# Patient Record
Sex: Female | Born: 1945 | Race: Black or African American | Hispanic: No | Smoking: Former smoker
Health system: Southern US, Community
[De-identification: ages and names within clinical notes are randomized; demographics above are authoritative.]

## PROBLEM LIST (undated history)

## (undated) DIAGNOSIS — H409 Unspecified glaucoma: Secondary | ICD-10-CM

## (undated) DIAGNOSIS — F419 Anxiety disorder, unspecified: Secondary | ICD-10-CM

## (undated) DIAGNOSIS — E785 Hyperlipidemia, unspecified: Secondary | ICD-10-CM

## (undated) DIAGNOSIS — G4730 Sleep apnea, unspecified: Secondary | ICD-10-CM

## (undated) DIAGNOSIS — K219 Gastro-esophageal reflux disease without esophagitis: Secondary | ICD-10-CM

## (undated) DIAGNOSIS — E079 Disorder of thyroid, unspecified: Secondary | ICD-10-CM

## (undated) DIAGNOSIS — H269 Unspecified cataract: Secondary | ICD-10-CM

## (undated) DIAGNOSIS — Z803 Family history of malignant neoplasm of breast: Secondary | ICD-10-CM

## (undated) DIAGNOSIS — M1990 Unspecified osteoarthritis, unspecified site: Secondary | ICD-10-CM

## (undated) DIAGNOSIS — Z8042 Family history of malignant neoplasm of prostate: Secondary | ICD-10-CM

## (undated) DIAGNOSIS — D649 Anemia, unspecified: Secondary | ICD-10-CM

## (undated) DIAGNOSIS — E119 Type 2 diabetes mellitus without complications: Secondary | ICD-10-CM

## (undated) DIAGNOSIS — T7840XA Allergy, unspecified, initial encounter: Secondary | ICD-10-CM

## (undated) DIAGNOSIS — R7303 Prediabetes: Secondary | ICD-10-CM

## (undated) HISTORY — DX: Allergy, unspecified, initial encounter: T78.40XA

## (undated) HISTORY — DX: Family history of malignant neoplasm of prostate: Z80.42

## (undated) HISTORY — DX: Disorder of thyroid, unspecified: E07.9

## (undated) HISTORY — PX: EYE SURGERY: SHX253

## (undated) HISTORY — DX: Sleep apnea, unspecified: G47.30

## (undated) HISTORY — PX: ABDOMINAL HYSTERECTOMY: SHX81

## (undated) HISTORY — DX: Unspecified cataract: H26.9

## (undated) HISTORY — DX: Anxiety disorder, unspecified: F41.9

## (undated) HISTORY — PX: BREAST SURGERY: SHX581

## (undated) HISTORY — DX: Anemia, unspecified: D64.9

## (undated) HISTORY — PX: CHOLECYSTECTOMY: SHX55

## (undated) HISTORY — DX: Family history of malignant neoplasm of breast: Z80.3

## (undated) HISTORY — DX: Type 2 diabetes mellitus without complications: E11.9

## (undated) HISTORY — DX: Unspecified glaucoma: H40.9

## (undated) HISTORY — DX: Gastro-esophageal reflux disease without esophagitis: K21.9

## (undated) HISTORY — DX: Hyperlipidemia, unspecified: E78.5

---

## 1973-05-15 HISTORY — PX: TONSILLECTOMY: SUR1361

## 2009-05-15 HISTORY — PX: ESOPHAGOGASTRODUODENOSCOPY: SHX1529

## 2009-05-15 HISTORY — PX: COLONOSCOPY: SHX174

## 2011-01-20 ENCOUNTER — Encounter: Payer: Self-pay | Admitting: *Deleted

## 2011-01-20 ENCOUNTER — Other Ambulatory Visit: Payer: Self-pay

## 2011-01-20 ENCOUNTER — Emergency Department (HOSPITAL_COMMUNITY)
Admission: EM | Admit: 2011-01-20 | Discharge: 2011-01-20 | Disposition: A | Discharge status: Home / Self Care | Payer: Federal, State, Local not specified - PPO | Attending: Emergency Medicine | Admitting: Emergency Medicine

## 2011-01-20 ENCOUNTER — Emergency Department (HOSPITAL_COMMUNITY): Payer: Federal, State, Local not specified - PPO

## 2011-01-20 DIAGNOSIS — IMO0002 Reserved for concepts with insufficient information to code with codable children: Secondary | ICD-10-CM | POA: Insufficient documentation

## 2011-01-20 DIAGNOSIS — Y92009 Unspecified place in unspecified non-institutional (private) residence as the place of occurrence of the external cause: Secondary | ICD-10-CM | POA: Insufficient documentation

## 2011-01-20 DIAGNOSIS — I498 Other specified cardiac arrhythmias: Secondary | ICD-10-CM | POA: Insufficient documentation

## 2011-01-20 DIAGNOSIS — S20219A Contusion of unspecified front wall of thorax, initial encounter: Secondary | ICD-10-CM | POA: Insufficient documentation

## 2011-01-20 MED ORDER — NAPROXEN 500 MG PO TABS: 500.0000 mg | ORAL_TABLET | Freq: Two times a day (BID) | ORAL | Status: AC

## 2011-01-20 MED ORDER — NAPROXEN 250 MG PO TABS
500.0000 mg | ORAL_TABLET | Freq: Once | ORAL | Status: AC
  Administered 2011-01-20: 500 mg via ORAL
  Filled 2011-01-20: qty 2

## 2011-01-20 NOTE — ED Notes (Signed)
States she accidentally ran into the deck on her riding lawnmower today, c/o pain in chest area

## 2011-01-20 NOTE — ED Provider Notes (Signed)
History   Scribed for Kara Bailiff, MD, the patient was seen in room APA19/APA19. This chart was scribed by Clarita Crane. This patient's care was started at 8:21PM.   CSN: [id number] Arrival date & time: 01/20/2011  7:45 PM  Chief Complaint  Patient presents with  . Chest Injury   HPI Kara [name] is a 65 y.o. female who presents to the Emergency Department complaining of constant chest pain described as soreness following an incident in which patient was mowing the lawn and accidentally ran into her deck causing her chest to strike an overhanging beam approximately 1-2 hours ago. Patient also notes swelling and pain to anterior neck although she denies a direct injury/blow to neck. Notes chest pain is aggravated with palpation and relieved by nothing. Denies SOB, head injury, posterior neck pain, LOC, HA, blurred vision.   HPI ELEMENTS: Location: parasternal region  Onset: 1-2 hours ago Duration: persistent since onset  Timing: constant   Quality:soreness  Modifying factors: aggravated by palpation. Relieved by nothing  Context:  as above  Associated symptoms:  +Throat swelling/pain Denies SOB, head injury, LOC, HA, blurred vision.   PAST MEDICAL HISTORY:  History reviewed. No pertinent past medical history.  PAST SURGICAL HISTORY:  Past Surgical History  Procedure Date  . Cesarean section   . Abdominal hysterectomy   . Cholecystectomy     MEDICATIONS:  Previous Medications   ESTRADIOL (VIVELLE-DOT) 0.0375 MG/24HR    Place 1 patch onto the skin 2 (two) times a week.     LANSOPRAZOLE (PREVACID) 15 MG CAPSULE    Take 15 mg by mouth daily as needed. For relief    LEVOTHYROXINE (SYNTHROID, LEVOTHROID) 112 MCG TABLET    Take 112 mcg by mouth daily.       ALLERGIES:  Allergies as of 01/20/2011 - Review Complete 01/20/2011  Allergen Reaction Noted  . Other Anaphylaxis 01/20/2011     FAMILY HISTORY:  No family history on file.   SOCIAL HISTORY: History   Social History    . Marital Status: Single    Spouse Name: N/A    Number of Children: N/A  . Years of Education: N/A   Social History Main Topics  . Smoking status: None  . Smokeless tobacco: None  . Alcohol Use: No  . Drug Use:   . Sexually Active:    Other Topics Concern  . None   Social History Narrative  . None     Review of Systems 10 Systems reviewed and are negative for acute change except as noted in the HPI.  Physical Exam  BP 145/50  Pulse 120  Temp(Src) 97.8 F (36.6 C) (Oral)  Resp 20  Ht 5\' 6"  (1.676 m)  Wt 227 lb (102.967 kg)  BMI 36.64 kg/m2  SpO2 100%  Physical Exam  Nursing note and vitals reviewed. Constitutional: She is oriented to person, place, and time. She appears well-developed and well-nourished. No distress.  HENT:  Head: Normocephalic and atraumatic.  Eyes: EOM are normal.  Neck: Normal range of motion. Neck supple.       Anterior soft tissue tenderness.   Cardiovascular: Normal rate and regular rhythm.  Exam reveals no gallop and no friction rub.   No murmur heard. Pulmonary/Chest: Effort normal and breath sounds normal. She has no wheezes. She exhibits tenderness (upper chest wall anteriorly and parasternal).  Abdominal: Soft. Bowel sounds are normal. She exhibits no distension. There is no tenderness.  Musculoskeletal: Normal range of motion. She exhibits no edema.  No C-spine tenderness.   Neurological: She is alert and oriented to person, place, and time. No sensory deficit.  Skin: Skin is warm and dry.  Psychiatric: She has a normal mood and affect. Her behavior is normal.    ED Course  Procedures  OTHER DATA REVIEWED: Nursing notes, vital signs, and past medical records reviewed. Lab results reviewed and considered Imaging results reviewed and considered  DIAGNOSTIC STUDIES: Oxygen Saturation is 100% on room air, normal by my interpretation.    LABS / RADIOLOGY: No results found for this or any previous visit. Dg Neck Soft  Tissue  01/20/2011  *RADIOLOGY REPORT*  Clinical Data: Blunt injury, difficulty swallowing  NECK SOFT TISSUES - 1+ VIEW  Comparison: None.  Findings: The cervical vertebrae are straightened in alignment. There is anterior osteophyte formation at C4-5 and C5-6 levels.  No prevertebral soft tissue swelling is seen.  The epiglottis appears normal.  No air is noted within the prevertebral soft tissues.  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  No air in the soft tissues.  Original Report Authenticated By: Juline Patch, M.D.   Dg Chest 2 View  01/20/2011  *RADIOLOGY REPORT*  Clinical Data: Chest pain, shortness of breath  CHEST - 2 VIEW  Comparison: None.  Findings: The lungs are clear.  Mediastinal contours appear normal. The heart is within upper limits of normal.  No acute bony abnormality is seen.  Surgical clips are present in the right upper quadrant from prior cholecystectomy.  IMPRESSION: Borderline cardiomegaly.  No active lung disease.  Original Report Authenticated By: Juline Patch, M.D.    PROCEDURES:   Date: 01/20/2011  Rate: 114  Rhythm: sinus tachycardia  QRS Axis: normal  Intervals: normal  ST/T Wave abnormalities: normal  Conduction Disutrbances:none  Narrative Interpretation: tachy likely from pain  Old EKG Reviewed: none available  ED COURSE / COORDINATION OF CARE: Orders Placed This Encounter  Procedures  . DG Chest 2 View  . DG Neck Soft Tissue  9:24PM- Patient informed of imaging results and intent to d/c home. Patient agrees with plan set forth at this time.   MDM: Differential Diagnosis: chest wall contusion. Imaging was performed of the chest as well as the soft tissues of the lateral neck which were negative. She received a dose of anti-inflammatory medication and she drove here. She wishes to be discharged only on anti-inflammatory medications and she'll be driving back to her home town in [place]. Currently the  patient's initial tachycardia was secondary to pain and on reassessment normalized. She provided instructions for return to the emergency department. She will take Tylenol as well as Naprosyn for pain control. She is instructed to follow up with her primary care physician in one week and to apply ice 3 times daily for 20 minutes at a time.   PLAN: Discharge The patient is to return the emergency department if there is any worsening of symptoms. I have reviewed the discharge instructions with the patient/family  CONDITION ON DISCHARGE: Stable   MEDICATIONS GIVEN IN THE E.D.  Medications  levothyroxine (SYNTHROID, LEVOTHROID) 112 MCG tablet (not administered)  estradiol (VIVELLE-DOT) 0.0375 MG/24HR (not administered)  lansoprazole (PREVACID) 15 MG capsule (not administered)  naproxen (NAPROSYN) 500 MG tablet (not administered)  naproxen (NAPROSYN) tablet 500 mg (500 mg Oral Given 01/20/11 2053)      I personally performed the services described in this documentation, which was scribed in my presence. The recorded information has been reviewed and considered.  Kara Bailiff, MD 01/20/11 2159

## 2014-06-01 DIAGNOSIS — E119 Type 2 diabetes mellitus without complications: Secondary | ICD-10-CM | POA: Diagnosis not present

## 2014-06-01 DIAGNOSIS — E780 Pure hypercholesterolemia: Secondary | ICD-10-CM | POA: Diagnosis not present

## 2014-06-01 DIAGNOSIS — E559 Vitamin D deficiency, unspecified: Secondary | ICD-10-CM | POA: Diagnosis not present

## 2014-06-01 DIAGNOSIS — E890 Postprocedural hypothyroidism: Secondary | ICD-10-CM | POA: Diagnosis not present

## 2014-06-04 DIAGNOSIS — N3090 Cystitis, unspecified without hematuria: Secondary | ICD-10-CM | POA: Diagnosis not present

## 2014-06-04 DIAGNOSIS — R300 Dysuria: Secondary | ICD-10-CM | POA: Diagnosis not present

## 2014-06-04 DIAGNOSIS — N3941 Urge incontinence: Secondary | ICD-10-CM | POA: Diagnosis not present

## 2014-06-19 DIAGNOSIS — E1165 Type 2 diabetes mellitus with hyperglycemia: Secondary | ICD-10-CM | POA: Diagnosis not present

## 2014-06-19 DIAGNOSIS — E669 Obesity, unspecified: Secondary | ICD-10-CM | POA: Diagnosis not present

## 2014-06-19 DIAGNOSIS — E039 Hypothyroidism, unspecified: Secondary | ICD-10-CM | POA: Diagnosis not present

## 2014-06-19 DIAGNOSIS — K219 Gastro-esophageal reflux disease without esophagitis: Secondary | ICD-10-CM | POA: Diagnosis not present

## 2014-07-01 DIAGNOSIS — E559 Vitamin D deficiency, unspecified: Secondary | ICD-10-CM | POA: Diagnosis not present

## 2014-07-01 DIAGNOSIS — E780 Pure hypercholesterolemia: Secondary | ICD-10-CM | POA: Diagnosis not present

## 2014-07-01 DIAGNOSIS — E119 Type 2 diabetes mellitus without complications: Secondary | ICD-10-CM | POA: Diagnosis not present

## 2014-07-01 DIAGNOSIS — Z79899 Other long term (current) drug therapy: Secondary | ICD-10-CM | POA: Diagnosis not present

## 2014-07-01 DIAGNOSIS — E890 Postprocedural hypothyroidism: Secondary | ICD-10-CM | POA: Diagnosis not present

## 2014-07-03 DIAGNOSIS — E780 Pure hypercholesterolemia: Secondary | ICD-10-CM | POA: Diagnosis not present

## 2014-07-03 DIAGNOSIS — E559 Vitamin D deficiency, unspecified: Secondary | ICD-10-CM | POA: Diagnosis not present

## 2014-07-03 DIAGNOSIS — Z79899 Other long term (current) drug therapy: Secondary | ICD-10-CM | POA: Diagnosis not present

## 2014-07-03 DIAGNOSIS — E890 Postprocedural hypothyroidism: Secondary | ICD-10-CM | POA: Diagnosis not present

## 2014-07-03 DIAGNOSIS — E119 Type 2 diabetes mellitus without complications: Secondary | ICD-10-CM | POA: Diagnosis not present

## 2014-09-04 DIAGNOSIS — L821 Other seborrheic keratosis: Secondary | ICD-10-CM | POA: Diagnosis not present

## 2014-09-04 DIAGNOSIS — L820 Inflamed seborrheic keratosis: Secondary | ICD-10-CM | POA: Diagnosis not present

## 2014-09-30 DIAGNOSIS — E890 Postprocedural hypothyroidism: Secondary | ICD-10-CM | POA: Diagnosis not present

## 2014-09-30 DIAGNOSIS — E780 Pure hypercholesterolemia: Secondary | ICD-10-CM | POA: Diagnosis not present

## 2014-09-30 DIAGNOSIS — E119 Type 2 diabetes mellitus without complications: Secondary | ICD-10-CM | POA: Diagnosis not present

## 2014-10-02 DIAGNOSIS — Z79899 Other long term (current) drug therapy: Secondary | ICD-10-CM | POA: Diagnosis not present

## 2014-10-02 DIAGNOSIS — E559 Vitamin D deficiency, unspecified: Secondary | ICD-10-CM | POA: Diagnosis not present

## 2014-10-02 DIAGNOSIS — E780 Pure hypercholesterolemia: Secondary | ICD-10-CM | POA: Diagnosis not present

## 2014-10-02 DIAGNOSIS — E890 Postprocedural hypothyroidism: Secondary | ICD-10-CM | POA: Diagnosis not present

## 2014-10-02 DIAGNOSIS — E119 Type 2 diabetes mellitus without complications: Secondary | ICD-10-CM | POA: Diagnosis not present

## 2014-10-20 DIAGNOSIS — Z79899 Other long term (current) drug therapy: Secondary | ICD-10-CM | POA: Diagnosis not present

## 2014-10-20 DIAGNOSIS — E559 Vitamin D deficiency, unspecified: Secondary | ICD-10-CM | POA: Diagnosis not present

## 2014-10-20 DIAGNOSIS — E890 Postprocedural hypothyroidism: Secondary | ICD-10-CM | POA: Diagnosis not present

## 2014-10-20 DIAGNOSIS — E780 Pure hypercholesterolemia: Secondary | ICD-10-CM | POA: Diagnosis not present

## 2014-10-20 DIAGNOSIS — E119 Type 2 diabetes mellitus without complications: Secondary | ICD-10-CM | POA: Diagnosis not present

## 2014-10-27 DIAGNOSIS — E119 Type 2 diabetes mellitus without complications: Secondary | ICD-10-CM | POA: Diagnosis not present

## 2014-12-28 DIAGNOSIS — E119 Type 2 diabetes mellitus without complications: Secondary | ICD-10-CM | POA: Diagnosis not present

## 2014-12-30 DIAGNOSIS — E890 Postprocedural hypothyroidism: Secondary | ICD-10-CM | POA: Diagnosis not present

## 2014-12-30 DIAGNOSIS — E119 Type 2 diabetes mellitus without complications: Secondary | ICD-10-CM | POA: Diagnosis not present

## 2014-12-30 DIAGNOSIS — E780 Pure hypercholesterolemia: Secondary | ICD-10-CM | POA: Diagnosis not present

## 2015-01-01 DIAGNOSIS — E559 Vitamin D deficiency, unspecified: Secondary | ICD-10-CM | POA: Diagnosis not present

## 2015-01-01 DIAGNOSIS — E119 Type 2 diabetes mellitus without complications: Secondary | ICD-10-CM | POA: Diagnosis not present

## 2015-01-01 DIAGNOSIS — Z79899 Other long term (current) drug therapy: Secondary | ICD-10-CM | POA: Diagnosis not present

## 2015-01-01 DIAGNOSIS — E890 Postprocedural hypothyroidism: Secondary | ICD-10-CM | POA: Diagnosis not present

## 2015-01-01 DIAGNOSIS — E780 Pure hypercholesterolemia: Secondary | ICD-10-CM | POA: Diagnosis not present

## 2015-01-05 DIAGNOSIS — Z1231 Encounter for screening mammogram for malignant neoplasm of breast: Secondary | ICD-10-CM | POA: Diagnosis not present

## 2015-01-05 DIAGNOSIS — Z803 Family history of malignant neoplasm of breast: Secondary | ICD-10-CM | POA: Diagnosis not present

## 2015-01-06 DIAGNOSIS — M7730 Calcaneal spur, unspecified foot: Secondary | ICD-10-CM | POA: Diagnosis not present

## 2015-01-06 DIAGNOSIS — M722 Plantar fascial fibromatosis: Secondary | ICD-10-CM | POA: Diagnosis not present

## 2015-01-06 DIAGNOSIS — I890 Lymphedema, not elsewhere classified: Secondary | ICD-10-CM | POA: Diagnosis not present

## 2015-01-07 DIAGNOSIS — N952 Postmenopausal atrophic vaginitis: Secondary | ICD-10-CM | POA: Diagnosis not present

## 2015-01-08 DIAGNOSIS — Z780 Asymptomatic menopausal state: Secondary | ICD-10-CM | POA: Diagnosis not present

## 2015-02-15 ENCOUNTER — Emergency Department (HOSPITAL_COMMUNITY)
Admission: EM | Admit: 2015-02-15 | Discharge: 2015-02-15 | Disposition: A | Discharge status: Home / Self Care | Payer: Medicare Other | Attending: Emergency Medicine | Admitting: Emergency Medicine

## 2015-02-15 ENCOUNTER — Encounter (HOSPITAL_COMMUNITY): Payer: Self-pay | Admitting: Emergency Medicine

## 2015-02-15 DIAGNOSIS — Z79899 Other long term (current) drug therapy: Secondary | ICD-10-CM | POA: Insufficient documentation

## 2015-02-15 DIAGNOSIS — E119 Type 2 diabetes mellitus without complications: Secondary | ICD-10-CM | POA: Diagnosis not present

## 2015-02-15 DIAGNOSIS — H15001 Unspecified scleritis, right eye: Secondary | ICD-10-CM | POA: Diagnosis not present

## 2015-02-15 DIAGNOSIS — H5711 Ocular pain, right eye: Secondary | ICD-10-CM | POA: Diagnosis present

## 2015-02-15 LAB — SEDIMENTATION RATE: Sed Rate: 11 mm/hr (ref 0–22)

## 2015-02-15 MED ORDER — FLUORESCEIN SODIUM 1 MG OP STRP
1.0000 | ORAL_STRIP | Freq: Once | OPHTHALMIC | Status: AC
  Administered 2015-02-15: 1 via OPHTHALMIC
  Filled 2015-02-15: qty 1

## 2015-02-15 MED ORDER — PREDNISOLONE ACETATE 1 % OP SUSP: 1.0000 [drp] | Freq: Four times a day (QID) | OPHTHALMIC | Status: AC

## 2015-02-15 MED ORDER — KETOROLAC TROMETHAMINE 0.5 % OP SOLN: 1.0000 [drp] | Freq: Four times a day (QID) | OPHTHALMIC | Status: DC

## 2015-02-15 MED ORDER — TETRACAINE HCL 0.5 % OP SOLN
2.0000 [drp] | Freq: Once | OPHTHALMIC | Status: AC
  Administered 2015-02-15: 2 [drp] via OPHTHALMIC
  Filled 2015-02-15: qty 2

## 2015-02-15 NOTE — ED Notes (Signed)
Pt states no blurry vision or visual distortion with acuity, just "watery eyes".

## 2015-02-15 NOTE — Discharge Instructions (Signed)
Scleritis and Episcleritis Follow up with Dr. Iona Hansen or your own eye doctor within 2 days.  Use the drops as prescribed. Return to the ED if you develop new or worsening symptoms. The outer part of the eyeball is covered with a tough fibrous covering called the sclera. It is the white part of the eye. This tough covering also has a thin membrane lying on top of it called the episclera.   When the sclera becomes red and sore (inflamed), it is called scleritis.  When the episclera becomes inflamed, it is called episcleritis. CAUSES   Scleritis is usually more severe and is associated with autoimmune diseases such as:  Rheumatoid arthritis.  Inflammations of the bowel such as Crohn's Disease (regional enteritis).  Ulcerative colitis.  Episcleritis usually has no known cause. SYMPTOMS  Both scleritis and episcleritis cause red patches or a nodule on the eye. DIAGNOSIS  This condition should be examined by an ophthalmologist. This is because very strong medications that have side effects to the body and eye may be required to treat severe attacks. Further investigations into the patient's general health may be necessary. TREATMENT   Episcleritis tends to get better without treatment within a week or two.  Scleritis is more severe. Often, your caregiver will prescribe steroids by mouth (orally) or as drops in the eye. This treatment helps lessen the redness and soreness (inflammation). HOME CARE INSTRUCTIONS   Take all medications as directed.  Keep your follow-up appointments as directed.  Avoid irritation of the involved eye(s).  Stop using hard or soft contact lenses until your caregiver tells you that it is safe to use them. SEEK MEDICAL CARE IF:   Redness or irritation gets worse.  You develop pain or sensitivity to light.  You develop any change in vision in the involved eye(s). Document Released: 04/25/2001 Document Revised: 07/24/2011 Document Reviewed:  08/27/2008 [facility] Patient Information 2015 [place], [place]. This information is not intended to replace advice given to you by your health care provider. Make sure you discuss any questions you have with your health care provider.

## 2015-02-15 NOTE — ED Provider Notes (Signed)
CSN: [id number]     Arrival date & time 02/15/15  8413 History   First MD Initiated Contact with Patient 02/15/15 0446     Chief Complaint  Patient presents with  . Eye Drainage     (Consider location/radiation/quality/duration/timing/severity/associated sxs/prior Treatment) HPI Comments: Right eye pain and swelling with clear drainage is worsening since yesterday. Denies trauma. States she's had sinus problems for the past week the pain to the right eye started this morning across her entire right side of her face reading to her teeth. Denies any blurry vision or double vision and has had watery eyes. She is glasses but no contacts. No fever. No history of glaucoma but does have cataracts. Denies any cough or runny nose. Denies any headache. No chest pain or shortness of breath. Denies any foreign body sensation.  The history is provided by the patient.    History reviewed. No pertinent past medical history. Past Surgical History  Procedure Laterality Date  . Cesarean section    . Abdominal hysterectomy    . Cholecystectomy     History reviewed. No pertinent family history. Social History  Substance Use Topics  . Smoking status: Never Smoker   . Smokeless tobacco: None  . Alcohol Use: No   OB History    No data available     Review of Systems  Constitutional: Negative for fever, activity change and appetite change.  HENT: Negative for congestion and rhinorrhea.   Eyes: Positive for pain, discharge and redness. Negative for photophobia and visual disturbance.  Respiratory: Negative for cough, chest tightness and shortness of breath.   Cardiovascular: Negative for chest pain, palpitations and leg swelling.  Gastrointestinal: Negative for nausea, vomiting and abdominal pain.  Genitourinary: Negative for dysuria and hematuria.  Musculoskeletal: Negative for myalgias and arthralgias.  Skin: Negative for rash.  Neurological: Negative for dizziness and headaches.  A complete 10  system review of systems was obtained and all systems are negative except as noted in the HPI and PMH.      Allergies  Other  Home Medications   Prior to Admission medications   Medication Sig Start Date End Date Taking? Authorizing Provider  estradiol (VIVELLE-DOT) 0.0375 MG/24HR Place 1 patch onto the skin 2 (two) times a week.      Historical Provider, MD  ketorolac (ACULAR) 0.5 % ophthalmic solution Place 1 drop into the right eye every 6 (six) hours. 02/15/15   Ezequiel Essex, MD  lansoprazole (PREVACID) 15 MG capsule Take 15 mg by mouth daily as needed. For relief     Historical Provider, MD  levothyroxine (SYNTHROID, LEVOTHROID) 112 MCG tablet Take 112 mcg by mouth daily.      Historical Provider, MD  prednisoLONE acetate (PRED FORTE) 1 % ophthalmic suspension Place 1 drop into the right eye 4 (four) times daily. 02/15/15 02/17/15  Ezequiel Essex, MD   BP 136/62 mmHg  Pulse 75  Temp(Src) 97.7 F (36.5 C) (Oral)  Resp 16  Ht _0  (1.676 m)  Wt 203 lb (92.08 kg)  BMI 32.78 kg/m2  SpO2 99% Physical Exam  Constitutional: She is oriented to person, place, and time. She appears well-developed and well-nourished. No distress.  HENT:  Head: Normocephalic and atraumatic.  Mouth/Throat: Oropharynx is clear and moist. No oropharyngeal exudate.  Eyes: EOM are normal. Pupils are equal, round, and reactive to light. No foreign body present in the right eye. Right conjunctiva is injected.  Slit lamp exam:      The right eye  shows no corneal abrasion, no corneal flare, no corneal ulcer, no foreign body, no hyphema, no hypopyon, no fluorescein uptake and no anterior chamber bulge.  Intraocular pressure 15 bilaterally. No temporal artery tenderness. Injection of conjunctiva and sclera. No hyphema. No hypopyon. No areas of fluoroscein uptake.  No rash  Neck: Normal range of motion. Neck supple.  No meningismus.  Cardiovascular: Normal rate, regular rhythm, normal heart sounds and  intact distal pulses.   No murmur heard. Pulmonary/Chest: Effort normal and breath sounds normal. No respiratory distress.  Abdominal: Soft. There is no tenderness. There is no rebound and no guarding.  Musculoskeletal: Normal range of motion. She exhibits no edema or tenderness.  Neurological: She is alert and oriented to person, place, and time. No cranial nerve deficit. She exhibits normal muscle tone. Coordination normal.  No ataxia on finger to nose bilaterally. No pronator drift. 5/5 strength throughout. CN 2-12 intact. Negative Romberg. Equal grip strength. Sensation intact. Gait is normal.   Skin: Skin is warm.  Psychiatric: She has a normal mood and affect. Her behavior is normal.  Nursing note and vitals reviewed.   ED Course  Procedures (including critical care time) Labs Review Labs Reviewed  SEDIMENTATION RATE    Imaging Review No results found. I have personally reviewed and evaluated these images and lab results as part of my medical decision-making.   EKG Interpretation None      MDM   Final diagnoses:  Scleritis, right   right eye redness and pain for the past 2 days. No trauma.  Intraocular pressure is normal. No areas of corneal abrasion or fluoroscein uptake. No evidence of glaucoma. ESR normal. Doubt temporal arteritis.  Visual Acuity - Bilateral Near: 20/25 ; Bilateral Distance: 20/25 ; R Near: 20/25 ; R Distance: 20/25 ; L Near: 20/25 ; L Distance: 20/25  Discussed with Dr. Iona Hansen. He agrees with empiric treatment for scleritis and episcleritis. There is also possibility this could represent early zoster but there is no rash visible there is no evidence of forcing uptake at this time. Patient to follow up with her ophthalmologist or Dr. Iona Hansen within the next 2 days. He recommends pred forte drops and ketorolac drops.  D/w Patient. She agrees to followup with ophthalmology.  Return to the ED if she develops vision change, rash around her eye,  worsening pain or any other concerns.   Ezequiel Essex, MD 02/15/15 1326

## 2015-02-15 NOTE — ED Notes (Signed)
Pt states she has had sinus drainage for a week or "so" and has had pain behind her eys, but woke up this morning with drainage to right eye.

## 2015-02-24 DIAGNOSIS — J069 Acute upper respiratory infection, unspecified: Secondary | ICD-10-CM | POA: Diagnosis not present

## 2015-02-24 DIAGNOSIS — J329 Chronic sinusitis, unspecified: Secondary | ICD-10-CM | POA: Diagnosis not present

## 2015-03-01 DIAGNOSIS — G7000 Myasthenia gravis without (acute) exacerbation: Secondary | ICD-10-CM | POA: Diagnosis not present

## 2015-03-01 DIAGNOSIS — H43811 Vitreous degeneration, right eye: Secondary | ICD-10-CM | POA: Diagnosis not present

## 2015-03-01 DIAGNOSIS — H02831 Dermatochalasis of right upper eyelid: Secondary | ICD-10-CM | POA: Diagnosis not present

## 2015-03-01 DIAGNOSIS — H02834 Dermatochalasis of left upper eyelid: Secondary | ICD-10-CM | POA: Diagnosis not present

## 2015-03-05 DIAGNOSIS — H0264 Xanthelasma of left upper eyelid: Secondary | ICD-10-CM | POA: Diagnosis not present

## 2015-03-05 DIAGNOSIS — H0261 Xanthelasma of right upper eyelid: Secondary | ICD-10-CM | POA: Diagnosis not present

## 2015-03-08 DIAGNOSIS — E119 Type 2 diabetes mellitus without complications: Secondary | ICD-10-CM | POA: Diagnosis not present

## 2015-03-17 DIAGNOSIS — E119 Type 2 diabetes mellitus without complications: Secondary | ICD-10-CM | POA: Diagnosis not present

## 2015-04-01 DIAGNOSIS — E890 Postprocedural hypothyroidism: Secondary | ICD-10-CM | POA: Diagnosis not present

## 2015-04-01 DIAGNOSIS — E7800 Pure hypercholesterolemia, unspecified: Secondary | ICD-10-CM | POA: Diagnosis not present

## 2015-04-01 DIAGNOSIS — E559 Vitamin D deficiency, unspecified: Secondary | ICD-10-CM | POA: Diagnosis not present

## 2015-04-01 DIAGNOSIS — E119 Type 2 diabetes mellitus without complications: Secondary | ICD-10-CM | POA: Diagnosis not present

## 2015-04-05 DIAGNOSIS — E119 Type 2 diabetes mellitus without complications: Secondary | ICD-10-CM | POA: Diagnosis not present

## 2015-04-05 DIAGNOSIS — E7800 Pure hypercholesterolemia, unspecified: Secondary | ICD-10-CM | POA: Diagnosis not present

## 2015-04-05 DIAGNOSIS — Z79899 Other long term (current) drug therapy: Secondary | ICD-10-CM | POA: Diagnosis not present

## 2015-04-05 DIAGNOSIS — E559 Vitamin D deficiency, unspecified: Secondary | ICD-10-CM | POA: Diagnosis not present

## 2015-04-05 DIAGNOSIS — E890 Postprocedural hypothyroidism: Secondary | ICD-10-CM | POA: Diagnosis not present

## 2015-04-05 DIAGNOSIS — Z23 Encounter for immunization: Secondary | ICD-10-CM | POA: Diagnosis not present

## 2015-04-12 DIAGNOSIS — E119 Type 2 diabetes mellitus without complications: Secondary | ICD-10-CM | POA: Diagnosis not present

## 2015-04-12 DIAGNOSIS — H02401 Unspecified ptosis of right eyelid: Secondary | ICD-10-CM | POA: Diagnosis not present

## 2015-04-12 DIAGNOSIS — H02831 Dermatochalasis of right upper eyelid: Secondary | ICD-10-CM | POA: Diagnosis not present

## 2015-04-12 DIAGNOSIS — H02834 Dermatochalasis of left upper eyelid: Secondary | ICD-10-CM | POA: Diagnosis not present

## 2015-06-08 DIAGNOSIS — H608X3 Other otitis externa, bilateral: Secondary | ICD-10-CM | POA: Diagnosis not present

## 2015-07-05 DIAGNOSIS — E119 Type 2 diabetes mellitus without complications: Secondary | ICD-10-CM | POA: Diagnosis not present

## 2015-07-05 DIAGNOSIS — E7800 Pure hypercholesterolemia, unspecified: Secondary | ICD-10-CM | POA: Diagnosis not present

## 2015-07-05 DIAGNOSIS — E890 Postprocedural hypothyroidism: Secondary | ICD-10-CM | POA: Diagnosis not present

## 2015-07-05 DIAGNOSIS — E559 Vitamin D deficiency, unspecified: Secondary | ICD-10-CM | POA: Diagnosis not present

## 2015-07-07 DIAGNOSIS — Z79899 Other long term (current) drug therapy: Secondary | ICD-10-CM | POA: Diagnosis not present

## 2015-07-07 DIAGNOSIS — E890 Postprocedural hypothyroidism: Secondary | ICD-10-CM | POA: Diagnosis not present

## 2015-07-07 DIAGNOSIS — E559 Vitamin D deficiency, unspecified: Secondary | ICD-10-CM | POA: Diagnosis not present

## 2015-07-07 DIAGNOSIS — E119 Type 2 diabetes mellitus without complications: Secondary | ICD-10-CM | POA: Diagnosis not present

## 2015-07-07 DIAGNOSIS — E7800 Pure hypercholesterolemia, unspecified: Secondary | ICD-10-CM | POA: Diagnosis not present

## 2015-09-01 DIAGNOSIS — S30860A Insect bite (nonvenomous) of lower back and pelvis, initial encounter: Secondary | ICD-10-CM | POA: Diagnosis not present

## 2015-09-01 DIAGNOSIS — T7840XA Allergy, unspecified, initial encounter: Secondary | ICD-10-CM | POA: Diagnosis not present

## 2015-10-07 DIAGNOSIS — H25013 Cortical age-related cataract, bilateral: Secondary | ICD-10-CM | POA: Diagnosis not present

## 2015-11-04 DIAGNOSIS — E7800 Pure hypercholesterolemia, unspecified: Secondary | ICD-10-CM | POA: Diagnosis not present

## 2015-11-04 DIAGNOSIS — E119 Type 2 diabetes mellitus without complications: Secondary | ICD-10-CM | POA: Diagnosis not present

## 2015-11-04 DIAGNOSIS — E559 Vitamin D deficiency, unspecified: Secondary | ICD-10-CM | POA: Diagnosis not present

## 2015-11-04 DIAGNOSIS — Z79899 Other long term (current) drug therapy: Secondary | ICD-10-CM | POA: Diagnosis not present

## 2015-11-04 DIAGNOSIS — R0600 Dyspnea, unspecified: Secondary | ICD-10-CM | POA: Diagnosis not present

## 2015-11-04 DIAGNOSIS — E890 Postprocedural hypothyroidism: Secondary | ICD-10-CM | POA: Diagnosis not present

## 2015-11-09 ENCOUNTER — Encounter: Payer: Self-pay | Admitting: Obstetrics and Gynecology

## 2015-11-09 ENCOUNTER — Ambulatory Visit (INDEPENDENT_AMBULATORY_CARE_PROVIDER_SITE_OTHER): Payer: Medicare Other | Admitting: Obstetrics and Gynecology

## 2015-11-09 VITALS — BP 106/62 | HR 116 | Ht 66.0 in | Wt 215.0 lb

## 2015-11-09 DIAGNOSIS — N952 Postmenopausal atrophic vaginitis: Secondary | ICD-10-CM

## 2015-11-09 HISTORY — DX: Postmenopausal atrophic vaginitis: N95.2

## 2015-11-09 MED ORDER — ESTRADIOL 0.025 MG/24HR TD PTTW: 1.0000 | MEDICATED_PATCH | TRANSDERMAL | Status: DC

## 2015-11-09 NOTE — Progress Notes (Signed)
   [facility] Visit  @DATE @            Patient name: Kara [name] MRN [id number]  Date of birth: 1946-02-09  CC & HPI:  Kara [name] is a 70 y.o. female presenting today for hormone tx refil.  ROS:  ROS   Pertinent History Reviewed:   Reviewed: Significant for s.p hyst. Medical         Past Medical History  Diagnosis Date  . Diabetes mellitus without complication [facility])                               Surgical Hx:    Past Surgical History  Procedure Laterality Date  . Cesarean section    . Abdominal hysterectomy    . Cholecystectomy     Medications: Reviewed & Updated - see associated section                       Current outpatient prescriptions:  .  EPINEPHrine 0.3 mg/0.3 mL IJ SOAJ injection, Inject 0.3 mg as directed as needed. , Disp: , Rfl:  .  ketorolac (ACULAR) 0.5 % ophthalmic solution, Place 1 drop into the right eye every 6 (six) hours., Disp: 3 mL, Rfl: 0 .  lansoprazole (PREVACID) 15 MG capsule, Take 15 mg by mouth daily as needed. For relief , Disp: , Rfl:  .  levothyroxine (SYNTHROID, LEVOTHROID) 112 MCG tablet, Take 112 mcg by mouth daily.  , Disp: , Rfl:  .  ONETOUCH VERIO test strip, 1 each 2 (two) times daily. , Disp: , Rfl:  .  estradiol (VIVELLE-DOT) 0.025 MG/24HR, Place 1 patch onto the skin 2 (two) times a week., Disp: 8 patch, Rfl: 12 .  metFORMIN (GLUCOPHAGE-XR) 500 MG 24 hr tablet, Take 500 mg by mouth daily with breakfast. , Disp: , Rfl:    Social History: Reviewed -  reports that she has quit smoking. She does not have any smokeless tobacco history on file.  Objective Findings:  Vitals: Blood pressure 106/62, pulse 116, height 5\' 6"  (1.676 m), weight 215 lb (97.523 kg).  Physical Examination: General appearance - alert, well appearing, and in no distress, oriented to person, place, and time, overweight and anxious Mental status - alert, oriented to person, place, and time, normal mood, behavior, speech, dress, motor activity, and  thought processes,  Abdomen - soft, nontender, nondistended, no masses or organomegaly Physical Examination: Pelvic - normal external genitalia, vulva, vagina, cervix, uterus and adnexa, VULVA: normal appearing vulva with no masses, tenderness or lesions Vag well supported  Assessment & Plan:   A:  1. 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  P:  1. .vivelle dot 0.25

## 2015-11-10 ENCOUNTER — Telehealth: Payer: Self-pay | Admitting: Obstetrics and Gynecology

## 2015-12-13 ENCOUNTER — Other Ambulatory Visit: Payer: Self-pay | Admitting: Obstetrics and Gynecology

## 2015-12-13 DIAGNOSIS — Z780 Asymptomatic menopausal state: Secondary | ICD-10-CM

## 2015-12-13 MED ORDER — ESTRADIOL 0.0375 MG/24HR TD PTTW: 1.0000 | MEDICATED_PATCH | TRANSDERMAL | 12 refills | Status: DC

## 2016-01-05 DIAGNOSIS — E785 Hyperlipidemia, unspecified: Secondary | ICD-10-CM | POA: Diagnosis not present

## 2016-01-05 DIAGNOSIS — Z6835 Body mass index (BMI) 35.0-35.9, adult: Secondary | ICD-10-CM | POA: Diagnosis not present

## 2016-01-05 DIAGNOSIS — E119 Type 2 diabetes mellitus without complications: Secondary | ICD-10-CM | POA: Diagnosis not present

## 2016-01-05 DIAGNOSIS — R609 Edema, unspecified: Secondary | ICD-10-CM | POA: Diagnosis not present

## 2016-01-06 ENCOUNTER — Other Ambulatory Visit: Payer: Self-pay | Admitting: Family Medicine

## 2016-01-06 DIAGNOSIS — Z1231 Encounter for screening mammogram for malignant neoplasm of breast: Secondary | ICD-10-CM

## 2016-02-08 ENCOUNTER — Ambulatory Visit (INDEPENDENT_AMBULATORY_CARE_PROVIDER_SITE_OTHER): Payer: Medicare Other | Admitting: Obstetrics and Gynecology

## 2016-02-08 ENCOUNTER — Encounter: Payer: Self-pay | Admitting: Obstetrics and Gynecology

## 2016-02-08 DIAGNOSIS — N951 Menopausal and female climacteric states: Secondary | ICD-10-CM

## 2016-02-08 MED ORDER — ESTRADIOL 0.0375 MG/24HR TD PTTW: 1.0000 | MEDICATED_PATCH | TRANSDERMAL | 12 refills | Status: DC

## 2016-02-08 NOTE — Progress Notes (Signed)
[facility] [facility] Visit  02/08/16           Patient name: Kara [name] MRN [id number]  Date of birth: 09/11/1945  CC & HPI:  Kara [name] is a 70 y.o. female presenting today to discuss hormone medication. Patient was seen in the office on 10/2015 and was started on Vivelle Dot 0.25mg  which she says isn't enough for her. She states the lower dose causes her to feel mentally drained.  She states she would like to increase her dosage.   ROS:  ROS A complete 10 system review of systems was obtained and all systems are negative except as noted in the HPI and PMH.    Pertinent History Reviewed:   Reviewed: Significant for abdominal hysterectomy  Medical         Past Medical History:  Diagnosis Date  . Diabetes mellitus without complication [facility])                               Surgical Hx:    Past Surgical History:  Procedure Laterality Date  . ABDOMINAL HYSTERECTOMY    . CESAREAN SECTION    . CHOLECYSTECTOMY     Medications: Reviewed & Updated - see associated section                       Current Outpatient Prescriptions:  .  EPINEPHrine 0.3 mg/0.3 mL IJ SOAJ injection, Inject 0.3 mg as directed as needed. , Disp: , Rfl:  .  estradiol (VIVELLE-DOT) 0.0375 MG/24HR, Place 1 patch onto the skin 2 (two) times a week., Disp: 8 patch, Rfl: 12 .  ketorolac (ACULAR) 0.5 % ophthalmic solution, Place 1 drop into the right eye every 6 (six) hours., Disp: 3 mL, Rfl: 0 .  lansoprazole (PREVACID) 15 MG capsule, Take 15 mg by mouth daily as needed. For relief , Disp: , Rfl:  .  levothyroxine (SYNTHROID, LEVOTHROID) 112 MCG tablet, Take 112 mcg by mouth daily.  , Disp: , Rfl:  .  metFORMIN (GLUCOPHAGE-XR) 500 MG 24 hr tablet, Take 500 mg by mouth daily with breakfast. , Disp: , Rfl:  .  ONETOUCH VERIO test strip, 1 each 2 (two) times daily. , Disp: , Rfl:    Social History: Reviewed -  reports that she has quit smoking. She has never used smokeless tobacco.  Objective Findings:  Vitals:  Blood pressure 112/74, pulse 90, height 5\' 6"  (1.676 m), weight 216 lb 3.2 oz (98.1 kg).  Physical Examination: General appearance - alert, well appearing, and in no distress and oriented to person, place, and time Mental status - alert, oriented to person, place, and time, normal mood, behavior, speech, dress, motor activity, and thought processes Pelvic - examination not indicated  The provider spent over 25 minutes with the visit , including previsit review, and documentation,with >than 50% spent in counseling and coordination of care.   Assessment & Plan:   A:  1. Postmenopausal vasomotor symptoms, primarily emotional component   P:  1. Increased dose to vivelle dot 0.375    By signing my name below, I, Sonum Patel, attest that this documentation has been prepared under the direction and in the presence of Jonnie Kind, MD. Electronically Signed: Sonum Patel, Education administrator. 02/08/16. 2:27 PM.  .jfs I personally performed the services described in this documentation, which was SCRIBED in my presence. The recorded information has been reviewed and considered accurate. It  has been edited as necessary during review. Jonnie Kind, MD

## 2016-02-09 ENCOUNTER — Ambulatory Visit
Admission: RE | Admit: 2016-02-09 | Discharge: 2016-02-09 | Disposition: A | Discharge status: Home / Self Care | Payer: Medicare Other | Source: Ambulatory Visit | Attending: Family Medicine | Admitting: Family Medicine

## 2016-02-09 DIAGNOSIS — E119 Type 2 diabetes mellitus without complications: Secondary | ICD-10-CM | POA: Diagnosis not present

## 2016-02-09 DIAGNOSIS — Z1231 Encounter for screening mammogram for malignant neoplasm of breast: Secondary | ICD-10-CM

## 2016-02-09 DIAGNOSIS — E890 Postprocedural hypothyroidism: Secondary | ICD-10-CM | POA: Diagnosis not present

## 2016-02-09 DIAGNOSIS — E559 Vitamin D deficiency, unspecified: Secondary | ICD-10-CM | POA: Diagnosis not present

## 2016-02-09 DIAGNOSIS — E7800 Pure hypercholesterolemia, unspecified: Secondary | ICD-10-CM | POA: Diagnosis not present

## 2016-02-11 ENCOUNTER — Ambulatory Visit: Payer: Federal, State, Local not specified - PPO

## 2016-02-11 DIAGNOSIS — N951 Menopausal and female climacteric states: Secondary | ICD-10-CM | POA: Insufficient documentation

## 2016-02-11 HISTORY — DX: Menopausal and female climacteric states: N95.1

## 2016-02-14 DIAGNOSIS — E559 Vitamin D deficiency, unspecified: Secondary | ICD-10-CM | POA: Diagnosis not present

## 2016-02-14 DIAGNOSIS — E890 Postprocedural hypothyroidism: Secondary | ICD-10-CM | POA: Diagnosis not present

## 2016-02-14 DIAGNOSIS — R0600 Dyspnea, unspecified: Secondary | ICD-10-CM | POA: Diagnosis not present

## 2016-02-14 DIAGNOSIS — Z23 Encounter for immunization: Secondary | ICD-10-CM | POA: Diagnosis not present

## 2016-02-14 DIAGNOSIS — E7800 Pure hypercholesterolemia, unspecified: Secondary | ICD-10-CM | POA: Diagnosis not present

## 2016-02-14 DIAGNOSIS — E119 Type 2 diabetes mellitus without complications: Secondary | ICD-10-CM | POA: Diagnosis not present

## 2016-02-14 DIAGNOSIS — R609 Edema, unspecified: Secondary | ICD-10-CM | POA: Diagnosis not present

## 2016-02-14 DIAGNOSIS — Z79899 Other long term (current) drug therapy: Secondary | ICD-10-CM | POA: Diagnosis not present

## 2016-07-05 ENCOUNTER — Ambulatory Visit
Admission: RE | Admit: 2016-07-05 | Discharge: 2016-07-05 | Disposition: A | Discharge status: Home / Self Care | Payer: Federal, State, Local not specified - PPO | Source: Ambulatory Visit | Attending: Family Medicine | Admitting: Family Medicine

## 2016-07-05 ENCOUNTER — Other Ambulatory Visit: Payer: Self-pay | Admitting: Family Medicine

## 2016-07-05 DIAGNOSIS — R0789 Other chest pain: Secondary | ICD-10-CM | POA: Diagnosis not present

## 2016-07-05 DIAGNOSIS — R0781 Pleurodynia: Secondary | ICD-10-CM

## 2016-07-05 DIAGNOSIS — Z23 Encounter for immunization: Secondary | ICD-10-CM | POA: Diagnosis not present

## 2016-07-05 DIAGNOSIS — E039 Hypothyroidism, unspecified: Secondary | ICD-10-CM | POA: Diagnosis not present

## 2016-07-05 DIAGNOSIS — Z6835 Body mass index (BMI) 35.0-35.9, adult: Secondary | ICD-10-CM | POA: Diagnosis not present

## 2016-07-05 DIAGNOSIS — E785 Hyperlipidemia, unspecified: Secondary | ICD-10-CM | POA: Diagnosis not present

## 2016-07-05 DIAGNOSIS — Z79890 Hormone replacement therapy (postmenopausal): Secondary | ICD-10-CM | POA: Diagnosis not present

## 2016-08-09 DIAGNOSIS — E559 Vitamin D deficiency, unspecified: Secondary | ICD-10-CM | POA: Diagnosis not present

## 2016-08-09 DIAGNOSIS — E7800 Pure hypercholesterolemia, unspecified: Secondary | ICD-10-CM | POA: Diagnosis not present

## 2016-08-09 DIAGNOSIS — E119 Type 2 diabetes mellitus without complications: Secondary | ICD-10-CM | POA: Diagnosis not present

## 2016-08-09 DIAGNOSIS — E890 Postprocedural hypothyroidism: Secondary | ICD-10-CM | POA: Diagnosis not present

## 2016-08-14 DIAGNOSIS — E039 Hypothyroidism, unspecified: Secondary | ICD-10-CM

## 2016-08-14 DIAGNOSIS — K219 Gastro-esophageal reflux disease without esophagitis: Secondary | ICD-10-CM | POA: Insufficient documentation

## 2016-08-14 HISTORY — DX: Hypothyroidism, unspecified: E03.9

## 2016-08-15 ENCOUNTER — Ambulatory Visit: Payer: Federal, State, Local not specified - PPO | Admitting: Family Medicine

## 2016-08-23 ENCOUNTER — Encounter: Payer: Self-pay | Admitting: Family Medicine

## 2016-08-23 ENCOUNTER — Ambulatory Visit (INDEPENDENT_AMBULATORY_CARE_PROVIDER_SITE_OTHER): Payer: Medicare Other | Admitting: Family Medicine

## 2016-08-23 VITALS — BP 104/68 | HR 84 | Temp 96.5°F | Resp 16 | Ht 66.0 in | Wt 219.0 lb

## 2016-08-23 DIAGNOSIS — Z809 Family history of malignant neoplasm, unspecified: Secondary | ICD-10-CM

## 2016-08-23 DIAGNOSIS — IMO0001 Reserved for inherently not codable concepts without codable children: Secondary | ICD-10-CM

## 2016-08-23 DIAGNOSIS — E785 Hyperlipidemia, unspecified: Secondary | ICD-10-CM

## 2016-08-23 DIAGNOSIS — K635 Polyp of colon: Secondary | ICD-10-CM | POA: Insufficient documentation

## 2016-08-23 DIAGNOSIS — E119 Type 2 diabetes mellitus without complications: Secondary | ICD-10-CM

## 2016-08-23 DIAGNOSIS — E669 Obesity, unspecified: Secondary | ICD-10-CM | POA: Diagnosis not present

## 2016-08-23 DIAGNOSIS — Z6835 Body mass index (BMI) 35.0-35.9, adult: Secondary | ICD-10-CM | POA: Insufficient documentation

## 2016-08-23 DIAGNOSIS — E661 Drug-induced obesity: Secondary | ICD-10-CM | POA: Insufficient documentation

## 2016-08-23 DIAGNOSIS — E782 Mixed hyperlipidemia: Secondary | ICD-10-CM | POA: Insufficient documentation

## 2016-08-23 HISTORY — DX: Hyperlipidemia, unspecified: E78.5

## 2016-08-23 HISTORY — DX: Polyp of colon: K63.5

## 2016-08-23 HISTORY — DX: Family history of malignant neoplasm, unspecified: Z80.9

## 2016-08-23 NOTE — Patient Instructions (Signed)
Stop the metformin Be more careful with your diet Walk every day that you are able See the diabetes educator  We will set up a consult for colon cancer screening DEXA and mammo not due till Sept  See me in 3 months Need blood work next time

## 2016-08-23 NOTE — Progress Notes (Signed)
Chief Complaint  Patient presents with  . Establish Care   New to establish No old records Has seen endocrinologist in [place] is up to date with health maintenance except for needed repeat colonoscopy Recent labs reviewed her Hemoglobin A1c is 5.6 She states her A1c was 5.9 when diagnosed with diabetes and placed on medication. We discussed that she may be able to manage her sugar with lifestyle and I recommend she go to a diabetes educator and assertively try to modify diet and exercise to get off of meds. Her LDL is 113, never has been on a statin. Her thyroid is over corrected.  She tells me she is taking more than her last doctor recommended because she felt better.  I lt her know that over correction is dangerous with health consequences and she will reduce dose She has an epi pen for food allergies  Patient Active Problem List   Diagnosis Date Noted  . Colon polyps 08/23/2016  . Family history of cancer 08/23/2016  . HLD (hyperlipidemia) 08/23/2016  . Obesity, Class II, BMI 35-39.9, with comorbidity 08/23/2016  . GERD (gastroesophageal reflux disease) 08/14/2016  . Hypothyroid 08/14/2016  . Type 2 diabetes mellitus without complication, without long-term current use of insulin ([place]) 08/14/2016  . Perimenopausal vasomotor symptoms 02/11/2016  . Postmenopausal atrophic vaginitis 11/09/2015    Outpatient Encounter Prescriptions as of 08/23/2016  Medication Sig  . EPINEPHrine 0.3 mg/0.3 mL IJ SOAJ injection Inject 0.3 mg as directed as needed.   Marland Kitchen estradiol (VIVELLE-DOT) 0.0375 MG/24HR Place 1 patch onto the skin 2 (two) times a week.  . lansoprazole (PREVACID) 15 MG capsule Take 15 mg by mouth daily as needed. For relief   . levothyroxine (SYNTHROID, LEVOTHROID) 112 MCG tablet Take 112 mcg by mouth daily.    . metFORMIN (GLUCOPHAGE-XR) 500 MG 24 hr tablet Take 500 mg by mouth daily with breakfast.   . ONETOUCH VERIO test strip 1 each 2 (two) times daily.    No  facility-administered encounter medications on file as of 08/23/2016.     Past Medical History:  Diagnosis Date  . Allergy    food allergies, drug allergies  . Anemia   . Anxiety   . Cataract   . Diabetes mellitus without complication ([place])   . GERD (gastroesophageal reflux disease)   . Hyperlipidemia   . Hypertension   . Thyroid disease     Past Surgical History:  Procedure Laterality Date  . ABDOMINAL HYSTERECTOMY     bleeding. fibroids  . CESAREAN SECTION    . CHOLECYSTECTOMY    . TONSILLECTOMY  1975    Social History   Social History  . Marital status: Divorced    Spouse name: N/A  . Number of children: 2  . Years of education: 15   Occupational History  . retired     Environmental health practitioner at [employer]   Social History Main Topics  . Smoking status: Former Research scientist (life sciences)  . Smokeless tobacco: Never Used     Comment: quit 2002  . Alcohol use No  . Drug use: No  . Sexual activity: Not Currently   Other Topics Concern  . 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 Cancer Maternal Grandmother  breast  . Cancer Maternal Grandfather   . Cancer Paternal Grandmother   . Cancer Paternal Grandfather   . Arthritis Mother   . Cancer Mother     breast  . COPD Mother   . Cancer Father     throat  . Stroke Father     Review of Systems  Constitutional: Negative for chills, fever and weight loss.  HENT: Negative for congestion and hearing loss.   Eyes: Negative for blurred vision and pain.  Respiratory: Negative for cough and shortness of breath.   Cardiovascular: Negative for chest pain and leg swelling.  Gastrointestinal: Negative for abdominal pain, constipation, diarrhea and heartburn.  Genitourinary: Negative for dysuria and frequency.  Musculoskeletal: Negative for falls, joint pain and  myalgias.  Neurological: Negative for dizziness, seizures and headaches.  Psychiatric/Behavioral: Negative for depression. The patient is not nervous/anxious and does not have insomnia.     BP 104/68 (BP Location: Right Arm, Patient Position: Sitting, Cuff Size: Large)   Pulse 84   Temp (!) 96.5 F (35.8 C) (Temporal)   Resp 16   Ht 5\' 6"  (1.676 m)   Wt 219 lb (99.3 kg)   SpO2 99%   BMI 35.35 kg/m   Physical Exam  Constitutional: She is oriented to person, place, and time. She appears well-developed and well-nourished.  HENT:  Head: Normocephalic and atraumatic.  Right Ear: External ear normal.  Left Ear: External ear normal.  Mouth/Throat: Oropharynx is clear and moist.  Eyes: Conjunctivae are normal. Pupils are equal, round, and reactive to light.  Neck: Normal range of motion. Neck supple. No thyromegaly present.  Cardiovascular: Normal rate, regular rhythm and normal heart sounds.   Pulmonary/Chest: Effort normal and breath sounds normal. No respiratory distress.  Abdominal: Soft. Bowel sounds are normal.  Musculoskeletal: Normal range of motion. She exhibits no edema.  Lymphadenopathy:    She has no cervical adenopathy.  Neurological: She is alert and oriented to person, place, and time.  Gait normal  Skin: Skin is warm and dry.  Psychiatric: She has a normal mood and affect. Her behavior is normal. Thought content normal.  Nursing note and vitals reviewed. ASSESSMENT/PLAN:   1. Polyp of colon, unspecified part of colon, unspecified type  - Ambulatory referral to Gastroenterology  2. Type 2 diabetes mellitus without complication, without long-term current use of insulin (H[facility])  - CBC - Hemoglobin A1c - Lipid panel - Urinalysis, Routine w reflex microscopic - Comprehensive metabolic panel - Ambulatory referral to diabetic education  3. Family history of cancer   4. Hyperlipidemia, unspecified hyperlipidemia type discussed diet and exercise  5. Obesity,  Class II, BMI 35-39.9, with comorbidity Goal is to reduce by 10 lbs in 3 months.  Diabetic, low fat diet, portion control, daily exercise   Patient Instructions  Stop the metformin Be more careful with your diet Walk every day that you are able See the diabetes educator  We will set up a consult for colon cancer screening DEXA and mammo not due till Sept  See me in 3 months Need blood work next time    Raylene Everts, MD

## 2016-09-04 ENCOUNTER — Encounter: Payer: Self-pay | Admitting: Internal Medicine

## 2016-09-11 ENCOUNTER — Encounter: Payer: Medicare Other | Attending: Family Medicine | Admitting: Nutrition

## 2016-09-11 VITALS — Ht 66.0 in | Wt 222.0 lb

## 2016-09-11 DIAGNOSIS — E119 Type 2 diabetes mellitus without complications: Secondary | ICD-10-CM

## 2016-09-11 DIAGNOSIS — Z713 Dietary counseling and surveillance: Secondary | ICD-10-CM | POA: Insufficient documentation

## 2016-09-11 NOTE — Patient Instructions (Signed)
Goals . Follow Plate Method Cut out diet sodas Eat meals on time Increase steps 6000 per day

## 2016-09-11 NOTE — Progress Notes (Signed)
Diabetes Self-Management Education  Visit Type: First/Initial  Appt. Start Time: 0800 Appt. End Time: 900  09/11/2016  [name], identified by name and date of birth, is a 71 y.o. female with a diagnosis of Diabetes: Type 2. LIves with her mom and cares for her. A1C 5.6%. No longer taking Metformin. Had lost about 30 lbs doing [employer] and only eating 20 grams of carbs per day and now has regained about 15 lbs. Walks for exercise. Has a fitbit and trying to get back to 10,000 steps. Only eating 2 meals per day. Sleeps in late.  Diet is inconsistent to meet her needs. She is motivated to make changes to eat better and still control her DM and work on weight loss  ASSESSMENT  Height 5\' 6"  (1.676 m), weight 222 lb (100.7 kg). Body mass index is 35.83 kg/m.      Diabetes Self-Management Education - 09/11/16 0758      Visit Information   Visit Type First/Initial     Initial Visit   Diabetes Type Type 2   Are you currently following a meal plan? No   Are you taking your medications as prescribed? Not on Medications   Date Diagnosed 2015     Health Coping   How would you rate your overall health? Good     Psychosocial Assessment   Patient Belief/Attitude about Diabetes Motivated to manage diabetes   Self-management support Family   Other persons present Patient   Patient Concerns Nutrition/Meal planning;Monitoring;Healthy Lifestyle;Problem Solving;Weight Control   Special Needs None   Preferred Learning Style No preference indicated   Learning Readiness Change in progress   How often do you need to have someone help you when you read instructions, pamphlets, or other written materials from your doctor or pharmacy? 1 - Never   What is the last grade level you completed in school? 12     Pre-Education Assessment   Patient understands the diabetes disease and treatment process. Needs Review   Patient understands incorporating nutritional management into lifestyle.  Needs Review   Patient undertands incorporating physical activity into lifestyle. Needs Review   Patient understands using medications safely. Needs Review   Patient understands monitoring blood glucose, interpreting and using results Needs Review   Patient understands prevention, detection, and treatment of acute complications. Needs Review   Patient understands prevention, detection, and treatment of chronic complications. Needs Review   Patient understands how to develop strategies to address psychosocial issues. Needs Review   Patient understands how to develop strategies to promote health/change behavior. Needs Review     Complications   Last HgB A1C per patient/outside source 5.6 %   How often do you check your blood sugar? 1-2 times/day   Have you had a dilated eye exam in the past 12 months? Yes   Have you had a dental exam in the past 12 months? Yes   Are you checking your feet? Yes   How many days per week are you checking your feet? 7     Dietary Intake   Breakfast Oatmeal, 1-2 sausage patty and 2 eggs,, coffee-non dairy creamer and splenda   Snack (morning) none   Lunch chicken sandwich-fried, [employer], Diet Coke   Dinner Spare ribs, BBQ sauce, potato salad,  Diet Coke 1/2 c ice cream   Snack (evening) 2 gam crackers,    Beverage(s) Diet Sodas,  water-1 bottle with lemon     Exercise   Exercise Type Light (walking / raking leaves)  How many days per week to you exercise? 30   How many minutes per day do you exercise? 5   Total minutes per week of exercise 150     Patient Education   Disease state  Definition of diabetes, type 1 and 2, and the diagnosis of diabetes;Factors that contribute to the development of diabetes;Explored patient's options for treatment of their diabetes   Nutrition management  Role of diet in the treatment of diabetes and the relationship between the three main macronutrients and blood glucose level;Food label reading, portion sizes and measuring  food.;Carbohydrate counting;Effects of alcohol on blood glucose and safety factors with consumption of alcohol.;Information on hints to eating out and maintain blood glucose control.;Meal options for control of blood glucose level and chronic complications.   Physical activity and exercise  Role of exercise on diabetes management, blood pressure control and cardiac health.;Identified with patient nutritional and/or medication changes necessary with exercise.;Helped patient identify appropriate exercises in relation to his/her diabetes, diabetes complications and other health issue.   Medications Taught/reviewed insulin injection, site rotation, insulin storage and needle disposal.;Reviewed patients medication for diabetes, action, purpose, timing of dose and side effects.;Reviewed medication adjustment guidelines for hyperglycemia and sick days.   Monitoring Taught/evaluated SMBG meter.;Purpose and frequency of SMBG.;Taught/discussed recording of test results and interpretation of SMBG.;Interpreting lab values - A1C, lipid, urine microalbumina.;Identified appropriate SMBG and/or A1C goals.   Acute complications Trained/discussed glucagon administration to patient and designated other.;Taught treatment of hypoglycemia - the 15 rule.;Discussed and identified patients' treatment of hyperglycemia.;Covered sick day management with medication and food.   Chronic complications Relationship between chronic complications and blood glucose control;Assessed and discussed foot care and prevention of foot problems;Lipid levels, blood glucose control and heart disease;Identified and discussed with patient  current chronic complications;Retinopathy and reason for yearly dilated eye exams;Nephropathy, what it is, prevention of, the use of ACE, ARB's and early detection of through urine microalbumia.;Reviewed with patient heart disease, higher risk of, and prevention   Psychosocial adjustment Worked with patient to identify  barriers to care and solutions;Role of stress on diabetes;Helped patient identify a support system for diabetes management;Travel strategies;Identified and addressed patients feelings and concerns about diabetes;Brainstormed with patient on coping mechanisms for social situations, getting support from significant others, dealing with feelings about diabetes   Personal strategies to promote health Lifestyle issues that need to be addressed for better diabetes care;Review risk of smoking and offered smoking cessation;Helped patient develop diabetes management plan for (enter comment)     Individualized Goals (developed by patient)   Nutrition Follow meal plan discussed;General guidelines for healthy choices and portions discussed;Adjust meds/carbs with exercise as discussed   Physical Activity Exercise 3-5 times per week;60 minutes per day   Medications Not Applicable   Monitoring  Not Applicable   Reducing Risk do foot checks daily;treat hypoglycemia with 15 grams of carbs if blood glucose less than 70mg /dL     Post-Education Assessment   Patient understands the diabetes disease and treatment process. Demonstrates understanding / competency   Patient understands incorporating nutritional management into lifestyle. Needs Review   Patient undertands incorporating physical activity into lifestyle. Needs Review   Patient understands using medications safely. Demonstrates understanding / competency   Patient understands monitoring blood glucose, interpreting and using results Demonstrates understanding / competency   Patient understands prevention, detection, and treatment of acute complications. Demonstrates understanding / competency   Patient understands prevention, detection, and treatment of chronic complications. Demonstrates understanding / competency   Patient understands how  to develop strategies to address psychosocial issues. Demonstrates understanding / competency   Patient understands how  to develop strategies to promote health/change behavior. Demonstrates understanding / competency     Outcomes   Expected Outcomes Demonstrated interest in learning. Expect positive outcomes   Future DMSE 4-6 wks   Program Status Completed      Individualized Plan for Diabetes Self-Management Training:   Learning Objective:  Patient will have a greater understanding of diabetes self-management. Patient education plan is to attend individual and/or group sessions per assessed needs and concerns.   Plan:   Patient Instructions  Goals . Follow Plate Method Cut out diet sodas Eat meals on time Increase steps 6000 per day    Expected Outcomes:  Demonstrated interest in learning. Expect positive outcomes  Education material provided:   If problems or questions, patient to contact team via:  Phone and Email  Future DSME appointment: 4-6 wks

## 2016-09-29 ENCOUNTER — Ambulatory Visit (INDEPENDENT_AMBULATORY_CARE_PROVIDER_SITE_OTHER): Payer: Medicare Other | Admitting: Gastroenterology

## 2016-09-29 ENCOUNTER — Encounter: Payer: Self-pay | Admitting: Gastroenterology

## 2016-09-29 VITALS — BP 107/64 | HR 85 | Temp 98.3°F | Ht 66.0 in | Wt 218.4 lb

## 2016-09-29 DIAGNOSIS — R1013 Epigastric pain: Secondary | ICD-10-CM | POA: Diagnosis not present

## 2016-09-29 DIAGNOSIS — K635 Polyp of colon: Secondary | ICD-10-CM

## 2016-09-29 HISTORY — DX: Epigastric pain: R10.13

## 2016-09-29 NOTE — Progress Notes (Signed)
Primary Care Physician:  Raylene Everts, MD Primary Gastroenterologist:  Dr. Oneida Alar   Chief Complaint  Patient presents with  . Colonoscopy    HPI:   Kara [name] is a 71 y.o. female presenting today at the request of Dr. Meda Coffee for surveillance colonoscopy.   Notes a history of polyps in [place] in 2011, and she states she was told to have surveillance in 5 years. Gastroenterologist: Dr. Genia Harold, phone # [phone]. [facility]. Notes upper abdomen and RUQ discomfort for many years, stating she had an EGD at some point. Feels better after taking Pepcid. Will at times take Prevacid for severe indigestion as needed. Does not take daily. Would like to be tested for H.pylori.  No N/V. No constipation or diarrhea. No rectal bleeding. No dysphagia.    Past Medical History:  Diagnosis Date  . Allergy    food allergies, drug allergies  . Anemia   . Anxiety   . Cataract   . Diabetes mellitus without complication (HCC)    no medication   . GERD (gastroesophageal reflux disease)   . Hyperlipidemia   . Thyroid disease     Past Surgical History:  Procedure Laterality Date  . ABDOMINAL HYSTERECTOMY     bleeding. fibroids  . CESAREAN SECTION    . CHOLECYSTECTOMY    . TONSILLECTOMY  1975    Current Outpatient Prescriptions  Medication Sig Dispense Refill  . EPINEPHrine 0.3 mg/0.3 mL IJ SOAJ injection Inject 0.3 mg as directed as needed.     Marland Kitchen estradiol (VIVELLE-DOT) 0.0375 MG/24HR Place 1 patch onto the skin 2 (two) times a week. 8 patch 12  . lansoprazole (PREVACID) 15 MG capsule Take 15 mg by mouth daily as needed. For relief     . levothyroxine (SYNTHROID, LEVOTHROID) 112 MCG tablet Take 112 mcg by mouth daily.      [name] VERIO test strip 1 each 2 (two) times daily.      No current facility-administered medications for this visit.     Allergies as of 09/29/2016 - Review Complete 09/29/2016  Allergen Reaction Noted  . Iodine Anaphylaxis  11/09/2015  . Other Anaphylaxis 01/20/2011  . Ibuprofen Other (See Comments) 11/09/2015  . Naproxen Other (See Comments) 11/09/2015  . Penicillins Swelling 11/09/2015    Family History  Problem Relation Age of Onset  . Cancer Maternal Grandmother        breast  . Cancer Maternal Grandfather   . Cancer Paternal Grandmother   . Cancer Paternal Grandfather   . Arthritis Mother   . Cancer Mother        breast  . COPD Mother   . Cancer Father        throat  . Stroke Father   . Colon cancer Neg Hx     Social History   Social History  . Marital status: Divorced    Spouse name: N/A  . Number of children: 2  . Years of education: 15   Occupational History  . retired     Environmental health practitioner at [employer]   Social History Main Topics  . Smoking status: Former Research scientist (life sciences)  . Smokeless tobacco: Never Used     Comment: quit 2002  . Alcohol use No  . Drug use: No  . Sexual activity: Not Currently   Other Topics Concern  . 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  CV: Denies chest pain, heart palpitations, peripheral edema, syncope.  Resp: Denies shortness of breath at rest or with exertion. Denies wheezing or cough.  GI: see HPI  GU : Denies urinary burning, urinary frequency, urinary hesitancy MS: Denies joint pain, muscle weakness, cramps, or limitation of movement.  Derm: Denies rash, itching, dry skin Psych: Denies depression, anxiety, memory loss, and confusion Heme: Denies bruising, bleeding, and enlarged lymph nodes.  Physical Exam: BP 107/64   Pulse 85   Temp 98.3 F (36.8 C) (Oral)   Ht 5\' 6"  (1.676 m)   Wt 218 lb 6.4 oz (99.1 kg)   BMI 35.25 kg/m  General:   Alert and oriented. Pleasant and cooperative. Well-nourished and well-developed.  Head:   Normocephalic and atraumatic. Eyes:  Without icterus, sclera clear and conjunctiva pink.  Ears:  Normal auditory acuity. Nose:  No deformity, discharge,  or lesions. Mouth:  No deformity or lesions, oral mucosa pink.  Lungs:  Clear to auscultation bilaterally. No wheezes, rales, or rhonchi. No distress.  Heart:  S1, S2 present without murmurs appreciated.  Abdomen:  +BS, soft, non-tender and non-distended. No HSM noted. No guarding or rebound. No masses appreciated.  Rectal:  Deferred  Msk:  Symmetrical without gross deformities. Normal posture. Extremities:  Without  edema. Neurologic:  Alert and  oriented x4 Psych:  Alert and cooperative. Normal mood and affect.

## 2016-09-29 NOTE — Patient Instructions (Addendum)
I will be getting the reports from the colonoscopy, and then we can schedule you!  Further recommendations to follow!

## 2016-09-29 NOTE — Assessment & Plan Note (Addendum)
71 year old female with history of colon polyps in 2011. I am requesting reports. No concerning lower GI symptoms. She would like to delay scheduling the colonoscopy until records can be reviewed by our office. Anticipate colonoscopy in very near future. We have requested records.   Received outside reports. Colonoscopy completed April 2011 with two 3-4 mm sessile rectal polyps (hyperplastic), few small and large-mouthed diverticula, TI appeared normal. Due for surveillance 2021. She will need ifobt now.

## 2016-09-29 NOTE — Assessment & Plan Note (Addendum)
Chronic. Likely uncontrolled GERD. She would like to be tested for H.pylori; I have ordered the urea breath test and instructed her to avoid Pepcid, Prevacid, and all other reflux medications for 14 days, then complete the test. We will obtain outside EGD reports from [place]. She would likely benefit from a daily PPI but may need EGD at time of TCS depending on when her last upper GI evaluation was completed. No alarm features.   EGD 2011: [place]: non-bleeding erosive gastropathy, normal duodenum. path: unremarkable duodenum, negative H.pylori, minimal esophagitis

## 2016-10-02 NOTE — Progress Notes (Signed)
cc'ed to pcp °

## 2016-10-03 ENCOUNTER — Encounter: Payer: Self-pay | Admitting: Gastroenterology

## 2016-10-04 ENCOUNTER — Telehealth: Payer: Self-pay | Admitting: Gastroenterology

## 2016-10-04 NOTE — Telephone Encounter (Signed)
Patient had hyperplastic polyps in 2011, 3-4 mm in size (two). She is due for colonoscopy in 2021. No FH of colon cancer. Would be best to obtain an ifobt now. She will be completing the urea breath test soon. We will see what this shows. Let's go ahead and get her back in to the office around 6 weeks, so we can follow-up on GERD and hopefully ifobt will be completed prior to this.

## 2016-10-10 NOTE — Telephone Encounter (Signed)
Tried to call, no one spoke. Will mail a letter for pt to call.

## 2016-10-11 ENCOUNTER — Ambulatory Visit: Payer: Medicare Other | Admitting: Nutrition

## 2016-10-12 ENCOUNTER — Other Ambulatory Visit: Payer: Self-pay

## 2016-10-12 MED ORDER — LEVOTHYROXINE SODIUM 112 MCG PO TABS: 112.0000 ug | ORAL_TABLET | Freq: Every day | ORAL | 3 refills | Status: DC

## 2016-10-17 DIAGNOSIS — R1013 Epigastric pain: Secondary | ICD-10-CM | POA: Diagnosis not present

## 2016-10-18 LAB — H. PYLORI BREATH TEST: H. PYLORI BREATH TEST: NOT DETECTED

## 2016-10-23 NOTE — Telephone Encounter (Addendum)
Pt called and is aware of the plan. Said she did the urea breath test last week. I will leave the iFOBT at the front desk and she will pick up and return. She has been scheduled an appt on 12/15/2016 at 10:30 am with Roseanne Kaufman, NP to follow up.

## 2016-10-23 NOTE — Telephone Encounter (Signed)
Great! H.pylori is negative.

## 2016-10-23 NOTE — Telephone Encounter (Signed)
LMOM to call.

## 2016-10-24 ENCOUNTER — Ambulatory Visit (INDEPENDENT_AMBULATORY_CARE_PROVIDER_SITE_OTHER): Payer: Medicare Other | Admitting: Gastroenterology

## 2016-10-24 DIAGNOSIS — K635 Polyp of colon: Secondary | ICD-10-CM | POA: Diagnosis not present

## 2016-10-24 LAB — IFOBT (OCCULT BLOOD): IFOBT: NEGATIVE

## 2016-10-24 NOTE — Telephone Encounter (Signed)
Pt is aware.  

## 2016-10-25 NOTE — Progress Notes (Signed)
ifobt is negative. I will see her in August.

## 2016-10-26 NOTE — Progress Notes (Signed)
PT is aware.

## 2016-11-20 DIAGNOSIS — H02841 Edema of right upper eyelid: Secondary | ICD-10-CM | POA: Diagnosis not present

## 2016-11-20 DIAGNOSIS — H01009 Unspecified blepharitis unspecified eye, unspecified eyelid: Secondary | ICD-10-CM | POA: Diagnosis not present

## 2016-11-24 DIAGNOSIS — E119 Type 2 diabetes mellitus without complications: Secondary | ICD-10-CM | POA: Diagnosis not present

## 2016-11-24 DIAGNOSIS — K635 Polyp of colon: Secondary | ICD-10-CM | POA: Diagnosis not present

## 2016-11-24 LAB — LIPID PANEL
CHOL/HDL RATIO: 3.6 ratio (ref <5.0)
CHOLESTEROL: 195 mg/dL (ref <200)
HDL: 54 mg/dL (ref >50)
LDL Cholesterol: 126 mg/dL — ABNORMAL HIGH (ref <100)
Triglycerides: 77 mg/dL (ref <150)
VLDL: 15 mg/dL (ref <30)

## 2016-11-24 LAB — URINALYSIS, ROUTINE W REFLEX MICROSCOPIC
Bilirubin Urine: NEGATIVE
Glucose, UA: NEGATIVE
HGB URINE DIPSTICK: NEGATIVE
KETONES UR: NEGATIVE
Leukocytes, UA: NEGATIVE
NITRITE: NEGATIVE
PH: 5 (ref 5.0–8.0)
Protein, ur: NEGATIVE
SPECIFIC GRAVITY, URINE: 1.016 (ref 1.001–1.035)

## 2016-11-24 LAB — CBC
HEMATOCRIT: 42.7 % (ref 35.0–45.0)
Hemoglobin: 13.8 g/dL (ref 11.7–15.5)
MCH: 26.5 pg — ABNORMAL LOW (ref 27.0–33.0)
MCHC: 32.3 g/dL (ref 32.0–36.0)
MCV: 82.1 fL (ref 80.0–100.0)
MPV: 10.6 fL (ref 7.5–12.5)
Platelets: 218 10*3/uL (ref 140–400)
RBC: 5.2 MIL/uL — ABNORMAL HIGH (ref 3.80–5.10)
RDW: 16.7 % — AB (ref 11.0–15.0)
WBC: 6.6 10*3/uL (ref 3.8–10.8)

## 2016-11-24 LAB — COMPREHENSIVE METABOLIC PANEL
ALBUMIN: 3.7 g/dL (ref 3.6–5.1)
ALT: 16 U/L (ref 6–29)
AST: 18 U/L (ref 10–35)
Alkaline Phosphatase: 106 U/L (ref 33–130)
BUN: 13 mg/dL (ref 7–25)
CALCIUM: 9.1 mg/dL (ref 8.6–10.4)
CHLORIDE: 105 mmol/L (ref 98–110)
CO2: 24 mmol/L (ref 20–31)
Creat: 0.88 mg/dL (ref 0.50–0.99)
Glucose, Bld: 102 mg/dL — ABNORMAL HIGH (ref 65–99)
Potassium: 4.4 mmol/L (ref 3.5–5.3)
Sodium: 138 mmol/L (ref 135–146)
Total Bilirubin: 0.5 mg/dL (ref 0.2–1.2)
Total Protein: 6.6 g/dL (ref 6.1–8.1)

## 2016-11-25 LAB — HEMOGLOBIN A1C
Hgb A1c MFr Bld: 5.7 % — ABNORMAL HIGH (ref <5.7)
MEAN PLASMA GLUCOSE: 117 mg/dL

## 2016-11-28 ENCOUNTER — Encounter: Payer: Self-pay | Admitting: Family Medicine

## 2016-11-28 ENCOUNTER — Ambulatory Visit (INDEPENDENT_AMBULATORY_CARE_PROVIDER_SITE_OTHER): Payer: Medicare Other | Admitting: Family Medicine

## 2016-11-28 VITALS — BP 110/60 | HR 88 | Temp 96.9°F | Resp 18 | Ht 66.0 in | Wt 221.1 lb

## 2016-11-28 DIAGNOSIS — E039 Hypothyroidism, unspecified: Secondary | ICD-10-CM

## 2016-11-28 DIAGNOSIS — E785 Hyperlipidemia, unspecified: Secondary | ICD-10-CM

## 2016-11-28 DIAGNOSIS — Z1231 Encounter for screening mammogram for malignant neoplasm of breast: Secondary | ICD-10-CM | POA: Diagnosis not present

## 2016-11-28 DIAGNOSIS — E119 Type 2 diabetes mellitus without complications: Secondary | ICD-10-CM

## 2016-11-28 DIAGNOSIS — Z1239 Encounter for other screening for malignant neoplasm of breast: Secondary | ICD-10-CM

## 2016-11-28 MED ORDER — EPINEPHRINE 0.3 MG/0.3ML IJ SOAJ: 0.3000 mg | INTRAMUSCULAR | 1 refills | Status: DC | PRN

## 2016-11-28 NOTE — Progress Notes (Signed)
Chief Complaint  Patient presents with  . Follow-up    3 month/ lab rev   At last visit we stopped metformin Reduced Thyroid Here for follow up Feels like she is gaining weight off the metformin Actually gained 3 lbs in 3 months Her A1c is good at 5.7 (off med) Recent labs from 7/13 reviewed Her LDL is elevated at 126.  Higher than desired in diabetic.  Refuses statin.  Allergic to fish ( oil).   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 Dyspepsia 09/29/2016  . Colon polyps 08/23/2016  . Family history of cancer 08/23/2016  . HLD (hyperlipidemia) 08/23/2016  . Obesity, Class II, BMI 35-39.9, with comorbidity 08/23/2016  . GERD (gastroesophageal reflux disease) 08/14/2016  . Hypothyroid 08/14/2016  . Type 2 diabetes mellitus without complication, without long-term current use of insulin ([place]) 08/14/2016  . Perimenopausal vasomotor symptoms 02/11/2016  . Postmenopausal atrophic vaginitis 11/09/2015    Outpatient Encounter Prescriptions as of 11/28/2016  Medication Sig  . EPINEPHrine 0.3 mg/0.3 mL IJ SOAJ injection Inject 0.3 mLs (0.3 mg total) into the skin as needed.  Marland Kitchen estradiol (VIVELLE-DOT) 0.0375 MG/24HR Place 1 patch onto the skin 2 (two) times a week.  . lansoprazole (PREVACID) 15 MG capsule Take 15 mg by mouth daily as needed. For relief   . levothyroxine (SYNTHROID, LEVOTHROID) 112 MCG tablet Take 1 tablet (112 mcg total) by mouth daily.  [name] VERIO test strip 1 each 2 (two) times daily.    No facility-administered encounter medications on file as of 11/28/2016.     Allergies  Allergen Reactions  . Iodine Anaphylaxis  . Other Anaphylaxis    Allergic to seafood and nuts  . Ibuprofen Other (See Comments)    Uncertain - headache  . Naproxen Other (See Comments)    headache  . Penicillins Swelling    Review of Systems  Constitutional: Positive for  fatigue and unexpected weight change. Negative for activity change and appetite change.  HENT: Negative for congestion, dental problem, postnasal drip and rhinorrhea.   Eyes: Negative for redness and visual disturbance.  Respiratory: Negative for cough and shortness of breath.   Cardiovascular: Negative for chest pain, palpitations and leg swelling.  Gastrointestinal: Negative for abdominal pain, constipation and diarrhea.  Genitourinary: Negative for difficulty urinating, frequency and menstrual problem.  Musculoskeletal: Negative for arthralgias and back pain.  Skin:       "hair falling out"  Neurological: Negative for dizziness and headaches.  Psychiatric/Behavioral: Positive for sleep disturbance. Negative for dysphoric mood. The patient is not nervous/anxious.     BP 110/60 (BP Location: Right Arm, Patient Position: Sitting, Cuff Size: Large)   Pulse 88   Temp (!) 96.9 F (36.1 C) (Temporal)   Resp 18   Ht 5\' 6"  (1.676 m)   Wt 221 lb 1.3 oz (100.3 kg)   SpO2 99%   BMI 35.68 kg/m   Physical Exam  Constitutional: She is oriented to person, place, and time. She appears well-developed and well-nourished.  Obese  HENT:  Head: Normocephalic and atraumatic.  Mouth/Throat: Oropharynx is clear and moist.  Eyes: Pupils are equal, round, and reactive to light. Conjunctivae are normal.  Neck: Normal range of motion. Neck supple. No thyromegaly present.  Cardiovascular: Normal rate, regular rhythm and normal heart sounds.   Pulmonary/Chest: Effort normal and breath sounds normal. No respiratory distress.  Abdominal: Soft. Bowel sounds are normal.  Musculoskeletal: Normal range of motion. She exhibits no edema.  Lymphadenopathy:    She has no cervical adenopathy.  Neurological: She is alert and oriented to person, place, and time.  Gait normal.  Knee reflex trace  Skin: Skin is warm and dry.  Psychiatric: She has a normal mood and affect. Her behavior is normal. Thought content  normal.  Nursing note and vitals reviewed.   ASSESSMENT/PLAN:  1. Hypothyroidism, unspecified type  - TSH  2. Type 2 diabetes mellitus without complication, without long-term current use of insulin (HCC) - CBC - COMPLETE METABOLIC PANEL WITH GFR - Hemoglobin A1c - Lipid panel - VITAMIN D 25 Hydroxy (Vit-D Deficiency, Fractures) - Microalbumin / creatinine urine ratio - Urinalysis, Routine w reflex microscopic -  3. Hyperlipidemia, unspecified hyperlipidemia type  4. Screening for breast cancer  - MM Digital Screening; Future - DEXA  Patient Instructions  Mammogram and dexa due in September Continue to eat well Stay as active as you can manage Need blood work and follow up in 3 months   Raylene Everts, MD

## 2016-11-28 NOTE — Patient Instructions (Signed)
Mammogram and dexa due in September Continue to eat well Stay as active as you can manage Need blood work and follow up in 3 months

## 2016-12-15 ENCOUNTER — Ambulatory Visit (INDEPENDENT_AMBULATORY_CARE_PROVIDER_SITE_OTHER): Payer: Medicare Other | Admitting: Family Medicine

## 2016-12-15 ENCOUNTER — Ambulatory Visit: Payer: Federal, State, Local not specified - PPO | Admitting: Gastroenterology

## 2016-12-15 ENCOUNTER — Encounter: Payer: Self-pay | Admitting: Family Medicine

## 2016-12-15 VITALS — BP 120/68 | HR 87 | Temp 98.5°F | Resp 16 | Ht 66.0 in | Wt 219.5 lb

## 2016-12-15 DIAGNOSIS — M545 Low back pain, unspecified: Secondary | ICD-10-CM

## 2016-12-15 DIAGNOSIS — E039 Hypothyroidism, unspecified: Secondary | ICD-10-CM | POA: Diagnosis not present

## 2016-12-15 DIAGNOSIS — R5383 Other fatigue: Secondary | ICD-10-CM

## 2016-12-15 LAB — POCT URINALYSIS DIPSTICK
Glucose, UA: NEGATIVE
KETONES UA: 15
Leukocytes, UA: NEGATIVE
Nitrite, UA: NEGATIVE
UROBILINOGEN UA: 0.2 U/dL
pH, UA: 5.5 (ref 5.0–8.0)

## 2016-12-15 MED ORDER — METFORMIN HCL ER 500 MG PO TB24: 500.0000 mg | ORAL_TABLET | Freq: Every day | ORAL | 3 refills | Status: DC

## 2016-12-15 NOTE — Progress Notes (Signed)
Chief Complaint  Patient presents with  . Back Pain  . Shortness of Breath   Called today for sick appointment She feels tired, short of breath, poor exercise tolerance.  Muscle pain in her low back.  Trouble getting out of bed today.  On one episode stood quickly and felt faint. Compliant with diet Is back on metformin.  "feels better" Due for TSH screening.  It was overlooked with recent labs an dis needed No specific symptoms such as cough, fever, headache, rash, Change in appetite or bowels   Patient Active Problem List   Diagnosis Date Noted  . Dyspepsia 09/29/2016  . Colon polyps 08/23/2016  . Family history of cancer 08/23/2016  . HLD (hyperlipidemia) 08/23/2016  . Obesity, Class II, BMI 35-39.9, with comorbidity 08/23/2016  . GERD (gastroesophageal reflux disease) 08/14/2016  . Hypothyroid 08/14/2016  . Type 2 diabetes mellitus without complication, without long-term current use of insulin ([place]) 08/14/2016  . Perimenopausal vasomotor symptoms 02/11/2016  . Postmenopausal atrophic vaginitis 11/09/2015    Outpatient Encounter Prescriptions as of 12/15/2016  Medication Sig  . EPINEPHrine 0.3 mg/0.3 mL IJ SOAJ injection Inject 0.3 mLs (0.3 mg total) into the skin as needed.  Marland Kitchen estradiol (VIVELLE-DOT) 0.0375 MG/24HR Place 1 patch onto the skin 2 (two) times a week.  . lansoprazole (PREVACID) 15 MG capsule Take 15 mg by mouth daily as needed. For relief   . levothyroxine (SYNTHROID, LEVOTHROID) 112 MCG tablet Take 1 tablet (112 mcg total) by mouth daily.  . metFORMIN (GLUCOPHAGE-XR) 500 MG 24 hr tablet Take 1 tablet (500 mg total) by mouth daily with breakfast.  . ONETOUCH VERIO test strip 1 each 2 (two) times daily.    No facility-administered encounter medications on file as of 12/15/2016.     Allergies  Allergen Reactions  . Iodine Anaphylaxis  . Other Anaphylaxis    Allergic to seafood and nuts  . Ibuprofen Other (See Comments)    Uncertain - headache  .  Naproxen Other (See Comments)    headache  . Penicillins Swelling    Review of Systems  Constitutional: Positive for fatigue. Negative for activity change, appetite change and unexpected weight change.  HENT: Negative for congestion, dental problem, postnasal drip and rhinorrhea.   Eyes: Negative for redness and visual disturbance.  Respiratory: Positive for shortness of breath. Negative for cough.   Cardiovascular: Negative for chest pain, palpitations and leg swelling.  Gastrointestinal: Negative for abdominal pain, constipation and diarrhea.  Genitourinary: Negative for difficulty urinating, frequency and menstrual problem.  Musculoskeletal: Negative for arthralgias and back pain.  Skin:       Dry skin, thin hair  Neurological: Negative for dizziness and headaches.  Psychiatric/Behavioral: Negative for dysphoric mood and sleep disturbance. The patient is not nervous/anxious.     BP 120/68 (BP Location: Right Arm, Patient Position: Sitting, Cuff Size: Normal)   Pulse 87   Temp 98.5 F (36.9 C) (Other (Comment))   Resp 16   Ht '5\' 6"'$  (1.676 m)   Wt 219 lb 8 oz (99.6 kg)   SpO2 98%   BMI 35.43 kg/m   Physical Exam  Constitutional: She is oriented to person, place, and time. She appears well-developed and well-nourished.  Looks tired  HENT:  Head: Normocephalic and atraumatic.  Right Ear: External ear normal.  Left Ear: External ear normal.  Mouth/Throat: Oropharynx is clear and moist.  Eyes: Pupils are equal, round, and reactive to light. Conjunctivae are normal.  Neck: Normal range of  motion. Neck supple. No thyromegaly present.  Cardiovascular: Normal rate, regular rhythm and normal heart sounds.   Pulmonary/Chest: Effort normal and breath sounds normal. No respiratory distress.  Abdominal: Soft. Bowel sounds are normal.  Musculoskeletal: Normal range of motion. She exhibits no edema.  Mild tenderness lumbar muscles  Lymphadenopathy:    She has no cervical adenopathy.    Neurological: She is alert and oriented to person, place, and time. She displays abnormal reflex.  Gait normal.  Reflexes dull  Skin: Skin is warm and dry.  Psychiatric: She has a normal mood and affect. Her behavior is normal. Thought content normal.  Nursing note and vitals reviewed.  Results for orders placed or performed in visit on 12/15/16  POCT Urinalysis Dipstick  Result Value Ref Range   Color, UA yellow    Clarity, UA clear    Glucose, UA neg    Bilirubin, UA sm    Ketones, UA 15    Spec Grav, UA >=1.030 (A) 1.010 - 1.025   Blood, UA trace-lysed    pH, UA 5.5 5.0 - 8.0   Protein, UA trace    Urobilinogen, UA 0.2 0.2 or 1.0 E.U./dL   Nitrite, UA neg    Leukocytes, UA Negative Negative    ASSESSMENT/PLAN:  1. Hypothyroidism, unspecified type  2. Other fatigue  - TSH - Sed Rate (ESR) - POCT Urinalysis Dipstick - BASIC METABOLIC PANEL WITH GFR  3. Acute midline low back pain without sciatica   Urine indicates dehydration.  Discussed.  Patient Instructions  Rest Fluids Call Monday for results    Raylene Everts, MD

## 2016-12-15 NOTE — Patient Instructions (Signed)
Rest Fluids Call Monday for results

## 2016-12-16 LAB — SEDIMENTATION RATE: Sed Rate: 10 mm/hr (ref 0–30)

## 2016-12-16 LAB — BASIC METABOLIC PANEL WITH GFR
BUN: 16 mg/dL (ref 7–25)
CALCIUM: 9.4 mg/dL (ref 8.6–10.4)
CHLORIDE: 104 mmol/L (ref 98–110)
CO2: 21 mmol/L (ref 20–31)
CREATININE: 0.98 mg/dL (ref 0.50–0.99)
GFR, EST AFRICAN AMERICAN: 68 mL/min (ref >=60)
GFR, EST NON AFRICAN AMERICAN: 59 mL/min — AB (ref >=60)
Glucose, Bld: 94 mg/dL (ref 65–99)
POTASSIUM: 4.6 mmol/L (ref 3.5–5.3)
SODIUM: 139 mmol/L (ref 135–146)

## 2016-12-16 LAB — TSH: TSH: 0.28 m[IU]/L — AB

## 2016-12-18 ENCOUNTER — Encounter: Payer: Self-pay | Admitting: Family Medicine

## 2016-12-20 ENCOUNTER — Telehealth: Payer: Self-pay | Admitting: *Deleted

## 2016-12-20 NOTE — Telephone Encounter (Signed)
Patient called to get her lab results. Please advise [phone]

## 2016-12-20 NOTE — Telephone Encounter (Signed)
I made patient aware her labs has been mailed out, patient would like to know if there is any change in her medication and what does Dr Meda Coffee recommend. Please advise

## 2016-12-20 NOTE — Telephone Encounter (Signed)
Left message for pt to call office

## 2016-12-21 NOTE — Telephone Encounter (Signed)
She is slightly over corrected - needs lower dose.  So don't take the extra doses

## 2016-12-21 NOTE — Telephone Encounter (Signed)
Called and spoke to Kara [name] about her labs. States she has been taking 112 mcg synthroid daily, and some extra a few days and feels better, is asking what dose she should now be taking based on her recent lab results.

## 2016-12-22 NOTE — Telephone Encounter (Signed)
Called pt, aware of md advise.

## 2016-12-22 NOTE — Telephone Encounter (Signed)
The 112 is fine for now, don't add the extra quarter pill.

## 2016-12-22 NOTE — Telephone Encounter (Signed)
Patient states she is taking 144mcg, no extra doses. She took 1/4 pill Thursday and Friday in addition to 127mcg pill. She states that she does not understand why she had the test and she had to change her medication, then change it again. She is taking 141mcg and feels great. She does not want to reduce dose again because then she will feel bad.

## 2016-12-26 ENCOUNTER — Other Ambulatory Visit: Payer: Self-pay | Admitting: *Deleted

## 2016-12-26 MED ORDER — ESTRADIOL 0.0375 MG/24HR TD PTTW: 1.0000 | MEDICATED_PATCH | TRANSDERMAL | 12 refills | Status: DC

## 2016-12-29 NOTE — Progress Notes (Signed)
REVIEWED. H PYLORI ERADICATED. NEXT TCS 2021.

## 2017-02-19 DIAGNOSIS — E785 Hyperlipidemia, unspecified: Secondary | ICD-10-CM | POA: Diagnosis not present

## 2017-02-19 DIAGNOSIS — E119 Type 2 diabetes mellitus without complications: Secondary | ICD-10-CM | POA: Diagnosis not present

## 2017-02-19 DIAGNOSIS — E039 Hypothyroidism, unspecified: Secondary | ICD-10-CM | POA: Diagnosis not present

## 2017-02-20 LAB — URINALYSIS, ROUTINE W REFLEX MICROSCOPIC
BILIRUBIN URINE: NEGATIVE
Glucose, UA: NEGATIVE
HGB URINE DIPSTICK: NEGATIVE
KETONES UR: NEGATIVE
Leukocytes, UA: NEGATIVE
Nitrite: NEGATIVE
PROTEIN: NEGATIVE
Specific Gravity, Urine: 1.024 (ref 1.001–1.03)
pH: <=5 (ref 5.0–8.0)

## 2017-02-20 LAB — COMPLETE METABOLIC PANEL WITH GFR
AG Ratio: 1.3 (calc) (ref 1.0–2.5)
ALT: 11 U/L (ref 6–29)
AST: 13 U/L (ref 10–35)
Albumin: 3.7 g/dL (ref 3.6–5.1)
Alkaline phosphatase (APISO): 94 U/L (ref 33–130)
BUN: 12 mg/dL (ref 7–25)
CHLORIDE: 107 mmol/L (ref 98–110)
CO2: 27 mmol/L (ref 20–32)
CREATININE: 0.81 mg/dL (ref 0.50–0.99)
Calcium: 9.2 mg/dL (ref 8.6–10.4)
GFR, EST AFRICAN AMERICAN: 86 mL/min/{1.73_m2} (ref >=60)
GFR, Est Non African American: 74 mL/min/{1.73_m2} (ref >=60)
GLUCOSE: 104 mg/dL — AB (ref 65–99)
Globulin: 2.9 g/dL (calc) (ref 1.9–3.7)
Potassium: 4.4 mmol/L (ref 3.5–5.3)
Sodium: 139 mmol/L (ref 135–146)
TOTAL PROTEIN: 6.6 g/dL (ref 6.1–8.1)
Total Bilirubin: 0.5 mg/dL (ref 0.2–1.2)

## 2017-02-20 LAB — CBC
HCT: 40 % (ref 35.0–45.0)
Hemoglobin: 13.1 g/dL (ref 11.7–15.5)
MCH: 26.2 pg — ABNORMAL LOW (ref 27.0–33.0)
MCHC: 32.8 g/dL (ref 32.0–36.0)
MCV: 80 fL (ref 80.0–100.0)
MPV: 11.7 fL (ref 7.5–12.5)
Platelets: 219 10*3/uL (ref 140–400)
RBC: 5 10*6/uL (ref 3.80–5.10)
RDW: 15.2 % — ABNORMAL HIGH (ref 11.0–15.0)
WBC: 6.1 10*3/uL (ref 3.8–10.8)

## 2017-02-20 LAB — HEMOGLOBIN A1C
EAG (MMOL/L): 6.8 (calc)
Hgb A1c MFr Bld: 5.9 % of total Hgb — ABNORMAL HIGH (ref <5.7)
Mean Plasma Glucose: 123 (calc)

## 2017-02-20 LAB — MICROALBUMIN / CREATININE URINE RATIO
Creatinine, Urine: 169 mg/dL (ref 20–275)
MICROALB/CREAT RATIO: 2 ug/mg{creat} (ref <30)
Microalb, Ur: 0.3 mg/dL

## 2017-02-20 LAB — LIPID PANEL
CHOL/HDL RATIO: 3.6 (calc) (ref <5.0)
Cholesterol: 193 mg/dL (ref <200)
HDL: 54 mg/dL (ref >50)
LDL Cholesterol (Calc): 122 mg/dL (calc) — ABNORMAL HIGH
NON-HDL CHOLESTEROL (CALC): 139 mg/dL — AB (ref <130)
TRIGLYCERIDES: 74 mg/dL (ref <150)

## 2017-02-20 LAB — VITAMIN D 25 HYDROXY (VIT D DEFICIENCY, FRACTURES): Vit D, 25-Hydroxy: 33 ng/mL (ref 30–100)

## 2017-02-20 LAB — TSH: TSH: 0.22 m[IU]/L — AB (ref 0.40–4.50)

## 2017-02-27 ENCOUNTER — Encounter: Payer: Self-pay | Admitting: Family Medicine

## 2017-02-28 ENCOUNTER — Telehealth: Payer: Self-pay | Admitting: Family Medicine

## 2017-02-28 ENCOUNTER — Encounter: Payer: Self-pay | Admitting: Family Medicine

## 2017-02-28 ENCOUNTER — Ambulatory Visit (INDEPENDENT_AMBULATORY_CARE_PROVIDER_SITE_OTHER): Payer: Medicare Other | Admitting: Family Medicine

## 2017-02-28 VITALS — BP 122/74 | HR 88 | Temp 96.7°F | Resp 18 | Ht 66.0 in | Wt 219.1 lb

## 2017-02-28 DIAGNOSIS — E119 Type 2 diabetes mellitus without complications: Secondary | ICD-10-CM

## 2017-02-28 DIAGNOSIS — Z780 Asymptomatic menopausal state: Secondary | ICD-10-CM | POA: Diagnosis not present

## 2017-02-28 DIAGNOSIS — E785 Hyperlipidemia, unspecified: Secondary | ICD-10-CM | POA: Diagnosis not present

## 2017-02-28 DIAGNOSIS — Z23 Encounter for immunization: Secondary | ICD-10-CM | POA: Diagnosis not present

## 2017-02-28 DIAGNOSIS — E2839 Other primary ovarian failure: Secondary | ICD-10-CM

## 2017-02-28 MED ORDER — GLUCOSE BLOOD VI STRP: 1.0000 | ORAL_STRIP | Freq: Two times a day (BID) | 5 refills | Status: DC

## 2017-02-28 NOTE — Progress Notes (Signed)
Chief Complaint  Patient presents with  . Follow-up    3 month  stopped own metformin a month ago Her A1c is 5.9.  We discussed that this is considered good diet control of her DM and the metformin is not needed. She has mild hyperlipidemia and I recommend a statin.  She does not want to start med and will try red yeast rice and possible fish oil.  Will re check in 6 mo She does not want to decrease her synthroid even though her TSH is again low.  We discussed that over correction of thyroid places her at risk of bone loss - and she insists she feels awful on a lower dose. Even reducing one half a pill a week made her symptomatic. She has not lost weight and is reminded this is advisable She does [facility] - [facility]  Patient Active Problem List   Diagnosis Date Noted  . Dyspepsia 09/29/2016  . Colon polyps 08/23/2016  . Family history of cancer 08/23/2016  . HLD (hyperlipidemia) 08/23/2016  . Obesity, Class II, BMI 35-39.9, with comorbidity 08/23/2016  . GERD (gastroesophageal reflux disease) 08/14/2016  . Hypothyroid 08/14/2016  . Type 2 diabetes mellitus without complication, without long-term current use of insulin ([place]) 08/14/2016  . Perimenopausal vasomotor symptoms 02/11/2016  . Postmenopausal atrophic vaginitis 11/09/2015    Outpatient Encounter Prescriptions as of 02/28/2017  Medication Sig  . EPINEPHrine 0.3 mg/0.3 mL IJ SOAJ injection Inject 0.3 mLs (0.3 mg total) into the skin as needed.  Marland Kitchen estradiol (VIVELLE-DOT) 0.0375 MG/24HR Place 1 patch onto the skin 2 (two) times a week.  . lansoprazole (PREVACID) 15 MG capsule Take 15 mg by mouth daily as needed. For relief   . levothyroxine (SYNTHROID, LEVOTHROID) 112 MCG tablet Take 1 tablet (112 mcg total) by mouth daily.   No facility-administered encounter medications on file as of 02/28/2017.     Allergies  Allergen Reactions  . Iodine Anaphylaxis  . Other Anaphylaxis    Allergic to seafood  and nuts  . Ibuprofen Other (See Comments)    Uncertain - headache  . Naproxen Other (See Comments)    headache  . Penicillins Swelling    Review of Systems  Constitutional: Positive for fatigue. Negative for activity change, appetite change and unexpected weight change.  HENT: Negative for congestion, dental problem, postnasal drip and rhinorrhea.   Eyes: Negative for redness and visual disturbance.  Respiratory: Negative for cough and shortness of breath.   Cardiovascular: Negative for chest pain, palpitations and leg swelling.  Gastrointestinal: Negative for abdominal pain, constipation and diarrhea.  Genitourinary: Negative for difficulty urinating, frequency and menstrual problem.  Musculoskeletal: Negative for arthralgias and back pain.  Skin:       Dry skin, thin hair  Neurological: Negative for dizziness and headaches.  Psychiatric/Behavioral: Negative for dysphoric mood and sleep disturbance. The patient is not nervous/anxious.        "stressed"      BP 122/74 (BP Location: Right Arm, Patient Position: Sitting, Cuff Size: Large)   Pulse 88   Temp (!) 96.7 F (35.9 C) (Temporal)   Resp 18   Ht 5\' 6"  (1.676 m)   Wt 219 lb 1.9 oz (99.4 kg)   SpO2 100%   BMI 35.37 kg/m   Physical Exam  Constitutional: She is oriented to person, place, and time. She appears well-developed and well-nourished.  Looks tired  HENT:  Head: Normocephalic and atraumatic.  Right  Ear: External ear normal.  Left Ear: External ear normal.  Mouth/Throat: Oropharynx is clear and moist.  Eyes: Pupils are equal, round, and reactive to light. Conjunctivae are normal.  Neck: Normal range of motion. Neck supple. No thyromegaly present.  Cardiovascular: Normal rate, regular rhythm and normal heart sounds.   Pulmonary/Chest: Effort normal and breath sounds normal. No respiratory distress.  Abdominal: Soft. Bowel sounds are normal.  Musculoskeletal: Normal range of motion. She exhibits no edema.    Lymphadenopathy:    She has no cervical adenopathy.  Neurological: She is alert and oriented to person, place, and time. She displays abnormal reflex.  Gait normal.  Reflexes dull  Skin: Skin is warm and dry.  Psychiatric: She has a normal mood and affect. Her behavior is normal. Thought content normal.    ASSESSMENT/PLAN:  1. Post-menopausal dexa due - DG Bone Density; Future  2. Needs flu shot done - Flu Vaccine QUAD 36+ mos IM  3. Type 2 diabetes mellitus without complication, without long-term current use of insulin (HCC) Controlled with diet  4. Hyperlipidemia, unspecified hyperlipidemia type Refuses statin.  framingham risk calculated at 14.4%.  Discussed with patient.   Patient Instructions  Continue to exercise daily if you are able Continue to watch the carbohydrates and sweets in your diet Try to reduce cholesterol - consider red yeast rice  See me in six months Will re check labs next time    Raylene Everts, MD

## 2017-02-28 NOTE — Telephone Encounter (Signed)
[place], left message regarding patient's mammo and bone density. She states she sent bone density back because post menopause is not a valid dx for this because of patient's insurance, they need a new code. [employer] and M[employer]. She states that she also sent a message to Dr Meda Coffee as well.   Callback# [phone]

## 2017-02-28 NOTE — Patient Instructions (Signed)
Continue to exercise daily if you are able Continue to watch the carbohydrates and sweets in your diet Try to reduce cholesterol - consider red yeast rice  See me in six months Will re check labs next time

## 2017-03-01 ENCOUNTER — Telehealth: Payer: Self-pay | Admitting: Family Medicine

## 2017-03-01 ENCOUNTER — Telehealth: Payer: Self-pay

## 2017-03-01 MED ORDER — GLUCOSE BLOOD VI STRP: 1.0000 | ORAL_STRIP | Freq: Two times a day (BID) | 5 refills | Status: DC

## 2017-03-01 NOTE — Telephone Encounter (Signed)
Patient is requesting a refill of one touch test strips, also wants Dr.Simpson to know that she got an pneumonia shot 06/2017.  Cb# [phone]

## 2017-03-01 NOTE — Telephone Encounter (Signed)
Test strips sent to pharmacy for pt.

## 2017-03-02 ENCOUNTER — Ambulatory Visit: Payer: Medicare Other

## 2017-03-27 ENCOUNTER — Ambulatory Visit
Admission: RE | Admit: 2017-03-27 | Discharge: 2017-03-27 | Disposition: A | Discharge status: Home / Self Care | Payer: Federal, State, Local not specified - PPO | Source: Ambulatory Visit | Attending: Family Medicine | Admitting: Family Medicine

## 2017-03-27 ENCOUNTER — Other Ambulatory Visit: Payer: Self-pay | Admitting: Family Medicine

## 2017-03-27 DIAGNOSIS — Z1239 Encounter for other screening for malignant neoplasm of breast: Secondary | ICD-10-CM

## 2017-03-27 DIAGNOSIS — Z780 Asymptomatic menopausal state: Secondary | ICD-10-CM | POA: Diagnosis not present

## 2017-03-27 DIAGNOSIS — Z1231 Encounter for screening mammogram for malignant neoplasm of breast: Secondary | ICD-10-CM | POA: Diagnosis not present

## 2017-03-27 DIAGNOSIS — E2839 Other primary ovarian failure: Secondary | ICD-10-CM

## 2017-03-27 DIAGNOSIS — Z13820 Encounter for screening for osteoporosis: Secondary | ICD-10-CM | POA: Diagnosis not present

## 2017-03-27 DIAGNOSIS — R928 Other abnormal and inconclusive findings on diagnostic imaging of breast: Secondary | ICD-10-CM

## 2017-03-29 ENCOUNTER — Encounter: Payer: Self-pay | Admitting: Family Medicine

## 2017-04-04 ENCOUNTER — Ambulatory Visit
Admission: RE | Admit: 2017-04-04 | Discharge: 2017-04-04 | Disposition: A | Discharge status: Home / Self Care | Payer: Federal, State, Local not specified - PPO | Source: Ambulatory Visit | Attending: Family Medicine | Admitting: Family Medicine

## 2017-04-04 DIAGNOSIS — R928 Other abnormal and inconclusive findings on diagnostic imaging of breast: Secondary | ICD-10-CM

## 2017-04-04 DIAGNOSIS — N6489 Other specified disorders of breast: Secondary | ICD-10-CM | POA: Diagnosis not present

## 2017-05-22 ENCOUNTER — Ambulatory Visit: Payer: Medicare Other | Admitting: Family Medicine

## 2017-06-01 DIAGNOSIS — H01009 Unspecified blepharitis unspecified eye, unspecified eyelid: Secondary | ICD-10-CM | POA: Diagnosis not present

## 2017-06-01 DIAGNOSIS — H02841 Edema of right upper eyelid: Secondary | ICD-10-CM | POA: Diagnosis not present

## 2017-06-01 LAB — HM DIABETES EYE EXAM

## 2017-07-13 ENCOUNTER — Ambulatory Visit: Payer: Medicare Other | Admitting: Family Medicine

## 2017-07-16 ENCOUNTER — Ambulatory Visit: Payer: Medicare Other | Admitting: Family Medicine

## 2017-07-26 ENCOUNTER — Ambulatory Visit (INDEPENDENT_AMBULATORY_CARE_PROVIDER_SITE_OTHER): Payer: Medicare Other | Admitting: Orthopedic Surgery

## 2017-07-26 ENCOUNTER — Ambulatory Visit (INDEPENDENT_AMBULATORY_CARE_PROVIDER_SITE_OTHER): Payer: Medicare Other

## 2017-07-26 VITALS — BP 111/78 | HR 92 | Ht 66.0 in | Wt 220.0 lb

## 2017-07-26 DIAGNOSIS — M542 Cervicalgia: Secondary | ICD-10-CM

## 2017-07-26 DIAGNOSIS — M47812 Spondylosis without myelopathy or radiculopathy, cervical region: Secondary | ICD-10-CM | POA: Diagnosis not present

## 2017-07-26 DIAGNOSIS — M25519 Pain in unspecified shoulder: Secondary | ICD-10-CM

## 2017-07-26 NOTE — Patient Instructions (Signed)
  Physical therapy has been ordered for you at [facility] [phone] is the phone number to call if you want to call to schedule. Please let us know if you do not hear anything within one week.  

## 2017-07-26 NOTE — Progress Notes (Signed)
NEW PATIENT OFFICE VISIT   Chief Complaint  Patient presents with  . Neck Pain    Neck pain, no injury.    72 year old female presents for evaluation of shoulder pain  She is 72 years old she says she has had cervical spine problem in the past wore a cervical collar at night it caused her neck to become weak  She presents now with a several week history of what she describes as shoulder pain which is actually in the midline of the cervical spine and up in the trapezius muscle radiating towards the shoulder and up into the left ear associated with some pain there as well.  She is tried Tylenol cannot take anti-inflammatories.  She has no associated numbness tingling or weakness.  She denies any trauma    Review of Systems  Constitutional: Negative for chills, fever, malaise/fatigue and weight loss.  Musculoskeletal: Negative for falls.  Skin: Negative.      Past Medical History:  Diagnosis Date  . Allergy    food allergies, drug allergies  . Anemia   . Anxiety   . Cataract   . Diabetes mellitus without complication (HCC)    no medication   . GERD (gastroesophageal reflux disease)   . Hyperlipidemia   . Thyroid disease     Past Surgical History:  Procedure Laterality Date  . ABDOMINAL HYSTERECTOMY     bleeding. fibroids  . CESAREAN SECTION    . CHOLECYSTECTOMY    . COLONOSCOPY  2011   [place]: two 3-4 mm sessile polyps, hyperplastic, sigmoid diverticula, TI appeared normal.   . ESOPHAGOGASTRODUODENOSCOPY  2011   [place]: non-bleeding erosive gastropathy, normal duodenum. path: unremarkable duodenum, negative H.pylori, minimal esophagitis   . TONSILLECTOMY  1975    Family History  Problem Relation Age of Onset  . Cancer Maternal Grandmother        breast  . Cancer Maternal Grandfather   . Cancer Paternal Grandmother   . Cancer Paternal Grandfather   . Arthritis Mother   . Cancer Mother        breast  . COPD Mother   . Cancer Father        throat  .  Stroke Father   . Colon cancer Neg Hx   . Breast cancer Neg Hx    Social History   Tobacco Use  . Smoking status: Former Research scientist (life sciences)  . Smokeless tobacco: Never Used  . Tobacco comment: quit 2002  Substance Use Topics  . Alcohol use: No  . Drug use: No     No outpatient medications have been marked as taking for the 07/26/17 encounter (Office Visit) with Carole Civil, MD.    BP 111/78   Pulse 92   Ht 5\' 6"  (1.676 m)   Wt 220 lb (99.8 kg)   BMI 35.51 kg/m   Physical Exam  Constitutional: She is oriented to person, place, and time. She appears well-developed and well-nourished.  Neurological: She is alert and oriented to person, place, and time.  Psychiatric: She has a normal mood and affect. Judgment normal.  Vitals reviewed.   Ortho Exam Cervical spine shows decreased rotation right to left painful flexion extension and tenderness at the base of the cervical spine and into the left trapezius muscle nontender on the right side.  Left shoulder nontender full range of motion normal strength stability alignment.  Skin of the cervical spine left and right shoulder normal  Right and left upper extremity exhibit no contracture subluxation atrophy or  tremor she had normal pinprick sensation throughout the left and right arm normal pulses bilaterally no evidence of lymphadenopathy of the cervical or axillary areas of either side  X-ray shows cervical spondylosis at C3 and 4 C4 and 5 C5 and 6 C6 and 7 loss of cervical lordosis and uncovertebral joint spur formation sclerosis consistent with cervical spondylosis MEDICAL DECISION SECTION  xrays ordered?  Yes  My independent reading of xrays: is noted   Encounter Diagnoses  Name Primary?  . Neck and shoulder pain   . Cervical spondylosis without myelopathy Yes     PLAN:     PT  TYLENOL' TRY CERVICAL PILLOW  4 WEEKS FU

## 2017-08-04 ENCOUNTER — Encounter (HOSPITAL_COMMUNITY): Payer: Self-pay | Admitting: Cardiology

## 2017-08-04 ENCOUNTER — Emergency Department (HOSPITAL_COMMUNITY)
Admission: EM | Admit: 2017-08-04 | Discharge: 2017-08-04 | Disposition: A | Discharge status: Home / Self Care | Payer: Medicare Other | Attending: Emergency Medicine | Admitting: Emergency Medicine

## 2017-08-04 ENCOUNTER — Emergency Department (HOSPITAL_COMMUNITY): Payer: Medicare Other

## 2017-08-04 DIAGNOSIS — E039 Hypothyroidism, unspecified: Secondary | ICD-10-CM | POA: Insufficient documentation

## 2017-08-04 DIAGNOSIS — B349 Viral infection, unspecified: Secondary | ICD-10-CM | POA: Insufficient documentation

## 2017-08-04 DIAGNOSIS — R05 Cough: Secondary | ICD-10-CM | POA: Insufficient documentation

## 2017-08-04 DIAGNOSIS — Z79899 Other long term (current) drug therapy: Secondary | ICD-10-CM | POA: Insufficient documentation

## 2017-08-04 DIAGNOSIS — J069 Acute upper respiratory infection, unspecified: Secondary | ICD-10-CM | POA: Diagnosis not present

## 2017-08-04 DIAGNOSIS — J9801 Acute bronchospasm: Secondary | ICD-10-CM | POA: Insufficient documentation

## 2017-08-04 DIAGNOSIS — E119 Type 2 diabetes mellitus without complications: Secondary | ICD-10-CM | POA: Diagnosis not present

## 2017-08-04 DIAGNOSIS — Z87891 Personal history of nicotine dependence: Secondary | ICD-10-CM | POA: Diagnosis not present

## 2017-08-04 DIAGNOSIS — R0602 Shortness of breath: Secondary | ICD-10-CM | POA: Diagnosis not present

## 2017-08-04 DIAGNOSIS — R0789 Other chest pain: Secondary | ICD-10-CM | POA: Diagnosis not present

## 2017-08-04 DIAGNOSIS — R059 Cough, unspecified: Secondary | ICD-10-CM

## 2017-08-04 LAB — BASIC METABOLIC PANEL
ANION GAP: 11 (ref 5–15)
BUN: 12 mg/dL (ref 6–20)
CALCIUM: 9.3 mg/dL (ref 8.9–10.3)
CO2: 22 mmol/L (ref 22–32)
Chloride: 105 mmol/L (ref 101–111)
Creatinine, Ser: 0.89 mg/dL (ref 0.44–1.00)
Glucose, Bld: 110 mg/dL — ABNORMAL HIGH (ref 65–99)
Potassium: 4.1 mmol/L (ref 3.5–5.1)
Sodium: 138 mmol/L (ref 135–145)

## 2017-08-04 LAB — CBC WITH DIFFERENTIAL/PLATELET
BASOS ABS: 0 10*3/uL (ref 0.0–0.1)
BASOS PCT: 1 %
EOS ABS: 0.2 10*3/uL (ref 0.0–0.7)
Eosinophils Relative: 3 %
HCT: 42.3 % (ref 36.0–46.0)
HEMOGLOBIN: 13.3 g/dL (ref 12.0–15.0)
Lymphocytes Relative: 36 %
Lymphs Abs: 2.9 10*3/uL (ref 0.7–4.0)
MCH: 25.7 pg — ABNORMAL LOW (ref 26.0–34.0)
MCHC: 31.4 g/dL (ref 30.0–36.0)
MCV: 81.8 fL (ref 78.0–100.0)
Monocytes Absolute: 0.7 10*3/uL (ref 0.1–1.0)
Monocytes Relative: 8 %
NEUTROS PCT: 52 %
Neutro Abs: 4.3 10*3/uL (ref 1.7–7.7)
Platelets: 229 10*3/uL (ref 150–400)
RBC: 5.17 MIL/uL — AB (ref 3.87–5.11)
RDW: 17.2 % — ABNORMAL HIGH (ref 11.5–15.5)
WBC: 8.1 10*3/uL (ref 4.0–10.5)

## 2017-08-04 LAB — D-DIMER, QUANTITATIVE: D-Dimer, Quant: 0.41 ug/mL-FEU (ref 0.00–0.50)

## 2017-08-04 LAB — URINALYSIS, ROUTINE W REFLEX MICROSCOPIC
BILIRUBIN URINE: NEGATIVE
Glucose, UA: NEGATIVE mg/dL
Hgb urine dipstick: NEGATIVE
KETONES UR: NEGATIVE mg/dL
LEUKOCYTES UA: NEGATIVE
NITRITE: NEGATIVE
PROTEIN: NEGATIVE mg/dL
Specific Gravity, Urine: 1.019 (ref 1.005–1.030)
pH: 5 (ref 5.0–8.0)

## 2017-08-04 LAB — TROPONIN I

## 2017-08-04 LAB — INFLUENZA PANEL BY PCR (TYPE A & B)
INFLBPCR: NEGATIVE
Influenza A By PCR: NEGATIVE

## 2017-08-04 LAB — RAPID STREP SCREEN (MED CTR MEBANE ONLY): STREPTOCOCCUS, GROUP A SCREEN (DIRECT): NEGATIVE

## 2017-08-04 MED ORDER — IPRATROPIUM-ALBUTEROL 0.5-2.5 (3) MG/3ML IN SOLN
3.0000 mL | Freq: Once | RESPIRATORY_TRACT | Status: AC
Start: 2017-08-04 — End: 2017-08-04
  Administered 2017-08-04: 3 mL via RESPIRATORY_TRACT
  Filled 2017-08-04: qty 3

## 2017-08-04 MED ORDER — ALBUTEROL SULFATE (2.5 MG/3ML) 0.083% IN NEBU
2.5000 mg | INHALATION_SOLUTION | Freq: Once | RESPIRATORY_TRACT | Status: AC
  Administered 2017-08-04: 2.5 mg via RESPIRATORY_TRACT
  Filled 2017-08-04: qty 3

## 2017-08-04 MED ORDER — BENZONATATE 100 MG PO CAPS: 100.0000 mg | ORAL_CAPSULE | Freq: Three times a day (TID) | ORAL | 0 refills | Status: DC | PRN

## 2017-08-04 MED ORDER — ALBUTEROL SULFATE HFA 108 (90 BASE) MCG/ACT IN AERS: 2.0000 | INHALATION_SPRAY | RESPIRATORY_TRACT | 0 refills | Status: DC | PRN

## 2017-08-04 NOTE — ED Notes (Signed)
Have paged respiratory  

## 2017-08-04 NOTE — ED Provider Notes (Signed)
[facility] Provider Note   CSN: [id number] Arrival date & time: 08/04/17  1435     History   Chief Complaint Chief Complaint  Patient presents with  . Shortness of Breath    HPI Kara [name] is a 72 y.o. female.   Shortness of Breath     Pt was seen at 1510.  Per pt, c/o gradual onset and persistence of constant sore throat, runny/stuffy nose, sinus congestion, and cough for the past 9 days. Has been associated with chills and home temps to "102." Pt states for the past 4 to 5 days she has had a constant chest "tightness" and "squeezing," associated with SOB and wheezing. Pt has been taking OTC cough meds without significant improvement of her cough.  Denies rash, no neck pain, no palpitations, no N/V/D, no abd pain.    Past Medical History:  Diagnosis Date  . Allergy    food allergies, drug allergies  . Anemia   . Anxiety   . Cataract   . Diabetes mellitus without complication (HCC)    no medication   . GERD (gastroesophageal reflux disease)   . Hyperlipidemia   . Thyroid disease     Patient Active Problem List   Diagnosis Date Noted  . Dyspepsia 09/29/2016  . Colon polyps 08/23/2016  . Family history of cancer 08/23/2016  . HLD (hyperlipidemia) 08/23/2016  . Obesity, Class II, BMI 35-39.9, with comorbidity 08/23/2016  . GERD (gastroesophageal reflux disease) 08/14/2016  . Hypothyroid 08/14/2016  . Type 2 diabetes mellitus without complication, without long-term current use of insulin ([place]) 08/14/2016  . Perimenopausal vasomotor symptoms 02/11/2016  . Postmenopausal atrophic vaginitis 11/09/2015    Past Surgical History:  Procedure Laterality Date  . ABDOMINAL HYSTERECTOMY     bleeding. fibroids  . CESAREAN SECTION    . CHOLECYSTECTOMY    . COLONOSCOPY  2011   [place]: two 3-4 mm sessile polyps, hyperplastic, sigmoid diverticula, TI appeared normal.   . ESOPHAGOGASTRODUODENOSCOPY  2011   [place]: non-bleeding erosive  gastropathy, normal duodenum. path: unremarkable duodenum, negative H.pylori, minimal esophagitis   . TONSILLECTOMY  1975     OB History   None      Home Medications    Prior to Admission medications   Medication Sig Start Date End Date Taking? Authorizing Provider  EPINEPHrine 0.3 mg/0.3 mL IJ SOAJ injection Inject 0.3 mLs (0.3 mg total) into the skin as needed. 11/28/16   Raylene Everts, MD  estradiol (VIVELLE-DOT) 0.0375 MG/24HR Place 1 patch onto the skin 2 (two) times a week. 12/28/16   Jonnie Kind, MD  glucose blood test strip 1 each by Other route 2 (two) times daily. Use as instructed 03/01/17   Raylene Everts, MD  lansoprazole (PREVACID) 15 MG capsule Take 15 mg by mouth daily as needed. For relief     [provider]  levothyroxine (SYNTHROID, LEVOTHROID) 112 MCG tablet Take 1 tablet (112 mcg total) by mouth daily. 10/12/16   Raylene Everts, MD    Family History Family History  Problem Relation Age of Onset  . Cancer Maternal Grandmother        breast  . Cancer Maternal Grandfather   . Cancer Paternal Grandmother   . Cancer Paternal Grandfather   . Arthritis Mother   . Cancer Mother        breast  . COPD Mother   . Cancer Father        throat  . Stroke Father   .  Colon cancer Neg Hx   . Breast cancer Neg Hx     Social History Social History   Tobacco Use  . Smoking status: Former Research scientist (life sciences)  . Smokeless tobacco: Never Used  . Tobacco comment: quit 2002  Substance Use Topics  . Alcohol use: No  . Drug use: No     Allergies   Iodine; Other; Ibuprofen; Naproxen; and Penicillins   Review of Systems Review of Systems  Respiratory: Positive for shortness of breath.   ROS: Statement: All systems negative except as marked or noted in the HPI; Constitutional: +fever and chills. ; ; Eyes: Negative for eye pain, redness and discharge. ; ; ENMT: Negative for ear pain, hoarseness, +nasal congestion, sinus pressure and sore throat. ; ;  Cardiovascular: Negative for chest pain, palpitations, diaphoresis, and peripheral edema. ; ; Respiratory: +cough, wheezing, SOB. Negative for stridor. ; ; Gastrointestinal: Negative for nausea, vomiting, diarrhea, abdominal pain, blood in stool, hematemesis, jaundice and rectal bleeding. . ; ; Genitourinary: Negative for dysuria, flank pain and hematuria. ; ; Musculoskeletal: Negative for back pain and neck pain. Negative for swelling and trauma.; ; Skin: Negative for pruritus, rash, abrasions, blisters, bruising and skin lesion.; ; Neuro: Negative for headache, lightheadedness and neck stiffness. Negative for weakness, altered level of consciousness, altered mental status, extremity weakness, paresthesias, involuntary movement, seizure and syncope.      Physical Exam Updated Vital Signs BP 122/75 (BP Location: Left Arm)   Pulse 86   Temp 98.9 F (37.2 C) (Oral)   Resp 18   Ht 5\' 6"  (1.676 m)   Wt 99.8 kg (220 lb)   SpO2 98%   BMI 35.51 kg/m   Physical Exam 1515: Physical examination:  Nursing notes reviewed; Vital signs and O2 SAT reviewed;  Constitutional: Well developed, Well nourished, Well hydrated, In no acute distress; Head:  Normocephalic, atraumatic; Eyes: EOMI, PERRL, No scleral icterus; ENMT: TM's clear bilat. +edemetous nasal turbinates bilat with clear rhinorrhea. Mouth and pharynx without lesions. No tonsillar exudates. No intra-oral edema. No submandibular or sublingual edema. No hoarse voice, no drooling, no stridor. No pain with manipulation of larynx. No trismus. Mouth and pharynx normal, Mucous membranes moist; Neck: Supple, Full range of motion, No lymphadenopathy; Cardiovascular: Regular rate and rhythm, No gallop; Respiratory: Breath sounds clear & equal bilaterally, scattered exp wheezes bilat. No audible wheezing.  Speaking full sentences with ease, Normal respiratory effort/excursion; Chest: Nontender, Movement normal; Abdomen: Soft, Nontender, Nondistended, Normal bowel  sounds; Genitourinary: No CVA tenderness; Extremities: Peripheral pulses normal, No tenderness, No edema, No calf edema or asymmetry.; Neuro: AA&Ox3, Major CN grossly intact.  Speech clear. No gross focal motor or sensory deficits in extremities.; Skin: Color normal, Warm, Dry.   ED Treatments / Results  Labs (all labs ordered are listed, but only abnormal results are displayed)   EKG  EKG Interpretation  Date/Time:  Saturday August 04 2017 15:50:37 EDT Ventricular Rate:  71 PR Interval:    QRS Duration: 82 QT Interval:  391 QTC Calculation: 425 R Axis:   31 Text Interpretation:  Sinus rhythm Borderline short PR interval RSR' in V1 or V2, right VCD or RVH When compared with ECG of 01/20/2011 Rate slower Otherwise no significant change Confirmed by Francine Graven [id number]) on 08/04/2017 3:56:51 PM        Radiology   Procedures Procedures (including critical care time)  Medications Ordered in ED Medications  albuterol (PROVENTIL) (2.5 MG/3ML) 0.083% nebulizer solution 2.5 mg (has no administration in time range)  ipratropium-albuterol (DUONEB) 0.5-2.5 (3) MG/3ML nebulizer solution 3 mL (has no administration in time range)     Initial Impression / Assessment and Plan / ED Course  I have reviewed the triage vital signs and the nursing notes.  Pertinent labs & imaging results that were available during my care of the patient were reviewed by me and considered in my medical decision making (see chart for details).  MDM Reviewed: previous chart, nursing note and vitals Reviewed previous: labs and ECG Interpretation: labs, ECG and x-ray   Results for orders placed or performed during the hospital encounter of 08/04/17  Rapid strep screen  Result Value Ref Range   Streptococcus, Group A Screen (Direct) NEGATIVE NEGATIVE  Basic metabolic panel  Result Value Ref Range   Sodium 138 135 - 145 mmol/L   Potassium 4.1 3.5 - 5.1 mmol/L   Chloride 105 101 - 111 mmol/L   CO2 22 22  - 32 mmol/L   Glucose, Bld 110 (H) 65 - 99 mg/dL   BUN 12 6 - 20 mg/dL   Creatinine, Ser 0.89 0.44 - 1.00 mg/dL   Calcium 9.3 8.9 - 10.3 mg/dL   GFR calc non Af Amer >60 >60 mL/min   GFR calc Af Amer >60 >60 mL/min   Anion gap 11 5 - 15  Troponin I  Result Value Ref Range   Troponin I <0.03 <0.03 ng/mL  CBC with Differential  Result Value Ref Range   WBC 8.1 4.0 - 10.5 K/uL   RBC 5.17 (H) 3.87 - 5.11 MIL/uL   Hemoglobin 13.3 12.0 - 15.0 g/dL   HCT 42.3 36.0 - 46.0 %   MCV 81.8 78.0 - 100.0 fL   MCH 25.7 (L) 26.0 - 34.0 pg   MCHC 31.4 30.0 - 36.0 g/dL   RDW 17.2 (H) 11.5 - 15.5 %   Platelets 229 150 - 400 K/uL   Neutrophils Relative % 52 %   Neutro Abs 4.3 1.7 - 7.7 K/uL   Lymphocytes Relative 36 %   Lymphs Abs 2.9 0.7 - 4.0 K/uL   Monocytes Relative 8 %   Monocytes Absolute 0.7 0.1 - 1.0 K/uL   Eosinophils Relative 3 %   Eosinophils Absolute 0.2 0.0 - 0.7 K/uL   Basophils Relative 1 %   Basophils Absolute 0.0 0.0 - 0.1 K/uL  D-dimer, quantitative  Result Value Ref Range   D-Dimer, Quant 0.41 0.00 - 0.50 ug/mL-FEU  Urinalysis, Routine w reflex microscopic  Result Value Ref Range   Color, Urine YELLOW YELLOW   APPearance HAZY (A) CLEAR   Specific Gravity, Urine 1.019 1.005 - 1.030   pH 5.0 5.0 - 8.0   Glucose, UA NEGATIVE NEGATIVE mg/dL   Hgb urine dipstick NEGATIVE NEGATIVE   Bilirubin Urine NEGATIVE NEGATIVE   Ketones, ur NEGATIVE NEGATIVE mg/dL   Protein, ur NEGATIVE NEGATIVE mg/dL   Nitrite NEGATIVE NEGATIVE   Leukocytes, UA NEGATIVE NEGATIVE  Influenza panel by PCR (type A & B)  Result Value Ref Range   Influenza A By PCR NEGATIVE NEGATIVE   Influenza B By PCR NEGATIVE NEGATIVE   Dg Chest 2 View Result Date: 08/04/2017 CLINICAL DATA:  Cough and wheezing 1 week which shortness-of-breath and fever. EXAM: CHEST - 2 VIEW COMPARISON:  07/05/2016 FINDINGS: Lungs are adequately inflated without focal airspace consolidation or effusion. Cardiomediastinal silhouette  and remainder of the exam is unchanged. IMPRESSION: No active cardiopulmonary disease. Electronically Signed   By: Marin Olp M.D.   On: 08/04/2017 15:49  1715:  Workup reassuring. Pt states she "feels better" after neb.  NAD, lungs CTA bilat, no wheezing, resps easy, speaking full sentences, Sats 100% R/A.  Pt ambulated around the ED with Sats remaining 97-100 % R/A, resps easy, NAD.  Pt wants to go home now. Will tx symptomatically at this time. Dx and testing d/w pt.  Questions answered.  Verb understanding, agreeable to d/c home with outpt f/u.    Final Clinical Impressions(s) / ED Diagnoses   Final diagnoses:  None    ED Discharge Orders    None       Francine Graven, DO 08/07/17 1545

## 2017-08-04 NOTE — Discharge Instructions (Addendum)
Take the prescriptions as directed. Take over the counter decongestant (such as sudafed), as directed on packaging, for the next week.  Use over the counter normal saline nasal spray, with frequent nose blowing, several times per day for the next 2 weeks. Use your albuterol inhaler (2 to 4 puffs) every 4 hours for the next 7 days, then as needed for cough, wheezing, or shortness of breath. Take over the counter tylenol, as directed on packaging, as needed for discomfort.  Gargle with warm water several times per day to help with discomfort.  May also use over the counter sore throat pain medicines such as chloraseptic or sucrets, as directed on packaging, as needed for discomfort. Call your regular medical doctor Monday morning to schedule a follow up appointment within the next 3 days.  Return to the Emergency Department immediately sooner if worsening.

## 2017-08-04 NOTE — ED Triage Notes (Signed)
Cold symptoms  times one week.  Sob times 2 days.

## 2017-08-04 NOTE — ED Notes (Signed)
Pt did great ambulating. Pt is very ready to go home. O2 sats 97-100%

## 2017-08-06 LAB — URINE CULTURE

## 2017-08-07 LAB — CULTURE, GROUP A STREP (THRC)

## 2017-08-16 ENCOUNTER — Other Ambulatory Visit: Payer: Self-pay | Admitting: Family Medicine

## 2017-08-16 DIAGNOSIS — E119 Type 2 diabetes mellitus without complications: Secondary | ICD-10-CM | POA: Diagnosis not present

## 2017-08-16 DIAGNOSIS — E039 Hypothyroidism, unspecified: Secondary | ICD-10-CM

## 2017-08-17 LAB — COMPLETE METABOLIC PANEL WITH GFR
AG Ratio: 1.2 (calc) (ref 1.0–2.5)
ALT: 20 U/L (ref 6–29)
AST: 20 U/L (ref 10–35)
Albumin: 3.7 g/dL (ref 3.6–5.1)
Alkaline phosphatase (APISO): 105 U/L (ref 33–130)
BUN: 14 mg/dL (ref 7–25)
CO2: 29 mmol/L (ref 20–32)
CREATININE: 0.78 mg/dL (ref 0.60–0.93)
Calcium: 9.4 mg/dL (ref 8.6–10.4)
Chloride: 106 mmol/L (ref 98–110)
GFR, EST AFRICAN AMERICAN: 89 mL/min/{1.73_m2} (ref >=60)
GFR, Est Non African American: 77 mL/min/{1.73_m2} (ref >=60)
Globulin: 3.1 g/dL (calc) (ref 1.9–3.7)
Glucose, Bld: 109 mg/dL — ABNORMAL HIGH (ref 65–99)
Potassium: 4.8 mmol/L (ref 3.5–5.3)
Sodium: 139 mmol/L (ref 135–146)
TOTAL PROTEIN: 6.8 g/dL (ref 6.1–8.1)
Total Bilirubin: 0.5 mg/dL (ref 0.2–1.2)

## 2017-08-17 LAB — CBC
HEMATOCRIT: 40.5 % (ref 35.0–45.0)
Hemoglobin: 13.4 g/dL (ref 11.7–15.5)
MCH: 26.1 pg — ABNORMAL LOW (ref 27.0–33.0)
MCHC: 33.1 g/dL (ref 32.0–36.0)
MCV: 78.8 fL — AB (ref 80.0–100.0)
MPV: 11.9 fL (ref 7.5–12.5)
PLATELETS: 220 10*3/uL (ref 140–400)
RBC: 5.14 10*6/uL — AB (ref 3.80–5.10)
RDW: 15.3 % — ABNORMAL HIGH (ref 11.0–15.0)
WBC: 6.5 10*3/uL (ref 3.8–10.8)

## 2017-08-17 LAB — LIPID PANEL
Cholesterol: 203 mg/dL — ABNORMAL HIGH (ref <200)
HDL: 56 mg/dL (ref >50)
LDL Cholesterol (Calc): 131 mg/dL (calc) — ABNORMAL HIGH
NON-HDL CHOLESTEROL (CALC): 147 mg/dL — AB (ref <130)
Total CHOL/HDL Ratio: 3.6 (calc) (ref <5.0)
Triglycerides: 64 mg/dL (ref <150)

## 2017-08-17 LAB — HEMOGLOBIN A1C
EAG (MMOL/L): 6.8 (calc)
Hgb A1c MFr Bld: 5.9 % of total Hgb — ABNORMAL HIGH (ref <5.7)
MEAN PLASMA GLUCOSE: 123 (calc)

## 2017-08-17 LAB — MICROALBUMIN / CREATININE URINE RATIO
Creatinine, Urine: 132 mg/dL (ref 20–275)
Microalb Creat Ratio: 4 mcg/mg creat (ref <30)
Microalb, Ur: 0.5 mg/dL

## 2017-08-17 LAB — TSH: TSH: 0.58 mIU/L (ref 0.40–4.50)

## 2017-08-22 ENCOUNTER — Other Ambulatory Visit: Payer: Self-pay

## 2017-08-22 ENCOUNTER — Ambulatory Visit (HOSPITAL_COMMUNITY): Payer: Medicare Other | Attending: Orthopedic Surgery

## 2017-08-22 ENCOUNTER — Encounter (HOSPITAL_COMMUNITY): Payer: Self-pay

## 2017-08-22 DIAGNOSIS — R293 Abnormal posture: Secondary | ICD-10-CM | POA: Insufficient documentation

## 2017-08-22 DIAGNOSIS — M542 Cervicalgia: Secondary | ICD-10-CM | POA: Diagnosis not present

## 2017-08-22 DIAGNOSIS — M25512 Pain in left shoulder: Secondary | ICD-10-CM | POA: Diagnosis not present

## 2017-08-22 NOTE — Therapy (Addendum)
Kara [name], [place], [address] Phone: [phone]   Fax:  [phone]  Physical Therapy Evaluation  Patient Details  Name: Kara [name] MRN: [id number] Date of Birth: [birth year] Referring Provider: Carole Civil, MD   Encounter Date: 08/22/2017     08/22/17 1702  PT Visits / Re-Eval  Visit Number 1  Number of Visits 9  Date for PT Re-Evaluation 09/19/17  Authorization  Authorization Type Medicare Part A and B; [employer] secondary (75 visit limit)  Authorization Time Period 08/22/17-09/19/17  Authorization - Visit Number 1  Authorization - Number of Visits 10  PT Time Calculation  PT Start Time 1751  PT Stop Time 1730  PT Time Calculation (min) 40 min  PT - End of Session  Activity Tolerance Patient tolerated treatment well  Behavior During Therapy [facility] for tasks assessed/performed (patient appeared overwhelmed)    Past Medical History:  Diagnosis Date  . Allergy    food allergies, drug allergies  . Anemia   . Anxiety   . Cataract   . Diabetes mellitus without complication (HCC)    no medication   . GERD (gastroesophageal reflux disease)   . Hyperlipidemia   . Thyroid disease     Past Surgical History:  Procedure Laterality Date  . ABDOMINAL HYSTERECTOMY     bleeding. fibroids  . CESAREAN SECTION    . CHOLECYSTECTOMY    . COLONOSCOPY  2011   [place]: two 3-4 mm sessile polyps, hyperplastic, sigmoid diverticula, TI appeared normal.   . ESOPHAGOGASTRODUODENOSCOPY  2011   [place]: non-bleeding erosive gastropathy, normal duodenum. path: unremarkable duodenum, negative H.pylori, minimal esophagitis   . TONSILLECTOMY  1975    There were no vitals filed for this visit.    08/22/17 1744  Symptoms/Limitations  Subjective Kara [name] reports worsening neck and left shoulder pain over the last 2 months. She report she is able to manage her pain throughout her daily activities with  Tylenol and the worst her pain gets is about a 5/10 with the best it gets being about a 2/10. 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 She states she was previously participating in exercise 5x/week and really enjoys going to the gym. However in the last month she has not been able to go and over the last several months her attendance has gradually decreased. She states her neck and left shoulder pain seem to be connected and that her daily activities around the house and caring for her mom seem to increase her pain. She states that with Tylenol she is able to manage it and her pain will decrease within 30 minutes.   Pertinent History Coconut allergy, wears compression stockings for Bil LE edema  Limitations Other (comment) (sleeping)  How long can you sit comfortably? unlimited pushes through pain always  How long can you stand comfortably? unlimited pushes through pain always  How long can you walk comfortably? unlimited pushes through pain always  Patient Stated Goals be able to sleep more easily and get back to the gym  Pain Assessment  Currently in Pain? Yes  Pain Score 5  Pain Location Neck  Pain Orientation Left;Mid;Lower  Pain Descriptors / Indicators Aching  Pain Type Chronic pain  Pain Onset More than a month ago  Pain Frequency Intermittent  Aggravating Factors  it is the worst at night when laying down  to go to sleep, laying on left or right side is painful  Pain Relieving Factors tylenol  Effect of Pain on Daily Activities just limits sleeping really, is able to manage for the msot part       08/22/17 0001  Assessment  Medical Diagnosis Neck and Shoulder Pain  Referring Provider Kara Civil, MD  Onset Date/Surgical Date  (worsened in last 2 months)  Prior Therapy ~15 years ago  Precautions  Precautions None  Restrictions  Weight Bearing Restrictions No  Balance Screen   Has the patient fallen in the past 6 months No  Has the patient had a decrease in activity level because of a fear of falling?  Yes  Is the patient reluctant to leave their home because of a fear of falling?  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 One level  [place] None  Prior Function  Level of [place] Retired  Leisure enjoyed going to gym; 5x/week buth asnt beena bel to get there at all, walking  is enjoyable  Cognition  Overall Cognitive Status Within Functional Limits for tasks assessed  Observation/Other Assessments  Focus on Therapeutic Outcomes (FOTO)  22% limited  Sensation  Light Touch Appears Intact  Additional Comments patient denies numbness and tingling related to movement of her cervical spine or UE's  Posture/Postural Control  Posture/Postural Control Postural limitations  Postural Limitations Rounded Shoulders;Forward head;Increased thoracic kyphosis  ROM / Strength  AROM / PROM / Strength AROM;Strength  AROM  AROM Assessment Site Cervical  Cervical Flexion 45  Cervical Extension 60  Cervical - Right Side Bend 35  Cervical - Left Side Bend 25  Cervical - Right Rotation 70  Cervical - Left Rotation 65  Palpation  Spinal mobility hypomobile along cervical spine, all segments  Palpation comment Tenderness along cervical paraspinal and left upper trapezius, and levator trap     Objective measurements completed on examination: See above findings.       08/22/17 1701  PT Education  Education provided Yes  Education Details Educated on exam findings and treatment interventions that will be used. Provided resource for in home aids to assist with her caregiver duties at home as she is a primary  caregiver to her mother and currently has not other help.  Person(s) Educated Patient  Methods Explanation;Handout  Comprehension Verbalized understanding     PT Short Term Goals - 08/24/17 1320      PT SHORT TERM GOAL #1   Title  Patient will be independent with HEP to improve cervical ROM/mobility and reduce fascial restrictions to decresae pain.    Time  2    Period  Weeks    Status  New    Target Date  09/05/17      PT SHORT TERM GOAL #2   Title  Patient will report maximum pain of 3/10 at night and greater ease of falling to sleep to improve overall QOL by reducing pain/fatigue levels.    Time  2    Period  Weeks    Status  New      PT SHORT TERM GOAL #3   Title  Patient will initiate process of acquirng assistance with caregiver duties within means to incresae personal time for return to gym  attendence or enjoyable leisure activities.     Time  2    Period  Weeks    Status  New        PT Long Term Goals - 08/22/17 1821      PT LONG TERM GOAL #1   Title  Cervical ROM wil improve by 8 degrees up to WNL's for all imited motions to demonstrate improved joint mobility and decreased myofascial restrictions to facilitate reduced pain.    Time  4    Period  Weeks    Status  New    Target Date  09/19/17      PT LONG TERM GOAL #2   Title  Patient will report particiaption in gym routine 2x per week or more to incresae functional activity level for overall health/wellness and progress towards PLOF.    Time  4    Period  Weeks    Status  New      PT LONG TERM GOAL #3   Title  Patient will report being able to sleep on Right and Left side at night with no increase in pain/discomfort to improve ease of falling to sleep and overall QOL.    Time  4    Period  Weeks    Status  New            08/22/17 1708  Plan  Clinical Impression Statement Ms. Hobson presents for initial outpatient PT evaluation for neck and shoulder pain primarily on her left side. She denies nausea,  vomiting, headache, dizziness, double vision, and numbness or tingling into UE. 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 Her primary complaints are pain in her neck and left shoulder at night when trying to sleep and a decrease in her ability to exercise and participate in a regular gym routine. Based on objective testing her current impairments include decreased ROM in cervical spine, UE weakness, pain, hypomobility in cervical spine, increased myofascial restrictions, and abnormal posture. Ms. Klang will benefit from skilled PT services to address these impairments and reduce pain to return to PLOF.  Clinical Presentation Stable  Clinical Decision Making Low  Pt will benefit from skilled therapeutic intervention in order to improve on the following deficits Decreased mobility;Decreased range of motion;Postural dysfunction;Pain;Improper body mechanics;Increased fascial restricitons;Hypomobility  Rehab Potential Good  PT Frequency 2x / week  PT Duration 4 weeks  PT Treatment/Interventions ADLs/Self Care Home Management;Therapeutic activities;Therapeutic exercise;Traction;Moist Heat;Neuromuscular re-education;Patient/family education;Manual techniques;Passive range of motion;Dry needling  PT Next Visit Plan Review Eval and goals. Initiate light soft tissue mobilization to cervical paraspinals and upper trap on Left side (patient is allergic to coconut). Initiate PA to C-spine. Begin decomrpession exercises and follow up on caregiver/home aid resources.  PT Home Exercise Plan (next session begin decompression exercises and provide handout)  Consulted and Agree with Plan of Care Patient        Patient will benefit from skilled therapeutic intervention in order to improve the following deficits and impairments:  Decreased mobility, Decreased range of motion, Postural  dysfunction, Pain, Improper body mechanics, Increased fascial restricitons, Hypomobility  Visit Diagnosis: Cervicalgia  Left shoulder pain, unspecified chronicity  Abnormal posture     Problem List Patient Active Problem List   Diagnosis Date Noted  . Dyspepsia 09/29/2016  . Colon polyps 08/23/2016  . Family history of cancer 08/23/2016  . HLD (hyperlipidemia) 08/23/2016  . Obesity,  Class II, BMI 35-39.9, with comorbidity 08/23/2016  . GERD (gastroesophageal reflux disease) 08/14/2016  . Hypothyroid 08/14/2016  . Type 2 diabetes mellitus without complication, without long-term current use of insulin ([place]) 08/14/2016  . Perimenopausal vasomotor symptoms 02/11/2016  . Postmenopausal atrophic vaginitis 11/09/2015    Kipp Brood, PT, DPT Physical Therapist with [facility]  08/24/2017 1:24 PM    [place], [place], [address] Phone: [phone]   Fax:  [phone]  Name: Kara [name] MRN: [id number] Date of Birth: 1946-05-01

## 2017-08-24 ENCOUNTER — Ambulatory Visit: Payer: Self-pay | Admitting: Orthopedic Surgery

## 2017-08-28 ENCOUNTER — Ambulatory Visit (HOSPITAL_COMMUNITY): Payer: Medicare Other | Admitting: Physical Therapy

## 2017-08-28 ENCOUNTER — Encounter (HOSPITAL_COMMUNITY): Payer: Self-pay | Admitting: Physical Therapy

## 2017-08-28 DIAGNOSIS — R293 Abnormal posture: Secondary | ICD-10-CM | POA: Diagnosis not present

## 2017-08-28 DIAGNOSIS — M25512 Pain in left shoulder: Secondary | ICD-10-CM | POA: Diagnosis not present

## 2017-08-28 DIAGNOSIS — M542 Cervicalgia: Secondary | ICD-10-CM | POA: Diagnosis not present

## 2017-08-28 NOTE — Therapy (Signed)
[place], [place], [address] Phone: [phone]   Fax:  [phone]  Physical Therapy Treatment  Patient Details  Name: Kara [name] MRN: [id number] Date of Birth: 12-07-1945 Referring Provider: Carole Civil, MD   Encounter Date: 08/28/2017  PT End of Session - 08/28/17 1743    Visit Number  2    Number of Visits  9    Date for PT Re-Evaluation  09/19/17    Authorization Type  Medicare Part A and B; [employer] secodnary    Authorization Time Period  08/22/17-09/19/17    Authorization - Visit Number  2    Authorization - Number of Visits  10    PT Start Time  8315    PT Stop Time  1761    PT Time Calculation (min)  39 min    Activity Tolerance  Patient tolerated treatment well    Behavior During Therapy  [facility] for tasks assessed/performed patient appeared overwhelmed       Past Medical History:  Diagnosis Date  . Allergy    food allergies, drug allergies  . Anemia   . Anxiety   . Cataract   . Diabetes mellitus without complication (HCC)    no medication   . GERD (gastroesophageal reflux disease)   . Hyperlipidemia   . Thyroid disease     Past Surgical History:  Procedure Laterality Date  . ABDOMINAL HYSTERECTOMY     bleeding. fibroids  . CESAREAN SECTION    . CHOLECYSTECTOMY    . COLONOSCOPY  2011   [place]: two 3-4 mm sessile polyps, hyperplastic, sigmoid diverticula, TI appeared normal.   . ESOPHAGOGASTRODUODENOSCOPY  2011   [place]: non-bleeding erosive gastropathy, normal duodenum. path: unremarkable duodenum, negative H.pylori, minimal esophagitis   . TONSILLECTOMY  1975    There were no vitals filed for this visit.  Subjective Assessment - 08/28/17 1607    Subjective  Patient reported that she isn't having pain currently. She stated that her main issue is that when she does have pain it starts in her neck and goes down her left shoulder.     Pertinent History  Coconut  allergy, wears compression stockings for Bil LE edema    Limitations  Other (comment) sleeping    How long can you sit comfortably?  unlimited pushes through pain always    How long can you stand comfortably?  unlimited pushes through pain always    How long can you walk comfortably?  unlimited pushes through pain always    Patient Stated Goals  be able to sleep more easily and get back to the gym    Currently in Pain?  No/denies    Pain Onset  --                       [facility] Adult PT Treatment/Exercise - 08/28/17 0001      Neck Exercises: Seated   Cervical Isometrics  Right rotation;Left rotation    Cervical Isometrics Limitations  5 x 5 second holds left and right    Neck Retraction  10 reps    Neck Retraction Limitations  5 second holds    Cervical Rotation  Both;5 reps Cervical flexion to extension x 5 each direction    Shoulder Rolls  10 reps;Backwards Upward, back down      Neck Exercises: Supine   Other Supine Exercise  Decompression exercises: Shoulder press into mat 5x 3 second  holds; Head press 5 x 3 second holds; Leg lengthener 5x 3 second holds each lower extremity; Leg press 5 x 3 second holds each lower extremity      Manual Therapy   Manual Therapy  Joint mobilization;Soft tissue mobilization    Manual therapy comments  20 minutes total All manual therapy completed separately from other skilled interventions    Joint Mobilization  Grade II posterior to anterior glides to C3-C7 spinous process 5 x 10 seconds at each spinous process. Patient prone with head of mat lowered. Performed to promote relaxation.     Soft tissue mobilization  Soft tissue mobilization to patient's left paraspinals and upper trapezius. Patient in sitting.              PT Education - 08/28/17 1742    Education provided  Yes    Education Details  Patient was educated on purpose and technique of exercises throughout session. Provided patient with decompression home exercises and  educated patient on these with a handout.     Person(s) Educated  Patient    Methods  Explanation;Handout;Demonstration;Tactile cues;Verbal cues    Comprehension  Verbalized understanding;Returned demonstration;Verbal cues required;Tactile cues required       PT Short Term Goals - 08/24/17 1320      PT SHORT TERM GOAL #1   Title  Patient will be independent with HEP to improve cervical ROM/mobility and reduce fascial restrictions to decresae pain.    Time  2    Period  Weeks    Status  New    Target Date  09/05/17      PT SHORT TERM GOAL #2   Title  Patient will report maximum pain of 3/10 at night and greater ease of falling to sleep to improve overall QOL by reducing pain/fatigue levels.    Time  2    Period  Weeks    Status  New      PT SHORT TERM GOAL #3   Title  Patient will initiate process of acquirng assistance with caregiver duties within means to incresae personal time for return to gym attendence or enjoyable leisure activities.     Time  2    Period  Weeks    Status  New        PT Long Term Goals - 08/22/17 1821      PT LONG TERM GOAL #1   Title  Cervical ROM wil improve by 8 degrees up to WNL's for all imited motions to demonstrate improved joint mobility and decreased myofascial restrictions to facilitate reduced pain.    Time  4    Period  Weeks    Status  New    Target Date  09/19/17      PT LONG TERM GOAL #2   Title  Patient will report particiaption in gym routine 2x per week or more to incresae functional activity level for overall health/wellness and progress towards PLOF.    Time  4    Period  Weeks    Status  New      PT LONG TERM GOAL #3   Title  Patient will report being able to sleep on Right and Left side at night with no increase in pain/discomfort to improve ease of falling to sleep and overall QOL.    Time  4    Period  Weeks    Status  New            Plan - 08/28/17 1752  Clinical Impression Statement  This session began with  a review of patient's evaluation and goals. Then session progressed to providing patient with exercises for her home exercise program and teaching her those decompression exercises. Then session progressed to patient performing sitting exercises including cervical isometrics, gentle range of motion exercises, and cervical retraction. Finally, session ended with therapist providing manual therapy to patient, with soft tissue mobilization with patient in sitting with pressure to patient's tolerance to decrease muscular restrictions. Then, patient in prone with therapist providing gentle grade II joint glides to patient's cervical spine to patient's tolerance to promote relaxation, with therapist adjusting to patient's tolerance.     Rehab Potential  Good    PT Frequency  2x / week    PT Duration  4 weeks    PT Treatment/Interventions  ADLs/Self Care Home Management;Therapeutic activities;Therapeutic exercise;Traction;Moist Heat;Neuromuscular re-education;Patient/family education;Manual techniques;Passive range of motion;Dry needling    PT Next Visit Plan  Follow-up on tolerane to light soft tissue mobilization to cervical paraspinals and upper trap on Left side and continue as appropriate (patient is allergic to coconut). Follow up on patent's tolerance to PA to C-spine and continue if with a physical therapist. Follow-up on home exercises, decomrpession exercises and follow up on caregiver/home aid resources.    PT Home Exercise Plan  Decompression exercises    Consulted and Agree with Plan of Care  Patient       Patient will benefit from skilled therapeutic intervention in order to improve the following deficits and impairments:  Decreased mobility, Decreased range of motion, Postural dysfunction, Pain, Improper body mechanics, Increased fascial restricitons, Hypomobility  Visit Diagnosis: Cervicalgia  Left shoulder pain, unspecified chronicity  Abnormal posture     Problem List Patient  Active Problem List   Diagnosis Date Noted  . Dyspepsia 09/29/2016  . Colon polyps 08/23/2016  . Family history of cancer 08/23/2016  . HLD (hyperlipidemia) 08/23/2016  . Obesity, Class II, BMI 35-39.9, with comorbidity 08/23/2016  . GERD (gastroesophageal reflux disease) 08/14/2016  . Hypothyroid 08/14/2016  . Type 2 diabetes mellitus without complication, without long-term current use of insulin ([place]) 08/14/2016  . Perimenopausal vasomotor symptoms 02/11/2016  . Postmenopausal atrophic vaginitis 11/09/2015   Clarene Critchley PT, DPT 6:01 PM, 08/28/17 [place], [place], [address] Phone: [phone]   Fax:  [phone]  Name: Kara [name] MRN: [id number] Date of Birth: 1946-05-04

## 2017-08-29 ENCOUNTER — Ambulatory Visit: Payer: Medicare Other | Admitting: Family Medicine

## 2017-08-30 ENCOUNTER — Ambulatory Visit (INDEPENDENT_AMBULATORY_CARE_PROVIDER_SITE_OTHER): Payer: Medicare Other | Admitting: Family Medicine

## 2017-08-30 ENCOUNTER — Encounter: Payer: Self-pay | Admitting: Family Medicine

## 2017-08-30 ENCOUNTER — Other Ambulatory Visit: Payer: Self-pay

## 2017-08-30 ENCOUNTER — Ambulatory Visit (HOSPITAL_COMMUNITY): Payer: Medicare Other

## 2017-08-30 ENCOUNTER — Encounter (HOSPITAL_COMMUNITY): Payer: Self-pay

## 2017-08-30 VITALS — BP 118/60 | HR 78 | Temp 98.0°F | Resp 12 | Ht 66.0 in | Wt 223.0 lb

## 2017-08-30 DIAGNOSIS — R7303 Prediabetes: Secondary | ICD-10-CM

## 2017-08-30 DIAGNOSIS — M25512 Pain in left shoulder: Secondary | ICD-10-CM

## 2017-08-30 DIAGNOSIS — M542 Cervicalgia: Secondary | ICD-10-CM | POA: Diagnosis not present

## 2017-08-30 DIAGNOSIS — E785 Hyperlipidemia, unspecified: Secondary | ICD-10-CM

## 2017-08-30 DIAGNOSIS — E039 Hypothyroidism, unspecified: Secondary | ICD-10-CM

## 2017-08-30 DIAGNOSIS — R293 Abnormal posture: Secondary | ICD-10-CM

## 2017-08-30 HISTORY — DX: Prediabetes: R73.03

## 2017-08-30 NOTE — Patient Instructions (Addendum)
Try the diet plan outlined my diabetes educator Walk/exercise every day that you are able  Need follow up and labs in 6 months You will see Dr Mannie Stabile for follow up

## 2017-08-30 NOTE — Therapy (Signed)
[place], [place], [address] Phone: [phone]   Fax:  [phone]  Physical Therapy Treatment  Patient Details  Name: Kara [name] MRN: [id number] Date of Birth: 11-20-1945 Referring Provider: Carole Civil, MD   Encounter Date: 08/30/2017  PT End of Session - 08/30/17 0913    Visit Number  3    Number of Visits  9    Date for PT Re-Evaluation  09/19/17    Authorization Type  Medicare Part A and B; Blue Cross [facility] secodnary    Authorization Time Period  08/22/17-09/19/17    Authorization - Visit Number  3    Authorization - Number of Visits  10    PT Start Time  0904    PT Stop Time  0943    PT Time Calculation (min)  39 min    Activity Tolerance  Patient tolerated treatment well    Behavior During Therapy  [facility] for tasks assessed/performed patient appeared overwhelmed       Past Medical History:  Diagnosis Date  . Allergy    food allergies, drug allergies  . Anemia   . Anxiety   . Cataract   . Diabetes mellitus without complication (HCC)    no medication   . GERD (gastroesophageal reflux disease)   . Hyperlipidemia   . Thyroid disease     Past Surgical History:  Procedure Laterality Date  . ABDOMINAL HYSTERECTOMY     bleeding. fibroids  . CESAREAN SECTION    . CHOLECYSTECTOMY    . COLONOSCOPY  2011   [place]: two 3-4 mm sessile polyps, hyperplastic, sigmoid diverticula, TI appeared normal.   . ESOPHAGOGASTRODUODENOSCOPY  2011   [place]: non-bleeding erosive gastropathy, normal duodenum. path: unremarkable duodenum, negative H.pylori, minimal esophagitis   . TONSILLECTOMY  1975    There were no vitals filed for this visit.  Subjective Assessment - 08/30/17 0911    Subjective  Patient is feeling sore today and reports she feels like her neck was worse after last session. She is going to [place] during the week of Mother's Day to see her best friend and her son is going to take  care of her mother while she is gone which she is really looking forward to. She reports he mother fell last weekend and rolled her ankle so she has been caring for her more since then. She also reports she was able to go to the gym 2x this week and went to the senior center for a tai chi class this week also.     Pertinent History  Coconut allergy, wears compression stockings for Bil LE edema    Limitations  Other (comment) sleeping    How long can you sit comfortably?  unlimited pushes through pain always    How long can you stand comfortably?  unlimited pushes through pain always    How long can you walk comfortably?  unlimited pushes through pain always    Patient Stated Goals  be able to sleep more easily and get back to the gym    Currently in Pain?  Yes    Pain Score  5     Pain Location  Neck    Pain Orientation  Left;Mid;Lower    Pain Descriptors / Indicators  Aching    Pain Type  Chronic pain    Pain Onset  More than a month ago    Pain Frequency  Intermittent  [place] Adult PT Treatment/Exercise - 08/30/17 0001      Neck Exercises: Machines for Strengthening   UBE (Upper Arm Bike)  4 minutes on level 1, retrograde for postural strengthening      Neck Exercises: Standing   Other Standing Exercises  Corner stretch 2x 10 reps with 10 second holds      Neck Exercises: Seated   Neck Retraction  15 reps;5 secs    Cervical Rotation  Right;Left;10 reps;Limitations    Cervical Rotation Limitations  3 second holds    Money  15 reps;3 secs;Limitations    Money Limitations  2 sets    Shoulder Rolls  Backwards;Forwards;15 reps    Upper Extremity D2  Extension;10 reps;Theraband;Limitations    Theraband Level (UE D2)  Level 2 (Red)    UE D2 Limitations  Bil UE, cues for posture throughout      Neck Exercises: Supine   Other Supine Exercise  Decompression exercises: Shoulder press into mat 10x 3 second holds; Head press 10x 3 second holds; Leg lengthener 10x 3 second holds each  lower extremity; Leg press 10x 3 second holds each lower extremity        PT Education - 08/30/17 0939    Education provided  Yes    Education Details  Educated on exercise adn updated HEP. Discussed that it takes time to [facility] sptrength and posture to reduce enck pain and that she may experience soem DOMS durign initial couple of weeks before it begins improving more.    Person(s) Educated  Patient    Methods  Explanation;Handout;Verbal cues;Tactile cues;Demonstration    Comprehension  Verbalized understanding;Returned demonstration       PT Short Term Goals - 08/24/17 1320      PT SHORT TERM GOAL #1   Title  Patient will be independent with HEP to improve cervical ROM/mobility and reduce fascial restrictions to decresae pain.    Time  2    Period  Weeks    Status  New    Target Date  09/05/17      PT SHORT TERM GOAL #2   Title  Patient will report maximum pain of 3/10 at night and greater ease of falling to sleep to improve overall QOL by reducing pain/fatigue levels.    Time  2    Period  Weeks    Status  New      PT SHORT TERM GOAL #3   Title  Patient will initiate process of acquirng assistance with caregiver duties within means to incresae personal time for return to gym attendence or enjoyable leisure activities.     Time  2    Period  Weeks    Status  New        PT Long Term Goals - 08/22/17 1821      PT LONG TERM GOAL #1   Title  Cervical ROM wil improve by 8 degrees up to WNL's for all imited motions to demonstrate improved joint mobility and decreased myofascial restrictions to facilitate reduced pain.    Time  4    Period  Weeks    Status  New    Target Date  09/19/17      PT LONG TERM GOAL #2   Title  Patient will report particiaption in gym routine 2x per week or more to incresae functional activity level for overall health/wellness and progress towards PLOF.    Time  4    Period  Weeks    Status  New  PT LONG TERM GOAL #3   Title  Patient will  report being able to sleep on Right and Left side at night with no increase in pain/discomfort to improve ease of falling to sleep and overall QOL.    Time  4    Period  Weeks    Status  New        Plan - 08/30/17 0914    Clinical Impression Statement  Continued with focus on postural strengthening this session and improving cervical mobility with gentle ROM exercises. Initiated UBE this session for endurance training and patient denied pain but reported feeling her muscles work quite a bit. Seated postural exercises were progressed with D2 theraband exercise chin tucks, and shoulder external rotation/scapular retraction. She reported improvement in pain/discomfort with corner stretch and "money" exercise therefore they were added the her HEP. Manual therapy was held today as she reported worsening pain following manual last session. She will continue to benefit from skilled PT services to address current impairments and reduce pain to return to PLOF.    Rehab Potential  Good    PT Frequency  2x / week    PT Duration  4 weeks    PT Treatment/Interventions  ADLs/Self Care Home Management;Therapeutic activities;Therapeutic exercise;Traction;Moist Heat;Neuromuscular re-education;Patient/family education;Manual techniques;Passive range of motion;Dry needling    PT Next Visit Plan  Continue to progress postural exercises and initiate self thoracic mobilization/stretches rather than manual therapy. Follow up on caregiver/home aid resources.    PT Home Exercise Plan  Decompression exercises; 08/30/17 - money, corner stretch    Consulted and Agree with Plan of Care  Patient       Patient will benefit from skilled therapeutic intervention in order to improve the following deficits and impairments:  Decreased mobility, Decreased range of motion, Postural dysfunction, Pain, Improper body mechanics, Increased fascial restricitons, Hypomobility  Visit Diagnosis: Cervicalgia  Left shoulder pain,  unspecified chronicity  Abnormal posture     Problem List Patient Active Problem List   Diagnosis Date Noted  . Dyspepsia 09/29/2016  . Colon polyps 08/23/2016  . Family history of cancer 08/23/2016  . HLD (hyperlipidemia) 08/23/2016  . Obesity, Class II, BMI 35-39.9, with comorbidity 08/23/2016  . GERD (gastroesophageal reflux disease) 08/14/2016  . Hypothyroid 08/14/2016  . Type 2 diabetes mellitus without complication, without long-term current use of insulin ([place]) 08/14/2016  . Perimenopausal vasomotor symptoms 02/11/2016  . Postmenopausal atrophic vaginitis 11/09/2015    Kipp Brood, PT, DPT Physical Therapist with [facility]  08/30/2017 10:03 AM    [place], [place], [address] Phone: [phone]   Fax:  [phone]  Name: Kara [name] MRN: [id number] Date of Birth: December 20, 1945

## 2017-08-30 NOTE — Progress Notes (Signed)
Chief Complaint  Patient presents with  . Hypothyroidism    follow up, recent lab work  . Diabetes  . Hyperlipidemia  Patient is retired and moved to the area to take care of her aging 70 year old mother with Alzheimer's dementia.  This is stressful for her.  In spite of her best intentions she has not been exercising, has not been eating well, and has not lost weight.  Her recent lab work is reflection of this with a hemoglobin A1c stable at 5.9 and abnormal lipids.  We discussed at her last medical visit that her Framingham risk of developing heart disease over the next 10 years is 14+ percent.  This is a medical indication for use of a statin.  The patient has refused to use a statin, and feels that if she did diet and exercise she could bring her cholesterol down.  I told her this is certainly her choice, but the statins have been proven by research to reduce cholesterol, reduce heart attack and stroke, and improve life expectancy.  She is going to give it 6 more months, check her lipids, and will agree to a statin if she is unable to achieve a Framingham risk of under 10% She goes [facility] the [facility] center for activities.  [facility].  She has not been attending as frequently as she intended. No complaints today.  She is compliant with her medications. She has hypothyroidism replaced by Synthroid.  Her TSH is therapeutic.  Patient Active Problem List   Diagnosis Date Noted  . Prediabetes 08/30/2017  . Dyspepsia 09/29/2016  . Colon polyps 08/23/2016  . Family history of cancer 08/23/2016  . HLD (hyperlipidemia) 08/23/2016  . Obesity, Class II, BMI 35-39.9, with comorbidity 08/23/2016  . GERD (gastroesophageal reflux disease) 08/14/2016  . Hypothyroid 08/14/2016  . Perimenopausal vasomotor symptoms 02/11/2016  . Postmenopausal atrophic vaginitis 11/09/2015    Outpatient Encounter Medications as of 08/30/2017  Medication Sig  . albuterol (PROVENTIL HFA;VENTOLIN HFA)  108 (90 Base) MCG/ACT inhaler Inhale 2 puffs into the lungs every 4 (four) hours as needed for wheezing or shortness of breath.  . benzonatate (TESSALON) 100 MG capsule Take 1 capsule (100 mg total) by mouth 3 (three) times daily as needed for cough.  . EPINEPHrine 0.3 mg/0.3 mL IJ SOAJ injection Inject 0.3 mLs (0.3 mg total) into the skin as needed.  Marland Kitchen estradiol (VIVELLE-DOT) 0.0375 MG/24HR Place 1 patch onto the skin 2 (two) times a week.  Marland Kitchen glucose blood test strip 1 each by Other route 2 (two) times daily. Use as instructed  . lansoprazole (PREVACID) 15 MG capsule Take 15 mg by mouth daily as needed. For relief   . levothyroxine (SYNTHROID, LEVOTHROID) 112 MCG tablet Take 1 tablet (112 mcg total) by mouth daily.   No facility-administered encounter medications on file as of 08/30/2017.     Allergies  Allergen Reactions  . Iodine Anaphylaxis  . Other Anaphylaxis    Allergic to seafood and nuts  . Ibuprofen Other (See Comments)    Uncertain - headache  . Naproxen Other (See Comments)    headache  . Penicillins Swelling    Review of Systems  Constitutional: Positive for fatigue. Negative for activity change, appetite change and unexpected weight change.  HENT: Negative for congestion, dental problem, postnasal drip and rhinorrhea.   Eyes: Negative for redness and visual disturbance.  Respiratory: Negative for cough and shortness of breath.   Cardiovascular: Negative for chest pain,  palpitations and leg swelling.  Gastrointestinal: Negative for abdominal pain, constipation and diarrhea.  Genitourinary: Negative for difficulty urinating, frequency and menstrual problem.  Musculoskeletal: Negative for arthralgias and back pain.  Skin: Negative.   Neurological: Negative for dizziness and headaches.  Psychiatric/Behavioral: Positive for dysphoric mood and sleep disturbance. The patient is nervous/anxious.        Caregiver stress    Physical Exam  Constitutional: She is oriented to  person, place, and time. She appears well-developed and well-nourished.  Looks tired  HENT:  Head: Normocephalic and atraumatic.  Right Ear: External ear normal.  Left Ear: External ear normal.  Mouth/Throat: Oropharynx is clear and moist.  Eyes: Pupils are equal, round, and reactive to light. Conjunctivae are normal.  Neck: Normal range of motion. Neck supple. No thyromegaly present.  Cardiovascular: Normal rate, regular rhythm and normal heart sounds.  Pulmonary/Chest: Effort normal and breath sounds normal. No respiratory distress.  Abdominal: Soft. Bowel sounds are normal.  Musculoskeletal: Normal range of motion. She exhibits no edema.  Lymphadenopathy:    She has no cervical adenopathy.  Neurological: She is alert and oriented to person, place, and time.  Gait normal.  Reflexes dull  Skin: Skin is warm and dry.  Psychiatric: Her behavior is normal. Thought content normal.  Tired.  Frustrated with her inability to have time for herself or meet people with demands in the home    BP 118/60   Pulse 78   Temp 98 F (36.7 C) (Oral)   Resp 12   Ht 5\' 6"  (1.676 m)   Wt 223 lb 0.6 oz (101.2 kg)   SpO2 98%   BMI 36.00 kg/m   ASSESSMENT/PLAN:  1. Prediabetes Stable.  Importance of diet and exercise emphasized  2. Hypothyroidism, unspecified type controlled  3. Hyperlipidemia, unspecified hyperlipidemia type Unchanged.  Diet and exercise again recommended  Patient went to a diabetes educator last fall.  She states that she remembers the instructions given to her and still has the written information.  She feels like she can implement the diet that was shared with her.  Patient Instructions  Try the diet plan outlined my diabetes educator Walk/exercise every day that you are able  Need follow up and labs in 6 months You will see Dr Mannie Stabile for follow up    Raylene Everts, MD

## 2017-08-30 NOTE — Patient Instructions (Addendum)
   Money (Shoulder external rotation with scapular retraction): 1-2 sets of 15-20 repetitions with 5 second holds  Start with your elbows at your side and arms bent at 90 degree angle, palms up. Rotate your hands outward, and squeeze your shoulder blades down and together keeping your elbows at your sides.     CORNER STRETCH: 10 times for 10 seconds  While standing at a corner of a wall, place your arms on the walls with elobws bent so that your upper arms are horizontal and your forearms are directed upwards as shown. Take one step forward towards the corner. Bend your front knee until a stretch is felt along the front of your chest and/or shoulders. Your arms should be pointed downward towards the ground.  NOTE: Your legs should control the stretch by bending or straightening your front knee.

## 2017-09-04 ENCOUNTER — Encounter (HOSPITAL_COMMUNITY): Payer: Self-pay

## 2017-09-04 ENCOUNTER — Ambulatory Visit (HOSPITAL_COMMUNITY): Payer: Medicare Other

## 2017-09-04 DIAGNOSIS — R293 Abnormal posture: Secondary | ICD-10-CM

## 2017-09-04 DIAGNOSIS — M25512 Pain in left shoulder: Secondary | ICD-10-CM

## 2017-09-04 DIAGNOSIS — M542 Cervicalgia: Secondary | ICD-10-CM

## 2017-09-04 NOTE — Therapy (Signed)
[place], [place], [address] Phone: [phone]   Fax:  [phone]  Physical Therapy Treatment  Patient Details  Name: Kara [name] MRN: [id number] Date of Birth: 17-Nov-1945 Referring Provider: Arther Abbott, MD   Encounter Date: 09/04/2017  PT End of Session - 09/04/17 1658    Visit Number  4    Number of Visits  9    Date for PT Re-Evaluation  09/19/17    Authorization Type  Medicare Part A and B; [employer] secondary (75 visit limit)    Authorization Time Period  08/22/17-09/19/17    Authorization - Visit Number  4    Authorization - Number of Visits  10    PT Start Time  6283    PT Stop Time  1730 last 4 min on UBE    PT Time Calculation (min)  42 min    Activity Tolerance  Patient tolerated treatment well    Behavior During Therapy  [facility] for tasks assessed/performed       Past Medical History:  Diagnosis Date  . Allergy    food allergies, drug allergies  . Anemia   . Anxiety   . Cataract   . Diabetes mellitus without complication (HCC)    no medication   . GERD (gastroesophageal reflux disease)   . Hyperlipidemia   . Thyroid disease     Past Surgical History:  Procedure Laterality Date  . ABDOMINAL HYSTERECTOMY     bleeding. fibroids  . CESAREAN SECTION    . CHOLECYSTECTOMY    . COLONOSCOPY  2011   [place]: two 3-4 mm sessile polyps, hyperplastic, sigmoid diverticula, TI appeared normal.   . ESOPHAGOGASTRODUODENOSCOPY  2011   [place]: non-bleeding erosive gastropathy, normal duodenum. path: unremarkable duodenum, negative H.pylori, minimal esophagitis   . TONSILLECTOMY  1975    There were no vitals filed for this visit.  Subjective Assessment - 09/04/17 1651    Subjective  Pt stated she is feeling good today, no reports of current pain.  Does continue to have majority of pain at night, wakes up when rolls to Rt side.      Pertinent History  Coconut allergy, wears  compression stockings for Bil LE edema    Patient Stated Goals  be able to sleep more easily and get back to the gym    Currently in Pain?  No/denies         [facility] PT Assessment - 09/04/17 0001      Assessment   Medical Diagnosis  Neck and Shoulder Pain    Referring Provider  Arther Abbott, MD    Onset Date/Surgical Date  -- worsened in last 12 months    Next MD Visit  -- unscheduled    Prior Therapy  ~15 years ago      Precautions   Precautions  None                   [facility] Adult PT Treatment/Exercise - 09/04/17 0001      Neck Exercises: Machines for Strengthening   UBE (Upper Arm Bike)  4 minutes on level 1, retrograde for postural strengthening      Neck Exercises: Theraband   Shoulder Extension  10 reps;Red    Rows  10 reps;Red      Neck Exercises: Seated   Neck Retraction  15 reps;5 secs    Neck Retraction Limitations  5" holds, cueing to reduce    Money  15 reps;3 secs;Limitations    Shoulder Rolls  Backwards;15 reps;Limitations    Shoulder Rolls Limitations  up, back/retract and relax    Other Seated Exercise  self thoracic mobs 5sets      Neck Exercises: Supine   Other Supine Exercise  Decompression with RTB 1-4 5reps 3" holds      Neck Exercises: Stretches   Corner Stretch  3 reps;30 seconds               PT Short Term Goals - 08/24/17 1320      PT SHORT TERM GOAL #1   Title  Patient will be independent with HEP to improve cervical ROM/mobility and reduce fascial restrictions to decresae pain.    Time  2    Period  Weeks    Status  New    Target Date  09/05/17      PT SHORT TERM GOAL #2   Title  Patient will report maximum pain of 3/10 at night and greater ease of falling to sleep to improve overall QOL by reducing pain/fatigue levels.    Time  2    Period  Weeks    Status  New      PT SHORT TERM GOAL #3   Title  Patient will initiate process of acquirng assistance with caregiver duties within means to incresae personal time  for return to gym attendence or enjoyable leisure activities.     Time  2    Period  Weeks    Status  New        PT Long Term Goals - 08/22/17 1821      PT LONG TERM GOAL #1   Title  Cervical ROM wil improve by 8 degrees up to WNL's for all imited motions to demonstrate improved joint mobility and decreased myofascial restrictions to facilitate reduced pain.    Time  4    Period  Weeks    Status  New    Target Date  09/19/17      PT LONG TERM GOAL #2   Title  Patient will report particiaption in gym routine 2x per week or more to incresae functional activity level for overall health/wellness and progress towards PLOF.    Time  4    Period  Weeks    Status  New      PT LONG TERM GOAL #3   Title  Patient will report being able to sleep on Right and Left side at night with no increase in pain/discomfort to improve ease of falling to sleep and overall QOL.    Time  4    Period  Weeks    Status  New            Plan - 09/04/17 1722    Clinical Impression Statement  Session focus with postural strengthening and cervical mobility.  Added decompression exercises with theraband for postural strengthening as well as standing postural strengtheing exercises.  Pt able to demonstrate good form with majority of exercises following initial instruction and verbal cueing for form/technique.  Added self mobs to improve thoracic extension.  No reports of increased pain through session, tolerated well.    PT Frequency  2x / week    PT Duration  4 weeks    PT Treatment/Interventions  ADLs/Self Care Home Management;Therapeutic activities;Therapeutic exercise;Traction;Moist Heat;Neuromuscular re-education;Patient/family education;Manual techniques;Passive range of motion;Dry needling    PT Next Visit Plan  Continue to progress postural exercises and self thoracic mobilization.  Hold on manual  technqiues.  Add 3D thoracic excursion next session.  Follow up on caregiver/ home aid resources.    PT  Home Exercise Plan  Decompression exercises; 08/30/17 - money, corner stretch; 09/04/17:  decompression with RTB       Patient will benefit from skilled therapeutic intervention in order to improve the following deficits and impairments:  Decreased mobility, Decreased range of motion, Postural dysfunction, Pain, Improper body mechanics, Increased fascial restricitons, Hypomobility  Visit Diagnosis: Cervicalgia  Left shoulder pain, unspecified chronicity  Abnormal posture     Problem List Patient Active Problem List   Diagnosis Date Noted  . Prediabetes 08/30/2017  . Dyspepsia 09/29/2016  . Colon polyps 08/23/2016  . Family history of cancer 08/23/2016  . HLD (hyperlipidemia) 08/23/2016  . Obesity, Class II, BMI 35-39.9, with comorbidity 08/23/2016  . GERD (gastroesophageal reflux disease) 08/14/2016  . Hypothyroid 08/14/2016  . Perimenopausal vasomotor symptoms 02/11/2016  . Postmenopausal atrophic vaginitis 11/09/2015   Ihor Austin, LPTA; CBIS [phone]  Aldona Lento 09/04/2017, 5:32 PM  [place], [place], [address] Phone: [phone]   Fax:  [phone]  Name: Kara [name] MRN: [id number] Date of Birth: 21-Feb-1946

## 2017-09-05 ENCOUNTER — Other Ambulatory Visit: Payer: Self-pay

## 2017-09-05 ENCOUNTER — Ambulatory Visit (HOSPITAL_COMMUNITY): Payer: Medicare Other

## 2017-09-05 ENCOUNTER — Encounter (HOSPITAL_COMMUNITY): Payer: Self-pay

## 2017-09-05 DIAGNOSIS — R293 Abnormal posture: Secondary | ICD-10-CM | POA: Diagnosis not present

## 2017-09-05 DIAGNOSIS — M25512 Pain in left shoulder: Secondary | ICD-10-CM | POA: Diagnosis not present

## 2017-09-05 DIAGNOSIS — M542 Cervicalgia: Secondary | ICD-10-CM | POA: Diagnosis not present

## 2017-09-05 NOTE — Therapy (Signed)
[place], [place], [address] Phone: [phone]   Fax:  [phone]  Physical Therapy Treatment  Patient Details  Name: Kara [name] MRN: [id number] Date of Birth: Feb 09, 1946 Referring Provider: Arther Abbott, MD   Encounter Date: 09/05/2017  PT End of Session - 09/05/17 1659    Visit Number  5    Number of Visits  9    Date for PT Re-Evaluation  09/19/17    Authorization Type  Medicare Part A and B; [employer] secondary (75 visit limit)    Authorization Time Period  08/22/17-09/19/17    Authorization - Visit Number  5    Authorization - Number of Visits  10    PT Start Time  1027    PT Stop Time  1727    PT Time Calculation (min)  38 min    Activity Tolerance  Patient tolerated treatment well    Behavior During Therapy  [facility] for tasks assessed/performed       Past Medical History:  Diagnosis Date  . Allergy    food allergies, drug allergies  . Anemia   . Anxiety   . Cataract   . Diabetes mellitus without complication (HCC)    no medication   . GERD (gastroesophageal reflux disease)   . Hyperlipidemia   . Thyroid disease     Past Surgical History:  Procedure Laterality Date  . ABDOMINAL HYSTERECTOMY     bleeding. fibroids  . CESAREAN SECTION    . CHOLECYSTECTOMY    . COLONOSCOPY  2011   [place]: two 3-4 mm sessile polyps, hyperplastic, sigmoid diverticula, TI appeared normal.   . ESOPHAGOGASTRODUODENOSCOPY  2011   [place]: non-bleeding erosive gastropathy, normal duodenum. path: unremarkable duodenum, negative H.pylori, minimal esophagitis   . TONSILLECTOMY  1975    There were no vitals filed for this visit.  Subjective Assessment - 09/05/17 1652    Subjective  Patient reports she is feeling much better today and reports she went to the gym twice last week but hasn't been able to get back this week. She states she has been doing her exercises daily and they make her neck feel  better.    Pertinent History  Coconut allergy, wears compression stockings for Bil LE edema    Patient Stated Goals  be able to sleep more easily and get back to the gym    Currently in Pain?  No/denies        [facility] Adult PT Treatment/Exercise - 09/05/17 0001      Neck Exercises: Machines for Strengthening   UBE (Upper Arm Bike)  4 minutes on level 2, retrograde for postural strengthening      Neck Exercises: Theraband   Scapula Retraction  15 reps;Red    Shoulder Extension  15 reps;Red    Rows  15 reps;Red      Neck Exercises: Standing   Other Standing Exercises  Corner stretch 1x 15 reps with 10 second holds      Neck Exercises: Seated   Neck Retraction  15 reps;5 secs    Neck Retraction Limitations  5" holds, 2 sets    Cervical Rotation  Right;Left;10 reps;Limitations    Cervical Rotation Limitations  3 second holds    Money  15 reps;3 secs;Limitations    Money Limitations  Red TB    Shoulder Rolls  Backwards;15 reps;Limitations;Forwards      Neck Exercises: Supine   Other Supine Exercise  Decompression with RTB:  the overhead, the side pull, the sash, arm rotation, 10 reps each, 3" holds    Other Supine Exercise  Thoracic Self mobilization: 3 segments with half foam roll uner back, 5 reps overhead flexion with Bil UE at each segment         PT Education - 09/05/17 1726    Education provided  Yes    Education Details  Educated on exercise throughout session and on purpose of interventions. Discussed benefits of participating in group exercise programs or exercies with a trainer at a local facility.    Person(s) Educated  Patient    Methods  Explanation    Comprehension  Verbalized understanding       PT Short Term Goals - 08/24/17 1320      PT SHORT TERM GOAL #1   Title  Patient will be independent with HEP to improve cervical ROM/mobility and reduce fascial restrictions to decresae pain.    Time  2    Period  Weeks    Status  New    Target Date  09/05/17       PT SHORT TERM GOAL #2   Title  Patient will report maximum pain of 3/10 at night and greater ease of falling to sleep to improve overall QOL by reducing pain/fatigue levels.    Time  2    Period  Weeks    Status  New      PT SHORT TERM GOAL #3   Title  Patient will initiate process of acquirng assistance with caregiver duties within means to incresae personal time for return to gym attendence or enjoyable leisure activities.     Time  2    Period  Weeks    Status  New        PT Long Term Goals - 08/22/17 1821      PT LONG TERM GOAL #1   Title  Cervical ROM wil improve by 8 degrees up to WNL's for all imited motions to demonstrate improved joint mobility and decreased myofascial restrictions to facilitate reduced pain.    Time  4    Period  Weeks    Status  New    Target Date  09/19/17      PT LONG TERM GOAL #2   Title  Patient will report particiaption in gym routine 2x per week or more to incresae functional activity level for overall health/wellness and progress towards PLOF.    Time  4    Period  Weeks    Status  New      PT LONG TERM GOAL #3   Title  Patient will report being able to sleep on Right and Left side at night with no increase in pain/discomfort to improve ease of falling to sleep and overall QOL.    Time  4    Period  Weeks    Status  New        Plan - 09/05/17 1659    Clinical Impression Statement  Patient is progressing well and arrived with decreased pain this session and reports her pain has been lower over the last few days. Session continued to work on postural strengthening and improving mobility of cervical/thoracic spine. Patient initiated advanced scapular retraction exercise with theraband, and self-mobilization in supine for thoracic mobility. She denied pain throughout session and stated she could feel her muscles working but it was not pain. She continues to have excellent form with seated cervical retraction. Patient was educated on benefits of  continuing with gym routine and regular exercise program for continued pain reduction and overall health/wellness. She will continue to benefit from skilled PT services to address current impairments and reduce pain to return to PLOF.    PT Frequency  2x / week    PT Duration  4 weeks    PT Treatment/Interventions  ADLs/Self Care Home Management;Therapeutic activities;Therapeutic exercise;Traction;Moist Heat;Neuromuscular re-education;Patient/family education;Manual techniques;Passive range of motion;Dry needling    PT Next Visit Plan  Re-assess next session. Update HEP with TB exercises. Continue to progress postural exercises and self thoracic mobilization.  Hold on manual technqiues.  Add 3D thoracic excursion next session.  Follow up on caregiver/ home aid resources.    PT Home Exercise Plan  Decompression exercises; 08/30/17 - money, corner stretch; 09/04/17:  decompression with RTB    Consulted and Agree with Plan of Care  Patient       Patient will benefit from skilled therapeutic intervention in order to improve the following deficits and impairments:  Decreased mobility, Decreased range of motion, Postural dysfunction, Pain, Improper body mechanics, Increased fascial restricitons, Hypomobility  Visit Diagnosis: Cervicalgia  Left shoulder pain, unspecified chronicity  Abnormal posture     Problem List Patient Active Problem List   Diagnosis Date Noted  . Prediabetes 08/30/2017  . Dyspepsia 09/29/2016  . Colon polyps 08/23/2016  . Family history of cancer 08/23/2016  . HLD (hyperlipidemia) 08/23/2016  . Obesity, Class II, BMI 35-39.9, with comorbidity 08/23/2016  . GERD (gastroesophageal reflux disease) 08/14/2016  . Hypothyroid 08/14/2016  . Perimenopausal vasomotor symptoms 02/11/2016  . Postmenopausal atrophic vaginitis 11/09/2015    Kipp Brood, PT, DPT Physical Therapist with [facility]  09/05/2017 5:38 PM     [place], [place], [address] Phone: [phone]   Fax:  [phone]  Name: Jin Shockley MRN: [id number] Date of Birth: 05/27/1945

## 2017-09-10 ENCOUNTER — Ambulatory Visit (HOSPITAL_COMMUNITY): Payer: Medicare Other

## 2017-09-10 ENCOUNTER — Other Ambulatory Visit: Payer: Self-pay

## 2017-09-10 ENCOUNTER — Encounter (HOSPITAL_COMMUNITY): Payer: Self-pay

## 2017-09-10 DIAGNOSIS — M542 Cervicalgia: Secondary | ICD-10-CM

## 2017-09-10 DIAGNOSIS — R293 Abnormal posture: Secondary | ICD-10-CM

## 2017-09-10 DIAGNOSIS — M25512 Pain in left shoulder: Secondary | ICD-10-CM | POA: Diagnosis not present

## 2017-09-10 NOTE — Patient Instructions (Signed)
   ELASTIC BAND ROWS: 1-2 sets of 15-20 repetitions  Holding elastic band with both hands, draw back the band as you bend your elbows. Keep your elbows near the side of your body.      ELASTIC BAND EXTENSION BILATERAL SHOULDER: 1-2 sets of 15-20 repetitions  While holding an elastic band with both arms in front of you with your elbows straight, pull the band downwards and back towards your side.

## 2017-09-10 NOTE — Therapy (Signed)
[place], [place], [address] Phone: [phone]   Fax:  [phone]  Physical Therapy Treatment  Patient Details  Name: Kara [name] MRN: [id number] Date of Birth: 09-11-1945 Referring Provider: Arther Abbott, MD   Encounter Date: 09/10/2017  PT End of Session - 09/10/17 1705    Visit Number  6    Number of Visits  9    Date for PT Re-Evaluation  09/19/17    Authorization Type  Medicare Part A and B; [employer] secondary (75 visit limit)    Authorization Time Period  08/22/17-09/19/17    Authorization - Visit Number  5    Authorization - Number of Visits  10    PT Start Time  5188    PT Stop Time  1732    PT Time Calculation (min)  46 min    Activity Tolerance  Patient tolerated treatment well    Behavior During Therapy  [facility] for tasks assessed/performed       Past Medical History:  Diagnosis Date  . Allergy    food allergies, drug allergies  . Anemia   . Anxiety   . Cataract   . Diabetes mellitus without complication (HCC)    no medication   . GERD (gastroesophageal reflux disease)   . Hyperlipidemia   . Thyroid disease     Past Surgical History:  Procedure Laterality Date  . ABDOMINAL HYSTERECTOMY     bleeding. fibroids  . CESAREAN SECTION    . CHOLECYSTECTOMY    . COLONOSCOPY  2011   [place]: two 3-4 mm sessile polyps, hyperplastic, sigmoid diverticula, TI appeared normal.   . ESOPHAGOGASTRODUODENOSCOPY  2011   [place]: non-bleeding erosive gastropathy, normal duodenum. path: unremarkable duodenum, negative H.pylori, minimal esophagitis   . TONSILLECTOMY  1975    There were no vitals filed for this visit.  Subjective Assessment - 09/10/17 1650    Subjective  Patient reports her pain is still worse when laying on her Rt side at night and that she also has pain when laying on her left shoulder or leaning into her Lt elbow. She reports she was not consistent about performing  her exercises this weekend and that she can tell she didn't do her exercises because her neck hurts more.    Pertinent History  Coconut allergy, wears compression stockings for Bil LE edema    Limitations  Other (comment) sleeping    Patient Stated Goals  be able to sleep more easily and get back to the gym    Currently in Pain?  Yes    Pain Score  2     Pain Location  Neck    Pain Orientation  Left;Lower    Pain Descriptors / Indicators  Aching;Dull    Pain Type  Chronic pain    Pain Onset  More than a month ago    Pain Frequency  Intermittent    Aggravating Factors   laying or leanign into left elbow/shoulder         [facility] Adult PT Treatment/Exercise - 09/10/17 0001      Neck Exercises: Machines for Strengthening   UBE (Upper Arm Bike)  4 minutes on level 2, retrograde for postural strengthening      Neck Exercises: Theraband   Scapula Retraction  15 reps;Limitations;Red mild pain in left shoulder    Scapula Retraction Limitations  2 sets    Shoulder Extension  15 reps;Green;Limitations    Shoulder Extension Limitations  2 sets    Rows  15 reps;Green;Limitations    Rows Limitations  2 sets      Neck Exercises: Standing   Wall Wash  Bil UE flexion with chin tuck posture, 15 reps    Thumb Tacks  15 reps with back to wall, bil UE      Neck Exercises: Seated   Neck Retraction  15 reps;5 secs;Limitations    Neck Retraction Limitations  2 sets; red TB    Other Seated Exercise  upper trap stretch (Lt side): Lt hand holding on seat, 3x 20 seconds      Manual Therapy   Manual Therapy  Soft tissue mobilization    Manual therapy comments  completed separately from other skilled interventions    Soft tissue mobilization  Soft tissue mobilization to patient's left paraspinals and upper trapezius. Patient in sitting.          PT Education - 09/10/17 1705    Education provided  Yes    Education Details  Educated on exercsies throughout and updated HEP. Discussed benefits of  therapeutic massage.    Person(s) Educated  Patient    Methods  Handout;Explanation;Verbal cues    Comprehension  Verbalized understanding;Returned demonstration       PT Short Term Goals - 08/24/17 1320      PT SHORT TERM GOAL #1   Title  Patient will be independent with HEP to improve cervical ROM/mobility and reduce fascial restrictions to decresae pain.    Time  2    Period  Weeks    Status  New    Target Date  09/05/17      PT SHORT TERM GOAL #2   Title  Patient will report maximum pain of 3/10 at night and greater ease of falling to sleep to improve overall QOL by reducing pain/fatigue levels.    Time  2    Period  Weeks    Status  New      PT SHORT TERM GOAL #3   Title  Patient will initiate process of acquirng assistance with caregiver duties within means to incresae personal time for return to gym attendence or enjoyable leisure activities.     Time  2    Period  Weeks    Status  New        PT Long Term Goals - 08/22/17 1821      PT LONG TERM GOAL #1   Title  Cervical ROM wil improve by 8 degrees up to WNL's for all imited motions to demonstrate improved joint mobility and decreased myofascial restrictions to facilitate reduced pain.    Time  4    Period  Weeks    Status  New    Target Date  09/19/17      PT LONG TERM GOAL #2   Title  Patient will report particiaption in gym routine 2x per week or more to incresae functional activity level for overall health/wellness and progress towards PLOF.    Time  4    Period  Weeks    Status  New      PT LONG TERM GOAL #3   Title  Patient will report being able to sleep on Right and Left side at night with no increase in pain/discomfort to improve ease of falling to sleep and overall QOL.    Time  4    Period  Weeks    Status  New         Plan -  09/10/17 1706    Clinical Impression Statement  Session focused work on postural strengthening and improving mobility of cervical/thoracic spine and shoulders. Patient  has demonstrated good form with theraband postural strengthening, therefore exercises added to HEP and resistance was progressed. She continued to demonstrate good form with cervical retraction with minimal cues for form and progressed to resisted cervical retraction with theraband. Patient was educated on benefits of therapeutic massage given excessive tension/tightness in shoulder/cervical musculature as an additional resource to manage pain. She will continue to benefit from skilled PT services to address current impairments and reduce pain to return to PLOF.    PT Frequency  2x / week    PT Duration  4 weeks    PT Treatment/Interventions  ADLs/Self Care Home Management;Therapeutic activities;Therapeutic exercise;Traction;Moist Heat;Neuromuscular re-education;Patient/family education;Manual techniques;Passive range of motion;Dry needling    PT Next Visit Plan  Continue to progress postural exercises and self thoracic mobilization.  Gradually introduce manual technqiues and provide handout on massage therapists locally.  Add 3D thoracic excursion next session.  Follow up on caregiver/ home aid resources.    PT Home Exercise Plan  Decompression exercises; 08/30/17 - money, corner stretch; 09/04/17:  decompression with RTB; 09/10/17 - rows and extesion with green TB;     Consulted and Agree with Plan of Care  Patient       Patient will benefit from skilled therapeutic intervention in order to improve the following deficits and impairments:  Decreased mobility, Decreased range of motion, Postural dysfunction, Pain, Improper body mechanics, Increased fascial restricitons, Hypomobility  Visit Diagnosis: Cervicalgia  Left shoulder pain, unspecified chronicity  Abnormal posture     Problem List Patient Active Problem List   Diagnosis Date Noted  . Prediabetes 08/30/2017  . Dyspepsia 09/29/2016  . Colon polyps 08/23/2016  . Family history of cancer 08/23/2016  . HLD (hyperlipidemia) 08/23/2016   . Obesity, Class II, BMI 35-39.9, with comorbidity 08/23/2016  . GERD (gastroesophageal reflux disease) 08/14/2016  . Hypothyroid 08/14/2016  . Perimenopausal vasomotor symptoms 02/11/2016  . Postmenopausal atrophic vaginitis 11/09/2015    Kipp Brood, PT, DPT Physical Therapist with [facility]  09/10/2017 6:03 PM    [place], [place], [address] Phone: [phone]   Fax:  [phone]  Name: Znya Albino MRN: [id number] Date of Birth: December 16, 1945

## 2017-09-12 ENCOUNTER — Encounter (HOSPITAL_COMMUNITY): Payer: Self-pay

## 2017-09-12 ENCOUNTER — Other Ambulatory Visit: Payer: Self-pay

## 2017-09-12 ENCOUNTER — Ambulatory Visit (HOSPITAL_COMMUNITY): Payer: Medicare Other | Attending: Orthopedic Surgery

## 2017-09-12 DIAGNOSIS — M25512 Pain in left shoulder: Secondary | ICD-10-CM | POA: Insufficient documentation

## 2017-09-12 DIAGNOSIS — M542 Cervicalgia: Secondary | ICD-10-CM

## 2017-09-12 DIAGNOSIS — R293 Abnormal posture: Secondary | ICD-10-CM

## 2017-09-12 NOTE — Therapy (Signed)
[place], [place], [address] Phone: [phone]   Fax:  [phone]  Physical Therapy Treatment  Patient Details  Name: Kara [name] MRN: [id number] Date of Birth: 03-Mar-1946 Referring Provider: Arther Abbott, MD   Encounter Date: 09/12/2017  PT End of Session - 09/12/17 1721    Visit Number  7    Number of Visits  9    Date for PT Re-Evaluation  09/19/17    Authorization Type  Medicare Part A and B; [employer] secondary (75 visit limit)    Authorization Time Period  08/22/17-09/19/17    Authorization - Visit Number  6    Authorization - Number of Visits  10    PT Start Time  1648    PT Stop Time  1730    PT Time Calculation (min)  42 min    Activity Tolerance  Patient tolerated treatment well    Behavior During Therapy  [facility] for tasks assessed/performed       Past Medical History:  Diagnosis Date  . Allergy    food allergies, drug allergies  . Anemia   . Anxiety   . Cataract   . Diabetes mellitus without complication (HCC)    no medication   . GERD (gastroesophageal reflux disease)   . Hyperlipidemia   . Thyroid disease     Past Surgical History:  Procedure Laterality Date  . ABDOMINAL HYSTERECTOMY     bleeding. fibroids  . CESAREAN SECTION    . CHOLECYSTECTOMY    . COLONOSCOPY  2011   [place]: two 3-4 mm sessile polyps, hyperplastic, sigmoid diverticula, TI appeared normal.   . ESOPHAGOGASTRODUODENOSCOPY  2011   [place]: non-bleeding erosive gastropathy, normal duodenum. path: unremarkable duodenum, negative H.pylori, minimal esophagitis   . TONSILLECTOMY  1975    There were no vitals filed for this visit.  Subjective Assessment - 09/12/17 1725    Subjective  Patient reports she is feeling well today and went to the gym, this morning. She states she walked ont eh treadmill for 1.5 miles and it was the hightlight of her day. She reports she has been eprfomring her HEP daily  again this week and that she does not have any headache. She states last night when she woke up with pain in her left shoulder she performed her shoulder rolls and it helped so she did not need to get up formedicine.    Pertinent History  Coconut allergy, wears compression stockings for Bil LE edema    Limitations  Other (comment) sleeping    Patient Stated Goals  be able to sleep more easily and get back to the gym    Currently in Pain?  No/denies    Pain Onset  --         [facility] Adult PT Treatment/Exercise - 09/12/17 0001      Neck Exercises: Machines for Strengthening   UBE (Upper Arm Bike)  4 minutes on level 2, retrograde for postural strengthening      Neck Exercises: Theraband   Shoulder Extension  15 reps;Green;Limitations    Shoulder Extension Limitations  2 sets    Rows  15 reps;Green;Limitations    Rows Limitations  2 sets      Neck Exercises: Standing   Upper Extremity D1  Extension;15 reps;Theraband    Theraband Level (UE D1)  Level 2 (Red)      Neck Exercises: Seated   Neck Retraction  15 reps;5 secs;Limitations  Neck Retraction Limitations  2 sets; red TB    Money  15 reps;3 secs;Limitations    Money Limitations  Red TB; 2 sets    Shoulder Rolls  Backwards;15 reps;Limitations;Forwards    Shoulder Rolls Limitations  up, back/retract and relax    Other Seated Exercise  upper trap stretch (Lt side): Lt hand holding on seat, 3x 20 seconds      Neck Exercises: Supine   Other Supine Exercise  Thoracic Self mobilization: 3 segments with half foam roll uner back, 5 reps overhead flexion with Bil UE at each segment      Neck Exercises: Stretches   Corner Stretch  10 seconds;Limitations    Corner Stretch Limitations  10 reps        PT Education - 09/12/17 1720    Education provided  Yes    Education Details  Educated on exercises throughout session. provided handout on therapeutic massage and local therapists.    Person(s) Educated  Patient    Methods   Explanation;Handout    Comprehension  Verbalized understanding       PT Short Term Goals - 08/24/17 1320      PT SHORT TERM GOAL #1   Title  Patient will be independent with HEP to improve cervical ROM/mobility and reduce fascial restrictions to decresae pain.    Time  2    Period  Weeks    Status  New    Target Date  09/05/17      PT SHORT TERM GOAL #2   Title  Patient will report maximum pain of 3/10 at night and greater ease of falling to sleep to improve overall QOL by reducing pain/fatigue levels.    Time  2    Period  Weeks    Status  New      PT SHORT TERM GOAL #3   Title  Patient will initiate process of acquirng assistance with caregiver duties within means to incresae personal time for return to gym attendence or enjoyable leisure activities.     Time  2    Period  Weeks    Status  New        PT Long Term Goals - 08/22/17 1821      PT LONG TERM GOAL #1   Title  Cervical ROM wil improve by 8 degrees up to WNL's for all imited motions to demonstrate improved joint mobility and decreased myofascial restrictions to facilitate reduced pain.    Time  4    Period  Weeks    Status  New    Target Date  09/19/17      PT LONG TERM GOAL #2   Title  Patient will report particiaption in gym routine 2x per week or more to incresae functional activity level for overall health/wellness and progress towards PLOF.    Time  4    Period  Weeks    Status  New      PT LONG TERM GOAL #3   Title  Patient will report being able to sleep on Right and Left side at night with no increase in pain/discomfort to improve ease of falling to sleep and overall QOL.    Time  4    Period  Weeks    Status  New         Plan - 09/12/17 1722    Clinical Impression Statement  Patient arrived with no pain today and denied pain throughout the session. She reports her theraband exercises are  going well at home and additional D1 exercise added today with good form and minimal cues required to  maintain good posture throughout. She continued with resisted cervical retraction and light cervical excursion ROM exercises were added. She was provided a handout on local massage therapists to further address muscle tightness/restrictions and reduce pain. She will continue to benefit from skilled PT services to address current impairments and reduce pain to return to PLOF.    PT Frequency  2x / week    PT Duration  4 weeks    PT Treatment/Interventions  ADLs/Self Care Home Management;Therapeutic activities;Therapeutic exercise;Traction;Moist Heat;Neuromuscular re-education;Patient/family education;Manual techniques;Passive range of motion;Dry needling    PT Next Visit Plan  Continue to progress postural exercises and self thoracic mobilization. Continue to perform manual techniques. Add 3D thoracic excursion next session.     PT Home Exercise Plan  Decompression exercises; 08/30/17 - money, corner stretch; 09/04/17:  decompression with RTB; 09/10/17 - rows and extesion with green TB;     Consulted and Agree with Plan of Care  Patient       Patient will benefit from skilled therapeutic intervention in order to improve the following deficits and impairments:  Decreased mobility, Decreased range of motion, Postural dysfunction, Pain, Improper body mechanics, Increased fascial restricitons, Hypomobility  Visit Diagnosis: Cervicalgia  Left shoulder pain, unspecified chronicity  Abnormal posture     Problem List Patient Active Problem List   Diagnosis Date Noted  . Prediabetes 08/30/2017  . Dyspepsia 09/29/2016  . Colon polyps 08/23/2016  . Family history of cancer 08/23/2016  . HLD (hyperlipidemia) 08/23/2016  . Obesity, Class II, BMI 35-39.9, with comorbidity 08/23/2016  . GERD (gastroesophageal reflux disease) 08/14/2016  . Hypothyroid 08/14/2016  . Perimenopausal vasomotor symptoms 02/11/2016  . Postmenopausal atrophic vaginitis 11/09/2015    Kipp Brood, PT, DPT Physical  Therapist with [facility]  09/12/2017 5:31 PM    [place], [place], [address] Phone: [phone]   Fax:  [phone]  Name: Kara [name] MRN: [id number] Date of Birth: 06-Oct-1945

## 2017-09-13 ENCOUNTER — Ambulatory Visit: Payer: Medicare Other | Admitting: Obstetrics and Gynecology

## 2017-09-14 DIAGNOSIS — H401221 Low-tension glaucoma, left eye, mild stage: Secondary | ICD-10-CM | POA: Diagnosis not present

## 2017-09-14 DIAGNOSIS — H40021 Open angle with borderline findings, high risk, right eye: Secondary | ICD-10-CM | POA: Diagnosis not present

## 2017-09-17 ENCOUNTER — Encounter (HOSPITAL_COMMUNITY): Payer: Self-pay

## 2017-09-18 ENCOUNTER — Encounter (HOSPITAL_COMMUNITY): Payer: Self-pay | Admitting: Physical Therapy

## 2017-09-18 ENCOUNTER — Ambulatory Visit (HOSPITAL_COMMUNITY): Payer: Medicare Other | Admitting: Physical Therapy

## 2017-09-18 DIAGNOSIS — M25512 Pain in left shoulder: Secondary | ICD-10-CM | POA: Diagnosis not present

## 2017-09-18 DIAGNOSIS — R293 Abnormal posture: Secondary | ICD-10-CM | POA: Diagnosis not present

## 2017-09-18 DIAGNOSIS — M542 Cervicalgia: Secondary | ICD-10-CM

## 2017-09-18 NOTE — Therapy (Signed)
[place], [place], [address] Phone: [phone]   Fax:  [phone]  Physical Therapy Treatment  Patient Details  Name: Kara [name] MRN: [id number] Date of Birth: 01/22/1946 Referring Provider: Arther Abbott, MD   Encounter Date: 09/18/2017  PT End of Session - 09/18/17 1309    Visit Number  8    Number of Visits  9    Date for PT Re-Evaluation  09/19/17    Authorization Type  Medicare Part A and B; [employer] secondary (75 visit limit)    Authorization Time Period  08/22/17-09/19/17    Authorization - Visit Number  8    Authorization - Number of Visits  10    PT Start Time  1302    PT Stop Time  1345    PT Time Calculation (min)  43 min    Activity Tolerance  Patient tolerated treatment well    Behavior During Therapy  [facility] for tasks assessed/performed       Past Medical History:  Diagnosis Date  . Allergy    food allergies, drug allergies  . Anemia   . Anxiety   . Cataract   . Diabetes mellitus without complication (HCC)    no medication   . GERD (gastroesophageal reflux disease)   . Hyperlipidemia   . Thyroid disease     Past Surgical History:  Procedure Laterality Date  . ABDOMINAL HYSTERECTOMY     bleeding. fibroids  . CESAREAN SECTION    . CHOLECYSTECTOMY    . COLONOSCOPY  2011   [place]: two 3-4 mm sessile polyps, hyperplastic, sigmoid diverticula, TI appeared normal.   . ESOPHAGOGASTRODUODENOSCOPY  2011   [place]: non-bleeding erosive gastropathy, normal duodenum. path: unremarkable duodenum, negative H.pylori, minimal esophagitis   . TONSILLECTOMY  1975    There were no vitals filed for this visit.  Subjective Assessment - 09/18/17 1304    Subjective  PT states she can tell she's getting better.  Currently wtihout pain.  Continues to go to the gym everyday and completing her HEP.    Patient is accompained by:  Family member    Currently in Pain?  No/denies                        [facility] Adult PT Treatment/Exercise - 09/18/17 0001      Neck Exercises: Machines for Strengthening   UBE (Upper Arm Bike)  4 minutes on level 2, retrograde for postural strengthening      Neck Exercises: Theraband   Shoulder Extension  15 reps;Green;Limitations    Shoulder Extension Limitations  2 sets    Rows  15 reps;Green;Limitations    Rows Limitations  2 sets      Neck Exercises: Standing   Upper Extremity D1  Extension;15 reps;Theraband    Theraband Level (UE D1)  Level 2 (Red)    [place]  Bil UE flexion, laterals, CW  30" each direction      Neck Exercises: Seated   Neck Retraction  15 reps;5 secs;Limitations    Neck Retraction Limitations  2 sets; red TB    Money  15 reps;3 secs;Limitations    Shoulder Rolls  Backwards;15 reps;Limitations;Forwards    Shoulder Rolls Limitations  up, back/retract and relax    Other Seated Exercise  upper trap stretch (Lt side): Lt hand holding on seat, 3x 20 seconds      Neck Exercises: Supine   Other Supine  Exercise  Thoracic Self mobilization: 3 segments with half foam roll uner back, 5 reps overhead flexion with Bil UE at each segment      Manual Therapy   Manual Therapy  Soft tissue mobilization    Manual therapy comments  completed separately from other skilled interventions    Soft tissue mobilization  Soft tissue mobilization to patient's left paraspinals and upper trapezius. Patient in sitting.       Neck Exercises: Stretches   Corner Stretch  10 seconds;Limitations    Warehouse manager Limitations  10 reps               PT Short Term Goals - 08/24/17 1320      PT SHORT TERM GOAL #1   Title  Patient will be independent with HEP to improve cervical ROM/mobility and reduce fascial restrictions to decresae pain.    Time  2    Period  Weeks    Status  New    Target Date  09/05/17      PT SHORT TERM GOAL #2   Title  Patient will report maximum pain of 3/10 at night and greater ease of  falling to sleep to improve overall QOL by reducing pain/fatigue levels.    Time  2    Period  Weeks    Status  New      PT SHORT TERM GOAL #3   Title  Patient will initiate process of acquirng assistance with caregiver duties within means to incresae personal time for return to gym attendence or enjoyable leisure activities.     Time  2    Period  Weeks    Status  New        PT Long Term Goals - 08/22/17 1821      PT LONG TERM GOAL #1   Title  Cervical ROM wil improve by 8 degrees up to WNL's for all imited motions to demonstrate improved joint mobility and decreased myofascial restrictions to facilitate reduced pain.    Time  4    Period  Weeks    Status  New    Target Date  09/19/17      PT LONG TERM GOAL #2   Title  Patient will report particiaption in gym routine 2x per week or more to incresae functional activity level for overall health/wellness and progress towards PLOF.    Time  4    Period  Weeks    Status  New      PT LONG TERM GOAL #3   Title  Patient will report being able to sleep on Right and Left side at night with no increase in pain/discomfort to improve ease of falling to sleep and overall QOL.    Time  4    Period  Weeks    Status  New            Plan - 09/18/17 1450    Clinical Impression Statement  Continue to progress cervical ROM and strength without pain expressed during session.  PT required verbal and manual cues to complete in correct form and utilize correct posturing.  Pt hypersensitive to manual completed in Lt upper trap region..  Pt with some tightness and small spasm in Lt UT, however unable to resolve due to pateint's difficulty to relax mm.      PT Frequency  2x / week    PT Duration  4 weeks    PT Treatment/Interventions  ADLs/Self Care Home Management;Therapeutic activities;Therapeutic exercise;Traction;Moist Heat;Neuromuscular  re-education;Patient/family education;Manual techniques;Passive range of motion;Dry needling    PT Next  Visit Plan  re-assess next session.     PT Home Exercise Plan  Decompression exercises; 08/30/17 - money, corner stretch; 09/04/17:  decompression with RTB; 09/10/17 - rows and extesion with green TB;     Consulted and Agree with Plan of Care  Patient       Patient will benefit from skilled therapeutic intervention in order to improve the following deficits and impairments:  Decreased mobility, Decreased range of motion, Postural dysfunction, Pain, Improper body mechanics, Increased fascial restricitons, Hypomobility  Visit Diagnosis: Cervicalgia  Left shoulder pain, unspecified chronicity  Abnormal posture     Problem List Patient Active Problem List   Diagnosis Date Noted  . Prediabetes 08/30/2017  . Dyspepsia 09/29/2016  . Colon polyps 08/23/2016  . Family history of cancer 08/23/2016  . HLD (hyperlipidemia) 08/23/2016  . Obesity, Class II, BMI 35-39.9, with comorbidity 08/23/2016  . GERD (gastroesophageal reflux disease) 08/14/2016  . Hypothyroid 08/14/2016  . Perimenopausal vasomotor symptoms 02/11/2016  . Postmenopausal atrophic vaginitis 11/09/2015   Teena Irani, PTA/CLT [phone]  Teena Irani 09/18/2017, 2:54 PM  [place], [place], [address] Phone: [phone]   Fax:  [phone]  Name: Kara [name] MRN: [id number] Date of Birth: [birth year]

## 2017-09-19 ENCOUNTER — Ambulatory Visit (HOSPITAL_COMMUNITY): Payer: Medicare Other

## 2017-09-19 ENCOUNTER — Encounter (HOSPITAL_COMMUNITY): Payer: Self-pay

## 2017-09-19 ENCOUNTER — Other Ambulatory Visit: Payer: Self-pay

## 2017-09-19 DIAGNOSIS — M542 Cervicalgia: Secondary | ICD-10-CM | POA: Diagnosis not present

## 2017-09-19 DIAGNOSIS — M25512 Pain in left shoulder: Secondary | ICD-10-CM

## 2017-09-19 DIAGNOSIS — R293 Abnormal posture: Secondary | ICD-10-CM

## 2017-09-19 NOTE — Therapy (Signed)
[facility] [address] [place], [place], [address] Phone: [phone]   Fax:  [phone]  Physical Therapy Treatment/Discharge Summary  Patient Details  Name: Kara [name] MRN: [id number] Date of Birth: 09/19/1945 Referring Provider: Carole Civil, MD   Encounter Date: 09/19/2017   Progress Note Reporting Period 08/22/17 to 09/19/17  See note below for Objective Data and Assessment of Progress/Goals.    PHYSICAL THERAPY DISCHARGE SUMMARY  Visits from Start of Care: 9  Current functional level related to goals / functional outcomes: Re-assessment performed today and Kara [name] has partially met/met 2/3 short term and long term goals. She has made good improvements with ROM and has returned to gym participation. She reports feeling about 50% improved since starting therapy and is confident she can continue progressing with her exercises. She has expressed an understanding of exercise benefits and importance as well as the potential benefits of massage therapy to promote well being and relaxation as well as reduce stress. Kara [name] will be discharged from therapy based on her current status towards goals and return to PLOF with exercise routine.   Remaining deficits: See below details.   Education / Equipment: Educated on progress towards goals and on readiness for discharge. Educated on chin tucks as addition for HEP and on importance of continuing with gym routine.  Plan: Patient agrees to discharge.  Patient goals were partially met. Patient is being discharged due to meeting the stated rehab goals.  ?????       PT End of Session - 09/19/17 1414    Visit Number  9    Number of Visits  9    Date for PT Re-Evaluation  09/19/17    Authorization Type  Medicare Part A and B; [employer] secondary (75 visit limit)    Authorization Time Period  08/22/17-09/19/17    Authorization - Visit Number  9    Authorization - Number of Visits   10    PT Start Time  1351    PT Stop Time  1415    PT Time Calculation (min)  24 min    Activity Tolerance  Patient tolerated treatment well    Behavior During Therapy  WFL for tasks assessed/performed       Past Medical History:  Diagnosis Date  . Allergy    food allergies, drug allergies  . Anemia   . Anxiety   . Cataract   . Diabetes mellitus without complication (HCC)    no medication   . GERD (gastroesophageal reflux disease)   . Hyperlipidemia   . Thyroid disease     Past Surgical History:  Procedure Laterality Date  . ABDOMINAL HYSTERECTOMY     bleeding. fibroids  . CESAREAN SECTION    . CHOLECYSTECTOMY    . COLONOSCOPY  2011   [place]: two 3-4 mm sessile polyps, hyperplastic, sigmoid diverticula, TI appeared normal.   . ESOPHAGOGASTRODUODENOSCOPY  2011   [place]: non-bleeding erosive gastropathy, normal duodenum. path: unremarkable duodenum, negative H.pylori, minimal esophagitis   . TONSILLECTOMY  1975    There were no vitals filed for this visit.  Subjective Assessment - 09/19/17 1354    Subjective  She reports she went to a tai chi class this morning at the [facility] and she is sore from that as well as from therapy yesterday. Overall she states her neck is better but shoulder is about the same as when she started therapy. She feels like she has made a 50% improvement  since starting and states the exercises relieve her pain somewhat when she does them. She states overall sleeping still varies in ease, however she is sleeping through more nights than she was previously. She reports that she plans to continue with her gym participation and start getting massages to help with her muscle tension in her shouders.    Patient is accompained by:  Family member    Pertinent History  Coconut allergy, wears compression stockings for Bil LE edema    How long can you sit comfortably?  unlimited by neck    How long can you stand comfortably?  unlimited by neck    How long can  you walk comfortably?  unlimited by neck    Patient Stated Goals  be able to sleep more easily and get back to the gym    Currently in Pain?  Yes    Pain Score  5     Pain Location  Shoulder neck to shoulder    Pain Orientation  Left;Lower    Pain Descriptors / Indicators  Aching;Dull    Pain Type  Chronic pain    Pain Onset  More than a month ago    Pain Frequency  Intermittent         [facility] PT Assessment - 09/19/17 0001      Assessment   Medical Diagnosis  Neck and Shoulder Pain    Referring Provider  Carole Civil, MD    Next MD Visit  none    Prior Therapy  ~15 years ago      Precautions   Precautions  None      Restrictions   Weight Bearing Restrictions  No      Cognition   Overall Cognitive Status  Within Functional Limits for tasks assessed      Observation/Other Assessments   Focus on Therapeutic Outcomes (FOTO)   39% limited was 22% limited      AROM   AROM Assessment Site  Cervical    Cervical Flexion  55 WNL    Cervical Extension  60 WNL    Cervical - Right Side Bend  35 WNL    Cervical - Left Side Bend  34 WNL    Cervical - Right Rotation  75    Cervical - Left Rotation  74       [facility] Adult PT Treatment/Exercise - 09/19/17 0001      Neck Exercises: Seated   Neck Retraction  15 reps;5 secs;Limitations        PT Education - 09/19/17 1430    Education provided  Yes    Education Details  Educated on progress towards goals and on readiness for discharge. Educated on chin tucks as addition for HEP and on importance of continuing with gym routine.    Person(s) Educated  Patient    Methods  Explanation;Handout    Comprehension  Verbalized understanding;Returned demonstration       PT Short Term Goals - 09/19/17 1358      PT SHORT TERM GOAL #1   Title  Patient will be independent with HEP to improve cervical ROM/mobility and reduce fascial restrictions to decresae pain.    Time  2    Period  Weeks    Status  Achieved      PT SHORT TERM  GOAL #2   Title  Patient will report maximum pain of 3/10 at night and greater ease of falling to sleep to improve overall QOL by reducing pain/fatigue levels.  Baseline  09/19/17 - 8-9/10 at max before falling asleep, lowest it gets is 3-4/10 (spends more time in the 3-4 range)    Time  2    Period  Weeks    Status  Not Met      PT SHORT TERM GOAL #3   Title  Patient will initiate process of acquirng assistance with caregiver duties within means to incresae personal time for return to gym attendence or enjoyable leisure activities.     Baseline  09/19/17 - has returned to the gym regularly but has not decided to get caregiver assistnace yet to hlep with her mother, she may do so in the future and stillh as the information provded on home aids    Time  2    Period  Weeks    Status  Partially Met        PT Long Term Goals - 09/19/17 1401      PT LONG TERM GOAL #1   Title  Cervical ROM wil improve by 8 degrees up to WNL's for all imited motions to demonstrate improved joint mobility and decreased myofascial restrictions to facilitate reduced pain.    Baseline  09/19/17 - improved for some planes of motion    Time  4    Period  Weeks    Status  Partially Met      PT LONG TERM GOAL #2   Title  Patient will report particiaption in gym routine 2x per week or more to incresae functional activity level for overall health/wellness and progress towards PLOF.    Baseline  09/19/17 - has gone 2x/week for several weeks, 1x last week, and every day so far this week    Time  4    Period  Weeks    Status  Achieved      PT LONG TERM GOAL #3   Title  Patient will report being able to sleep on Right and Left side at night with no increase in pain/discomfort to improve ease of falling to sleep and overall QOL.    Baseline  09/19/17 - can't sleep on side,  but is managing better overall with sleeping on her back    Time  4    Period  Weeks    Status  Not Met       Plan - 09/19/17 1431    Clinical  Impression Statement  Re-assessment performed today and Kara [name] has partially met/met 2/3 short term and long term goals. She has made good improvements with ROM and has returned to gym participation. She reports feeling about 50% improved since starting therapy and is confident she can continue progressing with her exercises. She has expressed an understanding of exercise benefits and importance as well as the potential benefits of massage therapy to promote well being and relaxation as well as reduce stress. Kara [name] will be discharged from therapy based on her current status towards goals and return to PLOF with exercise routine.    PT Frequency  2x / week    PT Duration  4 weeks    PT Treatment/Interventions  ADLs/Self Care Home Management;Therapeutic activities;Therapeutic exercise;Traction;Moist Heat;Neuromuscular re-education;Patient/family education;Manual techniques;Passive range of motion;Dry needling    PT Next Visit Plan  discharge this session    PT Home Exercise Plan  Decompression exercises; 08/30/17 - money, corner stretch; 09/04/17:  decompression with RTB; 09/10/17 - rows and extesion with green TB; 09/19/17 - chin tucks    Consulted and Agree with Plan of Care  Patient  Patient will benefit from skilled therapeutic intervention in order to improve the following deficits and impairments:  Decreased mobility, Decreased range of motion, Postural dysfunction, Pain, Improper body mechanics, Increased fascial restricitons, Hypomobility  Visit Diagnosis: Cervicalgia  Left shoulder pain, unspecified chronicity  Abnormal posture     Problem List Patient Active Problem List   Diagnosis Date Noted  . Prediabetes 08/30/2017  . Dyspepsia 09/29/2016  . Colon polyps 08/23/2016  . Family history of cancer 08/23/2016  . HLD (hyperlipidemia) 08/23/2016  . Obesity, Class II, BMI 35-39.9, with comorbidity 08/23/2016  . GERD (gastroesophageal reflux disease) 08/14/2016  . Hypothyroid  08/14/2016  . Perimenopausal vasomotor symptoms 02/11/2016  . Postmenopausal atrophic vaginitis 11/09/2015    Kipp Brood, PT, DPT Physical Therapist with [facility]  09/19/2017 2:37 PM    [facility] [place], [place], [address] Phone: [phone]   Fax:  [phone]  Name: March Joos MRN: [id number] Date of Birth: [birth year]

## 2017-10-10 ENCOUNTER — Encounter: Payer: Self-pay | Admitting: Obstetrics and Gynecology

## 2017-10-10 ENCOUNTER — Ambulatory Visit (INDEPENDENT_AMBULATORY_CARE_PROVIDER_SITE_OTHER): Payer: Medicare Other | Admitting: Obstetrics and Gynecology

## 2017-10-10 VITALS — BP 120/80 | HR 98 | Ht 66.0 in | Wt 224.0 lb

## 2017-10-10 DIAGNOSIS — N951 Menopausal and female climacteric states: Secondary | ICD-10-CM | POA: Diagnosis not present

## 2017-10-10 DIAGNOSIS — N952 Postmenopausal atrophic vaginitis: Secondary | ICD-10-CM

## 2017-10-10 NOTE — Progress Notes (Signed)
Patient ID: Kara [name], female   DOB: 1945-08-12, 72 y.o.   MRN: [id number]   [facility] Visit  @DATE @            Patient name: Kara [name] MRN [id number]  Date of birth: 04-01-1946  CC & HPI:  Kara [name] is a 72 y.o. female presenting today to discuss her estradiol patch. She has a history of atrophic vaginitis. The patch has been working for her at it's current dose. It has been maintaining her hot flashes and night sweats. She is wondering if it is time to start decreasing the dose. She does not want to stop it, but is wondering how she will do on a lower dose. She also has been experiencing a lot of stress in her life. She has been taking care of her mother and aunt who are both in their 41's. Donata continues to go to the gym, but adds that her weight has gone up 5 pounds in the last year. The patient denies fever chill or any other symptoms or complaints at this time.   ROS:  ROS +atrophic vaginitis +stress -hot flashes -night sweats -fever -chills All systems are negative except as noted in the HPI and PMH.   Pertinent History Reviewed:   Reviewed: Significant for abdominal hysterectomy Medical         Past Medical History:  Diagnosis Date  . Allergy    food allergies, drug allergies  . Anemia   . Anxiety   . Cataract   . Diabetes mellitus without complication (HCC)    no medication   . GERD (gastroesophageal reflux disease)   . Hyperlipidemia   . Thyroid disease                               Surgical Hx:    Past Surgical History:  Procedure Laterality Date  . ABDOMINAL HYSTERECTOMY     bleeding. fibroids  . CESAREAN SECTION    . CHOLECYSTECTOMY    . COLONOSCOPY  2011   [place]: two 3-4 mm sessile polyps, hyperplastic, sigmoid diverticula, TI appeared normal.   . ESOPHAGOGASTRODUODENOSCOPY  2011   [place]: non-bleeding erosive gastropathy, normal duodenum. path: unremarkable duodenum, negative H.pylori, minimal esophagitis   . TONSILLECTOMY   1975   Medications: Reviewed & Updated - see associated section                       Current Outpatient Medications:  .  estradiol (VIVELLE-DOT) 0.0375 MG/24HR, Place 1 patch onto the skin 2 (two) times a week., Disp: 8 patch, Rfl: 12 .  glucose blood test strip, 1 each by Other route 2 (two) times daily. Use as instructed, Disp: 100 each, Rfl: 5 .  levothyroxine (SYNTHROID, LEVOTHROID) 112 MCG tablet, Take 1 tablet (112 mcg total) by mouth daily., Disp: 90 tablet, Rfl: 3 .  albuterol (PROVENTIL HFA;VENTOLIN HFA) 108 (90 Base) MCG/ACT inhaler, Inhale 2 puffs into the lungs every 4 (four) hours as needed for wheezing or shortness of breath. (Patient not taking: Reported on 10/10/2017), Disp: 1 Inhaler, Rfl: 0 .  EPINEPHrine 0.3 mg/0.3 mL IJ SOAJ injection, Inject 0.3 mLs (0.3 mg total) into the skin as needed. (Patient not taking: Reported on 10/10/2017), Disp: 1 Device, Rfl: 1 .  lansoprazole (PREVACID) 15 MG capsule, Take 15 mg by mouth daily as needed. For relief , Disp: , Rfl:    Social History: Reviewed -  reports that she has quit smoking. She has never used smokeless tobacco.  Objective Findings:  Vitals: Blood pressure 120/80, pulse 98, height 5\' 6"  (1.676 m), weight 224 lb (101.6 kg).  PHYSICAL EXAMINATION General appearance - alert, well appearing, and in no distress and oriented to person, place, and time Mental status - alert, oriented to person, place, and time, normal mood, behavior, speech, dress, motor activity, and thought processes, affect appropriate to mood  PELVIC DEFERRED  Discussion: 1. Discussed with pt risks and benefits of decreasing the dose of her estradiol patch or discontinuing it all together.   At end of discussion, pt had opportunity to ask questions and has no further questions at this time.   Specific discussion of discontinuing her estradiol patch as noted above. Greater than 50% was spent in counseling and coordination of care with the patient.    Total time greater than: 25 minutes.    Assessment & Plan:   A:  1. Hx of atrophic vaginitis 2. postmenpausal estrogen therapy  P:  1. Lower dose of estradiol from current 0.0375 , pt to call with her response to tx change 2. She will call after 30 days 3. F/u 1 year or PRN  By signing my name below, I, Margit Banda, attest that this documentation has been prepared under the direction and in the presence of Jonnie Kind, MD. Electronically Signed: Margit Banda, Medical Scribe. 10/10/17. 10:42 AM.  I personally performed the services described in this documentation, which was SCRIBED in my presence. The recorded information has been reviewed and considered accurate. It has been edited as necessary during review. Jonnie Kind, MD

## 2017-10-11 MED ORDER — ESTRADIOL 0.025 MG/24HR TD PTTW: 1.0000 | MEDICATED_PATCH | TRANSDERMAL | 12 refills | Status: DC

## 2017-10-16 ENCOUNTER — Other Ambulatory Visit: Payer: Self-pay | Admitting: Family Medicine

## 2017-10-18 ENCOUNTER — Encounter: Payer: Self-pay | Admitting: Family Medicine

## 2017-10-19 ENCOUNTER — Encounter: Payer: Self-pay | Admitting: Family Medicine

## 2017-10-19 DIAGNOSIS — D229 Melanocytic nevi, unspecified: Secondary | ICD-10-CM | POA: Diagnosis not present

## 2017-10-31 DIAGNOSIS — L820 Inflamed seborrheic keratosis: Secondary | ICD-10-CM | POA: Diagnosis not present

## 2017-10-31 DIAGNOSIS — B078 Other viral warts: Secondary | ICD-10-CM | POA: Diagnosis not present

## 2017-11-02 ENCOUNTER — Telehealth: Payer: Self-pay | Admitting: Obstetrics & Gynecology

## 2017-11-02 NOTE — Telephone Encounter (Signed)
Patient called, stated that she saw Dr. Glo Herring 3 weeks ago and he lowered her mediation dosage, medication Estradiol.  She is having heart palpitations and nervousness.  She is wondering if it's due to the medication.  She thinks the dosage needs to be adjusted.  [facility] [place]   [phone]

## 2017-11-02 NOTE — Telephone Encounter (Signed)
Patient informed Dr Glo Herring is out of the office today. States the heart palpitations have been going on for about 3 weeks now.  Patient has noticed since Estradiol was decreased.  She has started using the patch again today and wanted to make you aware.  Wants to see if it will make a difference.

## 2017-11-13 DIAGNOSIS — R7301 Impaired fasting glucose: Secondary | ICD-10-CM | POA: Diagnosis not present

## 2017-11-13 DIAGNOSIS — R002 Palpitations: Secondary | ICD-10-CM | POA: Diagnosis not present

## 2017-11-13 DIAGNOSIS — Z6836 Body mass index (BMI) 36.0-36.9, adult: Secondary | ICD-10-CM | POA: Diagnosis not present

## 2017-11-13 DIAGNOSIS — E039 Hypothyroidism, unspecified: Secondary | ICD-10-CM | POA: Diagnosis not present

## 2017-12-03 ENCOUNTER — Ambulatory Visit (HOSPITAL_COMMUNITY)
Admission: RE | Admit: 2017-12-03 | Discharge: 2017-12-03 | Disposition: A | Discharge status: Home / Self Care | Payer: Medicare Other | Source: Ambulatory Visit | Attending: Family Medicine | Admitting: Family Medicine

## 2017-12-03 DIAGNOSIS — I491 Atrial premature depolarization: Secondary | ICD-10-CM | POA: Diagnosis not present

## 2017-12-03 DIAGNOSIS — I493 Ventricular premature depolarization: Secondary | ICD-10-CM | POA: Diagnosis not present

## 2017-12-03 DIAGNOSIS — R002 Palpitations: Secondary | ICD-10-CM | POA: Diagnosis not present

## 2017-12-06 ENCOUNTER — Ambulatory Visit: Payer: Medicare Other | Admitting: Family Medicine

## 2018-01-01 ENCOUNTER — Other Ambulatory Visit: Payer: Self-pay | Admitting: Obstetrics and Gynecology

## 2018-01-02 ENCOUNTER — Other Ambulatory Visit: Payer: Self-pay | Admitting: Obstetrics and Gynecology

## 2018-01-04 ENCOUNTER — Other Ambulatory Visit: Payer: Self-pay | Admitting: Obstetrics and Gynecology

## 2018-01-11 DIAGNOSIS — R7301 Impaired fasting glucose: Secondary | ICD-10-CM | POA: Diagnosis not present

## 2018-01-11 DIAGNOSIS — E039 Hypothyroidism, unspecified: Secondary | ICD-10-CM | POA: Diagnosis not present

## 2018-01-15 ENCOUNTER — Telehealth: Payer: Self-pay | Admitting: Orthopedic Surgery

## 2018-01-15 NOTE — Telephone Encounter (Signed)
Patient called to get an appointment for back pain with Dr. Aline Brochure. After listening for a few minutes I explained that Dr. Aline Brochure is not a back specialist and I suggested that maybe she should see her family doctor and see what they suggest. I also let her know that it would be a few weeks before she could get in to see Dr. Aline Brochure. Stated she has an appointment with her family doctor later this week so she will see what he suggests.

## 2018-01-18 DIAGNOSIS — R7301 Impaired fasting glucose: Secondary | ICD-10-CM | POA: Diagnosis not present

## 2018-01-18 DIAGNOSIS — E039 Hypothyroidism, unspecified: Secondary | ICD-10-CM | POA: Diagnosis not present

## 2018-01-18 DIAGNOSIS — Z0001 Encounter for general adult medical examination with abnormal findings: Secondary | ICD-10-CM | POA: Diagnosis not present

## 2018-01-18 DIAGNOSIS — Z6836 Body mass index (BMI) 36.0-36.9, adult: Secondary | ICD-10-CM | POA: Diagnosis not present

## 2018-01-18 DIAGNOSIS — M25512 Pain in left shoulder: Secondary | ICD-10-CM | POA: Diagnosis not present

## 2018-01-18 DIAGNOSIS — R002 Palpitations: Secondary | ICD-10-CM | POA: Diagnosis not present

## 2018-01-21 DIAGNOSIS — M25562 Pain in left knee: Secondary | ICD-10-CM | POA: Diagnosis not present

## 2018-01-21 DIAGNOSIS — M25512 Pain in left shoulder: Secondary | ICD-10-CM | POA: Diagnosis not present

## 2018-01-21 DIAGNOSIS — M542 Cervicalgia: Secondary | ICD-10-CM | POA: Diagnosis not present

## 2018-02-22 ENCOUNTER — Other Ambulatory Visit: Payer: Self-pay | Admitting: Orthopedic Surgery

## 2018-02-22 DIAGNOSIS — N644 Mastodynia: Secondary | ICD-10-CM

## 2018-02-22 DIAGNOSIS — M25512 Pain in left shoulder: Secondary | ICD-10-CM

## 2018-02-25 ENCOUNTER — Encounter: Payer: Self-pay | Admitting: Obstetrics and Gynecology

## 2018-02-25 ENCOUNTER — Other Ambulatory Visit: Payer: Self-pay | Admitting: *Deleted

## 2018-02-25 ENCOUNTER — Ambulatory Visit (INDEPENDENT_AMBULATORY_CARE_PROVIDER_SITE_OTHER): Payer: Medicare Other | Admitting: Obstetrics and Gynecology

## 2018-02-25 VITALS — BP 100/60 | HR 94 | Ht 66.0 in | Wt 223.6 lb

## 2018-02-25 DIAGNOSIS — N644 Mastodynia: Secondary | ICD-10-CM

## 2018-02-25 DIAGNOSIS — Z809 Family history of malignant neoplasm, unspecified: Secondary | ICD-10-CM | POA: Diagnosis not present

## 2018-02-25 HISTORY — DX: Mastodynia: N64.4

## 2018-02-25 MED ORDER — ESTRADIOL 0.025 MG/24HR TD PTTW: 1.0000 | MEDICATED_PATCH | TRANSDERMAL | 12 refills | Status: DC

## 2018-02-25 NOTE — Progress Notes (Addendum)
Patient ID: Kara [name], female   DOB: 11-28-1945, 72 y.o.   MRN: [id number]    [facility] Visit  @DATE @            Patient name: Kara [name] MRN [id number]  Date of birth: [birth year]  CC & HPI:  [name] is a 72 y.o. female presenting today for left breast pain. Very tender and at times it burn, sore to touch, has had pain several months. She notes there is not a specific place such as near the nipple but the entire breast hurts .Denies changes in appearance in  breast has not changed size or shape. Has been having pain in shoulder and was told it was arthritis and thought pain was travelling down to chest.  Estradiol was reduced from 0.0375 to 0.025 and didn't work well due to her anxiety.  Which she feels is worse when she is on hormone therapy she noticed emotional changes and is back on 0.0375 she does not describe actual hot sweats ROS:  ROS +left breast pain  -fever -chills All systems are negative except as noted in the HPI and PMH.   Pertinent History Reviewed:   Reviewed: Significant for hysterectomy FAMILY HISTORY: Kara [name] IS PATIENTS AUNT. Medical         Past Medical History:  Diagnosis Date  . Allergy    food allergies, drug allergies  . Anemia   . Anxiety   . Cataract   . Diabetes mellitus without complication (HCC)    no medication   . GERD (gastroesophageal reflux disease)   . Hyperlipidemia   . Thyroid disease                               Surgical Hx:    Past Surgical History:  Procedure Laterality Date  . ABDOMINAL HYSTERECTOMY     bleeding. fibroids  . CESAREAN SECTION    . CHOLECYSTECTOMY    . COLONOSCOPY  2011   [place]: two 3-4 mm sessile polyps, hyperplastic, sigmoid diverticula, TI appeared normal.   . ESOPHAGOGASTRODUODENOSCOPY  2011   [place]: non-bleeding erosive gastropathy, normal duodenum. path: unremarkable duodenum, negative H.pylori, minimal esophagitis   . TONSILLECTOMY  1975   Medications: Reviewed &  Updated - see associated section                       Current Outpatient Medications:  .  estradiol (VIVELLE-DOT) 0.025 MG/24HR, Place 1 patch onto the skin 2 (two) times a week., Disp: 8 patch, Rfl: 12 .  glucose blood test strip, 1 each by Other route 2 (two) times daily. Use as instructed, Disp: 100 each, Rfl: 5 .  lansoprazole (PREVACID) 15 MG capsule, Take 15 mg by mouth daily as needed. For relief , Disp: , Rfl:  .  SYNTHROID 112 MCG tablet, TAKE 1 TABLET BY MOUTH ONCE DAILY, Disp: 90 tablet, Rfl: 0 .  albuterol (PROVENTIL HFA;VENTOLIN HFA) 108 (90 Base) MCG/ACT inhaler, Inhale 2 puffs into the lungs every 4 (four) hours as needed for wheezing or shortness of breath. (Patient not taking: Reported on 10/10/2017), Disp: 1 Inhaler, Rfl: 0 .  EPINEPHrine 0.3 mg/0.3 mL IJ SOAJ injection, Inject 0.3 mLs (0.3 mg total) into the skin as needed. (Patient not taking: Reported on 10/10/2017), Disp: 1 Device, Rfl: 1 .  estradiol (VIVELLE-DOT) 0.0375 MG/24HR, APPLY 1 PATCH TOPICALLY TWICE A WEEK (Patient not taking: Reported on 02/25/2018),  Disp: 24 patch, Rfl: 12   Social History: Reviewed -  reports that she has quit smoking. She has never used smokeless tobacco.  Objective Findings:  Vitals: Blood pressure 100/60, pulse 94, height 5\' 6"  (1.676 m), weight 223 lb 9.6 oz (101.4 kg).  PHYSICAL EXAMINATION General appearance - alert, well appearing, and in no distress and oriented to person, place, and time Mental status - alert, oriented to person, place, and time, normal mood, behavior, speech, dress, motor activity, and thought processes, affect appropriate to mood  Breasts - normal to inspection  PELVIC Not done   Assessment & Plan:   A:   1.  Reduce estradiol to 0.025 2. Diagnostic mammogram 3. Breast pain and stinging 4. Patient concerned due to family history of breast cancer (Kara [name])  P:  1. Encourage pt to consider Rx Effexor  Instead of continued estrogen to see if breast  pain diminishes. 2 diagnostic mammogram. At [facility] imaging [facility].  Order placed, patient to call   By signing my name below, I, [name], attest that this documentation has been prepared under the direction and in the presence of Jonnie Kind, MD. Electronically Signed: [place]. 02/25/18. 12:29 PM.  I personally performed the services described in this documentation, which was SCRIBED in my presence. The recorded information has been reviewed and considered accurate. It has been edited as necessary during review. Jonnie Kind, MD

## 2018-03-01 ENCOUNTER — Ambulatory Visit: Payer: Medicare Other | Admitting: Family Medicine

## 2018-03-01 ENCOUNTER — Ambulatory Visit: Admission: RE | Admit: 2018-03-01 | Payer: Medicare Other | Source: Ambulatory Visit

## 2018-03-01 ENCOUNTER — Ambulatory Visit
Admission: RE | Admit: 2018-03-01 | Discharge: 2018-03-01 | Disposition: A | Discharge status: Home / Self Care | Payer: Medicare Other | Source: Ambulatory Visit | Attending: Obstetrics and Gynecology | Admitting: Obstetrics and Gynecology

## 2018-03-01 DIAGNOSIS — N644 Mastodynia: Secondary | ICD-10-CM

## 2018-03-01 DIAGNOSIS — R928 Other abnormal and inconclusive findings on diagnostic imaging of breast: Secondary | ICD-10-CM | POA: Diagnosis not present

## 2018-03-03 ENCOUNTER — Ambulatory Visit
Admission: RE | Admit: 2018-03-03 | Discharge: 2018-03-03 | Disposition: A | Discharge status: Home / Self Care | Payer: Medicare Other | Source: Ambulatory Visit | Attending: Orthopedic Surgery | Admitting: Orthopedic Surgery

## 2018-03-03 DIAGNOSIS — M25512 Pain in left shoulder: Secondary | ICD-10-CM

## 2018-03-09 DIAGNOSIS — K219 Gastro-esophageal reflux disease without esophagitis: Secondary | ICD-10-CM | POA: Diagnosis not present

## 2018-03-09 DIAGNOSIS — R079 Chest pain, unspecified: Secondary | ICD-10-CM | POA: Diagnosis not present

## 2018-03-09 DIAGNOSIS — Z6836 Body mass index (BMI) 36.0-36.9, adult: Secondary | ICD-10-CM | POA: Diagnosis not present

## 2018-03-11 ENCOUNTER — Other Ambulatory Visit: Payer: Self-pay | Admitting: Obstetrics and Gynecology

## 2018-03-11 DIAGNOSIS — Z1231 Encounter for screening mammogram for malignant neoplasm of breast: Secondary | ICD-10-CM

## 2018-03-14 DIAGNOSIS — J060 Acute laryngopharyngitis: Secondary | ICD-10-CM | POA: Diagnosis not present

## 2018-03-14 DIAGNOSIS — K219 Gastro-esophageal reflux disease without esophagitis: Secondary | ICD-10-CM | POA: Diagnosis not present

## 2018-03-22 DIAGNOSIS — H40021 Open angle with borderline findings, high risk, right eye: Secondary | ICD-10-CM | POA: Diagnosis not present

## 2018-03-22 DIAGNOSIS — H2513 Age-related nuclear cataract, bilateral: Secondary | ICD-10-CM | POA: Diagnosis not present

## 2018-03-22 DIAGNOSIS — H25013 Cortical age-related cataract, bilateral: Secondary | ICD-10-CM | POA: Diagnosis not present

## 2018-03-22 DIAGNOSIS — H401221 Low-tension glaucoma, left eye, mild stage: Secondary | ICD-10-CM | POA: Diagnosis not present

## 2018-04-09 ENCOUNTER — Encounter: Payer: Self-pay | Admitting: Gastroenterology

## 2018-04-09 ENCOUNTER — Ambulatory Visit (INDEPENDENT_AMBULATORY_CARE_PROVIDER_SITE_OTHER): Payer: Medicare Other | Admitting: Gastroenterology

## 2018-04-09 VITALS — BP 106/68 | HR 81 | Temp 97.0°F | Ht 66.0 in | Wt 227.0 lb

## 2018-04-09 DIAGNOSIS — R198 Other specified symptoms and signs involving the digestive system and abdomen: Secondary | ICD-10-CM

## 2018-04-09 DIAGNOSIS — K219 Gastro-esophageal reflux disease without esophagitis: Secondary | ICD-10-CM | POA: Diagnosis not present

## 2018-04-09 HISTORY — DX: Other specified symptoms and signs involving the digestive system and abdomen: R19.8

## 2018-04-09 NOTE — Progress Notes (Signed)
Primary Care Physician: Caren Macadam, MD  Primary Gastroenterologist:  Barney Drain, MD   Chief Complaint  Patient presents with  . Gastroesophageal Reflux    C/o extreme burning in chest/side/back.     HPI: Kara [name] is a 72 y.o. female here follow-up of GERD.  She was seen back in May 2018.  She has a history of colon polyps while living in [place].  Received records showing hyperplastic polyps in 2011, 3 to 4 mm in size 2).  Due for colonoscopy in 2021. No family history of colon cancer.  She had a EGD in 2011 with some esophagitis and gastritis, no H. pylori.  Small bowel biopsies negative for celiac.  Had been off PPI for years and doing well. 3 months ago started having issues. Burning severe in center of chest and into the back. Tried carafate didn't help. Over the counter nexium once daily for now three weeks now.  Symptoms much improved.  Previously was unbearable.  Started feeling better, last week missed  couple of doses had recurrent heartburn.  In the beginig was taking gaviscon regular to coat and then weaned off as felt better.  Denies abdominal pain, vomiting, dysphagia.  Recently has had some shift in bowel habits.  More issues with straining to have a BM.  No blood in stool or melena.  Has added prune juice and fiber which helps some.  She wonders if she is due for colonoscopy.  She believes 2011 was her only colonoscopy but she is not 100% sure.  No family history of colon cancer or colon polyps to her knowledge.  Previously advised to have a follow-up colonoscopy in 5 years however based on her pathology and current guidelines she would require 10-year follow-up colonoscopy for hyperplastic polyps.     Current Outpatient Medications  Medication Sig Dispense Refill  . Alum Hydroxide-Mag Carbonate (GAVISCON PO) Take by mouth as needed.    Marland Kitchen EPINEPHrine 0.3 mg/0.3 mL IJ SOAJ injection Inject 0.3 mLs (0.3 mg total) into the skin as needed. 1 Device 1  .  esomeprazole (NEXIUM) 20 MG capsule Take 20 mg by mouth daily.    Marland Kitchen SYNTHROID 112 MCG tablet TAKE 1 TABLET BY MOUTH ONCE DAILY 90 tablet 0   No current facility-administered medications for this visit.     Allergies as of 04/09/2018 - Review Complete 04/09/2018  Allergen Reaction Noted  . Iodine Anaphylaxis 11/09/2015  . Other Anaphylaxis 01/20/2011  . Ibuprofen Other (See Comments) 11/09/2015  . Naproxen Other (See Comments) 11/09/2015  . Penicillins Swelling 11/09/2015    ROS:  General: Negative for anorexia, weight loss, fever, chills, fatigue, weakness. ENT: Negative for hoarseness, difficulty swallowing , nasal congestion. CV: Negative for chest pain, angina, palpitations, dyspnea on exertion, peripheral edema.  Respiratory: Negative for dyspnea at rest, dyspnea on exertion, cough, sputum, wheezing.  GI: See history of present illness. GU:  Negative for dysuria, hematuria, urinary incontinence, urinary frequency, nocturnal urination.  Endo: Negative for unusual weight change.    Physical Examination:   BP 106/68   Pulse 81   Temp (!) 97 F (36.1 C) (Oral)   Ht 5\' 6"  (1.676 m)   Wt 227 lb (103 kg)   BMI 36.64 kg/m   General: Well-nourished, well-developed in no acute distress.  Eyes: No icterus. Mouth: Oropharyngeal mucosa moist and pink , no lesions erythema or exudate. Lungs: Clear to auscultation bilaterally.  Heart: Regular rate and rhythm, no murmurs rubs or gallops.  Abdomen: Bowel sounds are normal, nontender, nondistended, no hepatosplenomegaly or masses, no abdominal bruits or hernia , no rebound or guarding.   Extremities: No lower extremity edema. No clubbing or deformities. Neuro: Alert and oriented x 4   Skin: Warm and dry, no jaundice.   Psych: Alert and cooperative, normal mood and affect.

## 2018-04-09 NOTE — Progress Notes (Signed)
CC'ED TO PCP 

## 2018-04-09 NOTE — Assessment & Plan Note (Signed)
Mild change in bowel function.  She stopped estrogen recently.  Started PPI.  She is taking PPI fairly close to her Synthroid dose and this needs to be separated.  Unclear if this has any issues with her bowel function.  We discussed that based on her pathology, hyperplastic polyps, next colonoscopy for screening purposes would be in 2021.  However we discussed possibility of colonoscopy now for change in bowel habits.  She would like to monitor her stools and let me know.

## 2018-04-09 NOTE — Patient Instructions (Signed)
1. Continue Nexium OR omeprazole once daily for the next 3 months. YOU SHOULD SEPARATE FROM YOUR SYNTHROID BY 2-4 HOURS. YOU CAN TAKE NEXIUM OR OMEPRAZOLE PRIOR TO YOUR LUNCH MEAL. After 3 months, you can try to wean off Nexium over 2-3 weeks.  2. Call if you decide you want to pursue colonoscopy for change in bowel habits.  3. Call if you have any questions or concerns. Return to office as needed.    Food Choices for Gastroesophageal Reflux Disease, Adult When you have gastroesophageal reflux disease (GERD), the foods you eat and your eating habits are very important. Choosing the right foods can help ease the discomfort of GERD. Consider working with a diet and nutrition specialist (dietitian) to help you make healthy food choices. What general guidelines should I follow? Eating plan  Choose healthy foods low in fat, such as fruits, vegetables, whole grains, low-fat dairy products, and lean meat, fish, and poultry.  Eat frequent, small meals instead of three large meals each day. Eat your meals slowly, in a relaxed setting. Avoid bending over or lying down until 2-3 hours after eating.  Limit high-fat foods such as fatty meats or fried foods.  Limit your intake of oils, butter, and shortening to less than 8 teaspoons each day.  Avoid the following: ? Foods that cause symptoms. These may be different for different people. Keep a food diary to keep track of foods that cause symptoms. ? Alcohol. ? Drinking large amounts of liquid with meals. ? Eating meals during the 2-3 hours before bed.  Cook foods using methods other than frying. This may include baking, grilling, or broiling. Lifestyle   Maintain a healthy weight. Ask your health care provider what weight is healthy for you. If you need to lose weight, work with your health care provider to do so safely.  Exercise for at least 30 minutes on 5 or more days each week, or as told by your health care provider.  Avoid wearing clothes  that fit tightly around your waist and chest.  Do not use any products that contain nicotine or tobacco, such as cigarettes and e-cigarettes. If you need help quitting, ask your health care provider.  Sleep with the head of your bed raised. Use a wedge under the mattress or blocks under the bed frame to raise the head of the bed. What foods are not recommended? The items listed may not be a complete list. Talk with your dietitian about what dietary choices are best for you. Grains Pastries or quick breads with added fat. [place] toast. Vegetables Deep fried vegetables. [place] fries. Any vegetables prepared with added fat. Any vegetables that cause symptoms. For some people this may include tomatoes and tomato products, chili peppers, onions and garlic, and horseradish. Fruits Any fruits prepared with added fat. Any fruits that cause symptoms. For some people this may include citrus fruits, such as oranges, grapefruit, pineapple, and lemons. Meats and other protein foods High-fat meats, such as fatty beef or pork, hot dogs, ribs, ham, sausage, salami and bacon. Fried meat or protein, including fried fish and fried chicken. Nuts and nut butters. Dairy Whole milk and chocolate milk. Sour cream. Cream. Ice cream. Cream cheese. Milk shakes. Beverages Coffee and tea, with or without caffeine. Carbonated beverages. Sodas. Energy drinks. Fruit juice made with acidic fruits (such as orange or grapefruit). Tomato juice. Alcoholic drinks. Fats and oils Butter. Margarine. Shortening. Ghee. Sweets and desserts Chocolate and cocoa. Donuts. Seasoning and other foods Pepper. Peppermint and  spearmint. Any condiments, herbs, or seasonings that cause symptoms. For some people, this may include curry, hot sauce, or vinegar-based salad dressings. Summary  When you have gastroesophageal reflux disease (GERD), food and lifestyle choices are very important to help ease the discomfort of GERD.  Eat frequent,  small meals instead of three large meals each day. Eat your meals slowly, in a relaxed setting. Avoid bending over or lying down until 2-3 hours after eating.  Limit high-fat foods such as fatty meat or fried foods. This information is not intended to replace advice given to you by your health care provider. Make sure you discuss any questions you have with your health care provider. Document Released: 05/01/2005 Document Revised: 05/02/2016 Document Reviewed: 05/02/2016 Elsevier Interactive Patient Education  2018 [place].   Gastroesophageal Reflux Disease, Adult Normally, food travels down the esophagus and stays in the stomach to be digested. However, when a person has gastroesophageal reflux disease (GERD), food and stomach acid move back up into the esophagus. When this happens, the esophagus becomes sore and inflamed. Over time, GERD can create small holes (ulcers) in the lining of the esophagus. What are the causes? This condition is caused by a problem with the muscle between the esophagus and the stomach (lower esophageal sphincter, or LES). Normally, the LES muscle closes after food passes through the esophagus to the stomach. When the LES is weakened or abnormal, it does not close properly, and that allows food and stomach acid to go back up into the esophagus. The LES can be weakened by certain dietary substances, medicines, and medical conditions, including:  Tobacco use.  Pregnancy.  Having a hiatal hernia.  Heavy alcohol use.  Certain foods and beverages, such as coffee, chocolate, onions, and peppermint.  What increases the risk? This condition is more likely to develop in:  People who have an increased body weight.  People who have connective tissue disorders.  People who use NSAID medicines.  What are the signs or symptoms? Symptoms of this condition include:  Heartburn.  Difficult or painful swallowing.  The feeling of having a lump in the  throat.  Abitter taste in the mouth.  Bad breath.  Having a large amount of saliva.  Having an upset or bloated stomach.  Belching.  Chest pain.  Shortness of breath or wheezing.  Ongoing (chronic) cough or a night-time cough.  Wearing away of tooth enamel.  Weight loss.  Different conditions can cause chest pain. Make sure to see your health care provider if you experience chest pain. How is this diagnosed? Your health care provider will take a medical history and perform a physical exam. To determine if you have mild or severe GERD, your health care provider may also monitor how you respond to treatment. You may also have other tests, including:  An endoscopy toexamine your stomach and esophagus with a small camera.  A test thatmeasures the acidity level in your esophagus.  A test thatmeasures how much pressure is on your esophagus.  A barium swallow or modified barium swallow to show the shape, size, and functioning of your esophagus.  How is this treated? The goal of treatment is to help relieve your symptoms and to prevent complications. Treatment for this condition may vary depending on how severe your symptoms are. Your health care provider may recommend:  Changes to your diet.  Medicine.  Surgery.  Follow these instructions at home: Diet  Follow a diet as recommended by your health care provider. This may  involve avoiding foods and drinks such as: ? Coffee and tea (with or without caffeine). ? Drinks that containalcohol. ? Energy drinks and sports drinks. ? Carbonated drinks or sodas. ? Chocolate and cocoa. ? Peppermint and mint flavorings. ? Garlic and onions. ? Horseradish. ? Spicy and acidic foods, including peppers, chili powder, curry powder, vinegar, hot sauces, and barbecue sauce. ? Citrus fruit juices and citrus fruits, such as oranges, lemons, and limes. ? Tomato-based foods, such as red sauce, chili, salsa, and pizza with red  sauce. ? Fried and fatty foods, such as donuts, french fries, potato chips, and high-fat dressings. ? High-fat meats, such as hot dogs and fatty cuts of red and white meats, such as rib eye steak, sausage, ham, and bacon. ? High-fat dairy items, such as whole milk, butter, and cream cheese.  Eat small, frequent meals instead of large meals.  Avoid drinking large amounts of liquid with your meals.  Avoid eating meals during the 2-3 hours before bedtime.  Avoid lying down right after you eat.  Do not exercise right after you eat. General instructions  Pay attention to any changes in your symptoms.  Take over-the-counter and prescription medicines only as told by your health care provider. Do not take aspirin, ibuprofen, or other NSAIDs unless your health care provider told you to do so.  Do not use any tobacco products, including cigarettes, chewing tobacco, and e-cigarettes. If you need help quitting, ask your health care provider.  Wear loose-fitting clothing. Do not wear anything tight around your waist that causes pressure on your abdomen.  Raise (elevate) the head of your bed 6 inches (15cm).  Try to reduce your stress, such as with yoga or meditation. If you need help reducing stress, ask your health care provider.  If you are overweight, reduce your weight to an amount that is healthy for you. Ask your health care provider for guidance about a safe weight loss goal.  Keep all follow-up visits as told by your health care provider. This is important. Contact a health care provider if:  You have new symptoms.  You have unexplained weight loss.  You have difficulty swallowing, or it hurts to swallow.  You have wheezing or a persistent cough.  Your symptoms do not improve with treatment.  You have a hoarse voice. Get help right away if:  You have pain in your arms, neck, jaw, teeth, or back.  You feel sweaty, dizzy, or light-headed.  You have chest pain or shortness  of breath.  You vomit and your vomit looks like blood or coffee grounds.  You faint.  Your stool is bloody or black.  You cannot swallow, drink, or eat. This information is not intended to replace advice given to you by your health care provider. Make sure you discuss any questions you have with your health care provider. Document Released: 02/08/2005 Document Revised: 09/29/2015 Document Reviewed: 08/26/2014 Elsevier Interactive Patient Education  [employer].

## 2018-04-09 NOTE — Assessment & Plan Note (Signed)
Recent flare, no alarm symptoms.  Doing much better back on Nexium 20 mg daily.  She can take either Nexium or omeprazole (either works similarly for her and she had some at home) once daily on empty stomach followed by a meal 30 to 60 minutes later.  Would advise her to separate from her Synthroid at least 2 hours, preferably 4.  She will taking PPI for at least 3 months, they can try to wean off over a couple week period of time, dropping back to every other day and then every second day, etc IF tolerated.  Discussed antireflux measures, diet.  Patient declined return office visit, she will call if needed.

## 2018-04-23 ENCOUNTER — Ambulatory Visit
Admission: RE | Admit: 2018-04-23 | Discharge: 2018-04-23 | Disposition: A | Discharge status: Home / Self Care | Payer: Medicare Other | Source: Ambulatory Visit | Attending: Obstetrics and Gynecology | Admitting: Obstetrics and Gynecology

## 2018-04-23 DIAGNOSIS — Z1231 Encounter for screening mammogram for malignant neoplasm of breast: Secondary | ICD-10-CM | POA: Diagnosis not present

## 2018-05-27 DIAGNOSIS — J019 Acute sinusitis, unspecified: Secondary | ICD-10-CM | POA: Diagnosis not present

## 2018-05-27 DIAGNOSIS — H1089 Other conjunctivitis: Secondary | ICD-10-CM | POA: Diagnosis not present

## 2018-05-29 ENCOUNTER — Ambulatory Visit: Payer: Medicare Other | Admitting: Obstetrics and Gynecology

## 2018-06-05 ENCOUNTER — Ambulatory Visit: Payer: Medicare Other | Admitting: Obstetrics and Gynecology

## 2018-06-10 ENCOUNTER — Ambulatory Visit: Payer: Medicare Other | Admitting: Gastroenterology

## 2018-06-10 ENCOUNTER — Encounter

## 2018-06-14 ENCOUNTER — Ambulatory Visit: Payer: Medicare Other | Admitting: Obstetrics and Gynecology

## 2018-06-20 ENCOUNTER — Encounter: Payer: Self-pay | Admitting: *Deleted

## 2018-06-20 ENCOUNTER — Ambulatory Visit: Payer: Medicare Other | Admitting: Obstetrics and Gynecology

## 2018-06-25 ENCOUNTER — Encounter: Payer: Self-pay | Admitting: Obstetrics and Gynecology

## 2018-06-25 ENCOUNTER — Ambulatory Visit (INDEPENDENT_AMBULATORY_CARE_PROVIDER_SITE_OTHER): Payer: Medicare Other | Admitting: Obstetrics and Gynecology

## 2018-06-25 VITALS — BP 91/62 | HR 85 | Ht 66.0 in | Wt 226.4 lb

## 2018-06-25 DIAGNOSIS — N952 Postmenopausal atrophic vaginitis: Secondary | ICD-10-CM

## 2018-06-25 DIAGNOSIS — N951 Menopausal and female climacteric states: Secondary | ICD-10-CM

## 2018-06-25 NOTE — Progress Notes (Signed)
   [facility] Visit  @DATE @            Patient name: Kara [name] MRN [id number]  Date of birth: 01-27-1946  CC & HPI:  Kara [name] is a 73 y.o. female presenting today for discussion of HT options for insomnia. Pt has used patches x yrs, with good effects, including reduced insomnia. We tried oral HT on 0.375 which was tolerated, but 0.25 made her feel worse(?). She stopped all HT and had increased insomnia and overall fatigue.    ROS:  ROS   Pertinent History Reviewed:   Reviewed: Significant for s/ p hyst, not sexually active, no hx dvt or ca breast. Medical         Past Medical History:  Diagnosis Date  . Allergy    food allergies, drug allergies  . Anemia   . Anxiety   . Cataract   . Diabetes mellitus without complication (HCC)    no medication   . GERD (gastroesophageal reflux disease)   . Hyperlipidemia   . Thyroid disease                               Surgical Hx:    Past Surgical History:  Procedure Laterality Date  . ABDOMINAL HYSTERECTOMY     bleeding. fibroids  . CESAREAN SECTION    . CHOLECYSTECTOMY    . COLONOSCOPY  2011   [place]: two 3-4 mm sessile polyps, hyperplastic, sigmoid diverticula, TI appeared normal.   . ESOPHAGOGASTRODUODENOSCOPY  2011   [place]: non-bleeding erosive gastropathy, normal duodenum. path: unremarkable duodenum, negative H.pylori, minimal esophagitis   . TONSILLECTOMY  1975   Medications: Reviewed & Updated - see associated section                       Current Outpatient Medications:  .  Alum Hydroxide-Mag Carbonate (GAVISCON PO), Take by mouth as needed., Disp: , Rfl:  .  EPINEPHrine 0.3 mg/0.3 mL IJ SOAJ injection, Inject 0.3 mLs (0.3 mg total) into the skin as needed., Disp: 1 Device, Rfl: 1 .  SYNTHROID 112 MCG tablet, TAKE 1 TABLET BY MOUTH ONCE DAILY, Disp: 90 tablet, Rfl: 0   Social History: Reviewed -  reports that she has quit smoking. She has never used smokeless tobacco.  Objective Findings:   Vitals: Blood pressure 91/62, pulse 85, height 5\' 6"  (1.676 m), weight 226 lb 6.4 oz (102.7 kg).  PHYSICAL EXAMINATION General appearance - alert, well appearing, and in no distress, oriented to person, place, and time and overweight Mental status - alert, oriented to person, place, and time, affect appropriate to mood   Assessment & Plan:   A:  1.  insomnia,  2. 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 3.   P:  1.  will try tylenol PM first. 2. If not improved pt will go back on estrogen 0.375, and have pharmacy notify us of refil need.

## 2018-06-25 NOTE — Patient Instructions (Signed)
Try tylenol PM first, before restarting hormone patch tx. Stop Hormone therapy if any immobility develops.

## 2018-07-02 DIAGNOSIS — M5481 Occipital neuralgia: Secondary | ICD-10-CM | POA: Diagnosis not present

## 2018-07-22 DIAGNOSIS — R7301 Impaired fasting glucose: Secondary | ICD-10-CM | POA: Diagnosis not present

## 2018-07-22 DIAGNOSIS — E039 Hypothyroidism, unspecified: Secondary | ICD-10-CM | POA: Diagnosis not present

## 2018-07-26 ENCOUNTER — Other Ambulatory Visit (HOSPITAL_BASED_OUTPATIENT_CLINIC_OR_DEPARTMENT_OTHER): Payer: Self-pay

## 2018-07-26 DIAGNOSIS — K219 Gastro-esophageal reflux disease without esophagitis: Secondary | ICD-10-CM | POA: Diagnosis not present

## 2018-07-26 DIAGNOSIS — E039 Hypothyroidism, unspecified: Secondary | ICD-10-CM | POA: Diagnosis not present

## 2018-07-26 DIAGNOSIS — R300 Dysuria: Secondary | ICD-10-CM | POA: Diagnosis not present

## 2018-07-26 DIAGNOSIS — G4730 Sleep apnea, unspecified: Secondary | ICD-10-CM

## 2018-07-26 DIAGNOSIS — Z0001 Encounter for general adult medical examination with abnormal findings: Secondary | ICD-10-CM | POA: Diagnosis not present

## 2018-07-26 DIAGNOSIS — G4700 Insomnia, unspecified: Secondary | ICD-10-CM | POA: Diagnosis not present

## 2018-07-26 DIAGNOSIS — R5383 Other fatigue: Secondary | ICD-10-CM | POA: Diagnosis not present

## 2018-07-26 DIAGNOSIS — R82998 Other abnormal findings in urine: Secondary | ICD-10-CM | POA: Diagnosis not present

## 2018-07-26 DIAGNOSIS — R7301 Impaired fasting glucose: Secondary | ICD-10-CM | POA: Diagnosis not present

## 2018-07-26 DIAGNOSIS — Z6836 Body mass index (BMI) 36.0-36.9, adult: Secondary | ICD-10-CM | POA: Diagnosis not present

## 2018-07-26 DIAGNOSIS — M25512 Pain in left shoulder: Secondary | ICD-10-CM | POA: Diagnosis not present

## 2018-07-26 DIAGNOSIS — R079 Chest pain, unspecified: Secondary | ICD-10-CM | POA: Diagnosis not present

## 2018-07-26 DIAGNOSIS — J060 Acute laryngopharyngitis: Secondary | ICD-10-CM | POA: Diagnosis not present

## 2018-07-26 DIAGNOSIS — E782 Mixed hyperlipidemia: Secondary | ICD-10-CM | POA: Diagnosis not present

## 2018-07-26 DIAGNOSIS — R002 Palpitations: Secondary | ICD-10-CM | POA: Diagnosis not present

## 2018-07-26 DIAGNOSIS — R0683 Snoring: Secondary | ICD-10-CM | POA: Diagnosis not present

## 2018-10-18 DIAGNOSIS — H40021 Open angle with borderline findings, high risk, right eye: Secondary | ICD-10-CM | POA: Diagnosis not present

## 2018-10-18 DIAGNOSIS — H401221 Low-tension glaucoma, left eye, mild stage: Secondary | ICD-10-CM | POA: Diagnosis not present

## 2018-10-18 DIAGNOSIS — H0261 Xanthelasma of right upper eyelid: Secondary | ICD-10-CM | POA: Diagnosis not present

## 2018-10-18 DIAGNOSIS — H04123 Dry eye syndrome of bilateral lacrimal glands: Secondary | ICD-10-CM | POA: Diagnosis not present

## 2018-10-25 ENCOUNTER — Other Ambulatory Visit: Payer: Self-pay

## 2018-10-25 ENCOUNTER — Other Ambulatory Visit: Payer: Medicare Other

## 2018-10-25 DIAGNOSIS — R6889 Other general symptoms and signs: Secondary | ICD-10-CM | POA: Diagnosis not present

## 2018-10-25 DIAGNOSIS — Z20822 Contact with and (suspected) exposure to covid-19: Secondary | ICD-10-CM

## 2018-10-31 LAB — NOVEL CORONAVIRUS, NAA: SARS-CoV-2, NAA: NOT DETECTED

## 2018-11-05 DIAGNOSIS — K219 Gastro-esophageal reflux disease without esophagitis: Secondary | ICD-10-CM | POA: Diagnosis not present

## 2018-11-05 DIAGNOSIS — J060 Acute laryngopharyngitis: Secondary | ICD-10-CM | POA: Diagnosis not present

## 2018-11-05 DIAGNOSIS — R5383 Other fatigue: Secondary | ICD-10-CM | POA: Diagnosis not present

## 2018-11-05 DIAGNOSIS — R7301 Impaired fasting glucose: Secondary | ICD-10-CM | POA: Diagnosis not present

## 2018-11-05 DIAGNOSIS — R079 Chest pain, unspecified: Secondary | ICD-10-CM | POA: Diagnosis not present

## 2018-11-05 DIAGNOSIS — R3915 Urgency of urination: Secondary | ICD-10-CM | POA: Diagnosis not present

## 2018-11-05 DIAGNOSIS — E039 Hypothyroidism, unspecified: Secondary | ICD-10-CM | POA: Diagnosis not present

## 2018-11-05 DIAGNOSIS — R319 Hematuria, unspecified: Secondary | ICD-10-CM | POA: Diagnosis not present

## 2018-11-05 DIAGNOSIS — R0683 Snoring: Secondary | ICD-10-CM | POA: Diagnosis not present

## 2018-11-05 DIAGNOSIS — E785 Hyperlipidemia, unspecified: Secondary | ICD-10-CM | POA: Diagnosis not present

## 2018-11-05 DIAGNOSIS — R300 Dysuria: Secondary | ICD-10-CM | POA: Diagnosis not present

## 2018-11-05 DIAGNOSIS — G4700 Insomnia, unspecified: Secondary | ICD-10-CM | POA: Diagnosis not present

## 2018-11-05 DIAGNOSIS — Z6836 Body mass index (BMI) 36.0-36.9, adult: Secondary | ICD-10-CM | POA: Diagnosis not present

## 2018-11-05 DIAGNOSIS — R002 Palpitations: Secondary | ICD-10-CM | POA: Diagnosis not present

## 2018-11-14 ENCOUNTER — Encounter: Payer: Self-pay | Admitting: Orthopaedic Surgery

## 2018-11-14 ENCOUNTER — Other Ambulatory Visit: Payer: Self-pay

## 2018-11-14 ENCOUNTER — Ambulatory Visit (INDEPENDENT_AMBULATORY_CARE_PROVIDER_SITE_OTHER): Payer: Medicare Other | Admitting: Orthopaedic Surgery

## 2018-11-14 VITALS — BP 109/67 | HR 76 | Ht 66.0 in | Wt 209.2 lb

## 2018-11-14 DIAGNOSIS — M79604 Pain in right leg: Secondary | ICD-10-CM

## 2018-11-14 MED ORDER — PREDNISONE 5 MG (21) PO TBPK: ORAL_TABLET | ORAL | 0 refills | Status: DC

## 2018-11-14 NOTE — Progress Notes (Signed)
Patient Kara [name], female DOB:[birth year], 72 y.o. LTJ:[id number]  Chief Complaint  Patient presents with  . Knee Pain    R/dull sore pain/feels swollen inside  . Leg Pain    R/pain radiates up to knee    HPI  Kara [name] is a 73 y.o. female who has developed pain in the right leg and to the foot. She has swelling chronically of both feet. She has no trauma, no redness.  She cannot take NSAIDs.  The pain has been present about four to six weeks. She has no insect bites.  The pain is diffuse. She has no pain of the knee or the hip or back.   Body mass index is 33.77 kg/m.  ROS  Review of Systems  Constitutional: Positive for activity change.  Musculoskeletal: Positive for arthralgias and gait problem.  All other systems reviewed and are negative.   All other systems reviewed and are negative.  The following is a summary of the past history medically, past history surgically, known current medicines, social history and family history.  This information is gathered electronically by the computer from prior information and documentation.  I review this each visit and have found including this information at this point in the chart is beneficial and informative.    Past Medical History:  Diagnosis Date  . Allergy    food allergies, drug allergies  . Anemia   . Anxiety   . Cataract   . Diabetes mellitus without complication (HCC)    no medication   . GERD (gastroesophageal reflux disease)   . Hyperlipidemia   . Thyroid disease     Past Surgical History:  Procedure Laterality Date  . ABDOMINAL HYSTERECTOMY     bleeding. fibroids  . CESAREAN SECTION    . CHOLECYSTECTOMY    . COLONOSCOPY  2011   [place]: two 3-4 mm sessile polyps, hyperplastic, sigmoid diverticula, TI appeared normal.   . ESOPHAGOGASTRODUODENOSCOPY  2011   [place]: non-bleeding erosive gastropathy, normal duodenum. path: unremarkable duodenum, negative H.pylori, minimal esophagitis   .  TONSILLECTOMY  1975    Family History  Problem Relation Age of Onset  . Cancer Maternal Grandmother        breast  . Breast cancer Maternal Grandmother   . Cancer Maternal Grandfather   . Cancer Paternal Grandmother   . Cancer Paternal Grandfather   . Breast cancer Paternal Grandfather   . Arthritis Mother   . Cancer Mother        breast  . COPD Mother   . Breast cancer Mother   . Cancer Father        throat  . Stroke Father   . Cancer Maternal Aunt [address] [place], cervical cancer  . Breast cancer Maternal Aunt   . Breast cancer Paternal Aunt   . Colon cancer Neg Hx     Social History Social History   Tobacco Use  . Smoking status: Former Research scientist (life sciences)  . Smokeless tobacco: Never Used  . Tobacco comment: quit 2002  Substance Use Topics  . Alcohol use: No  . Drug use: No    Allergies  Allergen Reactions  . Iodine Anaphylaxis  . Other Anaphylaxis    Allergic to seafood and nuts  . Ibuprofen Other (See Comments)    Uncertain - headache  . Naproxen Other (See Comments)    headache  . Penicillins Swelling    Current Outpatient Medications  Medication Sig Dispense Refill  . estradiol (VIVELLE-DOT)  0.0375 MG/24HR Place 1 patch onto the skin 2 (two) times a week.    . Alum Hydroxide-Mag Carbonate (GAVISCON PO) Take by mouth as needed.    Marland Kitchen EPINEPHrine 0.3 mg/0.3 mL IJ SOAJ injection Inject 0.3 mLs (0.3 mg total) into the skin as needed. 1 Device 1  . predniSONE (STERAPRED UNI-PAK 21 TAB) 5 MG (21) TBPK tablet Take 6 pills first day; 5 pills second day; 4 pills third day; 3 pills fourth day; 2 pills next day and 1 pill last day. 21 tablet 0  . SYNTHROID 112 MCG tablet TAKE 1 TABLET BY MOUTH ONCE DAILY 90 tablet 0   No current facility-administered medications for this visit.      Physical Exam  Blood pressure 109/67, pulse 76, height 5\' 6"  (1.676 m), weight 209 lb 4 oz (94.9 kg).  Constitutional: overall normal hygiene, normal nutrition, well developed,  normal grooming, normal body habitus. Assistive device:none  Musculoskeletal: gait and station Limp right, muscle tone and strength are normal, no tremors or atrophy is present.  .  Neurological: coordination overall normal.  Deep tendon reflex/nerve stretch intact.  Sensation normal.  Cranial nerves II-XII intact.   Skin:   Normal overall no scars, lesions, ulcers or rashes. No psoriasis.  Psychiatric: Alert and oriented x 3.  Recent memory intact, remote memory unclear.  Normal mood and affect. Well groomed.  Good eye contact.  Cardiovascular  She has swelling of both feet and ankles, chronic.  normal temperatures of the legs and arms, no clubbing, cyanosis and good capillary refill.  Lymphatic: palpation is normal.  She has diffuse tenderness of the right lower leg and foot. No redness.  NV is intact.  ROM of ankle is full.  She has bilateral edema.  Limp to the right.  All other systems reviewed and are negative   The patient has been educated about the nature of the problem(s) and counseled on treatment options.  The patient appeared to understand what I have discussed and is in agreement with it.  Encounter Diagnosis  Name Primary?  . Pain in right leg Yes   She has inflammation of the lower extremity and ankle.  She cannot take NSAIDs so I will begin prednisone. I went over precautions with her.  I do not know the cause of the problem.  PLAN Call if any problems.  Precautions discussed.  Continue current medications.   Return to clinic 2 weeks   Electronically Signed Sanjuana Kava, MD 7/2/20209:10 AM

## 2018-11-17 ENCOUNTER — Emergency Department (HOSPITAL_COMMUNITY)
Admission: EM | Admit: 2018-11-17 | Discharge: 2018-11-18 | Disposition: A | Discharge status: Home / Self Care | Payer: Medicare Other | Attending: Emergency Medicine | Admitting: Emergency Medicine

## 2018-11-17 ENCOUNTER — Other Ambulatory Visit: Payer: Self-pay

## 2018-11-17 ENCOUNTER — Encounter (HOSPITAL_COMMUNITY): Payer: Self-pay | Admitting: Emergency Medicine

## 2018-11-17 DIAGNOSIS — E119 Type 2 diabetes mellitus without complications: Secondary | ICD-10-CM | POA: Insufficient documentation

## 2018-11-17 DIAGNOSIS — Y999 Unspecified external cause status: Secondary | ICD-10-CM | POA: Insufficient documentation

## 2018-11-17 DIAGNOSIS — E039 Hypothyroidism, unspecified: Secondary | ICD-10-CM | POA: Diagnosis not present

## 2018-11-17 DIAGNOSIS — Z87891 Personal history of nicotine dependence: Secondary | ICD-10-CM | POA: Diagnosis not present

## 2018-11-17 DIAGNOSIS — T17298A Other foreign object in pharynx causing other injury, initial encounter: Secondary | ICD-10-CM | POA: Diagnosis not present

## 2018-11-17 DIAGNOSIS — T17208A Unspecified foreign body in pharynx causing other injury, initial encounter: Secondary | ICD-10-CM

## 2018-11-17 DIAGNOSIS — Y9389 Activity, other specified: Secondary | ICD-10-CM | POA: Diagnosis not present

## 2018-11-17 DIAGNOSIS — X58XXXA Exposure to other specified factors, initial encounter: Secondary | ICD-10-CM | POA: Diagnosis not present

## 2018-11-17 DIAGNOSIS — Y92008 Other place in unspecified non-institutional (private) residence as the place of occurrence of the external cause: Secondary | ICD-10-CM | POA: Diagnosis not present

## 2018-11-17 DIAGNOSIS — Z79899 Other long term (current) drug therapy: Secondary | ICD-10-CM | POA: Insufficient documentation

## 2018-11-17 DIAGNOSIS — K089 Disorder of teeth and supporting structures, unspecified: Secondary | ICD-10-CM | POA: Diagnosis not present

## 2018-11-17 NOTE — ED Triage Notes (Signed)
Patient was flossing her teeth with using a threader and the patient states the threader may be stuck in her throat. [facility] nurse line advised her to be evaluated.

## 2018-11-18 ENCOUNTER — Emergency Department (HOSPITAL_COMMUNITY): Payer: Medicare Other

## 2018-11-18 DIAGNOSIS — K089 Disorder of teeth and supporting structures, unspecified: Secondary | ICD-10-CM | POA: Diagnosis not present

## 2018-11-18 DIAGNOSIS — T17298A Other foreign object in pharynx causing other injury, initial encounter: Secondary | ICD-10-CM | POA: Diagnosis not present

## 2018-11-18 NOTE — Discharge Instructions (Addendum)
You can eat and drink normally.  If you continue to have a feeling that there is something stuck on your tongue, call your gastroenterology office to see if they can see you, they may want you to see a ENT specialist however if you continue to feel like it is stuck up high in your throat.  Return to the emergency department if you have trouble breathing or swallowing.

## 2018-11-18 NOTE — ED Provider Notes (Signed)
[facility] EMERGENCY DEPARTMENT Provider Note   CSN: [id number] Arrival date & time: 11/17/18  2151  Time seen 12:25 AM  History   Chief Complaint Chief Complaint  Patient presents with  . object in throat    HPI Kara [name] is a 73 y.o. female.     HPI patient states about 9 PM she was flossing her teeth and she has a "threader" that she uses to floss around her bridge.  She states she was able to find the dental floss but she lost the "threader".  She states she feels like there is something on the back of her tongue.  She states she tried to get it with a toothbrush without success.  She states she feels like there is still something stuck on the back of her tongue.  She denies choking, having any difficulty swallowing any difficulty breathing.  PCP Celene Squibb, MD GI Dr Oneida Alar  Past Medical History:  Diagnosis Date  . Allergy    food allergies, drug allergies  . Anemia   . Anxiety   . Cataract   . Diabetes mellitus without complication (HCC)    no medication   . GERD (gastroesophageal reflux disease)   . Hyperlipidemia   . Thyroid disease     Patient Active Problem List   Diagnosis Date Noted  . Change in bowel function 04/09/2018  . Breast pain, left 02/25/2018  . Prediabetes 08/30/2017  . Dyspepsia 09/29/2016  . Colon polyps 08/23/2016  . Family history of cancer 08/23/2016  . HLD (hyperlipidemia) 08/23/2016  . Obesity, Class II, BMI 35-39.9, with comorbidity 08/23/2016  . GERD (gastroesophageal reflux disease) 08/14/2016  . Hypothyroid 08/14/2016  . Perimenopausal vasomotor symptoms 02/11/2016  . Postmenopausal atrophic vaginitis 11/09/2015    Past Surgical History:  Procedure Laterality Date  . ABDOMINAL HYSTERECTOMY     bleeding. fibroids  . CESAREAN SECTION    . CHOLECYSTECTOMY    . COLONOSCOPY  2011   [place]: two 3-4 mm sessile polyps, hyperplastic, sigmoid diverticula, TI appeared normal.   . ESOPHAGOGASTRODUODENOSCOPY  2011   [place]: non-bleeding erosive gastropathy, normal duodenum. path: unremarkable duodenum, negative H.pylori, minimal esophagitis   . TONSILLECTOMY  1975     OB History    Gravida  3   Para  2   Term      Preterm      AB  1   Living        SAB  1   TAB      Ectopic      Multiple      Live Births               Home Medications    Prior to Admission medications   Medication Sig Start Date End Date Taking? Authorizing Provider  Alum Hydroxide-Mag Carbonate (GAVISCON PO) Take by mouth as needed.    [provider]  EPINEPHrine 0.3 mg/0.3 mL IJ SOAJ injection Inject 0.3 mLs (0.3 mg total) into the skin as needed. 11/28/16   Raylene Everts, MD  estradiol (VIVELLE-DOT) 0.0375 MG/24HR Place 1 patch onto the skin 2 (two) times a week.    [provider]  predniSONE (STERAPRED UNI-PAK 21 TAB) 5 MG (21) TBPK tablet Take 6 pills first day; 5 pills second day; 4 pills third day; 3 pills fourth day; 2 pills next day and 1 pill last day. 11/14/18   Sanjuana Kava, MD  SYNTHROID 112 MCG tablet TAKE 1 TABLET BY MOUTH ONCE DAILY  10/16/17   Caren Macadam, MD    Family History Family History  Problem Relation Age of Onset  . Cancer Maternal Grandmother        breast  . Breast cancer Maternal Grandmother   . Cancer Maternal Grandfather   . Cancer Paternal Grandmother   . Cancer Paternal Grandfather   . Breast cancer Paternal Grandfather   . Arthritis Mother   . Cancer Mother        breast  . COPD Mother   . Breast cancer Mother   . Cancer Father        throat  . Stroke Father   . Cancer Maternal Aunt [address] Lacks, cervical cancer  . Breast cancer Maternal Aunt   . Breast cancer Paternal Aunt   . Colon cancer Neg Hx     Social History Social History   Tobacco Use  . Smoking status: Former Research scientist (life sciences)  . Smokeless tobacco: Never Used  . Tobacco comment: quit 2002  Substance Use Topics  . Alcohol use: No  . Drug use: No     Allergies    Iodine, Other, Ibuprofen, Naproxen, and Penicillins   Review of Systems Review of Systems  All other systems reviewed and are negative.    Physical Exam Updated Vital Signs BP 105/72 (BP Location: Right Arm)   Pulse 96   Temp (!) 97.5 F (36.4 C) (Oral)   Resp 18   Ht 5\' 6"  (1.676 m)   Wt 93.9 kg   SpO2 100%   BMI 33.41 kg/m   Vital signs normal    Physical Exam Vitals signs and nursing note reviewed.  Constitutional:      General: She is not in acute distress.    Appearance: Normal appearance.  HENT:     Head: Normocephalic and atraumatic.     Right Ear: External ear normal.     Left Ear: External ear normal.     Nose: Nose normal.     Mouth/Throat:     Mouth: Mucous membranes are moist.     Pharynx: No oropharyngeal exudate or posterior oropharyngeal erythema.     Comments: Patient was very cooperative and I was able to get a very good look at her oropharynx.  I do not see any obvious foreign body, patient had a sample of the threader for me to look at so I knew what it would visually look like.  She states she feels like it is up in her left upper throat.  She is not having any difficulty swallowing or breathing. Eyes:     Extraocular Movements: Extraocular movements intact.     Conjunctiva/sclera: Conjunctivae normal.     Pupils: Pupils are equal, round, and reactive to light.  Neurological:     Mental Status: She is alert.      ED Treatments / Results  Labs (all labs ordered are listed, but only abnormal results are displayed) Labs Reviewed - No data to display  EKG None  Radiology Dg Neck Soft Tissue  Result Date: 11/18/2018 CLINICAL DATA:  Possible foreign body in throat, patient reports flossing teeth with a threader indicate may be stuck in throat. EXAM: NECK SOFT TISSUES - 1+ VIEW COMPARISON:  Radiographs 01/20/2011 FINDINGS: No radiopaque foreign body. Density projecting over the cricoid cartilage was seen on prior exam. No suspicious soft  tissue air. There is no evidence of retropharyngeal soft tissue swelling or epiglottic enlargement. The cervical airway is unremarkable. Carotid calcifications.  Degenerative change in the spine. IMPRESSION: No radiopaque foreign body. Electronically Signed   By: Keith Rake M.D.   On: 11/18/2018 00:40    Procedures Procedures (including critical care time)  Medications Ordered in ED Medications - No data to display   Initial Impression / Assessment and Plan / ED Course  I have reviewed the triage vital signs and the nursing notes.  Pertinent labs & imaging results that were available during my care of the patient were reviewed by me and considered in my medical decision making (see chart for details).    Patient was reassured that this foreign body should pass without causing any blockage or obstruction.  She asked about eating and drinking and I told her that would be fine and it might even help it pass.  She was advised to follow-up tomorrow if she still feels like she has the foreign body sensation, hopefully she just had a superficial irritation on the back of her tongue where she tried to retrieve the foreign body with a toothbrush by herself.   Final Clinical Impressions(s) / ED Diagnoses   Final diagnoses:  Foreign body in pharynx, initial encounter    ED Discharge Orders    None     Plan discharge  Rolland Porter, MD, Barbette Or, MD 11/18/18 [phone]

## 2018-11-18 NOTE — ED Notes (Signed)
Pt left without paperwork, VS, or signing d/c paperwork.

## 2018-11-20 ENCOUNTER — Ambulatory Visit (INDEPENDENT_AMBULATORY_CARE_PROVIDER_SITE_OTHER): Payer: Medicare Other | Admitting: Gastroenterology

## 2018-11-20 ENCOUNTER — Encounter: Payer: Self-pay | Admitting: Gastroenterology

## 2018-11-20 ENCOUNTER — Other Ambulatory Visit: Payer: Self-pay

## 2018-11-20 DIAGNOSIS — Z87821 Personal history of retained foreign body fully removed: Secondary | ICD-10-CM

## 2018-11-20 HISTORY — DX: Personal history of retained foreign body fully removed: Z87.821

## 2018-11-20 NOTE — Progress Notes (Signed)
CC'ED TO PCP 

## 2018-11-20 NOTE — Patient Instructions (Addendum)
I will run this by Dr. Oneida Alar. I don't believe we need to do anything right now.  Continue to follow the reflux diet that is attached here.  Continue omeprazole 30 minutes before breakfast daily. We will see you in 6 months for routine office visit!    Gastroesophageal Reflux Disease, Adult Gastroesophageal reflux (GER) happens when acid from the stomach flows up into the tube that connects the mouth and the stomach (esophagus). Normally, food travels down the esophagus and stays in the stomach to be digested. However, when a person has GER, food and stomach acid sometimes move back up into the esophagus. If this becomes a more serious problem, the person may be diagnosed with a disease called gastroesophageal reflux disease (GERD). GERD occurs when the reflux:  Happens often.  Causes frequent or severe symptoms.  Causes problems such as damage to the esophagus. When stomach acid comes in contact with the esophagus, the acid may cause soreness (inflammation) in the esophagus. Over time, GERD may create small holes (ulcers) in the lining of the esophagus. What are the causes? This condition is caused by a problem with the muscle between the esophagus and the stomach (lower esophageal sphincter, or LES). Normally, the LES muscle closes after food passes through the esophagus to the stomach. When the LES is weakened or abnormal, it does not close properly, and that allows food and stomach acid to go back up into the esophagus. The LES can be weakened by certain dietary substances, medicines, and medical conditions, including:  Tobacco use.  Pregnancy.  Having a hiatal hernia.  Alcohol use.  Certain foods and beverages, such as coffee, chocolate, onions, and peppermint. What increases the risk? You are more likely to develop this condition if you:  Have an increased body weight.  Have a connective tissue disorder.  Use NSAID medicines. What are the signs or symptoms? Symptoms of  this condition include:  Heartburn.  Difficult or painful swallowing.  The feeling of having a lump in the throat.  Abitter taste in the mouth.  Bad breath.  Having a large amount of saliva.  Having an upset or bloated stomach.  Belching.  Chest pain. Different conditions can cause chest pain. Make sure you see your health care provider if you experience chest pain.  Shortness of breath or wheezing.  Ongoing (chronic) cough or a night-time cough.  Wearing away of tooth enamel.  Weight loss. How is this diagnosed? Your health care provider will take a medical history and perform a physical exam. To determine if you have mild or severe GERD, your health care provider may also monitor how you respond to treatment. You may also have tests, including:  A test to examine your stomach and esophagus with a small camera (endoscopy).  A test thatmeasures the acidity level in your esophagus.  A test thatmeasures how much pressure is on your esophagus.  A barium swallow or modified barium swallow test to show the shape, size, and functioning of your esophagus. How is this treated? The goal of treatment is to help relieve your symptoms and to prevent complications. Treatment for this condition may vary depending on how severe your symptoms are. Your health care provider may recommend:  Changes to your diet.  Medicine.  Surgery. Follow these instructions at home: Eating and drinking   Follow a diet as recommended by your health care provider. This may involve avoiding foods and drinks such as: ? Coffee and tea (with or without caffeine). ? Drinks that  containalcohol. ? Energy drinks and sports drinks. ? Carbonated drinks or sodas. ? Chocolate and cocoa. ? Peppermint and mint flavorings. ? Garlic and onions. ? Horseradish. ? Spicy and acidic foods, including peppers, chili powder, curry powder, vinegar, hot sauces, and barbecue sauce. ? Citrus fruit juices and citrus  fruits, such as oranges, lemons, and limes. ? Tomato-based foods, such as red sauce, chili, salsa, and pizza with red sauce. ? Fried and fatty foods, such as donuts, french fries, potato chips, and high-fat dressings. ? High-fat meats, such as hot dogs and fatty cuts of red and white meats, such as rib eye steak, sausage, ham, and bacon. ? High-fat dairy items, such as whole milk, butter, and cream cheese.  Eat small, frequent meals instead of large meals.  Avoid drinking large amounts of liquid with your meals.  Avoid eating meals during the 2-3 hours before bedtime.  Avoid lying down right after you eat.  Do not exercise right after you eat. Lifestyle   Do not use any products that contain nicotine or tobacco, such as cigarettes, e-cigarettes, and chewing tobacco. If you need help quitting, ask your health care provider.  Try to reduce your stress by using methods such as yoga or meditation. If you need help reducing stress, ask your health care provider.  If you are overweight, reduce your weight to an amount that is healthy for you. Ask your health care provider for guidance about a safe weight loss goal. General instructions  Pay attention to any changes in your symptoms.  Take over-the-counter and prescription medicines only as told by your health care provider. Do not take aspirin, ibuprofen, or other NSAIDs unless your health care provider told you to do so.  Wear loose-fitting clothing. Do not wear anything tight around your waist that causes pressure on your abdomen.  Raise (elevate) the head of your bed about 6 inches (15 cm).  Avoid bending over if this makes your symptoms worse.  Keep all follow-up visits as told by your health care provider. This is important. Contact a health care provider if:  You have: ? New symptoms. ? Unexplained weight loss. ? Difficulty swallowing or it hurts to swallow. ? Wheezing or a persistent cough. ? A hoarse voice.  Your  symptoms do not improve with treatment. Get help right away if you:  Have pain in your arms, neck, jaw, teeth, or back.  Feel sweaty, dizzy, or light-headed.  Have chest pain or shortness of breath.  Vomit and your vomit looks like blood or coffee grounds.  Faint.  Have stool that is bloody or black.  Cannot swallow, drink, or eat. Summary  Gastroesophageal reflux happens when acid from the stomach flows up into the esophagus. GERD is a disease in which the reflux happens often, causes frequent or severe symptoms, or causes problems such as damage to the esophagus.  Treatment for this condition may vary depending on how severe your symptoms are. Your health care provider may recommend diet and lifestyle changes, medicine, or surgery.  Contact a health care provider if you have new or worsening symptoms.  Take over-the-counter and prescription medicines only as told by your health care provider. Do not take aspirin, ibuprofen, or other NSAIDs unless your health care provider told you to do so.  Keep all follow-up visits as told by your health care provider. This is important. This information is not intended to replace advice given to you by your health care provider. Make sure you discuss any questions  you have with your health care provider. Document Released: 02/08/2005 Document Revised: 11/07/2017 Document Reviewed: 11/07/2017 Elsevier Patient Education  2020 [employer].

## 2018-11-20 NOTE — Progress Notes (Signed)
Primary Care Physician:  Celene Squibb, MD  Primary GI: Dr. Oneida Alar   Chief Complaint  Patient presents with  . abdominal burning  . swallowed dental floss    occurred Sunday night, went to ED    HPI:   Kara [name] is a 73 y.o. female presenting today with a history of GERD, hyperplastic colon polyps and due for surveillance in 2021, last EGD in 2011 with esophagitis, gastritis, no H.pylori, small bowel biopsies negative for celiac.   On Sunday lost a "threader" used for flossing while flossing her teeth. Had felt like something was on the back of her tongue. Monday evening back of mouth was sore. Back of throat feels better now. Still sensitive. Yesterday started having RUQ discomfort/soreness, took gaviscon and now improved but still present. Yesterday for breakfast had cheerios and milk, dinner had lasagna. Monday, day before had [employer] chicken, spicy.  Gallbladder absent. Was told to eat bread at the ED. Has eaten a lot of bread for a few days. Has been on omeprazole for a few weeks after weaning off recently. Goes on and off depending no how she feels. Feels like omeprazole does a good job. No dysphagia.   Soft tissue xray of neck without foreign body.   Past Medical History:  Diagnosis Date  . Allergy    food allergies, drug allergies  . Anemia   . Anxiety   . Cataract   . Diabetes mellitus without complication (HCC)    no medication   . GERD (gastroesophageal reflux disease)   . Hyperlipidemia   . Thyroid disease     Past Surgical History:  Procedure Laterality Date  . ABDOMINAL HYSTERECTOMY     bleeding. fibroids  . CESAREAN SECTION    . CHOLECYSTECTOMY    . COLONOSCOPY  2011   [place]: two 3-4 mm sessile polyps, hyperplastic, sigmoid diverticula, TI appeared normal.   . ESOPHAGOGASTRODUODENOSCOPY  2011   [place]: non-bleeding erosive gastropathy, normal duodenum. path: unremarkable duodenum, negative H.pylori, minimal esophagitis   . TONSILLECTOMY  1975     Current Outpatient Medications  Medication Sig Dispense Refill  . Alum Hydroxide-Mag Carbonate (GAVISCON PO) Take by mouth as needed.    Marland Kitchen EPINEPHrine 0.3 mg/0.3 mL IJ SOAJ injection Inject 0.3 mLs (0.3 mg total) into the skin as needed. 1 Device 1  . estradiol (VIVELLE-DOT) 0.0375 MG/24HR Place 1 patch onto the skin 2 (two) times a week.    Marland Kitchen omeprazole (PRILOSEC) 20 MG capsule Take 20 mg by mouth as needed.    Marland Kitchen SYNTHROID 112 MCG tablet TAKE 1 TABLET BY MOUTH ONCE DAILY 90 tablet 0   No current facility-administered medications for this visit.     Allergies as of 11/20/2018 - Review Complete 11/20/2018  Allergen Reaction Noted  . Iodine Anaphylaxis 11/09/2015  . Other Anaphylaxis 01/20/2011  . Ibuprofen Other (See Comments) 11/09/2015  . Naproxen Other (See Comments) 11/09/2015  . Penicillins Swelling 11/09/2015    Family History  Problem Relation Age of Onset  . Cancer Maternal Grandmother        breast  . Breast cancer Maternal Grandmother   . Cancer Maternal Grandfather   . Cancer Paternal Grandmother   . Cancer Paternal Grandfather   . Breast cancer Paternal Grandfather   . Arthritis Mother   . Cancer Mother        breast  . COPD Mother   . Breast cancer Mother   . Cancer Father  throat  . Stroke Father   . Cancer Maternal Aunt [address] Lacks, cervical cancer  . Breast cancer Maternal Aunt   . Breast cancer Paternal Aunt   . Colon cancer Neg Hx     Social History   Socioeconomic History  . Marital status: Divorced    Spouse name: Not on file  . Number of children: 2  . Years of education: 32  . Highest education level: Not on file  Occupational History  . 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 Financial resource strain: Not hard at all  . Food insecurity    Worry: Never true    Inability: Never true  . Transportation needs    Medical: No    Non-medical: No  Tobacco Use   . Smoking status: Former Research scientist (life sciences)  . Smokeless tobacco: Never Used  . Tobacco comment: quit 2002  Substance and Sexual Activity  . Alcohol use: No  . Drug use: No  . Sexual activity: Not Currently    Birth control/protection: Surgical    Comment: hyst  Lifestyle  . Physical activity    Days per week: 0 days    Minutes per session: 0 min  . Stress: Rather much  Relationships  . Social connections    Talks on phone: More than three times a week    Gets together: More than three times a week    Attends religious service: 1 to 4 times per year    Active member of club or organization: Yes    Attends meetings of clubs or organizations: 1 to 4 times per year    Relationship status: Divorced  Other Topics Concern  . 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 Denies fatigue, weakness, weight loss.  CV: Denies chest pain, palpitations, syncope, peripheral edema, and claudication. Resp: Denies dyspnea at rest, cough, wheezing, coughing up blood, and pleurisy. GI: see HPI Derm: Denies rash, itching, dry skin Psych: Denies depression, anxiety, memory loss, confusion. No homicidal or suicidal ideation.  Heme: Denies bruising, bleeding, and enlarged lymph nodes.  Physical Exam: BP 113/67   Pulse 79   Temp 97.7 F (36.5 C) (Oral)   Ht 5\' 6"  (1.676 m)   Wt 210 lb 3.2 oz (95.3 kg)   BMI 33.93 kg/m  General:   Alert and oriented. No distress noted. Pleasant and cooperative.  Head:  Normocephalic and atraumatic. Eyes:  Conjuctiva clear without scleral icterus. Mouth:  Oral mucosa pink and moist.  Abdomen:  +BS, soft, mild tenderness RUQ but without rebound or guarding and non-distended. No HSM or masses noted. Msk:  Symmetrical without gross  deformities. Normal posture. Extremities:  Without edema. Neurologic:  Alert and  oriented x4 Psych:  Alert and cooperative. Normal mood and affect.

## 2018-11-20 NOTE — Assessment & Plan Note (Signed)
73 year old female presenting 3 days post accidentally swallowing a flossing "threader", which is soft/malleable, and without any acute findings on recent neck xray at hospital. She is asymptomatic today. Reassured this should pass. I feel RUQ discomfort is moreso related to ingestion of spicy chicken from [employer] and coincidental at this time. Continue omeprazole daily for GERD. Call if any concerns in interim. Return in 6 months for routine visit.

## 2018-11-24 ENCOUNTER — Encounter (HOSPITAL_COMMUNITY): Payer: Self-pay | Admitting: *Deleted

## 2018-11-24 ENCOUNTER — Emergency Department (HOSPITAL_COMMUNITY): Payer: Medicare Other

## 2018-11-24 ENCOUNTER — Emergency Department (HOSPITAL_COMMUNITY)
Admission: EM | Admit: 2018-11-24 | Discharge: 2018-11-24 | Disposition: A | Discharge status: Home / Self Care | Payer: Medicare Other | Attending: Emergency Medicine | Admitting: Emergency Medicine

## 2018-11-24 ENCOUNTER — Other Ambulatory Visit: Payer: Self-pay

## 2018-11-24 DIAGNOSIS — E119 Type 2 diabetes mellitus without complications: Secondary | ICD-10-CM | POA: Insufficient documentation

## 2018-11-24 DIAGNOSIS — R030 Elevated blood-pressure reading, without diagnosis of hypertension: Secondary | ICD-10-CM | POA: Diagnosis present

## 2018-11-24 DIAGNOSIS — T380X5A Adverse effect of glucocorticoids and synthetic analogues, initial encounter: Secondary | ICD-10-CM | POA: Diagnosis not present

## 2018-11-24 DIAGNOSIS — G44209 Tension-type headache, unspecified, not intractable: Secondary | ICD-10-CM | POA: Diagnosis not present

## 2018-11-24 DIAGNOSIS — Y69 Unspecified misadventure during surgical and medical care: Secondary | ICD-10-CM | POA: Insufficient documentation

## 2018-11-24 DIAGNOSIS — Z87891 Personal history of nicotine dependence: Secondary | ICD-10-CM | POA: Diagnosis not present

## 2018-11-24 DIAGNOSIS — T391X5A Adverse effect of 4-Aminophenol derivatives, initial encounter: Secondary | ICD-10-CM | POA: Diagnosis not present

## 2018-11-24 DIAGNOSIS — R51 Headache: Secondary | ICD-10-CM | POA: Diagnosis not present

## 2018-11-24 DIAGNOSIS — T887XXA Unspecified adverse effect of drug or medicament, initial encounter: Secondary | ICD-10-CM

## 2018-11-24 DIAGNOSIS — Z79899 Other long term (current) drug therapy: Secondary | ICD-10-CM | POA: Diagnosis not present

## 2018-11-24 DIAGNOSIS — R519 Headache, unspecified: Secondary | ICD-10-CM

## 2018-11-24 DIAGNOSIS — M79604 Pain in right leg: Secondary | ICD-10-CM

## 2018-11-24 MED ORDER — METOCLOPRAMIDE HCL 10 MG PO TABS
ORAL_TABLET | ORAL | Status: AC
  Administered 2018-11-24: 5 mg via ORAL
  Filled 2018-11-24: qty 1

## 2018-11-24 MED ORDER — METOCLOPRAMIDE HCL 10 MG PO TABS
5.0000 mg | ORAL_TABLET | Freq: Once | ORAL | Status: AC
  Administered 2018-11-24: 5 mg via ORAL

## 2018-11-24 NOTE — ED Triage Notes (Signed)
Pt states that her blood pressure has been elevated at home with a headache, bp on pt's machine prior to arrival was 161/98. Pt alert, denies any chest pain or sob.

## 2018-11-24 NOTE — ED Notes (Signed)
ED Provider at bedside. 

## 2018-11-24 NOTE — ED Provider Notes (Addendum)
[facility] EMERGENCY DEPARTMENT Provider Note   CSN: [id number] Arrival date & time: 11/24/18  0257  Time seen 4:15 AM  History   Chief Complaint Chief Complaint  Patient presents with  . Hypertension    HPI Kara [name] is a 73 y.o. female.     HPI patient states she was seen by her primary care doctor 2 weeks ago and she had "inflammation" in her right lower leg.  She states they thought she had a infection in her leg.  They prescribed her a prednisone Dosepak to take.  She however did not take it and was just taking Tylenol which she states helped.  However she states on July 11 the pain got worse.  She states about 1 PM she took 4 of the 5 mg tablets and then about 2 hours later took 2 more pills for a total of 30 mg today.  She states about 8 PM she started getting a headache.  She states the top of her head is throbbing.  She describes it as a pressure.  She states her blood pressure at home was 135/93 and 154/93 and she got extremely concerned and thought she had hypertension that needed to be treated.  She denies any nausea, vomiting, visual changes, numbness or tingling of her extremities.  She states she has a family history of strokes but no family history of headaches.  She states she does not normally get headaches.  She is never had this before.  PCP Celene Squibb, MD   Past Medical History:  Diagnosis Date  . Allergy    food allergies, drug allergies  . Anemia   . Anxiety   . Cataract   . Diabetes mellitus without complication (HCC)    no medication   . GERD (gastroesophageal reflux disease)   . Hyperlipidemia   . Thyroid disease     Patient Active Problem List   Diagnosis Date Noted  . H/O swallowed foreign body 11/20/2018  . Change in bowel function 04/09/2018  . Breast pain, left 02/25/2018  . Prediabetes 08/30/2017  . Dyspepsia 09/29/2016  . Colon polyps 08/23/2016  . Family history of cancer 08/23/2016  . HLD (hyperlipidemia) 08/23/2016  .  Obesity, Class II, BMI 35-39.9, with comorbidity 08/23/2016  . GERD (gastroesophageal reflux disease) 08/14/2016  . Hypothyroid 08/14/2016  . Perimenopausal vasomotor symptoms 02/11/2016  . Postmenopausal atrophic vaginitis 11/09/2015    Past Surgical History:  Procedure Laterality Date  . ABDOMINAL HYSTERECTOMY     bleeding. fibroids  . CESAREAN SECTION    . CHOLECYSTECTOMY    . COLONOSCOPY  2011   [place]: two 3-4 mm sessile polyps, hyperplastic, sigmoid diverticula, TI appeared normal.   . ESOPHAGOGASTRODUODENOSCOPY  2011   [place]: non-bleeding erosive gastropathy, normal duodenum. path: unremarkable duodenum, negative H.pylori, minimal esophagitis   . TONSILLECTOMY  1975     OB History    Gravida  3   Para  2   Term      Preterm      AB  1   Living        SAB  1   TAB      Ectopic      Multiple      Live Births               Home Medications    Prior to Admission medications   Medication Sig Start Date End Date Taking? Authorizing Provider  Alum Hydroxide-Mag Carbonate (GAVISCON PO) Take by mouth  as needed.    [provider]  EPINEPHrine 0.3 mg/0.3 mL IJ SOAJ injection Inject 0.3 mLs (0.3 mg total) into the skin as needed. 11/28/16   Raylene Everts, MD  estradiol (VIVELLE-DOT) 0.0375 MG/24HR Place 1 patch onto the skin 2 (two) times a week.    [provider]  omeprazole (PRILOSEC) 20 MG capsule Take 20 mg by mouth as needed.    [provider]  SYNTHROID 112 MCG tablet TAKE 1 TABLET BY MOUTH ONCE DAILY 10/16/17   Caren Macadam, MD    Family History Family History  Problem Relation Age of Onset  . Cancer Maternal Grandmother        breast  . Breast cancer Maternal Grandmother   . Cancer Maternal Grandfather   . Cancer Paternal Grandmother   . Cancer Paternal Grandfather   . Breast cancer Paternal Grandfather   . Arthritis Mother   . Cancer Mother        breast  . COPD Mother   . Breast cancer Mother   .  Cancer Father        throat  . Stroke Father   . Cancer Maternal Aunt [address] Lacks, cervical cancer  . Breast cancer Maternal Aunt   . Breast cancer Paternal Aunt   . Colon cancer Neg Hx     Social History Social History   Tobacco Use  . Smoking status: Former Research scientist (life sciences)  . Smokeless tobacco: Never Used  . Tobacco comment: quit 2002  Substance Use Topics  . Alcohol use: No  . Drug use: No     Allergies   Iodine, Other, Ibuprofen, Naproxen, and Penicillins   Review of Systems Review of Systems  All other systems reviewed and are negative.    Physical Exam Updated Vital Signs BP 132/85 (BP Location: Left Arm)   Pulse (!) 117   Temp 97.9 F (36.6 C)   Resp 20   Ht 5\' 6"  (1.676 m)   Wt 95.3 kg   SpO2 95%   BMI 33.91 kg/m   Physical Exam Vitals signs and nursing note reviewed.  Constitutional:      General: She is not in acute distress.    Appearance: Normal appearance. She is well-developed. She is obese. She is not ill-appearing or toxic-appearing.  HENT:     Head: Normocephalic and atraumatic.     Right Ear: External ear normal.     Left Ear: External ear normal.     Nose: Nose normal. No mucosal edema or rhinorrhea.     Mouth/Throat:     Mouth: Mucous membranes are moist.     Dentition: No dental abscesses.     Pharynx: No uvula swelling.  Eyes:     Extraocular Movements: Extraocular movements intact.     Conjunctiva/sclera: Conjunctivae normal.     Pupils: Pupils are equal, round, and reactive to light.  Neck:     Musculoskeletal: Full passive range of motion without pain, normal range of motion and neck supple.  Cardiovascular:     Rate and Rhythm: Normal rate and regular rhythm.     Heart sounds: Normal heart sounds. No murmur. No friction rub. No gallop.   Pulmonary:     Effort: Pulmonary effort is normal. No respiratory distress.     Breath sounds: Normal breath sounds. No wheezing, rhonchi or rales.  Chest:     Chest wall: No  tenderness or crepitus.  Musculoskeletal: Normal range of motion.  General: No tenderness.     Comments: Moves all extremities well.  When I look at her lower leg there is no redness of the skin, no warmth.  She complains of pain below her knee, not in the knee joint.  Her knee is nontender to palpation and without effusion.  There is no cords felt.  Skin:    General: Skin is warm and dry.     Coloration: Skin is not pale.     Findings: No erythema or rash.  Neurological:     General: No focal deficit present.     Mental Status: She is alert and oriented to person, place, and time.     Cranial Nerves: No cranial nerve deficit.  Psychiatric:        Mood and Affect: Mood normal. Mood is not anxious.        Speech: Speech normal.        Behavior: Behavior normal.        Thought Content: Thought content normal.      ED Treatments / Results  Labs (all labs ordered are listed, but only abnormal results are displayed) Labs Reviewed - No data to display  EKG None  Radiology Ct Head Wo Contrast  Result Date: 11/24/2018 CLINICAL DATA:  Patient took 1st dose of prednisone today and started having a headache today. BP machine at home showed 161/98. No chest pain or shortness of breath. Patient states she feels fine aside from a headache EXAM: CT HEAD WITHOUT CONTRAST TECHNIQUE: Contiguous axial images were obtained from the base of the skull through the vertex without intravenous contrast. COMPARISON:  None. FINDINGS: Brain: No evidence of acute infarction, hemorrhage, hydrocephalus, extra-axial collection or mass lesion/mass effect. Vascular: No hyperdense vessel or unexpected calcification. Skull: Normal. Negative for fracture or focal lesion. Sinuses/Orbits: Visualized globes and orbits are unremarkable. The visualized sinuses and mastoid air cells are clear. Other: None. IMPRESSION: Normal unenhanced CT scan of the brain. Electronically Signed   By: Lajean Manes M.D.   On: 11/24/2018  05:56    Procedures Procedures (including critical care time)  Medications Ordered in ED Medications  metoCLOPramide (REGLAN) tablet 5 mg (5 mg Oral Given 11/24/18 0430)     Initial Impression / Assessment and Plan / ED Course  I have reviewed the triage vital signs and the nursing notes.  Pertinent labs & imaging results that were available during my care of the patient were reviewed by me and considered in my medical decision making (see chart for details).    It is not clear to me what the problem in her leg is, there is no obvious evidence for cellulitis, concern would be possible DVT.  I will schedule her for an outpatient Doppler ultrasound.  She does not have a history of headaches and has a headache now after taking her first dose of steroids.  CT of the head was done.  Patient was given low-dose oral Reglan, she did not want any IV medication.  She is very hypervigilant.  Her blood pressure in the ED is 135/65 and she is very upset with me for not treating her blood pressure.  She also is concerned because I told her to stop the prednisone and she does not feel like you can just stop taking it, I reassured her she only took 1 dose she can stop it.  Only people have been on it for longer period time have concerns about stopping it.  Recheck at 6 AM patient states  her headache is much improved and almost gone.  We discussed her CT result which is normal.  We discussed her returning to have a Doppler ultrasound to evaluate her complaints of leg swelling and pain.  I also recommended she stop the steroids.  Patient seems to have lots of concerns about taking the steroids and also lots of concerns about stopping it.  I did not give her Lovenox until I know for sure if she has a DVT due to her new onset headache.  Patient's blood pressure at time of discharge was 132/85  Final Clinical Impressions(s) / ED Diagnoses   Final diagnoses:  Acute nonintractable headache, unspecified headache  type  Side effect of drug    ED Discharge Orders         Ordered    US Venous Img Lower Unilateral Right     11/24/18 0517          Plan discharge  Rolland Porter, MD, Barbette Or, MD 11/24/18 [place], Viola, MD 11/24/18 [phone]

## 2018-11-24 NOTE — ED Notes (Signed)
Patient took 1st dose of prednisone today and started having a headache today. BP machine at home showed 161/98.  No chest pain or shortness of breath. Patient states she feels fine aside from a headache.

## 2018-11-24 NOTE — Discharge Instructions (Addendum)
Elevate your leg.  Use heat for comfort.  I would recommend stopping the prednisone.  You can take Tylenol as needed for headache.  You need to call radiology after 830 this morning to get an appointment to have your leg checked for a blood clot.  [phone]

## 2018-11-25 ENCOUNTER — Ambulatory Visit (HOSPITAL_COMMUNITY)
Admission: RE | Admit: 2018-11-25 | Discharge: 2018-11-25 | Disposition: A | Discharge status: Home / Self Care | Payer: Medicare Other | Source: Ambulatory Visit | Attending: Emergency Medicine | Admitting: Emergency Medicine

## 2018-11-25 DIAGNOSIS — R600 Localized edema: Secondary | ICD-10-CM | POA: Diagnosis not present

## 2018-11-25 DIAGNOSIS — M79604 Pain in right leg: Secondary | ICD-10-CM | POA: Diagnosis not present

## 2018-11-25 DIAGNOSIS — M79661 Pain in right lower leg: Secondary | ICD-10-CM | POA: Diagnosis not present

## 2018-11-28 ENCOUNTER — Ambulatory Visit: Payer: Medicare Other | Admitting: Orthopaedic Surgery

## 2018-11-28 DIAGNOSIS — R42 Dizziness and giddiness: Secondary | ICD-10-CM | POA: Diagnosis not present

## 2018-11-28 DIAGNOSIS — R002 Palpitations: Secondary | ICD-10-CM | POA: Diagnosis not present

## 2018-11-28 DIAGNOSIS — E860 Dehydration: Secondary | ICD-10-CM | POA: Diagnosis not present

## 2018-11-28 DIAGNOSIS — R300 Dysuria: Secondary | ICD-10-CM | POA: Diagnosis not present

## 2018-11-28 DIAGNOSIS — K219 Gastro-esophageal reflux disease without esophagitis: Secondary | ICD-10-CM | POA: Diagnosis not present

## 2018-11-28 DIAGNOSIS — R079 Chest pain, unspecified: Secondary | ICD-10-CM | POA: Diagnosis not present

## 2018-11-28 DIAGNOSIS — G4700 Insomnia, unspecified: Secondary | ICD-10-CM | POA: Diagnosis not present

## 2018-11-28 DIAGNOSIS — Z136 Encounter for screening for cardiovascular disorders: Secondary | ICD-10-CM | POA: Diagnosis not present

## 2018-11-28 DIAGNOSIS — J060 Acute laryngopharyngitis: Secondary | ICD-10-CM | POA: Diagnosis not present

## 2018-11-28 DIAGNOSIS — E039 Hypothyroidism, unspecified: Secondary | ICD-10-CM | POA: Diagnosis not present

## 2018-11-28 DIAGNOSIS — E782 Mixed hyperlipidemia: Secondary | ICD-10-CM | POA: Diagnosis not present

## 2018-11-28 DIAGNOSIS — M25512 Pain in left shoulder: Secondary | ICD-10-CM | POA: Diagnosis not present

## 2018-11-28 DIAGNOSIS — E785 Hyperlipidemia, unspecified: Secondary | ICD-10-CM | POA: Diagnosis not present

## 2018-11-28 DIAGNOSIS — R0683 Snoring: Secondary | ICD-10-CM | POA: Diagnosis not present

## 2018-12-03 ENCOUNTER — Encounter: Payer: Self-pay | Admitting: Orthopaedic Surgery

## 2018-12-03 ENCOUNTER — Ambulatory Visit: Payer: Medicare Other

## 2018-12-03 ENCOUNTER — Other Ambulatory Visit: Payer: Self-pay | Admitting: Orthopaedic Surgery

## 2018-12-03 ENCOUNTER — Ambulatory Visit (INDEPENDENT_AMBULATORY_CARE_PROVIDER_SITE_OTHER): Payer: Medicare Other | Admitting: Orthopaedic Surgery

## 2018-12-03 ENCOUNTER — Other Ambulatory Visit: Payer: Self-pay

## 2018-12-03 VITALS — BP 94/60 | HR 90 | Temp 97.1°F | Ht 66.0 in | Wt 205.0 lb

## 2018-12-03 DIAGNOSIS — G8929 Other chronic pain: Secondary | ICD-10-CM

## 2018-12-03 DIAGNOSIS — M25561 Pain in right knee: Secondary | ICD-10-CM

## 2018-12-03 DIAGNOSIS — R600 Localized edema: Secondary | ICD-10-CM

## 2018-12-03 DIAGNOSIS — M79661 Pain in right lower leg: Secondary | ICD-10-CM | POA: Diagnosis not present

## 2018-12-03 DIAGNOSIS — H04123 Dry eye syndrome of bilateral lacrimal glands: Secondary | ICD-10-CM | POA: Diagnosis not present

## 2018-12-03 DIAGNOSIS — H538 Other visual disturbances: Secondary | ICD-10-CM | POA: Diagnosis not present

## 2018-12-03 MED ORDER — DIAZEPAM 10 MG PO TABS: 10.0000 mg | ORAL_TABLET | Freq: Once | ORAL | 0 refills | Status: AC

## 2018-12-03 NOTE — Progress Notes (Signed)
Patient Kara [name]:EGBTDVVOH Koran, female DOB:1945/10/18, 73 y.o. YWV:[id number]  Chief Complaint  Patient presents with  . Knee Pain    Rt knee. Pain was the whole leg but is now just in the knee    HPI  Kara [name] is a 73 y.o. female who has right lower leg swelling and pain and has developed right knee pain.  When she was last here I gave her prednisone dose pack. She got it filled but did not take it until 11-24-2018.  After taking the medicine she had a very severe headache and felt sick.  She went to the ER. CT of head/brain was done. She had a reaction to the prednisone and told not to take it again.  She had doppler study of the right lower leg which was negative.  She has been using support hose which have helped and lessened the swelling of the right lower leg.  She has developed pain in the right knee knee now and some swelling.  She has no trauma. She has no redness of the lower leg, no wound or drainage.  She has no giving way of the knee but has pain medially and swelling and popping.   Body mass index is 33.09 kg/m.  ROS  Review of Systems  Constitutional: Positive for activity change.  Musculoskeletal: Positive for arthralgias and gait problem.  All other systems reviewed and are negative.   All other systems reviewed and are negative.  The following is a summary of the past history medically, past history surgically, known current medicines, social history and family history.  This information is gathered electronically by the computer from prior information and documentation.  I review this each visit and have found including this information at this point in the chart is beneficial and informative.    Past Medical History:  Diagnosis Date  . Allergy    food allergies, drug allergies  . Anemia   . Anxiety   . Cataract   . Diabetes mellitus without complication (HCC)    no medication   . GERD (gastroesophageal reflux disease)   . Hyperlipidemia   . Thyroid  disease     Past Surgical History:  Procedure Laterality Date  . ABDOMINAL HYSTERECTOMY     bleeding. fibroids  . CESAREAN SECTION    . CHOLECYSTECTOMY    . COLONOSCOPY  2011   [place]: two 3-4 mm sessile polyps, hyperplastic, sigmoid diverticula, TI appeared normal.   . ESOPHAGOGASTRODUODENOSCOPY  2011   [place]: non-bleeding erosive gastropathy, normal duodenum. path: unremarkable duodenum, negative H.pylori, minimal esophagitis   . TONSILLECTOMY  1975    Family History  Problem Relation Age of Onset  . Cancer Maternal Grandmother        breast  . Breast cancer Maternal Grandmother   . Cancer Maternal Grandfather   . Cancer Paternal Grandmother   . Cancer Paternal Grandfather   . Breast cancer Paternal Grandfather   . Arthritis Mother   . Cancer Mother        breast  . COPD Mother   . Breast cancer Mother   . Cancer Father        throat  . Stroke Father   . Cancer Maternal Aunt [address] Lacks, cervical cancer  . Breast cancer Maternal Aunt   . Breast cancer Paternal Aunt   . Colon cancer Neg Hx     Social History Social History   Tobacco Use  . Smoking status: Former Research scientist (life sciences)  .  Smokeless tobacco: Never Used  . Tobacco comment: quit 2002  Substance Use Topics  . Alcohol use: No  . Drug use: No    Allergies  Allergen Reactions  . Iodine Anaphylaxis  . Other Anaphylaxis    Allergic to seafood and nuts  . Prednisone   . Ibuprofen Other (See Comments)    Uncertain - headache  . Naproxen Other (See Comments)    headache  . Penicillins Swelling    Current Outpatient Medications  Medication Sig Dispense Refill  . Alum Hydroxide-Mag Carbonate (GAVISCON PO) Take by mouth as needed.    . diazepam (VALIUM) 10 MG tablet Take 1 tablet (10 mg total) by mouth once for 1 dose. Take one tablet about an hour before MRI exam.  Someone must drive you. 1 tablet 0  . EPINEPHrine 0.3 mg/0.3 mL IJ SOAJ injection Inject 0.3 mLs (0.3 mg total) into the skin as  needed. 1 Device 1  . estradiol (VIVELLE-DOT) 0.0375 MG/24HR Place 1 patch onto the skin 2 (two) times a week.    Marland Kitchen omeprazole (PRILOSEC) 20 MG capsule Take 20 mg by mouth as needed.    Marland Kitchen SYNTHROID 112 MCG tablet TAKE 1 TABLET BY MOUTH ONCE DAILY 90 tablet 0   No current facility-administered medications for this visit.      Physical Exam  Blood pressure 94/60, pulse 90, temperature (!) 97.1 F (36.2 C), height 5\' 6"  (1.676 m), weight 205 lb (93 kg).  Constitutional: overall normal hygiene, normal nutrition, well developed, normal grooming, normal body habitus. Assistive device:support hose to knee right  Musculoskeletal: gait and station Limp right, muscle tone and strength are normal, no tremors or atrophy is present.  .  Neurological: coordination overall normal.  Deep tendon reflex/nerve stretch intact.  Sensation normal.  Cranial nerves II-XII intact.   Skin:   Normal overall no scars, lesions, ulcers or rashes. No psoriasis.  Psychiatric: Alert and oriented x 3.  Recent memory intact, remote memory unclear.  Normal mood and affect. Well groomed.  Good eye contact.  Cardiovascular: overall she has swelling of the right lower leg but no calf tenderness, no redness,no wound, no clubbing, no  cyanosis and good capillary refill.  Lymphatic: palpation is normal.  The right knee has slight effusion, ROM 0 to 105, crepitus, medial pain, limp right, NV intact.  Weakly positive medial Mc[place].  All other systems reviewed and are negative   The patient has been educated about the nature of the problem(s) and counseled on treatment options.  The patient appeared to understand what I have discussed and is in agreement with it.  Encounter Diagnoses  Name Primary?  . Chronic pain of right knee Yes  . Pain in right lower leg   . Leg edema, right    X-rays were done of the right knee, reported separately.  I will get MRI of the right knee.  I am concerned about possible meniscus  tear.  I have reviewed the ER records from 11-24-2018, the CT of head and Doppler study.   PLAN Call if any problems.  Precautions discussed.  Continue current medications.   Return to clinic after MRI of the right knee.   I have offered appointment to see vascular surgeon.  She wants to wait.  With her reaction to the prednisone, she needs to list this as an allergy.  I cannot inject the knee.  She understands.  She is to continue the support hose.  Electronically Signed Sanjuana Kava, MD 7/21/202011:15  AM

## 2018-12-04 ENCOUNTER — Other Ambulatory Visit (HOSPITAL_COMMUNITY): Payer: Self-pay | Admitting: Internal Medicine

## 2018-12-04 DIAGNOSIS — Z780 Asymptomatic menopausal state: Secondary | ICD-10-CM

## 2018-12-10 DIAGNOSIS — H0102B Squamous blepharitis left eye, upper and lower eyelids: Secondary | ICD-10-CM | POA: Diagnosis not present

## 2018-12-10 DIAGNOSIS — H01111 Allergic dermatitis of right upper eyelid: Secondary | ICD-10-CM | POA: Diagnosis not present

## 2018-12-10 DIAGNOSIS — H0102A Squamous blepharitis right eye, upper and lower eyelids: Secondary | ICD-10-CM | POA: Diagnosis not present

## 2018-12-10 DIAGNOSIS — H04123 Dry eye syndrome of bilateral lacrimal glands: Secondary | ICD-10-CM | POA: Diagnosis not present

## 2018-12-12 DIAGNOSIS — M79642 Pain in left hand: Secondary | ICD-10-CM | POA: Diagnosis not present

## 2018-12-17 DIAGNOSIS — M79645 Pain in left finger(s): Secondary | ICD-10-CM | POA: Diagnosis not present

## 2018-12-20 DIAGNOSIS — H01111 Allergic dermatitis of right upper eyelid: Secondary | ICD-10-CM | POA: Diagnosis not present

## 2018-12-20 DIAGNOSIS — H0102A Squamous blepharitis right eye, upper and lower eyelids: Secondary | ICD-10-CM | POA: Diagnosis not present

## 2018-12-20 DIAGNOSIS — H0102B Squamous blepharitis left eye, upper and lower eyelids: Secondary | ICD-10-CM | POA: Diagnosis not present

## 2018-12-20 DIAGNOSIS — H04123 Dry eye syndrome of bilateral lacrimal glands: Secondary | ICD-10-CM | POA: Diagnosis not present

## 2018-12-23 ENCOUNTER — Ambulatory Visit (INDEPENDENT_AMBULATORY_CARE_PROVIDER_SITE_OTHER): Payer: Medicare Other | Admitting: Family Medicine

## 2018-12-23 ENCOUNTER — Telehealth: Payer: Self-pay

## 2018-12-23 ENCOUNTER — Other Ambulatory Visit: Payer: Self-pay

## 2018-12-23 VITALS — BP 95/62 | HR 79 | Temp 98.4°F | Ht 66.0 in | Wt 207.0 lb

## 2018-12-23 DIAGNOSIS — F5102 Adjustment insomnia: Secondary | ICD-10-CM | POA: Diagnosis not present

## 2018-12-23 DIAGNOSIS — T148XXA Other injury of unspecified body region, initial encounter: Secondary | ICD-10-CM | POA: Diagnosis not present

## 2018-12-23 DIAGNOSIS — Z23 Encounter for immunization: Secondary | ICD-10-CM | POA: Diagnosis not present

## 2018-12-23 HISTORY — DX: Adjustment insomnia: F51.02

## 2018-12-23 NOTE — Telephone Encounter (Signed)
LeighAnn Aamina Skiff, CMA  

## 2018-12-23 NOTE — Progress Notes (Signed)
New Patient Office Visit  Subjective:  Patient ID: Kara [name], female    DOB: 05-Jun-1945  Age: 73 y.o. MRN: [id number]  CC:  Chief Complaint  Patient presents with  . New Patient (Initial Visit)  splinter in finger for 3-4 weeks  HPI Kara [name] presents for left 3rd finger with splinter-previously given antibiotics and removal of part of splinter Hypothyroid-TSH due to Sept-2020-Dr. [place] Crane Pre-diabetes-weight loss, no meds currently-fasting 135glucose-weight watchers, walking Insomnia-early morning waking, interrrupted sleep, startled upon waking, no difficulty going to sleep 10-11-normal bedtime.  Waking Off and on through the night.  Wake up and watch TV, internet.  Pt with stress due to putting Mom in NH.  Pt has not called sleep center for sleep testing recommended by other primary care doctor  Past Medical History:  Diagnosis Date  . Allergy    food allergies, drug allergies  . Anemia   . Anxiety   . Cataract   . Diabetes mellitus without complication (HCC)    no medication   . GERD (gastroesophageal reflux disease)   . Hyperlipidemia   . Thyroid disease     Past Surgical History:  Procedure Laterality Date  . ABDOMINAL HYSTERECTOMY     bleeding. fibroids  . CESAREAN SECTION    . CHOLECYSTECTOMY    . COLONOSCOPY  2011   [place]: two 3-4 mm sessile polyps, hyperplastic, sigmoid diverticula, TI appeared normal.   . ESOPHAGOGASTRODUODENOSCOPY  2011   [place]: non-bleeding erosive gastropathy, normal duodenum. path: unremarkable duodenum, negative H.pylori, minimal esophagitis   . TONSILLECTOMY  1975    Family History  Problem Relation Age of Onset  . Cancer Maternal Grandmother        breast  . Breast cancer Maternal Grandmother   . Cancer Maternal Grandfather   . Cancer Paternal Grandmother   . Cancer Paternal Grandfather   . Breast cancer Paternal Grandfather   . Arthritis Mother   . Cancer Mother        breast  . COPD Mother   . Breast cancer  Mother   . Cancer Father        throat  . Stroke Father   . Cancer Maternal Aunt [address] Lacks, cervical cancer  . Breast cancer Maternal Aunt   . Breast cancer Paternal Aunt   . Colon cancer Neg Hx     Social History   Socioeconomic History  . Marital status: Divorced    Spouse name: Not on file  . Number of children: 2  . Years of education: 43  . Highest education level: Not on file  Occupational History  . 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 Financial resource strain: Not hard at all  . Food insecurity    Worry: Never true    Inability: Never true  . Transportation needs    Medical: No    Non-medical: No  Tobacco Use  . Smoking status: Former Research scientist (life sciences)  . Smokeless tobacco: Never Used  . Tobacco comment: quit 2002  Substance and Sexual Activity  . Alcohol use: No  . Drug use: No  . Sexual activity: Not Currently    Birth control/protection: Surgical    Comment: hyst  Lifestyle  . Physical activity    Days per week: 0 days    Minutes per session: 0 min  . Stress: Rather much  Relationships  . Social connections    Talks  on phone: More than three times a week    Gets together: More than three times a week    Attends religious service: 1 to 4 times per year    Active member of club or organization: Yes    Attends meetings of clubs or organizations: 1 to 4 times per year    Relationship status: Divorced  . Intimate partner violence    Fear of current or ex partner: No    Emotionally abused: No    Physically abused: No    Forced sexual activity: No  Other Topics Concern  . 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  Gastrointestinal:  Negative for constipation.  Skin: Positive for wound.  Psychiatric/Behavioral: Positive for sleep disturbance.    Objective:   Today's Vitals: BP 95/62 (BP Location: Left Arm, Patient Position: Sitting, Cuff Size: Normal)   Pulse 79   Temp 98.4 F (36.9 C) (Oral)   Ht 5\' 6"  (1.676 m)   Wt 207 lb (93.9 kg)   SpO2 97%   BMI 33.41 kg/m   Physical Exam Vitals signs and nursing note reviewed.  Constitutional:      Appearance: Normal appearance.  HENT:     Head: Normocephalic and atraumatic.  Cardiovascular:     Rate and Rhythm: Normal rate and regular rhythm.     Pulses: Normal pulses.     Heart sounds: Normal heart sounds.  Pulmonary:     Effort: Pulmonary effort is normal.     Breath sounds: Normal breath sounds.  Skin:    Findings: Erythema present.  Neurological:     Mental Status: She is alert.   left third finger-eryth, tenderness to touch, callous Verbal consent given by pt Pt soaked finger for 22min Area cleared with skin curette  Removal of foreign body Sterile water used to clear area Dressing replaced Skin care discussed  Assessment & Plan:    Outpatient Encounter Medications as of 12/23/2018  Medication Sig  . Alum Hydroxide-Mag Carbonate (GAVISCON PO) Take by mouth as needed.  Marland Kitchen EPINEPHrine 0.3 mg/0.3 mL IJ SOAJ injection Inject 0.3 mLs (0.3 mg total) into the skin as needed.  Marland Kitchen estradiol (VIVELLE-DOT) 0.0375 MG/24HR Place 1 patch onto the skin 2 (two) times a week.  Marland Kitchen omeprazole (PRILOSEC) 20 MG capsule Take 20 mg by mouth as needed.  Marland Kitchen SYNTHROID 112 MCG tablet TAKE 1 TABLET BY MOUTH ONCE DAILY (Patient taking differently: Take 100 mcg by mouth AC breakfast. )   No facility-administered encounter medications on file as of 12/23/2018.    1. Splinter in skin Left 3rd finger-bandage placed-topical antibiotic ointment Follow-up:  CODE for time 74min-d/w causes of insomnia, possible treatment options, testing D/w pt pt skin care after procedure for  foreign body removal Td-no recent eval-d/w pt often not covered by medicare-pt would like Td given 2. Insomnia due to stress Encouraged sleep study  Hannah Beat, MD

## 2018-12-24 ENCOUNTER — Telehealth: Payer: Self-pay | Admitting: Family Medicine

## 2018-12-24 NOTE — Telephone Encounter (Signed)
Pt has questions in regards to medications.

## 2018-12-24 NOTE — Telephone Encounter (Signed)
Kara [name] had questions regarding her Tramadol that the Orthodontist put her on, she is asking about her taking all the medication which she only has 2 pills left. I advised her to call her Dentist and have them assist her in her medication.

## 2018-12-28 ENCOUNTER — Other Ambulatory Visit: Payer: Medicare Other

## 2018-12-30 ENCOUNTER — Telehealth: Payer: Self-pay | Admitting: Family Medicine

## 2018-12-30 NOTE — Telephone Encounter (Signed)
Patient is calling and states when she had a root canal done they prescribe her tramadol and it made her have elevated blood pressure. They informed her to let her pcp know so it could be documented in her chart.

## 2018-12-30 NOTE — Telephone Encounter (Signed)
Done

## 2018-12-31 ENCOUNTER — Other Ambulatory Visit: Payer: Self-pay

## 2018-12-31 ENCOUNTER — Ambulatory Visit (INDEPENDENT_AMBULATORY_CARE_PROVIDER_SITE_OTHER): Payer: Medicare Other | Admitting: Family Medicine

## 2018-12-31 VITALS — BP 121/74 | HR 88 | Temp 98.4°F | Ht 66.0 in | Wt 200.6 lb

## 2018-12-31 DIAGNOSIS — R519 Headache, unspecified: Secondary | ICD-10-CM

## 2018-12-31 DIAGNOSIS — R51 Headache: Secondary | ICD-10-CM | POA: Diagnosis not present

## 2018-12-31 DIAGNOSIS — B370 Candidal stomatitis: Secondary | ICD-10-CM | POA: Diagnosis not present

## 2018-12-31 HISTORY — DX: Headache, unspecified: R51.9

## 2018-12-31 MED ORDER — NYSTATIN 100000 UNIT/ML MT SUSP: OROMUCOSAL | 0 refills | Status: DC

## 2018-12-31 NOTE — Patient Instructions (Addendum)
Nystatin swish and swallow-rx  mucinex 600mg  PLAIN Take one twice a day with food and plenty of water  Tylenol 500mg  1-2 q 4-6 hours prn pain

## 2018-12-31 NOTE — Progress Notes (Signed)
Established Patient Office Visit  Subjective:  Patient ID: Kara [name], female    DOB: 1945/09/11  Age: 73 y.o. MRN: [id number]  CC:  Chief Complaint  Patient presents with  . Hypertension    follow up     HPI Kara [name] presents for headache- 8/8-8/13-Tramadol taken for root canal. Filling 8/11 at Dentist, recheck on 8/13 normal findings per dentist.  8/14 pt noted pain related to the filling. 8/15 pt took Tramadol x 2 due to pain in tooth with the filling. Pt took blood pressure on 8/15-171/88 while in pain. Pt has not taken any additional Tramadol since Sat. Pt went to dental  9/17 -recheck filling, no concerns. Recommendation was to see the oral surgeon if continued pain due to teeth/gums.  Pt with concern for  headache now-talked to dentist. Pt states bp overnight 137/87. Pt states no headache prior to having root canal until pt tookTramadol. Previous allergies to ibuprofen and naproxen. Pt took prednisone in July x 1 dose with similar headache and was seen in the ER for headache and given Reglan.  Blood pressure 132/85 at discharge from ER. Pt with headache noted with ibuprofen, naproxen, tramadol and prednisone. Nasal congestion noted with drainage down the back of the throat-pt using vics to open her head. Pt with pain in sinuses-right side. No ear pain. No fever IMPRESSION: CT head while in ER for headache Normal unenhanced CT scan of the brain. pt with no concurrent blood pressure medication-takes estradiol and omeprazole.  No visual changes. No chest pain. Today headache overnight 8/10-tylenol taken then 5/10-currently 3/10 with no additional medication. Pt with difficulty sleeping overnight. Slept after taking tylenol. No nausea.    Past Medical History:  Diagnosis Date  . Allergy    food allergies, drug allergies  . Anemia   . Anxiety   . Cataract   . Diabetes mellitus without complication (HCC)    no medication   . GERD (gastroesophageal reflux disease)   .  Hyperlipidemia   . Thyroid disease     Past Surgical History:  Procedure Laterality Date  . ABDOMINAL HYSTERECTOMY     bleeding. fibroids  . CESAREAN SECTION    . CHOLECYSTECTOMY    . COLONOSCOPY  2011   [place]: two 3-4 mm sessile polyps, hyperplastic, sigmoid diverticula, TI appeared normal.   . ESOPHAGOGASTRODUODENOSCOPY  2011   [place]: non-bleeding erosive gastropathy, normal duodenum. path: unremarkable duodenum, negative H.pylori, minimal esophagitis   . TONSILLECTOMY  1975    Family History  Problem Relation Age of Onset  . Cancer Kara [name]        breast  . Breast cancer Kara [name]   . Cancer Kara Grandfather   . Cancer Paternal [name]   . Cancer Paternal Grandfather   . Breast cancer Paternal Grandfather   . Arthritis Kara [name]   . Cancer Kara [name]        breast  . COPD Kara [name]   . Breast cancer Kara [name]   . Cancer Kara [name]        throat  . Stroke Kara [name]   . Cancer Kara Aunt [address] Lacks, cervical cancer  . Breast cancer Kara Aunt   . Breast cancer Paternal Aunt   . Colon cancer Neg Hx     Social History   Socioeconomic History  . Marital status: Divorced    Spouse name: Not on file  . Number of children: 2  . Years of education: 64  . Highest  education level: Not on file  Occupational History  . 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 Financial resource strain: Not hard at all  . Food insecurity    Worry: Never true    Inability: Never true  . Transportation needs    Medical: No    Non-medical: No  Tobacco Use  . Smoking status: Former Research scientist (life sciences)  . Smokeless tobacco: Never Used  . Tobacco comment: quit 2002  Substance and Sexual Activity  . Alcohol use: No  . Drug use: No  . Sexual activity: Not Currently    Birth control/protection: Surgical    Comment: hyst  Lifestyle  . Physical activity    Days per week: 0 days    Minutes per session: 0  min  . Stress: Rather much  Relationships  . Social connections    Talks on phone: More than three times a week    Gets together: More than three times a week    Attends religious service: 1 to 4 times per year    Active member of club or organization: Yes    Attends meetings of clubs or organizations: 1 to 4 times per year    Relationship status: Divorced  . Intimate partner violence    Fear of current or ex partner: No    Emotionally abused: No    Physically abused: No    Forced sexual activity: No  Other Topics Concern  . 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 Alum Hydroxide-Mag Carbonate (GAVISCON PO) Take by mouth as needed.    Marland Kitchen EPINEPHrine 0.3 mg/0.3 mL IJ SOAJ injection Inject 0.3 mLs (0.3 mg total) into the skin as needed. 1 Device 1  . estradiol (VIVELLE-DOT) 0.0375 MG/24HR Place 1 patch onto the skin 2 (two) times a week.    Marland Kitchen omeprazole (PRILOSEC) 20 MG capsule Take 20 mg by mouth as needed.    Marland Kitchen SYNTHROID 112 MCG tablet TAKE 1 TABLET BY MOUTH ONCE DAILY (Patient taking differently: Take 100 mcg by mouth AC breakfast. ) 90 tablet 0   No facility-administered medications prior to visit.     Allergies  Allergen Reactions  . Iodine Anaphylaxis  . Other Anaphylaxis    Allergic to seafood and nuts  . Prednisone   . Ibuprofen Other (See Comments)    Uncertain - headache  . Naproxen Other (See Comments)    headache  . Penicillins Swelling    ROS Review of Systems  Constitutional: Negative for fatigue and fever.  HENT: Positive for congestion, dental problem, sinus pressure and sinus pain. Negative for ear pain, facial swelling, nosebleeds, rhinorrhea and sore throat.   Eyes: Negative for visual  disturbance.  Cardiovascular: Negative for chest pain and palpitations.  Neurological: Positive for headaches. Negative for dizziness.      Objective:    Physical Exam  Constitutional: She is oriented to person, place, and time. She appears well-developed and well-nourished. No distress.  HENT:  Head: Normocephalic and atraumatic.  Right Ear: External ear normal.  Left Ear: External ear normal.  Mouth/Throat: Oropharynx is clear and moist.  Cardiovascular: Normal  rate and regular rhythm.  Pulmonary/Chest: Effort normal and breath sounds normal.  Neurological: She is alert and oriented to person, place, and time. No cranial nerve deficit.  geographic tongue, hypopigmentation buccal mucosa bilat  Pacing in the room during exam  BP 121/74 (BP Location: Right Arm, Patient Position: Sitting, Cuff Size: Normal)   Pulse 88   Temp 98.4 F (36.9 C) (Oral)   Ht 5\' 6"  (1.676 m)   Wt 200 lb 9.6 oz (91 kg)   SpO2 98%   BMI 32.38 kg/m  Wt Readings from Last 3 Encounters:  12/31/18 200 lb 9.6 oz (91 kg)  12/23/18 207 lb (93.9 kg)  12/03/18 205 lb (93 kg)     Health Maintenance Due  Topic Date Due  . FOOT EXAM  07/19/1957  . PNA vac Low Risk Adult (2 of 2 - PPSV23) 07/05/2017  . HEMOGLOBIN A1C  02/15/2018  . OPHTHALMOLOGY EXAM  06/01/2018  . URINE MICROALBUMIN  08/17/2018  . INFLUENZA VACCINE  12/14/2018    There are no preventive care reminders to display for this patient.  Lab Results  Component Value Date   TSH 0.58 08/16/2017   Lab Results  Component Value Date   WBC 6.5 08/16/2017   HGB 13.4 08/16/2017   HCT 40.5 08/16/2017   MCV 78.8 (L) 08/16/2017   PLT 220 08/16/2017   Lab Results  Component Value Date   NA 139 08/16/2017   K 4.8 08/16/2017   CO2 29 08/16/2017   GLUCOSE 109 (H) 08/16/2017   BUN 14 08/16/2017   CREATININE 0.78 08/16/2017   BILITOT 0.5 08/16/2017   ALKPHOS 106 11/24/2016   AST 20 08/16/2017   ALT 20 08/16/2017   PROT 6.8 08/16/2017    ALBUMIN 3.7 11/24/2016   CALCIUM 9.4 08/16/2017   ANIONGAP 11 08/04/2017   Lab Results  Component Value Date   CHOL 203 (H) 08/16/2017   Lab Results  Component Value Date   HDL 56 08/16/2017   Lab Results  Component Value Date   LDLCALC 131 (H) 08/16/2017   Lab Results  Component Value Date   TRIG 64 08/16/2017   Lab Results  Component Value Date   CHOLHDL 3.6 08/16/2017   Lab Results  Component Value Date   HGBA1C 5.9 (H) 08/16/2017   1. Headache in back of head Tylenol 500mg  -max 6/day-avoid pain medication and NSAIDs At onset of headache take 2 tylenol-may repeat in 4-6 hours Have oral surgeon recheck area as directed by dentist mucinex BID 600mg  to relieve sinus pressure  2. Candida infection of mouth Nystatin-rx-use soft brush or gauze    Assessment & Plan:  Follow-up:  Prn-if headache persists -labwork and neuro referral Drink plenty of water. Take tylenol with onset of headache     Hannah Beat, MD

## 2019-01-02 ENCOUNTER — Telehealth: Payer: Self-pay | Admitting: Family Medicine

## 2019-01-02 NOTE — Telephone Encounter (Signed)
Increase dose to 1200mg  BID mucinex PLAIN-do not use in combo with DM or D  Increase water You can add nasal saline nose drops  DO NOT USE ANTIHISTAMINES like claritin, zyrtec or allegra

## 2019-01-02 NOTE — Telephone Encounter (Signed)
Patient is calling and says the mucinex she was told to take for her sinuses is not helping her, it is only drying her chest out.. please advise

## 2019-01-02 NOTE — Telephone Encounter (Signed)
Routing to Dr. Corum for Advice? 

## 2019-01-03 NOTE — Telephone Encounter (Signed)
Patient states that she is still having sinus trouble on her right side she feels like the musinex has caused her to hurt in her chest she denies taking any antihistamines and she has added saline nose spray

## 2019-01-06 ENCOUNTER — Other Ambulatory Visit: Payer: Self-pay | Admitting: Family Medicine

## 2019-01-06 ENCOUNTER — Telehealth: Payer: Self-pay

## 2019-01-06 DIAGNOSIS — J329 Chronic sinusitis, unspecified: Secondary | ICD-10-CM

## 2019-01-06 MED ORDER — DOXYCYCLINE HYCLATE 100 MG PO TABS: 100.0000 mg | ORAL_TABLET | Freq: Two times a day (BID) | ORAL | 0 refills | Status: DC

## 2019-01-06 NOTE — Telephone Encounter (Signed)
Kara [name], CMA  

## 2019-01-06 NOTE — Progress Notes (Signed)
Pt with sinus symptoms-no improvement with mucinex and nasal saline Add antibiotic since greater than 1 week with symptoms. Pt with Pen allergy Doxy 100mg  BID

## 2019-01-06 NOTE — Telephone Encounter (Signed)
Pt can add doxy as the antibiotic of choice for sinusitis in persons PEN allergic Take medication twice a day WITH FOOD Avoid sun exposure as increase in sun sensitivity while on the medication If no improvement in symptoms, or worsening symptoms, follow up office visit for additional evaluation

## 2019-01-06 NOTE — Telephone Encounter (Signed)
Patient is aware she states she had a tooth pulled Friday and and is some pain with that they gave her some clindamycin and she had a reaction to it and stopped it on Saturday and she states she is not sure how to treat her body at this time, I advised her the antibiotic is at the pharmacy if she decides to go pick it up

## 2019-01-07 ENCOUNTER — Telehealth: Payer: Self-pay | Admitting: *Deleted

## 2019-01-07 NOTE — Telephone Encounter (Signed)
Pt called and wanted to know if there was something she could take for her sinuses that was not a decongestant. She said the mucinex worked for her sinuses but it was a decongestant and it dried her chest out too much so wondering what she could take just for her sinuses

## 2019-01-07 NOTE — Telephone Encounter (Signed)
Patient is aware of the recommendations 

## 2019-01-07 NOTE — Telephone Encounter (Signed)
Sinusitis mucinex 600mg  -twice a day  drink plenty of water flonase Nasal saline Decongestants if tolerated during the day only-Not to be taken with HTN Tylenol for pain

## 2019-01-09 ENCOUNTER — Other Ambulatory Visit: Payer: Self-pay

## 2019-01-09 ENCOUNTER — Ambulatory Visit (INDEPENDENT_AMBULATORY_CARE_PROVIDER_SITE_OTHER): Payer: Medicare Other | Admitting: Family Medicine

## 2019-01-09 VITALS — BP 114/56 | HR 88 | Temp 97.9°F | Ht 66.0 in | Wt 196.8 lb

## 2019-01-09 DIAGNOSIS — F419 Anxiety disorder, unspecified: Secondary | ICD-10-CM | POA: Diagnosis not present

## 2019-01-09 DIAGNOSIS — F5102 Adjustment insomnia: Secondary | ICD-10-CM

## 2019-01-09 DIAGNOSIS — R519 Headache, unspecified: Secondary | ICD-10-CM

## 2019-01-09 DIAGNOSIS — R51 Headache: Secondary | ICD-10-CM

## 2019-01-09 DIAGNOSIS — J329 Chronic sinusitis, unspecified: Secondary | ICD-10-CM

## 2019-01-09 NOTE — Progress Notes (Signed)
Acute Office Visit  Subjective:    Patient ID: Kara [name], female    DOB: 1946/02/09, 73 y.o.   MRN: [id number]    Cc: Patient is in today for Patient took Tylenol today for headache and that has helped, sinuses are opened right now due to Nasal Saline spray. Patient went to the EMS Station last night due to tight squeezing pain in chest they did a 12 lead EKG and it was normal, they checked her pulse rate had a missing beat. Chest tightness and squeezing stopped once the Doxycycline had wore off. She hasnt taken any today and so far no chest tightness or squeezing has occurred.    HPI-pt states concern for chest pain related to Doxy given for possible sinus infection. Pt called with increase in sinus pressure with no improvement with mucinex on day #2 8/20 and recommended increasing dosing to 1200mg  BID for max dosage had caused chest tightness.  No facial pressure, site of tooth removal improving. No fever. No visual changes. No ear pain. Previously 12-23-18 clinic appt Kara [name] presents for headache- 8/8-8/13-Tramadol taken for root canal. Filling 8/11 at Dentist, recheck on 8/13 normal findings per dentist.  8/14 pt noted pain related to the filling. 8/15 pt took Tramadol x 2 due to pain in tooth with the filling. Pt took blood pressure on 8/15-171/88 while in pain. Pt has not taken any additional Tramadol since Sat. Pt went to dental  9/17 -recheck filling, no concerns. Recommendation was to see the oral surgeon if continued pain due to teeth/gums.  Pt with concern for  headache now-talked to dentist. Pt states bp overnight 137/87. Pt states no headache prior to having root canal until pt tookTramadol. Previous allergies to ibuprofen and naproxen. Pt took prednisone in July x 1 dose with similar headache and was seen in the ER for headache and given Reglan.  Blood pressure 132/85 at discharge from ER. Pt with headache noted with ibuprofen, naproxen, tramadol and prednisone. Nasal  congestion noted with drainage down the back of the throat-pt using vics to open her head. Pt with pain in sinuses-right side. No ear pain. No fever IMPRESSION: CT head while in ER for headache Normal unenhanced CT scan of the brain. pt with no concurrent blood pressure medication-takes estradiol and omeprazole.  No visual changes. No chest pain. Today headache overnight 8/10-tylenol taken then 5/10-currently 3/10 with no additional medication. Pt with difficulty sleeping overnight. Slept after taking tylenol. No nausea.  Past Medical History:  Diagnosis Date  . Allergy    food allergies, drug allergies  . Anemia   . Anxiety   . Cataract   . Diabetes mellitus without complication (HCC)    no medication   . GERD (gastroesophageal reflux disease)   . Hyperlipidemia   . Thyroid disease     Past Surgical History:  Procedure Laterality Date  . ABDOMINAL HYSTERECTOMY     bleeding. fibroids  . CESAREAN SECTION    . CHOLECYSTECTOMY    . COLONOSCOPY  2011   [place]: two 3-4 mm sessile polyps, hyperplastic, sigmoid diverticula, TI appeared normal.   . ESOPHAGOGASTRODUODENOSCOPY  2011   [place]: non-bleeding erosive gastropathy, normal duodenum. path: unremarkable duodenum, negative H.pylori, minimal esophagitis   . TONSILLECTOMY  1975    Family History  Problem Relation Age of Onset  . Cancer Maternal Grandmother        breast  . Breast cancer Maternal Grandmother   . Cancer Maternal Grandfather   . Cancer Paternal  Grandmother   . Cancer Paternal Grandfather   . Breast cancer Paternal Grandfather   . Arthritis Mother   . Cancer Mother        breast  . COPD Mother   . Breast cancer Mother   . Cancer Father        throat  . Stroke Father   . Cancer Maternal Aunt [address] Lacks, cervical cancer  . Breast cancer Maternal Aunt   . Breast cancer Paternal Aunt   . Colon cancer Neg Hx     Social History   Socioeconomic History  . Marital status: Divorced     Spouse name: Not on file  . Number of children: 2  . Years of education: 66  . Highest education level: Not on file  Occupational History  . 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 Financial resource strain: Not hard at all  . Food insecurity    Worry: Never true    Inability: Never true  . Transportation needs    Medical: No    Non-medical: No  Tobacco Use  . Smoking status: Former Research scientist (life sciences)  . Smokeless tobacco: Never Used  . Tobacco comment: quit 2002  Substance and Sexual Activity  . Alcohol use: No  . Drug use: No  . Sexual activity: Not Currently    Birth control/protection: Surgical    Comment: hyst  Lifestyle  . Physical activity    Days per week: 0 days    Minutes per session: 0 min  . Stress: Rather much  Relationships  . Social connections    Talks on phone: More than three times a week    Gets together: More than three times a week    Attends religious service: 1 to 4 times per year    Active member of club or organization: Yes    Attends meetings of clubs or organizations: 1 to 4 times per year    Relationship status: Divorced  . Intimate partner violence    Fear of current or ex partner: No    Emotionally abused: No    Physically abused: No    Forced sexual activity: No  Other Topics Concern  . 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 Alum Hydroxide-Mag Carbonate (GAVISCON PO) Take by mouth as needed.    . doxycycline (VIBRA-TABS) 100 MG tablet Take 1 tablet (100 mg total) by mouth 2 (two) times daily. 20 tablet 0  . EPINEPHrine 0.3 mg/0.3 mL IJ SOAJ injection Inject 0.3 mLs (0.3 mg total) into the skin as needed. 1 Device 1  . estradiol  (VIVELLE-DOT) 0.0375 MG/24HR Place 1 patch onto the skin 2 (two) times a week.    . nystatin (MYCOSTATIN) 100000 UNIT/ML suspension 1 tsp swish and spit ac meal and bedtime 60 mL 0  . omeprazole (PRILOSEC) 20 MG capsule Take 20 mg by mouth as needed.    Marland Kitchen SYNTHROID 112 MCG tablet TAKE 1 TABLET BY MOUTH ONCE DAILY (Patient taking differently: Take 100 mcg by mouth AC breakfast. ) 90 tablet 0   No facility-administered  medications prior to visit.     Allergies  Allergen Reactions  . Iodine Anaphylaxis  . Other Anaphylaxis    Allergic to seafood and nuts  . Prednisone   . Ibuprofen Other (See Comments)    Uncertain - headache  . Naproxen Other (See Comments)    headache  . Penicillins Swelling    Review of Systems  Constitutional: Negative for fever.  HENT: Positive for congestion and sinus pain. Negative for ear pain and sore throat.   Eyes: Negative for blurred vision.  Respiratory: Negative for cough and shortness of breath.   Cardiovascular: Positive for chest pain.  Gastrointestinal: Negative for nausea and vomiting.  Neurological: Positive for headaches. Negative for dizziness.       Objective:    Physical Exam  Constitutional: She is oriented to person, place, and time. She appears well-developed and well-nourished. No distress.  HENT:  Head: Normocephalic and atraumatic.  Right Ear: External ear normal.  Left Ear: External ear normal.  Nose: Nose normal.  Mouth/Throat: Oropharynx is clear and moist. No oropharyngeal exudate.  Site of dental extraction -no eryth, edema at site No pain with palpitation -TMJ NO pain palpation or mass right skull-temporal, occipital NO pain with palpation of sinuses-ethmoid, frontal, maxillary  Eyes: Conjunctivae are normal.  Neck: Normal range of motion. Neck supple.  Cardiovascular: Normal rate, regular rhythm and normal heart sounds.  Pulmonary/Chest: Effort normal and breath sounds normal.  Neurological: She is alert and  oriented to person, place, and time.  Psychiatric:  Appears anxious-walking around exam room    BP (!) 114/56 (BP Location: Left Arm, Patient Position: Sitting, Cuff Size: Normal)   Pulse 88   Temp 97.9 F (36.6 C) (Oral)   Ht 5\' 6"  (1.676 m)   Wt 196 lb 12.8 oz (89.3 kg)   SpO2 96%   BMI 31.76 kg/m  Wt Readings from Last 3 Encounters:  01/09/19 196 lb 12.8 oz (89.3 kg)  12/31/18 200 lb 9.6 oz (91 kg)  12/23/18 207 lb (93.9 kg)    Health Maintenance Due  Topic Date Due  . FOOT EXAM  07/19/1957  . PNA vac Low Risk Adult (2 of 2 - PPSV23) 07/05/2017  . HEMOGLOBIN A1C  02/15/2018  . OPHTHALMOLOGY EXAM  06/01/2018  . URINE MICROALBUMIN  08/17/2018  . INFLUENZA VACCINE  12/14/2018     Lab Results  Component Value Date   TSH 0.58 08/16/2017   Lab Results  Component Value Date   WBC 6.5 08/16/2017   HGB 13.4 08/16/2017   HCT 40.5 08/16/2017   MCV 78.8 (L) 08/16/2017   PLT 220 08/16/2017   Lab Results  Component Value Date   NA 139 08/16/2017   K 4.8 08/16/2017   CO2 29 08/16/2017   GLUCOSE 109 (H) 08/16/2017   BUN 14 08/16/2017   CREATININE 0.78 08/16/2017   BILITOT 0.5 08/16/2017   ALKPHOS 106 11/24/2016   AST 20 08/16/2017   ALT 20 08/16/2017   PROT 6.8 08/16/2017   ALBUMIN 3.7 11/24/2016   CALCIUM 9.4 08/16/2017   ANIONGAP 11 08/04/2017   Lab Results  Component Value Date   CHOL 203 (H) 08/16/2017   Lab Results  Component Value Date   HDL 56 08/16/2017   Lab Results  Component Value Date   LDLCALC 131 (H) 08/16/2017   Lab Results  Component Value Date   TRIG 64 08/16/2017   Lab Results  Component Value Date   CHOLHDL 3.6 08/16/2017   Lab  Results  Component Value Date   HGBA1C 5.9 (H) 08/16/2017       Assessment & Plan:   Problem List Items Addressed This Visit      Respiratory   Sinusitis   Relevant Orders   Ambulatory referral to ENT   Ambulatory referral to Psychology Pt with concern for sinus pressure causing headaches and  pain.  CT negative in ER.  Pt with dental extraction -no concerns per dentist related to extraction but onset of headache after procedure.       Other   Headache in back of head - Primary   Relevant Orders   Ambulatory referral to ENT   Ambulatory referral to Psychology CT in ER negative     3. Insomnia due to stress Pt with difficulty sleeping due to stress and anxiety. Concern for source of headache trigger.   4. Anxiety Pt agreed to psy referral to discuss stressors-concern for headaches and chest pain related to increase stress/anxiety.   LISA Hannah Beat, MD

## 2019-01-09 NOTE — Patient Instructions (Addendum)
flonase or nasanex-2 sprays to each nostril nightly then bush your teeth Nasal saline Tylenol 500mg  2 every 4-6 hours MAX of 6 in 24 hours ENT referral Counseling referral

## 2019-01-12 DIAGNOSIS — F419 Anxiety disorder, unspecified: Secondary | ICD-10-CM | POA: Insufficient documentation

## 2019-01-15 DIAGNOSIS — E785 Hyperlipidemia, unspecified: Secondary | ICD-10-CM | POA: Diagnosis not present

## 2019-01-15 DIAGNOSIS — R7301 Impaired fasting glucose: Secondary | ICD-10-CM | POA: Diagnosis not present

## 2019-01-15 DIAGNOSIS — E782 Mixed hyperlipidemia: Secondary | ICD-10-CM | POA: Diagnosis not present

## 2019-01-15 DIAGNOSIS — E039 Hypothyroidism, unspecified: Secondary | ICD-10-CM | POA: Diagnosis not present

## 2019-01-15 LAB — HEMOGLOBIN A1C
Hemoglobin A1C: 5.9
Hemoglobin A1C: 5.9

## 2019-01-15 LAB — LIPID PANEL
HDL: 57 (ref 35–70)
LDL Cholesterol: 85

## 2019-01-15 LAB — TSH: TSH: 0.52 (ref <=5.90)

## 2019-01-15 LAB — BASIC METABOLIC PANEL: Glucose: 115

## 2019-01-15 LAB — VITAMIN D 25 HYDROXY (VIT D DEFICIENCY, FRACTURES): Vit D, 25-Hydroxy: 33.9

## 2019-01-16 ENCOUNTER — Other Ambulatory Visit: Payer: Self-pay

## 2019-01-16 ENCOUNTER — Ambulatory Visit (INDEPENDENT_AMBULATORY_CARE_PROVIDER_SITE_OTHER): Payer: Medicare Other | Admitting: Family Medicine

## 2019-01-16 VITALS — BP 98/60 | HR 83 | Temp 97.7°F | Ht 66.0 in | Wt 195.8 lb

## 2019-01-16 DIAGNOSIS — F5102 Adjustment insomnia: Secondary | ICD-10-CM

## 2019-01-16 DIAGNOSIS — N898 Other specified noninflammatory disorders of vagina: Secondary | ICD-10-CM | POA: Diagnosis not present

## 2019-01-16 DIAGNOSIS — R309 Painful micturition, unspecified: Secondary | ICD-10-CM

## 2019-01-16 HISTORY — DX: Other specified noninflammatory disorders of vagina: N89.8

## 2019-01-16 LAB — POCT URINALYSIS DIP (CLINITEK)
Bilirubin, UA: NEGATIVE
Blood, UA: NEGATIVE
Glucose, UA: NEGATIVE mg/dL
Ketones, POC UA: NEGATIVE mg/dL
Leukocytes, UA: NEGATIVE
Nitrite, UA: NEGATIVE
POC PROTEIN,UA: NEGATIVE
Spec Grav, UA: 1.025 (ref 1.010–1.025)
Urobilinogen, UA: 0.2 E.U./dL
pH, UA: 6 (ref 5.0–8.0)

## 2019-01-16 MED ORDER — MONISTAT 1 COMBO PACK 1200 & 2 MG & % VA KIT: 1.0000 | PACK | Freq: Once | VAGINAL | 0 refills | Status: AC

## 2019-01-16 NOTE — Progress Notes (Signed)
Acute Office Visit  Subjective:    Patient ID: Kara [name], female    DOB: 08-03-1945, 73 y.o.   MRN: [id number]  Chief Complaint  Patient presents with  . Urinary Tract Infection   HPI Patient is in today for irritation with urination-external burning-urogenital area Continued difficulty with sleep-pt having trouble getting to sleep Headaches have improved  Past Medical History:  Diagnosis Date  . Allergy    food allergies, drug allergies  . Anemia   . Anxiety   . Cataract   . Diabetes mellitus without complication (HCC)    no medication   . GERD (gastroesophageal reflux disease)   . Hyperlipidemia   . Thyroid disease     Past Surgical History:  Procedure Laterality Date  . ABDOMINAL HYSTERECTOMY     bleeding. fibroids  . CESAREAN SECTION    . CHOLECYSTECTOMY    . COLONOSCOPY  2011   [place]: two 3-4 mm sessile polyps, hyperplastic, sigmoid diverticula, TI appeared normal.   . ESOPHAGOGASTRODUODENOSCOPY  2011   [place]: non-bleeding erosive gastropathy, normal duodenum. path: unremarkable duodenum, negative H.pylori, minimal esophagitis   . TONSILLECTOMY  1975    Family History  Problem Relation Age of Onset  . Cancer Maternal Grandmother        breast  . Breast cancer Maternal Grandmother   . Cancer Maternal Grandfather   . Cancer Paternal Grandmother   . Cancer Paternal Grandfather   . Breast cancer Paternal Grandfather   . Arthritis Mother   . Cancer Mother        breast  . COPD Mother   . Breast cancer Mother   . Cancer Father        throat  . Stroke Father   . Cancer Maternal Aunt [address] Lacks, cervical cancer  . Breast cancer Maternal Aunt   . Breast cancer Paternal Aunt   . Colon cancer Neg Hx     Social History   Socioeconomic History  . Marital status: Divorced    Spouse name: Not on file  . Number of children: 2  . Years of education: 38  . Highest education level: Not on file  Occupational History  . Occupation:  retired    Comment: Environmental health practitioner at [employer]  Social Needs  . Financial resource strain: Not hard at all  . Food insecurity    Worry: Never true    Inability: Never true  . Transportation needs    Medical: No    Non-medical: No  Tobacco Use  . Smoking status: Former Research scientist (life sciences)  . Smokeless tobacco: Never Used  . Tobacco comment: quit 2002  Substance and Sexual Activity  . Alcohol use: No  . Drug use: No  . Sexual activity: Not Currently    Birth control/protection: Surgical    Comment: hyst  Lifestyle  . Physical activity    Days per week: 0 days    Minutes per session: 0 min  . Stress: Rather much  Relationships  . Social connections    Talks on phone: More than three times a week    Gets together: More than three times a week    Attends religious service: 1 to 4 times per year    Active member of club or organization: Yes    Attends meetings of clubs or organizations: 1 to 4 times per year    Relationship status: Divorced  . Intimate partner violence    Fear of  current or ex partner: No    Emotionally abused: No    Physically abused: No    Forced sexual activity: No  Other Topics Concern  . 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 Alum Hydroxide-Mag Carbonate (GAVISCON PO) Take by mouth as needed.    . doxycycline (VIBRA-TABS) 100 MG tablet Take 1 tablet (100 mg total) by mouth 2 (two) times daily. 20 tablet 0  . EPINEPHrine 0.3 mg/0.3 mL IJ SOAJ injection Inject 0.3 mLs (0.3 mg total) into the skin as needed. 1 Device 1  . estradiol (VIVELLE-DOT) 0.0375 MG/24HR Place 1 patch onto the skin 2 (two) times a week.    . nystatin (MYCOSTATIN) 100000 UNIT/ML suspension 1 tsp swish and spit ac  meal and bedtime 60 mL 0  . omeprazole (PRILOSEC) 20 MG capsule Take 20 mg by mouth as needed.    Marland Kitchen SYNTHROID 112 MCG tablet TAKE 1 TABLET BY MOUTH ONCE DAILY (Patient taking differently: Take 100 mcg by mouth AC breakfast. ) 90 tablet 0   No facility-administered medications prior to visit.     Allergies  Allergen Reactions  . Iodine Anaphylaxis  . Other Anaphylaxis    Allergic to seafood and nuts  . Prednisone   . Ibuprofen Other (See Comments)    Uncertain - headache  . Naproxen Other (See Comments)    headache  . Penicillins Swelling    Review of Systems  Genitourinary: Positive for dysuria.       External pain with urination  Psychiatric/Behavioral: The patient has insomnia.        Objective:    Physical Exam  Constitutional: She appears well-developed and well-nourished.  Genitourinary:    Vaginal discharge present.   Skin: There is erythema.  vulva-cream color discharge-eryth , no lesions BP 98/60 (BP Location: Left Arm, Patient Position: Sitting, Cuff Size: Normal)   Pulse 83   Temp 97.7 F (36.5 C) (Oral)   Ht 5\' 6"  (1.676 m)   Wt 195 lb 12.8 oz (88.8 kg)   SpO2 98%   BMI 31.60 kg/m  Wt Readings from Last 3 Encounters:  01/16/19 195 lb 12.8 oz (88.8 kg)  01/09/19 196 lb 12.8 oz (89.3 kg)  12/31/18 200 lb 9.6 oz (91 kg)    Health Maintenance Due  Topic Date Due  . FOOT EXAM  07/19/1955  . PNA vac Low Risk Adult (2 of 2 - PPSV23) 07/05/2017  . HEMOGLOBIN A1C  02/15/2018  . OPHTHALMOLOGY EXAM  06/01/2018  . URINE MICROALBUMIN  08/17/2018  . INFLUENZA VACCINE  12/14/2018    Lab Results  Component Value Date   TSH 0.58 08/16/2017   Lab Results  Component Value Date   WBC 6.5 08/16/2017   HGB 13.4 08/16/2017   HCT 40.5 08/16/2017   MCV 78.8 (L) 08/16/2017   PLT 220 08/16/2017   Lab Results  Component Value Date   NA 139 08/16/2017   K 4.8 08/16/2017   CO2 29 08/16/2017   GLUCOSE 109 (H) 08/16/2017   BUN 14 08/16/2017   CREATININE 0.78  08/16/2017   BILITOT 0.5 08/16/2017   ALKPHOS 106 11/24/2016   AST 20 08/16/2017  ALT 20 08/16/2017   PROT 6.8 08/16/2017   ALBUMIN 3.7 11/24/2016   CALCIUM 9.4 08/16/2017   ANIONGAP 11 08/04/2017   Lab Results  Component Value Date   CHOL 203 (H) 08/16/2017   Lab Results  Component Value Date   HDL 56 08/16/2017   Lab Results  Component Value Date   LDLCALC 131 (H) 08/16/2017   Lab Results  Component Value Date   TRIG 64 08/16/2017   Lab Results  Component Value Date   CHOLHDL 3.6 08/16/2017   Lab Results  Component Value Date   HGBA1C 5.9 (H) 08/16/2017       Assessment & Plan:  1. Urination pain U/a normal - POCT URINALYSIS DIP (CLINITEK)  2. Vaginal discharge monostat-use external cream as a barrier - SureSwab, Vaginosis/Vaginitis Plus  3. Insomnia due to stress Melatonin-otc trial  Darielle Hancher Hannah Beat, MD

## 2019-01-19 ENCOUNTER — Encounter (HOSPITAL_COMMUNITY): Payer: Self-pay | Admitting: *Deleted

## 2019-01-19 ENCOUNTER — Other Ambulatory Visit: Payer: Self-pay

## 2019-01-19 ENCOUNTER — Emergency Department (HOSPITAL_COMMUNITY): Payer: Medicare Other

## 2019-01-19 ENCOUNTER — Emergency Department (HOSPITAL_COMMUNITY)
Admission: EM | Admit: 2019-01-19 | Discharge: 2019-01-19 | Disposition: A | Discharge status: Home / Self Care | Payer: Medicare Other | Attending: Emergency Medicine | Admitting: Emergency Medicine

## 2019-01-19 DIAGNOSIS — R51 Headache: Secondary | ICD-10-CM | POA: Insufficient documentation

## 2019-01-19 DIAGNOSIS — Z79899 Other long term (current) drug therapy: Secondary | ICD-10-CM | POA: Diagnosis not present

## 2019-01-19 DIAGNOSIS — Z87891 Personal history of nicotine dependence: Secondary | ICD-10-CM | POA: Insufficient documentation

## 2019-01-19 DIAGNOSIS — E119 Type 2 diabetes mellitus without complications: Secondary | ICD-10-CM | POA: Diagnosis not present

## 2019-01-19 DIAGNOSIS — R519 Headache, unspecified: Secondary | ICD-10-CM

## 2019-01-19 LAB — CBC WITH DIFFERENTIAL/PLATELET
Abs Immature Granulocytes: 0.04 10*3/uL (ref 0.00–0.07)
Basophils Absolute: 0.1 10*3/uL (ref 0.0–0.1)
Basophils Relative: 1 %
Eosinophils Absolute: 0.1 10*3/uL (ref 0.0–0.5)
Eosinophils Relative: 2 %
HCT: 43.2 % (ref 36.0–46.0)
Hemoglobin: 13.5 g/dL (ref 12.0–15.0)
Immature Granulocytes: 0 %
Lymphocytes Relative: 32 %
Lymphs Abs: 3 10*3/uL (ref 0.7–4.0)
MCH: 26.5 pg (ref 26.0–34.0)
MCHC: 31.3 g/dL (ref 30.0–36.0)
MCV: 84.7 fL (ref 80.0–100.0)
Monocytes Absolute: 0.9 10*3/uL (ref 0.1–1.0)
Monocytes Relative: 9 %
Neutro Abs: 5.3 10*3/uL (ref 1.7–7.7)
Neutrophils Relative %: 56 %
Platelets: 226 10*3/uL (ref 150–400)
RBC: 5.1 MIL/uL (ref 3.87–5.11)
RDW: 17.9 % — ABNORMAL HIGH (ref 11.5–15.5)
WBC: 9.4 10*3/uL (ref 4.0–10.5)
nRBC: 0 % (ref 0.0–0.2)

## 2019-01-19 LAB — COMPREHENSIVE METABOLIC PANEL
ALT: 34 U/L (ref 0–44)
AST: 28 U/L (ref 15–41)
Albumin: 3.5 g/dL (ref 3.5–5.0)
Alkaline Phosphatase: 95 U/L (ref 38–126)
Anion gap: 7 (ref 5–15)
BUN: 12 mg/dL (ref 8–23)
CO2: 26 mmol/L (ref 22–32)
Calcium: 9.1 mg/dL (ref 8.9–10.3)
Chloride: 105 mmol/L (ref 98–111)
Creatinine, Ser: 0.69 mg/dL (ref 0.44–1.00)
GFR calc Af Amer: >60 mL/min (ref >60)
GFR calc non Af Amer: >60 mL/min (ref >60)
Glucose, Bld: 122 mg/dL — ABNORMAL HIGH (ref 70–99)
Potassium: 3.8 mmol/L (ref 3.5–5.1)
Sodium: 138 mmol/L (ref 135–145)
Total Bilirubin: 0.4 mg/dL (ref 0.3–1.2)
Total Protein: 6.8 g/dL (ref 6.5–8.1)

## 2019-01-19 LAB — SEDIMENTATION RATE: Sed Rate: 12 mm/hr (ref 0–22)

## 2019-01-19 MED ORDER — METOCLOPRAMIDE HCL 10 MG PO TABS
5.0000 mg | ORAL_TABLET | Freq: Once | ORAL | Status: AC
  Administered 2019-01-19: 03:00:00 5 mg via ORAL
  Filled 2019-01-19: qty 1

## 2019-01-19 NOTE — Discharge Instructions (Signed)
Your testing is reassuring.  As we discussed, keep yourself hydrated.  Use Tylenol for the headache.  You may start a antihistamine such as Zyrtec or Claritin and use the nasal spray that your primary doctor provided.  Keep a headache diary and follow-up with the neurologist as well as the ENT doctor.  Return to the ED if you develop worsening headaches, unilateral weakness, difficulty speaking, difficulty swallowing or other concerns.

## 2019-01-19 NOTE — ED Provider Notes (Signed)
[facility] EMERGENCY DEPARTMENT Provider Note   CSN: [id number] Arrival date & time: 01/19/19  0144     History   Chief Complaint Chief Complaint  Patient presents with  . Headache    HPI Kara [name] is a 73 y.o. female.     Patient with history of anxiety, prediabetes, acid reflux disease, thyroid disease presenting with a right-sided headache that has been waxing and waning for the better part of 1 month.  Patient states she was told was due to a bad tooth of her right maxilla which she had pulled on August 21.  She has been on antibiotics for 2 days prior to this and was told by her dentist she did not need them anymore.  She reports pain to her right maxilla and right face on the right side of her head that feels like a pressure that relieves when her "sinuses released".  She has seen her PCP was prescribed Flonase which she stopped using after several days as she did not like the way dried out her chest.  She comes in tonight with persistent headache and not able to sleep despite using Sinex at home as well as a Nettie pot.  He has not been on any antibiotics or Flonase for the past several weeks.  She was prescribed doxycycline by her PCP for possible sinusitis in August 24 but stopped this after 2 days due to side effects.  She has seen her PCP and was referred to ENT doctor who she has not seen yet.  She had a CT scan in the ER on July 12 that was negative.  States she was having a headache prior to having a tooth pulled as well.  She reports her dentist told her everything was healing appropriately with her tooth.  She denies any significant sinus drainage, cough, fever.  No difficulty breathing or difficulty swallowing.  No chest pain or shortness of breath.  States she has not been blowing her nose much because is been congested.  The history is provided by the patient.  Headache Associated symptoms: sinus pressure   Associated symptoms: no abdominal pain, no cough, no  dizziness, no fatigue, no fever, no myalgias, no nausea, no sore throat, no vomiting and no weakness     Past Medical History:  Diagnosis Date  . Allergy    food allergies, drug allergies  . Anemia   . Anxiety   . Cataract   . Diabetes mellitus without complication (HCC)    no medication   . GERD (gastroesophageal reflux disease)   . Hyperlipidemia   . Thyroid disease     Patient Active Problem List   Diagnosis Date Noted  . Urination pain 01/16/2019  . Vaginal discharge 01/16/2019  . Anxiety 01/12/2019  . Sinusitis 01/06/2019  . Headache in back of head 12/31/2018  . Candida infection of mouth 12/31/2018  . Splinter in skin 12/23/2018  . Insomnia due to stress 12/23/2018  . H/O swallowed foreign body 11/20/2018  . Change in bowel function 04/09/2018  . Breast pain, left 02/25/2018  . Prediabetes 08/30/2017  . Dyspepsia 09/29/2016  . Colon polyps 08/23/2016  . Family history of cancer 08/23/2016  . HLD (hyperlipidemia) 08/23/2016  . Obesity, Class II, BMI 35-39.9, with comorbidity 08/23/2016  . GERD (gastroesophageal reflux disease) 08/14/2016  . Hypothyroid 08/14/2016  . Perimenopausal vasomotor symptoms 02/11/2016  . Postmenopausal atrophic vaginitis 11/09/2015    Past Surgical History:  Procedure Laterality Date  . ABDOMINAL HYSTERECTOMY  bleeding. fibroids  . CESAREAN SECTION    . CHOLECYSTECTOMY    . COLONOSCOPY  2011   [place]: two 3-4 mm sessile polyps, hyperplastic, sigmoid diverticula, TI appeared normal.   . ESOPHAGOGASTRODUODENOSCOPY  2011   [place]: non-bleeding erosive gastropathy, normal duodenum. path: unremarkable duodenum, negative H.pylori, minimal esophagitis   . TONSILLECTOMY  1975     OB History    Gravida  3   Para  2   Term      Preterm      AB  1   Living        SAB  1   TAB      Ectopic      Multiple      Live Births               Home Medications    Prior to Admission medications   Medication Sig  Start Date End Date Taking? Authorizing Provider  Alum Hydroxide-Mag Carbonate (GAVISCON PO) Take by mouth as needed.    [provider]  doxycycline (VIBRA-TABS) 100 MG tablet Take 1 tablet (100 mg total) by mouth 2 (two) times daily. 01/06/19   Corum, Rex Kras, MD  EPINEPHrine 0.3 mg/0.3 mL IJ SOAJ injection Inject 0.3 mLs (0.3 mg total) into the skin as needed. 11/28/16   Raylene Everts, MD  estradiol (VIVELLE-DOT) 0.0375 MG/24HR Place 1 patch onto the skin 2 (two) times a week.    [provider]  nystatin (MYCOSTATIN) 100000 UNIT/ML suspension 1 tsp swish and spit ac meal and bedtime 12/31/18   Corum, Rex Kras, MD  omeprazole (PRILOSEC) 20 MG capsule Take 20 mg by mouth as needed.    [provider]  SYNTHROID 112 MCG tablet TAKE 1 TABLET BY MOUTH ONCE DAILY Patient taking differently: Take 100 mcg by mouth AC breakfast.  10/16/17   Caren Macadam, MD    Family History Family History  Problem Relation Age of Onset  . Cancer Maternal Grandmother        breast  . Breast cancer Maternal Grandmother   . Cancer Maternal Grandfather   . Cancer Paternal Grandmother   . Cancer Paternal Grandfather   . Breast cancer Paternal Grandfather   . Arthritis Mother   . Cancer Mother        breast  . COPD Mother   . Breast cancer Mother   . Cancer Father        throat  . Stroke Father   . Cancer Maternal Aunt [address] Lacks, cervical cancer  . Breast cancer Maternal Aunt   . Breast cancer Paternal Aunt   . Colon cancer Neg Hx     Social History Social History   Tobacco Use  . Smoking status: Former Research scientist (life sciences)  . Smokeless tobacco: Never Used  . Tobacco comment: quit 2002  Substance Use Topics  . Alcohol use: No  . Drug use: No     Allergies   Iodine, Other, Prednisone, Ibuprofen, Naproxen, and Penicillins   Review of Systems Review of Systems  Constitutional: Negative for activity change, appetite change, fatigue and fever.  HENT: Positive for  dental problem, sinus pressure and sinus pain. Negative for rhinorrhea, sore throat and trouble swallowing.   Eyes: Negative for visual disturbance.  Respiratory: Negative for cough, chest tightness and shortness of breath.   Cardiovascular: Negative for chest pain.  Gastrointestinal: Negative for abdominal pain, nausea and vomiting.  Genitourinary: Negative for dysuria and urgency.  Musculoskeletal: Negative for arthralgias and myalgias.  Skin: Negative for rash.  Neurological: Positive for headaches. Negative for dizziness and weakness.    all other systems are negative except as noted in the HPI and PMH.    Physical Exam Updated Vital Signs BP 117/80   Pulse (!) 105   Temp 97.8 F (36.6 C) (Oral)   Resp 16   Ht 5' 6" (1.676 m)   Wt 88.8 kg   SpO2 100%   BMI 31.60 kg/m   Physical Exam Vitals signs and nursing note reviewed.  Constitutional:      General: She is not in acute distress.    Appearance: Normal appearance. She is well-developed and normal weight. She is not toxic-appearing.     Comments: Anxious appearing  HENT:     Head: Normocephalic and atraumatic.     Comments: No temporal artery tenderness.  No significant frontal or maxillary sinus tenderness    Right Ear: Tympanic membrane normal.     Left Ear: Tympanic membrane normal.     Nose: No congestion.     Mouth/Throat:     Mouth: Mucous membranes are moist.     Pharynx: No oropharyngeal exudate.  Eyes:     Conjunctiva/sclera: Conjunctivae normal.     Pupils: Pupils are equal, round, and reactive to light.  Neck:     Musculoskeletal: Normal range of motion and neck supple.     Comments: No meningismus. Cardiovascular:     Rate and Rhythm: Normal rate and regular rhythm.     Heart sounds: Normal heart sounds. No murmur.  Pulmonary:     Effort: Pulmonary effort is normal. No respiratory distress.     Breath sounds: Normal breath sounds.  Chest:     Chest wall: No tenderness.  Abdominal:      Palpations: Abdomen is soft.     Tenderness: There is no abdominal tenderness. There is no guarding or rebound.  Musculoskeletal: Normal range of motion.        General: No tenderness.  Skin:    General: Skin is warm.     Capillary Refill: Capillary refill takes less than 2 seconds.  Neurological:     General: No focal deficit present.     Mental Status: She is alert and oriented to person, place, and time. Mental status is at baseline.     Cranial Nerves: No cranial nerve deficit.     Motor: No abnormal muscle tone.     Coordination: Coordination normal.     Comments: No ataxia on finger to nose bilaterally. No pronator drift. 5/5 strength throughout. CN 2-12 intact.Equal grip strength. Sensation intact.   Psychiatric:        Behavior: Behavior normal.      ED Treatments / Results  Labs (all labs ordered are listed, but only abnormal results are displayed) Labs Reviewed  CBC WITH DIFFERENTIAL/PLATELET - Abnormal; Notable for the following components:      Result Value   RDW 17.9 (*)    All other components within normal limits  COMPREHENSIVE METABOLIC PANEL - Abnormal; Notable for the following components:   Glucose, Bld 122 (*)    All other components within normal limits  SEDIMENTATION RATE    EKG None  Radiology Ct Head Wo Contrast  Result Date: 01/19/2019 CLINICAL DATA:  Headache. Right facial pain following tooth removed 2 weeks ago EXAM: CT HEAD WITHOUT CONTRAST CT MAXILLOFACIAL WITHOUT CONTRAST TECHNIQUE: Multidetector CT imaging of the head and maxillofacial structures were performed  using the standard protocol without intravenous contrast. Multiplanar CT image reconstructions of the maxillofacial structures were also generated. COMPARISON:  11/24/2018 FINDINGS: CT HEAD FINDINGS Brain: No acute intracranial abnormality. Specifically, no hemorrhage, hydrocephalus, mass lesion, acute infarction, or significant intracranial injury. Vascular: No hyperdense vessel or  unexpected calcification. Skull: No acute calvarial abnormality. Other: None CT MAXILLOFACIAL FINDINGS Osseous: No fracture or mandibular dislocation. No destructive process. Orbits: Negative. No traumatic or inflammatory finding. Sinuses: Mucosal thickening in the right maxillary sinus and scattered ethmoid air cells. No air-fluid levels. Mastoids are clear. Soft tissues: Negative. No focal fluid collection to suggest abscess. IMPRESSION: No acute intracranial abnormality. No acute bony or soft tissue abnormality in the face. Electronically Signed   By: Rolm Baptise M.D.   On: 01/19/2019 03:59   Ct Maxillofacial Wo Contrast  Result Date: 01/19/2019 CLINICAL DATA:  Headache. Right facial pain following tooth removed 2 weeks ago EXAM: CT HEAD WITHOUT CONTRAST CT MAXILLOFACIAL WITHOUT CONTRAST TECHNIQUE: Multidetector CT imaging of the head and maxillofacial structures were performed using the standard protocol without intravenous contrast. Multiplanar CT image reconstructions of the maxillofacial structures were also generated. COMPARISON:  11/24/2018 FINDINGS: CT HEAD FINDINGS Brain: No acute intracranial abnormality. Specifically, no hemorrhage, hydrocephalus, mass lesion, acute infarction, or significant intracranial injury. Vascular: No hyperdense vessel or unexpected calcification. Skull: No acute calvarial abnormality. Other: None CT MAXILLOFACIAL FINDINGS Osseous: No fracture or mandibular dislocation. No destructive process. Orbits: Negative. No traumatic or inflammatory finding. Sinuses: Mucosal thickening in the right maxillary sinus and scattered ethmoid air cells. No air-fluid levels. Mastoids are clear. Soft tissues: Negative. No focal fluid collection to suggest abscess. IMPRESSION: No acute intracranial abnormality. No acute bony or soft tissue abnormality in the face. Electronically Signed   By: Rolm Baptise M.D.   On: 01/19/2019 03:59    Procedures Procedures (including critical care time)   Medications Ordered in ED Medications  metoCLOPramide (REGLAN) tablet 5 mg (has no administration in time range)     Initial Impression / Assessment and Plan / ED Course  I have reviewed the triage vital signs and the nursing notes.  Pertinent labs & imaging results that were available during my care of the patient were reviewed by me and considered in my medical decision making (see chart for details).       1 month of headache that persists.  She has been seen by the ED as well as her PCP at her dentist without definite diagnosis.  She has been prescribed Flonase and antibiotics which she did not like the side effects of.  She was referred to ENT but has not gone yet.  She denies acutely worsening of her headache tonight but is frustrated with not being able to sleep.  There is no focal neurological deficit.  There is no fever.  Low suspicion for subarachnoid hemorrhage, meningitis, temporal arteritis.   ESR is normal.  Low suspicion for temporal arteritis.  Her neurological exam is nonfocal.  CT scan from July 12 was negative.  He does agree to CT scan today for further evaluation of her sinuses.  This was done which shows no acute sinus inflammation.  Discussed with patient.  Discussed she likely does not need any antibiotics or steroids.  Agree with her PCP that she should follow-up with ENT as well as neurology for further evaluation of her headache.  Advised increase hydration at home, analgesics, may start antihistamine such as Claritin or Zyrtec. Neurology follow-up given as well as ENT follow-up.  Her blood pressure remains normal.  Advised patient to keep headache diary so that her neurologist may have a better idea of the etiology of her headaches. Recommended good sleep hygiene, adequate hydration and limit caffeine. Check her carbon monoxide detector that she has at home.   Followup with ENT and neurology. Return precautions discussed.     Final Clinical  Impressions(s) / ED Diagnoses   Final diagnoses:  Headache, unspecified headache type    ED Discharge Orders    None       Zareena Willis, Annie Main, MD 01/19/19 [phone]

## 2019-01-19 NOTE — ED Triage Notes (Signed)
Pt c/o headache that started two weeks ago after having a tooth removed, pt reports that she did use a nedipod and sinus nasal spray tonight with improvement of her sinus pressure,

## 2019-01-21 ENCOUNTER — Other Ambulatory Visit: Payer: Self-pay | Admitting: Family Medicine

## 2019-01-21 DIAGNOSIS — R519 Headache, unspecified: Secondary | ICD-10-CM

## 2019-01-22 ENCOUNTER — Ambulatory Visit (INDEPENDENT_AMBULATORY_CARE_PROVIDER_SITE_OTHER): Payer: Medicare Other | Admitting: Family Medicine

## 2019-01-22 ENCOUNTER — Other Ambulatory Visit: Payer: Self-pay

## 2019-01-22 VITALS — BP 110/62 | HR 82 | Temp 97.9°F | Ht 66.0 in | Wt 197.6 lb

## 2019-01-22 DIAGNOSIS — R51 Headache: Secondary | ICD-10-CM

## 2019-01-22 DIAGNOSIS — R519 Headache, unspecified: Secondary | ICD-10-CM

## 2019-01-22 DIAGNOSIS — J329 Chronic sinusitis, unspecified: Secondary | ICD-10-CM

## 2019-01-22 MED ORDER — CEPHALEXIN 500 MG PO CAPS: 500.0000 mg | ORAL_CAPSULE | Freq: Two times a day (BID) | ORAL | 0 refills | Status: DC

## 2019-01-22 NOTE — Patient Instructions (Addendum)
Trial of keflex -CAN CROSS REACT WITH PENICILLIN-stop if any signs of allergy response  flonase trial at night-brush teeth   Continue nasal saline wash  Allergy referral

## 2019-01-22 NOTE — Progress Notes (Signed)
Acute Office Visit  Subjective:    Patient ID: Kara [name], female    DOB: 1945-08-29, 73 y.o.   MRN: [id number]  Chief Complaint  Patient presents with  . Labs Only    f/u    HPI Patient is in today for headaches-pt would like to find out exactly what she is causing her allergic responses-pt with maxillary thickening on CT and wants medication for treatment NO-side effects when given Keflex. Pt states she would like a allergy referral. Reviewed ER note with recent CT scan results and lab results  Past Medical History:  Diagnosis Date  . Allergy    food allergies, drug allergies  . Anemia   . Anxiety   . Cataract   . Diabetes mellitus without complication (HCC)    no medication   . GERD (gastroesophageal reflux disease)   . Hyperlipidemia   . Thyroid disease     Past Surgical History:  Procedure Laterality Date  . ABDOMINAL HYSTERECTOMY     bleeding. fibroids  . CESAREAN SECTION    . CHOLECYSTECTOMY    . COLONOSCOPY  2011   [place]: two 3-4 mm sessile polyps, hyperplastic, sigmoid diverticula, TI appeared normal.   . ESOPHAGOGASTRODUODENOSCOPY  2011   [place]: non-bleeding erosive gastropathy, normal duodenum. path: unremarkable duodenum, negative H.pylori, minimal esophagitis   . TONSILLECTOMY  1975    Family History  Problem Relation Age of Onset  . Cancer Maternal Grandmother        breast  . Breast cancer Maternal Grandmother   . Cancer Maternal Grandfather   . Cancer Paternal Grandmother   . Cancer Paternal Grandfather   . Breast cancer Paternal Grandfather   . Arthritis Mother   . Cancer Mother        breast  . COPD Mother   . Breast cancer Mother   . Cancer Father        throat  . Stroke Father   . Cancer Maternal Aunt [address] [place], cervical cancer  . Breast cancer Maternal Aunt   . Breast cancer Paternal Aunt   . Colon cancer Neg Hx     Social History   Socioeconomic History  . Marital status: Divorced    Spouse name:  Not on file  . Number of children: 2  . Years of education: 60  . Highest education level: Not on file  Occupational History  . 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 Financial resource strain: Not hard at all  . Food insecurity    Worry: Never true    Inability: Never true  . Transportation needs    Medical: No    Non-medical: No  Tobacco Use  . Smoking status: Former Research scientist (life sciences)  . Smokeless tobacco: Never Used  . Tobacco comment: quit 2002  Substance and Sexual Activity  . Alcohol use: No  . Drug use: No  . Sexual activity: Not Currently    Birth control/protection: Surgical    Comment: hyst  Lifestyle  . Physical activity    Days per week: 0 days    Minutes per session: 0 min  . Stress: Rather much  Relationships  . Social connections    Talks on phone: More than three times a week    Gets together: More than three times a week    Attends religious service: 1 to 4 times per year    Active member of  club or organization: Yes    Attends meetings of clubs or organizations: 1 to 4 times per year    Relationship status: Divorced  . Intimate partner violence    Fear of current or ex partner: No    Emotionally abused: No    Physically abused: No    Forced sexual activity: No  Other Topics Concern  . 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 Alum Hydroxide-Mag Carbonate (GAVISCON PO) Take by mouth as needed.    . doxycycline (VIBRA-TABS) 100 MG tablet Take 1 tablet (100 mg total) by mouth 2 (two) times daily. 20 tablet 0  . EPINEPHrine 0.3 mg/0.3 mL IJ SOAJ injection Inject 0.3 mLs (0.3 mg total) into the skin as needed. 1 Device 1  . estradiol (VIVELLE-DOT)  0.0375 MG/24HR Place 1 patch onto the skin 2 (two) times a week.    . nystatin (MYCOSTATIN) 100000 UNIT/ML suspension 1 tsp swish and spit ac meal and bedtime 60 mL 0  . omeprazole (PRILOSEC) 20 MG capsule Take 20 mg by mouth as needed.    Marland Kitchen SYNTHROID 112 MCG tablet TAKE 1 TABLET BY MOUTH ONCE DAILY (Patient taking differently: Take 100 mcg by mouth AC breakfast. ) 90 tablet 0   No facility-administered medications prior to visit.     Allergies  Allergen Reactions  . Iodine Anaphylaxis  . Other Anaphylaxis    Allergic to seafood and nuts  . Prednisone   . Ibuprofen Other (See Comments)    Uncertain - headache  . Naproxen Other (See Comments)    headache  . Penicillins Swelling    Review of Systems  Neurological: Positive for headaches.       No headache today  Endo/Heme/Allergies: Positive for environmental allergies.       Objective:    Physical Exam  Constitutional: She appears well-developed and well-nourished.  HENT:  Head: Normocephalic and atraumatic.  Cardiovascular: Normal rate and regular rhythm.  Pulmonary/Chest: Effort normal and breath sounds normal.  Psychiatric: She has a normal mood and affect.    BP 110/62 (BP Location: Left Arm, Patient Position: Sitting, Cuff Size: Normal)   Pulse 82   Temp 97.9 F (36.6 C) (Oral)   Ht 5\' 6"  (1.676 m)   Wt 197 lb 9.6 oz (89.6 kg)   SpO2 99%   BMI 31.89 kg/m  Wt Readings from Last 3 Encounters:  01/22/19 197 lb 9.6 oz (89.6 kg)  01/19/19 195 lb 12.3 oz (88.8 kg)  01/16/19 195 lb 12.8 oz (88.8 kg)    Health Maintenance Due  Topic Date Due  . FOOT EXAM  07/19/1957  . PNA vac Low Risk Adult (2 of 2 - PPSV23) 07/05/2017  . OPHTHALMOLOGY EXAM  06/01/2018  . URINE MICROALBUMIN  08/17/2018  . INFLUENZA VACCINE  12/14/2018    Lab Results  Component Value Date   TSH 0.58 08/16/2017   Lab Results  Component Value Date   WBC 9.4 01/19/2019   HGB 13.5 01/19/2019   HCT 43.2 01/19/2019   MCV 84.7 01/19/2019    PLT 226 01/19/2019   Lab Results  Component Value Date   NA 138 01/19/2019  K 3.8 01/19/2019   CO2 26 01/19/2019   GLUCOSE 122 (H) 01/19/2019   BUN 12 01/19/2019   CREATININE 0.69 01/19/2019   BILITOT 0.4 01/19/2019   ALKPHOS 95 01/19/2019   AST 28 01/19/2019   ALT 34 01/19/2019   PROT 6.8 01/19/2019   ALBUMIN 3.5 01/19/2019   CALCIUM 9.1 01/19/2019   ANIONGAP 7 01/19/2019   Lab Results  Component Value Date   CHOL 203 (H) 08/16/2017   Lab Results  Component Value Date   HDL 56 08/16/2017   Lab Results  Component Value Date   LDLCALC 131 (H) 08/16/2017   Lab Results  Component Value Date   TRIG 64 08/16/2017   Lab Results  Component Value Date   CHOLHDL 3.6 08/16/2017   Lab Results  Component Value Date   HGBA1C 5.9 01/15/2019       Assessment & Plan:   1. Sinusitis, unspecified chronicity, unspecified location Pt took keflex in August with NO SIDE EFFECTS-pt request additional treatment with keflex since medication helped symptoms but did not completely resolved. Pt request to see allergist for allergy evaluation to see what was triggering her symptoms. Pt will try flonase, nasal saline/washes + keflex and keep appt with allergiest - Ambulatory referral to Allergy  2. Headache in back of head - Ambulatory referral to Allergy   Hadrian Yarbrough Hannah Beat, MD

## 2019-01-23 ENCOUNTER — Telehealth: Payer: Self-pay | Admitting: Family Medicine

## 2019-01-23 DIAGNOSIS — H01111 Allergic dermatitis of right upper eyelid: Secondary | ICD-10-CM | POA: Diagnosis not present

## 2019-01-23 DIAGNOSIS — H04123 Dry eye syndrome of bilateral lacrimal glands: Secondary | ICD-10-CM | POA: Diagnosis not present

## 2019-01-23 DIAGNOSIS — H0102A Squamous blepharitis right eye, upper and lower eyelids: Secondary | ICD-10-CM | POA: Diagnosis not present

## 2019-01-23 DIAGNOSIS — H0102B Squamous blepharitis left eye, upper and lower eyelids: Secondary | ICD-10-CM | POA: Diagnosis not present

## 2019-01-23 LAB — SURESWAB, VAGINOSIS/VAGINITIS PLUS
Atopobium vaginae: NOT DETECTED Log cells/mL
C. albicans, DNA: NOT DETECTED
C. glabrata, DNA: NOT DETECTED
C. parapsilosis, DNA: NOT DETECTED
C. trachomatis RNA, TMA: NOT DETECTED
C. tropicalis, DNA: NOT DETECTED
Gardnerella vaginalis: NOT DETECTED Log cells/mL
LACTOBACILLUS SPECIES: NOT DETECTED Log cells/mL
MEGASPHAERA SPECIES: NOT DETECTED Log cells/mL
N. gonorrhoeae RNA, TMA: NOT DETECTED
Trichomonas vaginalis RNA: NOT DETECTED

## 2019-01-23 NOTE — Telephone Encounter (Signed)
Patient is calling and states [facility] sent her lab results from 01/15/19 her thyroid was 1.96 and on 07/22/18 her thyroid was .160.   She wants to know what the normal range is.

## 2019-01-23 NOTE — Telephone Encounter (Signed)
Patient is aware of the results.

## 2019-01-23 NOTE — Telephone Encounter (Signed)
She is aware of the Thyroid level range and she is asking Dr Holly Bodily about her Vitamin D level being 33.9 should she be medicated for that, please advise?

## 2019-01-23 NOTE — Telephone Encounter (Signed)
Normal thyroid .4-4.0 Normal Vit D 30+

## 2019-01-28 ENCOUNTER — Telehealth: Payer: Self-pay | Admitting: Family Medicine

## 2019-01-28 NOTE — Telephone Encounter (Signed)
Pt called back and said something that will get rid of her headaches.

## 2019-01-28 NOTE — Telephone Encounter (Signed)
Patient is scheduled to see Allergy & Asthma on 02/06/19 that's the soonest

## 2019-01-28 NOTE — Telephone Encounter (Signed)
Keep appt with allergist Taking antihistamines will dry drainage Suggest mucinex -would not use D  Start flonanse 2 sprays to each nostril daily Use saline spray and irrigation

## 2019-01-28 NOTE — Telephone Encounter (Signed)
Patient called back in, went ahead and informed her of Dr. Holly Bodily message. She said she has tried Claritin and Mucinex, neither work. She is using the Flonase. Also states her appt with allergist is not until 9/29 and needs relief before then.

## 2019-01-28 NOTE — Telephone Encounter (Signed)
Please call to see if allergist has a cancellation-pt with ongoing concerns about allergy trigger causing headaches and nasal congestion-trial of antibiotics, mucinex and flonase with little relief.  Pt can follow up here for additional exam-CT noted mucosal thickening when pt seen in ER.

## 2019-01-28 NOTE — Telephone Encounter (Signed)
Keep appt with allergist next week Claritin will dry up drainage

## 2019-01-28 NOTE — Telephone Encounter (Signed)
Patient is calling for recommendations for sinuses. She said she has a very stuffy nose. She needs something that will not make her jittery. She has already tried Claritin D.

## 2019-01-28 NOTE — Telephone Encounter (Signed)
Routing to Dr. Corum for Advice? 

## 2019-01-31 ENCOUNTER — Telehealth: Payer: Self-pay

## 2019-01-31 DIAGNOSIS — R350 Frequency of micturition: Secondary | ICD-10-CM | POA: Diagnosis not present

## 2019-01-31 DIAGNOSIS — R3129 Other microscopic hematuria: Secondary | ICD-10-CM | POA: Diagnosis not present

## 2019-01-31 DIAGNOSIS — N760 Acute vaginitis: Secondary | ICD-10-CM | POA: Diagnosis not present

## 2019-01-31 NOTE — Telephone Encounter (Signed)
Patient called advising that when she urinates and wipes she is seeing blood, she was asking to come in to see Dr Holly Bodily today I explained to her that she would need to go to [facility] to be treated beings Dr Holly Bodily would not be in the office Until Monday 02/10/19 and she understood the recommendation at the time

## 2019-02-04 ENCOUNTER — Telehealth: Payer: Self-pay | Admitting: *Deleted

## 2019-02-04 ENCOUNTER — Telehealth: Payer: Self-pay | Admitting: Obstetrics & Gynecology

## 2019-02-04 MED ORDER — TERCONAZOLE 0.4 % VA CREA: 1.0000 | TOPICAL_CREAM | Freq: Every day | VAGINAL | 0 refills | Status: DC

## 2019-02-04 NOTE — Telephone Encounter (Signed)
Pt states that she has been on antibiotics for 2 weeks and now has a yeast infection. Would like to either come in for test or have rx sent in.

## 2019-02-04 NOTE — Telephone Encounter (Signed)
Script sent in

## 2019-02-05 NOTE — Telephone Encounter (Signed)
Called patient back. She picked up cream from pharmacy. Advised that she can use it nightly for 7 days. If not better after that she will need to be seen in the office.

## 2019-02-06 ENCOUNTER — Encounter: Payer: Self-pay | Admitting: Allergy & Immunology

## 2019-02-06 ENCOUNTER — Ambulatory Visit (INDEPENDENT_AMBULATORY_CARE_PROVIDER_SITE_OTHER): Payer: Medicare Other | Admitting: Allergy & Immunology

## 2019-02-06 ENCOUNTER — Other Ambulatory Visit: Payer: Self-pay

## 2019-02-06 VITALS — BP 120/72 | HR 66 | Temp 97.2°F | Resp 16 | Ht 66.0 in | Wt 193.8 lb

## 2019-02-06 DIAGNOSIS — T7800XD Anaphylactic reaction due to unspecified food, subsequent encounter: Secondary | ICD-10-CM | POA: Diagnosis not present

## 2019-02-06 DIAGNOSIS — J310 Chronic rhinitis: Secondary | ICD-10-CM

## 2019-02-06 DIAGNOSIS — Z889 Allergy status to unspecified drugs, medicaments and biological substances status: Secondary | ICD-10-CM | POA: Diagnosis not present

## 2019-02-06 MED ORDER — BUDESONIDE 0.5 MG/2ML IN SUSP: RESPIRATORY_TRACT | 5 refills | Status: DC

## 2019-02-06 NOTE — Patient Instructions (Addendum)
1. Anaphylactic shock due to food, subsequent encounter - We are going to get some labs work to look for nut and seafood allergies. - We will call you in 1-2 weeks with the results of the testing. - EpiPen training reviewed. - Anaphylaxis management plan provided.  2. Chronic rhinitis - We will get some labs to look for environmental allergies. - We will call you in 1-2 weeks with the results of the testing. - Stop taking: fluticasone - Start taking: Allegra (fexofenadine) 180mg  table once daily + budesonide nasal saline rinses ONCE daily (see recipe below) - I know you are sensitive to new medications, but please give this a try to help control inflammation within the nasal cavity. - The nasal rinse will also help with decreasing mucous production.   3. Drug allergy - We can do testing to penicillin in the future. - This is the only drug for which we have validated testing. - Once the testing is negative, we will do a challenge in the office setting to make sure that you tolerate this.  - We can then address the other drug allergies once we have penicillin out of the way. - THere is no other testing for drugs at this time.   3. Return in about 2 months (around 04/08/2019), or PENICILLIN TESTING. This can be an in-person, a virtual Webex or a telephone follow up visit.   Please inform us of any Emergency Department visits, hospitalizations, or changes in symptoms. Call us before going to the ED for breathing or allergy symptoms since we might be able to fit you in for a sick visit. Feel free to contact us anytime with any questions, problems, or concerns.  It was a pleasure to meet you today!  Websites that have reliable patient information: 1. American Academy of Asthma, Allergy, and Immunology: www.aaaai.org 2. Food Allergy Research and Education (FARE): foodallergy.org 3. Mothers of Asthmatics: http://www.asthmacommunitynetwork.org 4. American College of Allergy, Asthma, and  Immunology: www.acaai.org  Like [place] on [place] and Instagram for our latest updates!      Make sure you are registered to vote! If you have moved or changed any of your contact information, you will need to get this updated before voting!  In some cases, you MAY be able to register to vote online: [email or web address]    Voter ID laws are NOT going into effect for the General Election in November 2020! DO NOT let this stop you from exercising your right to vote!   Absentee voting is the SAFEST way to vote during the coronavirus pandemic!   Download and print an absentee ballot request form at [email or web address] or you can scan the QR code below with your smart phone:      More information on absentee ballots can be found here: [email or web address]   Budesonide (Pulmicort) + Saline Irrigation/Rinse   Budesonide (Pulmicort) is an anti-inflammatory steroid medication used to decrease nasal and sinus inflammation. It is dispensed in liquid form in a vial. Although it is manufactured for use with a nebulizer, we intend for you to use it with the NeilMed Sinus Rinse bottle (preferred) or a Neti pot.    Instructions:  1) Make 240cc of saline in the NeilMed bottle using the salt packets or your own saline recipe (see separate handout).  2) Add the entire 2cc vial of liquid Budesonide (Pulmicort) to the rinse bottle and mix together.  3) While in the shower or over the sink, tilt your head forward to a comfortable level. Put  the tip of the sinus rinse bottle in your nostril and aim it towards the crown or top of your head. Gently squeeze the bottle to flush out your nose. The fluid will circulate in and out of your sinus cavities, coming back out from either nostril or through your mouth. Try not to swallow large quantities and spit it out instead.  4) Perform Budesonide (Pulmicort) + Saline irrigations 2 times daily.  \

## 2019-02-06 NOTE — Progress Notes (Addendum)
NEW PATIENT  Date of Service/Encounter:  02/06/19  Referring provider: Maryruth Hancock, MD   Assessment:   Anaphylactic shock due to food  Chronic rhinitis   Drug allergy (prednisone, penicillin, NSAIDs) - mostly intolerances  Plan/Recommendations:   1. Anaphylactic shock due to food - We are going to get some labs work to look for nut and seafood allergies. - We will call you in 1-2 weeks with the results of the testing. - EpiPen training reviewed. - Anaphylaxis management plan provided.  2. Chronic rhinitis - We will get some labs to look for environmental allergies. - We will call you in 1-2 weeks with the results of the testing. - Stop taking: fluticasone - Start taking: Allegra (fexofenadine) 180mg  table once daily + budesonide nasal saline rinses ONCE daily (see recipe below) - I know you are sensitive to new medications, but please give this a try to help control inflammation within the nasal cavity. - The nasal rinse will also help with decreasing mucous production.   3. Drug allergy - We can do testing to penicillin in the future. - This is the only drug for which we have validated testing. - Once the testing is negative, we will do a challenge in the office setting to make sure that you tolerate this.  - We can then address the other drug allergies once we have penicillin out of the way. - THere is no other testing for drugs at this time.   3. Return in about 2 months (around 04/08/2019), or PENICILLIN TESTING. This can be an in-person, a virtual Webex or a telephone follow up visit.  Subjective:   Kara [name] is a 73 y.o. female presenting today for evaluation of  Chief Complaint  Patient presents with  . Allergic Rhinitis     eyes swollen, headaches, stuffy nose, laying flat causes headaches  . medication allergies    penicillin, ibuprofen, iodine, prednisone, naproxen,  . Food Intolerance    peanuts, tree nuts, fish and shellfish (throat closes)     Kara [name] has a history of the following: Patient Active Problem List   Diagnosis Date Noted  . Urination pain 01/16/2019  . Vaginal discharge 01/16/2019  . Anxiety 01/12/2019  . Sinusitis 01/06/2019  . Headache in back of head 12/31/2018  . Candida infection of mouth 12/31/2018  . Splinter in skin 12/23/2018  . Insomnia due to stress 12/23/2018  . H/O swallowed foreign body 11/20/2018  . Change in bowel function 04/09/2018  . Breast pain, left 02/25/2018  . Prediabetes 08/30/2017  . Dyspepsia 09/29/2016  . Colon polyps 08/23/2016  . Family history of cancer 08/23/2016  . HLD (hyperlipidemia) 08/23/2016  . Obesity, Class II, BMI 35-39.9, with comorbidity 08/23/2016  . GERD (gastroesophageal reflux disease) 08/14/2016  . Hypothyroid 08/14/2016  . Perimenopausal vasomotor symptoms 02/11/2016  . 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  Kara [name] was referred by Maryruth Hancock, MD.     Kara [name] is a 73 y.o. female presenting for an evaluation of a multitude of complaints.    Allergic Rhinitis Symptom History: She has a history of chronic rhinosinusitis. She has tried Claritin-D. She has also been on fluticasone nasal spray. She has eye swelling and swollen nose. She has been on antibiotics for two weeks. Symptoms really got worse in July. She had a tooth pulled that was "close to the sinuses". She had the tooth pulled and the sinus issues are still there.  She has a stuffy nose and a pain "in my head". She did take clindamycin for ten days. She completed this and she tells me that she cannot "lay down in bed without developing nasal congestion". Prior to July, she was not having any issues. Now she has eye irritation and puffiness when she goes outdoors. She has been off of fluticasone for one week now without any worsening of her symptoms. She was placed on Mucinex and reported that she was "dried out" and developed  "chest pain". She has seen an allergist around 10 or 15 years ago. This was in the late 1990s in [place]. She was never on shots at all. She reports that she never really had any problems with allergies prior to two months ago.   She did have a sinus CT earlier this month when she went to the ED For this headache. It showed no acute intracranial abnormality and no acute bony or soft tissue abnormality of the face. She has never had migraines or headaches at all.   Food Allergy Symptom History: She is allergic to peanuts, tree nuts, fish, and shellfish. She reports throat closure with these. She is avoiding all of these things because of the crab and pistachio episodes. She does carry an EpiPen.   Drug Allergy Symptom History: She has elevated blood pressure with prednisone and ibuprofen. She as a label of a penicillin allergy. She does not remember the reaction or when it happened. She would like to start taking care of her drug allergy labels.  Otherwise, there is no history of other atopic diseases, including asthma, stinging insect allergies, eczema, urticaria or contact dermatitis. There is no significant infectious history. Vaccinations are up to date.    Past Medical History: Patient Active Problem List   Diagnosis Date Noted  . Urination pain 01/16/2019  . Vaginal discharge 01/16/2019  . Anxiety 01/12/2019  . Sinusitis 01/06/2019  . Headache in back of head 12/31/2018  . Candida infection of mouth 12/31/2018  . Splinter in skin 12/23/2018  . Insomnia due to stress 12/23/2018  . H/O swallowed foreign body 11/20/2018  . Change in bowel function 04/09/2018  . Breast pain, left 02/25/2018  . Prediabetes 08/30/2017  . Dyspepsia 09/29/2016  . Colon polyps 08/23/2016  . Family history of cancer 08/23/2016  . HLD (hyperlipidemia) 08/23/2016  . Obesity, Class II, BMI 35-39.9, with comorbidity 08/23/2016  . GERD (gastroesophageal reflux disease) 08/14/2016  . Hypothyroid 08/14/2016  .  Perimenopausal vasomotor symptoms 02/11/2016  . 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 If you have any questions, ask your nurse or doctor.        STOP taking these medications   cephALEXin 500 MG capsule Commonly known as: Keflex Stopped by: Valentina Shaggy, MD   doxycycline 100 MG tablet Commonly known as: VIBRA-TABS Stopped by: Valentina Shaggy, MD   GAVISCON PO Stopped by: Valentina Shaggy, MD   nystatin 100000 UNIT/ML suspension Commonly known as: MYCOSTATIN Stopped by: Valentina Shaggy, MD   omeprazole 20 MG capsule  Commonly known as: PRILOSEC Stopped by: Valentina Shaggy, MD     TAKE these medications   budesonide 0.5 MG/2ML nebulizer solution Commonly known as: PULMICORT 0.5 mg (85mL) mixed with saline into each nostril daily Started by: Valentina Shaggy, MD   EPINEPHrine 0.3 mg/0.3 mL Soaj injection Commonly known as: EPI-PEN Inject 0.3 mLs (0.3 mg total) into the skin as needed.   estradiol 0.0375 MG/24HR Commonly known as: VIVELLE-DOT Place 1 patch onto the skin 2 (two) times a week.   Synthroid 112 MCG tablet Generic drug: levothyroxine TAKE 1 TABLET BY MOUTH ONCE DAILY What changed:   how much to take  when to take this   terconazole 0.4 % vaginal cream Commonly known as: TERAZOL 7 Place 1 applicator vaginally at bedtime.       Birth History: non-contributory  Developmental History: non-contributory  Past Surgical History: Past Surgical History:  Procedure Laterality Date  . ABDOMINAL HYSTERECTOMY     bleeding. fibroids  . CESAREAN SECTION    .  CHOLECYSTECTOMY    . COLONOSCOPY  2011   [place]: two 3-4 mm sessile polyps, hyperplastic, sigmoid diverticula, TI appeared normal.   . ESOPHAGOGASTRODUODENOSCOPY  2011   [place]: non-bleeding erosive gastropathy, normal duodenum. path: unremarkable duodenum, negative H.pylori, minimal esophagitis   . TONSILLECTOMY  1975     Family History: Family History  Problem Relation Age of Onset  . Cancer Maternal Grandmother        breast  . Breast cancer Maternal Grandmother   . Cancer Maternal Grandfather   . Cancer Paternal Grandmother   . Cancer Paternal Grandfather   . Breast cancer Paternal Grandfather   . Arthritis Mother   . Cancer Mother        breast  . COPD Mother   . Breast cancer Mother   . Cancer Father        throat  . Stroke Father   . Cancer Maternal Aunt [address] Lacks, cervical cancer  . Breast cancer Maternal Aunt   . Breast cancer Paternal Aunt   . Colon cancer Neg Hx      Social History: Aubrii lives at [employer] by herself. She lives in a house that is 71 years old. There is water damage in the home. There is some basement mildew. There is wood flooring throughout the home. She has gas heating and central cooling. There is a dust mite cover for the bed, but not the pillows. There is no tobacco exposure in the home. She is currently retired but she does take care of her mother. She previously worked in the [employer] when she lived in [place]. Her maternal aunt is Henriette Lacks of the HeLa cell line.    Review of Systems  Constitutional: Negative.  Negative for chills, fever, malaise/fatigue and weight loss.  HENT: Positive for congestion, sinus pain and sore throat. Negative for ear discharge and ear pain.        Positive for postnasal drip.  Eyes: Negative for pain, discharge and redness.  Respiratory: Negative for cough, sputum production, shortness of breath and wheezing.   Cardiovascular: Negative.  Negative for chest pain and  palpitations.  Gastrointestinal: Negative for abdominal pain, constipation, diarrhea, heartburn, nausea and vomiting.  Skin: Negative.  Negative for itching and rash.  Neurological: Negative for dizziness and headaches.  Endo/Heme/Allergies: Negative for environmental allergies. Does not bruise/bleed easily.       Objective:   Blood pressure 120/72, pulse  66, temperature (!) 97.2 F (36.2 C), temperature source Temporal, resp. rate 16, height 5\' 6"  (1.676 m), weight 193 lb 12.8 oz (87.9 kg), SpO2 96 %. Body mass index is 31.28 kg/m.   Physical Exam:   Physical Exam  Constitutional: She appears well-developed.  Very anxious female. Paces around the room.   HENT:  Head: Normocephalic and atraumatic.  Right Ear: Tympanic membrane, external ear and ear canal normal. No drainage, swelling or tenderness. Tympanic membrane is not injected, not scarred, not erythematous, not retracted and not bulging.  Left Ear: Tympanic membrane, external ear and ear canal normal. No drainage, swelling or tenderness. Tympanic membrane is not injected, not scarred, not erythematous, not retracted and not bulging.  Nose: Mucosal edema and rhinorrhea (scant) present. No nasal deformity or septal deviation. No epistaxis. Right sinus exhibits no maxillary sinus tenderness and no frontal sinus tenderness. Left sinus exhibits no maxillary sinus tenderness and no frontal sinus tenderness.  Mouth/Throat: Uvula is midline and oropharynx is clear and moist. Mucous membranes are not pale and not dry.  Cobblestoning present in the posterior oropharynx.   Eyes: Pupils are equal, round, and reactive to light. Conjunctivae and EOM are normal. Right eye exhibits no chemosis and no discharge. Left eye exhibits no chemosis and no discharge. Right conjunctiva is not injected. Left conjunctiva is not injected.  Cardiovascular: Normal rate, regular rhythm and normal heart sounds.  Respiratory: Effort normal and breath sounds  normal. No accessory muscle usage. No tachypnea. No respiratory distress. She has no wheezes. She has no rhonchi. She has no rales. She exhibits no tenderness.  Cobblestoning present in the posterior oropharynx. Uvula midline. Tonsils 2+ bilaterally without exudates.   GI: There is no abdominal tenderness. There is no rebound and no guarding.  Lymphadenopathy:       Head (right side): No submandibular, no tonsillar and no occipital adenopathy present.       Head (left side): No submandibular, no tonsillar and no occipital adenopathy present.    She has no cervical adenopathy.  Neurological: She is alert.  Skin: No abrasion, no petechiae and no rash noted. Rash is not papular, not vesicular and not urticarial. No erythema. No pallor.  Psychiatric: She has a normal mood and affect.     Diagnostic studies: labs sent instead       Salvatore Marvel, MD [facility]

## 2019-02-07 ENCOUNTER — Telehealth: Payer: Self-pay

## 2019-02-07 ENCOUNTER — Encounter: Payer: Self-pay | Admitting: Allergy & Immunology

## 2019-02-07 MED ORDER — BUDESONIDE 0.5 MG/2ML IN SUSP: RESPIRATORY_TRACT | 5 refills | Status: DC

## 2019-02-07 NOTE — Telephone Encounter (Signed)
Dr. Ernst Bowler I do not believe the patient's insurance will cover this. She has medicare. Please advise on alternatives and thank you.

## 2019-02-07 NOTE — Telephone Encounter (Signed)
Patient is calling with some questions about her medications that were sent in yesterday. She also states that her nasal spray is requiring an authorization.   Please Advise.

## 2019-02-07 NOTE — Telephone Encounter (Signed)
I spoke with patient about her medication concerns. She was hesitant to take the Allegra because she did not feel that it was what she needed for her symptoms. I let her know that the combination of Allegra and the budesonide rinses may help with the nasal congestion and inflammation inside of her sinus passages. Patient was agreeable with this. She let me know that the pharmacy had told her that the budesonide respules maybe difficult to get covered with her insurance. I changed the prescription and let Dr. Ernst Bowler review it prior to submission. I let her know that the pharmacy should receive it any time and if there were any further issues to let us know. Patient also requested a new copy of the budesonide nasal rinse instructions be sent to her. I copied this from her patient instructions in the note and sent it to her in a mychart message.

## 2019-02-07 NOTE — Addendum Note (Signed)
Addended by: Lucrezia Starch I on: 02/07/2019 04:26 PM   Modules accepted: Orders

## 2019-02-09 LAB — ALLERGY PANEL 18, NUT MIX GROUP
Allergen Coconut IgE: 0.13 kU/L — AB
F020-IgE Almond: <0.1 kU/L
F202-IgE Cashew Nut: <0.1 kU/L
Hazelnut (Filbert) IgE: <0.1 kU/L
Peanut IgE: <0.1 kU/L
Pecan Nut IgE: <0.1 kU/L
Sesame Seed IgE: <0.1 kU/L

## 2019-02-09 LAB — IGE+ALLERGENS ZONE 2(30)
Alternaria Alternata IgE: <0.1 kU/L
Amer Sycamore IgE Qn: <0.1 kU/L
Aspergillus Fumigatus IgE: <0.1 kU/L
Bahia Grass IgE: <0.1 kU/L
Bermuda Grass IgE: <0.1 kU/L
Cat Dander IgE: <0.1 kU/L
Cedar, Mountain IgE: <0.1 kU/L
Cladosporium Herbarum IgE: <0.1 kU/L
Cockroach, American IgE: <0.1 kU/L
Common Silver Birch IgE: <0.1 kU/L
D Farinae IgE: <0.1 kU/L
D Pteronyssinus IgE: 0.1 kU/L — AB
Dog Dander IgE: <0.1 kU/L
Elm, American IgE: <0.1 kU/L
Hickory, White IgE: <0.1 kU/L
IgE (Immunoglobulin E), Serum: 696 IU/mL — ABNORMAL HIGH (ref 6–495)
Johnson Grass IgE: <0.1 kU/L
Maple/Box Elder IgE: <0.1 kU/L
Mucor Racemosus IgE: <0.1 kU/L
Mugwort IgE Qn: <0.1 kU/L
Nettle IgE: 0.21 kU/L — AB
Oak, White IgE: <0.1 kU/L
Penicillium Chrysogen IgE: <0.1 kU/L
Pigweed, Rough IgE: <0.1 kU/L
Plantain, English IgE: <0.1 kU/L
Ragweed, Short IgE: <0.1 kU/L
Sheep Sorrel IgE Qn: <0.1 kU/L
Stemphylium Herbarum IgE: <0.1 kU/L
Sweet gum IgE RAST Ql: <0.1 kU/L
Timothy Grass IgE: <0.1 kU/L
White Mulberry IgE: <0.1 kU/L

## 2019-02-09 LAB — ALLERGEN PROFILE, MOLD
Aureobasidi Pullulans IgE: <0.1 kU/L
Candida Albicans IgE: <0.1 kU/L
M009-IgE Fusarium proliferatum: <0.1 kU/L
M014-IgE Epicoccum purpur: <0.1 kU/L
Phoma Betae IgE: <0.1 kU/L
Setomelanomma Rostrat: <0.1 kU/L

## 2019-02-09 LAB — ALLERGEN WALNUT F256: Walnut IgE: <0.1 kU/L

## 2019-02-09 LAB — ALLERGY PANEL 19, SEAFOOD GROUP
Allergen Salmon IgE: <0.1 kU/L
Catfish: <0.1 kU/L
Codfish IgE: <0.1 kU/L
F023-IgE Crab: <0.1 kU/L
F080-IgE Lobster: <0.1 kU/L
Shrimp IgE: <0.1 kU/L
Tuna: <0.1 kU/L

## 2019-02-09 LAB — TRYPTASE: Tryptase: 4.9 ug/L (ref 2.2–13.2)

## 2019-02-09 LAB — ALLERGEN, BRAZIL NUT, F18: Brazil Nut IgE: <0.1 kU/L

## 2019-02-10 ENCOUNTER — Encounter: Payer: Self-pay | Admitting: Allergy & Immunology

## 2019-02-10 NOTE — Telephone Encounter (Signed)
PA was not required per Hinton Dyer at [facility]. I spoke with the patient's pharmacy and they informed me that the prescription went through with no problems. The medication just has to be ordered. I have sent a mychart message to the patient advising her of this info.

## 2019-02-11 ENCOUNTER — Other Ambulatory Visit: Payer: Self-pay

## 2019-02-11 ENCOUNTER — Ambulatory Visit (INDEPENDENT_AMBULATORY_CARE_PROVIDER_SITE_OTHER): Payer: Medicare Other | Admitting: Family Medicine

## 2019-02-11 VITALS — BP 107/70 | HR 98 | Temp 97.7°F | Ht 66.0 in | Wt 192.2 lb

## 2019-02-11 DIAGNOSIS — F419 Anxiety disorder, unspecified: Secondary | ICD-10-CM

## 2019-02-11 DIAGNOSIS — E038 Other specified hypothyroidism: Secondary | ICD-10-CM

## 2019-02-11 NOTE — Patient Instructions (Addendum)
Vit D 2000IU TSH/T4F -have blood drawn in 1 month Endo referral Trial of xyzal or zyrtec-children's dose Melatonin trial  [email or web address] (immunizations)  Hypothyroidism  Hypothyroidism is when the thyroid gland does not make enough of certain hormones (it is underactive). The thyroid gland is a small gland located in the lower front part of the neck, just in front of the windpipe (trachea). This gland makes hormones that help control how the body uses food for energy (metabolism) as well as how the heart and brain function. These hormones also play a role in keeping your bones strong. When the thyroid is underactive, it produces too little of the hormones thyroxine (T4) and triiodothyronine (T3). What are the causes? This condition may be caused by:  Hashimoto's disease. This is a disease in which the body's disease-fighting system (immune system) attacks the thyroid gland. This is the most common cause.  Viral infections.  Pregnancy.  Certain medicines.  Birth defects.  Past radiation treatments to the head or neck for cancer.  Past treatment with radioactive iodine.  Past exposure to radiation in the environment.  Past surgical removal of part or all of the thyroid.  Problems with a gland in the center of the brain (pituitary gland).  Lack of enough iodine in the diet. What increases the risk? You are more likely to develop this condition if:  You are female.  You have a family history of thyroid conditions.  You use a medicine called lithium.  You take medicines that affect the immune system (immunosuppressants). What are the signs or symptoms? Symptoms of this condition include:  Feeling as though you have no energy (lethargy).  Not being able to tolerate cold.  Weight gain that is not explained by a change in diet or exercise habits.  Lack of appetite.  Dry skin.  Coarse hair.  Menstrual irregularity.  Slowing of thought  processes.  Constipation.  Sadness or depression. How is this diagnosed? This condition may be diagnosed based on:  Your symptoms, your medical history, and a physical exam.  Blood tests. You may also have imaging tests, such as an ultrasound or MRI. How is this treated? This condition is treated with medicine that replaces the thyroid hormones that your body does not make. After you begin treatment, it may take several weeks for symptoms to go away. Follow these instructions at home:  Take over-the-counter and prescription medicines only as told by your health care provider.  If you start taking any new medicines, tell your health care provider.  Keep all follow-up visits as told by your health care provider. This is important. ? As your condition improves, your dosage of thyroid hormone medicine may change. ? You will need to have blood tests regularly so that your health care provider can monitor your condition. Contact a health care provider if:  Your symptoms do not get better with treatment.  You are taking thyroid replacement medicine and you: ? Sweat a lot. ? Have tremors. ? Feel anxious. ? Lose weight rapidly. ? Cannot tolerate heat. ? Have emotional swings. ? Have diarrhea. ? Feel weak. Get help right away if you have:  Chest pain.  An irregular heartbeat.  A rapid heartbeat.  Difficulty breathing. Summary  Hypothyroidism is when the thyroid gland does not make enough of certain hormones (it is underactive).  When the thyroid is underactive, it produces too little of the hormones thyroxine (T4) and triiodothyronine (T3).  The most common cause is Hashimoto's disease, a disease in  which the body's disease-fighting system (immune system) attacks the thyroid gland. The condition can also be caused by viral infections, medicine, pregnancy, or past radiation treatment to the head or neck.  Symptoms may include weight gain, dry skin, constipation, feeling as  though you do not have energy, and not being able to tolerate cold.  This condition is treated with medicine to replace the thyroid hormones that your body does not make. This information is not intended to replace advice given to you by your health care provider. Make sure you discuss any questions you have with your health care provider. Document Released: 05/01/2005 Document Revised: 04/13/2017 Document Reviewed: 04/11/2017 Elsevier Patient Education  2020 [employer].

## 2019-02-14 NOTE — Progress Notes (Signed)
Established Patient Office Visit  Subjective:  Patient ID: Kara [name], female    DOB: 01/21/1946  Age: 73 y.o. MRN: [id number]  CC:  Chief Complaint  Patient presents with  . Follow-up  . Results    labs    HPI Kara [name] presents for allergies  Hypothyroid pt restarted higher dose of medication 180mcg. Pt states she felt better on higher dose with less anxiety, pacing and less jittery. Pt was lowered to 172mcg. TSH normal.   Past Medical History:  Diagnosis Date  . Allergy    food allergies, drug allergies  . Anemia   . Anxiety   . Cataract   . Diabetes mellitus without complication (HCC)    no medication   . GERD (gastroesophageal reflux disease)   . Hyperlipidemia   . Thyroid disease     Past Surgical History:  Procedure Laterality Date  . ABDOMINAL HYSTERECTOMY     bleeding. fibroids  . CESAREAN SECTION    . CHOLECYSTECTOMY    . COLONOSCOPY  2011   [place]: two 3-4 mm sessile polyps, hyperplastic, sigmoid diverticula, TI appeared normal.   . ESOPHAGOGASTRODUODENOSCOPY  2011   [place]: non-bleeding erosive gastropathy, normal duodenum. path: unremarkable duodenum, negative H.pylori, minimal esophagitis   . TONSILLECTOMY  1975    Family History  Problem Relation Age of Onset  . Cancer Maternal Grandmother        breast  . Breast cancer Maternal Grandmother   . Cancer Maternal Grandfather   . Cancer Paternal Grandmother   . Cancer Paternal Grandfather   . Breast cancer Paternal Grandfather   . Arthritis Mother   . Cancer Mother        breast  . COPD Mother   . Breast cancer Mother   . Cancer Father        throat  . Stroke Father   . Cancer Maternal Aunt [address] Lacks, cervical cancer  . Breast cancer Maternal Aunt   . Breast cancer Paternal Aunt   . Colon cancer Neg Hx     Social History   Socioeconomic History  . Marital status: Divorced    Spouse name: Not on file  . Number of children: 2  . Years of education: 70  .  Highest education level: Not on file  Occupational History  . 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 Financial resource strain: Not hard at all  . Food insecurity    Worry: Never true    Inability: Never true  . Transportation needs    Medical: No    Non-medical: No  Tobacco Use  . Smoking status: Former Research scientist (life sciences)  . Smokeless tobacco: Never Used  . Tobacco comment: quit 2002  Substance and Sexual Activity  . Alcohol use: No  . Drug use: No  . Sexual activity: Not Currently    Birth control/protection: Surgical    Comment: hyst  Lifestyle  . Physical activity    Days per week: 0 days    Minutes per session: 0 min  . Stress: Rather much  Relationships  . Social connections    Talks on phone: More than three times a week    Gets together: More than three times a week    Attends religious service: 1 to 4 times per year    Active member of club or organization: Yes    Attends meetings of clubs or organizations:  1 to 4 times per year    Relationship status: Divorced  . Intimate partner violence    Fear of current or ex partner: No    Emotionally abused: No    Physically abused: No    Forced sexual activity: No  Other Topics Concern  . 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 budesonide (PULMICORT) 0.5 MG/2ML nebulizer solution 1 tube mixed as directed for nasal irrigation once daily. 60 mL 5  . EPINEPHrine 0.3 mg/0.3 mL IJ SOAJ injection Inject 0.3 mLs (0.3 mg total) into the skin as needed. 1 Device 1  . estradiol (VIVELLE-DOT) 0.0375 MG/24HR Place 1 patch onto the skin 2 (two) times a week.    Marland Kitchen SYNTHROID 112 MCG tablet TAKE 1 TABLET BY MOUTH ONCE DAILY  (Patient taking differently: Take 100 mcg by mouth AC breakfast. ) 90 tablet 0  . terconazole (TERAZOL 7) 0.4 % vaginal cream Place 1 applicator vaginally at bedtime. 45 g 0   No facility-administered medications prior to visit.     Allergies  Allergen Reactions  . Other Anaphylaxis    Allergic to seafood and nuts  . Prednisone   . Ibuprofen Other (See Comments)    Uncertain - headache  . Naproxen Other (See Comments)    headache  . Penicillins Swelling    ROS Review of Systems  Constitutional: Positive for fatigue.  HENT: Positive for congestion and sinus pressure.   Respiratory: Negative.   Cardiovascular: Negative.   Gastrointestinal: Negative.   Allergic/Immunologic: Positive for environmental allergies.      Objective:    Physical Exam  Constitutional: She appears well-developed and well-nourished.  HENT:  Head: Atraumatic.  Cardiovascular: Normal rate and regular rhythm.    BP 107/70 (BP Location: Left Arm, Patient Position: Sitting, Cuff Size: Normal)   Pulse 98   Temp 97.7 F (36.5 C) (Oral)   Ht 5\' 6"  (1.676 m)   Wt 192 lb 3.2 oz (87.2 kg)   SpO2 98%   BMI 31.02 kg/m  Wt Readings from Last 3 Encounters:  02/11/19 192 lb 3.2 oz (87.2 kg)  02/06/19 193 lb 12.8 oz (87.9 kg)  01/22/19 197 lb 9.6 oz (89.6 kg)     Health Maintenance Due  Topic Date Due  . FOOT EXAM  07/19/1957  . PNA vac Low Risk Adult (2 of 2 - PPSV23) 07/05/2017  . OPHTHALMOLOGY EXAM  06/01/2018  . URINE MICROALBUMIN  08/17/2018  . INFLUENZA VACCINE  12/14/2018    Lab Results  Component Value Date   TSH 0.52 01/15/2019   Lab Results  Component Value Date   WBC 9.4 01/19/2019   HGB 13.5 01/19/2019   HCT 43.2 01/19/2019   MCV 84.7 01/19/2019   PLT 226 01/19/2019   Lab Results  Component Value Date   NA 138 01/19/2019   K 3.8 01/19/2019   CO2 26 01/19/2019   GLUCOSE 122 (H) 01/19/2019   BUN 12 01/19/2019   CREATININE 0.Kara [name] 01/19/2019   BILITOT 0.4 01/19/2019    ALKPHOS 95 01/19/2019   AST 28 01/19/2019   ALT 34 01/19/2019   PROT 6.8 01/19/2019   ALBUMIN 3.5 01/19/2019  CALCIUM 9.1 01/19/2019   ANIONGAP 7 01/19/2019   Lab Results  Component Value Date   CHOL 203 (H) 08/16/2017   Lab Results  Component Value Date   HDL 57 01/15/2019   Lab Results  Component Value Date   LDLCALC 85 01/15/2019   Lab Results  Component Value Date   TRIG 64 08/16/2017   Lab Results  Component Value Date   CHOLHDL 3.6 08/16/2017   Lab Results  Component Value Date   HGBA1C 5.9 01/15/2019   HGBA1C 5.9 01/15/2019      Assessment & Plan:  Discuss with pt about allergies , thyroid disease diagnosis and treatment. Greater than 50 percent of appt was spent in discussion/counseling. Melatonin trial for going to sleep.  xyzal -children's dose for allergies.  Immunizations suggestions.  Problem List Items Addressed This Visit      Endocrine   Hypothyroid - Primary   Relevant Orders   TSH + free T4   Ambulatory referral to Endocrinology     Follow-up: pt request referral to endocrine to discuss treatment options. Pt concerned that less thyroid medication is causing worsening anxiety. Repeat labwork -pt has RESTARTED 117mcg daily. Pt will have labwork completed in 5-6 weeks to recheck levels.  LISA Hannah Beat, MD

## 2019-02-17 ENCOUNTER — Encounter: Payer: Self-pay | Admitting: Family Medicine

## 2019-02-24 DIAGNOSIS — H01111 Allergic dermatitis of right upper eyelid: Secondary | ICD-10-CM | POA: Diagnosis not present

## 2019-02-24 DIAGNOSIS — H0102A Squamous blepharitis right eye, upper and lower eyelids: Secondary | ICD-10-CM | POA: Diagnosis not present

## 2019-02-24 DIAGNOSIS — H01114 Allergic dermatitis of left upper eyelid: Secondary | ICD-10-CM | POA: Diagnosis not present

## 2019-02-24 DIAGNOSIS — H04123 Dry eye syndrome of bilateral lacrimal glands: Secondary | ICD-10-CM | POA: Diagnosis not present

## 2019-02-25 ENCOUNTER — Ambulatory Visit: Payer: Medicare Other

## 2019-02-27 ENCOUNTER — Other Ambulatory Visit: Payer: Self-pay

## 2019-02-27 ENCOUNTER — Ambulatory Visit (INDEPENDENT_AMBULATORY_CARE_PROVIDER_SITE_OTHER): Payer: Medicare Other | Admitting: "Endocrinology

## 2019-02-27 ENCOUNTER — Encounter: Payer: Self-pay | Admitting: "Endocrinology

## 2019-02-27 VITALS — BP 105/56 | HR 90 | Ht 66.0 in | Wt 192.0 lb

## 2019-02-27 DIAGNOSIS — E890 Postprocedural hypothyroidism: Secondary | ICD-10-CM

## 2019-02-27 MED ORDER — SYNTHROID 112 MCG PO TABS: 112.0000 ug | ORAL_TABLET | Freq: Every day | ORAL | 0 refills | Status: DC

## 2019-02-27 NOTE — Progress Notes (Signed)
Endocrinology Consult Note                                         02/27/2019, 2:56 PM   Kara [name] is a 73 y.o.-year-old female patient being seen in consultation for RAI induced hypothyroidism referred by Maryruth Hancock, MD.   Past Medical History:  Diagnosis Date  . Allergy    food allergies, drug allergies  . Anemia   . Anxiety   . Cataract   . Diabetes mellitus without complication (HCC)    no medication   . GERD (gastroesophageal reflux disease)   . Hyperlipidemia   . Thyroid disease     Past Surgical History:  Procedure Laterality Date  . ABDOMINAL HYSTERECTOMY     bleeding. fibroids  . CESAREAN SECTION    . CHOLECYSTECTOMY    . COLONOSCOPY  2011   [place]: two 3-4 mm sessile polyps, hyperplastic, sigmoid diverticula, TI appeared normal.   . ESOPHAGOGASTRODUODENOSCOPY  2011   [place]: non-bleeding erosive gastropathy, normal duodenum. path: unremarkable duodenum, negative H.pylori, minimal esophagitis   . TONSILLECTOMY  1975    Social History   Socioeconomic History  . Marital status: Divorced    Spouse name: Not on file  . Number of children: 2  . Years of education: 66  . Highest education level: Not on file  Occupational History  . 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 Financial resource strain: Not hard at all  . Food insecurity    Worry: Never true    Inability: Never true  . Transportation needs    Medical: No    Non-medical: No  Tobacco Use  . Smoking status: Former Research scientist (life sciences)  . Smokeless tobacco: Never Used  . Tobacco comment: quit 2002  Substance and Sexual Activity  . Alcohol use: No  . Drug use: No  . Sexual activity: Not Currently    Birth control/protection: Surgical    Comment: hyst  Lifestyle  . Physical activity    Days per week: 0 days    Minutes per session: 0 min  . Stress: Rather much   Relationships  . Social connections    Talks on phone: More than three times a week    Gets together: More than three times a week    Attends religious service: 1 to 4 times per year    Active member of club or organization: Yes    Attends meetings of clubs or organizations: 1 to 4 times per year    Relationship status: Divorced  Other Topics Concern  . 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 Cancer Maternal Grandmother        breast  . Breast cancer Maternal Grandmother   . Cancer Maternal Grandfather   . Cancer Paternal Grandmother   . Cancer Paternal Grandfather   . Breast cancer Paternal Grandfather   . Arthritis Mother   . Cancer Mother        breast  . COPD Mother   . Breast cancer Mother   . Cancer Father        throat  . Stroke Father   . Cancer Maternal Aunt [address] L[place], cervical cancer  . Breast cancer Maternal Aunt   . Breast cancer Paternal Aunt   . Colon cancer Neg Hx     Outpatient Encounter Medications as of 02/27/2019  Medication Sig  . levocetirizine (XYZAL) 5 MG tablet Take 2.5 mg by mouth every evening.   . budesonide (PULMICORT) 0.5 MG/2ML nebulizer solution 1 tube mixed as directed for nasal irrigation once daily.  Marland Kitchen EPINEPHrine 0.3 mg/0.3 mL IJ SOAJ injection Inject 0.3 mLs (0.3 mg total) into the skin as needed.  Marland Kitchen estradiol (VIVELLE-DOT) 0.0375 MG/24HR Place 1 patch onto the skin 2 (two) times a week.  Marland Kitchen SYNTHROID 112 MCG tablet Take 1 tablet (112 mcg total) by mouth daily.  . [DISCONTINUED] SYNTHROID 112 MCG tablet TAKE 1 TABLET BY MOUTH ONCE DAILY (Patient taking differently: Take 100 mcg by mouth AC breakfast. )  . [DISCONTINUED] terconazole (TERAZOL 7) 0.4 % vaginal cream Place 1 applicator  vaginally at bedtime.   No facility-administered encounter medications on file as of 02/27/2019.     ALLERGIES: Allergies  Allergen Reactions  . Other Anaphylaxis    Allergic to seafood and nuts  . Prednisone   . Ibuprofen Other (See Comments)    Uncertain - headache  . Naproxen Other (See Comments)    headache  . Penicillins Swelling   VACCINATION STATUS: Immunization History  Administered Date(s) Administered  . Influenza,inj,Quad PF,6+ Mos 02/28/2017  . Pneumococcal Conjugate-13 07/05/2016  . Td 12/23/2018     HPI    Kara [name]  is a patient with the above medical history. she was diagnosed  with hyperthyroidism at approximate age of 37 years  which required I-131 thyroid ablation in environment.   -She was subsequently initiated on thyroid hormone supplement. she was given various doses of levothyroxine over the years, currently on 112 micrograms. she reports compliance to this medication:  Taking it daily on empty stomach  with water, separated by >30 minutes before breakfast and other medications , and by at least 4 hours from   calcium, iron, PPIs, multivitamins .  I reviewed patient's  thyroid tests:  Lab Results  Component Value Date   TSH 0.52 01/15/2019   TSH 0.58 08/16/2017   TSH 0.22 (L) 02/19/2017   TSH 0.28 (L) 12/15/2016     -She describes steady weight gain, on and off hot flashes.  She did denies tremors, palpitations. Pt denies feeling nodules in neck, hoarseness, dysphagia/odynophagia, SOB with lying down.  she denies family history of  thyroid disorders.  No family history of thyroid cancer.  Her father had head and neck tumor which did not appear to be related to thyroid cancer.  ROS:  Constitutional:  + steady weight , + fatigue, + subjective hyperthermia, no subjective hypothermia Eyes: no blurry vision, no xerophthalmia ENT: no sore throat, no nodules palpated in throat, no dysphagia/odynophagia, no hoarseness Cardiovascular:  no Chest  Pain, no Shortness of Breath, no palpitations, no leg swelling Respiratory: no cough, no SOB Gastrointestinal: no Nausea/Vomiting/Diarhhea Musculoskeletal: no muscle/joint aches Skin: no rashes Neurological: no tremors, no numbness, no tingling, no dizziness Psychiatric: no depression, no anxiety   Physical Exam: BP (!) 105/56   Pulse 90   Ht 5\' 6"  (1.676 m)   Wt 192 lb (87.1 kg)   BMI 30.99 kg/m  Wt Readings from Last 3 Encounters:  02/27/19 192 lb (87.1 kg)  02/11/19 192 lb 3.2 oz (87.2 kg)  02/06/19 193 lb 12.8 oz (87.9 kg)    Constitutional:  Body mass index is 30.99 kg/m., not in acute distress, normal state of mind Eyes: PERRLA, EOMI, no exophthalmos ENT: moist mucous membranes, no thyromegaly, no cervical lymphadenopathy Cardiovascular: normal precordial activity, Regular Rate and Rhythm, no Murmur/Rubs/Gallops Respiratory:  adequate breathing efforts, no gross chest deformity, Clear to auscultation bilaterally Gastrointestinal: abdomen soft, Non -tender, No distension, Bowel Sounds present Musculoskeletal: no gross deformities, strength intact in all four extremities Skin: moist, warm, no rashes Neurological: no tremor with outstretched hands, Deep tendon reflexes normal in all four extremities.   CMP     Component Value Date/Time   NA 138 01/19/2019 0329   K 3.8 01/19/2019 0329   CL 105 01/19/2019 0329   CO2 26 01/19/2019 0329   GLUCOSE 122 (H) 01/19/2019 0329   BUN 12 01/19/2019 0329   CREATININE 0.69 01/19/2019 0329   CREATININE 0.78 08/16/2017 1026   CALCIUM 9.1 01/19/2019 0329   PROT 6.8 01/19/2019 0329   ALBUMIN 3.5 01/19/2019 0329   AST 28 01/19/2019 0329   ALT 34 01/19/2019 0329   ALKPHOS 95 01/19/2019 0329   BILITOT 0.4 01/19/2019 0329   GFRNONAA >60 01/19/2019 0329   GFRNONAA 77 08/16/2017 1026   GFRAA >60 01/19/2019 0329   GFRAA 89 08/16/2017 1026    Diabetic Labs (most recent): Lab Results  Component Value Date   HGBA1C 5.9 01/15/2019    HGBA1C 5.9 01/15/2019   HGBA1C 5.9 (H) 08/16/2017     Lipid Panel ( most recent) Lipid Panel     Component Value Date/Time   CHOL 203 (H) 08/16/2017 1026   TRIG 64 08/16/2017 1026   HDL 57 01/15/2019   CHOLHDL 3.6 08/16/2017 1026   VLDL 15 11/24/2016 1018   LDLCALC 85 01/15/2019   LDLCALC 131 (H) 08/16/2017 1026     Lab Results  Component Value Date   TSH 0.52 01/15/2019   TSH 0.58 08/16/2017   TSH 0.22 (L) 02/19/2017   TSH 0.28 (L) 12/15/2016       ASSESSMENT: 1. Hypothyroidism- RAI induced  PLAN:    Patient with long-standing RAI induced hypothyroidism, on levothyroxine therapy. On physical exam , patient  does not  have  gross goiter, thyroid nodules, or neck compression symptoms. She will need a new set of thyroid function tests before making adjustment in her doses.  They will be sent to lab for TSH/free T4, antithyroid antibodies.    -In the meantime, she is advised to continue Synthroid 112 mcg p.o. daily before breakfast. - We discussed about correct intake of levothyroxine, at fasting, with water, separated by at least 30 minutes from breakfast, and separated by more than 4 hours from calcium, iron, multivitamins, acid reflux medications (PPIs). -Patient is made aware of the fact that thyroid hormone replacement is needed for life, dose to be adjusted by periodic monitoring of thyroid function tests.  -Due to absence of clinical  goiter, no need for thyroid ultrasound.  - Time spent with the patient: 45 minutes, of which >50% was spent in obtaining information about her symptoms, reviewing her previous labs, evaluations, and treatments, counseling her about her RAI induced hypothyroidism, and developing a plan to confirm the diagnosis and long term treatment as necessary. Please refer to " Patient Self Inventory" in the Media  tab for reviewed elements of pertinent patient history.  [name] participated in the discussions, expressed understanding, and  voiced agreement with the above plans.  All questions were answered to her satisfaction. she is encouraged to contact clinic should she have any questions or concerns prior to her return visit.  Return in about 1 week (around 03/06/2019), or phone, for Labs Today- Non-Fasting Ok.  Glade Lloyd, MD [facility] [address] [place], [place] [address] Phone: [phone]  Fax: [phone]   02/27/2019, 2:56 PM  This note was partially dictated with voice recognition software. Similar sounding words can be transcribed inadequately or may not  be corrected upon review.

## 2019-02-28 LAB — THYROID PEROXIDASE ANTIBODY: Thyroperoxidase Ab SerPl-aCnc: 3 IU/mL (ref <9)

## 2019-02-28 LAB — TSH: TSH: 0.34 mIU/L — ABNORMAL LOW (ref 0.40–4.50)

## 2019-02-28 LAB — THYROGLOBULIN ANTIBODY: Thyroglobulin Ab: <1 IU/mL (ref <=1)

## 2019-02-28 LAB — T4, FREE: Free T4: 1.5 ng/dL (ref 0.8–1.8)

## 2019-03-06 ENCOUNTER — Other Ambulatory Visit: Payer: Self-pay

## 2019-03-06 ENCOUNTER — Ambulatory Visit (INDEPENDENT_AMBULATORY_CARE_PROVIDER_SITE_OTHER): Payer: Medicare Other | Admitting: "Endocrinology

## 2019-03-06 ENCOUNTER — Encounter: Payer: Self-pay | Admitting: "Endocrinology

## 2019-03-06 DIAGNOSIS — E890 Postprocedural hypothyroidism: Secondary | ICD-10-CM | POA: Diagnosis not present

## 2019-03-06 MED ORDER — SYNTHROID 112 MCG PO TABS: 112.0000 ug | ORAL_TABLET | Freq: Every day | ORAL | 1 refills | Status: DC

## 2019-03-06 NOTE — Progress Notes (Signed)
03/06/2019, 5:09 PM                                Endocrinology Telehealth Visit Follow up Note -During COVID -19 Pandemic  I connected with Kara [name] on 03/06/2019   by telephone and verified that I am speaking with the correct person using two identifiers. Kara [name], Jul 17, 1945. she has verbally consented to this visit. All issues noted in this document were discussed and addressed. The format was not optimal for physical exam.   Kara [name] is a 73 y.o.-year-old female patient being engaged in telehealth via telephone in follow-up after she was seen in consultation for RAI induced hypothyroidism referred by Maryruth Hancock, MD.   Past Medical History:  Diagnosis Date  . Allergy    food allergies, drug allergies  . Anemia   . Anxiety   . Cataract   . Diabetes mellitus without complication (HCC)    no medication   . GERD (gastroesophageal reflux disease)   . Hyperlipidemia   . Thyroid disease     Past Surgical History:  Procedure Laterality Date  . ABDOMINAL HYSTERECTOMY     bleeding. fibroids  . CESAREAN SECTION    . CHOLECYSTECTOMY    . COLONOSCOPY  2011   [place]: two 3-4 mm sessile polyps, hyperplastic, sigmoid diverticula, TI appeared normal.   . ESOPHAGOGASTRODUODENOSCOPY  2011   [place]: non-bleeding erosive gastropathy, normal duodenum. path: unremarkable duodenum, negative H.pylori, minimal esophagitis   . TONSILLECTOMY  1975    Social History   Socioeconomic History  . Marital status: Divorced    Spouse name: Not on file  . Number of children: 2  . Years of education: 71  . Highest education level: Not on file  Occupational History  . 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 Financial resource strain: Not hard at all  . Food insecurity    Worry: Never true    Inability:  Never true  . Transportation needs    Medical: No    Non-medical: No  Tobacco Use  . Smoking status: Former Research scientist (life sciences)  . Smokeless tobacco: Never Used  . Tobacco comment: quit 2002  Substance and Sexual Activity  . Alcohol use: No  . Drug use: No  . Sexual activity: Not Currently    Birth control/protection: Surgical    Comment: hyst  Lifestyle  . Physical activity    Days per week: 0 days    Minutes per session: 0 min  . Stress: Rather much  Relationships  . Social connections    Talks on phone: More than three times a week    Gets together: More than three times a week    Attends religious service: 1 to 4  times per year    Active member of club or organization: Yes    Attends meetings of clubs or organizations: 1 to 4 times per year    Relationship status: Divorced  Other Topics Concern  . 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 Cancer Maternal Grandmother        breast  . Breast cancer Maternal Grandmother   . Cancer Maternal Grandfather   . Cancer Paternal Grandmother   . Cancer Paternal Grandfather   . Breast cancer Paternal Grandfather   . Arthritis Mother   . Cancer Mother        breast  . COPD Mother   . Breast cancer Mother   . Cancer Father        throat  . Stroke Father   . Cancer Maternal Aunt [address] Lacks, cervical cancer  . Breast cancer Maternal Aunt   . Breast cancer Paternal Aunt   . Colon cancer Neg Hx     Outpatient Encounter Medications as of 03/06/2019  Medication Sig  . budesonide (PULMICORT) 0.5 MG/2ML nebulizer solution 1 tube mixed as directed for nasal irrigation once daily.  Marland Kitchen EPINEPHrine 0.3 mg/0.3 mL IJ SOAJ injection Inject 0.3 mLs (0.3 mg total) into the skin as needed.  Marland Kitchen  estradiol (VIVELLE-DOT) 0.0375 MG/24HR Place 1 patch onto the skin 2 (two) times a week.  . levocetirizine (XYZAL) 5 MG tablet Take 2.5 mg by mouth every evening.   Marland Kitchen SYNTHROID 112 MCG tablet Take 1 tablet (112 mcg total) by mouth daily.  . [DISCONTINUED] SYNTHROID 112 MCG tablet Take 1 tablet (112 mcg total) by mouth daily.   No facility-administered encounter medications on file as of 03/06/2019.     ALLERGIES: Allergies  Allergen Reactions  . Other Anaphylaxis    Allergic to seafood and nuts  . Prednisone   . Ibuprofen Other (See Comments)    Uncertain - headache  . Naproxen Other (See Comments)    headache  . Penicillins Swelling   VACCINATION STATUS: Immunization History  Administered Date(s) Administered  . Influenza,inj,Quad PF,6+ Mos 02/28/2017  . Pneumococcal Conjugate-13 07/05/2016  . Td 12/23/2018     HPI    Kara [name]  is a patient with the above medical history. she was diagnosed  with hyperthyroidism at approximate age of 45 years  which required I-131 thyroid ablation in environment.   -She was subsequently initiated on thyroid hormone supplement.  She is currently on levothyroxine 112 mcg p.o. daily before breakfast.  She reports compliance to her medication. -She does not have new complaints today.   -She describes steady weight gain, on and off hot flashes.  She did denies tremors, palpitations. Pt denies feeling nodules in neck, hoarseness, dysphagia/odynophagia, SOB with lying down.  she denies family history of  thyroid disorders.  No family history of thyroid cancer.  Her father had head and neck tumor which did not appear to be related to thyroid cancer.  ROS:  Limited as above.   Physical Exam: There were no vitals taken for this visit. Wt Readings from Last 3 Encounters:  02/27/19 192 lb (87.1 kg)  02/11/19  192 lb 3.2 oz (87.2 kg)  02/06/19 193 lb 12.8 oz (87.9 kg)    CMP     Component Value Date/Time   NA 138 01/19/2019 0329   K  3.8 01/19/2019 0329   CL 105 01/19/2019 0329   CO2 26 01/19/2019 0329   GLUCOSE 122 (H) 01/19/2019 0329   BUN 12 01/19/2019 0329   CREATININE 0.69 01/19/2019 0329   CREATININE 0.78 08/16/2017 1026   CALCIUM 9.1 01/19/2019 0329   PROT 6.8 01/19/2019 0329   ALBUMIN 3.5 01/19/2019 0329   AST 28 01/19/2019 0329   ALT 34 01/19/2019 0329   ALKPHOS 95 01/19/2019 0329   BILITOT 0.4 01/19/2019 0329   GFRNONAA >60 01/19/2019 0329   GFRNONAA 77 08/16/2017 1026   GFRAA >60 01/19/2019 0329   GFRAA 89 08/16/2017 1026    Diabetic Labs (most recent): Lab Results  Component Value Date   HGBA1C 5.9 01/15/2019   HGBA1C 5.9 01/15/2019   HGBA1C 5.9 (H) 08/16/2017     Lipid Panel ( most recent) Lipid Panel     Component Value Date/Time   CHOL 203 (H) 08/16/2017 1026   TRIG 64 08/16/2017 1026   HDL 57 01/15/2019   CHOLHDL 3.6 08/16/2017 1026   VLDL 15 11/24/2016 1018   LDLCALC 85 01/15/2019   LDLCALC 131 (H) 08/16/2017 1026     Lab Results  Component Value Date   TSH 0.34 (L) 02/27/2019   TSH 0.52 01/15/2019   TSH 0.58 08/16/2017   TSH 0.22 (L) 02/19/2017   TSH 0.28 (L) 12/15/2016   FREET4 1.5 02/27/2019       ASSESSMENT: 1. Hypothyroidism- RAI induced  PLAN:    Patient with long-standing RAI induced hypothyroidism, on levothyroxine therapy.   -Her previsit labs confirm slight over replacement, potentially will need a lower adjustment in her dose, however patient insists that she does not do too well on a lower dose of levothyroxine.  Given the upcoming cold season, she will be allowed to stay on her current dose of levothyroxine 112 mcg p.o. every morning.    She agrees to return for a visit with repeat thyroid function tests in 69-month.    - We discussed about the correct intake of her thyroid hormone, on empty stomach at fasting, with water, separated by at least 30 minutes from breakfast and other medications,  and separated by more than 4 hours from calcium, iron,  multivitamins, acid reflux medications (PPIs). -Patient is made aware of the fact that thyroid hormone replacement is needed for life, dose to be adjusted by periodic monitoring of thyroid function tests.  -Due to absence of clinical goiter, no need for thyroid ultrasound.   Time for this visit: 15 minutes. Kara [name]  participated in the discussions, expressed understanding, and voiced agreement with the above plans.  All questions were answered to her satisfaction. she is encouraged to contact clinic should she have any questions or concerns prior to her return visit.   Return in about 4 months (around 07/07/2019) for Follow up with Pre-visit Labs.  Glade Lloyd, MD [facility] [address] [place], [place] [address] Phone: [phone]  Fax: [phone]   03/06/2019, 5:09 PM  This note was partially dictated with voice recognition software. Similar sounding words can be transcribed inadequately or may not  be corrected upon review.

## 2019-03-10 ENCOUNTER — Encounter: Payer: Self-pay | Admitting: Diagnostic Neuroimaging

## 2019-03-10 ENCOUNTER — Ambulatory Visit (INDEPENDENT_AMBULATORY_CARE_PROVIDER_SITE_OTHER): Payer: Medicare Other | Admitting: Diagnostic Neuroimaging

## 2019-03-10 ENCOUNTER — Other Ambulatory Visit: Payer: Self-pay

## 2019-03-10 ENCOUNTER — Other Ambulatory Visit: Payer: Self-pay | Admitting: *Deleted

## 2019-03-10 VITALS — BP 110/69 | HR 87 | Temp 96.8°F | Ht 66.0 in | Wt 191.8 lb

## 2019-03-10 DIAGNOSIS — R519 Headache, unspecified: Secondary | ICD-10-CM

## 2019-03-10 MED ORDER — ESTRADIOL 0.0375 MG/24HR TD PTTW: 1.0000 | MEDICATED_PATCH | TRANSDERMAL | 6 refills | Status: DC

## 2019-03-10 NOTE — Telephone Encounter (Signed)
Refill Vivelle-Dot for 6 months.  Patient will be due a telephone discussion at that time

## 2019-03-10 NOTE — Progress Notes (Signed)
[facility]  PATIENT: Kara [name] DOB: [birth year]    REFERRING CLINICIAN: Mayer Masker, MD HISTORY FROM: patient  REASON FOR VISIT: new consult    HISTORICAL  CHIEF COMPLAINT:  Chief Complaint  Patient presents with  . Headache    rm 7 New Pt "over last 2 months I've had dental work, developed allergies that gave me headaches; ibuprofen, Naproxen caused headache, take Tylenol and sinus medicine helps some; had 2 MRIs; have habit of gritting teeth all day"    HISTORY OF PRESENT ILLNESS:   73 year old female here for evaluation of headaches.  June 2020 patient was feeling well.  Around July 2020 patient received prednisone for knee pain and this resulted in hypertension and headaches.  In August 2020 she went to dentist for regular checkup was found to have some dental caries affecting #2 tooth.  This was treated with a filling, but then patient felt ongoing pain in that region radiating to her right ear and right scalp.  She went to endodontist and oral surgeon.  Ultimately this tooth was extracted.  Pain continued.  Around the same time patient was having problems with increased anxiety which she attributed to Synthroid medication adjustment and prednisone.  Patient was also feeling increasing allergy symptoms with swelling sensation in her eyes, nose, face.  Also with sinus pressure and sinus congestion symptoms.  She followed up with allergist and ENT for evaluation.  Patient went to the emergency room twice for evaluation and had CT scan of the head twice as well as CT maxillofacial sinuses, which were unremarkable.  Now headaches occur 3 times per week, lasting for few hours at a time.  Tylenol helps reduce the symptoms.  Overall headaches have slightly improved.  No nausea or vomiting.  No sensitive light or sound.  No prior similar headaches.  No history of migraine headaches.  Patient has felt very anxious during this time.  She has prior history of anxiety.  Her  sleep quality has been affected.  Patient is very sensitive to medications.  She does not want to try additional medications.  She does not want any additional scans.   REVIEW OF SYSTEMS: Full 14 system review of systems performed and negative with exception of: As per HPI.  ALLERGIES: Allergies  Allergen Reactions  . Other Anaphylaxis    Allergic to seafood and nuts  . Prednisone   . Ibuprofen Other (See Comments)    Uncertain - headache  . Naproxen Other (See Comments)    headache  . Penicillins Swelling    HOME MEDICATIONS: Outpatient Medications Prior to Visit  Medication Sig Dispense Refill  . budesonide (PULMICORT) 0.5 MG/2ML nebulizer solution 1 tube mixed as directed for nasal irrigation once daily. 60 mL 5  . estradiol (VIVELLE-DOT) 0.0375 MG/24HR Place 1 patch onto the skin 2 (two) times a week.    . levocetirizine (XYZAL) 5 MG tablet Take 2.5 mg by mouth every evening.     Marland Kitchen SYNTHROID 112 MCG tablet Take 1 tablet (112 mcg total) by mouth daily. 90 tablet 1  . EPINEPHrine 0.3 mg/0.3 mL IJ SOAJ injection Inject 0.3 mLs (0.3 mg total) into the skin as needed. (Patient not taking: Reported on 03/10/2019) 1 Device 1   No facility-administered medications prior to visit.     PAST MEDICAL HISTORY: Past Medical History:  Diagnosis Date  . Allergy    food allergies, drug allergies  . Anemia   . Anxiety   . Cataract   .  Diabetes mellitus without complication (HCC)    no medication   . GERD (gastroesophageal reflux disease)   . Hyperlipidemia   . Thyroid disease     PAST SURGICAL HISTORY: Past Surgical History:  Procedure Laterality Date  . ABDOMINAL HYSTERECTOMY     bleeding. fibroids  . CESAREAN SECTION    . CHOLECYSTECTOMY    . COLONOSCOPY  2011   [place]: two 3-4 mm sessile polyps, hyperplastic, sigmoid diverticula, TI appeared normal.   . ESOPHAGOGASTRODUODENOSCOPY  2011   [place]: non-bleeding erosive gastropathy, normal duodenum. path: unremarkable  duodenum, negative H.pylori, minimal esophagitis   . TONSILLECTOMY  1975    FAMILY HISTORY: Family History  Problem Relation Age of Onset  . Cancer Maternal Grandmother        breast  . Breast cancer Maternal Grandmother   . Cancer Maternal Grandfather   . Cancer Paternal Grandmother   . Cancer Paternal Grandfather   . Breast cancer Paternal Grandfather   . Arthritis Mother   . Cancer Mother        breast  . COPD Mother   . Breast cancer Mother   . Cancer Father        throat  . Stroke Father   . Cancer Maternal Aunt [address] Lacks, cervical cancer  . Breast cancer Maternal Aunt   . Breast cancer Paternal Aunt   . Colon cancer Neg Hx     SOCIAL HISTORY: Social History   Socioeconomic History  . Marital status: Divorced    Spouse name: Not on file  . Number of children: 2  . Years of education: 28  . Highest education level: Not on file  Occupational History  . 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 Financial resource strain: Not hard at all  . Food insecurity    Worry: Never true    Inability: Never true  . Transportation needs    Medical: No    Non-medical: No  Tobacco Use  . Smoking status: Former Research scientist (life sciences)  . Smokeless tobacco: Never Used  . Tobacco comment: quit 2002  Substance and Sexual Activity  . Alcohol use: No  . Drug use: No  . Sexual activity: Not Currently    Birth control/protection: Surgical    Comment: hyst  Lifestyle  . Physical activity    Days per week: 0 days    Minutes per session: 0 min  . Stress: Rather much  Relationships  . Social connections    Talks on phone: More than three times a week    Gets together: More than three times a week    Attends religious service: 1 to 4 times per year    Active member of club or organization: Yes    Attends meetings of clubs or organizations: 1 to 4 times per year    Relationship status: Divorced  . Intimate partner  violence    Fear of current or ex partner: No    Emotionally abused: No    Physically abused: No    Forced sexual activity: No  Other Topics Concern  . 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 Wt Readings from Last 3 Encounters:  03/10/19 191 lb 12.8 oz (87 kg)  02/27/19 192 lb (87.1 kg)  02/11/19 192 lb 3.2 oz (87.2 kg)     Patient is in no distress; well developed, nourished and groomed; neck is supple  CARDIOVASCULAR:  Examination of carotid arteries is normal; no carotid bruits  Regular rate and rhythm, no murmurs  Examination of peripheral vascular system by observation and palpation is normal  EYES:  Ophthalmoscopic exam of optic discs and posterior segments is normal; no papilledema or hemorrhages  No exam data present  MUSCULOSKELETAL:  Gait, strength, tone, movements noted in Neurologic exam below  NEUROLOGIC: MENTAL STATUS:  No flowsheet data found.  awake, alert, oriented to person, place and time  recent and remote memory intact  normal attention and concentration  language fluent, comprehension intact, naming intact  fund of knowledge appropriate  CRANIAL NERVE:   2nd - no papilledema on fundoscopic exam  2nd, 3rd, 4th, 6th - pupils equal and reactive to light, visual fields full to confrontation, extraocular muscles intact, no nystagmus  5th - facial sensation symmetric  7th - facial strength symmetric  8th - hearing intact  9th - palate elevates symmetrically, uvula midline  11th - shoulder shrug symmetric  12th -  tongue protrusion midline  MOTOR:   normal bulk and tone, full strength in the BUE, BLE  SENSORY:   normal and symmetric to light touch, temperature, vibration  COORDINATION:   finger-nose-finger, fine finger movements normal  REFLEXES:   deep tendon reflexes present and symmetric  GAIT/STATION:   narrow based gait     DIAGNOSTIC DATA (LABS, IMAGING, TESTING) - I reviewed patient records, labs, notes, testing and imaging myself where available.  Lab Results  Component Value Date   WBC 9.4 01/19/2019   HGB 13.5 01/19/2019   HCT 43.2 01/19/2019   MCV 84.7 01/19/2019   PLT 226 01/19/2019      Component Value Date/Time   NA 138 01/19/2019 0329   K 3.8 01/19/2019 0329   CL 105 01/19/2019 0329   CO2 26 01/19/2019 0329   GLUCOSE 122 (H) 01/19/2019 0329   BUN 12 01/19/2019 0329   CREATININE 0.69 01/19/2019 0329   CREATININE 0.78 08/16/2017 1026   CALCIUM 9.1 01/19/2019 0329   PROT 6.8 01/19/2019 0329   ALBUMIN 3.5 01/19/2019 0329   AST 28 01/19/2019 0329   ALT 34 01/19/2019 0329   ALKPHOS 95 01/19/2019 0329   BILITOT 0.4 01/19/2019 0329   GFRNONAA >60 01/19/2019 0329   GFRNONAA 77 08/16/2017 1026   GFRAA >60 01/19/2019 0329   GFRAA 89 08/16/2017 1026   Lab Results  Component Value Date   CHOL 203 (H) 08/16/2017   HDL 57 01/15/2019   LDLCALC 85 01/15/2019   TRIG 64 08/16/2017   CHOLHDL 3.6 08/16/2017   Lab Results  Component Value Date   HGBA1C 5.9 01/15/2019   HGBA1C 5.9 01/15/2019   No results found for: VITAMINB12 Lab Results  Component Value Date   TSH 0.34 (L) 02/27/2019    01/19/19 CT head [I reviewed images myself and agree with interpretation. -VRP]  - No acute intracranial abnormality. - No acute bony or soft tissue abnormality  in the face.   ASSESSMENT AND PLAN  73 y.o. year old female here with new onset right sided headaches, right dental pain, right ear pain, sinus congestion, allergies since July 2020.  Symptoms may have been  exacerbated after her dental procedures.  Also had medication adjustments, Synthroid adjustment, prednisone, anxiety, sinus issues and allergy issues around the same time.   Dx: post-dental procedure headaches; tension HA; sinus HA  1. Right-sided headache     PLAN:  - monitor symptoms over next 4-6 weeks - may consider amitriptyline, gabapentin, sertraline in future (for symptom control); patient would like to hold off for right now  Return for pending if symptoms worsen or fail to improve.    Penni Bombard, MD [id number], 0000000 AM Certified in Neurology, Neurophysiology and Neuroimaging  [facility] [address] [place],  [address] [phone]

## 2019-03-13 ENCOUNTER — Encounter: Payer: Medicare Other | Admitting: Allergy & Immunology

## 2019-03-19 ENCOUNTER — Telehealth: Payer: Self-pay

## 2019-03-19 NOTE — Telephone Encounter (Signed)
Patient is calling because she has had to stop taking the Xyzal for her penicillin testing on Friday. She is now having issues sleeping and tells me that she only had 4 hours of sleep last night. Kara [name] would like to know if you have any suggestions that would help her with her sleeping problem.

## 2019-03-20 NOTE — Telephone Encounter (Signed)
Called patient and relayed the message. Patient verbalized understanding.

## 2019-03-20 NOTE — Telephone Encounter (Signed)
I would recommend adding on melatonin (available over the counter).   Salvatore Marvel, MD [facility] [place] [facility] [place]

## 2019-03-21 ENCOUNTER — Other Ambulatory Visit: Payer: Self-pay

## 2019-03-21 ENCOUNTER — Ambulatory Visit (INDEPENDENT_AMBULATORY_CARE_PROVIDER_SITE_OTHER): Payer: Medicare Other | Admitting: Allergy & Immunology

## 2019-03-21 ENCOUNTER — Encounter: Payer: Self-pay | Admitting: Allergy & Immunology

## 2019-03-21 VITALS — BP 112/60 | HR 98 | Temp 98.1°F | Resp 18

## 2019-03-21 DIAGNOSIS — Z889 Allergy status to unspecified drugs, medicaments and biological substances status: Secondary | ICD-10-CM

## 2019-03-21 NOTE — Progress Notes (Signed)
FOLLOW UP  Date of Service/Encounter:  03/21/19   Assessment:   Anaphylactic shock due to food  Chronic rhinitis   Drug allergy (prednisone, penicillin, NSAIDs) - mostly intolerances  Negative penicillin skin testing today   Plan/Recommendations:   1. Drug allergy - Testing was negative to the penicillin testing today. - This has excellent negative predictive value. - There is a 99% change you are going to pass the challenge to the drug in the clinic. - Make an appointment for a challenge, next available.  - We had a long conversation with her about the predictive value of the testing.   2. Return in about 4 weeks (around 04/18/2019), or PENICILLIN CHALLENGE. This can be an in-person, a virtual Webex or a telephone follow up visit.   Subjective:   Kara [name] is a 73 y.o. female presenting today for follow up of  Chief Complaint  Patient presents with  . Food/Drug Challenge    Pen.    Kara [name] has a history of the following: Patient Active Problem List   Diagnosis Date Noted  . 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 Urination pain 01/16/2019  . Vaginal discharge 01/16/2019  . Anxiety 01/12/2019  . Sinusitis 01/06/2019  . Headache in back of head 12/31/2018  . Candida infection of mouth 12/31/2018  . Splinter in skin 12/23/2018  . Insomnia due to stress 12/23/2018  . H/O swallowed foreign body 11/20/2018  . Change in bowel function 04/09/2018  . Breast pain, left 02/25/2018  . Prediabetes 08/30/2017  . Dyspepsia 09/29/2016  . Colon polyps 08/23/2016  . Family history of cancer 08/23/2016  . HLD (hyperlipidemia) 08/23/2016  . Obesity, Class II, BMI 35-39.9, with comorbidity 08/23/2016  . GERD (gastroesophageal reflux disease) 08/14/2016  . Hypothyroid 08/14/2016  . Perimenopausal vasomotor symptoms 02/11/2016  . 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  Kara [name]  is a 73 y.o. female presenting for skin testing.  She was last seen in September 2020.  At that time, we obtain labs to look for nut and seafood allergies.  We did provide her with an EpiPen and an anaphylaxis management plan.  For her rhinitis, we obtain labs to look for environmental allergies.  We stopped her Flonase and started Allegra and budesonide nasal rinses.  She was interested in addressing her drug allergies and we scheduled her today for penicillin testing.  Labs were negative to the majority of the foods aside from a very low IgE to coconut only.  Serum tryptase was normal.  She did have a positive IgE to dust mites and weeds.  In the interim, she has done well. She is clearly anxious today and is very worried about the testing today. She is also worried about getting the oral challenge. She is pacing during the visit today around the room and is almost diaphoretic over this. Her original reaction was decades ago and she did have throat tightening with this.   Allergic Rhinitis Symptom History: She has been off of the Xyzal since Monday night. She is feeling good aside from not being able to sleep. She is taking 1/2 a tablet at night. She is taking it because it makes her sleepy. She felt that the nose spray dried her out really badly. She has not used it in over one week or so. She is only using the nasal saline rinses as needed.   Otherwise, there have been no changes to her past medical history,  surgical history, family history, or social history.    Review of Systems  Constitutional: Negative.  Negative for chills, fever, malaise/fatigue and weight loss.  HENT: Negative.  Negative for congestion, ear discharge and ear pain.   Eyes: Negative for pain, discharge and redness.  Respiratory: Negative for cough, sputum production, shortness of breath and wheezing.   Cardiovascular: Negative.  Negative for chest pain and palpitations.  Gastrointestinal: Negative for abdominal pain,  heartburn, nausea and vomiting.  Skin: Negative.  Negative for itching and rash.  Neurological: Negative for dizziness and headaches.  Endo/Heme/Allergies: Negative for environmental allergies. Does not bruise/bleed easily.       Objective:   Blood pressure 112/60, pulse 98, temperature 98.1 F (36.7 C), temperature source Temporal, resp. rate 18, SpO2 98 %. There is no height or weight on file to calculate BMI.   Physical Exam:  Physical Exam  Constitutional: She appears well-developed.  Pleasant female. Cooperative with the exam. Very anxious.   HENT:  Head: Normocephalic and atraumatic.  Right Ear: Tympanic membrane, external ear and ear canal normal.  Left Ear: Tympanic membrane and ear canal normal.  Nose: No mucosal edema, rhinorrhea, nasal deformity or septal deviation. No epistaxis. Right sinus exhibits no maxillary sinus tenderness and no frontal sinus tenderness. Left sinus exhibits no maxillary sinus tenderness and no frontal sinus tenderness.  Mouth/Throat: Uvula is midline and oropharynx is clear and moist. Mucous membranes are not pale and not dry.  Eyes: Pupils are equal, round, and reactive to light. Conjunctivae and EOM are normal. Right eye exhibits no chemosis and no discharge. Left eye exhibits no chemosis and no discharge. Right conjunctiva is not injected. Left conjunctiva is not injected.  Cardiovascular: Normal rate, regular rhythm and normal heart sounds.  Respiratory: Effort normal and breath sounds normal. No accessory muscle usage. No tachypnea. No respiratory distress. She has no wheezes. She has no rhonchi. She has no rales. She exhibits no tenderness.  Moving air well in all lung fields.   Lymphadenopathy:    She has no cervical adenopathy.  Neurological: She is alert.  Skin: No abrasion, no petechiae and no rash noted. Rash is not papular, not vesicular and not urticarial. No erythema. No pallor.  No urticarial or eczematous lesions noted.    Psychiatric: She has a normal mood and affect.     Diagnostic studies:   Allergy Studies:     Percutaneous Penicillin Testing Control SPT: negative Histamine SPT: 2+ Pre-Pen SPT: negative Pen-G SPT: negative  Intradermal Penicillin Testing Control ID: negative  Pre-Pen ID: negative Pen-G (50 units/mL) ID: negative Pen-G (500 units/mL) ID: negative Pen-G (5000 units/mL) ID: negative  Testing was negative, therefore the patient will proceed with a penicillin challenge at a future visit.       Salvatore Marvel, MD  [facility]

## 2019-03-21 NOTE — Patient Instructions (Addendum)
1. Drug allergy - Testing was negative to the penicillin testing today. - This has excellent negative predictive value. - There is a 99% change you are going to pass the challenge to the drug in the clinic. - Make an appointment for a challenge, next available.   2. Return in about 4 weeks (around 04/18/2019), or PENICILLIN CHALLENGE. This can be an in-person, a virtual Webex or a telephone follow up visit.   Please inform us of any Emergency Department visits, hospitalizations, or changes in symptoms. Call us before going to the ED for breathing or allergy symptoms since we might be able to fit you in for a sick visit. Feel free to contact us anytime with any questions, problems, or concerns.  It was a pleasure to see you again today!  Websites that have reliable patient information: 1. American Academy of Asthma, Allergy, and Immunology: www.aaaai.org 2. Food Allergy Research and Education (FARE): foodallergy.org 3. Mothers of Asthmatics: http://www.asthmacommunitynetwork.org 4. American College of Allergy, Asthma, and Immunology: www.acaai.org  "Like" [place] on Facebook and Instagram for our latest updates!      Make sure you are registered to vote! If you have moved or changed any of your contact information, you will need to get this updated before voting!  In some cases, you MAY be able to register to vote online: [email or web address]

## 2019-03-26 ENCOUNTER — Encounter: Payer: Self-pay | Admitting: Family Medicine

## 2019-03-26 ENCOUNTER — Telehealth: Payer: Self-pay

## 2019-03-26 NOTE — Telephone Encounter (Signed)
[facility]  Phone Number: [phone]        Good morning Dr Holly Bodily. I continue to lose weight without trying. I've lost 35 lbs in the last 4 months.  Please advise.

## 2019-03-26 NOTE — Telephone Encounter (Signed)
Patient is aware 

## 2019-03-26 NOTE — Telephone Encounter (Signed)
Schedule an appt at your convenience to discuss concerns

## 2019-03-27 ENCOUNTER — Ambulatory Visit (INDEPENDENT_AMBULATORY_CARE_PROVIDER_SITE_OTHER): Payer: Medicare Other | Admitting: Family Medicine

## 2019-03-27 ENCOUNTER — Other Ambulatory Visit: Payer: Self-pay

## 2019-03-27 ENCOUNTER — Other Ambulatory Visit: Payer: Self-pay | Admitting: Family Medicine

## 2019-03-27 VITALS — BP 126/79 | HR 87 | Temp 97.9°F | Ht 66.0 in | Wt 189.6 lb

## 2019-03-27 DIAGNOSIS — R519 Headache, unspecified: Secondary | ICD-10-CM | POA: Diagnosis not present

## 2019-03-27 DIAGNOSIS — F5102 Adjustment insomnia: Secondary | ICD-10-CM | POA: Diagnosis not present

## 2019-03-27 DIAGNOSIS — K219 Gastro-esophageal reflux disease without esophagitis: Secondary | ICD-10-CM | POA: Diagnosis not present

## 2019-03-27 DIAGNOSIS — R7303 Prediabetes: Secondary | ICD-10-CM | POA: Diagnosis not present

## 2019-03-27 DIAGNOSIS — F419 Anxiety disorder, unspecified: Secondary | ICD-10-CM | POA: Diagnosis not present

## 2019-03-27 DIAGNOSIS — M25552 Pain in left hip: Secondary | ICD-10-CM

## 2019-03-27 DIAGNOSIS — Z1231 Encounter for screening mammogram for malignant neoplasm of breast: Secondary | ICD-10-CM

## 2019-03-27 DIAGNOSIS — R634 Abnormal weight loss: Secondary | ICD-10-CM | POA: Insufficient documentation

## 2019-03-27 DIAGNOSIS — E890 Postprocedural hypothyroidism: Secondary | ICD-10-CM | POA: Diagnosis not present

## 2019-03-27 HISTORY — DX: Pain in left hip: M25.552

## 2019-03-27 HISTORY — DX: Abnormal weight loss: R63.4

## 2019-03-27 NOTE — Patient Instructions (Signed)
labwork  cologuard Left hip xray cxr Keep DEXA appt

## 2019-03-27 NOTE — Progress Notes (Signed)
Established Patient Office Visit  Subjective:  Patient ID: Kara [name], female    DOB: 1945-06-14  Age: 73 y.o. MRN: [id number]  CC:  Chief Complaint  Patient presents with  . Weight Loss    unintentional     HPI Kara [name] presents for weight loss over the last few months-greater than 30 lbs. Pt states she started at weight watchers over 220 lbs in April and has now lost over 30 lbs.  Pt would like work up for weight loss. New concern left hip pain-no trauma 10/18-weight 219lbs 10/19-weight 223lbs 2/20-weight 226lbs 7/20-weight 209lbs 8/20-weight 207lbs 9/20-weight 195lbs 10/20-weight 192lbs Pt with evaluation by endocrinology-03/06/19 Kara [name]  is a patient with the above medical history. she was diagnosed  with hyperthyroidism at approximate age of 30 years  which required I-131 thyroid ablation in environment.   -She was subsequently initiated on thyroid hormone supplement.  She is currently on levothyroxine 112 mcg p.o. daily before breakfast.  She reports compliance to her medication. -She does not have new complaints today.  -She describes steady weight gain, on and off hot flashes.  She did denies tremors, palpitations. Pt denies feeling nodules in neck, hoarseness, dysphagia/odynophagia, SOB with lying down. she denies family history of  thyroid disorders.  No family history of thyroid cancer.  Her father had head and neck tumor which did not appear to be related to thyroid cancer. PLAN:   Patient with long-standing RAI induced hypothyroidism, on levothyroxine therapy.  -Her previsit labs confirm slight over replacement, potentially will need a lower adjustment in her dose, however patient insists that she does not do too well on a lower dose of levothyroxine.  Given the upcoming cold season, she will be allowed to stay on her current dose of levothyroxine 112 mcg p.o. every morning.   She agrees to return for a visit with repeat thyroid function tests in  63-month    - We discussed about the correct intake of her thyroid hormone, on empty stomach at fasting, with water, separated by at least 30 minutes from breakfast and other medications,  and separated by more than 4 hours from calcium, iron, multivitamins, acid reflux medications (PPIs). -Patient is made aware of the fact that thyroid hormone replacement is needed for life, dose to be adjusted by periodic monitoring of thyroid function tests. -Due to absence of clinical goiter, no need for thyroid ultrasound.  4wk ago (02/27/19) 234mogo (01/15/19) 1y38yro (08/16/17) 89yr[age]yr (02/19/17) 89yr 80yr(12/15/16)    TSH 0.40 - 4.50 mIU/L 0.34Low   0.52 R  0.58  0.22Low   0.28Low  R, CM   02/06/19-evaluation by allergy and asthma: Anaphylactic shock due to food Chronic rhinitis  Drug allergy (prednisone, penicillin, NSAIDs) - mostly intolerances Plan: Anaphylactic shock due to food - We are going to get some labs work to look for nut and seafood allergies. - We will call you in 1-2 weeks with the results of the testing. - EpiPen training reviewed. - Anaphylaxis management plan provided. Chronic rhinitis - We will get some labs to look for environmental allergies. - We will call you in 1-2 weeks with the results of the testing. - Stop taking: fluticasone - Start taking: Allegra (fexofenadine) '180mg'$  table once daily + budesonide nasal saline rinses ONCE daily (see recipe below) - I know you are sensitive to new medications, but please give this a try to help control inflammation within the nasal cavity. - he nasal rinse will also help with  decreasing mucous production.  . Drug allergy - We can do testing to penicillin in the future. - This is the only drug for which we have validated testing. - Once the testing is negative, we will do a challenge in the office setting to make sure that you tolerate this.  - We can then address the other drug allergies once we have penicillin out of the way. -  THere is no other testing for drugs at this time.  . Return in about 2 months (around 04/08/2019), or PENICILLIN TESTING. This can be an in-person, a virtual Webex or a telephone follow up visit.   June 2020 patient was feeling well.  Around July 2020 patient received prednisone for knee pain and this resulted in hypertension and headaches.  In August 2020 she went to dentist for regular checkup was found to have some dental caries affecting #2 tooth.  This was treated with a filling, but then patient felt ongoing pain in that region radiating to her right ear and right scalp.  She went to endodontist and oral surgeon.  Ultimately this tooth was extracted.  Pain continued.  Around the same time patient was having problems with increased anxiety which she attributed to Synthroid medication adjustment and prednisone.  Patient was also feeling increasing allergy symptoms with swelling sensation in her eyes, nose, face.  Also with sinus pressure and sinus congestion symptoms.  She followed up with allergist and ENT for evaluation.  Patient went to the emergency room twice for evaluation and had CT scan of the head twice as well as CT maxillofacial sinuses, which were unremarkable. Now headaches occur 3 times per week, lasting for few hours at a time.  Tylenol helps reduce the symptoms.  Overall headaches have slightly improved.  No nausea or vomiting.  No sensitive light or sound.  No prior similar headaches.  No history of migraine headaches.Patient has felt very anxious during this time.  She has prior history of anxiety.  Her sleep quality has been affected.Patient is very sensitive to medications. Right sided headache- She does not want to try additional medication. Recommendation:- monitor symptoms over next 4-6 weeks - may consider amitriptyline, gabapentin, sertraline in future (for symptom control); patient would like to hold off for right now D x: post-dental procedure headaches; tension HA; sinus HA  7/20 ENT-Kara [name] is a 73 y.o. female presenting today with a history of GERD, hyperplastic colon polyps and due for surveillance in 2021, last EGD in 2011 with esophagitis, gastritis, no H.pylori, small bowel biopsies negative for celiac. On Sunday lost a "threader" used for flossing while flossing her teeth. Had felt like something was on the back of her tongue. Monday evening back of mouth was sore. Back of throat feels better now. Still sensitive. Yesterday started having RUQ discomfort/soreness, took gaviscon and now improved but still present. Yesterday for breakfast had cheerios and milk, dinner had lasagna. Monday, day before had [employer] chicken, spicy.  Gallbladder absent. Was told to eat bread at the ED. Has eaten a lot of bread for a few days. Has been on omeprazole for a few weeks after weaning off recently. Goes on and off depending no how she feels. Feels like omeprazole does a good job. No dysphagia. Soft tissue xray of neck without foreign body.    Past Surgical History:  Procedure Laterality Date  . ABDOMINAL HYSTERECTOMY     bleeding. fibroids  . CESAREAN SECTION    . CHOLECYSTECTOMY    . COLONOSCOPY  2011   [place]: two 3-4 mm sessile polyps, hyperplastic, sigmoid diverticula, TI appeared normal.   . ESOPHAGOGASTRODUODENOSCOPY  2011   [place]: non-bleeding erosive gastropathy, normal duodenum. path: unremarkable duodenum, negative H.pylori, minimal esophagitis   . TONSILLECTOMY  1975    Family History  Problem Relation Age of Onset  . Cancer Maternal Grandmother        breast  . Breast cancer Maternal Grandmother   . Cancer Maternal Grandfather   . Cancer Paternal Grandmother   . Cancer Paternal Grandfather   . Breast cancer Paternal Grandfather   . Arthritis Mother   . Cancer Mother        breast  . COPD Mother   . Breast cancer Mother   . Cancer Father        throat  . Stroke Father   . Cancer Maternal Aunt [id number] [place], cervical cancer   . Breast cancer Maternal Aunt   . Breast cancer Paternal Aunt   . Colon cancer Neg Hx   . Allergic rhinitis Neg Hx   . Angioedema Neg Hx   . Asthma Neg Hx   . Atopy Neg Hx   . Eczema Neg Hx   . Urticaria Neg Hx   . Immunodeficiency Neg Hx     Social History   Socioeconomic History  . Marital status: Divorced    Spouse name: Not on file  . Number of children: 2  . Years of education: 3  . Highest education level: Not on file  Occupational History  . 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 Financial resource strain: Not hard at all  . Food insecurity    Worry: Never true    Inability: Never true  . Transportation needs    Medical: No    Non-medical: No  Tobacco Use  . Smoking status: Former Research scientist (life sciences)  . Smokeless tobacco: Never Used  . Tobacco comment: quit 2002  Substance and Sexual Activity  . Alcohol use: No  . Drug use: No  . Sexual activity: Not Currently    Birth control/protection: Surgical    Comment: hyst  Lifestyle  . Physical activity    Days per week: 0 days    Minutes per session: 0 min  . Stress: Rather much  Relationships  . Social connections    Talks on phone: More than three times a week    Gets together: More than three times a week    Attends religious service: 1 to 4 times per year    Active member of club or organization: Yes    Attends meetings of clubs or organizations: 1 to 4 times per year    Relationship status: Divorced  . Intimate partner violence    Fear of current or ex partner: No    Emotionally abused: No    Physically abused: No    Forced sexual activity: No  Other Topics Concern  . 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 budesonide (PULMICORT) 0.5 MG/2ML nebulizer solution 1 tube mixed as directed for nasal irrigation once daily.  60 mL 5  . EPINEPHrine 0.3 mg/0.3 mL IJ SOAJ injection Inject 0.3 mLs (0.3 mg total) into the skin as needed. 1 Device 1  . estradiol (VIVELLE-DOT) 0.0375 MG/24HR Place 1 patch onto the skin 2 (two) times a week. 8 patch 6  . levocetirizine (XYZAL) 5 MG tablet Take 2.5 mg by mouth every evening.     Marland Kitchen SYNTHROID 112 MCG tablet Take 1 tablet (112 mcg total) by mouth daily. 90 tablet 1   No facility-administered medications prior to visit.     Allergies  Allergen Reactions  . Other Anaphylaxis    Allergic to seafood and nuts  . Prednisone   . Ibuprofen Other (See Comments)    Uncertain - headache  . Naproxen Other (See Comments)    headache  . Penicillins Swelling    ROS Review of Systems  Constitutional: Positive for fatigue and unexpected weight change. Negative for fever.  HENT: Positive for congestion, dental problem and trouble swallowing. Negative for hearing loss.   Respiratory: Negative.   Cardiovascular: Negative.   Gastrointestinal:       GERD  Endocrine:       Hyperthyroid  Genitourinary: Negative for flank pain.  Skin: Negative.   Neurological: Positive for headaches.  Psychiatric/Behavioral: Positive for agitation. The patient is nervous/anxious.       Objective:    Physical Exam  Constitutional: She is oriented to person, place, and time. She appears well-developed and well-nourished. She appears distressed.  HENT:  Right Ear: External ear normal.  Left Ear: External ear normal.  Nose: Nose normal.  Mouth/Throat: Oropharynx is clear and moist. No oropharyngeal exudate.  Eyes: Conjunctivae are normal.  Neck: Normal range of motion. Neck supple.  Cardiovascular: Normal rate, regular rhythm and normal heart sounds.  Pulmonary/Chest: Effort normal and breath sounds normal.  Musculoskeletal:        General: Tenderness present.      Left hip: She exhibits tenderness.       Legs:  Neurological: She is alert and oriented to person, place, and time.    BP 126/79 (BP Location: Right Arm, Patient Position: Sitting, Cuff Size: Normal)   Pulse 87   Temp 97.9 F (36.6 C) (Oral)   Ht '5\' 6"'$  (1.676 m)   Wt 189 lb 9.6 oz (86 kg)   SpO2 98%   BMI 30.60 kg/m  Wt Readings from Last 3 Encounters:  03/27/19 189 lb 9.6 oz (86 kg)  03/10/19 191 lb 12.8 oz (87 kg)  02/27/19 192 lb (87.1 kg)     Health Maintenance Due  Topic Date Due  . FOOT EXAM  07/19/1955  . PNA vac Low Risk Adult (2 of 2 - PPSV23) 07/05/2017  . OPHTHALMOLOGY EXAM  06/01/2018  . URINE MICROALBUMIN  08/17/2018  . INFLUENZA VACCINE  12/14/2018   Lab Results  Component Value Date   TSH 0.34 (L) 02/27/2019   Lab Results  Component Value Date   WBC 9.4 01/19/2019   HGB 13.5 01/19/2019   HCT 43.2 01/19/2019   MCV 84.7 01/19/2019   PLT 226 01/19/2019   Lab Results  Component Value Date   NA 138 01/19/2019   K 3.8 01/19/2019   CO2 26 01/19/2019   GLUCOSE 122 (H) 01/19/2019   BUN 12 01/19/2019   CREATININE 0.69 01/19/2019   BILITOT 0.4 01/19/2019   ALKPHOS 95 01/19/2019   AST 28 01/19/2019   ALT 34 01/19/2019   PROT 6.8 01/19/2019   ALBUMIN 3.5 01/19/2019  CALCIUM 9.1 01/19/2019   ANIONGAP 7 01/19/2019   Lab Results  Component Value Date   CHOL 203 (H) 08/16/2017   Lab Results  Component Value Date   HDL 57 01/15/2019   Lab Results  Component Value Date   LDLCALC 85 01/15/2019   Lab Results  Component Value Date   TRIG 64 08/16/2017   Lab Results  Component Value Date   CHOLHDL 3.6 08/16/2017   Lab Results  Component Value Date   HGBA1C 5.9 01/15/2019   HGBA1C 5.9 01/15/2019      Assessment & Plan:  1. Insomnia due to stress No current medication  2. Hypothyroidism following radioiodine therapy Over replacement-d/w pt -one likely cause of weight loss is over replacement-pt declined to stay on lower dose as she  felt the lower dose made symptoms worse 3. Prediabetes A1c-5.9%  4. Gastroesophageal reflux disease without esophagitis No current medication for reflex 5. Anxiety No meds currently  6. Headache in back of head No meds currently-seen by neurology  7. Weight loss, unintentional Repeat all labwork-cbc, cmp,  ESR, HIV , Hep C, CXR, cologuard-neg heme cards 2018, 2011 colonoscopy negative, pt with episodic smoker-does not quality for CT lung based on pack years -1 pk q 2-3 days. Thyroid studies completed-over-replaced-pt declined to reduce dose. Pt understands she is over replaced on thyroid-pt on weight watchers in April, pt with dental pain over the summer.  Weight continues to decline. No known infectious disease risk  Left hip pain-xray-no injury-tenderness to palpation  Mammogram scheduled Cologuard ordered    Kara Beat, MD

## 2019-03-31 ENCOUNTER — Other Ambulatory Visit: Payer: Self-pay

## 2019-03-31 ENCOUNTER — Ambulatory Visit (HOSPITAL_COMMUNITY)
Admission: RE | Admit: 2019-03-31 | Discharge: 2019-03-31 | Disposition: A | Discharge status: Home / Self Care | Payer: Medicare Other | Source: Ambulatory Visit | Attending: Internal Medicine | Admitting: Internal Medicine

## 2019-03-31 DIAGNOSIS — M85852 Other specified disorders of bone density and structure, left thigh: Secondary | ICD-10-CM | POA: Diagnosis not present

## 2019-03-31 DIAGNOSIS — Z780 Asymptomatic menopausal state: Secondary | ICD-10-CM

## 2019-04-07 ENCOUNTER — Telehealth: Payer: Self-pay

## 2019-04-07 DIAGNOSIS — Z708 Other sex counseling: Secondary | ICD-10-CM | POA: Diagnosis not present

## 2019-04-07 DIAGNOSIS — R634 Abnormal weight loss: Secondary | ICD-10-CM | POA: Diagnosis not present

## 2019-04-07 NOTE — Telephone Encounter (Signed)
Labs reordered for [facility].

## 2019-04-08 ENCOUNTER — Telehealth: Payer: Self-pay

## 2019-04-08 LAB — CBC WITH DIFFERENTIAL/PLATELET
Absolute Monocytes: 488 cells/uL (ref 200–950)
Basophils Absolute: 52 cells/uL (ref 0–200)
Basophils Relative: 0.8 %
Eosinophils Absolute: 163 cells/uL (ref 15–500)
Eosinophils Relative: 2.5 %
HCT: 41.8 % (ref 35.0–45.0)
Hemoglobin: 13.4 g/dL (ref 11.7–15.5)
Lymphs Abs: 2379 cells/uL (ref 850–3900)
MCH: 26.2 pg — ABNORMAL LOW (ref 27.0–33.0)
MCHC: 32.1 g/dL (ref 32.0–36.0)
MCV: 81.6 fL (ref 80.0–100.0)
MPV: 10.6 fL (ref 7.5–12.5)
Monocytes Relative: 7.5 %
Neutro Abs: 3419 cells/uL (ref 1500–7800)
Neutrophils Relative %: 52.6 %
Platelets: 204 10*3/uL (ref 140–400)
RBC: 5.12 10*6/uL — ABNORMAL HIGH (ref 3.80–5.10)
RDW: 14.4 % (ref 11.0–15.0)
Total Lymphocyte: 36.6 %
WBC: 6.5 10*3/uL (ref 3.8–10.8)

## 2019-04-08 LAB — COMPLETE METABOLIC PANEL WITH GFR
AG Ratio: 1.2 (calc) (ref 1.0–2.5)
ALT: 18 U/L (ref 6–29)
AST: 17 U/L (ref 10–35)
Albumin: 3.5 g/dL — ABNORMAL LOW (ref 3.6–5.1)
Alkaline phosphatase (APISO): 85 U/L (ref 37–153)
BUN: 10 mg/dL (ref 7–25)
CO2: 26 mmol/L (ref 20–32)
Calcium: 9.2 mg/dL (ref 8.6–10.4)
Chloride: 106 mmol/L (ref 98–110)
Creat: 0.74 mg/dL (ref 0.60–0.93)
GFR, Est African American: 94 mL/min/{1.73_m2} (ref >=60)
GFR, Est Non African American: 82 mL/min/{1.73_m2} (ref >=60)
Globulin: 3 g/dL (calc) (ref 1.9–3.7)
Glucose, Bld: 103 mg/dL — ABNORMAL HIGH (ref 65–99)
Potassium: 4.2 mmol/L (ref 3.5–5.3)
Sodium: 140 mmol/L (ref 135–146)
Total Bilirubin: 0.5 mg/dL (ref 0.2–1.2)
Total Protein: 6.5 g/dL (ref 6.1–8.1)

## 2019-04-08 LAB — SEDIMENTATION RATE: Sed Rate: 9 mm/h (ref 0–30)

## 2019-04-08 LAB — HEPATITIS C ANTIBODY
Hepatitis C Ab: NONREACTIVE
SIGNAL TO CUT-OFF: 0.01 (ref <1.00)

## 2019-04-08 LAB — HIV ANTIBODY (ROUTINE TESTING W REFLEX): HIV 1&2 Ab, 4th Generation: NONREACTIVE

## 2019-04-08 LAB — HEPATITIS B SURFACE ANTIBODY,QUALITATIVE: Hep B S Ab: NONREACTIVE

## 2019-04-08 NOTE — Telephone Encounter (Signed)
Consider behavioral health referral due to difficulty with sleep due to anxiety. Suggestions for possible treatment needed  from specialist since previous medications have not been helpful.

## 2019-04-08 NOTE — Telephone Encounter (Signed)
Patient is asking what is next since her labs are normal, she states she lost another pound today, please advise?

## 2019-04-14 NOTE — Telephone Encounter (Signed)
Patient is aware of recommendation 

## 2019-04-17 ENCOUNTER — Ambulatory Visit (INDEPENDENT_AMBULATORY_CARE_PROVIDER_SITE_OTHER): Payer: Medicare Other | Admitting: Allergy & Immunology

## 2019-04-17 ENCOUNTER — Encounter: Payer: Self-pay | Admitting: Allergy & Immunology

## 2019-04-17 ENCOUNTER — Other Ambulatory Visit: Payer: Self-pay

## 2019-04-17 VITALS — BP 132/74 | HR 102 | Temp 98.0°F | Resp 18

## 2019-04-17 DIAGNOSIS — Z889 Allergy status to unspecified drugs, medicaments and biological substances status: Secondary | ICD-10-CM

## 2019-04-17 NOTE — Patient Instructions (Addendum)
1. Drug allergy - You passed your drug challenge to penicillin today. - We will remove penicillin from your list of drug allergies today. - Consider doing an NSAID (ibuprofen) challenge.   2. Return in about 6 months (around 10/16/2019). This can be an in-person, a virtual Webex or a telephone follow up visit.   Please inform us of any Emergency Department visits, hospitalizations, or changes in symptoms. Call us before going to the ED for breathing or allergy symptoms since we might be able to fit you in for a sick visit. Feel free to contact us anytime with any questions, problems, or concerns.  It was a pleasure to see you again today!  Websites that have reliable patient information: 1. American Academy of Asthma, Allergy, and Immunology: www.aaaai.org 2. Food Allergy Research and Education (FARE): foodallergy.org 3. Mothers of Asthmatics: http://www.asthmacommunitynetwork.org 4. American College of Allergy, Asthma, and Immunology: www.acaai.org  "Like" [place] on Facebook and Instagram for our latest updates!      Make sure you are registered to vote! If you have moved or changed any of your contact information, you will need to get this updated before voting!  In some cases, you MAY be able to register to vote online: [email or web address]

## 2019-04-17 NOTE — Progress Notes (Signed)
FOLLOW UP  Date of Service/Encounter:  04/17/19   Assessment:   Anaphylactic shock due to food  Chronic rhinitis   Drug allergy(prednisone, penicillin, NSAIDs) - mostly intolerances  Negative penicillin skin testing today  Plan/Recommendations:   1. Drug allergy - You passed your drug challenge to penicillin today. - We will remove penicillin from your list of drug allergies today. - Consider doing an NSAID (ibuprofen) challenge.   2. Return in about 6 months (around 10/16/2019). This can be an in-person, a virtual Webex or a telephone follow up visit.    Subjective:   Kara [name] is a 73 y.o. female presenting today for follow up of  Chief Complaint  Patient presents with  . Food/Drug Challenge    PCN    Kara [name] has a history of the following: Patient Active Problem List   Diagnosis Date Noted  . Weight loss, unintentional 03/27/2019  . Hip pain, acute, left 03/27/2019  . 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 Urination pain 01/16/2019  . Vaginal discharge 01/16/2019  . Anxiety 01/12/2019  . Sinusitis 01/06/2019  . Headache in back of head 12/31/2018  . Candida infection of mouth 12/31/2018  . Splinter in skin 12/23/2018  . Insomnia due to stress 12/23/2018  . H/O swallowed foreign body 11/20/2018  . Change in bowel function 04/09/2018  . Breast pain, left 02/25/2018  . Prediabetes 08/30/2017  . Dyspepsia 09/29/2016  . Colon polyps 08/23/2016  . Family history of cancer 08/23/2016  . HLD (hyperlipidemia) 08/23/2016  . Obesity, Class II, BMI 35-39.9, with comorbidity 08/23/2016  . GERD (gastroesophageal reflux disease) 08/14/2016  . Hypothyroid 08/14/2016  . Perimenopausal vasomotor symptoms 02/11/2016  . 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  Shaena is a 73 y.o. female presenting for a drug challenge.  She was last seen in November 2020 when we tested  her for penicillin.  Her testing was completely negative, so she presents for a challenge.  She also has a history of anaphylaxis to food.  She had a low IgE to coconut, but the rest of her testing was negative via blood.  Serum tryptase was normal.  She did have a positive IgE to dust mites and weeds.  Since last visit, she has done well.  Her original reaction consisted of throat tightening.  However, this was decades ago.  While she is nervous today, she seems much more at ease compared to previous times I have seen her.  Otherwise, there have been no changes to her past medical history, surgical history, family history, or social history.    Review of Systems  Constitutional: Negative.  Negative for chills, fever, malaise/fatigue and weight loss.  HENT: Negative.  Negative for congestion, ear discharge, ear pain and sore throat.   Eyes: Negative for pain, discharge and redness.  Respiratory: Negative for cough, sputum production, shortness of breath and wheezing.   Cardiovascular: Negative.  Negative for chest pain and palpitations.  Gastrointestinal: Negative for abdominal pain, diarrhea, heartburn, nausea and vomiting.  Skin: Negative.  Negative for itching and rash.  Neurological: Negative for dizziness and headaches.  Endo/Heme/Allergies: Negative for environmental allergies. Does not bruise/bleed easily.  Psychiatric/Behavioral: The patient is nervous/anxious.        Objective:   Blood pressure 132/74, pulse (!) 102, temperature 98 F (36.7 C), temperature source Temporal, resp. rate 18, SpO2 99 %. There is no height or weight on file to calculate BMI.   Physical  Exam: deferred since this was a drug challenge appointment only    Patient tolerated the following challenge with penicillin suspension (250mg /29mL):  1. 1% full dose of 500mg  (5mg  - 0.1 mL) 2. 5% full dose of 500mg  (25mg  - 0.5 mL) 3. 10% full dose of 500mg  (50mg  - 1.0 mL) 4. 90% full dose of 500mg  (450mg  - 9.0  mL)  Oral Challenge - 04/17/19 0900    Challenge Food/Drug  PCN    Lot #  if Applicable  [id number]    Food/Drug provided by  Office    Comments  Exp: 07/2020    BP  132/74    Pulse  102    Respirations  18    Lungs  99%    Time  0918    Dose  0.30ml    BP  120/78    Pulse  76    Respirations  18    Lungs  100%    Time  0948    Dose  0.19ml    BP  126/78    Pulse  78    Respirations  18    Lungs  100    Time  1020    Dose  1.21ml    BP  128/80    Pulse  82    Respirations  18    Lungs  Clear    Time  1050    Dose  49ml    BP  122/76    Pulse  84    Respirations  18    Lungs  96%          Salvatore Marvel, MD  [facility]

## 2019-04-23 ENCOUNTER — Encounter: Payer: Self-pay | Admitting: Family Medicine

## 2019-04-29 ENCOUNTER — Other Ambulatory Visit: Payer: Self-pay

## 2019-04-29 ENCOUNTER — Ambulatory Visit: Payer: Medicare Other

## 2019-04-29 ENCOUNTER — Telehealth (INDEPENDENT_AMBULATORY_CARE_PROVIDER_SITE_OTHER): Payer: Medicare Other | Admitting: Family Medicine

## 2019-04-29 DIAGNOSIS — M85859 Other specified disorders of bone density and structure, unspecified thigh: Secondary | ICD-10-CM

## 2019-04-29 DIAGNOSIS — Z23 Encounter for immunization: Secondary | ICD-10-CM

## 2019-04-29 NOTE — Patient Instructions (Signed)
Osteoporosis  Osteoporosis happens when your bones get thin and weak. This can cause your bones to break (fracture) more easily. You can do things at home to make your bones stronger. Follow these instructions at home:  Activity  Exercise as told by your doctor. Ask your doctor what activities are safe for you. You should do: ? Exercises that make your muscles work to hold your body weight up (weight-bearing exercises). These include tai chi, yoga, and walking. ? Exercises to make your muscles stronger. One example is lifting weights. Lifestyle  Limit alcohol intake to no more than 1 drink a day for nonpregnant women and 2 drinks a day for men. One drink equals 12 oz of beer, 5 oz of wine, or 1 oz of hard liquor.  Do not use any products that have nicotine or tobacco in them. These include cigarettes and e-cigarettes. If you need help quitting, ask your doctor. Preventing falls  Use tools to help you move around (mobility aids) as needed. These include canes, walkers, scooters, and crutches.  Keep rooms well-lit and free of clutter.  Put away things that could make you trip. These include cords and rugs.  Install safety rails on stairs. Install grab bars in bathrooms.  Use rubber mats in slippery areas, like bathrooms.  Wear shoes that: ? Fit you well. ? Support your feet. ? Have closed toes. ? Have rubber soles or low heels.  Tell your doctor about all of the medicines you are taking. Some medicines can make you more likely to fall. General instructions  Eat plenty of calcium and vitamin D. These nutrients are good for your bones. Good sources of calcium and vitamin D include: ? Some fatty fish, such as salmon and tuna. ? Foods that have calcium and vitamin D added to them (fortified foods). For example, some breakfast cereals are fortified with calcium and vitamin D. ? Egg yolks. ? Cheese. ? Liver.  Take over-the-counter and prescription medicines only as told by your  doctor.  Keep all follow-up visits as told by your doctor. This is important. Contact a doctor if:  You have not been tested (screened) for osteoporosis and you are: ? A woman who is age 33 or older. ? A man who is age 67 or older. Get help right away if:  You fall.  You get hurt. Summary  Osteoporosis happens when your bones get thin and weak.  Weak bones can break (fracture) more easily.  Eat plenty of calcium and vitamin D. These nutrients are good for your bones.  Tell your doctor about all of the medicines that you take. This information is not intended to replace advice given to you by your health care provider. Make sure you discuss any questions you have with your health care provider. Document Released: 07/24/2011 Document Revised: 04/13/2017 Document Reviewed: 02/23/2017 Elsevier Patient Education  2020 [employer]. Calcium; Vitamin D oral tablets What is this medicine? CALCIUM; VITAMIN D (KAL see um; VYE ta min D) is a vitamin supplement. It is used to prevent conditions of low calcium and vitamin D. This medicine may be used for other purposes; ask your health care provider or pharmacist if you have questions. COMMON BRAND NAME(S): Calcarb 600 with Vitamin D, Calcet Plus Vitamin D, Calcitrate + D, Calcium Citrate + D3 Maximum, Calcium Citrate Maximum with D, Caltrate, Caltrate 600+D, Citracal + D, Citracal MAXIMUM + D, Citracal Petites with Vitamin D, Citrus Calcium Plus D, OSCAL 500 + D, OSCAL Calcium + D3,  OSCAL Extra D3, Osteo-Poretical, Oysco 500 + D, Oysco D, Oystercal-D Calcium What should I tell my health care provider before I take this medicine? They need to know if you have any of these conditions:  constipation  dehydration  heart disease  high level of calcium or vitamin D in the blood  high level of phosphate in the blood  kidney disease  kidney stones  liver disease  parathyroid disease  sarcoidosis  stomach ulcer or obstruction  an  unusual or allergic reaction to calcium, vitamin D, tartrazine dye, other medicines, foods, dyes, or preservatives  pregnant or trying to get pregnant  breast-feeding How should I use this medicine? Take this medicine by mouth with a glass of water. Follow the directions on the label. Take with food or within 1 hour after a meal. Take your medicine at regular intervals. Do not take your medicine more often than directed. Talk to your pediatrician regarding the use of this medicine in children. While this medicine may be used in children for selected conditions, precautions do apply. Overdosage: If you think you have taken too much of this medicine contact a poison control center or emergency room at once. NOTE: This medicine is only for you. Do not share this medicine with others. What if I miss a dose? If you miss a dose, take it as soon as you can. If it is almost time for your next dose, take only that dose. Do not take double or extra doses. What may interact with this medicine? Do not take this medicine with any of the following medications:  ammonium chloride  methenamine This medicine may also interact with the following medications:  antibiotics like ciprofloxacin, gatifloxacin, tetracycline  captopril  delavirdine  diuretics  gabapentin  iron supplements  medicines for fungal infections like ketoconazole and itraconazole  medicines for seizures like ethotoin and phenytoin  mineral oil  mycophenolate  other vitamins with calcium, vitamin D, or minerals  quinidine  rosuvastatin  sucralfate  thyroid medicine This list may not describe all possible interactions. Give your health care provider a list of all the medicines, herbs, non-prescription drugs, or dietary supplements you use. Also tell them if you smoke, drink alcohol, or use illegal drugs. Some items may interact with your medicine. What should I watch for while using this medicine? Taking this medicine  is not a substitute for a well-balanced diet and exercise. Talk with your doctor or health care provider and follow a healthy lifestyle. Do not take this medicine with high-fiber foods, large amounts of alcohol, or drinks containing caffeine. Do not take this medicine within 2 hours of any other medicines. What side effects may I notice from receiving this medicine? Side effects that you should report to your doctor or health care professional as soon as possible:  allergic reactions like skin rash, itching or hives, swelling of the face, lips, or tongue  confusion  dry mouth  high blood pressure  increased hunger or thirst  increased urination  irregular heartbeat  metallic taste  muscle or bone pain  pain when urinating  seizure  unusually weak or tired  weight loss Side effects that usually do not require medical attention (report to your doctor or health care professional if they continue or are bothersome):  constipation  diarrhea  headache  loss of appetite  nausea, vomiting  stomach upset This list may not describe all possible side effects. Call your doctor for medical advice about side effects. You may report side  effects to FDA at 1-800-FDA-1088. Where should I keep my medicine? Keep out of the reach of children. Store at room temperature between 15 and 30 degrees C (59 and 86 degrees F). Protect from light. Keep container tightly closed. Throw away any unused medicine after the expiration date. NOTE: This sheet is a summary. It may not cover all possible information. If you have questions about this medicine, talk to your doctor, pharmacist, or health care provider.  2020 Elsevier/Gold Standard (2007-08-14 17:56:23)

## 2019-05-01 ENCOUNTER — Ambulatory Visit: Payer: Medicare Other

## 2019-05-03 NOTE — Progress Notes (Signed)
Virtual Visit via Telephone Note  I connected with Kara [name] on 05/03/19 at  8:00 AM EST by telephone and verified that I am speaking with the correct person using two identifiers.DOB/address  Location: Patient: home Provider: office   I discussed the limitations, risks, security and privacy concerns of performing an evaluation and management service by telephone and the availability of in person appointments. I also discussed with the patient that there may be a patient responsible charge related to this service. The patient expressed understanding and agreed to proceed.   History of Present Illness:   osteopenia noted on DEXA Observations/Objective:  Femur  Tscore -1.3 Assessment and Plan:  1. Osteopenia of neck of femur, unspecified laterality Ca +ViD Pt taking estrodiol Follow Up Instructions: Repeat in 2 years-DEXA   I discussed the assessment and treatment plan with the patient. The patient was provided an opportunity to ask questions and all were answered. The patient agreed with the plan and demonstrated an understanding of the instructions.    I provided 10 minutes of non-face-to-face time during this encounter.   Kara Tyminski Hannah Beat, MD

## 2019-05-16 DIAGNOSIS — Z923 Personal history of irradiation: Secondary | ICD-10-CM

## 2019-05-16 DIAGNOSIS — C50919 Malignant neoplasm of unspecified site of unspecified female breast: Secondary | ICD-10-CM

## 2019-05-16 HISTORY — DX: Personal history of irradiation: Z92.3

## 2019-05-16 HISTORY — PX: BREAST BIOPSY: SHX20

## 2019-05-16 HISTORY — DX: Malignant neoplasm of unspecified site of unspecified female breast: C50.919

## 2019-05-21 ENCOUNTER — Other Ambulatory Visit: Payer: Self-pay

## 2019-05-21 ENCOUNTER — Ambulatory Visit
Admission: RE | Admit: 2019-05-21 | Discharge: 2019-05-21 | Disposition: A | Discharge status: Home / Self Care | Payer: Medicare Other | Source: Ambulatory Visit | Attending: Family Medicine | Admitting: Family Medicine

## 2019-05-21 ENCOUNTER — Encounter: Payer: Self-pay | Admitting: Allergy & Immunology

## 2019-05-21 ENCOUNTER — Ambulatory Visit (INDEPENDENT_AMBULATORY_CARE_PROVIDER_SITE_OTHER): Payer: Medicare Other | Admitting: Allergy & Immunology

## 2019-05-21 VITALS — BP 134/76 | HR 88 | Temp 98.0°F | Resp 17

## 2019-05-21 DIAGNOSIS — Z889 Allergy status to unspecified drugs, medicaments and biological substances status: Secondary | ICD-10-CM | POA: Diagnosis not present

## 2019-05-21 DIAGNOSIS — Z1231 Encounter for screening mammogram for malignant neoplasm of breast: Secondary | ICD-10-CM | POA: Diagnosis not present

## 2019-05-21 MED ORDER — EPINEPHRINE 0.3 MG/0.3ML IJ SOAJ: 0.3000 mg | INTRAMUSCULAR | 2 refills | Status: DC | PRN

## 2019-05-21 NOTE — Progress Notes (Signed)
FOLLOW UP  Date of Service/Encounter:  05/21/19   Assessment:   Drug allergy (ibuprofen) - passed ibuprofen challenge today  Plan/Recommendations:   1. 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 - I would try ibuprofen next time that you need it.  - Call us over the course of the day with any concerning symptoms. - We will remove this from your allergy list.   2. Return for multiple challenges: shrimp, almond, peanut, walnut (cross reacts with pecan), cashew (cross reacts with pistachio).   Subjective:   Kara [name] is a 74 y.o. female presenting today for follow up of  Chief Complaint  Patient presents with  . Food/Drug Challenge    Kara [name] has a history of the following: Patient Active Problem List   Diagnosis Date Noted  . Weight loss, unintentional 03/27/2019  . Hip pain, acute, left 03/27/2019  . 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 Urination pain 01/16/2019  . Vaginal discharge 01/16/2019  . Anxiety 01/12/2019  . Sinusitis 01/06/2019  . Headache in back of head 12/31/2018  . Candida infection of mouth 12/31/2018  . Splinter in skin 12/23/2018  . Insomnia due to stress 12/23/2018  . H/O swallowed foreign body 11/20/2018  . Change in bowel function 04/09/2018  . Breast pain, left 02/25/2018  . Prediabetes 08/30/2017  . Dyspepsia 09/29/2016  . Colon polyps 08/23/2016  . Family history of cancer 08/23/2016  . HLD (hyperlipidemia) 08/23/2016  . Obesity, Class II, BMI 35-39.9, with comorbidity 08/23/2016  . GERD (gastroesophageal reflux disease) 08/14/2016  . Hypothyroid 08/14/2016  . Perimenopausal vasomotor symptoms 02/11/2016  . 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  Kara [name] is a 74 y.o. female presenting for a drug challenge.  She has a history of multiple food and drug allergies and she has been aggressively addressing these.  She did pass a  penicillin challenge about a month ago and wanted to pursue an ibuprofen challenge, which brings her in today.  She tells me that she only tends to take 1 tablet of anything at a time.  She does not remember the dose that she reacted to initially, since it was so many years ago.  However, she tells me she would probably just take 1 tablet.  Otherwise, there have been no changes to her past medical history, surgical history, family history, or social history.    Review of Systems  Constitutional: Negative.  Negative for chills, fever, malaise/fatigue and weight loss.  HENT: Negative.  Negative for congestion, ear discharge, ear pain and sore throat.   Eyes: Negative for pain, discharge and redness.  Respiratory: Negative for cough, sputum production, shortness of breath and wheezing.   Cardiovascular: Negative.  Negative for chest pain and palpitations.  Gastrointestinal: Negative for abdominal pain, heartburn, nausea and vomiting.  Skin: Negative.  Negative for itching and rash.  Neurological: Negative for dizziness and headaches.  Endo/Heme/Allergies: Negative for environmental allergies. Does not bruise/bleed easily.       Objective:   Blood pressure 134/76, pulse 88, temperature 98 F (36.7 C), temperature source Temporal, resp. rate 17, SpO2 99 %. There is no height or weight on file to calculate BMI.   Physical Exam: deferred since this was a drug challenge only   Oral Challenge - 05/21/19 0900    Challenge Food/Drug  Ibuprofen    Lot #  if Applicable  [id number]    Food/Drug provided by  Office  Comments  Exp. 07/2019    BP  134/76    Pulse  88    Respirations  17    Lungs  99%    Time  0925    Dose  1/8th (25mg )    BP  130/72    Pulse  87    Respirations  17    Lungs  100%    Time  1000    Dose  1/4th (50mg )    BP  128/70    Pulse  84    Respirations  17    Lungs  100%    Time  1035    Dose  1/2 (100mg )    BP  126/70    Pulse  86    Respirations  17     Lungs  100%        Open graded ibuprofen oral challenge: The patient was able to tolerate the challenge today without adverse signs or symptoms. Vital signs were stable throughout the challenge and observation period. She received multiple doses separated by 30 minutes, each of which was separated by vitals and a brief physical exam. She received the following doses: 1/8th tablet (25mg ), 1/4th tablet (50mg ), and 1/2 tablet (100mg ). She was monitored for 60 minutes following the last dose.      Salvatore Marvel, MD  [facility]

## 2019-05-21 NOTE — Patient Instructions (Addendum)
1. 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 - I would try ibuprofen next time that you need it.  - Call us over the course of the day with any concerning symptoms. - We will remove this from your allergy list.   2. Return for multiple challenges: shrimp, almond, peanut, walnut (cross reacts with pecan), cashew (cross reacts with pistachio).  Please inform us of any Emergency Department visits, hospitalizations, or changes in symptoms. Call us before going to the ED for breathing or allergy symptoms since we might be able to fit you in for a sick visit. Feel free to contact us anytime with any questions, problems, or concerns.  It was a pleasure to see you again today!  Websites that have reliable patient information: 1. American Academy of Asthma, Allergy, and Immunology: www.aaaai.org 2. Food Allergy Research and Education (FARE): foodallergy.org 3. Mothers of Asthmatics: http://www.asthmacommunitynetwork.org 4. American College of Allergy, Asthma, and Immunology: www.acaai.org  "Like" [place] on Facebook and Instagram for our latest updates!        Make sure you are registered to vote! If you have moved or changed any of your contact information, you will need to get this updated before voting!  In some cases, you MAY be able to register to vote online: [email or web address]

## 2019-05-22 ENCOUNTER — Other Ambulatory Visit: Payer: Self-pay | Admitting: Family Medicine

## 2019-05-22 DIAGNOSIS — R928 Other abnormal and inconclusive findings on diagnostic imaging of breast: Secondary | ICD-10-CM

## 2019-05-27 ENCOUNTER — Other Ambulatory Visit: Payer: Self-pay | Admitting: Family Medicine

## 2019-05-27 ENCOUNTER — Ambulatory Visit
Admission: RE | Admit: 2019-05-27 | Discharge: 2019-05-27 | Disposition: A | Discharge status: Home / Self Care | Payer: Medicare Other | Source: Ambulatory Visit | Attending: Family Medicine | Admitting: Family Medicine

## 2019-05-27 ENCOUNTER — Other Ambulatory Visit: Payer: Self-pay

## 2019-05-27 ENCOUNTER — Ambulatory Visit: Payer: Medicare Other | Admitting: Gastroenterology

## 2019-05-27 DIAGNOSIS — R928 Other abnormal and inconclusive findings on diagnostic imaging of breast: Secondary | ICD-10-CM

## 2019-05-27 DIAGNOSIS — R921 Mammographic calcification found on diagnostic imaging of breast: Secondary | ICD-10-CM

## 2019-05-30 ENCOUNTER — Ambulatory Visit: Payer: Medicare Other | Admitting: Gastroenterology

## 2019-06-04 ENCOUNTER — Ambulatory Visit
Admission: RE | Admit: 2019-06-04 | Discharge: 2019-06-04 | Disposition: A | Discharge status: Home / Self Care | Payer: Medicare Other | Source: Ambulatory Visit | Attending: Family Medicine | Admitting: Family Medicine

## 2019-06-04 ENCOUNTER — Other Ambulatory Visit: Payer: Self-pay

## 2019-06-04 DIAGNOSIS — R921 Mammographic calcification found on diagnostic imaging of breast: Secondary | ICD-10-CM | POA: Diagnosis not present

## 2019-06-04 DIAGNOSIS — R928 Other abnormal and inconclusive findings on diagnostic imaging of breast: Secondary | ICD-10-CM | POA: Diagnosis not present

## 2019-06-04 DIAGNOSIS — D0512 Intraductal carcinoma in situ of left breast: Secondary | ICD-10-CM | POA: Diagnosis not present

## 2019-06-05 ENCOUNTER — Encounter: Payer: Self-pay | Admitting: Family Medicine

## 2019-06-06 ENCOUNTER — Ambulatory Visit: Payer: Medicare Other | Attending: Internal Medicine

## 2019-06-06 ENCOUNTER — Telehealth: Payer: Self-pay | Admitting: Obstetrics and Gynecology

## 2019-06-06 DIAGNOSIS — Z23 Encounter for immunization: Secondary | ICD-10-CM | POA: Insufficient documentation

## 2019-06-06 NOTE — Telephone Encounter (Signed)
Pt called , left message tellng pt of Dr Rita Ohara book as resource.

## 2019-06-06 NOTE — Progress Notes (Signed)
   Covid-19 Vaccination Clinic  Name:  Kara [name]    MRN: [id number] DOB: 15-Jun-1945  06/06/2019  Kara [name] was observed post Covid-19 immunization for 30 minutes based on pre-vaccination screening without incidence. She was provided with Vaccine Information Sheet and instruction to access the V-Safe system.   Kara [name] was instructed to call 911 with any severe reactions post vaccine: Marland Kitchen Difficulty breathing  . Swelling of your face and throat  . A fast heartbeat  . A bad rash all over your body  . Dizziness and weakness    Immunizations Administered    Name Date Dose VIS Date Route   Pfizer COVID-19 Vaccine 06/06/2019 12:25 PM 0.3 mL 04/25/2019 Intramuscular   Manufacturer: [place]   Lot: [id number]   Micro: [id number]

## 2019-06-10 ENCOUNTER — Ambulatory Visit: Payer: Self-pay | Admitting: General Surgery

## 2019-06-10 ENCOUNTER — Encounter: Payer: Self-pay | Admitting: Family Medicine

## 2019-06-10 ENCOUNTER — Ambulatory Visit: Payer: Medicare Other | Admitting: Gastroenterology

## 2019-06-10 DIAGNOSIS — D0512 Intraductal carcinoma in situ of left breast: Secondary | ICD-10-CM | POA: Diagnosis not present

## 2019-06-11 ENCOUNTER — Encounter: Payer: Self-pay | Admitting: Adult Health

## 2019-06-11 ENCOUNTER — Other Ambulatory Visit: Payer: Self-pay

## 2019-06-11 ENCOUNTER — Other Ambulatory Visit: Payer: Self-pay | Admitting: General Surgery

## 2019-06-11 ENCOUNTER — Telehealth (HOSPITAL_COMMUNITY): Payer: Self-pay | Admitting: Psychiatry

## 2019-06-11 ENCOUNTER — Ambulatory Visit (HOSPITAL_COMMUNITY): Payer: Medicare Other | Admitting: Psychiatry

## 2019-06-11 ENCOUNTER — Telehealth (INDEPENDENT_AMBULATORY_CARE_PROVIDER_SITE_OTHER): Payer: Medicare Other | Admitting: Family Medicine

## 2019-06-11 DIAGNOSIS — Z171 Estrogen receptor negative status [ER-]: Secondary | ICD-10-CM

## 2019-06-11 DIAGNOSIS — D0512 Intraductal carcinoma in situ of left breast: Secondary | ICD-10-CM

## 2019-06-11 DIAGNOSIS — C50412 Malignant neoplasm of upper-outer quadrant of left female breast: Secondary | ICD-10-CM

## 2019-06-11 DIAGNOSIS — F419 Anxiety disorder, unspecified: Secondary | ICD-10-CM | POA: Diagnosis not present

## 2019-06-11 HISTORY — DX: Estrogen receptor negative status (ER-): Z17.1

## 2019-06-11 HISTORY — DX: Malignant neoplasm of upper-outer quadrant of left female breast: C50.412

## 2019-06-11 MED ORDER — ESCITALOPRAM OXALATE 5 MG PO TABS: 5.0000 mg | ORAL_TABLET | Freq: Every day | ORAL | 0 refills | Status: DC

## 2019-06-11 NOTE — Telephone Encounter (Signed)
Therapist attempted to contact patient twice via text through [email or web address] platform, no response.  Therapist then called patient and left message indicating attempt and requesting patient call office.

## 2019-06-11 NOTE — Progress Notes (Signed)
Virtual Visit via Telephone Note  I connected with Kara [name] on 06/11/19 at  4:00 PM EST by telephone and verified that I am speaking with the correct person using two identifiers.  Location: Patient: walking Provider: clinic   I discussed the limitations, risks, security and privacy concerns of performing an evaluation and management service by telephone and the availability of in person appointments. I also discussed with the patient that there may be a patient responsible charge related to this service. The patient expressed understanding and agreed to proceed.   History of Present Illness: Ductal cancer-diagnosis by stereotactic biopsy. Seen by surgeon yesterday for evaluation and pre-operative discussion. Pt to have lumpectomy plus radiation. No chemo needed. Pt with receptor NEGATIVE cancer. Surgeon wants pt to come off of estrogen for surgery. Pt with concern for menopausal symptoms. Previously tried to come off estrogen and re-started due to symptoms. Pt uses patches twice a week.  Pt with surgery date 2/23.    Observations/Objective: Ductal CA on pathology report  Assessment and Plan: 1. Ductal carcinoma in situ (DCIS) of left breast Pt advised surgery + radiation needed for treatment -scheduled for 2/23-advised to discontinue estrogen-taking patches twice a week. Pt to stop estrogen and start Lexapro 5mg  daily-rx-risk/benefit/side effects d/w pt  2. Anxiety Lexapro 5mg -rx  Follow Up Instructions: Follow up post operative to discuss medication-discussed hot flashes and mood swings off estrogen   I discussed the assessment and treatment plan with the patient. The patient was provided an opportunity to ask questions and all were answered. The patient agreed with the plan and demonstrated an understanding of the instructions.   The patient was advised to call back or seek an in-person evaluation if the symptoms worsen or if the condition fails to improve as anticipated.  I  provided 30 minutes of non-face-to-face time during this encounter.   Marcia Hartwell Hannah Beat, MD

## 2019-06-11 NOTE — Patient Instructions (Signed)
Start taking Lexapro 5mg  daily Stop estrogen patches  What are the signs or symptoms of menopause. Symptoms of this condition include:  Hot flashes.  Night sweats.  Changes in feelings about sex. This could be a decrease in sex drive or an increased comfort around your sexuality.  Vaginal dryness and thinning of the vaginal walls. This may cause painful intercourse.  Dryness of the skin and development of wrinkles.  Headaches.  Problems sleeping (insomnia).  Mood swings or irritability.  Memory problems.  Weight gain.  Hair growth on the face and chest.  Bladder infections or problems with urinating. eating and drinking  Eat a healthy, balanced diet that contains whole grains, lean protein, low-fat dairy, and plenty of fruits and vegetables.  Your health care provider may recommend adding more soy to your diet. Foods that contain soy include tofu, tempeh, and soy milk.  Eat plenty of foods that contain calcium and vitamin D for bone health. Items that are rich in calcium include low-fat milk, yogurt, beans, almonds, sardines, broccoli, and kale. Medicines  Take over-the-counter and prescription medicines only as told by your health care provider.  Talk with your health care provider before starting any herbal supplements. If prescribed, take vitamins and supplements as told by your health care provider. These may include: ? Calcium. Women age 1 and older should get 1,200 mg (milligrams) of calcium every day. ? Vitamin D. Women need 600-800 International Units of vitamin D each day. ? Vitamins B12 and B6. Aim for 50 micrograms of B12 and 1.5 mg of B6 each day.

## 2019-06-12 ENCOUNTER — Telehealth: Payer: Self-pay | Admitting: Hematology and Oncology

## 2019-06-12 NOTE — Telephone Encounter (Signed)
Received referrals from Dr. Marlou Starks for genetics and med onc. [name] has been scheduled for a virtual genetics visit on 2/1 at 1pm w/Karen and to see Dr. Lindi Adie on 3/9 at 1pm for med onc. Per pt she preferred to be seen after surgery.She's aware that her appt w/Dr. Lindi Adie is not virtual. I verified her email address and mobile number.

## 2019-06-13 ENCOUNTER — Encounter: Payer: Self-pay | Admitting: Allergy & Immunology

## 2019-06-13 ENCOUNTER — Ambulatory Visit (INDEPENDENT_AMBULATORY_CARE_PROVIDER_SITE_OTHER): Payer: Medicare Other | Admitting: Allergy & Immunology

## 2019-06-13 ENCOUNTER — Other Ambulatory Visit: Payer: Self-pay

## 2019-06-13 VITALS — BP 106/66 | HR 82 | Temp 97.4°F | Resp 18

## 2019-06-13 DIAGNOSIS — T7800XD Anaphylactic reaction due to unspecified food, subsequent encounter: Secondary | ICD-10-CM | POA: Diagnosis not present

## 2019-06-13 NOTE — Progress Notes (Signed)
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 - Call us over the course of the day with any concerning symptoms. - We will remove shrimp from your allergy list.   2. Return for multiple challenges: almond, peanut, walnut (cross reacts with pecan), cashew (cross reacts with pistachio).  3. Return in about 2 weeks (around 06/27/2019). This can be an in-person, a virtual Webex or a telephone follow up visit.   Subjective:   Kara [name] is a 74 y.o. female presenting today for follow up of  Chief Complaint  Patient presents with  . Food/Drug Challenge    Shrimp    Kara [name] has a history of the following: Patient Active Problem List   Diagnosis Date Noted  . Malignant neoplasm of upper-outer quadrant of left breast in female, estrogen receptor negative ([place]) 06/11/2019  . 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  . Weight loss, unintentional 03/27/2019  . Hip pain, acute, left 03/27/2019  . 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 Urination pain 01/16/2019  . Vaginal discharge 01/16/2019  . Anxiety 01/12/2019  . Sinusitis 01/06/2019  . Headache in back of head 12/31/2018  . Candida infection of mouth 12/31/2018  . Splinter in skin 12/23/2018  . Insomnia due to stress 12/23/2018  . H/O swallowed foreign body 11/20/2018  . Change in bowel function 04/09/2018  . Breast pain, left 02/25/2018  . Prediabetes 08/30/2017  . Dyspepsia 09/29/2016  . Colon polyps 08/23/2016  . Family history of cancer 08/23/2016  . HLD (hyperlipidemia) 08/23/2016  . Obesity, Class II, BMI 35-39.9, with comorbidity 08/23/2016  . GERD (gastroesophageal reflux disease) 08/14/2016  . Hypothyroid 08/14/2016    . Perimenopausal vasomotor symptoms 02/11/2016  . 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  Kara [name] is a 74 y.o. female presenting for a food challenge.  She presents today specifically for a shrimp challenge.  She also has reported allergies to tree nuts as well.  We have been slowly going through her allergy list and doing challenges to take them off.  She most recently passed ibuprofen challenge a couple of weeks ago.  Since last visit, she has done well.  She did bring in prescription and is a little bit nervous about the challenge today.   She was diagnosed with DCIS. She is going to have surgery and radiation.   Otherwise, there have been no changes to her past medical history, surgical history, family history, or social history.    Review of Systems  Constitutional: Negative.  Negative for fever, malaise/fatigue and weight loss.  HENT: Negative.  Negative for congestion, ear discharge and ear pain.   Eyes: Negative for pain, discharge and redness.  Respiratory: Negative for cough, sputum production, shortness of breath and wheezing.   Cardiovascular: Negative.  Negative for chest pain and palpitations.  Gastrointestinal: Negative for abdominal pain, constipation, diarrhea, heartburn, nausea and vomiting.  Skin: Negative.  Negative for itching and rash.  Neurological: Negative for dizziness and headaches.  Endo/Heme/Allergies: Negative for environmental allergies. Does not bruise/bleed easily.  Psychiatric/Behavioral: The patient is nervous/anxious.        Objective:   Blood pressure 106/66, pulse 82, temperature (!) 97.4 F (36.3 C), temperature  source Temporal, resp. rate 18, SpO2 98 %. There is no height or weight on file to calculate BMI.   Physical Exam: deferred since this was a challenge appointment only   Allergy Studies:     Open graded shrim oral challenge: The patient was able to tolerate the  challenge today without adverse signs or symptoms. Vital signs were stable throughout the challenge and observation period. She received multiple doses separated by 15 minutes, each of which was separated by vitals and a brief physical exam. She received the following doses: lip rub, 1/2 shrimp, 1 shrimp, 2 shrimp, 4 shrimp, and 8 shrimp. She was monitored for 60 minutes following the last dose.      Oral Challenge - 06/13/19 0900    Challenge Food/Drug  Shrimp    Food/Drug provided by  Patient    BP  106/66    Pulse  82    Respirations  18    Lungs  98    Time  0905    Dose  Lip Rub    Dose  1/2 Shrimp    Dose  1 shrimp    Dose  2 shrimp    Dose  4 shrimp    Dose  8 shrimp      Time of first dose: 9:05 AM Time of discharge: 11:50 AM     Salvatore Marvel, MD  [facility]

## 2019-06-13 NOTE — Patient Instructions (Addendum)
1. 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 - Call us over the course of the day with any concerning symptoms. - We will remove shrimp from your allergy list.   2. Return for multiple challenges: almond, peanut, walnut (cross reacts with pecan), cashew (cross reacts with pistachio).    3. Return in about 2 weeks (around 06/27/2019). This can be an in-person, a virtual Webex or a telephone follow up visit.   Please inform us of any Emergency Department visits, hospitalizations, or changes in symptoms. Call us before going to the ED for breathing or allergy symptoms since we might be able to fit you in for a sick visit. Feel free to contact us anytime with any questions, problems, or concerns.  It was a pleasure to see you again today! We are praying for your   Websites that have reliable patient information: 1. American Academy of Asthma, Allergy, and Immunology: www.aaaai.org 2. Food Allergy Research and Education (FARE): foodallergy.org 3. Mothers of Asthmatics: http://www.asthmacommunitynetwork.org 4. American College of Allergy, Asthma, and Immunology: www.acaai.org   COVID-19 Vaccine Information can be found at: ShippingScam.co.uk For questions related to vaccine distribution or appointments, please email [email or web address] or call [phone].     "Like" [place] on Facebook and Instagram for our latest updates!        Make sure you are registered to vote! If you have moved or changed any of your contact information, you will need to get this updated before voting!  In some cases, you MAY be able to register to vote online: [email or web address]

## 2019-06-16 ENCOUNTER — Encounter (HOSPITAL_COMMUNITY): Payer: Self-pay | Admitting: Psychiatry

## 2019-06-16 ENCOUNTER — Encounter: Payer: Self-pay | Admitting: Genetic Counselor

## 2019-06-16 ENCOUNTER — Ambulatory Visit (HOSPITAL_BASED_OUTPATIENT_CLINIC_OR_DEPARTMENT_OTHER): Payer: Medicare Other | Admitting: Genetic Counselor

## 2019-06-16 ENCOUNTER — Other Ambulatory Visit: Payer: Self-pay

## 2019-06-16 ENCOUNTER — Ambulatory Visit (INDEPENDENT_AMBULATORY_CARE_PROVIDER_SITE_OTHER): Payer: Medicare Other | Admitting: Psychiatry

## 2019-06-16 DIAGNOSIS — Z803 Family history of malignant neoplasm of breast: Secondary | ICD-10-CM

## 2019-06-16 DIAGNOSIS — Z171 Estrogen receptor negative status [ER-]: Secondary | ICD-10-CM

## 2019-06-16 DIAGNOSIS — F4323 Adjustment disorder with mixed anxiety and depressed mood: Secondary | ICD-10-CM | POA: Diagnosis not present

## 2019-06-16 DIAGNOSIS — C50412 Malignant neoplasm of upper-outer quadrant of left female breast: Secondary | ICD-10-CM

## 2019-06-16 DIAGNOSIS — Z8042 Family history of malignant neoplasm of prostate: Secondary | ICD-10-CM

## 2019-06-16 HISTORY — PX: BREAST LUMPECTOMY: SHX2

## 2019-06-16 NOTE — Progress Notes (Signed)
REFERRING PROVIDER: Maryruth Hancock, [address],  [place] [address]  PRIMARY PROVIDER:  Maryruth Hancock, MD  PRIMARY REASON FOR VISIT:  1. Malignant neoplasm of upper-outer quadrant of left breast in female, estrogen receptor negative ([place])   2. Family history of breast cancer   3. Family history of prostate cancer      HISTORY OF PRESENT ILLNESS:  I connected with  [name] on 06/16/2019 at 1 PM EDT by MyChart video conference and verified that I am speaking with the correct person using two identifiers.   Patient location: Home Provider location: [facility]  Kara [name], a 74 y.o. female, was seen for a [facility] cancer genetics consultation at the request of Dr. Holly Bodily due to a personal and family history of breast cancer.  Kara [name] presents to clinic today to discuss the possibility of a hereditary predisposition to cancer, genetic testing, and to further clarify her future cancer risks, as well as potential cancer risks for family members.   In January 2021, at the age of 33, Kara [name] was diagnosed with DCIS of the left breast. The treatment plan lumpectomy and radiation.      CANCER HISTORY:  Oncology History  Malignant neoplasm of upper-outer quadrant of left breast in female, estrogen receptor negative ([facility])  06/04/2019 Cancer Staging   Staging form: Breast, AJCC 8th Edition - Clinical stage from 06/04/2019: Stage 0 (cTis (DCIS), cN0, cM0, ER-, PR-) - Signed by Gardenia Phlegm, NP on 06/11/2019   06/11/2019 Initial Diagnosis   Malignant neoplasm of upper-outer quadrant of left breast in female, estrogen receptor negative ([facility])      RISK FACTORS:  Menarche was at age 37-12.  First live birth at age 84.  Ovaries intact: no.  Hysterectomy: yes.  Menopausal status: postmenopausal.  HRT use: 22 years years. Colonoscopy: yes; a couple polyps. Mammogram within the last year: yes. Number of breast biopsies: 1. Up to date with pelvic exams: yes. Any excessive  radiation exposure in the past: no  Past Medical History:  Diagnosis Date  . Allergy    food allergies, drug allergies  . Anemia   . Anxiety   . Cataract   . Diabetes mellitus without complication (H[facility])    no medication   . Family history of breast cancer   . Family history of prostate cancer   . GERD (gastroesophageal reflux disease)   . Hyperlipidemia   . Malignant neoplasm of upper-outer quadrant of left breast in female, estrogen receptor negative ([facility]) 06/11/2019  . Thyroid disease     Past Surgical History:  Procedure Laterality Date  . ABDOMINAL HYSTERECTOMY     bleeding. fibroids  . CESAREAN SECTION    . CHOLECYSTECTOMY    . COLONOSCOPY  2011   Kara [name]: two 3-4 mm sessile polyps, hyperplastic, sigmoid diverticula, TI appeared normal.   . ESOPHAGOGASTRODUODENOSCOPY  2011   Kara [name]: non-bleeding erosive gastropathy, normal duodenum. path: unremarkable duodenum, negative H.pylori, minimal esophagitis   . TONSILLECTOMY  1975    Social History   Socioeconomic History  . Marital status: Divorced    Spouse name: Not on file  . Number of children: 2  . Years of education: 10  . Highest education level: Not on file  Occupational History  . 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 Smoking status: Former Research scientist (life sciences)  . Smokeless tobacco: Never Used  . Tobacco comment: quit 2002  Substance and Sexual Activity  . Alcohol use: No  . Drug use: No  . Sexual activity: Not Currently    Birth control/protection: Surgical    Comment: hyst  Other Topics Concern  . 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 Difficulty of Paying Living Expenses: Not on file  Food Insecurity:    . Worried About Charity fundraiser in the Last Year: Not on file  . Ran Out of Food in the Last Year: Not on file  Transportation Needs:   . Lack of Transportation (Medical): Not on file  . Lack of Transportation (Non-Medical): Not on file  Physical Activity:   . Days of Exercise per Week: Not on file  . Minutes of Exercise per Session: Not on file  Stress:   . Feeling of Stress : Not on file  Social Connections:   . Frequency of Communication with Friends and Family: Not on file  . Frequency of Social Gatherings with Friends and Family: Not on file  . Attends Religious Services: Not on file  . Active Member of Clubs or Organizations: Not on file  . Attends Archivist Meetings: Not on file  . Marital Status: Not on file     FAMILY HISTORY:  We obtained a detailed, 4-generation family history.  Significant diagnoses are listed below: Family History  Problem Relation Age of Onset  . Breast cancer Maternal Grandmother        dx over 39  . Prostate cancer Maternal Grandfather   . Arthritis Mother   . COPD Mother   . Breast cancer Mother 74       second at age 67  . Cancer Father        throat  . Stroke Father   . Cancer Maternal Aunt [address] Lacks, cervical cancer  . Breast cancer Maternal Aunt        dx over 39  . Breast cancer Paternal Aunt        dx under 70  . Prostate cancer Maternal Uncle        dx over 64  . Cancer Other        father's maternal cousin was Henreietta Lacks, Cervical  . Cancer Maternal Uncle        unknown cancer  . Breast cancer Paternal Aunt        dx over 56  . Colon cancer Neg Hx   . Allergic rhinitis Neg Hx   . Angioedema Neg Hx   . Asthma Neg Hx   . Atopy Neg Hx   . Eczema Neg Hx   . Urticaria Neg Hx   . Immunodeficiency Neg Hx     The patient has two sons who are cancer free.  She has one brother who is cancer free.  Her mother is living and her father is deceased.    Her mother had breast cancer at 70 and  74.  She had five sister and five brothers.  One sister had breast cancer and two brothers had prostate cancer.  The grandmother had breast cancer and the grandfather had prostate cancer.  The patient's  father had two brothers and two sisters.  Both sisters had breast cancer, one under age 71.  His maternal first cousin was Ursula Beath who had cervical cancer under 29.  Ms. Ingram is unaware of previous family history of genetic testing for hereditary cancer risks. Patient's maternal ancestors are of African American descent, and paternal ancestors are of African American descent. There is no reported Ashkenazi Jewish ancestry. There is no known consanguinity.  GENETIC COUNSELING ASSESSMENT: Kara [name] is a 74 y.o. female with a personal and family history of breast cancer which is somewhat suggestive of a hereditary cancer syndrome and predisposition to cancer given the number of cases of breast cancer. We, therefore, discussed and recommended the following at today's visit.   DISCUSSION: We discussed that 5 - 10% of breast cancer is hereditary, with most cases associated with BRCA mutations.  There are other genes that can be associated with hereditary breast cancer syndromes.  We discussed that testing is beneficial for several reasons including knowing how to follow individuals after completing their treatment, identifying whether potential treatment options such as PARP inhibitors would be beneficial, and understand if other family members could be at risk for cancer and allow them to undergo genetic testing.   We reviewed the characteristics, features and inheritance patterns of hereditary cancer syndromes. We also discussed genetic testing, including the appropriate family members to test, the process of testing, insurance coverage and turn-around-time for results. We discussed the implications of a negative, positive and/or variant of uncertain significant result. In order to get genetic test  results in a timely manner so that Kara [name] can use these genetic test results for surgical decisions, we recommended Kara [name] pursue genetic testing for the STAT panel. Once complete, we recommend Kara [name] pursue reflex genetic testing to the common hereditary cancer gene panel. The Common Hereditary Gene Panel offered by [facility] includes sequencing and/or deletion duplication testing of the following 48 genes: APC, ATM, AXIN2, BARD1, BMPR1A, BRCA1, BRCA2, BRIP1, CDH1, CDK4, CDKN2A (p14ARF), CDKN2A (p16INK4a), CHEK2, CTNNA1, DICER1, EPCAM (Deletion/duplication testing only), GREM1 (promoter region deletion/duplication testing only), KIT, MEN1, MLH1, MSH2, MSH3, MSH6, MUTYH, NBN, NF1, NHTL1, PALB2, PDGFRA, PMS2, POLD1, POLE, PTEN, RAD50, RAD51C, RAD51D, RNF43, SDHB, SDHC, SDHD, SMAD4, SMARCA4. STK11, TP53, TSC1, TSC2, and VHL.  The following genes were evaluated for sequence changes only: SDHA and HOXB13 c.251G>A variant only.   Based on Ms. Frerking's personal and family history of cancer, she meets medical criteria for genetic testing. Despite that she meets criteria, she may still have an out of pocket cost.    PLAN: After considering the risks, benefits, and limitations, Kara [name] provided informed consent to pursue genetic testing and the blood sample was sent to [facility] for analysis of the common hereditary cancer panel. Results should be available within approximately 2-3 weeks' time, at which point they will be disclosed by telephone to Kara [name], as will any additional recommendations warranted by these results. Kara [name] will receive a summary of her genetic counseling visit and a copy of her results once available. This information will also be available in Epic.   Lastly, we encouraged Kara [name] to remain in contact with cancer genetics annually so that we can continuously update the family history and inform her of any changes in cancer genetics and testing that may be of benefit for this  family.   Kara [name] questions were answered to her satisfaction today. Our contact information was provided should additional questions or concerns  arise. Thank you for the referral and allowing [place] to share in the care of your patient.   Kara [name] P. Florene Glen, [place], [facility] Licensed, Insurance risk surveyor [name] phone: [phone]  The patient was seen for a total of 45 minutes in face-to-face genetic counseling.  This patient was discussed with Drs. Magrinat, Lindi Adie and/or Burr Medico who agrees with the above.    _______________________________________________________________________ For Office Staff:  Number of people involved in session: 1 Was an Intern/ student involved with case: no

## 2019-06-17 ENCOUNTER — Encounter (HOSPITAL_COMMUNITY): Payer: Self-pay | Admitting: Psychiatry
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
## 2019-06-24 ENCOUNTER — Ambulatory Visit: Payer: Medicare Other | Attending: Internal Medicine

## 2019-06-24 DIAGNOSIS — Z23 Encounter for immunization: Secondary | ICD-10-CM

## 2019-06-24 NOTE — Progress Notes (Signed)
   Covid-19 Vaccination Clinic  Name:  Kara [name]    MRN: [id number] DOB: 1946-01-14  06/24/2019  [name] was observed post Covid-19 immunization for 30 minutes based on pre-vaccination screening without incidence. She was provided with Vaccine Information Sheet and instruction to access the V-Safe system.   [name] was instructed to call 911 with any severe reactions post vaccine: Marland Kitchen Difficulty breathing  . Swelling of your face and throat  . A fast heartbeat  . A bad rash all over your body  . Dizziness and weakness    Immunizations Administered    Name Date Dose VIS Date Route   Pfizer COVID-19 Vaccine 06/24/2019 11:18 AM 0.3 mL 04/25/2019 Intramuscular   Manufacturer: [place]   Lot: [id number]   [place]: [id number]

## 2019-06-27 ENCOUNTER — Ambulatory Visit: Payer: Medicare Other

## 2019-07-01 ENCOUNTER — Encounter (HOSPITAL_BASED_OUTPATIENT_CLINIC_OR_DEPARTMENT_OTHER): Payer: Self-pay | Admitting: General Surgery

## 2019-07-01 ENCOUNTER — Encounter: Payer: Self-pay | Admitting: Family Medicine

## 2019-07-01 ENCOUNTER — Other Ambulatory Visit: Payer: Self-pay

## 2019-07-04 ENCOUNTER — Other Ambulatory Visit (HOSPITAL_COMMUNITY): Payer: Medicare Other

## 2019-07-04 ENCOUNTER — Ambulatory Visit (INDEPENDENT_AMBULATORY_CARE_PROVIDER_SITE_OTHER): Payer: Medicare Other | Admitting: Allergy & Immunology

## 2019-07-04 ENCOUNTER — Telehealth: Payer: Self-pay | Admitting: Radiology

## 2019-07-04 ENCOUNTER — Encounter: Payer: Self-pay | Admitting: Allergy & Immunology

## 2019-07-04 ENCOUNTER — Other Ambulatory Visit (HOSPITAL_COMMUNITY)
Admission: RE | Admit: 2019-07-04 | Discharge: 2019-07-04 | Disposition: A | Discharge status: Home / Self Care | Payer: Medicare Other | Source: Ambulatory Visit | Attending: General Surgery | Admitting: General Surgery

## 2019-07-04 ENCOUNTER — Other Ambulatory Visit: Payer: Self-pay

## 2019-07-04 VITALS — BP 132/62 | HR 88 | Temp 97.4°F | Resp 18

## 2019-07-04 DIAGNOSIS — Z01812 Encounter for preprocedural laboratory examination: Secondary | ICD-10-CM | POA: Diagnosis not present

## 2019-07-04 DIAGNOSIS — Z20822 Contact with and (suspected) exposure to covid-19: Secondary | ICD-10-CM | POA: Insufficient documentation

## 2019-07-04 DIAGNOSIS — T7800XD Anaphylactic reaction due to unspecified food, subsequent encounter: Secondary | ICD-10-CM

## 2019-07-04 NOTE — Patient Instructions (Addendum)
1. 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 - Call us over the course of the day with any concerning symptoms. - Cashew cross-react with pistachio, so you should be able to pistachio as well.  2. Return for multiple challenges: almond, peanut, walnut (cross reacts with pecan).    3. Return as scheduled. This can be an in-person, a virtual Webex or a telephone follow up visit.   Please inform us of any Emergency Department visits, hospitalizations, or changes in symptoms. Call us before going to the ED for breathing or allergy symptoms since we might be able to fit you in for a sick visit. Feel free to contact us anytime with any questions, problems, or concerns.  It was a pleasure to see you again today! We are praying for your   Websites that have reliable patient information: 1. American Academy of Asthma, Allergy, and Immunology: www.aaaai.org 2. Food Allergy Research and Education (FARE): foodallergy.org 3. Mothers of Asthmatics: http://www.asthmacommunitynetwork.org 4. American College of Allergy, Asthma, and Immunology: www.acaai.org   COVID-19 Vaccine Information can be found at: ShippingScam.co.uk For questions related to vaccine distribution or appointments, please email vaccine@[email or web address] .[email or web address] or call [phone].     "Like" [place] on Facebook and Instagram for our latest updates!        Make sure you are registered to vote! If you have moved or changed any of your contact information, you will need to get this updated before voting!  In some cases, you MAY be able to register to vote online: [email or web address]

## 2019-07-04 NOTE — Progress Notes (Signed)
FOLLOW UP  Date of Service/Encounter:  07/04/19   Assessment:   Food allergy(cashew)- passed cashew challenge today  Recent diagnosis of ductal carcinoma in situ - scheduled for surgery on February 23rd   Plan/Recommendations:   1. 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 - Call us over the course of the day with any concerning symptoms. - Cashew cross-react with pistachio, so you should be able to pistachio as well.  2. Return for multiple challenges: almond, peanut, walnut (cross reacts with pecan).    3. Return as scheduled. This can be an in-person, a virtual Webex or a telephone follow up visit.  Subjective:   Kara [name] is a 74 y.o. female presenting today for follow up of  Chief Complaint  Patient presents with  . Food/Drug Challenge    Cashew    Kara [name] has a history of the following: Patient Active Problem List   Diagnosis Date Noted  . Family history of breast cancer   . Family history of prostate cancer   . Malignant neoplasm of upper-outer quadrant of left breast in female, estrogen receptor negative ([place]) 06/11/2019  . 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  . Weight loss, unintentional 03/27/2019  . Hip pain, acute, left 03/27/2019  . 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 Urination pain 01/16/2019  . Vaginal discharge 01/16/2019  . Anxiety 01/12/2019  . Sinusitis 01/06/2019  . Headache in back of head 12/31/2018  . Candida infection of mouth 12/31/2018  . Splinter in skin 12/23/2018  . Insomnia due to stress 12/23/2018  . H/O swallowed foreign body 11/20/2018  . Change in bowel function 04/09/2018  . Breast pain, left 02/25/2018  . Prediabetes 08/30/2017  . Dyspepsia 09/29/2016  . Colon polyps 08/23/2016  . Family history of cancer 08/23/2016  . HLD (hyperlipidemia) 08/23/2016  . Obesity, Class II, BMI 35-39.9, with comorbidity 08/23/2016  . GERD (gastroesophageal reflux  disease) 08/14/2016  . Hypothyroid 08/14/2016  . Perimenopausal vasomotor symptoms 02/11/2016  . 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  Kara [name] is a 74 y.o. female presenting for a food challenge. She was last seen in January 2021 for extreme challenge, which she tolerated very well.  She decided to go ahead with mother challenges to remove allergies from her drug and food list.  She presents today for a cashew challenge.  Since last visit, she has done well.  She does have surgery next week for removal of her ductal carcinoma.  She also has her radiation visits already scheduled as well.  Otherwise, there have been no changes to her past medical history, surgical history, family history, or social history.    Review of Systems  Constitutional: Negative.  Negative for chills, fever, malaise/fatigue and weight loss.  HENT: Negative.  Negative for congestion, ear discharge and ear pain.   Eyes: Negative for pain, discharge and redness.  Respiratory: Negative for cough, sputum production, shortness of breath and wheezing.   Cardiovascular: Negative.  Negative for chest pain and palpitations.  Gastrointestinal: Negative for abdominal pain, heartburn, nausea and vomiting.  Skin: Negative.  Negative for itching and rash.  Neurological: Negative for dizziness and headaches.  Endo/Heme/Allergies: Negative for environmental allergies. Does not bruise/bleed easily.       Objective:   Blood pressure 132/62, pulse 88, temperature (!) 97.4 F (36.3 C), temperature source Temporal, resp. rate 18, SpO2 100 %. There is no height or weight  on file to calculate BMI.   Physical Exam: deferred since this was a food challenge appointment only  Allergy Studies:    Oral Challenge - 07/04/19 1000    Challenge Food/Drug  Cashew    Food/Drug provided by  Patient    BP  132/62    Pulse  88    Respirations  18    Lungs  100%    Time   1031    Dose  Lip rub    Lungs  clear    Skin  clear    Time  1051    Dose  1 whole cashew    Lungs  clear    Time  1111    Dose  3 whole cashews    Lungs  clear    Time  1140    Dose  6 whole cashews    Lungs  clear    Time  1205    Dose  10 whole cashews       Open graded cashew oral challenge: The patient was able to tolerate the challenge today without adverse signs or symptoms. Vital signs were stable throughout the challenge and observation period. She received multiple doses separated by 20 minutes, each of which was separated by vitals and a brief physical exam. She received the following doses: lip rub, 1 whole cashew, 3 whole cashews, 6 whole cashews, and 10 whole cashews. She was monitored for 60 minutes following the last dose.       Salvatore Marvel, MD  [facility]

## 2019-07-04 NOTE — Telephone Encounter (Signed)
Patient called, LMVM asking to schedule an appointment.  I called back this AM, LM for her to call us back.

## 2019-07-05 LAB — SARS CORONAVIRUS 2 (TAT 6-24 HRS): SARS Coronavirus 2: NEGATIVE

## 2019-07-07 ENCOUNTER — Other Ambulatory Visit: Payer: Self-pay

## 2019-07-07 ENCOUNTER — Ambulatory Visit
Admission: RE | Admit: 2019-07-07 | Discharge: 2019-07-07 | Disposition: A | Discharge status: Home / Self Care | Payer: Medicare Other | Source: Ambulatory Visit | Attending: General Surgery | Admitting: General Surgery

## 2019-07-07 DIAGNOSIS — D0512 Intraductal carcinoma in situ of left breast: Secondary | ICD-10-CM | POA: Diagnosis not present

## 2019-07-07 NOTE — Progress Notes (Signed)

## 2019-07-07 NOTE — Anesthesia Preprocedure Evaluation (Addendum)
Anesthesia Evaluation  Patient identified by MRN, date of birth, ID band Patient awake    Reviewed: Allergy & Precautions, NPO status , Patient's Chart, lab work & pertinent test results  History of Anesthesia Complications Negative for: history of anesthetic complications  Airway Mallampati: II  TM Distance: >3 FB Neck ROM: Full    Dental  (+) Dental Advisory Given, Partial Lower   Pulmonary former smoker,    Pulmonary exam normal breath sounds clear to auscultation       Cardiovascular negative cardio ROS Normal cardiovascular exam Rhythm:Regular Rate:Normal     Neuro/Psych  Headaches, PSYCHIATRIC DISORDERS Anxiety    GI/Hepatic Neg liver ROS, GERD  Medicated and Controlled,  Endo/Other  Hypothyroidism Obesity   Renal/GU negative Renal ROS     Musculoskeletal  (+) Arthritis ,   Abdominal   Peds  Hematology negative hematology ROS (+)   Anesthesia Other Findings Day of surgery medications reviewed with the patient.  LEFT BREAST DCIS  Reproductive/Obstetrics                            Anesthesia Physical Anesthesia Plan  ASA: II  Anesthesia Plan: General   Post-op Pain Management:    Induction: Intravenous  PONV Risk Score and Plan: 3 and Dexamethasone, Ondansetron and Treatment may vary due to age or medical condition  Airway Management Planned: LMA  Additional Equipment:   Intra-op Plan:   Post-operative Plan: Extubation in OR  Informed Consent: I have reviewed the patients History and Physical, chart, labs and discussed the procedure including the risks, benefits and alternatives for the proposed anesthesia with the patient or authorized representative who has indicated his/her understanding and acceptance.     Dental advisory given  Plan Discussed with: CRNA  Anesthesia Plan Comments:        Anesthesia Quick Evaluation

## 2019-07-08 ENCOUNTER — Ambulatory Visit (HOSPITAL_BASED_OUTPATIENT_CLINIC_OR_DEPARTMENT_OTHER): Payer: Medicare Other | Admitting: Anesthesiology

## 2019-07-08 ENCOUNTER — Encounter (HOSPITAL_BASED_OUTPATIENT_CLINIC_OR_DEPARTMENT_OTHER): Payer: Self-pay | Admitting: General Surgery

## 2019-07-08 ENCOUNTER — Encounter (HOSPITAL_BASED_OUTPATIENT_CLINIC_OR_DEPARTMENT_OTHER)
Admission: RE | Disposition: A | Discharge status: Home / Self Care | Payer: Self-pay | Source: Home / Self Care | Attending: General Surgery

## 2019-07-08 ENCOUNTER — Telehealth: Payer: Self-pay | Admitting: Genetic Counselor

## 2019-07-08 ENCOUNTER — Ambulatory Visit (HOSPITAL_BASED_OUTPATIENT_CLINIC_OR_DEPARTMENT_OTHER)
Admission: RE | Admit: 2019-07-08 | Discharge: 2019-07-08 | Disposition: A | Discharge status: Home / Self Care | Payer: Medicare Other | Attending: General Surgery | Admitting: General Surgery

## 2019-07-08 ENCOUNTER — Ambulatory Visit
Admission: RE | Admit: 2019-07-08 | Discharge: 2019-07-08 | Disposition: A | Discharge status: Home / Self Care | Payer: Medicare Other | Source: Ambulatory Visit | Attending: General Surgery | Admitting: General Surgery

## 2019-07-08 ENCOUNTER — Other Ambulatory Visit: Payer: Self-pay

## 2019-07-08 DIAGNOSIS — D0512 Intraductal carcinoma in situ of left breast: Secondary | ICD-10-CM | POA: Diagnosis not present

## 2019-07-08 DIAGNOSIS — E039 Hypothyroidism, unspecified: Secondary | ICD-10-CM | POA: Insufficient documentation

## 2019-07-08 DIAGNOSIS — K219 Gastro-esophageal reflux disease without esophagitis: Secondary | ICD-10-CM | POA: Insufficient documentation

## 2019-07-08 DIAGNOSIS — E669 Obesity, unspecified: Secondary | ICD-10-CM | POA: Insufficient documentation

## 2019-07-08 DIAGNOSIS — N6092 Unspecified benign mammary dysplasia of left breast: Secondary | ICD-10-CM | POA: Diagnosis not present

## 2019-07-08 DIAGNOSIS — Z6831 Body mass index (BMI) 31.0-31.9, adult: Secondary | ICD-10-CM | POA: Diagnosis not present

## 2019-07-08 DIAGNOSIS — Z87891 Personal history of nicotine dependence: Secondary | ICD-10-CM | POA: Diagnosis not present

## 2019-07-08 DIAGNOSIS — N6489 Other specified disorders of breast: Secondary | ICD-10-CM | POA: Diagnosis not present

## 2019-07-08 DIAGNOSIS — Z79890 Hormone replacement therapy (postmenopausal): Secondary | ICD-10-CM | POA: Insufficient documentation

## 2019-07-08 DIAGNOSIS — Z171 Estrogen receptor negative status [ER-]: Secondary | ICD-10-CM | POA: Diagnosis not present

## 2019-07-08 DIAGNOSIS — B6012 Conjunctivitis due to Acanthamoeba: Secondary | ICD-10-CM | POA: Diagnosis not present

## 2019-07-08 DIAGNOSIS — Z803 Family history of malignant neoplasm of breast: Secondary | ICD-10-CM | POA: Diagnosis not present

## 2019-07-08 DIAGNOSIS — E785 Hyperlipidemia, unspecified: Secondary | ICD-10-CM | POA: Diagnosis not present

## 2019-07-08 DIAGNOSIS — N6012 Diffuse cystic mastopathy of left breast: Secondary | ICD-10-CM | POA: Diagnosis not present

## 2019-07-08 HISTORY — DX: Unspecified osteoarthritis, unspecified site: M19.90

## 2019-07-08 HISTORY — DX: Prediabetes: R73.03

## 2019-07-08 HISTORY — PX: BREAST LUMPECTOMY WITH RADIOACTIVE SEED LOCALIZATION: SHX6424

## 2019-07-08 SURGERY — BREAST LUMPECTOMY WITH RADIOACTIVE SEED LOCALIZATION
Anesthesia: General | Site: Breast | Laterality: Left

## 2019-07-08 MED ORDER — KETOROLAC TROMETHAMINE 30 MG/ML IJ SOLN
INTRAMUSCULAR | Status: AC
  Filled 2019-07-08: qty 1

## 2019-07-08 MED ORDER — HYDROCODONE-ACETAMINOPHEN 5-325 MG PO TABS: 1.0000 | ORAL_TABLET | Freq: Four times a day (QID) | ORAL | 0 refills | Status: DC | PRN

## 2019-07-08 MED ORDER — ONDANSETRON HCL 4 MG/2ML IJ SOLN
INTRAMUSCULAR | Status: AC
  Filled 2019-07-08: qty 2

## 2019-07-08 MED ORDER — FENTANYL CITRATE (PF) 100 MCG/2ML IJ SOLN: 50.0000 ug | INTRAMUSCULAR | Status: DC | PRN

## 2019-07-08 MED ORDER — KETOROLAC TROMETHAMINE 30 MG/ML IJ SOLN
30.0000 mg | Freq: Once | INTRAMUSCULAR | Status: AC
  Administered 2019-07-08: 30 mg via INTRAVENOUS

## 2019-07-08 MED ORDER — FENTANYL CITRATE (PF) 100 MCG/2ML IJ SOLN
INTRAMUSCULAR | Status: DC | PRN
  Administered 2019-07-08 (×4): 25 ug via INTRAVENOUS

## 2019-07-08 MED ORDER — ONDANSETRON HCL 4 MG/2ML IJ SOLN: 4.0000 mg | Freq: Once | INTRAMUSCULAR | Status: DC | PRN

## 2019-07-08 MED ORDER — MIDAZOLAM HCL 2 MG/2ML IJ SOLN
1.0000 mg | INTRAMUSCULAR | Status: DC | PRN
  Administered 2019-07-08: 2 mg via INTRAVENOUS

## 2019-07-08 MED ORDER — PHENYLEPHRINE 40 MCG/ML (10ML) SYRINGE FOR IV PUSH (FOR BLOOD PRESSURE SUPPORT)
PREFILLED_SYRINGE | INTRAVENOUS | Status: DC | PRN
  Administered 2019-07-08 (×2): 80 ug via INTRAVENOUS
  Administered 2019-07-08: 120 ug via INTRAVENOUS

## 2019-07-08 MED ORDER — BUPIVACAINE HCL (PF) 0.25 % IJ SOLN
INTRAMUSCULAR | Status: AC
  Filled 2019-07-08: qty 120

## 2019-07-08 MED ORDER — ONDANSETRON HCL 4 MG/2ML IJ SOLN
INTRAMUSCULAR | Status: DC | PRN
  Administered 2019-07-08: 4 mg via INTRAVENOUS

## 2019-07-08 MED ORDER — LIDOCAINE 2% (20 MG/ML) 5 ML SYRINGE
INTRAMUSCULAR | Status: DC | PRN
  Administered 2019-07-08: 80 mg via INTRAVENOUS

## 2019-07-08 MED ORDER — CHLORHEXIDINE GLUCONATE CLOTH 2 % EX PADS: 6.0000 | MEDICATED_PAD | Freq: Once | CUTANEOUS | Status: DC

## 2019-07-08 MED ORDER — PROPOFOL 10 MG/ML IV BOLUS
INTRAVENOUS | Status: DC | PRN
  Administered 2019-07-08: 150 mg via INTRAVENOUS

## 2019-07-08 MED ORDER — LIDOCAINE 2% (20 MG/ML) 5 ML SYRINGE
INTRAMUSCULAR | Status: AC
  Filled 2019-07-08: qty 5

## 2019-07-08 MED ORDER — DEXAMETHASONE SODIUM PHOSPHATE 10 MG/ML IJ SOLN
INTRAMUSCULAR | Status: DC | PRN
  Administered 2019-07-08: 10 mg via INTRAVENOUS

## 2019-07-08 MED ORDER — CEFAZOLIN SODIUM-DEXTROSE 2-4 GM/100ML-% IV SOLN
INTRAVENOUS | Status: AC
  Filled 2019-07-08: qty 100

## 2019-07-08 MED ORDER — FENTANYL CITRATE (PF) 100 MCG/2ML IJ SOLN: 25.0000 ug | INTRAMUSCULAR | Status: DC | PRN

## 2019-07-08 MED ORDER — BUPIVACAINE HCL 0.25 % IJ SOLN
INTRAMUSCULAR | Status: DC | PRN
  Administered 2019-07-08: 20 mL

## 2019-07-08 MED ORDER — MIDAZOLAM HCL 2 MG/2ML IJ SOLN
INTRAMUSCULAR | Status: AC
  Filled 2019-07-08: qty 2

## 2019-07-08 MED ORDER — EPHEDRINE SULFATE-NACL 50-0.9 MG/10ML-% IV SOSY
PREFILLED_SYRINGE | INTRAVENOUS | Status: DC | PRN
  Administered 2019-07-08: 10 mg via INTRAVENOUS
  Administered 2019-07-08: 5 mg via INTRAVENOUS

## 2019-07-08 MED ORDER — CEFAZOLIN SODIUM-DEXTROSE 2-4 GM/100ML-% IV SOLN
2.0000 g | INTRAVENOUS | Status: AC
  Administered 2019-07-08: 2 g via INTRAVENOUS

## 2019-07-08 MED ORDER — DEXAMETHASONE SODIUM PHOSPHATE 10 MG/ML IJ SOLN
INTRAMUSCULAR | Status: AC
  Filled 2019-07-08: qty 1

## 2019-07-08 MED ORDER — PROPOFOL 500 MG/50ML IV EMUL
INTRAVENOUS | Status: AC
  Filled 2019-07-08: qty 50

## 2019-07-08 MED ORDER — ACETAMINOPHEN 500 MG PO TABS
1000.0000 mg | ORAL_TABLET | ORAL | Status: AC
  Administered 2019-07-08: 1000 mg via ORAL

## 2019-07-08 MED ORDER — FENTANYL CITRATE (PF) 100 MCG/2ML IJ SOLN
INTRAMUSCULAR | Status: AC
  Filled 2019-07-08: qty 2

## 2019-07-08 MED ORDER — GABAPENTIN 300 MG PO CAPS
300.0000 mg | ORAL_CAPSULE | ORAL | Status: AC
  Administered 2019-07-08: 300 mg via ORAL

## 2019-07-08 MED ORDER — GABAPENTIN 300 MG PO CAPS
ORAL_CAPSULE | ORAL | Status: AC
  Filled 2019-07-08: qty 1

## 2019-07-08 MED ORDER — LACTATED RINGERS IV SOLN: INTRAVENOUS | Status: DC

## 2019-07-08 MED ORDER — ACETAMINOPHEN 500 MG PO TABS
ORAL_TABLET | ORAL | Status: AC
  Filled 2019-07-08: qty 2

## 2019-07-08 SURGICAL SUPPLY — 42 items
APPLIER CLIP 9.375 MED OPEN (MISCELLANEOUS) ×3
BLADE SURG 15 STRL LF DISP TIS (BLADE) ×1 IMPLANT
BLADE SURG 15 STRL SS (BLADE) ×2
CANISTER SUC SOCK COL 7IN (MISCELLANEOUS) ×3 IMPLANT
CANISTER SUCT 1200ML W/VALVE (MISCELLANEOUS) ×3 IMPLANT
CHLORAPREP W/TINT 26 (MISCELLANEOUS) ×3 IMPLANT
CLIP APPLIE 9.375 MED OPEN (MISCELLANEOUS) IMPLANT
COVER BACK TABLE 60X90IN (DRAPES) ×3 IMPLANT
COVER MAYO STAND STRL (DRAPES) ×3 IMPLANT
COVER PROBE W GEL 5X96 (DRAPES) ×3 IMPLANT
DERMABOND ADVANCED (GAUZE/BANDAGES/DRESSINGS) ×2
DERMABOND ADVANCED .7 DNX12 (GAUZE/BANDAGES/DRESSINGS) ×1 IMPLANT
DRAPE LAPAROSCOPIC ABDOMINAL (DRAPES) ×3 IMPLANT
DRAPE UTILITY XL STRL (DRAPES) ×3 IMPLANT
ELECT COATED BLADE 2.86 ST (ELECTRODE) ×3 IMPLANT
ELECT REM PT RETURN 9FT ADLT (ELECTROSURGICAL) ×3
ELECTRODE REM PT RTRN 9FT ADLT (ELECTROSURGICAL) ×1 IMPLANT
GLOVE BIO SURGEON STRL SZ 6.5 (GLOVE) ×1 IMPLANT
GLOVE BIO SURGEON STRL SZ7 (GLOVE) ×2 IMPLANT
GLOVE BIO SURGEON STRL SZ7.5 (GLOVE) ×6 IMPLANT
GLOVE BIO SURGEONS STRL SZ 6.5 (GLOVE) ×1
GLOVE BIOGEL PI IND STRL 7.0 (GLOVE) IMPLANT
GLOVE BIOGEL PI INDICATOR 7.0 (GLOVE) ×4
GOWN STRL REUS W/ TWL LRG LVL3 (GOWN DISPOSABLE) ×2 IMPLANT
GOWN STRL REUS W/TWL LRG LVL3 (GOWN DISPOSABLE) ×6
KIT MARKER MARGIN INK (KITS) ×3 IMPLANT
NDL HYPO 25X1 1.5 SAFETY (NEEDLE) IMPLANT
NEEDLE HYPO 25X1 1.5 SAFETY (NEEDLE) ×3 IMPLANT
NS IRRIG 1000ML POUR BTL (IV SOLUTION) ×2 IMPLANT
PACK BASIN DAY SURGERY FS (CUSTOM PROCEDURE TRAY) ×3 IMPLANT
PENCIL SMOKE EVACUATOR (MISCELLANEOUS) ×3 IMPLANT
SLEEVE SCD COMPRESS KNEE MED (MISCELLANEOUS) ×3 IMPLANT
SPONGE LAP 18X18 RF (DISPOSABLE) ×3 IMPLANT
SUT MON AB 4-0 PC3 18 (SUTURE) ×3 IMPLANT
SUT SILK 2 0 SH (SUTURE) IMPLANT
SUT VICRYL 3-0 CR8 SH (SUTURE) ×3 IMPLANT
SYR CONTROL 10ML LL (SYRINGE) ×2 IMPLANT
TOWEL GREEN STERILE FF (TOWEL DISPOSABLE) ×3 IMPLANT
TRAY FAXITRON CT DISP (TRAY / TRAY PROCEDURE) ×3 IMPLANT
TUBE CONNECTING 20'X1/4 (TUBING) ×1
TUBE CONNECTING 20X1/4 (TUBING) ×2 IMPLANT
YANKAUER SUCT BULB TIP NO VENT (SUCTIONS) ×2 IMPLANT

## 2019-07-08 NOTE — H&P (Signed)
Kara [name]  Location: [facility] Patient #: [id number] DOB: [birth year] Divorced / Language: English / Race: Black or African American Female   History of Present Illness  The patient is a 74 year old female who presents with breast cancer. We're asked to see the patient in consultation by Dr. Benny Lennert to evaluate her for a new left breast cancer. The patient is a 74 year old black female who recently went for a routine screening mammogram. I reviewed her mammogram and pathology. She was found to have a 1.1 cm area of calcification in the upper outer quadrant of the left breast. 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 She does not smoke. She does have a strong family history of breast cancer in her mother, paternal and maternal grandmothers and aunts. She does take estrogen replacement.   Past Surgical History Gallbladder Surgery - Laparoscopic  Hysterectomy (not due to cancer) - Complete  Tonsillectomy   Diagnostic Studies History  Mammogram  within last year  Allergies Iodine *ANTISEPTICS & DISINFECTANTS*  Naproxen *ANALGESICS - ANTI-INFLAMMATORY*  predniSONE *CORTICOSTEROIDS*  Nuts  Shellfish   Medication History  Synthroid (100MCG Tablet, Oral) Active. Estradiol (0.0375MG /24HR Patch TW, Transdermal) Active. Xyzal (2.5MG /5ML Solution, Oral) Active. Medications Reconciled  Social History  Alcohol use  Remotely quit alcohol use. Caffeine use  Carbonated beverages. No drug use  Tobacco use  Former smoker.  Family History  Arthritis  Mother. Breast Cancer  Mother. Respiratory Condition  Father. Thyroid problems  Father.  Pregnancy / Birth History Age at menarche  74 years. Age of menopause  61-55 Gravida  2 Maternal age  62-25 Para  2  Other Problems Arthritis  Breast Cancer  Gastroesophageal Reflux Disease  Hypercholesterolemia  Oophorectomy      Review of Systems  General Present- Weight Loss. Not Present- Appetite Loss, Chills, Fatigue, Fever, Night Sweats and Weight Gain. Breast Present- Breast Pain. Not Present- Breast Mass, Nipple Discharge and Skin Changes. Cardiovascular Present- Swelling of Extremities. Not Present- Chest Pain, Difficulty Breathing Lying Down, Leg Cramps, Palpitations, Rapid Heart Rate and Shortness of Breath. Gastrointestinal Not Present- Abdominal Pain, Bloating, Bloody Stool, Change in Bowel Habits, Chronic diarrhea, Constipation, Difficulty Swallowing, Excessive gas, Gets full quickly at meals, Hemorrhoids, Indigestion, Nausea, Rectal Pain and Vomiting. Female Genitourinary Not Present- Frequency, Nocturia, Painful Urination, Pelvic Pain and Urgency. Neurological Not Present- Decreased Memory, Fainting, Headaches, Numbness, Seizures, Tingling, Tremor, Trouble walking and Weakness. Psychiatric Present- Anxiety and Fearful. Not Present- Bipolar, Change in Sleep Pattern, Depression and Frequent crying. Endocrine Not Present- Cold Intolerance, Excessive Hunger, Hair Changes, Heat Intolerance, Hot flashes and New Diabetes. Hematology Not Present- Blood Thinners, Easy Bruising, Excessive bleeding, Gland problems, HIV and Persistent Infections.  Vitals Weight: 192 lb Height: 66in Body Surface Area: 1.97 m Body Mass Index: 30.99 kg/m  Temp.: 69F(Oral)  Pulse: 102 (Regular)  BP: 126/84 (Sitting, Left Arm, Standard)       Physical Exam  General Mental Status-Alert. General Appearance-Consistent with stated age. Hydration-Well hydrated. Voice-Normal.  Head and Neck Head-normocephalic, atraumatic with no lesions or palpable masses. Trachea-midline. Thyroid Gland Characteristics - normal size and consistency.  Eye Eyeball - Bilateral-Extraocular movements intact. Sclera/Conjunctiva - Bilateral-No scleral icterus.  Chest and Lung Exam Chest and lung exam reveals  -quiet, even and easy respiratory effort with no use of accessory muscles and on auscultation, normal breath sounds, no adventitious sounds and normal vocal resonance. Inspection Chest Wall - Normal. Back -  normal.  Breast Note: There is a palpable bruise in the upper outer quadrant of the left breast. Other than this there is no other palpable mass in either breast. There is one small mobile palpable lymph node in the right axilla. There is no other palpable lymphadenopathy.   Cardiovascular Cardiovascular examination reveals -normal heart sounds, regular rate and rhythm with no murmurs and normal pedal pulses bilaterally.  Abdomen Inspection Inspection of the abdomen reveals - No Hernias. Skin - Scar - no surgical scars. Palpation/Percussion Palpation and Percussion of the abdomen reveal - Soft, Non Tender, No Rebound tenderness, No Rigidity (guarding) and No hepatosplenomegaly. Auscultation Auscultation of the abdomen reveals - Bowel sounds normal.  Neurologic Neurologic evaluation reveals -alert and oriented x 3 with no impairment of recent or remote memory. Mental Status-Normal.  Musculoskeletal Normal Exam - Left-Upper Extremity Strength Normal and Lower Extremity Strength Normal. Normal Exam - Right-Upper Extremity Strength Normal and Lower Extremity Strength Normal.  Lymphatic Head & Neck  General Head & Neck Lymphatics: Bilateral - Description - Normal. Axillary  General Axillary Region: Bilateral - Description - Normal. Tenderness - Non Tender. Femoral & Inguinal  Generalized Femoral & Inguinal Lymphatics: Bilateral - Description - Normal. Tenderness - Non Tender.    Assessment & Plan DUCTAL CARCINOMA IN SITU (DCIS) OF LEFT BREAST (D05.12) Impression: The patient appears to have a 1.1 cm area of ductal carcinoma in situ in the upper outer quadrant of the left breast. I have reviewed her mammograms and pathology. I have discussed with her in detail the  different options for treatment and at this point she favors breast conservation. I think is a reasonable way of treating her cancer. I have discussed with her in detail the risks and benefits of the operation as well as some of the technical aspects including the use of a radioactive seed for localization and she understands and wishes to proceed. I will also go ahead and make her referrals to medical and radiation oncology to discuss adjuvant therapy. I will refer her also to genetic counseling given her strong family history of breast cancer. Given the small size of the area involved she will also not require a node evaluation. Current Plans Referred to Genetic Counseling, for evaluation and follow up (Medical Genetics). Routine. Referred to Oncology, for evaluation and follow up (Oncology). Routine.

## 2019-07-08 NOTE — Anesthesia Postprocedure Evaluation (Signed)
Anesthesia Post Note  Patient: Kara [name]  Procedure(s) Performed: LEFT BREAST LUMPECTOMY WITH RADIOACTIVE SEED LOCALIZATION (Left Breast)     Patient location during evaluation: PACU Anesthesia Type: General Level of consciousness: awake and alert, awake and oriented Pain management: pain level controlled Vital Signs Assessment: post-procedure vital signs reviewed and stable Respiratory status: spontaneous breathing, nonlabored ventilation and respiratory function stable Cardiovascular status: blood pressure returned to baseline and stable Postop Assessment: no apparent nausea or vomiting Anesthetic complications: no    Last Vitals:  Vitals:   07/08/19 1030 07/08/19 1053  BP: (!) 147/67 (!) 143/67  Pulse: 92 86  Resp: 20 20  Temp: (!) 36.4 C (!) 36.4 C  SpO2: 100% 99%    Last Pain:  Vitals:   07/08/19 1053  TempSrc:   PainSc: 2                  Catalina Gravel

## 2019-07-08 NOTE — Telephone Encounter (Signed)
LM on VM asking whether she wanted to set up a blood draw appointment or if mobile phlebotomy has helped set one up.  Asked that she please CB.

## 2019-07-08 NOTE — Anesthesia Procedure Notes (Signed)
Procedure Name: LMA Insertion Date/Time: 07/08/2019 8:48 AM Performed by: Genelle Bal, CRNA Pre-anesthesia Checklist: Patient identified, Emergency Drugs available, Suction available and Patient being monitored Patient Re-evaluated:Patient Re-evaluated prior to induction Oxygen Delivery Method: Circle system utilized Preoxygenation: Pre-oxygenation with 100% oxygen Induction Type: IV induction Ventilation: Mask ventilation without difficulty LMA: LMA inserted LMA Size: 4.0 Number of attempts: 1 Airway Equipment and Method: Bite block Placement Confirmation: positive ETCO2 Tube secured with: Tape Dental Injury: Teeth and Oropharynx as per pre-operative assessment

## 2019-07-08 NOTE — Transfer of Care (Signed)
Immediate Anesthesia Transfer of Care Note  Patient: [name]  Procedure(s) Performed: LEFT BREAST LUMPECTOMY WITH RADIOACTIVE SEED LOCALIZATION (Left Breast)  Patient Location: PACU  Anesthesia Type:General  Level of Consciousness: awake, alert  and oriented  Airway & Oxygen Therapy: Patient Spontanous Breathing and Patient connected to face mask oxygen  Post-op Assessment: Report given to RN and Post -op Vital signs reviewed and stable  Post vital signs: Reviewed and stable  Last Vitals:  Vitals Value Taken Time  BP 140/85 07/08/19 0945  Temp    Pulse 103 07/08/19 0947  Resp 18 07/08/19 0947  SpO2 100 % 07/08/19 0947  Vitals shown include unvalidated device data.  Last Pain:  Vitals:   07/08/19 0742  TempSrc: Temporal  PainSc: 4       Patients Stated Pain Goal: 4 ([id number])  Complications: No apparent anesthesia complications

## 2019-07-08 NOTE — Discharge Instructions (Signed)
No tylenol before 2pm. No ibuprofen before 6:30pm.   Post Anesthesia Home Care Instructions  Activity: Get plenty of rest for the remainder of the day. A responsible individual must stay with you for 24 hours following the procedure.  For the next 24 hours, DO NOT: -Drive a car -Paediatric nurse -Drink alcoholic beverages -Take any medication unless instructed by your physician -Make any legal decisions or sign important papers.  Meals: Start with liquid foods such as gelatin or soup. Progress to regular foods as tolerated. Avoid greasy, spicy, heavy foods. If nausea and/or vomiting occur, drink only clear liquids until the nausea and/or vomiting subsides. Call your physician if vomiting continues.  Special Instructions/Symptoms: Your throat may feel dry or sore from the anesthesia or the breathing tube placed in your throat during surgery. If this causes discomfort, gargle with warm salt water. The discomfort should disappear within 24 hours.  If you had a scopolamine patch placed behind your ear for the management of post- operative nausea and/or vomiting:  1. The medication in the patch is effective for 72 hours, after which it should be removed.  Wrap patch in a tissue and discard in the trash. Wash hands thoroughly with soap and water. 2. You may remove the patch earlier than 72 hours if you experience unpleasant side effects which may include dry mouth, dizziness or visual disturbances. 3. Avoid touching the patch. Wash your hands with soap and water after contact with the patch.

## 2019-07-08 NOTE — Op Note (Signed)
07/08/2019  9:36 AM  PATIENT:  Kara [name]  74 y.o. female  PRE-OPERATIVE DIAGNOSIS:  LEFT BREAST DCIS  POST-OPERATIVE DIAGNOSIS:  LEFT BREAST DCIS  PROCEDURE:  Procedure(s): LEFT BREAST LUMPECTOMY WITH RADIOACTIVE SEED LOCALIZATION (Left)  SURGEON:  Surgeon(s) and Role:    Jovita Kussmaul, MD - Primary  PHYSICIAN ASSISTANT:   ASSISTANTS: none   ANESTHESIA:   local and general  EBL:  10 mL   BLOOD ADMINISTERED:none  DRAINS: none   LOCAL MEDICATIONS USED:  MARCAINE     SPECIMEN:  Source of Specimen:  left breast tissue with additional margins  DISPOSITION OF SPECIMEN:  PATHOLOGY  COUNTS:  YES  TOURNIQUET:  * No tourniquets in log *  DICTATION: .Dragon Dictation   After informed consent was obtained the patient was brought to the operating room and placed in the supine position on the operating table.  After adequate induction of general anesthesia the patient's left breast was prepped with ChloraPrep, allowed to dry, and draped in usual sterile manner.  An appropriate timeout was performed.  Previously an I-125 seed was placed in the upper outer quadrant of the left breast to mark an area of ductal carcinoma in situ.  The neoprobe was set to I-125 and an area of radioactivity was readily identified.  Because of the superficial nature of the seed I elected to make an elliptical incision in the skin overlying the area of radioactivity after infiltrating the area around it with quarter percent Marcaine.  The incision was carried through the skin and subcutaneous tissue sharply with the electrocautery.  A column of breast tissue was then excised sharply with the electrocautery around the radioactive seed while checking the area of radioactivity frequently.  This dissection was carried all the way to the chest wall.  Once the specimen was removed it was oriented with the appropriate paint colors.  A specimen radiograph was obtained that showed the clip and seed to be near the  center of the specimen.  The specimen was then sent to pathology for further evaluation.  I did elect to remove an additional superior, inferior, medial, lateral, and deep margin and these were all marked appropriately and sent to pathology as well.  Hemostasis was achieved using the Bovie electrocautery.  The cavity was marked with clips.  The wound was irrigated with saline and infiltrated with more quarter percent Marcaine.  The deep layer of the wound was then closed with layers of interrupted 3-0 Vicryl stitches.  The skin was then closed with a running 4-0 Monocryl subcuticular stitch.  Dermabond dressings were applied.  The patient tolerated the procedure well.  At the end of the case all needle sponge and instrument counts were correct.  The patient was then awakened and taken to recovery in stable condition.  PLAN OF CARE: Discharge to home after PACU  PATIENT DISPOSITION:  PACU - hemodynamically stable.   Delay start of Pharmacological VTE agent (>24hrs) due to surgical blood loss or risk of bleeding: not applicable

## 2019-07-08 NOTE — Interval H&P Note (Signed)
History and Physical Interval Note:  07/08/2019 7:53 AM  Kara [name]  has presented today for surgery, with the diagnosis of LEFT BREAST DCIS.  The various methods of treatment have been discussed with the patient and family. After consideration of risks, benefits and other options for treatment, the patient has consented to  Procedure(s): LEFT BREAST LUMPECTOMY WITH RADIOACTIVE SEED LOCALIZATION (Left) as a surgical intervention.  The patient's history has been reviewed, patient examined, no change in status, stable for surgery.  I have reviewed the patient's chart and labs.  Questions were answered to the patient's satisfaction.     Autumn Messing III

## 2019-07-09 ENCOUNTER — Other Ambulatory Visit: Payer: Self-pay | Admitting: Emergency Medicine

## 2019-07-09 ENCOUNTER — Encounter: Payer: Self-pay | Admitting: *Deleted

## 2019-07-09 DIAGNOSIS — F419 Anxiety disorder, unspecified: Secondary | ICD-10-CM

## 2019-07-09 MED ORDER — ESCITALOPRAM OXALATE 5 MG PO TABS: 5.0000 mg | ORAL_TABLET | Freq: Every day | ORAL | 3 refills | Status: DC

## 2019-07-09 NOTE — Progress Notes (Unsigned)
e

## 2019-07-10 ENCOUNTER — Ambulatory Visit: Payer: Medicare Other | Admitting: Orthopaedic Surgery

## 2019-07-10 ENCOUNTER — Ambulatory Visit: Payer: Medicare Other | Admitting: "Endocrinology

## 2019-07-10 LAB — SURGICAL PATHOLOGY

## 2019-07-17 ENCOUNTER — Other Ambulatory Visit: Payer: Self-pay

## 2019-07-17 ENCOUNTER — Encounter: Payer: Self-pay | Admitting: Orthopaedic Surgery

## 2019-07-17 ENCOUNTER — Ambulatory Visit (INDEPENDENT_AMBULATORY_CARE_PROVIDER_SITE_OTHER): Payer: Medicare Other | Admitting: Orthopaedic Surgery

## 2019-07-17 VITALS — BP 125/72 | HR 76 | Temp 97.2°F | Ht 66.0 in | Wt 187.0 lb

## 2019-07-17 DIAGNOSIS — G8929 Other chronic pain: Secondary | ICD-10-CM

## 2019-07-17 DIAGNOSIS — M25561 Pain in right knee: Secondary | ICD-10-CM | POA: Diagnosis not present

## 2019-07-17 NOTE — Progress Notes (Signed)
Patient Kara [name]:[id number] [place], female DOB:July 28, 1945, 74 y.o. QV:[id number]  Chief Complaint  Patient presents with  . Knee Pain    right     HPI  Kara [name] is a 74 y.o. female who has continued pain of the right knee.  She was to have MRI of the knee last summer but was unable to because of other medical problems.  She recently had left breast surgery and will be undergoing radiation treatments.  Her right knee hurts, swells, pops and gives way. She cannot have prednisone secondary to significant allergy.  I would like to get MRI of the right knee.  Use Aspercreme, BioFreeze or Voltaren Gel   Body mass index is 30.18 kg/m.  ROS  Review of Systems  Constitutional: Positive for activity change.  Musculoskeletal: Positive for arthralgias and gait problem.  All other systems reviewed and are negative.   All other systems reviewed and are negative.  The following is a summary of the past history medically, past history surgically, known current medicines, social history and family history.  This information is gathered electronically by the computer from prior information and documentation.  I review this each visit and have found including this information at this point in the chart is beneficial and informative.    Past Medical History:  Diagnosis Date  . Allergy    food allergies, drug allergies  . Anemia   . Anxiety   . Arthritis   . Cataract   . Family history of breast cancer   . Family history of prostate cancer   . GERD (gastroesophageal reflux disease)   . Hyperlipidemia   . Malignant neoplasm of upper-outer quadrant of left breast in female, estrogen receptor negative ([place]) 06/11/2019   DCIS left breast  . Pre-diabetes    no meds  . Thyroid disease     Past Surgical History:  Procedure Laterality Date  . ABDOMINAL HYSTERECTOMY     bleeding. fibroids  . BREAST LUMPECTOMY WITH RADIOACTIVE SEED LOCALIZATION Left 07/08/2019   Procedure: LEFT BREAST LUMPECTOMY  WITH RADIOACTIVE SEED LOCALIZATION;  Surgeon: Jovita Kussmaul, MD;  Location: [facility];  Service: General;  Laterality: Left;  . CESAREAN SECTION    . CHOLECYSTECTOMY    . COLONOSCOPY  2011   [place]: two 3-4 mm sessile polyps, hyperplastic, sigmoid diverticula, TI appeared normal.   . ESOPHAGOGASTRODUODENOSCOPY  2011   [place]: non-bleeding erosive gastropathy, normal duodenum. path: unremarkable duodenum, negative H.pylori, minimal esophagitis   . TONSILLECTOMY  1975    Family History  Problem Relation Age of Onset  . Breast cancer Maternal Grandmother        dx over 65  . Prostate cancer Maternal Grandfather   . Arthritis Mother   . COPD Mother   . Breast cancer Mother 88       second at age 64  . Cancer Father        throat  . Stroke Father   . Cancer Maternal Aunt [address] L[place], cervical cancer  . Breast cancer Maternal Aunt        dx over 57  . Breast cancer Paternal Aunt        dx under 50  . Prostate cancer Maternal Uncle        dx over 45  . Cancer Other        father's maternal cousin was Henreietta Lacks, Cervical  . Cancer Maternal Uncle  unknown cancer  . Breast cancer Paternal Aunt        dx over 87  . Colon cancer Neg Hx   . Allergic rhinitis Neg Hx   . Angioedema Neg Hx   . Asthma Neg Hx   . Atopy Neg Hx   . Eczema Neg Hx   . Urticaria Neg Hx   . Immunodeficiency Neg Hx     Social History Social History   Tobacco Use  . Smoking status: Former Smoker    Quit date: 06/15/1998    Years since quitting: 21.1  . Smokeless tobacco: Never Used  . Tobacco comment: quit 2002  Substance Use Topics  . Alcohol use: No  . Drug use: No    Allergies  Allergen Reactions  . Other Anaphylaxis    Allergic to seafood and nuts  . Prednisone   . Naproxen Other (See Comments)    headache    Current Outpatient Medications  Medication Sig Dispense Refill  . acetaminophen (TYLENOL) 500 MG tablet Take 500 mg by mouth every 6  (six) hours as needed.    . Ascorbic Acid (VITAMIN C WITH ROSE HIPS) 500 MG tablet Take 500 mg by mouth daily.    . cholecalciferol (VITAMIN D3) 25 MCG (1000 UNIT) tablet Take 1,000 Units by mouth daily.    Marland Kitchen EPINEPHrine 0.3 mg/0.3 mL IJ SOAJ injection Inject 0.3 mLs (0.3 mg total) into the skin as needed. 2 each 2  . escitalopram (LEXAPRO) 5 MG tablet Take 1 tablet (5 mg total) by mouth daily. 30 tablet 3  . famotidine (PEPCID) 20 MG tablet Take 20 mg by mouth 2 (two) times daily.    Marland Kitchen HYDROcodone-acetaminophen (NORCO/VICODIN) 5-325 MG tablet Take 1-2 tablets by mouth every 6 (six) hours as needed for moderate pain or severe pain. 10 tablet 0  . levocetirizine (XYZAL) 5 MG tablet Take 2.5 mg by mouth every evening.     [name] VERIO test strip USE TO CHECK BLOOD SUGAR TWICE DAILY    . SYNTHROID 112 MCG tablet Take 1 tablet (112 mcg total) by mouth daily. 90 tablet 1   No current facility-administered medications for this visit.     Physical Exam  Blood pressure 125/72, pulse 76, temperature (!) 97.2 F (36.2 C), height 5\' 6"  (1.676 m), weight 187 lb (84.8 kg).  Constitutional: overall normal hygiene, normal nutrition, well developed, normal grooming, normal body habitus. Assistive device:none  Musculoskeletal: gait and station Limp right, muscle tone and strength are normal, no tremors or atrophy is present.  .  Neurological: coordination overall normal.  Deep tendon reflex/nerve stretch intact.  Sensation normal.  Cranial nerves II-XII intact.   Skin:   Normal overall no scars, lesions, ulcers or rashes. No psoriasis.  Psychiatric: Alert and oriented x 3.  Recent memory intact, remote memory unclear.  Normal mood and affect. Well groomed.  Good eye contact.  Cardiovascular: overall she has swelling, no varicosities, she has edema bilaterally, normal temperatures of the legs and arms, no clubbing, cyanosis and good capillary refill.  Lymphatic: palpation is normal.  Right knee  is tender, ROM 0 to 105, effusion, crepitus, medial pain, medial positive [place], [place] intact.  Distal edema.  Limp right.  All other systems reviewed and are negative   The patient has been educated about the nature of the problem(s) and counseled on treatment options.  The patient appeared to understand what I have discussed and is in agreement with it.  Encounter Diagnosis  Name  Primary?  . Chronic pain of right knee Yes    PLAN Call if any problems.  Precautions discussed.  Continue current medications.   Return to clinic 2 weeks   Get MRI of the right knee.  Electronically Signed Sanjuana Kava, MD 3/4/202110:43 AM

## 2019-07-21 NOTE — Progress Notes (Signed)
[facility] CONSULT NOTE  Patient Care Team: Corum, Rex Kras, MD as PCP - General (Family Medicine) Gala Romney, Cristopher Estimable, MD as Consulting Physician (Gastroenterology)  CHIEF COMPLAINTS/PURPOSE OF CONSULTATION:  Newly diagnosed breast cancer  HISTORY OF PRESENTING ILLNESS:  Kara [name] 74 y.o. female is here because of recent diagnosis of ductal carcinoma in situ of the left breast. Screening mammogram on 05/21/19 showed left breast calcifications. Diagnostic mammogram on 05/27/19 showed left breast calcifications spanning 1.1cm. Biopsy on 06/04/19 showed DCIS with apocrine features, calcifications, and necrosis, high grade, ER/PR negative. She underwent a left lumpectomy on 07/08/19 with Dr. Marlou Starks for which pathology showed high grade DCIS, 1.5cm, clear margins. She presents to the clinic today for initial evaluation and discussion of treatment options.   I reviewed her records extensively and collaborated the history with the patient.  SUMMARY OF ONCOLOGIC HISTORY: Oncology History  Ductal carcinoma in situ (DCIS) of left breast  06/04/2019 Cancer Staging   Staging form: Breast, AJCC 8th Edition - Clinical stage from 06/04/2019: Stage 0 (cTis (DCIS), cN0, cM0, ER-, PR-) - Signed by Gardenia Phlegm, NP on 06/11/2019   06/11/2019 Initial Diagnosis   Screening mammogram showed left breast calcifications. Diagnostic mammogram showed left breast calcifications spanning 1.1cm. Biopsy showed DCIS with apocrine features, calcifications, and necrosis, high grade, ER/PR negative.   07/08/2019 Surgery   Left lumpectomy Marlou Starks): high grade DCIS, 1.5cm, clear margins.      MEDICAL HISTORY:  Past Medical History:  Diagnosis Date  . Allergy    food allergies, drug allergies  . Anemia   . Anxiety   . Arthritis   . Cataract   . Family history of breast cancer   . Family history of prostate cancer   . GERD (gastroesophageal reflux disease)   . Hyperlipidemia   . Malignant  neoplasm of upper-outer quadrant of left breast in female, estrogen receptor negative ([place]) 06/11/2019   DCIS left breast  . Pre-diabetes    no meds  . Thyroid disease     SURGICAL HISTORY: Past Surgical History:  Procedure Laterality Date  . ABDOMINAL HYSTERECTOMY     bleeding. fibroids  . BREAST LUMPECTOMY WITH RADIOACTIVE SEED LOCALIZATION Left 07/08/2019   Procedure: LEFT BREAST LUMPECTOMY WITH RADIOACTIVE SEED LOCALIZATION;  Surgeon: Jovita Kussmaul, MD;  Location: [facility];  Service: General;  Laterality: Left;  . CESAREAN SECTION    . CHOLECYSTECTOMY    . COLONOSCOPY  2011   [place]: two 3-4 mm sessile polyps, hyperplastic, sigmoid diverticula, TI appeared normal.   . ESOPHAGOGASTRODUODENOSCOPY  2011   [place]: non-bleeding erosive gastropathy, normal duodenum. path: unremarkable duodenum, negative H.pylori, minimal esophagitis   . TONSILLECTOMY  1975    SOCIAL HISTORY: Social History   Socioeconomic History  . Marital status: Divorced    Spouse name: Not on file  . Number of children: 2  . Years of education: 34  . Highest education level: Not on file  Occupational History  . 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 Smoking status: Former Smoker    Quit date: 06/15/1998    Years since quitting: 21.1  . Smokeless tobacco: Never Used  . Tobacco comment: quit 2002  Substance and Sexual Activity  . Alcohol use: No  . Drug use: No  . Sexual activity: Not Currently    Birth control/protection: Surgical    Comment: hyst  Other Topics Concern  . 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 Difficulty of Paying Living Expenses: Not on file  Food  Insecurity:   . Worried About Charity fundraiser in the Last Year: Not on file  . Ran Out of Food in the Last Year: Not on file  Transportation Needs:   . Lack of Transportation (Medical): Not on file  . Lack of Transportation (Non-Medical): Not on file  Physical Activity:   . Days of Exercise per Week: Not on file  . Minutes of Exercise per Session: Not on file  Stress:   . Feeling of Stress : Not on file  Social Connections:   . Frequency of Communication with Friends and Family: Not on file  . Frequency of Social Gatherings with Friends and Family: Not on file  . Attends Religious Services: Not on file  . Active Member of Clubs or Organizations: Not on file  . Attends Archivist Meetings: Not on file  . Marital Status: Not on file  Intimate Partner Violence:   . Fear of Current or Ex-Partner: Not on file  . Emotionally Abused: Not on file  . Physically Abused: Not on file  . Sexually Abused: Not on file    FAMILY HISTORY: Family History  Problem Relation Age of Onset  . Breast cancer Maternal Grandmother        dx over 52  . Prostate cancer Maternal Grandfather   . Arthritis Mother   . COPD Mother   . Breast cancer Mother 70       second at age 6  . Cancer Father        throat  . Stroke Father   . Cancer Maternal Aunt [address] Lacks, cervical cancer  . Breast cancer Maternal Aunt        dx over 98  . Breast cancer Paternal Aunt        dx under 48  . Prostate cancer Maternal Uncle        dx over 25  . Cancer Other        father's maternal cousin was Henreietta Lacks, Cervical  . Cancer Maternal Uncle        unknown cancer  . Breast cancer Paternal Aunt        dx over 69  . Colon cancer Neg Hx   . Allergic rhinitis Neg Hx   . Angioedema Neg Hx   . Asthma Neg Hx   . Atopy Neg Hx   . Eczema Neg Hx   . Urticaria Neg Hx   . Immunodeficiency Neg Hx     ALLERGIES:  is allergic to other; prednisone; and naproxen.  MEDICATIONS:  Current  Outpatient Medications  Medication Sig Dispense Refill  . acetaminophen (TYLENOL) 500 MG tablet Take 500 mg by mouth every 6 (six) hours as needed.    . Ascorbic Acid (VITAMIN C WITH ROSE HIPS) 500 MG tablet Take 500 mg by mouth daily.    . cholecalciferol (VITAMIN D3) 25 MCG (1000 UNIT) tablet Take 1,000 Units by mouth daily.    Marland Kitchen EPINEPHrine 0.3 mg/0.3 mL IJ SOAJ injection Inject 0.3 mLs (0.3 mg total)  into the skin as needed. 2 each 2  . escitalopram (LEXAPRO) 5 MG tablet Take 1 tablet (5 mg total) by mouth daily. 30 tablet 3  . famotidine (PEPCID) 20 MG tablet Take 20 mg by mouth 2 (two) times daily.    Marland Kitchen HYDROcodone-acetaminophen (NORCO/VICODIN) 5-325 MG tablet Take 1-2 tablets by mouth every 6 (six) hours as needed for moderate pain or severe pain. 10 tablet 0  . levocetirizine (XYZAL) 5 MG tablet Take 2.5 mg by mouth every evening.     [name] VERIO test strip USE TO CHECK BLOOD SUGAR TWICE DAILY    . SYNTHROID 112 MCG tablet Take 1 tablet (112 mcg total) by mouth daily. 90 tablet 1   No current facility-administered medications for this visit.    REVIEW OF SYSTEMS:   Constitutional: Denies fevers, chills or abnormal night sweats Eyes: Denies blurriness of vision, double vision or watery eyes Ears, nose, mouth, throat, and face: Denies mucositis or sore throat Respiratory: Denies cough, dyspnea or wheezes Cardiovascular: Denies palpitation, chest discomfort or lower extremity swelling Gastrointestinal:  Denies nausea, heartburn or change in bowel habits Skin: Denies abnormal skin rashes Lymphatics: Denies new lymphadenopathy or easy bruising Neurological:Denies numbness, tingling or new weaknesses Behavioral/Psych: Mood is stable, no new changes  Breast: Denies any palpable lumps or discharge All other systems were reviewed with the patient and are negative.  PHYSICAL EXAMINATION: ECOG PERFORMANCE STATUS: 1 - Symptomatic but completely ambulatory  Vitals:   07/22/19 1305    BP: (!) 120/58  Pulse: 85  Resp: 17  Temp: (!) 97.4 F (36.3 C)  SpO2: 100%   Filed Weights   07/22/19 1305  Weight: 197 lb 4.8 oz (89.5 kg)    GENERAL:alert, no distress and comfortable SKIN: skin color, texture, turgor are normal, no rashes or significant lesions EYES: normal, conjunctiva are pink and non-injected, sclera clear OROPHARYNX:no exudate, no erythema and lips, buccal mucosa, and tongue normal  NECK: supple, thyroid normal size, non-tender, without nodularity LYMPH:  no palpable lymphadenopathy in the cervical, axillary or inguinal LUNGS: clear to auscultation and percussion with normal breathing effort HEART: regular rate & rhythm and no murmurs and no lower extremity edema ABDOMEN:abdomen soft, non-tender and normal bowel sounds Musculoskeletal:no cyanosis of digits and no clubbing  PSYCH: alert & oriented x 3 with fluent speech NEURO: no focal motor/sensory deficits    LABORATORY DATA:  I have reviewed the data as listed Lab Results  Component Value Date   WBC 6.5 04/07/2019   HGB 13.4 04/07/2019   HCT 41.8 04/07/2019   MCV 81.6 04/07/2019   PLT 204 04/07/2019   Lab Results  Component Value Date   NA 140 04/07/2019   K 4.2 04/07/2019   CL 106 04/07/2019   CO2 26 04/07/2019    RADIOGRAPHIC STUDIES: I have personally reviewed the radiological reports and agreed with the findings in the report.  ASSESSMENT AND PLAN:  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:Screening mammogram showed left breast calcifications. Diagnostic mammogram showed left breast calcifications spanning 1.1cm. Biopsy showed DCIS with apocrine features, calcifications, and necrosis, high grade, ER/PR negative.  07/08/2019:Left lumpectomy Marlou Starks): high grade DCIS, 1.5cm, clear margins.   Recommendation: adjuvant radiation therapy followed by surveillance.  I informed her that DCIS is different than invasive breast cancer.  She does not have to worry about  metastatic disease with DCIS. Her risk of recurrence after radiation would be about 7%. There is no role of antiestrogen therapy since she  is ER/PR negative.  We will see her back in 4 months for survivorship care plan visit. After that I can see her once a year.   All questions were answered. The patient knows to call the clinic with any problems, questions or concerns.   Rulon Eisenmenger, MD, MPH 07/22/2019    I, Molly Dorshimer, am acting as scribe for Nicholas Lose, MD.  I have reviewed the above documentation for accuracy and completeness, and I agree with the above.

## 2019-07-22 ENCOUNTER — Inpatient Hospital Stay: Payer: Medicare Other | Attending: Hematology and Oncology | Admitting: Hematology and Oncology

## 2019-07-22 ENCOUNTER — Inpatient Hospital Stay: Payer: Medicare Other

## 2019-07-22 ENCOUNTER — Encounter: Payer: Self-pay | Admitting: *Deleted

## 2019-07-22 ENCOUNTER — Other Ambulatory Visit: Payer: Self-pay | Admitting: Genetic Counselor

## 2019-07-22 ENCOUNTER — Encounter: Payer: Self-pay | Admitting: Hematology and Oncology

## 2019-07-22 ENCOUNTER — Other Ambulatory Visit: Payer: Self-pay

## 2019-07-22 DIAGNOSIS — K219 Gastro-esophageal reflux disease without esophagitis: Secondary | ICD-10-CM

## 2019-07-22 DIAGNOSIS — Z888 Allergy status to other drugs, medicaments and biological substances status: Secondary | ICD-10-CM | POA: Diagnosis not present

## 2019-07-22 DIAGNOSIS — E039 Hypothyroidism, unspecified: Secondary | ICD-10-CM | POA: Insufficient documentation

## 2019-07-22 DIAGNOSIS — Z886 Allergy status to analgesic agent status: Secondary | ICD-10-CM | POA: Insufficient documentation

## 2019-07-22 DIAGNOSIS — Z808 Family history of malignant neoplasm of other organs or systems: Secondary | ICD-10-CM | POA: Diagnosis not present

## 2019-07-22 DIAGNOSIS — M1990 Unspecified osteoarthritis, unspecified site: Secondary | ICD-10-CM | POA: Diagnosis not present

## 2019-07-22 DIAGNOSIS — Z8042 Family history of malignant neoplasm of prostate: Secondary | ICD-10-CM | POA: Diagnosis not present

## 2019-07-22 DIAGNOSIS — Z823 Family history of stroke: Secondary | ICD-10-CM | POA: Diagnosis not present

## 2019-07-22 DIAGNOSIS — Z79899 Other long term (current) drug therapy: Secondary | ICD-10-CM | POA: Diagnosis not present

## 2019-07-22 DIAGNOSIS — Z87891 Personal history of nicotine dependence: Secondary | ICD-10-CM | POA: Diagnosis not present

## 2019-07-22 DIAGNOSIS — Z86010 Personal history of colonic polyps: Secondary | ICD-10-CM | POA: Insufficient documentation

## 2019-07-22 DIAGNOSIS — D0512 Intraductal carcinoma in situ of left breast: Secondary | ICD-10-CM | POA: Insufficient documentation

## 2019-07-22 DIAGNOSIS — Z803 Family history of malignant neoplasm of breast: Secondary | ICD-10-CM

## 2019-07-22 DIAGNOSIS — F419 Anxiety disorder, unspecified: Secondary | ICD-10-CM | POA: Diagnosis not present

## 2019-07-22 DIAGNOSIS — Z171 Estrogen receptor negative status [ER-]: Secondary | ICD-10-CM | POA: Insufficient documentation

## 2019-07-22 DIAGNOSIS — Z8261 Family history of arthritis: Secondary | ICD-10-CM | POA: Insufficient documentation

## 2019-07-22 DIAGNOSIS — Z8719 Personal history of other diseases of the digestive system: Secondary | ICD-10-CM | POA: Diagnosis not present

## 2019-07-22 LAB — GENETIC SCREENING ORDER

## 2019-07-22 NOTE — Assessment & Plan Note (Addendum)
06/11/2019:Screening mammogram showed left breast calcifications. Diagnostic mammogram showed left breast calcifications spanning 1.1cm. Biopsy showed DCIS with apocrine features, calcifications, and necrosis, high grade, ER/PR negative.  07/08/2019:Left lumpectomy Marlou Starks): high grade DCIS, 1.5cm, clear margins.   Recommendation: adjuvant radiation therapy followed by surveillance.

## 2019-07-25 ENCOUNTER — Telehealth: Payer: Self-pay | Admitting: Hematology and Oncology

## 2019-07-25 NOTE — Telephone Encounter (Signed)
Scheduled per 03/09 los, patient has been called and voicemail was left. ?

## 2019-07-28 ENCOUNTER — Ambulatory Visit
Admission: RE | Admit: 2019-07-28 | Discharge: 2019-07-28 | Disposition: A | Discharge status: Home / Self Care | Payer: Medicare Other | Source: Ambulatory Visit | Attending: Radiation Oncology | Admitting: Radiation Oncology

## 2019-07-28 ENCOUNTER — Other Ambulatory Visit: Payer: Self-pay

## 2019-07-28 ENCOUNTER — Encounter: Payer: Self-pay | Admitting: Radiation Oncology

## 2019-07-28 VITALS — BP 110/55 | HR 84 | Temp 98.3°F | Resp 20 | Wt 194.4 lb

## 2019-07-28 DIAGNOSIS — Z8042 Family history of malignant neoplasm of prostate: Secondary | ICD-10-CM | POA: Insufficient documentation

## 2019-07-28 DIAGNOSIS — Z171 Estrogen receptor negative status [ER-]: Secondary | ICD-10-CM | POA: Insufficient documentation

## 2019-07-28 DIAGNOSIS — Z90710 Acquired absence of both cervix and uterus: Secondary | ICD-10-CM | POA: Diagnosis not present

## 2019-07-28 DIAGNOSIS — Z9889 Other specified postprocedural states: Secondary | ICD-10-CM | POA: Diagnosis not present

## 2019-07-28 DIAGNOSIS — Z87891 Personal history of nicotine dependence: Secondary | ICD-10-CM | POA: Diagnosis not present

## 2019-07-28 DIAGNOSIS — E079 Disorder of thyroid, unspecified: Secondary | ICD-10-CM | POA: Insufficient documentation

## 2019-07-28 DIAGNOSIS — D0512 Intraductal carcinoma in situ of left breast: Secondary | ICD-10-CM

## 2019-07-28 DIAGNOSIS — Z510 Encounter for antineoplastic radiation therapy: Secondary | ICD-10-CM | POA: Diagnosis not present

## 2019-07-28 DIAGNOSIS — D649 Anemia, unspecified: Secondary | ICD-10-CM | POA: Diagnosis not present

## 2019-07-28 DIAGNOSIS — E785 Hyperlipidemia, unspecified: Secondary | ICD-10-CM | POA: Insufficient documentation

## 2019-07-28 DIAGNOSIS — F419 Anxiety disorder, unspecified: Secondary | ICD-10-CM | POA: Insufficient documentation

## 2019-07-28 DIAGNOSIS — Z803 Family history of malignant neoplasm of breast: Secondary | ICD-10-CM | POA: Insufficient documentation

## 2019-07-28 NOTE — Progress Notes (Signed)
Radiation Oncology         [phone] ________________________________  Initial Outpatient Consultation  Name: Kara [name] MRN: [id number]  Date: 07/28/2019  DOB: 1945/12/13  CM:KLKJZ, Rex Kras, MD  Corum, Rex Kras, MD   REFERRING PHYSICIAN: Maryruth Hancock, MD  DIAGNOSIS: The encounter diagnosis was Ductal carcinoma in situ (DCIS) of left breast.  Stage 0 (cN0, M0) Left Breast UOQ, DCIS, ER- / PR-, Grade 3  HISTORY OF PRESENT ILLNESS::Kara [name] is a 74 y.o. female who is accompanied by no one. She had routine screening mammography on 05/21/2019 that suggested further evaluation of calcifications seen in the left breast. She underwent unilateral diagnostic mammography and left breast ultrasonography on 05/27/2019 that showed indeterminate left breast calcifications.  Biopsy on 06/04/2019 showed high grade ductal carcinoma in situ with apocrine features, calcifications, and necrosis. Prognostic indicators significant for estrogen receptor, 0% negative and progesterone receptor, 0% negative.  She opted to proceed with left breast lumpectomy on 07/08/2019 performed by Dr. Marlou Starks. Pathology from the procedure revealed high-grade ductal carcinoma in situ with uninvolved margins. DCIS was focally 0.5 cm from the inferior margin. There was also some atypical lobular hyperplasia involving radial sclerosing lesion.  The patient was seen by Dr. Lindi Adie on 07/22/2019. He recommended adjuvant radiation therapy followed by surveillance.  PREVIOUS RADIATION THERAPY: No  PAST MEDICAL HISTORY:  Past Medical History:  Diagnosis Date  . Allergy    food allergies, drug allergies  . Anemia   . Anxiety   . Arthritis   . Cataract   . Family history of breast cancer   . Family history of prostate cancer   . GERD (gastroesophageal reflux disease)   . Hyperlipidemia   . Malignant neoplasm of upper-outer quadrant of left breast in female, estrogen receptor negative ([place]) 06/11/2019   DCIS left breast    . Pre-diabetes    no meds  . Thyroid disease     PAST SURGICAL HISTORY: Past Surgical History:  Procedure Laterality Date  . ABDOMINAL HYSTERECTOMY     bleeding. fibroids  . BREAST LUMPECTOMY WITH RADIOACTIVE SEED LOCALIZATION Left 07/08/2019   Procedure: LEFT BREAST LUMPECTOMY WITH RADIOACTIVE SEED LOCALIZATION;  Surgeon: Jovita Kussmaul, MD;  Location: [facility];  Service: General;  Laterality: Left;  . CESAREAN SECTION    . CHOLECYSTECTOMY    . COLONOSCOPY  2011   [place]: two 3-4 mm sessile polyps, hyperplastic, sigmoid diverticula, TI appeared normal.   . ESOPHAGOGASTRODUODENOSCOPY  2011   [place]: non-bleeding erosive gastropathy, normal duodenum. path: unremarkable duodenum, negative H.pylori, minimal esophagitis   . TONSILLECTOMY  1975    FAMILY HISTORY:  Family History  Problem Relation Age of Onset  . Breast cancer Maternal Grandmother        dx over 62  . Prostate cancer Maternal Grandfather   . Arthritis Mother   . COPD Mother   . Breast cancer Mother 77       second at age 56  . Cancer Father        throat  . Stroke Father   . Cancer Maternal Aunt [address] Lacks, cervical cancer  . Breast cancer Maternal Aunt        dx over 57  . Breast cancer Paternal Aunt        dx under 35  . Prostate cancer Maternal Uncle        dx over 44  . Cancer Other  father's maternal cousin was Henreietta Lacks, Cervical  . Cancer Maternal Uncle        unknown cancer  . Breast cancer Paternal Aunt        dx over 67  . Colon cancer Neg Hx   . Allergic rhinitis Neg Hx   . Angioedema Neg Hx   . Asthma Neg Hx   . Atopy Neg Hx   . Eczema Neg Hx   . Urticaria Neg Hx   . Immunodeficiency Neg Hx     SOCIAL HISTORY:  Social History   Tobacco Use  . Smoking status: Former Smoker    Quit date: 06/15/1998    Years since quitting: 21.1  . Smokeless tobacco: Never Used  . Tobacco comment: quit 2002  Substance Use Topics  . Alcohol use: No   . Drug use: No    ALLERGIES:  Allergies  Allergen Reactions  . Other Anaphylaxis    Allergic to seafood and nuts  . Prednisone Hypertension    Severe headache  . Naproxen Other (See Comments)    headache    MEDICATIONS:  Current Outpatient Medications  Medication Sig Dispense Refill  . acetaminophen (TYLENOL) 500 MG tablet Take 500 mg by mouth every 6 (six) hours as needed.    . Ascorbic Acid (VITAMIN C WITH ROSE HIPS) 500 MG tablet Take 500 mg by mouth daily.    . cholecalciferol (VITAMIN D3) 25 MCG (1000 UNIT) tablet Take 1,000 Units by mouth daily.    Marland Kitchen EPINEPHrine 0.3 mg/0.3 mL IJ SOAJ injection Inject 0.3 mLs (0.3 mg total) into the skin as needed. 2 each 2  . famotidine (PEPCID) 20 MG tablet Take 20 mg by mouth 2 (two) times daily.    Marland Kitchen levocetirizine (XYZAL) 5 MG tablet Take 2.5 mg by mouth every evening.     [name] VERIO test strip USE TO CHECK BLOOD SUGAR TWICE DAILY    . SYNTHROID 112 MCG tablet Take 1 tablet (112 mcg total) by mouth daily. 90 tablet 1   No current facility-administered medications for this encounter.    REVIEW OF SYSTEMS:  A 10+ POINT REVIEW OF SYSTEMS WAS OBTAINED including neurology, dermatology, psychiatry, cardiac, respiratory, lymph, extremities, GI, GU, musculoskeletal, constitutional, reproductive, HEENT.  No breast pain nipple discharge or bleeding prior to diagnosis   PHYSICAL EXAM:  weight is 194 lb 6.4 oz (88.2 kg). Her temperature is 98.3 F (36.8 C). Her blood pressure is 110/55 (abnormal) and her pulse is 84. Her respiration is 20 and oxygen saturation is 100%.   General: Alert and oriented, in no acute distress HEENT: Head is normocephalic. Extraocular movements are intact. Oropharynx is clear. Neck: Neck is supple, no palpable cervical or supraclavicular lymphadenopathy. Heart: Regular in rate and rhythm with no murmurs, rubs, or gallops. Chest: Clear to auscultation bilaterally, with no rhonchi, wheezes, or rales. Abdomen: Soft,  nontender, nondistended, with no rigidity or guarding. Extremities: No cyanosis or edema. Lymphatics: see Neck Exam Skin: No concerning lesions. Musculoskeletal: symmetric strength and muscle tone throughout. Neurologic: Cranial nerves II through XII are grossly intact. No obvious focalities. Speech is fluent. Coordination is intact. Psychiatric: Judgment and insight are intact. Affect is appropriate. Right breast: No palpable mass, nipple discharge, or bleeding. Left breast: Well-healing lumpectomy scar in the upper aspect of the breast.  Some bruising noted laterally in the breast.  mildly swelling noted in the breast no signs of infection  ECOG = 1  0 - Asymptomatic (Fully active, able to carry  on all predisease activities without restriction)  1 - Symptomatic but completely ambulatory (Restricted in physically strenuous activity but ambulatory and able to carry out work of a light or sedentary nature. For example, light housework, office work)  2 - Symptomatic, <50% in bed during the day (Ambulatory and capable of all self care but unable to carry out any work activities. Up and about more than 50% of waking hours)  3 - Symptomatic, >50% in bed, but not bedbound (Capable of only limited self-care, confined to bed or chair 50% or more of waking hours)  4 - Bedbound (Completely disabled. Cannot carry on any self-care. Totally confined to bed or chair)  5 - Death   Eustace Pen MM, Creech RH, Tormey DC, et al. [phone]). "Toxicity and response criteria of the [facility]". [place] Oncol. 5 (6): 649-55  LABORATORY DATA:  Lab Results  Component Value Date   WBC 6.5 04/07/2019   HGB 13.4 04/07/2019   HCT 41.8 04/07/2019   MCV 81.6 04/07/2019   PLT 204 04/07/2019   NEUTROABS 3,419 04/07/2019   Lab Results  Component Value Date   NA 140 04/07/2019   K 4.2 04/07/2019   CL 106 04/07/2019   CO2 26 04/07/2019   GLUCOSE 103 (H) 04/07/2019   CREATININE 0.74  04/07/2019   CALCIUM 9.2 04/07/2019      RADIOGRAPHY: MM Breast Surgical Specimen  Result Date: 07/08/2019 CLINICAL DATA:  Post left breast lumpectomy. EXAM: SPECIMEN RADIOGRAPH OF THE LEFT BREAST COMPARISON:  Previous exam(s). FINDINGS: Status post excision of the left breast. The radioactive seed and coil shaped biopsy marker clip are present, completely intact, and were marked for pathology. IMPRESSION: Specimen radiograph of the left breast. Electronically Signed   By: Everlean Alstrom M.D.   On: 07/08/2019 09:21   MM LT RADIOACTIVE SEED LOC MAMMO GUIDE  Result Date: 07/07/2019 CLINICAL DATA:  Patient presents for radioactive seed localization of a recently biopsied focus of left breast DCIS. EXAM: MAMMOGRAPHIC GUIDED RADIOACTIVE SEED LOCALIZATION OF THE LEFT BREAST COMPARISON:  Previous exam(s). FINDINGS: Patient presents for radioactive seed localization prior to surgical excision. I met with the patient and we discussed the procedure of seed localization including benefits and alternatives. We discussed the high likelihood of a successful procedure. We discussed the risks of the procedure including infection, bleeding, tissue injury and further surgery. We discussed the low dose of radioactivity involved in the procedure. Informed, written consent was given. The usual time-out protocol was performed immediately prior to the procedure. Using mammographic guidance, sterile technique, 1% lidocaine and an I-125 radioactive seed, the coil shaped biopsy clip was localized using a superior approach. The follow-up mammogram images confirm the seed in the expected location and were marked for Dr. Marlou Starks. Follow-up survey of the patient confirms presence of the radioactive seed. Order number of I-125 seed:  [id number]. Total activity:  4.599 millicuries reference Date: 06/26/2019 The patient tolerated the procedure well and was released from the [facility]. She was given instructions regarding seed removal.  IMPRESSION: Radioactive seed localization of the left breast. No apparent complications. Electronically Signed   By: Lajean Manes M.D.   On: 07/07/2019 11:08      IMPRESSION: Stage 0 (cN0, M0) Left Breast UOQ, DCIS, ER- / PR-, Grade 3  Patient will be a good candidate for adjuvant radiation therapy.  Given her high-grade lesion which is ER/PR negative,  I would not recommend lumpectomy alone in this situation.  Today, I talked  to the patient and son thru phone contact about the findings and work-up thus far.  We discussed the natural history of breast cancer (DCIS) and general treatment, highlighting the role of radiotherapy in the management.  We discussed the available radiation techniques, and focused on the details of logistics and delivery.  We reviewed the anticipated acute and late sequelae associated with radiation in this setting.  The patient was encouraged to ask questions that I answered to the best of my ability.  A patient consent form was discussed and signed.  We retained a copy for our records.  The patient would like to proceed with radiation and will proceed with CT simulation later today.  PLAN: CT simulation later today.  Patient would appear to be a good candidate for hypofractionated accelerated radiation therapy.   surgical margins are greater than 1 cm and therefore not proceed with a boost.  We will use cardiac sparing techniques if needed.    ------------------------------------------------  Blair Promise, PhD, MD  This document serves as a record of services personally performed by Gery Pray, MD. It was created on his behalf by Clerance Lav, a trained medical scribe. The creation of this record is based on the scribe's personal observations and the provider's statements to them. This document has been checked and approved by the attending provider.

## 2019-07-28 NOTE — Progress Notes (Signed)
Radiation Oncology         [phone] ________________________________  Name: Rodney Dangler MRN: [id number]  Date: 07/28/2019  DOB: 10-14-1945  SIMULATION AND TREATMENT PLANNING NOTE    ICD-10-CM   1. Ductal carcinoma in situ (DCIS) of left breast  D05.12     DIAGNOSIS: Stage 0 (cN0, M0) Left Breast UOQ, DCIS, ER- / PR-, Grade 3  NARRATIVE:  The patient was brought to the [facility].  Identity was confirmed.  All relevant records and images related to the planned course of therapy were reviewed.  The patient freely provided informed written consent to proceed with treatment after reviewing the details related to the planned course of therapy. The consent form was witnessed and verified by the simulation staff.  Then, the patient was set-up in a stable reproducible supine position for radiation therapy.  CT images were obtained.  Surface markings were placed.  The CT images were loaded into the planning software.  Then the target and avoidance structures were contoured.  Treatment planning then occurred.  The radiation prescription was entered and confirmed.  Then, I designed and supervised the construction of a total of 5 medically necessary complex treatment devices.  I have requested : 3D Simulation  I have requested a DVH of the following structures: heart, lungs, lumpectomy cavity.  I have ordered:dose calc.  PLAN:  The patient will receive 42.72 Gy in 16 fractions. No boost will be given in light of the wide surgical margins (> 1.0 cm).   Optical Surface Tracking Plan:  Since intensity modulated radiotherapy (IMRT) and 3D conformal radiation treatment methods are predicated on accurate and precise positioning for treatment, intrafraction motion monitoring is medically necessary to ensure accurate and safe treatment delivery.  The ability to quantify intrafraction motion without excessive ionizing radiation dose can only be performed with optical surface tracking.  Accordingly, surface imaging offers the opportunity to obtain 3D measurements of patient position throughout IMRT and 3D treatments without excessive radiation exposure.  I am ordering optical surface tracking for this patients upcoming course of radiotherapy.   Special treatment procedure   was performed today due to the extra time and effort required by myself to plan and prepare this patient for deep inspiration breath hold technique.  I have determined cardiac sparing to be of benefit to this patient to prevent long term cardiac damage due to radiation of the heart.  Bellows were placed on the patients abdomen. To facilitate cardiac sparing, the patient was coached by the radiation therapists on breath hold techniques and breathing practice was performed. Practice waveforms were obtained. The patient was then scanned while maintaining breath hold in the treatment position.  This image was then transferred over to the imaging specialist. The imaging specialist then created a fusion of the free breathing and breath hold scans using the chest wall as the stable structure. I personally reviewed the fusion in axial, coronal and sagittal image planes.  Excellent cardiac sparing was obtained.  I felt the patient is an appropriate candidate for breath hold and the patient will be treated as such.  The image fusion was then reviewed with the patient to reinforce the necessity of reproducible breath hold. ________________________________    Blair Promise, PhD, MD  This document serves as a record of services personally performed by Gery Pray, MD. It was created on his behalf by Clerance Lav, a trained medical scribe. The creation of this record is based on the scribe's personal observations and  the provider's statements to them. This document has been checked and approved by the attending provider.

## 2019-07-28 NOTE — Progress Notes (Signed)
Location of Breast Cancer: ductal carcinoma in situ of the left breast  Histology per Pathology Report: 07/08/19: FINAL MICROSCOPIC DIAGNOSIS:   A. BREAST, LEFT, LUMPECTOMY:  - High-grade ductal carcinoma in situ, 1.5 cm.  - Margins not involved.  - DCIS focally 0.5 cm from the inferior margin.  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  - Biopsy site and biopsy clip.  - See oncology table and comment.   B. BREAST, LEFT ADDITIONAL SUPERIOR MARGIN, EXCISION:  - Fibrocystic changes with calcifications.  - No malignancy identified.  - Final superior margin clear.   C. BREAST, LEFT ADDITIONAL INFERIOR MARGIN, EXCISION:  - Fibrocystic changes with usual ductal hyperplasia and calcifications.  - No malignancy identified.  - Final inferior margin clear.   D. BREAST, LEFT ADDITIONAL MEDIAL MARGIN, EXCISION:  - Fibrocystic changes with usual ductal hyperplasia and calcifications.  - No malignancy identified.  - Final medial margin clear.   E. BREAST, LEFT ADDITIONAL LATERAL MARGIN, EXCISION:  - Fibrocystic changes with usual ductal hyperplasia and calcifications.  - Radial sclerosing lesion with calcifications.  - No malignancy identified.  - Final lateral margin clear.   Receptor Status: ER(0%), PR (0%)  Did patient present with symptoms (if so, please note symptoms) or was this found on screening mammography?: Screening mammogram on 05/21/19 showed left breast calcifications. Diagnostic mammogram on 05/27/19 showed left breast calcifications spanning 1.1cm. Biopsy on 06/04/19 showed DCIS with apocrine features, calcifications, and necrosis, high grade, ER/PR negative. She underwent a left lumpectomy on 07/08/19 with Dr. Marlou Starks for which pathology showed high grade DCIS, 1.5cm, clear margins.  Past/Anticipated interventions by surgeon, if any: 07/08/19: PROCEDURE:  Procedure(s): LEFT BREAST LUMPECTOMY WITH RADIOACTIVE SEED LOCALIZATION (Left)  SURGEON:   Surgeon(s) and Role:    Jovita Kussmaul, MD - Primary  Past/Anticipated interventions by medical oncology, if any: Chemotherapy Per Dr. Lindi Adie 07/22/19: Recommendation: adjuvant radiation therapy followed by surveillance.  I informed her that DCIS is different than invasive breast cancer.  She does not have to worry about metastatic disease with DCIS. Her risk of recurrence after radiation would be about 7%. There is no role of antiestrogen therapy since she is ER/PR negative.  We will see her back in 4 months for survivorship care plan visit. After that I can see her once a year.  Lymphedema issues, if any: Pt denies s/s lymphedema. Pt does have some swelling in breast from surgery.  Pain issues, if any:  Pt reports pain in knee, rated 4-5/10.  SAFETY ISSUES:  Prior radiation? YES. Radioactive Iodine for thyroid in 1985.   Pacemaker/ICD? no  Possible current pregnancy?no  Is the patient on methotrexate? no  Current Complaints / other details:  Pt presents today for initial consult with Dr. Sondra Come for Radiation Oncology. Pt seems distressed that XRT is daily. Pt to have son on speaker phone during appt. Son resides in [place].  BP (!) 110/55 (BP Location: Left Arm, Patient Position: Sitting, Cuff Size: Normal)   Pulse 84   Temp 98.3 F (36.8 C)   Resp 20   Wt 194 lb 6.4 oz (88.2 kg)   SpO2 100%   BMI 31.38 kg/m   Wt Readings from Last 3 Encounters:  07/28/19 194 lb 6.4 oz (88.2 kg)  07/22/19 197 lb 4.8 oz (89.5 kg)  07/17/19 187 lb (84.8 kg)       Loma Sousa, RN 07/28/2019,12:55 PM

## 2019-07-28 NOTE — Patient Instructions (Signed)
Coronavirus (COVID-19) Are you at risk?  Are you at risk for the Coronavirus (COVID-19)?  To be considered HIGH RISK for Coronavirus (COVID-19), you have to meet the following criteria:  . Traveled to [place], [place], [place], [place] or [place]; or in the [place] to [place], [place], [place], or [place]; and have fever, cough, and shortness of breath within the last 2 weeks of travel OR . Been in close contact with a person diagnosed with COVID-19 within the last 2 weeks and have fever, cough, and shortness of breath . IF YOU DO NOT MEET THESE CRITERIA, YOU ARE CONSIDERED LOW RISK FOR COVID-19.  What to do if you are HIGH RISK for COVID-19?  . If you are having a medical emergency, call 911. . Seek medical care right away. Before you go to a doctor's office, urgent care or emergency department, call ahead and tell them about your recent travel, contact with someone diagnosed with COVID-19, and your symptoms. You should receive instructions from your physician's office regarding next steps of care.  . When you arrive at healthcare provider, tell the healthcare staff immediately you have returned from visiting [place], [place], [place], [place] or [place]; or traveled in the [place] to [place], [place], [place], or [place]; in the last two weeks or you have been in close contact with a person diagnosed with COVID-19 in the last 2 weeks.   . Tell the health care staff about your symptoms: fever, cough and shortness of breath. . After you have been seen by a medical provider, you will be either: o Tested for (COVID-19) and discharged home on quarantine except to seek medical care if symptoms worsen, and asked to  - Stay home and avoid contact with others until you get your results (4-5 days)  - Avoid travel on public transportation if possible (such as bus, train, or airplane) or o Sent to the Emergency Department by EMS for evaluation, COVID-19 testing, and possible  admission depending on your condition and test results.  What to do if you are LOW RISK for COVID-19?  Reduce your risk of any infection by using the same precautions used for avoiding the common cold or flu:  . Wash your hands often with soap and warm water for at least 20 seconds.  If soap and water are not readily available, use an alcohol-based hand sanitizer with at least 60% alcohol.  . If coughing or sneezing, cover your mouth and nose by coughing or sneezing into the elbow areas of your shirt or coat, into a tissue or into your sleeve (not your hands). . Avoid shaking hands with others and consider head nods or verbal greetings only. . Avoid touching your eyes, nose, or mouth with unwashed hands.  . Avoid close contact with people who are sick. . Avoid places or events with large numbers of people in one location, like concerts or sporting events. . Carefully consider travel plans you have or are making. . If you are planning any travel outside or inside the [place], visit the CDC's Travelers' Health webpage for the latest health notices. . If you have some symptoms but not all symptoms, continue to monitor at home and seek medical attention if your symptoms worsen. . If you are having a medical emergency, call 911.   ADDITIONAL HEALTHCARE OPTIONS FOR PATIENTS  [facility] / e-Visit: [email or web address]         [facility]: [phone]  [facility]: [phone]                   [facility]: [phone]   

## 2019-07-29 ENCOUNTER — Encounter: Payer: Self-pay | Admitting: Radiation Oncology

## 2019-07-29 ENCOUNTER — Encounter: Payer: Self-pay | Admitting: *Deleted

## 2019-07-29 DIAGNOSIS — Z510 Encounter for antineoplastic radiation therapy: Secondary | ICD-10-CM | POA: Diagnosis not present

## 2019-07-29 DIAGNOSIS — D0512 Intraductal carcinoma in situ of left breast: Secondary | ICD-10-CM | POA: Diagnosis not present

## 2019-07-31 ENCOUNTER — Telehealth: Payer: Self-pay | Admitting: Allergy & Immunology

## 2019-07-31 NOTE — Telephone Encounter (Signed)
I called Kara [name] to see how she was faring after her surgery. She tolerated the surgery without a problem and is very happy with the scar. She is now going to be receiving four weeks of radiation (five days per week).  While she is not looking forward to this, she is happy to have the end in sight.  She continues to be very grateful for our services.  She tells me that she reports her self with crab cakes and tree nuts, which would have been possible without our challenges. She is very happy with the nursing staff at the [facility] office and wants to make sure that I am aware of this. I assured her that I would pass this on to the appropriate people.   Salvatore Marvel, MD [facility]

## 2019-08-01 NOTE — Telephone Encounter (Signed)
I am glad she is doing great. We will miss seeing here each week.  Thanks

## 2019-08-04 ENCOUNTER — Ambulatory Visit: Payer: Medicare Other | Admitting: Radiation Oncology

## 2019-08-05 ENCOUNTER — Ambulatory Visit: Payer: Medicare Other

## 2019-08-06 ENCOUNTER — Ambulatory Visit: Payer: Medicare Other

## 2019-08-07 ENCOUNTER — Ambulatory Visit: Payer: Medicare Other

## 2019-08-08 ENCOUNTER — Ambulatory Visit: Payer: Medicare Other

## 2019-08-11 ENCOUNTER — Telehealth: Payer: Self-pay | Admitting: Genetic Counselor

## 2019-08-11 ENCOUNTER — Encounter: Payer: Self-pay | Admitting: Genetic Counselor

## 2019-08-11 ENCOUNTER — Other Ambulatory Visit: Payer: Self-pay

## 2019-08-11 ENCOUNTER — Ambulatory Visit
Admission: RE | Admit: 2019-08-11 | Discharge: 2019-08-11 | Disposition: A | Discharge status: Home / Self Care | Payer: Medicare Other | Source: Ambulatory Visit | Attending: Radiation Oncology | Admitting: Radiation Oncology

## 2019-08-11 DIAGNOSIS — D0512 Intraductal carcinoma in situ of left breast: Secondary | ICD-10-CM

## 2019-08-11 DIAGNOSIS — Z1379 Encounter for other screening for genetic and chromosomal anomalies: Secondary | ICD-10-CM | POA: Insufficient documentation

## 2019-08-11 DIAGNOSIS — Z510 Encounter for antineoplastic radiation therapy: Secondary | ICD-10-CM | POA: Diagnosis not present

## 2019-08-11 HISTORY — DX: Encounter for other screening for genetic and chromosomal anomalies: Z13.79

## 2019-08-11 NOTE — Telephone Encounter (Signed)
LM on VM that results are back and to please call for results.  Left CB instructions.

## 2019-08-11 NOTE — Progress Notes (Signed)
  Radiation Oncology         [phone] ________________________________  Name: Kara [name] MRN: [id number]  Date: 08/11/2019  DOB: 04/30/1946  Simulation Verification Note    ICD-10-CM   1. Ductal carcinoma in situ (DCIS) of left breast  D05.12     NARRATIVE: The patient was brought to the treatment unit and placed in the planned treatment position. The clinical setup was verified. Then port films were obtained and uploaded to the radiation oncology medical record software.  The treatment beams were carefully compared against the planned radiation fields. The position location and shape of the radiation fields was reviewed. They targeted volume of tissue appears to be appropriately covered by the radiation beams. Organs at risk appear to be excluded as planned.  Based on my personal review, I approved the simulation verification. The patient's treatment will proceed as planned.  -----------------------------------  Blair Promise, PhD, MD  This document serves as a record of services personally performed by Gery Pray, MD. It was created on his behalf by Clerance Lav, a trained medical scribe. The creation of this record is based on the scribe's personal observations and the provider's statements to them. This document has been checked and approved by the attending provider.

## 2019-08-12 ENCOUNTER — Other Ambulatory Visit: Payer: Self-pay

## 2019-08-12 ENCOUNTER — Ambulatory Visit
Admission: RE | Admit: 2019-08-12 | Discharge: 2019-08-12 | Disposition: A | Discharge status: Home / Self Care | Payer: Medicare Other | Source: Ambulatory Visit | Attending: Radiation Oncology | Admitting: Radiation Oncology

## 2019-08-12 DIAGNOSIS — Z510 Encounter for antineoplastic radiation therapy: Secondary | ICD-10-CM | POA: Diagnosis not present

## 2019-08-12 DIAGNOSIS — D0512 Intraductal carcinoma in situ of left breast: Secondary | ICD-10-CM | POA: Diagnosis not present

## 2019-08-12 NOTE — Telephone Encounter (Signed)
LM on VM that we have results back.  I will be OOO until Thrusday and will try to reach her then.

## 2019-08-13 ENCOUNTER — Ambulatory Visit
Admission: RE | Admit: 2019-08-13 | Discharge: 2019-08-13 | Disposition: A | Discharge status: Home / Self Care | Payer: Medicare Other | Source: Ambulatory Visit | Attending: Radiation Oncology | Admitting: Radiation Oncology

## 2019-08-13 ENCOUNTER — Other Ambulatory Visit: Payer: Self-pay

## 2019-08-13 DIAGNOSIS — D0512 Intraductal carcinoma in situ of left breast: Secondary | ICD-10-CM | POA: Diagnosis not present

## 2019-08-13 DIAGNOSIS — Z510 Encounter for antineoplastic radiation therapy: Secondary | ICD-10-CM | POA: Diagnosis not present

## 2019-08-14 ENCOUNTER — Other Ambulatory Visit: Payer: Self-pay

## 2019-08-14 ENCOUNTER — Ambulatory Visit
Admission: RE | Admit: 2019-08-14 | Discharge: 2019-08-14 | Disposition: A | Discharge status: Home / Self Care | Payer: Medicare Other | Source: Ambulatory Visit | Attending: Radiation Oncology | Admitting: Radiation Oncology

## 2019-08-14 DIAGNOSIS — Z510 Encounter for antineoplastic radiation therapy: Secondary | ICD-10-CM | POA: Insufficient documentation

## 2019-08-14 DIAGNOSIS — D0512 Intraductal carcinoma in situ of left breast: Secondary | ICD-10-CM | POA: Diagnosis not present

## 2019-08-14 NOTE — Telephone Encounter (Signed)
LM on VM that reuslts are back.  Patient had called to ask that I call later in the afternoon.  Called at 4:10 PM.

## 2019-08-15 ENCOUNTER — Telehealth: Payer: Self-pay | Admitting: Genetic Counselor

## 2019-08-15 ENCOUNTER — Ambulatory Visit
Admission: RE | Admit: 2019-08-15 | Discharge: 2019-08-15 | Disposition: A | Discharge status: Home / Self Care | Payer: Medicare Other | Source: Ambulatory Visit | Attending: Radiation Oncology | Admitting: Radiation Oncology

## 2019-08-15 ENCOUNTER — Other Ambulatory Visit: Payer: Self-pay

## 2019-08-15 DIAGNOSIS — Z510 Encounter for antineoplastic radiation therapy: Secondary | ICD-10-CM | POA: Diagnosis not present

## 2019-08-15 DIAGNOSIS — D0512 Intraductal carcinoma in situ of left breast: Secondary | ICD-10-CM | POA: Diagnosis not present

## 2019-08-15 NOTE — Telephone Encounter (Signed)
Patient called for results.  The STAT panel was negative.  The remainder of testing will be back in the next week or so.  I will call with those results and she can pick up a copy of the results after her treatment.

## 2019-08-15 NOTE — Telephone Encounter (Signed)
The patient and I keep playing phone tag.  I LM on VM that I will be in the office until 3 PM today.  If we miss each other I will be here on Monday after her radiation appointment and she can stop by the office and pick up a copy of her result and we can discuss her testing.

## 2019-08-16 ENCOUNTER — Ambulatory Visit
Admission: RE | Admit: 2019-08-16 | Discharge: 2019-08-16 | Disposition: A | Discharge status: Home / Self Care | Payer: Medicare Other | Source: Ambulatory Visit | Attending: Orthopaedic Surgery | Admitting: Orthopaedic Surgery

## 2019-08-16 DIAGNOSIS — M25561 Pain in right knee: Secondary | ICD-10-CM

## 2019-08-16 DIAGNOSIS — G8929 Other chronic pain: Secondary | ICD-10-CM

## 2019-08-18 ENCOUNTER — Other Ambulatory Visit: Payer: Self-pay

## 2019-08-18 ENCOUNTER — Ambulatory Visit
Admission: RE | Admit: 2019-08-18 | Discharge: 2019-08-18 | Disposition: A | Discharge status: Home / Self Care | Payer: Medicare Other | Source: Ambulatory Visit | Attending: Radiation Oncology | Admitting: Radiation Oncology

## 2019-08-18 DIAGNOSIS — Z510 Encounter for antineoplastic radiation therapy: Secondary | ICD-10-CM | POA: Diagnosis not present

## 2019-08-18 DIAGNOSIS — D0512 Intraductal carcinoma in situ of left breast: Secondary | ICD-10-CM | POA: Diagnosis not present

## 2019-08-19 ENCOUNTER — Ambulatory Visit: Payer: Self-pay | Admitting: Genetic Counselor

## 2019-08-19 ENCOUNTER — Other Ambulatory Visit: Payer: Self-pay

## 2019-08-19 ENCOUNTER — Telehealth: Payer: Self-pay | Admitting: Genetic Counselor

## 2019-08-19 ENCOUNTER — Ambulatory Visit
Admission: RE | Admit: 2019-08-19 | Discharge: 2019-08-19 | Disposition: A | Discharge status: Home / Self Care | Payer: Medicare Other | Source: Ambulatory Visit | Attending: Radiation Oncology | Admitting: Radiation Oncology

## 2019-08-19 DIAGNOSIS — Z510 Encounter for antineoplastic radiation therapy: Secondary | ICD-10-CM | POA: Diagnosis not present

## 2019-08-19 DIAGNOSIS — D0512 Intraductal carcinoma in situ of left breast: Secondary | ICD-10-CM

## 2019-08-19 DIAGNOSIS — Z1379 Encounter for other screening for genetic and chromosomal anomalies: Secondary | ICD-10-CM

## 2019-08-19 NOTE — Telephone Encounter (Signed)
LM on VM that results are back and to please call.  I let her know that I was working from home today but will be in the office tomorrow through Friday.  She can stop by my office and pick up a copy of her reports and discuss the results.

## 2019-08-19 NOTE — Progress Notes (Signed)
HPI:  Kara [name] was previously seen in the [facility] due to a personal and family history of breast cancer and concerns regarding a hereditary predisposition to cancer. Please refer to our prior cancer genetics clinic note for more information regarding our discussion, assessment and recommendations, at the time. Kara [name]'s recent genetic test results were disclosed to her, as were recommendations warranted by these results. These results and recommendations are discussed in more detail below.  CANCER HISTORY:  Oncology History  Ductal carcinoma in situ (DCIS) of left breast  06/04/2019 Cancer Staging   Staging form: Breast, AJCC 8th Edition - Clinical stage from 06/04/2019: Stage 0 (cTis (DCIS), cN0, cM0, ER-, PR-) - Signed by Causey, Lindsey Cornetto, NP on 06/11/2019   06/11/2019 Initial Diagnosis   Screening mammogram showed left breast calcifications. Diagnostic mammogram showed left breast calcifications spanning 1.1cm. Biopsy showed DCIS with apocrine features, calcifications, and necrosis, high grade, ER/PR [name].   07/08/2019 Surgery   Left lumpectomy ([facility]): high grade DCIS, 1.5cm, clear margins.   07/08/2019 Cancer Staging   Staging form: Breast, AJCC 8th Edition - Pathologic stage from 07/08/2019: Stage 0 (pTis (DCIS), pN0, cM0, ER-, PR-) - Signed by Causey, Lindsey Cornetto, NP on 07/23/2019   08/09/2019 Genetic Testing   [name] genetic testing on the [facility] 9-gene STAT panel.  The STAT Breast cancer panel offered by [facility] includes sequencing and rearrangement analysis for the following 9 genes:  ATM, BRCA1, BRCA2, CDH1, CHEK2, PALB2, PTEN, STK11 and TP53.   The report date is August 09, 2019.     FAMILY HISTORY:  We obtained a detailed, 4-generation family history.  Significant diagnoses are listed below: Family History  Problem Relation Age of Onset  . Breast cancer Maternal Grandmother        dx over 50  . Prostate cancer Maternal Grandfather   .  Arthritis Mother   . COPD Mother   . Breast cancer Mother 72       second at age 78  . Cancer Father        throat  . Stroke Father   . Cancer Maternal Aunt 30       [name], cervical cancer  . Breast cancer Maternal Aunt        dx over 50  . Breast cancer Paternal Aunt        dx under 50  . Prostate cancer Maternal Uncle        dx over 50  . Cancer Other        father's maternal cousin was Henreietta Lacks, Cervical  . Cancer Maternal Uncle        unknown cancer  . Breast cancer Paternal Aunt        dx over 50  . Colon cancer Neg Hx   . Allergic rhinitis Neg Hx   . Angioedema Neg Hx   . Asthma Neg Hx   . Atopy Neg Hx   . Eczema Neg Hx   . Urticaria Neg Hx   . Immunodeficiency Neg Hx     The patient has two sons who are cancer free.  She has one brother who is cancer free.  Her mother is living and her father is deceased.    Her mother had breast cancer at 72 and 78.  She had five sister and five brothers.  One sister had breast cancer and two brothers had prostate cancer.  The grandmother had breast cancer and the grandfather had prostate cancer.    The patient's father had two brothers and two sisters.  Both sisters had breast cancer, one under age 50.  His maternal first cousin was [name] who had cervical cancer under 50.  Kara [name] is unaware of previous family history of genetic testing for hereditary cancer risks. Patient's maternal ancestors are of African American descent, and paternal ancestors are of African American descent. There is no reported Ashkenazi Jewish ancestry. There is no known consanguinity.   GENETIC TEST RESULTS: Genetic testing reported out on August 15, 2019 through the Multi-cancer panel found no pathogenic mutations. The Multi-Gene Panel offered by [facility] includes sequencing and/or deletion duplication testing of the following 85 genes: AIP, ALK, APC, ATM, AXIN2,BAP1,  BARD1, BLM, BMPR1A, BRCA1, BRCA2, BRIP1, CASR, CDC73, CDH1,  CDK4, CDKN1B, CDKN1C, CDKN2A (p14ARF), CDKN2A (p16INK4a), CEBPA, CHEK2, CTNNA1, DICER1, DIS3L2, EGFR (c.2369C>T, p.Thr790Met variant only), EPCAM (Deletion/duplication testing only), FH, FLCN, GATA2, GPC3, GREM1 (Promoter region deletion/duplication testing only), HOXB13 (c.251G>A, p.Gly84Glu), HRAS, KIT, MAX, MEN1, MET, MITF (c.952G>A, p.Glu318Lys variant only), MLH1, MSH2, MSH3, MSH6, MUTYH, NBN, NF1, NF2, NTHL1, PALB2, PDGFRA, PHOX2B, PMS2, POLD1, POLE, POT1, PRKAR1A, PTCH1, PTEN, RAD50, RAD51C, RAD51D, RB1, RECQL4, RET, RNF43, RUNX1, SDHAF2, SDHA (sequence changes only), SDHB, SDHC, SDHD, SMAD4, SMARCA4, SMARCB1, SMARCE1, STK11, SUFU, TERC, TERT, TMEM127, TP53, TSC1, TSC2, VHL, WRN and WT1. The test report has been scanned into EPIC and is located under the Molecular Pathology section of the Results Review tab.  A portion of the result report is included below for reference.     We discussed with Kara [name] that because current genetic testing is not perfect, it is possible there may be a gene mutation in one of these genes that current testing cannot detect, but that chance is small.  We also discussed, that there could be another gene that has not yet been discovered, or that we have not yet tested, that is responsible for the cancer diagnoses in the family. It is also possible there is a hereditary cause for the cancer in the family that Kara [name] did not inherit and therefore was not identified in her testing.  Therefore, it is important to remain in touch with cancer genetics in the future so that we can continue to offer Kara [name] the most up to date genetic testing.   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  At this time, it is unknown if these variants are associated with increased cancer risk or if they are normal findings, but most variants such as these get reclassified to being  inconsequential. They should not be used to make medical management decisions. With time, we suspect the lab will determine the significance of these variants, if any. If we do learn more about them, we will try to contact Kara [name] to discuss it further. However, it is important to stay in touch with us periodically and keep the address and phone number up to date.  ADDITIONAL GENETIC TESTING: We discussed with Kara [name] that her genetic testing was fairly extensive.  If there are genes identified to increase cancer risk that can be analyzed in the future, we would be happy to discuss and coordinate this testing at that time.    CANCER SCREENING RECOMMENDATIONS: Kara [name] (normal).  This means that we have not identified a hereditary cause for her personal and family history of breast cancer at this   time. Most cancers happen by chance and this [name] test suggests that her cancer may fall into this category.    While reassuring, this does not definitively rule out a hereditary predisposition to cancer. It is still possible that there could be genetic mutations that are undetectable by current technology. There could be genetic mutations in genes that have not been tested or identified to increase cancer risk.  Therefore, it is recommended she continue to follow the cancer management and screening guidelines provided by her oncology and primary healthcare provider.   An individual's cancer risk and medical management are not determined by genetic test results alone. Overall cancer risk assessment incorporates additional factors, including personal medical history, family history, and any available genetic information that may result in a personalized plan for cancer prevention and surveillance.  RECOMMENDATIONS FOR FAMILY MEMBERS:  Individuals in this family might be at some increased risk of developing cancer, over the general population risk, simply due to the family  history of cancer.  We recommended women in this family have a yearly mammogram beginning at age 55, or 38 years younger than the earliest onset of cancer, an annual clinical breast exam, and perform monthly breast self-exams. Women in this family should also have a gynecological exam as recommended by their primary provider. All family members should have a colonoscopy by age 7.  FOLLOW-UP: Lastly, we discussed with Kara [name] that cancer genetics is a rapidly advancing field and it is possible that new genetic tests will be appropriate for her and/or her family members in the future. We encouraged her to remain in contact with cancer genetics on an annual basis so we can update her personal and family histories and let her know of advances in cancer genetics that may benefit this family.   Our contact number was provided. Kara [name] questions were answered to her satisfaction, and she knows she is welcome to call us at anytime with additional questions or concerns.   Roma Kayser, [place], [facility] Licensed, Certified Genetic Counselor [email or web address]

## 2019-08-20 ENCOUNTER — Ambulatory Visit
Admission: RE | Admit: 2019-08-20 | Discharge: 2019-08-20 | Disposition: A | Discharge status: Home / Self Care | Payer: Medicare Other | Source: Ambulatory Visit | Attending: Radiation Oncology | Admitting: Radiation Oncology

## 2019-08-20 ENCOUNTER — Other Ambulatory Visit: Payer: Self-pay

## 2019-08-20 DIAGNOSIS — D0512 Intraductal carcinoma in situ of left breast: Secondary | ICD-10-CM | POA: Diagnosis not present

## 2019-08-20 DIAGNOSIS — Z510 Encounter for antineoplastic radiation therapy: Secondary | ICD-10-CM | POA: Diagnosis not present

## 2019-08-21 ENCOUNTER — Ambulatory Visit
Admission: RE | Admit: 2019-08-21 | Discharge: 2019-08-21 | Disposition: A | Discharge status: Home / Self Care | Payer: Medicare Other | Source: Ambulatory Visit | Attending: Radiation Oncology | Admitting: Radiation Oncology

## 2019-08-21 ENCOUNTER — Other Ambulatory Visit: Payer: Self-pay

## 2019-08-21 ENCOUNTER — Ambulatory Visit (INDEPENDENT_AMBULATORY_CARE_PROVIDER_SITE_OTHER): Payer: Medicare Other | Admitting: Orthopaedic Surgery

## 2019-08-21 ENCOUNTER — Encounter: Payer: Self-pay | Admitting: Orthopaedic Surgery

## 2019-08-21 DIAGNOSIS — D0512 Intraductal carcinoma in situ of left breast: Secondary | ICD-10-CM | POA: Diagnosis not present

## 2019-08-21 DIAGNOSIS — Z510 Encounter for antineoplastic radiation therapy: Secondary | ICD-10-CM | POA: Diagnosis not present

## 2019-08-21 DIAGNOSIS — G8929 Other chronic pain: Secondary | ICD-10-CM

## 2019-08-21 DIAGNOSIS — M25561 Pain in right knee: Secondary | ICD-10-CM | POA: Diagnosis not present

## 2019-08-21 DIAGNOSIS — M23251 Derangement of posterior horn of lateral meniscus due to old tear or injury, right knee: Secondary | ICD-10-CM | POA: Diagnosis not present

## 2019-08-21 NOTE — Progress Notes (Signed)
Virtual Visit via Telephone Note  I connected with@ on 08/21/19 at 10:40 AM EDT by telephone and verified that I am speaking with the correct person using two identifiers.  Location: Patient: home Provider: [place]   I discussed the limitations, risks, security and privacy concerns of performing an evaluation and management service by telephone and the availability of in person appointments. I also discussed with the patient that there may be a patient responsible charge related to this service. The patient expressed understanding and agreed to proceed.   History of Present Illness: She has continued pain of the right knee.  It is worse.  She is getting radiation therapy and is in the process of having one in just a short time from now.  Her MRI showed: IMPRESSION: 1. Inferior articular surface tears involving the posterior horn and mid body region of the lateral meniscus with associated large parameniscal cyst. 2. Intact ligamentous structures and no acute bony findings. 3. Tricompartmental degenerative changes most significant in the lateral compartment. 4. No joint effusion or Baker's cyst.  I have told her she is a candidate for surgery.  But she would have to complete her cancer treatments.  I can inject the knee but she would have to come to the office.  I could have her wait in her car and I can come out and do it in the car if that would be easier for her.  She has basically worn out the knee.   Observations/Objective: Per above.  Assessment and Plan: Encounter Diagnosis  Name Primary?  . Chronic pain of right knee Yes     Follow Up Instructions: To call.  She will look this up on MyChart and call and tell me what she would like to do.   I discussed the assessment and treatment plan with the patient. The patient was provided an opportunity to ask questions and all were answered. The patient agreed with the plan and demonstrated an understanding of the  instructions.   The patient was advised to call back or seek an in-person evaluation if the symptoms worsen or if the condition fails to improve as anticipated.  I provided 8 minutes of non-face-to-face time during this encounter, including preparation time.   Sanjuana Kava, MD

## 2019-08-22 ENCOUNTER — Ambulatory Visit
Admission: RE | Admit: 2019-08-22 | Discharge: 2019-08-22 | Disposition: A | Discharge status: Home / Self Care | Payer: Medicare Other | Source: Ambulatory Visit | Attending: Radiation Oncology | Admitting: Radiation Oncology

## 2019-08-22 ENCOUNTER — Other Ambulatory Visit: Payer: Self-pay

## 2019-08-22 DIAGNOSIS — D0512 Intraductal carcinoma in situ of left breast: Secondary | ICD-10-CM | POA: Diagnosis not present

## 2019-08-22 DIAGNOSIS — Z510 Encounter for antineoplastic radiation therapy: Secondary | ICD-10-CM | POA: Diagnosis not present

## 2019-08-24 ENCOUNTER — Encounter: Payer: Self-pay | Admitting: Family Medicine

## 2019-08-25 ENCOUNTER — Other Ambulatory Visit: Payer: Self-pay

## 2019-08-25 ENCOUNTER — Ambulatory Visit
Admission: RE | Admit: 2019-08-25 | Discharge: 2019-08-25 | Disposition: A | Discharge status: Home / Self Care | Payer: Medicare Other | Source: Ambulatory Visit | Attending: Radiation Oncology | Admitting: Radiation Oncology

## 2019-08-25 ENCOUNTER — Encounter: Payer: Self-pay | Admitting: *Deleted

## 2019-08-25 DIAGNOSIS — D0512 Intraductal carcinoma in situ of left breast: Secondary | ICD-10-CM | POA: Diagnosis not present

## 2019-08-25 DIAGNOSIS — Z510 Encounter for antineoplastic radiation therapy: Secondary | ICD-10-CM | POA: Diagnosis not present

## 2019-08-26 ENCOUNTER — Other Ambulatory Visit: Payer: Self-pay

## 2019-08-26 ENCOUNTER — Ambulatory Visit: Payer: Medicare Other

## 2019-08-26 ENCOUNTER — Ambulatory Visit
Admission: RE | Admit: 2019-08-26 | Discharge: 2019-08-26 | Disposition: A | Discharge status: Home / Self Care | Payer: Medicare Other | Source: Ambulatory Visit | Attending: Radiation Oncology | Admitting: Radiation Oncology

## 2019-08-26 DIAGNOSIS — D0512 Intraductal carcinoma in situ of left breast: Secondary | ICD-10-CM | POA: Diagnosis not present

## 2019-08-26 DIAGNOSIS — Z510 Encounter for antineoplastic radiation therapy: Secondary | ICD-10-CM | POA: Diagnosis not present

## 2019-08-27 ENCOUNTER — Encounter: Payer: Self-pay | Admitting: Radiation Oncology

## 2019-08-27 ENCOUNTER — Ambulatory Visit
Admission: RE | Admit: 2019-08-27 | Discharge: 2019-08-27 | Disposition: A | Discharge status: Home / Self Care | Payer: Medicare Other | Source: Ambulatory Visit | Attending: Radiation Oncology | Admitting: Radiation Oncology

## 2019-08-27 ENCOUNTER — Other Ambulatory Visit: Payer: Self-pay

## 2019-08-27 DIAGNOSIS — Z510 Encounter for antineoplastic radiation therapy: Secondary | ICD-10-CM | POA: Diagnosis not present

## 2019-08-27 DIAGNOSIS — D0512 Intraductal carcinoma in situ of left breast: Secondary | ICD-10-CM | POA: Diagnosis not present

## 2019-08-28 ENCOUNTER — Ambulatory Visit
Admission: RE | Admit: 2019-08-28 | Discharge: 2019-08-28 | Disposition: A | Discharge status: Home / Self Care | Payer: Medicare Other | Source: Ambulatory Visit | Attending: Radiation Oncology | Admitting: Radiation Oncology

## 2019-08-28 ENCOUNTER — Other Ambulatory Visit: Payer: Self-pay

## 2019-08-28 ENCOUNTER — Telehealth: Payer: Self-pay

## 2019-08-28 DIAGNOSIS — Z510 Encounter for antineoplastic radiation therapy: Secondary | ICD-10-CM | POA: Diagnosis not present

## 2019-08-28 DIAGNOSIS — D0512 Intraductal carcinoma in situ of left breast: Secondary | ICD-10-CM | POA: Diagnosis not present

## 2019-08-28 NOTE — Telephone Encounter (Signed)
Contacted pt to determine what wound dressing she was requesting. Pt is requesting Sonafine. Conveyed to pt that this RN would have a tube on L4 for pt today. Pt was informed that she should continue to use non-metallic deodorant until XRT has completed and then can switch to deodorant of choice. Pt verbalized understanding. Loma Sousa, RN BSN

## 2019-08-29 ENCOUNTER — Ambulatory Visit
Admission: RE | Admit: 2019-08-29 | Discharge: 2019-08-29 | Disposition: A | Discharge status: Home / Self Care | Payer: Medicare Other | Source: Ambulatory Visit | Attending: Radiation Oncology | Admitting: Radiation Oncology

## 2019-08-29 ENCOUNTER — Other Ambulatory Visit: Payer: Self-pay

## 2019-08-29 DIAGNOSIS — D0512 Intraductal carcinoma in situ of left breast: Secondary | ICD-10-CM | POA: Diagnosis not present

## 2019-08-29 DIAGNOSIS — Z510 Encounter for antineoplastic radiation therapy: Secondary | ICD-10-CM | POA: Diagnosis not present

## 2019-09-01 ENCOUNTER — Encounter: Payer: Self-pay | Admitting: Radiation Oncology

## 2019-09-01 ENCOUNTER — Other Ambulatory Visit: Payer: Self-pay

## 2019-09-01 ENCOUNTER — Encounter: Payer: Self-pay | Admitting: *Deleted

## 2019-09-01 ENCOUNTER — Ambulatory Visit
Admission: RE | Admit: 2019-09-01 | Discharge: 2019-09-01 | Disposition: A | Discharge status: Home / Self Care | Payer: Medicare Other | Source: Ambulatory Visit | Attending: Radiation Oncology | Admitting: Radiation Oncology

## 2019-09-01 DIAGNOSIS — D0512 Intraductal carcinoma in situ of left breast: Secondary | ICD-10-CM | POA: Diagnosis not present

## 2019-09-01 DIAGNOSIS — Z510 Encounter for antineoplastic radiation therapy: Secondary | ICD-10-CM | POA: Diagnosis not present

## 2019-09-03 ENCOUNTER — Ambulatory Visit (INDEPENDENT_AMBULATORY_CARE_PROVIDER_SITE_OTHER): Payer: Medicare Other | Admitting: Family Medicine

## 2019-09-03 ENCOUNTER — Other Ambulatory Visit: Payer: Self-pay

## 2019-09-03 ENCOUNTER — Encounter: Payer: Self-pay | Admitting: Family Medicine

## 2019-09-03 VITALS — BP 108/61 | HR 69 | Temp 97.8°F | Ht 66.0 in | Wt 203.0 lb

## 2019-09-03 DIAGNOSIS — K219 Gastro-esophageal reflux disease without esophagitis: Secondary | ICD-10-CM

## 2019-09-03 DIAGNOSIS — D0512 Intraductal carcinoma in situ of left breast: Secondary | ICD-10-CM | POA: Diagnosis not present

## 2019-09-03 DIAGNOSIS — G8929 Other chronic pain: Secondary | ICD-10-CM

## 2019-09-03 DIAGNOSIS — M25561 Pain in right knee: Secondary | ICD-10-CM | POA: Diagnosis not present

## 2019-09-03 DIAGNOSIS — R7303 Prediabetes: Secondary | ICD-10-CM

## 2019-09-03 HISTORY — DX: Other chronic pain: G89.29

## 2019-09-03 MED ORDER — ONETOUCH VERIO VI STRP: ORAL_STRIP | 1 refills | Status: DC

## 2019-09-03 NOTE — Patient Instructions (Addendum)
   Consider exercise 121min/week A1c with other labwork If you have lab work done today you will be contacted with your lab results within the next 2 weeks.  If you have not heard from [place] then please contact us. The fastest way to get your results is to register for My Chart.   IF you received an x-ray today, you will receive an invoice from [facility]. Please contact [facility] at [phone] with questions or concerns regarding your invoice.   IF you received labwork today, you will receive an invoice from [place]. Please contact [facility] at [phone] with questions or concerns regarding your invoice.   Our billing staff will not be able to assist you with questions regarding bills from these companies.  You will be contacted with the lab results as soon as they are available. The fastest way to get your results is to activate your My Chart account. Instructions are located on the last page of this paperwork. If you have not heard from [place] regarding the results in 2 weeks, please contact this office.

## 2019-09-03 NOTE — Progress Notes (Signed)
Established Patient Office Visit  Subjective:  Patient ID: Kara [name], female    DOB: 1945/07/27  Age: 74 y.o. MRN: [id number]  CC:  Chief Complaint  Patient presents with  . Follow-up    here for f/u, get refills, and labs done  ASSESSMENT AND PLAN: ONCOLOGY 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:Screening mammogram showed left breast calcifications. Diagnostic mammogram showed left breast calcifications spanning 1.1cm. Biopsy showed DCIS with apocrine features, calcifications, and necrosis, high grade, ER/PR negative.  07/08/2019:Left lumpectomy Marlou Starks): high grade DCIS, 1.5cm, clear margins.   Recommendation: adjuvant radiation therapy followed by surveillance.  I informed her that DCIS is different than invasive breast cancer.  She does not have to worry about metastatic disease with DCIS. Her risk of recurrence after radiation would be about 7%. There is no role of antiestrogen therapy since she is ER/PR negative.  We will see her back in 4 months for survivorship care plan visit. After that I can see her once a year.  IMPRESSION:MRI KNEE-right 1. Inferior articular surface tears involving the posterior horn and mid body region of the lateral meniscus with associated large parameniscal cyst. 2. Intact ligamentous structures and no acute bony findings. 3. Tricompartmental degenerative changes most significant in the lateral compartment. 4. No joint effusion or Baker's cyst.   HPI Kara [name] presents for review MRI knee, discuss f/u breast cancer, pre-diabetes-"eating more ice cream", thyroid-endo following-cancelled appt due to breast CA treatment  Past Medical History:  Diagnosis Date  . Allergy    food allergies, drug allergies  . Anemia   . Anxiety   . Arthritis   . Cataract   . Family history of breast cancer   . Family history of prostate cancer   . GERD (gastroesophageal reflux disease)   . Hyperlipidemia   . Malignant  neoplasm of upper-outer quadrant of left breast in female, estrogen receptor negative ([place]) 06/11/2019   DCIS left breast  . Pre-diabetes    no meds  . Thyroid disease     Past Surgical History:  Procedure Laterality Date  . ABDOMINAL HYSTERECTOMY     bleeding. fibroids  . BREAST LUMPECTOMY WITH RADIOACTIVE SEED LOCALIZATION Left 07/08/2019   Procedure: LEFT BREAST LUMPECTOMY WITH RADIOACTIVE SEED LOCALIZATION;  Surgeon: Jovita Kussmaul, MD;  Location: [facility];  Service: General;  Laterality: Left;  . CESAREAN SECTION    . CHOLECYSTECTOMY    . COLONOSCOPY  2011   [place]: two 3-4 mm sessile polyps, hyperplastic, sigmoid diverticula, TI appeared normal.   . ESOPHAGOGASTRODUODENOSCOPY  2011   [place]: non-bleeding erosive gastropathy, normal duodenum. path: unremarkable duodenum, negative H.pylori, minimal esophagitis   . TONSILLECTOMY  1975    Family History  Problem Relation Age of Onset  . Breast cancer Maternal Grandmother        dx over 64  . Prostate cancer Maternal Grandfather   . Arthritis Mother   . COPD Mother   . Breast cancer Mother 76       second at age 61  . Cancer Father        throat  . Stroke Father   . Cancer Maternal Aunt [address] Lacks, cervical cancer  . Breast cancer Maternal Aunt        dx over 37  . Breast cancer Paternal Aunt        dx under 75  . Prostate cancer Maternal Uncle  dx over 72  . Cancer Other        father's maternal cousin was Henreietta Lacks, Cervical  . Cancer Maternal Uncle        unknown cancer  . Breast cancer Paternal Aunt        dx over 4  . Colon cancer Neg Hx   . Allergic rhinitis Neg Hx   . Angioedema Neg Hx   . Asthma Neg Hx   . Atopy Neg Hx   . Eczema Neg Hx   . Urticaria Neg Hx   . Immunodeficiency Neg Hx     Social History   Socioeconomic History  . Marital status: Divorced    Spouse name: Not on file  . Number of children: 2  . Years of education: 68  . Highest  education level: Not on file  Occupational History  . 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 Smoking status: Former Smoker    Quit date: 06/15/1998    Years since quitting: 21.2  . Smokeless tobacco: Never Used  . Tobacco comment: quit 2002  Substance and Sexual Activity  . Alcohol use: No  . Drug use: No  . Sexual activity: Not Currently    Birth control/protection: Surgical    Comment: hyst  Other Topics Concern  . 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 Difficulty of Paying Living Expenses:   Food Insecurity:   . Worried About Charity fundraiser in the Last Year:   . Arboriculturist in the Last Year:   Transportation Needs:   . Film/video editor (Medical):   Marland Kitchen Lack of Transportation (Non-Medical):   Physical Activity:   . Days of Exercise per Week:   . Minutes of Exercise per Session:   Stress:   . Feeling of Stress :   Social Connections:   . Frequency of Communication with Friends and Family:   . Frequency of Social Gatherings with Friends and Family:   . Attends Religious Services:   . Active Member of Clubs or Organizations:   . Attends Archivist Meetings:   Marland Kitchen Marital Status:   Intimate Partner Violence:   . Fear of Current or Ex-Partner:   . Emotionally Abused:   Marland Kitchen Physically Abused:   . Sexually Abused:     Outpatient Medications Prior to Visit  Medication Sig Dispense Refill  . acetaminophen (TYLENOL) 500 MG tablet Take 500 mg by mouth every 6 (six) hours as needed.    . Ascorbic Acid (VITAMIN C WITH ROSE HIPS) 500 MG tablet Take 500 mg by mouth daily.    . cholecalciferol (VITAMIN D3) 25 MCG (1000 UNIT) tablet Take 1,000 Units by  mouth daily.    Marland Kitchen EPINEPHrine 0.3 mg/0.3 mL IJ SOAJ injection Inject 0.3 mLs (0.3 mg total) into the skin as needed. 2 each 2  . famotidine (PEPCID) 20 MG tablet Take 20 mg by mouth 2 (two) times daily.    Marland Kitchen levocetirizine (XYZAL) 5 MG tablet Take 2.5 mg by mouth every evening.     [name] VERIO test strip USE TO CHECK  BLOOD SUGAR TWICE DAILY    . SYNTHROID 112 MCG tablet Take 1 tablet (112 mcg total) by mouth daily. 90 tablet 1   No facility-administered medications prior to visit.    Allergies  Allergen Reactions  . Other Anaphylaxis    Allergic to seafood and nuts  . Prednisone Hypertension    Severe headache  . Naproxen Other (See Comments)    headache    ROS Review of Systems  Constitutional: Positive for fatigue.  Cardiovascular: Negative.   Gastrointestinal: Negative.   Musculoskeletal: Positive for arthralgias and myalgias.  Skin:       Radiation burn-breast      Objective:    Physical Exam  Constitutional: She is oriented to person, place, and time. She appears well-developed and well-nourished. No distress.  HENT:  Head: Normocephalic and atraumatic.  Eyes: Conjunctivae are normal.  Cardiovascular: Normal rate, regular rhythm, normal heart sounds and intact distal pulses.  Pulmonary/Chest: Effort normal and breath sounds normal.  Neurological: She is alert and oriented to person, place, and time.  Psychiatric: She has a normal mood and affect. Her behavior is normal.    BP 108/61 (BP Location: Left Arm, Patient Position: Sitting, Cuff Size: Normal)   Pulse 69   Temp 97.8 F (36.6 C) (Temporal)   Ht 5\' 6"  (1.676 m)   Wt 203 lb (92.1 kg)   SpO2 98%   BMI 32.77 kg/m  Wt Readings from Last 3 Encounters:  09/03/19 203 lb (92.1 kg)  07/28/19 194 lb 6.4 oz (88.2 kg)  07/22/19 197 lb 4.8 oz (89.5 kg)     Health Maintenance Due  Topic Date Due  . FOOT EXAM  Never done  . OPHTHALMOLOGY EXAM  06/01/2018  . URINE MICROALBUMIN  08/17/2018  . HEMOGLOBIN  A1C  07/15/2019    There are no preventive care reminders to display for this patient.  Lab Results  Component Value Date   TSH 0.34 (L) 02/27/2019   Lab Results  Component Value Date   WBC 6.5 04/07/2019   HGB 13.4 04/07/2019   HCT 41.8 04/07/2019   MCV 81.6 04/07/2019   PLT 204 04/07/2019   Lab Results  Component Value Date   NA 140 04/07/2019   K 4.2 04/07/2019   CO2 26 04/07/2019   GLUCOSE 103 (H) 04/07/2019   BUN 10 04/07/2019   CREATININE 0.74 04/07/2019   BILITOT 0.5 04/07/2019   ALKPHOS 95 01/19/2019   AST 17 04/07/2019   ALT 18 04/07/2019   PROT 6.5 04/07/2019   ALBUMIN 3.5 01/19/2019   CALCIUM 9.2 04/07/2019   ANIONGAP 7 01/19/2019   Lab Results  Component Value Date   CHOL 203 (H) 08/16/2017   Lab Results  Component Value Date   HDL 57 01/15/2019   Lab Results  Component Value Date   LDLCALC 85 01/15/2019   Lab Results  Component Value Date   TRIG 64 08/16/2017   Lab Results  Component Value Date   CHOLHDL 3.6 08/16/2017   Lab Results  Component Value Date   HGBA1C 5.9 01/15/2019   HGBA1C 5.9 01/15/2019      Assessment & Plan:  1. Prediabetes - Hemoglobin A1c  2. Gastroesophageal reflux disease without esophagitis pepcid as needed  3. Ductal carcinoma in situ (DCIS) of left breast Discuss on f/u-discussed oncology note-genetics noted -negative for gene, no chemo, ER/PR negative  4. Chronic pain of right knee Reviewed MRI report-pt to schedule with ortho for evaluation /discussion/treatment/PT if warranted Follow-up: pt  needs new primary care provider  35min discussion on oncology, f/u breast cancer, MRI knee, labwork, diet, exercise, GERD, f/u with primary care provider Zayda Angell Hannah Beat, MD

## 2019-09-04 ENCOUNTER — Ambulatory Visit: Payer: Medicare Other | Admitting: Family Medicine

## 2019-09-04 DIAGNOSIS — H401221 Low-tension glaucoma, left eye, mild stage: Secondary | ICD-10-CM | POA: Diagnosis not present

## 2019-09-04 DIAGNOSIS — H25013 Cortical age-related cataract, bilateral: Secondary | ICD-10-CM | POA: Diagnosis not present

## 2019-09-04 DIAGNOSIS — H40021 Open angle with borderline findings, high risk, right eye: Secondary | ICD-10-CM | POA: Diagnosis not present

## 2019-09-04 DIAGNOSIS — H04123 Dry eye syndrome of bilateral lacrimal glands: Secondary | ICD-10-CM | POA: Diagnosis not present

## 2019-09-04 DIAGNOSIS — H2513 Age-related nuclear cataract, bilateral: Secondary | ICD-10-CM | POA: Diagnosis not present

## 2019-09-08 ENCOUNTER — Encounter: Payer: Self-pay | Admitting: "Endocrinology

## 2019-09-08 ENCOUNTER — Ambulatory Visit (INDEPENDENT_AMBULATORY_CARE_PROVIDER_SITE_OTHER): Payer: Medicare Other | Admitting: "Endocrinology

## 2019-09-08 ENCOUNTER — Other Ambulatory Visit: Payer: Self-pay

## 2019-09-08 VITALS — BP 105/73 | HR 70 | Ht 66.0 in | Wt 203.6 lb

## 2019-09-08 DIAGNOSIS — E890 Postprocedural hypothyroidism: Secondary | ICD-10-CM

## 2019-09-08 DIAGNOSIS — R7303 Prediabetes: Secondary | ICD-10-CM | POA: Diagnosis not present

## 2019-09-08 LAB — POCT GLYCOSYLATED HEMOGLOBIN (HGB A1C): Hemoglobin A1C: 5.8 % — AB (ref 4.0–5.6)

## 2019-09-08 NOTE — Progress Notes (Signed)
09/08/2019, 7:01 PM                          Endocrinology follow-up note    Kara [name] is a 74 y.o.-year-old female patient being seen in follow-up for management of  RAI induced hypothyroidism referred by Kara Hancock, MD.   Past Medical History:  Diagnosis Date  . Allergy    food allergies, drug allergies  . Anemia   . Anxiety   . Arthritis   . Cataract   . Family history of breast cancer   . Family history of prostate cancer   . GERD (gastroesophageal reflux disease)   . Hyperlipidemia   . Malignant neoplasm of upper-outer quadrant of left breast in female, estrogen receptor negative ([place]) 06/11/2019   DCIS left breast  . Pre-diabetes    no meds  . Thyroid disease     Past Surgical History:  Procedure Laterality Date  . ABDOMINAL HYSTERECTOMY     bleeding. fibroids  . BREAST LUMPECTOMY WITH RADIOACTIVE SEED LOCALIZATION Left 07/08/2019   Procedure: LEFT BREAST LUMPECTOMY WITH RADIOACTIVE SEED LOCALIZATION;  Surgeon: Jovita Kussmaul, MD;  Location: [place] [facility];  Service: General;  Laterality: Left;  . CESAREAN SECTION    . CHOLECYSTECTOMY    . COLONOSCOPY  2011   [place]: two 3-4 mm sessile polyps, hyperplastic, sigmoid diverticula, TI appeared normal.   . ESOPHAGOGASTRODUODENOSCOPY  2011   [place]: non-bleeding erosive gastropathy, normal duodenum. path: unremarkable duodenum, negative H.pylori, minimal esophagitis   . TONSILLECTOMY  1975    Social History   Socioeconomic History  . Marital status: Divorced    Spouse name: Not on file  . Number of children: 2  . Years of education: 38  . Highest education level: Not on file  Occupational History  . 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 Smoking status: Former Smoker    Quit date: 06/15/1998    Years since quitting: 21.2   . Smokeless tobacco: Never Used  . Tobacco comment: quit 2002  Substance and Sexual Activity  . Alcohol use: No  . Drug use: No  . Sexual activity: Not Currently    Birth control/protection: Surgical    Comment: hyst  Other Topics Concern  . 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 Difficulty of Paying Living Expenses:   Food Insecurity:   . Worried About Charity fundraiser in the Last  Year:   . Ran Out of Food in the Last Year:   Transportation Needs:   . Film/video editor (Medical):   Marland Kitchen Lack of Transportation (Non-Medical):   Physical Activity:   . Days of Exercise per Week:   . Minutes of Exercise per Session:   Stress:   . Feeling of Stress :   Social Connections:   . Frequency of Communication with Friends and Family:   . Frequency of Social Gatherings with Friends and Family:   . Attends Religious Services:   . Active Member of Clubs or Organizations:   . Attends Archivist Meetings:   Marland Kitchen Marital Status:     Family History  Problem Relation Age of Onset  . Breast cancer Maternal Grandmother        dx over 61  . Prostate cancer Maternal Grandfather   . Arthritis Mother   . COPD Mother   . Breast cancer Mother 34       second at age 20  . Cancer Father        throat  . Stroke Father   . Cancer Maternal Aunt [address] Lacks, cervical cancer  . Breast cancer Maternal Aunt        dx over 54  . Breast cancer Paternal Aunt        dx under 77  . Prostate cancer Maternal Uncle        dx over 75  . Cancer Other        father's maternal cousin was Henreietta Lacks, Cervical  . Cancer Maternal Uncle        unknown cancer  . Breast cancer Paternal Aunt        dx over 9  . Colon cancer  Neg Hx   . Allergic rhinitis Neg Hx   . Angioedema Neg Hx   . Asthma Neg Hx   . Atopy Neg Hx   . Eczema Neg Hx   . Urticaria Neg Hx   . Immunodeficiency Neg Hx     Outpatient Encounter Medications as of 09/08/2019  Medication Sig  . acetaminophen (TYLENOL) 500 MG tablet Take 500 mg by mouth every 6 (six) hours as needed.  . Ascorbic Acid (VITAMIN C WITH ROSE HIPS) 500 MG tablet Take 500 mg by mouth daily.  . cholecalciferol (VITAMIN D3) 25 MCG (1000 UNIT) tablet Take 1,000 Units by mouth daily.  Marland Kitchen EPINEPHrine 0.3 mg/0.3 mL IJ SOAJ injection Inject 0.3 mLs (0.3 mg total) into the skin as needed.  . famotidine (PEPCID) 20 MG tablet Take 20 mg by mouth 2 (two) times daily as needed.   Marland Kitchen levocetirizine (XYZAL) 5 MG tablet Take 2.5 mg by mouth every evening.   [name] VERIO test strip USE TO CHECK BLOOD SUGAR TWICE DAILY  . SYNTHROID 112 MCG tablet Take 1 tablet (112 mcg total) by mouth daily.   No facility-administered encounter medications on file as of 09/08/2019.    ALLERGIES: Allergies  Allergen Reactions  . Other Anaphylaxis    Allergic to seafood and nuts  . Prednisone Hypertension    Severe headache  . Naproxen Other (See Comments)    headache   VACCINATION STATUS: Immunization History  Administered Date(s) Administered  . Influenza, High Dose Seasonal PF 02/08/2018, 02/17/2019  . Influenza,inj,Quad PF,6+ Mos 02/28/2017  . Influenza-Unspecified 02/28/2017  . PFIZER SARS-COV-2 Vaccination 06/06/2019, 06/24/2019  . Pneumococcal Conjugate-13 07/05/2016  . Pneumococcal Polysaccharide-23 04/29/2019  . Td 12/23/2018  HPI    Kara [name]  is a patient with the above medical history. she was diagnosed  with hyperthyroidism at approximate age of 8 years  which required I-131 thyroid ablation in environment.   -She was subsequently initiated on thyroid hormone supplement.  She is currently on levothyroxine 112 mcg p.o. daily before breakfast.  She reports compliance  to her medication. -She does not have new complaints today.   -She describes steady weight gain, on and off hot flashes.  She did denies tremors, palpitations. Pt denies feeling nodules in neck, hoarseness, dysphagia/odynophagia, SOB with lying down.  she denies family history of  thyroid disorders.  No family history of thyroid cancer.  Her father had head and neck tumor which did not appear to be related to thyroid cancer.  ROS:  Limited as above.   Physical Exam: BP 105/73   Pulse 70   Ht 5\' 6"  (1.676 m)   Wt 203 lb 9.6 oz (92.4 kg)   BMI 32.86 kg/m  Wt Readings from Last 3 Encounters:  09/08/19 203 lb 9.6 oz (92.4 kg)  09/03/19 203 lb (92.1 kg)  07/28/19 194 lb 6.4 oz (88.2 kg)    CMP     Component Value Date/Time   NA 140 04/07/2019 0859   K 4.2 04/07/2019 0859   CL 106 04/07/2019 0859   CO2 26 04/07/2019 0859   GLUCOSE 103 (H) 04/07/2019 0859   BUN 10 04/07/2019 0859   CREATININE 0.74 04/07/2019 0859   CALCIUM 9.2 04/07/2019 0859   PROT 6.5 04/07/2019 0859   ALBUMIN 3.5 01/19/2019 0329   AST 17 04/07/2019 0859   ALT 18 04/07/2019 0859   ALKPHOS 95 01/19/2019 0329   BILITOT 0.5 04/07/2019 0859   GFRNONAA 82 04/07/2019 0859   GFRAA 94 04/07/2019 0859    Diabetic Labs (most recent): Lab Results  Component Value Date   HGBA1C 5.8 (A) 09/08/2019   HGBA1C 5.9 01/15/2019   HGBA1C 5.9 01/15/2019     Lipid Panel ( most recent) Lipid Panel     Component Value Date/Time   CHOL 203 (H) 08/16/2017 1026   TRIG 64 08/16/2017 1026   HDL 57 01/15/2019 0000   CHOLHDL 3.6 08/16/2017 1026   VLDL 15 11/24/2016 1018   LDLCALC 85 01/15/2019 0000   LDLCALC 131 (H) 08/16/2017 1026     Lab Results  Component Value Date   TSH 0.34 (L) 02/27/2019   TSH 0.52 01/15/2019   TSH 0.58 08/16/2017   TSH 0.22 (L) 02/19/2017   TSH 0.28 (L) 12/15/2016   FREET4 1.5 02/27/2019       ASSESSMENT: 1. Hypothyroidism- RAI induced  PLAN:    Patient with long-standing RAI  induced hypothyroidism, on levothyroxine therapy.   -Her previsit labs are consistent with appropriate replacement.  She is advised to continue levothyroxine 112 mcg p.o. daily before breakfast.  - We discussed about the correct intake of her thyroid hormone, on empty stomach at fasting, with water, separated by at least 30 minutes from breakfast and other medications,  and separated by more than 4 hours from calcium, iron, multivitamins, acid reflux medications (PPIs). -Patient is made aware of the fact that thyroid hormone replacement is needed for life, dose to be adjusted by periodic monitoring of thyroid function tests.  -Due to absence of clinical goiter, no need for thyroid ultrasound.   Time for this visit: 15 minutes. [name]  participated in the discussions, expressed understanding, and voiced agreement with the above  plans.  All questions were answered to her satisfaction. she is encouraged to contact clinic should she have any questions or concerns prior to her return visit.   Return in about 6 months (around 03/09/2020), or labs today and before her next visit, will call her if today's labs are abnormal., for Next Visit A1c in Office.  Glade Lloyd, MD [facility] [address] [place], [place] [address] Phone: [phone]  Fax: [phone]   09/08/2019, 7:01 PM  This note was partially dictated with voice recognition software. Similar sounding words can be transcribed inadequately or may not  be corrected upon review.

## 2019-09-09 ENCOUNTER — Encounter: Payer: Self-pay | Admitting: Orthopaedic Surgery

## 2019-09-09 ENCOUNTER — Ambulatory Visit (INDEPENDENT_AMBULATORY_CARE_PROVIDER_SITE_OTHER): Payer: Medicare Other | Admitting: Orthopaedic Surgery

## 2019-09-09 VITALS — Temp 97.3°F | Ht 66.0 in | Wt 200.0 lb

## 2019-09-09 DIAGNOSIS — G8929 Other chronic pain: Secondary | ICD-10-CM | POA: Diagnosis not present

## 2019-09-09 DIAGNOSIS — M25561 Pain in right knee: Secondary | ICD-10-CM

## 2019-09-09 LAB — TSH: TSH: 0.78 mIU/L (ref 0.40–4.50)

## 2019-09-09 LAB — T4, FREE: Free T4: 1.3 ng/dL (ref 0.8–1.8)

## 2019-09-09 NOTE — Progress Notes (Signed)
My knee hurts  She has been under treatment for cancer.  She is worn out she says.  She has completed the current course of treatment.  She has right knee pain. She has tear of the lateral meniscus.  She cannot have surgery now.  I have talked to her about injection. She will do this today.  She will begin ibuprofen 600 po tid pc  Encounter Diagnosis  Name Primary?  . Chronic pain of right knee Yes   PROCEDURE NOTE:  The patient requests injections of the right knee , verbal consent was obtained.  The right knee was prepped appropriately after time out was performed.   Sterile technique was observed and injection of 1 cc of Depo-Medrol 40 mg with several cc's of plain xylocaine. Anesthesia was provided by ethyl chloride and a 20-gauge needle was used to inject the knee area. The injection was tolerated well.  A band aid dressing was applied.  The patient was advised to apply ice later today and tomorrow to the injection sight as needed.  Return in three weeks.  Begin PT in two weeks.  Call if any problem.  Precautions discussed.   Electronically Signed Sanjuana Kava, MD 4/27/202111:04 AM

## 2019-09-10 ENCOUNTER — Encounter: Payer: Self-pay | Admitting: Orthopaedic Surgery

## 2019-09-16 ENCOUNTER — Telehealth: Payer: Self-pay | Admitting: "Endocrinology

## 2019-09-16 ENCOUNTER — Other Ambulatory Visit: Payer: Self-pay

## 2019-09-16 ENCOUNTER — Encounter (HOSPITAL_COMMUNITY): Payer: Self-pay | Admitting: Physical Therapy

## 2019-09-16 ENCOUNTER — Ambulatory Visit (HOSPITAL_COMMUNITY): Payer: Medicare Other | Attending: Orthopaedic Surgery | Admitting: Physical Therapy

## 2019-09-16 DIAGNOSIS — R262 Difficulty in walking, not elsewhere classified: Secondary | ICD-10-CM | POA: Diagnosis not present

## 2019-09-16 DIAGNOSIS — M25561 Pain in right knee: Secondary | ICD-10-CM | POA: Insufficient documentation

## 2019-09-16 DIAGNOSIS — G8929 Other chronic pain: Secondary | ICD-10-CM | POA: Diagnosis not present

## 2019-09-16 DIAGNOSIS — M542 Cervicalgia: Secondary | ICD-10-CM | POA: Insufficient documentation

## 2019-09-16 NOTE — Therapy (Signed)
[facility] [place], [place], [address] Phone: [phone]   Fax:  [phone]  Physical Therapy Evaluation  Patient Details  Name: Kara [name] MRN: [id number] Date of Birth: [birth year] Referring Provider (PT): Sanjuana Kava   Encounter Date: 09/16/2019  PT End of Session - 09/16/19 1314    Visit Number  1    Number of Visits  12    Date for PT Re-Evaluation  10/28/19    Authorization Type  Medicare, BCBS as secondary, no auth. VL [id number]    Progress Note Due on Visit  10    PT Start Time  1315    PT Stop Time  1400    PT Time Calculation (min)  45 min       Past Medical History:  Diagnosis Date  . Allergy    food allergies, drug allergies  . Anemia   . Anxiety   . Arthritis   . Cataract   . Family history of breast cancer   . Family history of prostate cancer   . GERD (gastroesophageal reflux disease)   . Hyperlipidemia   . Malignant neoplasm of upper-outer quadrant of left breast in female, estrogen receptor negative ([place]) 06/11/2019   DCIS left breast  . Pre-diabetes    no meds  . Thyroid disease     Past Surgical History:  Procedure Laterality Date  . ABDOMINAL HYSTERECTOMY     bleeding. fibroids  . BREAST LUMPECTOMY WITH RADIOACTIVE SEED LOCALIZATION Left 07/08/2019   Procedure: LEFT BREAST LUMPECTOMY WITH RADIOACTIVE SEED LOCALIZATION;  Surgeon: Jovita Kussmaul, MD;  Location: [facility];  Service: General;  Laterality: Left;  . CESAREAN SECTION    . CHOLECYSTECTOMY    . COLONOSCOPY  2011   [place]: two 3-4 mm sessile polyps, hyperplastic, sigmoid diverticula, TI appeared normal.   . ESOPHAGOGASTRODUODENOSCOPY  2011   [place]: non-bleeding erosive gastropathy, normal duodenum. path: unremarkable duodenum, negative H.pylori, minimal esophagitis   . TONSILLECTOMY  1975    There were no vitals filed for this visit.   Subjective Assessment - 09/16/19 1709    Subjective  Patient  complains of right knee and lower leg pain that started about a year ago. States she used to walk lot but she has not been able to walk because of pain. States her pain wakes her up at night. She has a lot of pain and difficulty pressing down on the gas pedal, reports 10/10 pain with this motion. States that she usually wears compression stockings because she has had swelling in her legs for years but did not wear them today because she did not know if they would interfere with the evaluation. States she would like to get back to walking again as she has not been able to secondary to pain.    Pertinent History  breast cancer (last treatment this month)    Limitations  Standing;Walking    Currently in Pain?  Yes    Pain Score  6     Pain Location  Knee    Pain Orientation  Right;Anterior;Lower    Pain Descriptors / Indicators  Aching;Sharp    Pain Type  Chronic pain    Pain Radiating Towards  foot    Pain Onset  More than a month ago    Pain Frequency  Intermittent    Aggravating Factors   sleeping, pressign down on foot pedal.         [facility] PT Assessment -  09/16/19 0001      Assessment   Medical Diagnosis  right knee pain    Referring Provider (PT)  Sanjuana Kava    Next MD Visit  waiting on apt with Dr. Aline Brochure     Prior Therapy  yes for cervical spine      Balance Screen   Has the patient fallen in the past 6 months  No      [place] residence      Prior Function   Level of Independence  Independent      Cognition   Overall Cognitive Status  Within Functional Limits for tasks assessed      Observation/Other Assessments   Focus on Therapeutic Outcomes (FOTO)   63% function; 37% limited      Observation/Other Assessments-Edema    Edema  --   bilateral swelling in both lower legs (not new symptom)     ROM / Strength   AROM / PROM / Strength  AROM;Strength      AROM   AROM Assessment Site  Knee;Ankle    Right/Left Knee  Right;Left     Right Knee Extension  0    Right Knee Flexion  130    Left Knee Extension  0    Left Knee Flexion  130    Right/Left Ankle  Right;Left    Right Ankle Dorsiflexion  0    Right Ankle Plantar Flexion  60    Left Ankle Dorsiflexion  5    Left Ankle Plantar Flexion  65      Strength   Strength Assessment Site  Knee    Right/Left Knee  Right;Left      Palpation   Palpation comment  tendereness to palpation to light touch along medial aspects of knees and along right shin ; hypomobility noted in ankle and foot R       Ambulation/Gait   Gait Comments  50 feet - increased pain along front of shin                 Objective measurements completed on examination: See above findings.      [facility] Adult PT Treatment/Exercise - 09/16/19 0001      Exercises   Exercises  Knee/Hip      Knee/Hip Exercises: Stretches   Gastroc Stretch  Right;3 reps;30 seconds   with towel seated     Manual Therapy   Manual Therapy  Joint mobilization    Manual therapy comments  completed separately from other skilled interventions    Joint Mobilization  R talocrural mobilization and metatarsal mobilization grade IIII - felt good - less pain              PT Education - 09/16/19 1404    Education Details  on current condition, on HEP. Answered all questions. About continued benefits from use of compression stockings.    Person(s) Educated  Patient    Methods  Explanation    Comprehension  Verbalized understanding       PT Short Term Goals - 09/16/19 1405      PT SHORT TERM GOAL #1   Title  Patient will be independent in self management strategies to improve quality of life and functional outcomes.    Time  3    Period  Weeks    Status  New    Target Date  10/07/19      PT SHORT TERM GOAL #2   Title  Patient will report at least 25% improvement in overall symptoms and/or functional ability.    Time  3    Period  Weeks    Status  New    Target Date  10/07/19      PT SHORT  TERM GOAL #3   Title  Patient will report not waking up in the middle of the night secondary to pain to improve overall sleep quality.    Time  3    Period  Weeks    Status  New    Target Date  10/07/19        PT Long Term Goals - 09/16/19 1405      PT LONG TERM GOAL #1   Title  Patient will be able to drive her car without severe pain to demonstrate improved functional mobility or right lower extremity.    Time  6    Period  Weeks    Status  New    Target Date  10/28/19      PT LONG TERM GOAL #2   Title  Patient will score with < 30% limitation on FOTO to demonstrate improved functional mobility and tolerance.    Time  6    Period  Weeks    Status  New    Target Date  10/28/19      PT LONG TERM GOAL #3   Title  Patient will report at least 50% improvement in overall symptoms and/or functional ability.    Time  6    Period  Weeks    Status  New    Target Date  10/28/19             Plan - 09/16/19 1603    Clinical Impression Statement  Patient presents with signs and symptoms consistent with peripheral nerve entrapment along femoral nerve. Patient tolerated joint mobilizations well reporting no pain afterwards. Patient would greatly benefit from skilled physical therapy to improve joint mobility and decrease neural restrictions to improve functional mobility and tolerance to daily activities.    Personal Factors and Comorbidities  Comorbidity 2;Comorbidity 1    Comorbidities  chronic swelling in bilateral lower extremities, breast cancer    Examination-Activity Limitations  Stand;Locomotion Level;Stairs;Sleep    Examination-Participation Restrictions  Cleaning;Community Activity;Driving;Meal Prep    Stability/Clinical Decision Making  Evolving/Moderate complexity    Clinical Decision Making  Moderate    Rehab Potential  Good    PT Frequency  2x / week    PT Duration  6 weeks    PT Treatment/Interventions  ADLs/Self Care Home Management;Aquatic  Therapy;Biofeedback;Cryotherapy;Electrical Stimulation;Moist Heat;Traction;Balance training;Therapeutic exercise;Therapeutic activities;Functional mobility training;Stair training;Gait training;DME Instruction;Neuromuscular re-education;Cognitive remediation;Patient/family education;Manual techniques;Dry needling;Passive range of motion;Joint Manipulations    PT Next Visit Plan  tib fib mobilizations, STM, ankle mobility and strengthening    PT Home Exercise Plan  5/4 calf stretch    Consulted and Agree with Plan of Care  Patient       Patient will benefit from skilled therapeutic intervention in order to improve the following deficits and impairments:  Pain, Difficulty walking, Decreased strength, Decreased activity tolerance, Decreased range of motion, Increased edema, Decreased endurance  Visit Diagnosis: Chronic pain of right knee  Difficulty in walking, not elsewhere classified     Problem List Patient Active Problem List   Diagnosis Date Noted  . Chronic pain of left knee 09/03/2019  . Genetic testing 08/11/2019  . Family history of breast cancer   . Family history of prostate cancer   .  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  . Weight loss, unintentional 03/27/2019  . Hip pain, acute, left 03/27/2019  . 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 Urination pain 01/16/2019  . Vaginal discharge 01/16/2019  . Anxiety 01/12/2019  . Sinusitis 01/06/2019  . Headache in back of head 12/31/2018  . Splinter in skin 12/23/2018  . Insomnia due to stress 12/23/2018  . H/O swallowed foreign body 11/20/2018  . Change in bowel function 04/09/2018  . Breast pain, left 02/25/2018  . Prediabetes 08/30/2017  . Dyspepsia 09/29/2016  . Colon polyps 08/23/2016  . Family history of cancer 08/23/2016  . HLD (hyperlipidemia) 08/23/2016  . Obesity, Class II, BMI 35-39.9, with comorbidity 08/23/2016  . GERD (gastroesophageal reflux disease) 08/14/2016  .  Hypothyroid 08/14/2016  . Perimenopausal vasomotor symptoms 02/11/2016  . Postmenopausal atrophic vaginitis 11/09/2015    5:16 PM, 09/16/19 Jerene Pitch, DPT Physical Therapy with [facility]  [phone] office   [facility] [address] [place], [place], [address] Phone: [phone]   Fax:  [phone]  Name: Ceonna Meckel MRN: [id number] Date of Birth: 07-Dec-1945

## 2019-09-16 NOTE — Telephone Encounter (Signed)
Pt is requesting her lab results.

## 2019-09-16 NOTE — Patient Instructions (Signed)
Access Code: [email or web address] URL: [email or web address] Date: 09/16/2019 Prepared by: Yetta Glassman  Exercises Seated Calf Stretch with Strap - 1 sets - 3 reps - 30 hold

## 2019-09-16 NOTE — Telephone Encounter (Signed)
Lft msg for pt to call back 

## 2019-09-18 ENCOUNTER — Encounter (HOSPITAL_COMMUNITY): Payer: Self-pay | Admitting: Physical Therapy

## 2019-09-18 ENCOUNTER — Ambulatory Visit (HOSPITAL_COMMUNITY): Payer: Medicare Other | Admitting: Physical Therapy

## 2019-09-18 ENCOUNTER — Other Ambulatory Visit: Payer: Self-pay

## 2019-09-18 DIAGNOSIS — G8929 Other chronic pain: Secondary | ICD-10-CM

## 2019-09-18 DIAGNOSIS — M25561 Pain in right knee: Secondary | ICD-10-CM | POA: Diagnosis not present

## 2019-09-18 DIAGNOSIS — R262 Difficulty in walking, not elsewhere classified: Secondary | ICD-10-CM

## 2019-09-18 DIAGNOSIS — M542 Cervicalgia: Secondary | ICD-10-CM | POA: Diagnosis not present

## 2019-09-18 NOTE — Telephone Encounter (Signed)
Discussed with pt, understanding voiced. 

## 2019-09-18 NOTE — Therapy (Addendum)
[facility] [address] [place], [place], [address] Phone: [phone]   Fax:  [phone]  Physical Therapy Treatment  Patient Details  Name: Kara [name] MRN: [id number] Date of Birth: January 21, 1946 Referring Provider (PT): Sanjuana Kava   Encounter Date: 09/18/2019  PT End of Session - 09/18/19 0837    Visit Number  2    Number of Visits  12    Date for PT Re-Evaluation  10/28/19    Authorization Type  Medicare, BCBS as secondary, no auth. VL 75    Progress Note Due on Visit  10    PT Start Time  0830    PT Stop Time  0910    PT Time Calculation (min)  40 min       Past Medical History:  Diagnosis Date  . Allergy    food allergies, drug allergies  . Anemia   . Anxiety   . Arthritis   . Cataract   . Family history of breast cancer   . Family history of prostate cancer   . GERD (gastroesophageal reflux disease)   . Hyperlipidemia   . Malignant neoplasm of upper-outer quadrant of left breast in female, estrogen receptor negative ([place]) 06/11/2019   DCIS left breast  . Pre-diabetes    no meds  . Thyroid disease     Past Surgical History:  Procedure Laterality Date  . ABDOMINAL HYSTERECTOMY     bleeding. fibroids  . BREAST LUMPECTOMY WITH RADIOACTIVE SEED LOCALIZATION Left 07/08/2019   Procedure: LEFT BREAST LUMPECTOMY WITH RADIOACTIVE SEED LOCALIZATION;  Surgeon: Jovita Kussmaul, MD;  Location: [facility];  Service: General;  Laterality: Left;  . CESAREAN SECTION    . CHOLECYSTECTOMY    . COLONOSCOPY  2011   [place]: two 3-4 mm sessile polyps, hyperplastic, sigmoid diverticula, TI appeared normal.   . ESOPHAGOGASTRODUODENOSCOPY  2011   [place]: non-bleeding erosive gastropathy, normal duodenum. path: unremarkable duodenum, negative H.pylori, minimal esophagitis   . TONSILLECTOMY  1975    There were no vitals filed for this visit.  Subjective Assessment - 09/18/19 0836    Subjective  States relief  lasted a couple hours after last session.  States she has been doing her exercise and she finds good relief with the exercise. States about 5/10 pain currently and has been wearing support hoes.    Pertinent History  breast cancer (last treatment this month)    Limitations  Standing;Walking    Currently in Pain?  Yes    Pain Score  5     Pain Location  Knee    Pain Orientation  Right    Pain Onset  More than a month ago         [facility] PT Assessment - 09/18/19 0001      Assessment   Medical Diagnosis  right knee pain    Referring Provider (PT)  Sanjuana Kava                   [facility] Adult PT Treatment/Exercise - 09/18/19 0001      Knee/Hip Exercises: Stretches   Gastroc Stretch  Right;3 reps;30 seconds      Knee/Hip Exercises: Seated   Other Seated Knee/Hip Exercises  DF with knee bent and strap assist x15, 5" R ; self mobilization with rolling stick - 8 minutes R LE    Other Seated Knee/Hip Exercises  self PROM to right ankle PF and DF 10 second hold and 2x8 R; PT  PROM to right ankle and toes - 10 minutes      Manual Therapy   Manual Therapy  Joint mobilization;Soft tissue mobilization    Manual therapy comments  completed separately from other skilled interventions    Joint Mobilization  R talocrural mobilization and metatarsal mobilization grade IIII - felt good - less pain     Soft tissue mobilization  Soft tissue mobilization to R LE - tolerated well                PT Short Term Goals - 09/16/19 1405      PT SHORT TERM GOAL #1   Title  Patient will be independent in self management strategies to improve quality of life and functional outcomes.    Time  3    Period  Weeks    Status  New    Target Date  10/07/19      PT SHORT TERM GOAL #2   Title  Patient will report at least 25% improvement in overall symptoms and/or functional ability.    Time  3    Period  Weeks    Status  New    Target Date  10/07/19      PT SHORT TERM GOAL #3   Title   Patient will report not waking up in the middle of the night secondary to pain to improve overall sleep quality.    Time  3    Period  Weeks    Status  New    Target Date  10/07/19        PT Long Term Goals - 09/16/19 1405      PT LONG TERM GOAL #1   Title  Patient will be able to drive her car without severe pain to demonstrate improved functional mobility or right lower extremity.    Time  6    Period  Weeks    Status  New    Target Date  10/28/19      PT LONG TERM GOAL #2   Title  Patient will score with < 30% limitation on FOTO to demonstrate improved functional mobility and tolerance.    Time  6    Period  Weeks    Status  New    Target Date  10/28/19      PT LONG TERM GOAL #3   Title  Patient will report at least 50% improvement in overall symptoms and/or functional ability.    Time  6    Period  Weeks    Status  New    Target Date  10/28/19            Plan - 09/18/19 0837    Clinical Impression Statement  Tolerated session well. Decreased pain with exercises and mobilizations. Added self PROM to foot and patient tolerated very well. Instructed patient ot perform at night when in pain. Will measure patient for compression stockings next session per patient request and continued swelling in lower extremities.    Personal Factors and Comorbidities  Comorbidity 2;Comorbidity 1    Comorbidities  chronic swelling in bilateral lower extremities, breast cancer    Examination-Activity Limitations  Stand;Locomotion Level;Stairs;Sleep    Examination-Participation Restrictions  Cleaning;Community Activity;Driving;Meal Prep    Stability/Clinical Decision Making  Evolving/Moderate complexity    Rehab Potential  Good    PT Frequency  2x / week    PT Duration  6 weeks    PT Treatment/Interventions  ADLs/Self Care Home Management;Aquatic Therapy;Biofeedback;Cryotherapy;Electrical Stimulation;Moist Heat;Traction;Balance training;Therapeutic exercise;Therapeutic  activities;Functional mobility training;Stair training;Gait training;DME Instruction;Neuromuscular re-education;Cognitive remediation;Patient/family education;Manual techniques;Dry needling;Passive range of motion;Joint Manipulations    PT Next Visit Plan  measure for compression stockings. tib fib mobilizations, STM, ankle mobility and strengthening    PT Home Exercise Plan  5/4 calf stretch    Consulted and Agree with Plan of Care  Patient       Patient will benefit from skilled therapeutic intervention in order to improve the following deficits and impairments:  Pain, Difficulty walking, Decreased strength, Decreased activity tolerance, Decreased range of motion, Increased edema, Decreased endurance  Visit Diagnosis: Chronic pain of right knee  Difficulty in walking, not elsewhere classified     Problem List Patient Active Problem List   Diagnosis Date Noted  . Chronic pain of left knee 09/03/2019  . Genetic testing 08/11/2019  . Family history of breast cancer   . Family history of prostate cancer   . 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  . Weight loss, unintentional 03/27/2019  . Hip pain, acute, left 03/27/2019  . 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 Urination pain 01/16/2019  . Vaginal discharge 01/16/2019  . Anxiety 01/12/2019  . Sinusitis 01/06/2019  . Headache in back of head 12/31/2018  . Splinter in skin 12/23/2018  . Insomnia due to stress 12/23/2018  . H/O swallowed foreign body 11/20/2018  . Change in bowel function 04/09/2018  . Breast pain, left 02/25/2018  . Prediabetes 08/30/2017  . Dyspepsia 09/29/2016  . Colon polyps 08/23/2016  . Family history of cancer 08/23/2016  . HLD (hyperlipidemia) 08/23/2016  . Obesity, Class II, BMI 35-39.9, with comorbidity 08/23/2016  . GERD (gastroesophageal reflux disease) 08/14/2016  . Hypothyroid 08/14/2016  . Perimenopausal vasomotor symptoms 02/11/2016  . Postmenopausal  atrophic vaginitis 11/09/2015   10:55 AM, 09/18/19 Jerene Pitch, DPT Physical Therapy with [facility]  [phone] office  [facility] [address] [place], [place], [address] Phone: [phone]   Fax:  [phone]  Name: Kara [name] MRN: [id number] Date of Birth: [birth year]

## 2019-09-18 NOTE — Telephone Encounter (Signed)
Left a message requesting a return call to the office. 

## 2019-09-23 ENCOUNTER — Other Ambulatory Visit: Payer: Self-pay

## 2019-09-23 ENCOUNTER — Ambulatory Visit (HOSPITAL_COMMUNITY): Payer: Medicare Other | Admitting: Physical Therapy

## 2019-09-23 DIAGNOSIS — G8929 Other chronic pain: Secondary | ICD-10-CM

## 2019-09-23 DIAGNOSIS — R262 Difficulty in walking, not elsewhere classified: Secondary | ICD-10-CM

## 2019-09-23 DIAGNOSIS — M25561 Pain in right knee: Secondary | ICD-10-CM | POA: Diagnosis not present

## 2019-09-23 DIAGNOSIS — M542 Cervicalgia: Secondary | ICD-10-CM | POA: Diagnosis not present

## 2019-09-23 NOTE — Therapy (Signed)
[facility] [address] [place], [place], [address] Phone: [phone]   Fax:  [phone]  Physical Therapy Treatment  Patient Details  Name: Kara [name] MRN: [id number] Date of Birth: 1946-04-08 Referring Provider (PT): Sanjuana Kava   Encounter Date: 09/23/2019  PT End of Session - 09/23/19 [id number]    Visit Number  3    Number of Visits  12    Date for PT Re-Evaluation  10/28/19    Authorization Type  Medicare, BCBS as secondary, no auth. [id number]    Progress Note Due on Visit  10    PT Start Time  1400    PT Stop Time  1440    PT Time Calculation (min)  40 min    Behavior During Therapy  WFL for tasks assessed/performed       Past Medical History:  Diagnosis Date  . Allergy    food allergies, drug allergies  . Anemia   . Anxiety   . Arthritis   . Cataract   . Family history of breast cancer   . Family history of prostate cancer   . GERD (gastroesophageal reflux disease)   . Hyperlipidemia   . Malignant neoplasm of upper-outer quadrant of left breast in female, estrogen receptor negative ([facility]) 06/11/2019   DCIS left breast  . Pre-diabetes    no meds  . Thyroid disease     Past Surgical History:  Procedure Laterality Date  . ABDOMINAL HYSTERECTOMY     bleeding. fibroids  . BREAST LUMPECTOMY WITH RADIOACTIVE SEED LOCALIZATION Left 07/08/2019   Procedure: LEFT BREAST LUMPECTOMY WITH RADIOACTIVE SEED LOCALIZATION;  Surgeon: Jovita Kussmaul, MD;  Location: [facility];  Service: General;  Laterality: Left;  . CESAREAN SECTION    . CHOLECYSTECTOMY    . COLONOSCOPY  2011   [place]: two 3-4 mm sessile polyps, hyperplastic, sigmoid diverticula, TI appeared normal.   . ESOPHAGOGASTRODUODENOSCOPY  2011   [place]: non-bleeding erosive gastropathy, normal duodenum. path: unremarkable duodenum, negative H.pylori, minimal esophagitis   . TONSILLECTOMY  1975    There were no vitals filed for this visit.  Subjective  Assessment - 09/23/19 1408    Subjective  After last session her pain stayed away for most the day and they came back later that afternoon. States her exercises decreases her pain but it does come back. Current pain is 4/10 in the knee and  it feels like it is in her hip today.    Pertinent History  breast cancer (last treatment this month)    Limitations  Standing;Walking    Currently in Pain?  Yes    Pain Score  4     Pain Location  Knee    Pain Orientation  Right    Pain Descriptors / Indicators  Aching;Sore    Pain Onset  More than a month ago         [facility] PT Assessment - 09/23/19 0001      Assessment   Medical Diagnosis  right knee pain    Referring Provider (PT)  Wayne Keeling      AROM   Overall AROM Comments  repeated lumbar fleixon - decreased symptoms down leg, repeated lumbar extension decreased pain in leg     AROM Assessment Site  Lumbar    Lumbar Flexion  0% limited   stretch down backs of legs   Lumbar Extension  50% limited    stretch  [facility] PT Treatment/Exercise - 09/23/19 0001      Knee/Hip Exercises: Seated   Other Seated Knee/Hip Exercises  reopeated lumbar flexion       Knee/Hip Exercises: Supine   Other Supine Knee/Hip Exercises  sciatic nerve glide x15 R;thomas stretch x4 30" R, self patella mobilizations R inf x15 5" holds    Other Supine Knee/Hip Exercises  SKC with towel 2x15 B      Manual Therapy   Manual Therapy  Joint mobilization;Soft tissue mobilization    Manual therapy comments  completed separately from other skilled interventions    Joint Mobilization  R patella mobilizations grade II/III tolerated well.     Soft tissue mobilization  STM to right distal quad               PT Short Term Goals - 09/16/19 1405      PT SHORT TERM GOAL #1   Title  Patient will be independent in self management strategies to improve quality of life and functional outcomes.    Time  3    Period  Weeks    Status   New    Target Date  10/07/19      PT SHORT TERM GOAL #2   Title  Patient will report at least 25% improvement in overall symptoms and/or functional ability.    Time  3    Period  Weeks    Status  New    Target Date  10/07/19      PT SHORT TERM GOAL #3   Title  Patient will report not waking up in the middle of the night secondary to pain to improve overall sleep quality.    Time  3    Period  Weeks    Status  New    Target Date  10/07/19        PT Long Term Goals - 09/16/19 1405      PT LONG TERM GOAL #1   Title  Patient will be able to drive her car without severe pain to demonstrate improved functional mobility or right lower extremity.    Time  6    Period  Weeks    Status  New    Target Date  10/28/19      PT LONG TERM GOAL #2   Title  Patient will score with < 30% limitation on FOTO to demonstrate improved functional mobility and tolerance.    Time  6    Period  Weeks    Status  New    Target Date  10/28/19      PT LONG TERM GOAL #3   Title  Patient will report at least 50% improvement in overall symptoms and/or functional ability.    Time  6    Period  Weeks    Status  New    Target Date  10/28/19            Plan - 09/23/19 1408    Clinical Impression Statement  Patient reported less pain after manual work and lumbar/hip mobility work. Improved mobility noted end of session. Added new exercises to HEP. Will follow up with symptoms with addition of new exercises.    Personal Factors and Comorbidities  Comorbidity 2;Comorbidity 1    Comorbidities  chronic swelling in bilateral lower extremities, breast cancer    Examination-Activity Limitations  Stand;Locomotion Level;Stairs;Sleep    Examination-Participation Restrictions  Cleaning;Community Activity;Driving;Meal Prep    Stability/Clinical Decision Making  Evolving/Moderate complexity  Rehab Potential  Good    PT Frequency  2x / week    PT Duration  6 weeks    PT Treatment/Interventions  ADLs/Self  Care Home Management;Aquatic Therapy;Biofeedback;Cryotherapy;Electrical Stimulation;Moist Heat;Traction;Balance training;Therapeutic exercise;Therapeutic activities;Functional mobility training;Stair training;Gait training;DME Instruction;Neuromuscular re-education;Cognitive remediation;Patient/family education;Manual techniques;Dry needling;Passive range of motion;Joint Manipulations    PT Next Visit Plan  continue to f/u measure for compression stockings. tib fib mobilizations, STM, ankle mobility and strengthening    PT Home Exercise Plan  5/4 calf stretch; 5/11 patella mobilization, thomas stretch, SKC, sciatic nerve glide    Consulted and Agree with Plan of Care  Patient       Patient will benefit from skilled therapeutic intervention in order to improve the following deficits and impairments:  Pain, Difficulty walking, Decreased strength, Decreased activity tolerance, Decreased range of motion, Increased edema, Decreased endurance  Visit Diagnosis: Chronic pain of right knee  Difficulty in walking, not elsewhere classified     Problem List Patient Active Problem List   Diagnosis Date Noted  . Chronic pain of left knee 09/03/2019  . Genetic testing 08/11/2019  . Family history of breast cancer   . Family history of prostate cancer   . 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  . Weight loss, unintentional 03/27/2019  . Hip pain, acute, left 03/27/2019  . 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 Urination pain 01/16/2019  . Vaginal discharge 01/16/2019  . Anxiety 01/12/2019  . Sinusitis 01/06/2019  . Headache in back of head 12/31/2018  . Splinter in skin 12/23/2018  . Insomnia due to stress 12/23/2018  . H/O swallowed foreign body 11/20/2018  . Change in bowel function 04/09/2018  . Breast pain, left 02/25/2018  . Prediabetes 08/30/2017  . Dyspepsia 09/29/2016  . Colon polyps 08/23/2016  . Family history of cancer 08/23/2016  . HLD  (hyperlipidemia) 08/23/2016  . Obesity, Class II, BMI 35-39.9, with comorbidity 08/23/2016  . GERD (gastroesophageal reflux disease) 08/14/2016  . Hypothyroid 08/14/2016  . Perimenopausal vasomotor symptoms 02/11/2016  . Postmenopausal atrophic vaginitis 11/09/2015    4:13 PM, 09/23/19 Jerene Pitch, DPT Physical Therapy with [facility]  [phone] office  [facility] [address] [place], [place], [address] Phone: [phone]   Fax:  [phone]  Name: Kara [name] MRN: [id number] Date of Birth: [birth year]

## 2019-09-25 ENCOUNTER — Other Ambulatory Visit: Payer: Self-pay

## 2019-09-25 ENCOUNTER — Encounter (HOSPITAL_COMMUNITY): Payer: Self-pay | Admitting: Physical Therapy

## 2019-09-25 ENCOUNTER — Ambulatory Visit (HOSPITAL_COMMUNITY): Payer: Medicare Other | Admitting: Physical Therapy

## 2019-09-25 DIAGNOSIS — M25561 Pain in right knee: Secondary | ICD-10-CM | POA: Diagnosis not present

## 2019-09-25 DIAGNOSIS — R262 Difficulty in walking, not elsewhere classified: Secondary | ICD-10-CM

## 2019-09-25 DIAGNOSIS — G8929 Other chronic pain: Secondary | ICD-10-CM | POA: Diagnosis not present

## 2019-09-25 DIAGNOSIS — M542 Cervicalgia: Secondary | ICD-10-CM | POA: Diagnosis not present

## 2019-09-25 NOTE — Therapy (Signed)
[facility] [address] [place], [place], [address] Phone: [phone]   Fax:  [phone]  Physical Therapy Treatment  Patient Details  Name: Kara [name] MRN: [id number] Date of Birth: [birth year] Referring Provider (PT): Sanjuana Kava   Encounter Date: 09/25/2019  PT End of Session - 09/25/19 0959    Visit Number  4    Number of Visits  12    Date for PT Re-Evaluation  10/28/19    Authorization Type  Medicare, BCBS as secondary, no auth. VL [id number]    Progress Note Due on Visit  10    PT Start Time  1000    PT Stop Time  1040    PT Time Calculation (min)  40 min    Behavior During Therapy  WFL for tasks assessed/performed       Past Medical History:  Diagnosis Date  . Allergy    food allergies, drug allergies  . Anemia   . Anxiety   . Arthritis   . Cataract   . Family history of breast cancer   . Family history of prostate cancer   . GERD (gastroesophageal reflux disease)   . Hyperlipidemia   . Malignant neoplasm of upper-outer quadrant of left breast in female, estrogen receptor negative ([place]) 06/11/2019   DCIS left breast  . Pre-diabetes    no meds  . Thyroid disease     Past Surgical History:  Procedure Laterality Date  . ABDOMINAL HYSTERECTOMY     bleeding. fibroids  . BREAST LUMPECTOMY WITH RADIOACTIVE SEED LOCALIZATION Left 07/08/2019   Procedure: LEFT BREAST LUMPECTOMY WITH RADIOACTIVE SEED LOCALIZATION;  Surgeon: Jovita Kussmaul, MD;  Location: [facility];  Service: General;  Laterality: Left;  . CESAREAN SECTION    . CHOLECYSTECTOMY    . COLONOSCOPY  2011   [place]: two 3-4 mm sessile polyps, hyperplastic, sigmoid diverticula, TI appeared normal.   . ESOPHAGOGASTRODUODENOSCOPY  2011   [place]: non-bleeding erosive gastropathy, normal duodenum. path: unremarkable duodenum, negative H.pylori, minimal esophagitis   . TONSILLECTOMY  1975    There were no vitals filed for this visit.  Subjective  Assessment - 09/25/19 1000    Subjective  States she is feeling stiffness and is having some pain in the back of her knee. States that she did a lot of her lumbar flexion exercises and doesn't know if she is doing it correctly. States she has been doing her exercise with her leg off the edge of the bed (Thomas stretch) and that feels really good.    Pertinent History  breast cancer (last treatment this month)    Limitations  Standing;Walking    Currently in Pain?  Yes    Pain Score  4     Pain Location  Knee    Pain Orientation  Right;Posterior    Pain Descriptors / Indicators  Aching    Pain Type  Chronic pain    Pain Onset  More than a month ago         [facility] PT Assessment - 09/25/19 0001      Assessment   Medical Diagnosis  right knee pain    Referring Provider (PT)  [facility] Adult PT Treatment/Exercise - 09/25/19 0001      Knee/Hip Exercises: Prone   Other Prone Exercises  prone lumbar extensions x5, then in seated position  Other Prone Exercises  prone knee flexion R - patient unable to relax with movement      Manual Therapy   Manual Therapy  Joint mobilization;Soft tissue mobilization    Manual therapy comments  completed separately from other skilled interventions    Joint Mobilization  gentle PA to lumbar spine grade I/II - L3 reproduced leg symptoms. gentle PA to r hip pt prone grade II - tolerated moderately well    Soft tissue mobilization  Gently STM to right glute med/piriformis and psoas/iliacus - R - tolerated mildly well - increased pain and tension with interventions.             PT Education - 09/25/19 1101    Education Details  educated patient in anatomy of lumbar/hip/pelvis and how muscles can cause radicular pain. Educated patient in mobilizations and how they can help - patient worried they could increase pain or damage, reassured patient and held off on further mobilizations on this date.    Person(s)  Educated  Patient    Methods  Explanation;Demonstration;Tactile cues    Comprehension  Verbalized understanding;Need further instruction       PT Short Term Goals - 09/16/19 1405      PT SHORT TERM GOAL #1   Title  Patient will be independent in self management strategies to improve quality of life and functional outcomes.    Time  3    Period  Weeks    Status  New    Target Date  10/07/19      PT SHORT TERM GOAL #2   Title  Patient will report at least 25% improvement in overall symptoms and/or functional ability.    Time  3    Period  Weeks    Status  New    Target Date  10/07/19      PT SHORT TERM GOAL #3   Title  Patient will report not waking up in the middle of the night secondary to pain to improve overall sleep quality.    Time  3    Period  Weeks    Status  New    Target Date  10/07/19        PT Long Term Goals - 09/16/19 1405      PT LONG TERM GOAL #1   Title  Patient will be able to drive her car without severe pain to demonstrate improved functional mobility or right lower extremity.    Time  6    Period  Weeks    Status  New    Target Date  10/28/19      PT LONG TERM GOAL #2   Title  Patient will score with < 30% limitation on FOTO to demonstrate improved functional mobility and tolerance.    Time  6    Period  Weeks    Status  New    Target Date  10/28/19      PT LONG TERM GOAL #3   Title  Patient will report at least 50% improvement in overall symptoms and/or functional ability.    Time  6    Period  Weeks    Status  New    Target Date  10/28/19            Plan - 09/25/19 0959    Clinical Impression Statement  Session focused on further assessment of knee pain. Reproduction of symptoms noted with PA of L3 SP. This was not tolerated by patient as she was concerned  it was causing more damage then help. Educated patient in intervention, rationale, and how it helps with current symptoms. Educated patient on anatomy and answered all questions.  Able to lay prone today with pillows under abdomen but discomfort noted by end of session. Will continue with psoas/lumbar mobility work but try exercise based interventions not in prone position next session secondary to patient comfort.    Personal Factors and Comorbidities  Comorbidity 2;Comorbidity 1    Comorbidities  chronic swelling in bilateral lower extremities, breast cancer    Examination-Activity Limitations  Stand;Locomotion Level;Stairs;Sleep    Examination-Participation Restrictions  Cleaning;Community Activity;Driving;Meal Prep    Stability/Clinical Decision Making  Evolving/Moderate complexity    Rehab Potential  Good    PT Frequency  2x / week    PT Duration  6 weeks    PT Treatment/Interventions  ADLs/Self Care Home Management;Aquatic Therapy;Biofeedback;Cryotherapy;Electrical Stimulation;Moist Heat;Traction;Balance training;Therapeutic exercise;Therapeutic activities;Functional mobility training;Stair training;Gait training;DME Instruction;Neuromuscular re-education;Cognitive remediation;Patient/family education;Manual techniques;Dry needling;Passive range of motion;Joint Manipulations    PT Next Visit Plan  f/u with psoas/ PA pf hip and lumbar spine as tolerated - alt positions and exercises for patient comfort (towelroll; across lumbar spine supine. ). tib fib mobilizations, STM, ankle mobility and strengthening    PT Home Exercise Plan  5/4 calf stretch; 5/11 patella mobilization, thomas stretch, SKC, sciatic nerve glide    Consulted and Agree with Plan of Care  Patient       Patient will benefit from skilled therapeutic intervention in order to improve the following deficits and impairments:  Pain, Difficulty walking, Decreased strength, Decreased activity tolerance, Decreased range of motion, Increased edema, Decreased endurance  Visit Diagnosis: Chronic pain of right knee  Difficulty in walking, not elsewhere classified     Problem List Patient Active Problem List    Diagnosis Date Noted  . Chronic pain of left knee 09/03/2019  . Genetic testing 08/11/2019  . Family history of breast cancer   . Family history of prostate cancer   . 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  . Weight loss, unintentional 03/27/2019  . Hip pain, acute, left 03/27/2019  . 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 Urination pain 01/16/2019  . Vaginal discharge 01/16/2019  . Anxiety 01/12/2019  . Sinusitis 01/06/2019  . Headache in back of head 12/31/2018  . Splinter in skin 12/23/2018  . Insomnia due to stress 12/23/2018  . H/O swallowed foreign body 11/20/2018  . Change in bowel function 04/09/2018  . Breast pain, left 02/25/2018  . Prediabetes 08/30/2017  . Dyspepsia 09/29/2016  . Colon polyps 08/23/2016  . Family history of cancer 08/23/2016  . HLD (hyperlipidemia) 08/23/2016  . Obesity, Class II, BMI 35-39.9, with comorbidity 08/23/2016  . GERD (gastroesophageal reflux disease) 08/14/2016  . Hypothyroid 08/14/2016  . Perimenopausal vasomotor symptoms 02/11/2016  . Postmenopausal atrophic vaginitis 11/09/2015   11:05 AM, 09/25/19 Jerene Pitch, DPT Physical Therapy with [facility]  [phone] office  [facility] [address] [place], [place], [address] Phone: [phone]   Fax:  [phone]  Name: Kara [name] MRN: [id number] Date of Birth: Sep 13, 1945

## 2019-09-30 ENCOUNTER — Ambulatory Visit: Payer: Medicare Other | Admitting: Orthopaedic Surgery

## 2019-09-30 ENCOUNTER — Telehealth: Payer: Self-pay | Admitting: *Deleted

## 2019-09-30 ENCOUNTER — Encounter: Payer: Self-pay | Admitting: Radiation Oncology

## 2019-09-30 NOTE — Telephone Encounter (Signed)
CALLED PATIENT TO ALTER FU ON 10-02-19, DUE TO COVID- 19, FU APPT. RESCHEDULED FOR 10-23-19 @ 9:45 AM, LVM FOR A RETURN CALL

## 2019-10-01 NOTE — Progress Notes (Incomplete)
  Patient Name: Kara [name] MRN: [id number] DOB: [birth year] Referring Physician: Benny Lennert Date of Service: 09/01/2019 [facility], [place]                                                        End Of Treatment Note  Diagnoses: D05.12-Intraductal carcinoma in situ of left breast  Cancer Staging: Stage 0 (cN0, M0) Left Breast UOQ, DCIS, ER- / PR-, Grade 3  Intent: Curative  Radiation Treatment Dates: 08/11/2019 through 09/01/2019 Site Technique Total Dose (Gy) Dose per Fx (Gy) Completed Fx Beam Energies  Breast, Left: Breast_Lt 3D 42.72/42.72 2.67 16/16 6X   Narrative: The patient tolerated radiation therapy relatively well. She did report some moderate fatigue and some shooting pains throughout the left breast that were relieved with APAP. She also reported some dyspnea with exertion that improved. During the second week of treatment, the left breast was noted to be slightly swollen with some slight hyperpigmentation/erythema. This was present throughout the remainder of treatment but there was no significant reaction.  Plan: The patient will follow-up with radiation oncology in one month.  ________________________________________________   Blair Promise, PhD, MD  This document serves as a record of services personally performed by Gery Pray, MD. It was created on his behalf by Clerance Lav, a trained medical scribe. The creation of this record is based on the scribe's personal observations and the provider's statements to them. This document has been checked and approved by the attending provider.

## 2019-10-02 ENCOUNTER — Ambulatory Visit: Payer: Self-pay | Admitting: Radiation Oncology

## 2019-10-07 ENCOUNTER — Ambulatory Visit (HOSPITAL_COMMUNITY): Payer: Medicare Other | Admitting: Physical Therapy

## 2019-10-09 ENCOUNTER — Encounter (HOSPITAL_COMMUNITY): Payer: Self-pay | Admitting: Physical Therapy

## 2019-10-14 ENCOUNTER — Ambulatory Visit (INDEPENDENT_AMBULATORY_CARE_PROVIDER_SITE_OTHER): Payer: Medicare Other | Admitting: Internal Medicine

## 2019-10-21 ENCOUNTER — Encounter: Payer: Self-pay | Admitting: Nurse Practitioner

## 2019-10-21 ENCOUNTER — Encounter (HOSPITAL_COMMUNITY): Payer: Self-pay | Admitting: Physical Therapy

## 2019-10-21 ENCOUNTER — Ambulatory Visit (INDEPENDENT_AMBULATORY_CARE_PROVIDER_SITE_OTHER): Payer: Medicare Other | Admitting: Nurse Practitioner

## 2019-10-21 ENCOUNTER — Telehealth (INDEPENDENT_AMBULATORY_CARE_PROVIDER_SITE_OTHER): Payer: Self-pay

## 2019-10-21 ENCOUNTER — Ambulatory Visit (HOSPITAL_COMMUNITY): Payer: Medicare Other | Attending: Orthopaedic Surgery | Admitting: Physical Therapy

## 2019-10-21 ENCOUNTER — Other Ambulatory Visit: Payer: Self-pay

## 2019-10-21 ENCOUNTER — Encounter (INDEPENDENT_AMBULATORY_CARE_PROVIDER_SITE_OTHER): Payer: Self-pay | Admitting: Nurse Practitioner

## 2019-10-21 VITALS — BP 92/64 | HR 68 | Temp 98.6°F | Ht 66.0 in | Wt 210.4 lb

## 2019-10-21 DIAGNOSIS — E559 Vitamin D deficiency, unspecified: Secondary | ICD-10-CM | POA: Diagnosis not present

## 2019-10-21 DIAGNOSIS — G8929 Other chronic pain: Secondary | ICD-10-CM | POA: Insufficient documentation

## 2019-10-21 DIAGNOSIS — E785 Hyperlipidemia, unspecified: Secondary | ICD-10-CM | POA: Diagnosis not present

## 2019-10-21 DIAGNOSIS — R5383 Other fatigue: Secondary | ICD-10-CM | POA: Diagnosis not present

## 2019-10-21 DIAGNOSIS — R262 Difficulty in walking, not elsewhere classified: Secondary | ICD-10-CM | POA: Insufficient documentation

## 2019-10-21 DIAGNOSIS — R7303 Prediabetes: Secondary | ICD-10-CM | POA: Diagnosis not present

## 2019-10-21 DIAGNOSIS — E890 Postprocedural hypothyroidism: Secondary | ICD-10-CM

## 2019-10-21 DIAGNOSIS — I959 Hypotension, unspecified: Secondary | ICD-10-CM

## 2019-10-21 DIAGNOSIS — Z8639 Personal history of other endocrine, nutritional and metabolic disease: Secondary | ICD-10-CM | POA: Diagnosis not present

## 2019-10-21 DIAGNOSIS — M25561 Pain in right knee: Secondary | ICD-10-CM | POA: Insufficient documentation

## 2019-10-21 MED ORDER — BD ASSURE BPM/AUTO ARM CUFF MISC: 0 refills | Status: DC

## 2019-10-21 NOTE — Progress Notes (Signed)
Subjective:  Patient ID: [name], female    DOB: 07-Apr-1946  Age: 74 y.o. MRN: [id number]  CC:  Chief Complaint  Patient presents with  . Establish Care  . Other    Fatigue      HPI  This patient arrives today to establish care.  She is a 74 year old female with a past medical history significant for GERD, hypothyroidism following radioiodine therapy for the treatment of Graves' disease, hyperlipidemia, obesity, anxiety, and breast cancer of the left breast.  She generally seems to be feeling okay, but has been experiencing fatigue over the last couple of months.  She underwent radiation for her breast cancer treatment throughout the month of April.  In addition she has been experiencing some stressors related to caring for members of her family.  She also tells me that she did have one episode of chest pain that occurred last week.  She tells me it felt like an ice pick to her chest.  She does not note any trigger that caused her to come on, but it occurred most of the day.  She denied remembering experiencing any associated symptoms such as shortness of breath, diaphoresis, pain of jaw or arm, nausea, vomiting, or cardiac palpitations.  She did have blood work collected by Dr. Dorris Fetch (her endocrinologist) approximately 6 weeks ago.  This did show that her TSH and T4 are in the normal range, and A1c is in the prediabetic range.  It has been some time since metabolic panel, CBC, or lipid panel has been collected.  I do see that she is on a vitamin D supplement, and tells me last time that her vitamin D was checked it was low.   Past Medical History:  Diagnosis Date  . Allergy    food allergies, drug allergies  . Anemia   . Anxiety   . Arthritis   . Cataract   . Family history of breast cancer   . Family history of prostate cancer   . GERD (gastroesophageal reflux disease)   . Hyperlipidemia   . Malignant neoplasm of upper-outer quadrant of left breast in female, estrogen  receptor negative ([place]) 06/11/2019   DCIS left breast  . Pre-diabetes    no meds  . Thyroid disease    Grave's      Family History  Problem Relation Age of Onset  . Breast cancer Maternal Grandmother        dx over 83  . Prostate cancer Maternal Grandfather   . Arthritis Mother   . COPD Mother   . Breast cancer Mother 29       second at age 46  . Cancer Father        throat  . Stroke Father   . Cancer Maternal Aunt [address] Lacks, cervical cancer  . Breast cancer Maternal Aunt        dx over 43  . Breast cancer Paternal Aunt        dx under 39  . Prostate cancer Maternal Uncle        dx over 13  . Cancer Other        father's maternal cousin was Henreietta Lacks, Cervical  . Cancer Maternal Uncle        unknown cancer  . Breast cancer Paternal Aunt        dx over 63  . Colon cancer Neg Hx   . Allergic rhinitis Neg Hx   .  Angioedema Neg Hx   . Asthma Neg Hx   . Atopy Neg Hx   . Eczema Neg Hx   . Urticaria Neg Hx   . 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 Smoking status: Former Smoker    Quit date: 06/15/1998    Years since quitting: 21.3  . Smokeless tobacco: Never Used  . Tobacco comment: quit 2002  Substance Use Topics  . Alcohol use: No     Current Meds  Medication Sig  . acetaminophen (TYLENOL) 500 MG tablet Take 500 mg by mouth every 6 (six) hours as needed.  . Ascorbic Acid (VITAMIN C WITH ROSE HIPS) 500 MG tablet Take 500 mg by mouth daily.  . cholecalciferol (VITAMIN D3) 25 MCG (1000 UNIT) tablet Take 1,000 Units by mouth daily.  Marland Kitchen EPINEPHrine 0.3 mg/0.3 mL IJ SOAJ injection Inject 0.3 mLs (0.3 mg total) into the skin as needed.  . famotidine (PEPCID) 20 MG tablet Take 20 mg by  mouth 2 (two) times daily as needed.   Marland Kitchen levocetirizine (XYZAL) 5 MG tablet Take 2.5 mg by mouth every evening.   [name] VERIO test strip USE TO CHECK BLOOD SUGAR TWICE DAILY  . SYNTHROID 112 MCG tablet Take 1 tablet (112 mcg total) by mouth daily.    ROS:  See HPI   Objective:   Today's Vitals: BP 92/64   Pulse 68   Temp 98.6 F (37 C) (Temporal)   Ht '5\' 6"'$  (1.676 m)   Wt 210 lb 6.4 oz (95.4 kg)   SpO2 98%   BMI 33.96 kg/m  Vitals with BMI 10/21/2019 10/21/2019 09/09/2019  Height - '5\' 6"'$  '5\' 6"'$   Weight - 210 lbs 6 oz 200 lbs  BMI - 12.75 17.0  Systolic 92 80 (No Data)  Diastolic 64 60 (No Data)  Pulse - 68 -     Physical Exam Vitals reviewed.  Constitutional:      General: She is not in acute distress.    Appearance: Normal appearance.  HENT:     Head: Normocephalic and atraumatic.  Cardiovascular:     Rate and Rhythm: Normal rate and regular rhythm.     Pulses: Normal pulses.     Heart sounds: Normal heart sounds.  Pulmonary:     Effort: Pulmonary effort is normal.     Breath sounds: Normal breath sounds.  Skin:    General: Skin is warm and dry.  Neurological:     General: No focal deficit present.     Mental Status: She is alert and oriented to person, place, and time.  Psychiatric:        Mood and Affect: Mood normal.        Behavior: Behavior normal.        Judgment: Judgment normal.    EKG: NSR      Assessment and Plan   1. Fatigue, unspecified type   2. Vitamin D deficiency   3. Hypothyroidism following radioiodine therapy   4. Hyperlipidemia, unspecified hyperlipidemia type   5. Prediabetes   6. Hypotension, unspecified hypotension type      Plan: 1.-6.  We will start by collecting  additional blood work today for further evaluation.  Blood pressure was quite low initially, it did improve slightly upon recheck.  She tells me that normally her blood pressure runs around 438 systolically.  She does have a cuff at home but is not sure how  accurate it is.  She seems to be asymptomatic other than experiencing some fatigue.  I have provided her with a prescription for a new automatic blood pressure cuff, so she can monitor her blood pressure at home.  I also encouraged her to maintain proper hydration throughout the day.  She was encouraged to follow-up with her endocrinologist, radiation oncologist, and with [place] in the next couple months for follow-up.  Further recommendations may be made based upon blood results.   Tests ordered Orders Placed This Encounter  Procedures  . CBC  . CMP with eGFR([facility])  . Lipid Panel  . Vitamin D, 25-hydroxy      Meds ordered this encounter  Medications  . DISCONTD: Blood Pressure Monitoring (B-D ASSURE BPM/AUTO ARM CUFF) MISC    Sig: Check your BP once daily    Dispense:  1 each    Refill:  0    Order Specific Question:   Supervising Provider    Answer:   Hurshel Party C [[id number]]  . Blood Pressure Monitoring (B-D ASSURE BPM/AUTO ARM CUFF) MISC    Sig: Check your BP once daily    Dispense:  1 each    Refill:  0    Order Specific Question:   Supervising Provider    Answer:   Doree Albee [[id number]]    Patient to follow-up in 2 months or sooner as needed.  Ailene Ards, NP

## 2019-10-21 NOTE — Telephone Encounter (Signed)
Kara [name] is calling stating that she say her therapist today after your visit and she states that her therapist brought it to her attention that her fatigue could possibly be coming from her being exposed to covid 3 weeks ago even though she has been double vaccinated, please advise?

## 2019-10-21 NOTE — Therapy (Signed)
[facility] [address] [place], [place], [address] Phone: [phone]   Fax:  [phone]  Physical Therapy Treatment  Patient Details  Name: Kara [name] MRN: [id number] Date of Birth: 1946-05-10 Referring Provider (PT): Sanjuana Kava   Encounter Date: 10/21/2019  PT End of Session - 10/21/19 1400    Visit Number  5    Number of Visits  12    Date for PT Re-Evaluation  10/28/19    Authorization Type  Medicare, BCBS as secondary, no auth. VL [id number]    Progress Note Due on Visit  10    PT Start Time  1400    PT Stop Time  1440    PT Time Calculation (min)  40 min    Behavior During Therapy  WFL for tasks assessed/performed       Past Medical History:  Diagnosis Date  . Allergy    food allergies, drug allergies  . Anemia   . Anxiety   . Arthritis   . Cataract   . Family history of breast cancer   . Family history of prostate cancer   . GERD (gastroesophageal reflux disease)   . Hyperlipidemia   . Malignant neoplasm of upper-outer quadrant of left breast in female, estrogen receptor negative ([place]) 06/11/2019   DCIS left breast  . Pre-diabetes    no meds  . Thyroid disease    Grave's    Past Surgical History:  Procedure Laterality Date  . ABDOMINAL HYSTERECTOMY     bleeding. fibroids  . BREAST LUMPECTOMY WITH RADIOACTIVE SEED LOCALIZATION Left 07/08/2019   Procedure: LEFT BREAST LUMPECTOMY WITH RADIOACTIVE SEED LOCALIZATION;  Surgeon: Jovita Kussmaul, MD;  Location: [facility];  Service: General;  Laterality: Left;  . CESAREAN SECTION    . CHOLECYSTECTOMY    . COLONOSCOPY  2011   [place]: two 3-4 mm sessile polyps, hyperplastic, sigmoid diverticula, TI appeared normal.   . ESOPHAGOGASTRODUODENOSCOPY  2011   [place]: non-bleeding erosive gastropathy, normal duodenum. path: unremarkable duodenum, negative H.pylori, minimal esophagitis   . TONSILLECTOMY  1975    There were no vitals filed for this  visit.  Subjective Assessment - 10/21/19 1431    Subjective  States that her she has been doing her exercises and they really do help. the medial knee soreness ahs gone away. States that she still has pain in the groin and lateral knee. Still having some occasional discomfort down the front of her lower leg. States she had to take a break from PT due to son having COVID + test. States she has been extremely exhausted.    Pertinent History  breast cancer (last treatment this month)    Limitations  Standing;Walking    Pain Onset  More than a month ago         [facility] PT Assessment - 10/21/19 0001      Assessment   Medical Diagnosis  right knee pain    Referring Provider (PT)  [facility] PT Treatment/Exercise - 10/21/19 0001      Knee/Hip Exercises: Seated   Other Seated Knee/Hip Exercises  towel roll across small of back - with extension - 6 minutes    Other Seated Knee/Hip Exercises  towel roll down spine - at wall - 6 minutes; hip abd wide with no resistance 3x10 5" holds B  Manual Therapy   Manual Therapy  Joint mobilization;Soft tissue mobilization    Manual therapy comments  completed separately from other skilled interventions    Joint Mobilization  gentle PA to lumbar spine - grade II/II - tolerated moderately well - seated    Soft tissue mobilization  gentle STM to lumbar paraspinals and glute med - tolerated moderately well.              PT Education - 10/21/19 1431    Education Details  educated patient in current fatigue symptoms, recent COVID exposure and possible infection with COVID secondary to timeline and symptoms    Person(s) Educated  Patient    Methods  Explanation    Comprehension  Verbalized understanding       PT Short Term Goals - 09/16/19 1405      PT SHORT TERM GOAL #1   Title  Patient will be independent in self management strategies to improve quality of life and functional outcomes.    Time   3    Period  Weeks    Status  New    Target Date  10/07/19      PT SHORT TERM GOAL #2   Title  Patient will report at least 25% improvement in overall symptoms and/or functional ability.    Time  3    Period  Weeks    Status  New    Target Date  10/07/19      PT SHORT TERM GOAL #3   Title  Patient will report not waking up in the middle of the night secondary to pain to improve overall sleep quality.    Time  3    Period  Weeks    Status  New    Target Date  10/07/19        PT Long Term Goals - 09/16/19 1405      PT LONG TERM GOAL #1   Title  Patient will be able to drive her car without severe pain to demonstrate improved functional mobility or right lower extremity.    Time  6    Period  Weeks    Status  New    Target Date  10/28/19      PT LONG TERM GOAL #2   Title  Patient will score with < 30% limitation on FOTO to demonstrate improved functional mobility and tolerance.    Time  6    Period  Weeks    Status  New    Target Date  10/28/19      PT LONG TERM GOAL #3   Title  Patient will report at least 50% improvement in overall symptoms and/or functional ability.    Time  6    Period  Weeks    Status  New    Target Date  10/28/19            Plan - 10/21/19 1431    Clinical Impression Statement  Focused on mobility and progressing HEP. Tolerated well with fatigue noted end of session (muscle fatigue) and less pain. Patient reported decreased hip and leg pain end of session.  Discussed recent onset of fatigue with recent exposure to covid and possibility of having COVID despite vaccination. Answered all questions and will continue with POC next session.    Personal Factors and Comorbidities  Comorbidity 2;Comorbidity 1    Comorbidities  chronic swelling in bilateral lower extremities, breast cancer    Examination-Activity Limitations  Stand;Locomotion Level;Stairs;Sleep  Examination-Participation Restrictions  Cleaning;Community Activity;Driving;Meal Prep     Stability/Clinical Decision Making  Evolving/Moderate complexity    Rehab Potential  Good    PT Frequency  2x / week    PT Duration  6 weeks    PT Treatment/Interventions  ADLs/Self Care Home Management;Aquatic Therapy;Biofeedback;Cryotherapy;Electrical Stimulation;Moist Heat;Traction;Balance training;Therapeutic exercise;Therapeutic activities;Functional mobility training;Stair training;Gait training;DME Instruction;Neuromuscular re-education;Cognitive remediation;Patient/family education;Manual techniques;Dry needling;Passive range of motion;Joint Manipulations    PT Next Visit Plan  f/u with psoas/ PA pf hip and lumbar spine as tolerated - alt positions and exercises for patient comfort (towelroll; across lumbar spine supine. ). tib fib mobilizations, STM, ankle mobility and strengthening    PT Home Exercise Plan  5/4 calf stretch; 5/11 patella mobilization, thomas stretch, SKC, sciatic nerve glide    Consulted and Agree with Plan of Care  Patient       Patient will benefit from skilled therapeutic intervention in order to improve the following deficits and impairments:  Pain, Difficulty walking, Decreased strength, Decreased activity tolerance, Decreased range of motion, Increased edema, Decreased endurance  Visit Diagnosis: Chronic pain of right knee  Difficulty in walking, not elsewhere classified     Problem List Patient Active Problem List   Diagnosis Date Noted  . Chronic pain of left knee 09/03/2019  . Genetic testing 08/11/2019  . Family history of breast cancer   . Family history of prostate cancer   . 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  . Weight loss, unintentional 03/27/2019  . Hip pain, acute, left 03/27/2019  . 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 Urination pain 01/16/2019  . Vaginal discharge 01/16/2019  . Anxiety 01/12/2019  . Sinusitis 01/06/2019  . Headache in back of head 12/31/2018  . Splinter in skin  12/23/2018  . Insomnia due to stress 12/23/2018  . H/O swallowed foreign body 11/20/2018  . Change in bowel function 04/09/2018  . Breast pain, left 02/25/2018  . Prediabetes 08/30/2017  . Dyspepsia 09/29/2016  . Colon polyps 08/23/2016  . Family history of cancer 08/23/2016  . HLD (hyperlipidemia) 08/23/2016  . Obesity, Class II, BMI 35-39.9, with comorbidity 08/23/2016  . GERD (gastroesophageal reflux disease) 08/14/2016  . Hypothyroid 08/14/2016  . Perimenopausal vasomotor symptoms 02/11/2016  . Postmenopausal atrophic vaginitis 11/09/2015    2:49 PM, 10/21/19 Jerene Pitch, DPT Physical Therapy with [facility]  [phone] office  [facility] [address] [place], [place], [address] Phone: [phone]   Fax:  [phone]  Name: Elli Groesbeck MRN: [id number] Date of Birth: [birth year]

## 2019-10-21 NOTE — Telephone Encounter (Signed)
If the patient is concerned about possible COVID-19 infection, she certainly should go to be tested for COVID-19.  If she would like to be tested by [facility] health she can call [phone] to schedule a test time.  I believe there is a location in [place] for this testing.  If she chooses not to test, then CDC guidelines recommend that she quarantine for 10 days from time of symptom onset, and stay quarantined until she has not had a fever for at least 3 consecutive days.

## 2019-10-22 NOTE — Telephone Encounter (Signed)
I left her a voicemail to return my call.

## 2019-10-22 NOTE — Progress Notes (Signed)
Radiation Oncology         [phone] ________________________________  Name: Kara [name] MRN: [id number]  Date: 10/23/2019  DOB: 11/08/1945  Follow-Up Visit Note  CC: Doree Albee, MD  Maryruth Hancock, MD    ICD-10-CM   1. Ductal carcinoma in situ (DCIS) of left breast  D05.12     Diagnosis: Stage 0 (cN0, M0) Left Breast UOQ, DCIS, ER- / PR-, Grade 3  Interval Since Last Radiation: One month, three weeks, and one day.  Radiation Treatment Dates: 08/11/2019 through 09/01/2019 Site Technique Total Dose (Gy) Dose per Fx (Gy) Completed Fx Beam Energies  Breast, Left: Breast_Lt 3D 42.72/42.72 2.67 16/16 6X    Narrative:  The patient returns today for routine follow-up. The patient was initially scheduled to be seen on 10/02/2019 but had to reschedule appointment secondary to COVID-19 exposure. No significant interval history since the end of treatment.  On review of systems, the patient reports extreme exhaustion and tenderness in her left breast rated 6/10. The patient denies nipple discharge or bleeding notices some tenderness and swelling along the lumpectomy scar area with some firmness to this region.            ALLERGIES:  is allergic to prednisone, other, and naproxen.  Meds: Current Outpatient Medications  Medication Sig Dispense Refill  . acetaminophen (TYLENOL) 500 MG tablet Take 500 mg by mouth every 6 (six) hours as needed.    . Ascorbic Acid (VITAMIN C WITH ROSE HIPS) 500 MG tablet Take 500 mg by mouth daily.    . Blood Pressure Monitoring (B-D ASSURE BPM/AUTO ARM CUFF) MISC Check your BP once daily 1 each 0  . cholecalciferol (VITAMIN D3) 25 MCG (1000 UNIT) tablet Take 1,000 Units by mouth daily.    Marland Kitchen EPINEPHrine 0.3 mg/0.3 mL IJ SOAJ injection Inject 0.3 mLs (0.3 mg total) into the skin as needed. 2 each 2  . famotidine (PEPCID) 20 MG tablet Take 20 mg by mouth 2 (two) times daily as needed.     Marland Kitchen levocetirizine (XYZAL) 5 MG tablet Take 2.5 mg by mouth every  evening.     [name] VERIO test strip USE TO CHECK BLOOD SUGAR TWICE DAILY 100 each 1  . SYNTHROID 112 MCG tablet Take 1 tablet (112 mcg total) by mouth daily. 90 tablet 1   No current facility-administered medications for this encounter.    Physical Findings: The patient is in no acute distress. Patient is alert and oriented.  height is 5\' 6"  (1.676 m) and weight is 213 lb 3.2 oz (96.7 kg). Her temperature is 97.4 F (36.3 C) (abnormal). Her blood pressure is 120/50 (abnormal) and her pulse is 74. Her respiration is 20 and oxygen saturation is 100%.   Lungs are clear to auscultation bilaterally. Heart has regular rate and rhythm. No palpable cervical, supraclavicular, or axillary adenopathy. Abdomen soft, non-tender, normal bowel sounds. Right breast: No palpable mass, nipple discharge, or bleeding. Left breast: Shows some mild hyperpigmentation changes.  Some induration at the lumpectomy scar.  No dominant mass appreciated breast nipple discharge or bleeding.  Skin has healed well  Lab Findings: Lab Results  Component Value Date   WBC 5.3 10/22/2019   HGB 13.8 10/22/2019   HCT 43.5 10/22/2019   MCV 84.6 10/22/2019   PLT 189 10/22/2019    Radiographic Findings: No results found.  Impression: Stage 0 (cN0, M0) Left Breast UOQ, DCIS, ER- / PR-, Grade 3  The patient is recovering from the effects  of radiation.  The etiology of the patient's severe fatigue is unknown.  She has had recent thyroid function studies as well as blood test for anemia none neither significant.  I explained to the patient that we would not expect this amount of fatigue from her breast radiation treatments and her fatigue should be proved over a month out from her treatment.  Plan: The patient is scheduled to follow-up with Wilber Bihari, NP, on 11/26/2019. Follow-up with radiation oncology in 3 months.  I recommend she discuss her fatigue further with her primary care physician if this continues to be an  issue..  ____________________________________   Blair Promise, PhD, MD  This document serves as a record of services personally performed by Gery Pray, MD. It was created on his behalf by Clerance Lav, a trained medical scribe. The creation of this record is based on the scribe's personal observations and the provider's statements to them. This document has been checked and approved by the attending provider.

## 2019-10-23 ENCOUNTER — Ambulatory Visit (HOSPITAL_COMMUNITY): Payer: Medicare Other | Admitting: Physical Therapy

## 2019-10-23 ENCOUNTER — Encounter (HOSPITAL_COMMUNITY): Payer: Self-pay | Admitting: Physical Therapy

## 2019-10-23 ENCOUNTER — Ambulatory Visit
Admission: RE | Admit: 2019-10-23 | Discharge: 2019-10-23 | Disposition: A | Discharge status: Home / Self Care | Payer: Medicare Other | Source: Ambulatory Visit | Attending: Radiation Oncology | Admitting: Radiation Oncology

## 2019-10-23 ENCOUNTER — Other Ambulatory Visit: Payer: Self-pay

## 2019-10-23 DIAGNOSIS — M25561 Pain in right knee: Secondary | ICD-10-CM | POA: Diagnosis not present

## 2019-10-23 DIAGNOSIS — G8929 Other chronic pain: Secondary | ICD-10-CM

## 2019-10-23 DIAGNOSIS — Z510 Encounter for antineoplastic radiation therapy: Secondary | ICD-10-CM | POA: Diagnosis not present

## 2019-10-23 DIAGNOSIS — R262 Difficulty in walking, not elsewhere classified: Secondary | ICD-10-CM

## 2019-10-23 DIAGNOSIS — D0512 Intraductal carcinoma in situ of left breast: Secondary | ICD-10-CM | POA: Insufficient documentation

## 2019-10-23 LAB — COMPLETE METABOLIC PANEL WITH GFR
AG Ratio: 1.3 (calc) (ref 1.0–2.5)
ALT: 30 U/L — ABNORMAL HIGH (ref 6–29)
AST: 28 U/L (ref 10–35)
Albumin: 3.8 g/dL (ref 3.6–5.1)
Alkaline phosphatase (APISO): 119 U/L (ref 37–153)
BUN: 17 mg/dL (ref 7–25)
CO2: 28 mmol/L (ref 20–32)
Calcium: 9.4 mg/dL (ref 8.6–10.4)
Chloride: 106 mmol/L (ref 98–110)
Creat: 0.83 mg/dL (ref 0.60–0.93)
GFR, Est African American: 82 mL/min/{1.73_m2} (ref >=60)
GFR, Est Non African American: 70 mL/min/{1.73_m2} (ref >=60)
Globulin: 3 g/dL (calc) (ref 1.9–3.7)
Glucose, Bld: 113 mg/dL — ABNORMAL HIGH (ref 65–99)
Potassium: 4.5 mmol/L (ref 3.5–5.3)
Sodium: 139 mmol/L (ref 135–146)
Total Bilirubin: 0.5 mg/dL (ref 0.2–1.2)
Total Protein: 6.8 g/dL (ref 6.1–8.1)

## 2019-10-23 LAB — CBC
HCT: 43.5 % (ref 35.0–45.0)
Hemoglobin: 13.8 g/dL (ref 11.7–15.5)
MCH: 26.8 pg — ABNORMAL LOW (ref 27.0–33.0)
MCHC: 31.7 g/dL — ABNORMAL LOW (ref 32.0–36.0)
MCV: 84.6 fL (ref 80.0–100.0)
MPV: 11.9 fL (ref 7.5–12.5)
Platelets: 189 10*3/uL (ref 140–400)
RBC: 5.14 10*6/uL — ABNORMAL HIGH (ref 3.80–5.10)
RDW: 15.3 % — ABNORMAL HIGH (ref 11.0–15.0)
WBC: 5.3 10*3/uL (ref 3.8–10.8)

## 2019-10-23 LAB — LIPID PANEL
Cholesterol: 194 mg/dL (ref <200)
HDL: 64 mg/dL (ref >=50)
LDL Cholesterol (Calc): 114 mg/dL (calc) — ABNORMAL HIGH
Non-HDL Cholesterol (Calc): 130 mg/dL (calc) — ABNORMAL HIGH (ref <130)
Total CHOL/HDL Ratio: 3 (calc) (ref <5.0)
Triglycerides: 68 mg/dL (ref <150)

## 2019-10-23 LAB — VITAMIN D 25 HYDROXY (VIT D DEFICIENCY, FRACTURES): Vit D, 25-Hydroxy: 31 ng/mL (ref 30–100)

## 2019-10-23 NOTE — Therapy (Signed)
[facility] [address] [place], [place], [address] Phone: [phone]   Fax:  [phone]  Physical Therapy Treatment  Patient Details  Name: Kara [name] MRN: [id number] Date of Birth: November 24, 1945 Referring Provider (PT): Sanjuana Kava   Encounter Date: 10/23/2019   PT End of Session - 10/23/19 1617    Visit Number 6    Number of Visits 12    Date for PT Re-Evaluation 10/28/19    Authorization Type Medicare, BCBS as secondary, no auth. VL 75    Progress Note Due on Visit 10    PT Start Time 1615    PT Stop Time 1655    PT Time Calculation (min) 40 min    Behavior During Therapy WFL for tasks assessed/performed           Past Medical History:  Diagnosis Date  . Allergy    food allergies, drug allergies  . Anemia   . Anxiety   . Arthritis   . Cataract   . Family history of breast cancer   . Family history of prostate cancer   . GERD (gastroesophageal reflux disease)   . Hyperlipidemia   . Malignant neoplasm of upper-outer quadrant of left breast in female, estrogen receptor negative ([place]) 06/11/2019   DCIS left breast  . Pre-diabetes    no meds  . Thyroid disease    Grave's    Past Surgical History:  Procedure Laterality Date  . ABDOMINAL HYSTERECTOMY     bleeding. fibroids  . BREAST LUMPECTOMY WITH RADIOACTIVE SEED LOCALIZATION Left 07/08/2019   Procedure: LEFT BREAST LUMPECTOMY WITH RADIOACTIVE SEED LOCALIZATION;  Surgeon: Jovita Kussmaul, MD;  Location: [facility];  Service: General;  Laterality: Left;  . CESAREAN SECTION    . CHOLECYSTECTOMY    . COLONOSCOPY  2011   [place]: two 3-4 mm sessile polyps, hyperplastic, sigmoid diverticula, TI appeared normal.   . ESOPHAGOGASTRODUODENOSCOPY  2011   [place]: non-bleeding erosive gastropathy, normal duodenum. path: unremarkable duodenum, negative H.pylori, minimal esophagitis   . TONSILLECTOMY  1975    There were no vitals filed for this visit.    Subjective Assessment - 10/23/19 1642    Subjective States that she is still having the pain on the front of her shin with pressing on a gas pedal and then outside of her right knee started to bother her with walking.    Pertinent History breast cancer (last treatment this month)    Limitations Standing;Walking    Currently in Pain? Yes    Pain Score 3     Pain Location Knee    Pain Orientation Right;Anterior    Pain Descriptors / Indicators Aching    Pain Onset More than a month ago              [facility] PT Assessment - 10/23/19 0001      Assessment   Medical Diagnosis right knee pain    Referring Provider (PT) [facility] Adult PT Treatment/Exercise - 10/23/19 0001      Knee/Hip Exercises: Seated   Other Seated Knee/Hip Exercises trunk rotation seated x10 5" holds B       Knee/Hip Exercises: Supine   Other Supine Knee/Hip Exercises diaphragmatic breathing - visual,verbal and tactile cues - 8 minutes of practice supine.     Other Supine Knee/Hip Exercises use  metronome with 11 beats per minute - can search this and breath in/out with the beat. - practiced breathing to this beat 27minutes                  PT Education - 10/23/19 1642    Education Details Educated patient on fascial slings and how pain can increase due to fascial tightness elsewhere. educated patient on diaphragmatic breathing and how to perform, prior demonstration.    Person(s) Educated Patient    Methods Explanation    Comprehension Verbalized understanding            PT Short Term Goals - 09/16/19 1405      PT SHORT TERM GOAL #1   Title Patient will be independent in self management strategies to improve quality of life and functional outcomes.    Time 3    Period Weeks    Status New    Target Date 10/07/19      PT SHORT TERM GOAL #2   Title Patient will report at least 25% improvement in overall symptoms and/or functional ability.    Time 3     Period Weeks    Status New    Target Date 10/07/19      PT SHORT TERM GOAL #3   Title Patient will report not waking up in the middle of the night secondary to pain to improve overall sleep quality.    Time 3    Period Weeks    Status New    Target Date 10/07/19             PT Long Term Goals - 09/16/19 1405      PT LONG TERM GOAL #1   Title Patient will be able to drive her car without severe pain to demonstrate improved functional mobility or right lower extremity.    Time 6    Period Weeks    Status New    Target Date 10/28/19      PT LONG TERM GOAL #2   Title Patient will score with < 30% limitation on FOTO to demonstrate improved functional mobility and tolerance.    Time 6    Period Weeks    Status New    Target Date 10/28/19      PT LONG TERM GOAL #3   Title Patient will report at least 50% improvement in overall symptoms and/or functional ability.    Time 6    Period Weeks    Status New    Target Date 10/28/19                 Plan - 10/23/19 1617    Clinical Impression Statement Right leg pain reproduced and intensified with cervical flexion, indicating increased tension along spiral fascial sling. Focused on breath work to help promote rib cage and fascial sling mobility. Tolerated well but needed lots of verbal and tactile cues to perform properly. Continued pain in leg noted end of session, will focus on fascial stretches and mobilization next session.    Personal Factors and Comorbidities Comorbidity 2;Comorbidity 1    Comorbidities chronic swelling in bilateral lower extremities, breast cancer    Examination-Activity Limitations Stand;Locomotion Level;Stairs;Sleep    Examination-Participation Restrictions Cleaning;Community Activity;Driving;Meal Prep    Stability/Clinical Decision Making Evolving/Moderate complexity    Rehab Potential Good    PT Frequency 2x / week    PT Duration 6 weeks    PT Treatment/Interventions ADLs/Self Care Home  Management;Aquatic Therapy;Biofeedback;Cryotherapy;Electrical Stimulation;Moist Heat;Traction;Balance training;Therapeutic exercise;Therapeutic activities;Functional  mobility training;Stair training;Gait training;DME Instruction;Neuromuscular re-education;Cognitive remediation;Patient/family education;Manual techniques;Dry needling;Passive range of motion;Joint Manipulations    PT Next Visit Plan f/u with breath work and add fascial stretches/movements for spiral fasical sling.  tib fib mobilizations, STM, ankle mobility and strengthening    PT Home Exercise Plan 5/4 calf stretch; 5/11 patella mobilization, thomas stretch, SKC, sciatic nerve glide    Consulted and Agree with Plan of Care Patient           Patient will benefit from skilled therapeutic intervention in order to improve the following deficits and impairments:  Pain, Difficulty walking, Decreased strength, Decreased activity tolerance, Decreased range of motion, Increased edema, Decreased endurance  Visit Diagnosis: Chronic pain of right knee  Difficulty in walking, not elsewhere classified     Problem List Patient Active Problem List   Diagnosis Date Noted  . Chronic pain of left knee 09/03/2019  . Genetic testing 08/11/2019  . Family history of breast cancer   . Family history of prostate cancer   . 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  . Weight loss, unintentional 03/27/2019  . Hip pain, acute, left 03/27/2019  . 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 Urination pain 01/16/2019  . Vaginal discharge 01/16/2019  . Anxiety 01/12/2019  . Sinusitis 01/06/2019  . Headache in back of head 12/31/2018  . Splinter in skin 12/23/2018  . Insomnia due to stress 12/23/2018  . H/O swallowed foreign body 11/20/2018  . Change in bowel function 04/09/2018  . Breast pain, left 02/25/2018  . Prediabetes 08/30/2017  . Dyspepsia 09/29/2016  . Colon polyps 08/23/2016  . Family history of  cancer 08/23/2016  . HLD (hyperlipidemia) 08/23/2016  . Obesity, Class II, BMI 35-39.9, with comorbidity 08/23/2016  . GERD (gastroesophageal reflux disease) 08/14/2016  . Hypothyroid 08/14/2016  . Perimenopausal vasomotor symptoms 02/11/2016  . Postmenopausal atrophic vaginitis 11/09/2015  5:05 PM, 10/23/19 Jerene Pitch, DPT Physical Therapy with [facility]  [phone] office  [facility] [address] [place], [place], [address] Phone: [phone]   Fax:  [phone]  Name: Kara [name] MRN: [id number] Date of Birth: Aug 31, 1945

## 2019-10-23 NOTE — Progress Notes (Signed)
Patient here for a one month f/u visit with Dr. Sondra Come. She reports tendernesss in her left breast that is a 6. Pt. Also reports extreme exhaustion.  BP (!) 120/50 (BP Location: Left Arm, Patient Position: Sitting, Cuff Size: Normal)   Pulse 74   Temp (!) 97.4 F (36.3 C)   Resp 20   Ht 5\' 6"  (1.676 m)   Wt 213 lb 3.2 oz (96.7 kg)   SpO2 100%   BMI 34.41 kg/m    Wt Readings from Last 3 Encounters:  10/23/19 213 lb 3.2 oz (96.7 kg)  10/21/19 210 lb 6.4 oz (95.4 kg)  09/09/19 200 lb (90.7 kg)

## 2019-10-28 ENCOUNTER — Other Ambulatory Visit: Payer: Self-pay

## 2019-10-28 ENCOUNTER — Ambulatory Visit (HOSPITAL_COMMUNITY): Payer: Medicare Other | Admitting: Physical Therapy

## 2019-10-28 DIAGNOSIS — M25561 Pain in right knee: Secondary | ICD-10-CM | POA: Diagnosis not present

## 2019-10-28 DIAGNOSIS — R262 Difficulty in walking, not elsewhere classified: Secondary | ICD-10-CM

## 2019-10-28 DIAGNOSIS — G8929 Other chronic pain: Secondary | ICD-10-CM

## 2019-10-28 NOTE — Therapy (Signed)
[facility] [address] [place], [place], [address] Phone: [phone]   Fax:  [phone]  Physical Therapy Treatment Progress Note and Recert  Patient Details  Name: Kara [name] MRN: [id number] Date of Birth: Jun 19, 1945 Referring Provider (PT): Sanjuana Kava  Progress Note Reporting Period 09/16/19 to 10/28/19  See note below for Objective Data and Assessment of Progress/Goals.       Encounter Date: 10/28/2019   PT End of Session - 10/28/19 1137    Visit Number 7    Number of Visits 24    Date for PT Re-Evaluation 10/28/19    Authorization Type Medicare, BCBS as secondary, no auth. VL 75    Authorization - Visit Number 7    Authorization - Number of Visits 75    Progress Note Due on Visit 17    PT Start Time 1130    PT Stop Time 1210    PT Time Calculation (min) 40 min    Behavior During Therapy WFL for tasks assessed/performed           Past Medical History:  Diagnosis Date  . Allergy    food allergies, drug allergies  . Anemia   . Anxiety   . Arthritis   . Cataract   . Family history of breast cancer   . Family history of prostate cancer   . GERD (gastroesophageal reflux disease)   . Hyperlipidemia   . Malignant neoplasm of upper-outer quadrant of left breast in female, estrogen receptor negative ([place]) 06/11/2019   DCIS left breast  . Pre-diabetes    no meds  . Thyroid disease    Grave's    Past Surgical History:  Procedure Laterality Date  . ABDOMINAL HYSTERECTOMY     bleeding. fibroids  . BREAST LUMPECTOMY WITH RADIOACTIVE SEED LOCALIZATION Left 07/08/2019   Procedure: LEFT BREAST LUMPECTOMY WITH RADIOACTIVE SEED LOCALIZATION;  Surgeon: Jovita Kussmaul, MD;  Location: [facility];  Service: General;  Laterality: Left;  . CESAREAN SECTION    . CHOLECYSTECTOMY    . COLONOSCOPY  2011   [place]: two 3-4 mm sessile polyps, hyperplastic, sigmoid diverticula, TI appeared normal.   .  ESOPHAGOGASTRODUODENOSCOPY  2011   [place]: non-bleeding erosive gastropathy, normal duodenum. path: unremarkable duodenum, negative H.pylori, minimal esophagitis   . TONSILLECTOMY  1975    There were no vitals filed for this visit.   Subjective Assessment - 10/28/19 1136    Subjective States that her right knee is sore to touch but she hasn't done anything to aggravate it either. States that she is sitting more. States that she feels 70% better in regards to symptoms but her function has regressed..    Pertinent History breast cancer (last treatment this month)    Limitations Standing;Walking    Currently in Pain? Yes    Pain Location Knee    Pain Orientation Right;Anterior    Pain Onset More than a month ago              [facility] PT Assessment - 10/28/19 0001      Assessment   Medical Diagnosis right knee pain    Referring Provider (PT) Sanjuana Kava      Observation/Other Assessments   Focus on Therapeutic Outcomes (FOTO)  57% function    was 67% function      Observation/Other Assessments-Edema    Edema --   increased swelling bilaterally      AROM   Right Knee Extension 0  Right Knee Flexion 130    Left Knee Extension 0    Left Knee Flexion 130    Right Ankle Dorsiflexion 0    Right Ankle Plantar Flexion 60   pulling on anterior shin    Left Ankle Dorsiflexion 0    Left Ankle Plantar Flexion 60      Ambulation/Gait   Ambulation/Gait Yes    Ambulation/Gait Assistance 7: Independent    Assistive device None    Gait Pattern --   decreased arm swing   Ambulation Surface Level;Indoor    Gait velocity WNL    Gait Comments cues to increase left arm swing; 5:45; no pain noted                         [facility] Adult PT Treatment/Exercise - 10/28/19 0001      Knee/Hip Exercises: Seated   Other Seated Knee/Hip Exercises self traction with arms on armrests - painin left shoulder - with stick across lap - x10 10" holds - felt good in back     Other Seated  Knee/Hip Exercises seated DF/PF with knee extension stretch 2x 20 R, lumbar flexion x10 10" holds                   PT Education - 10/28/19 1244    Education Details on getting new pair of compression stockings. Educated patietn on [employer] and performing stretches prior and to stop and perform them if symptoms present.    Person(s) Educated Patient    Methods Explanation    Comprehension Verbalized understanding            PT Short Term Goals - 10/28/19 1315      PT SHORT TERM GOAL #1   Title Patient will be independent in self management strategies to improve quality of life and functional outcomes.    Time 3    Period Weeks    Status On-going    Target Date 10/07/19      PT SHORT TERM GOAL #2   Title Patient will report at least 25% improvement in overall symptoms and/or functional ability.    Baseline 75% better    Time 3    Period Weeks    Status Achieved    Target Date 10/07/19      PT SHORT TERM GOAL #3   Title Patient will report not waking up in the middle of the night secondary to pain to improve overall sleep quality.    Baseline no longer waking up due to leg pain    Time 3    Period Weeks    Status Achieved    Target Date 10/07/19             PT Long Term Goals - 10/28/19 1315      PT LONG TERM GOAL #1   Title Patient will be able to drive her car without severe pain to demonstrate improved functional mobility or right lower extremity.    Baseline no severe pain with driving    Time 6    Period Weeks    Status Achieved      PT LONG TERM GOAL #2   Title Patient will score with < 30% limitation on FOTO to demonstrate improved functional mobility and tolerance.    Time 6    Period Weeks    Status On-going      PT LONG TERM GOAL #3   Title Patient will report at  least 50% improvement in overall symptoms and/or functional ability.    Baseline 75% better    Time 6    Period Weeks    Status Achieved      PT LONG TERM GOAL #4    Title Patient will be able to ambulate at least 15 minutes without severe pain to improve walking mobility.    Time 6    Period Weeks    Status New    Target Date 12/09/19                 Plan - 10/28/19 1319    Clinical Impression Statement Patient present for a recert on this date. She has met 2/3 short term goals and 2/3 long term goals. Despite improvement she has had an overall reduction in activity level and feels like she needs to get back to improving her overall function. FOTO score also regressed in overall function. Pain symptoms reduced but patient would continue to benefit from skilled physical therapy to improve overall functional mobility. Added walking goal to POC to work towards this.    Personal Factors and Comorbidities Comorbidity 2;Comorbidity 1    Comorbidities chronic swelling in bilateral lower extremities, breast cancer    Examination-Activity Limitations Stand;Locomotion Level;Stairs;Sleep    Examination-Participation Restrictions Cleaning;Community Activity;Driving;Meal Prep    Stability/Clinical Decision Making Evolving/Moderate complexity    Rehab Potential Good    PT Frequency 2x / week    PT Duration 6 weeks    PT Treatment/Interventions ADLs/Self Care Home Management;Aquatic Therapy;Biofeedback;Cryotherapy;Electrical Stimulation;Moist Heat;Traction;Balance training;Therapeutic exercise;Therapeutic activities;Functional mobility training;Stair training;Gait training;DME Instruction;Neuromuscular re-education;Cognitive remediation;Patient/family education;Manual techniques;Dry needling;Passive range of motion;Joint Manipulations    PT Next Visit Plan f/u with breath work and add fascial stretches/movements for spiral fasical sling.  tib fib mobilizations, STM, ankle mobility and strengthening    PT Home Exercise Plan 5/4 calf stretch; 5/11 patella mobilization, thomas stretch, SKC, sciatic nerve glide    Consulted and Agree with Plan of Care Patient            Patient will benefit from skilled therapeutic intervention in order to improve the following deficits and impairments:  Pain, Difficulty walking, Decreased strength, Decreased activity tolerance, Decreased range of motion, Increased edema, Decreased endurance  Visit Diagnosis: Chronic pain of right knee  Difficulty in walking, not elsewhere classified     Problem List Patient Active Problem List   Diagnosis Date Noted  . Chronic pain of left knee 09/03/2019  . Genetic testing 08/11/2019  . Family history of breast cancer   . Family history of prostate cancer   . 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  . Weight loss, unintentional 03/27/2019  . Hip pain, acute, left 03/27/2019  . 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 Urination pain 01/16/2019  . Vaginal discharge 01/16/2019  . Anxiety 01/12/2019  . Sinusitis 01/06/2019  . Headache in back of head 12/31/2018  . Splinter in skin 12/23/2018  . Insomnia due to stress 12/23/2018  . H/O swallowed foreign body 11/20/2018  . Change in bowel function 04/09/2018  . Breast pain, left 02/25/2018  . Prediabetes 08/30/2017  . Dyspepsia 09/29/2016  . Colon polyps 08/23/2016  . Family history of cancer 08/23/2016  . HLD (hyperlipidemia) 08/23/2016  . Obesity, Class II, BMI 35-39.9, with comorbidity 08/23/2016  . GERD (gastroesophageal reflux disease) 08/14/2016  . Hypothyroid 08/14/2016  . Perimenopausal vasomotor symptoms 02/11/2016  . Postmenopausal atrophic vaginitis 11/09/2015   1:20 PM, 10/28/19 Jerene Pitch, DPT Physical Therapy  with [facility]  [phone] office  [facility] [address] [place], [place], [address] Phone: [phone]   Fax:  [phone]  Name: Nakiya Rallis MRN: [id number] Date of Birth: May 27, 1945

## 2019-10-30 ENCOUNTER — Ambulatory Visit (HOSPITAL_COMMUNITY): Payer: Medicare Other | Admitting: Physical Therapy

## 2019-10-30 ENCOUNTER — Other Ambulatory Visit: Payer: Self-pay

## 2019-10-30 DIAGNOSIS — M25561 Pain in right knee: Secondary | ICD-10-CM | POA: Diagnosis not present

## 2019-10-30 DIAGNOSIS — R262 Difficulty in walking, not elsewhere classified: Secondary | ICD-10-CM

## 2019-10-30 DIAGNOSIS — G8929 Other chronic pain: Secondary | ICD-10-CM

## 2019-10-30 NOTE — Therapy (Addendum)
[facility] [address] [place], [place], [address] Phone: [phone]   Fax:  [phone]  Physical Therapy Treatment and Discharge Note  Patient Details  Name: Kara [name] MRN: [id number] Date of Birth: 07-25-1945 Referring Provider (PT): [facility] SUMMARY  Visits from Start of Care: 8  Current functional level related to goals / functional outcomes: Unable to assess due to unplanned discharge   Remaining deficits: Unable to assess due to unplanned discharge   Education / Equipment: Unable to assess due to unplanned discharge Plan: Patient agrees to discharge.  Patient goals were partially met. Patient is being discharged due to meeting the stated rehab goals.  ?????    8:11 AM, 03/09/20 Jerene Pitch, DPT Physical Therapy with [facility]  [phone] office   Encounter Date: 10/30/2019   PT End of Session - 10/30/19 1204    Visit Number 8    Number of Visits 24    Date for PT Re-Evaluation 10/28/19    Authorization Type Medicare, BCBS as secondary, no auth. VL 75    Authorization - Visit Number 8    Authorization - Number of Visits 75    Progress Note Due on Visit 88    PT Start Time 1130    PT Stop Time 1155   pt requested for session to end early to make it to another apt   PT Time Calculation (min) 25 min    Behavior During Therapy [facility] for tasks assessed/performed           Past Medical History:  Diagnosis Date  . Allergy    food allergies, drug allergies  . Anemia   . Anxiety   . Arthritis   . Cataract   . Family history of breast cancer   . Family history of prostate cancer   . GERD (gastroesophageal reflux disease)   . Hyperlipidemia   . Malignant neoplasm of upper-outer quadrant of left breast in female, estrogen receptor negative ([place]) 06/11/2019   DCIS left breast  . Pre-diabetes    no meds  . Thyroid disease    Grave's    Past  Surgical History:  Procedure Laterality Date  . ABDOMINAL HYSTERECTOMY     bleeding. fibroids  . BREAST LUMPECTOMY WITH RADIOACTIVE SEED LOCALIZATION Left 07/08/2019   Procedure: LEFT BREAST LUMPECTOMY WITH RADIOACTIVE SEED LOCALIZATION;  Surgeon: Jovita Kussmaul, MD;  Location: [facility];  Service: General;  Laterality: Left;  . CESAREAN SECTION    . CHOLECYSTECTOMY    . COLONOSCOPY  2011   [place]: two 3-4 mm sessile polyps, hyperplastic, sigmoid diverticula, TI appeared normal.   . ESOPHAGOGASTRODUODENOSCOPY  2011   [place]: non-bleeding erosive gastropathy, normal duodenum. path: unremarkable duodenum, negative H.pylori, minimal esophagitis   . TONSILLECTOMY  1975    There were no vitals filed for this visit.   Subjective Assessment - 10/30/19 1135    Subjective Patient reports she is feeling good. States she walked more then she had been walking. States she walked about 6000 steps yesterday whereas before 07-3998 steps a day. Current inside knee pain not currently painful but it was bothersome yesterday. States shin pain is better but still there.    Pertinent History breast cancer (last treatment this month)    Limitations Standing;Walking    Currently in Pain? Yes    Pain Score 3     Pain Location Knee    Pain Orientation Right;Anterior  Pain Onset More than a month ago              [facility] PT Assessment - 10/30/19 0001      Assessment   Medical Diagnosis right knee pain    Referring Provider (PT) [facility] PT Treatment/Exercise - 10/30/19 0001      Exercises   Exercises Shoulder      Shoulder Exercises: Supine   Other Supine Exercises scapular retraction x10 5" holds       Shoulder Exercises: Standing   External Rotation AROM;Both   attempted a tvarious degress of shoulder abd - painful    Flexion 5 reps;AROM;Strengthening;Both   with dowel and breath - standing --> supine x10 pain B    Extension AROM;Strengthening;Both;5 reps   5" holds 4 sets                 PT Education - 10/30/19 1201    Education Details educated patient on posture and fascial lines and how tension/tightness in one area (left shoulder) could be causing pain with right PF due to fascial restrictions    Person(s) Educated Patient    Methods Explanation    Comprehension Verbalized understanding            PT Short Term Goals - 10/28/19 1315      PT SHORT TERM GOAL #1   Title Patient will be independent in self management strategies to improve quality of life and functional outcomes.    Time 3    Period Weeks    Status On-going    Target Date 10/07/19      PT SHORT TERM GOAL #2   Title Patient will report at least 25% improvement in overall symptoms and/or functional ability.    Baseline 75% better    Time 3    Period Weeks    Status Achieved    Target Date 10/07/19      PT SHORT TERM GOAL #3   Title Patient will report not waking up in the middle of the night secondary to pain to improve overall sleep quality.    Baseline no longer waking up due to leg pain    Time 3    Period Weeks    Status Achieved    Target Date 10/07/19             PT Long Term Goals - 10/28/19 1315      PT LONG TERM GOAL #1   Title Patient will be able to drive her car without severe pain to demonstrate improved functional mobility or right lower extremity.    Baseline no severe pain with driving    Time 6    Period Weeks    Status Achieved      PT LONG TERM GOAL #2   Title Patient will score with < 30% limitation on FOTO to demonstrate improved functional mobility and tolerance.    Time 6    Period Weeks    Status On-going      PT LONG TERM GOAL #3   Title Patient will report at least 50% improvement in overall symptoms and/or functional ability.    Baseline 75% better    Time 6    Period Weeks    Status Achieved      PT LONG TERM GOAL #4   Title Patient will  be able to ambulate at  least 15 minutes without severe pain to improve walking mobility.    Time 6    Period Weeks    Status New    Target Date 12/09/19                 Plan - 10/30/19 1204    Clinical Impression Statement Session focused on educating patient and working on posture exercises as well as stretching left UE and fascia to help decrease restrictions contributing to pain in right shin with PF. Shoulder flexion and external rotation too painful at this time (reports pain in shoulders and down across back). Reduced symptoms noted with posture exercises. Will follow up with pain and tolerance to new exercises next session.    Personal Factors and Comorbidities Comorbidity 2;Comorbidity 1    Comorbidities chronic swelling in bilateral lower extremities, breast cancer    Examination-Activity Limitations Stand;Locomotion Level;Stairs;Sleep    Examination-Participation Restrictions Cleaning;Community Activity;Driving;Meal Prep    Stability/Clinical Decision Making Evolving/Moderate complexity    Rehab Potential Good    PT Frequency 2x / week    PT Duration 6 weeks    PT Treatment/Interventions ADLs/Self Care Home Management;Aquatic Therapy;Biofeedback;Cryotherapy;Electrical Stimulation;Moist Heat;Traction;Balance training;Therapeutic exercise;Therapeutic activities;Functional mobility training;Stair training;Gait training;DME Instruction;Neuromuscular re-education;Cognitive remediation;Patient/family education;Manual techniques;Dry needling;Passive range of motion;Joint Manipulations    PT Next Visit Plan f/u with breath work and add fascial stretches/movements for spiral fasical sling.  tib fib mobilizations, STM, ankle mobility and strengthening    PT Home Exercise Plan 5/4 calf stretch; 5/11 patella mobilization, thomas stretch, SKC, sciatic nerve glide; 6/17 scapular retraction, shoulder extension    Consulted and Agree with Plan of Care Patient           Patient will benefit from skilled  therapeutic intervention in order to improve the following deficits and impairments:  Pain, Difficulty walking, Decreased strength, Decreased activity tolerance, Decreased range of motion, Increased edema, Decreased endurance  Visit Diagnosis: Chronic pain of right knee  Difficulty in walking, not elsewhere classified     Problem List Patient Active Problem List   Diagnosis Date Noted  . Chronic pain of left knee 09/03/2019  . Genetic testing 08/11/2019  . Family history of breast cancer   . Family history of prostate cancer   . 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  . Weight loss, unintentional 03/27/2019  . Hip pain, acute, left 03/27/2019  . 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 Urination pain 01/16/2019  . Vaginal discharge 01/16/2019  . Anxiety 01/12/2019  . Sinusitis 01/06/2019  . Headache in back of head 12/31/2018  . Splinter in skin 12/23/2018  . Insomnia due to stress 12/23/2018  . H/O swallowed foreign body 11/20/2018  . Change in bowel function 04/09/2018  . Breast pain, left 02/25/2018  . Prediabetes 08/30/2017  . Dyspepsia 09/29/2016  . Colon polyps 08/23/2016  . Family history of cancer 08/23/2016  . HLD (hyperlipidemia) 08/23/2016  . Obesity, Class II, BMI 35-39.9, with comorbidity 08/23/2016  . GERD (gastroesophageal reflux disease) 08/14/2016  . Hypothyroid 08/14/2016  . Perimenopausal vasomotor symptoms 02/11/2016  . Postmenopausal atrophic vaginitis 11/09/2015    12:06 PM, 10/30/19 Jerene Pitch, DPT Physical Therapy with [facility]  [phone] office  [facility] [address] [place], [place], [address] Phone: [phone]   Fax:  [phone]  Name: Kara [name] MRN: [id number] Date of Birth: [birth year]

## 2019-11-13 ENCOUNTER — Telehealth: Payer: Self-pay | Admitting: Obstetrics and Gynecology

## 2019-11-13 NOTE — Telephone Encounter (Signed)
Patient has a question about a few things, she was diagnosed with breast cancer and has finished her treatment, and does not know if she needs to schedule an appt with the MD.

## 2019-11-13 NOTE — Telephone Encounter (Signed)
Telephoned patient at home number and advised patient could schedule appointment for physical. Patient voiced understanding and appointment scheduled.

## 2019-11-19 ENCOUNTER — Encounter: Payer: Medicare Other | Admitting: Adult Health

## 2019-11-25 NOTE — Progress Notes (Signed)
SURVIVORSHIP  VISIT:   BRIEF ONCOLOGIC HISTORY:  Oncology History  Ductal carcinoma in situ (DCIS) of left breast  06/04/2019 Cancer Staging   Staging form: Breast, AJCC 8th Edition - Clinical stage from 06/04/2019: Stage 0 (cTis (DCIS), cN0, cM0, ER-, PR-)   06/11/2019 Initial Diagnosis   Screening mammogram showed left breast calcifications. Diagnostic mammogram showed left breast calcifications spanning 1.1cm. Biopsy showed DCIS with apocrine features, calcifications, and necrosis, high grade, ER/PR negative.   07/08/2019 Surgery   Left lumpectomy Marlou Starks) [phone]): high grade DCIS, 1.5cm, clear margins.   07/08/2019 Cancer Staging   Staging form: Breast, AJCC 8th Edition - Pathologic stage from 07/08/2019: Stage 0 (pTis (DCIS), pN0, cM0, ER-, PR-)   08/09/2019 Genetic Testing   Negative genetic testing on the [facility] 9-gene STAT panel.  The STAT Breast cancer panel offered by [facility] includes sequencing and rearrangement analysis for the following 9 genes:  ATM, BRCA1, BRCA2, CDH1, CHEK2, PALB2, PTEN, STK11 and TP53.   The report date is August 09, 2019.   08/11/2019 - 09/01/2019 Radiation Therapy   The patient initially received a dose of 42.72 Gy in 16 fractions to the breast using whole-breast tangent fields. This was delivered using a 3-D conformal technique. No boost. The total dose was 42.72 Gy.     INTERVAL HISTORY:  [name] to review her survivorship care plan detailing her treatment course for breast cancer, as well as monitoring long-term side effects of that treatment, education regarding health maintenance, screening, and overall wellness and health promotion.     Overall, [name] is doing moderately well.  I unfortunately had some unforseen clinic delays and [name] had to wait longer than usual in the office.  This made her upset, because the cancer center makes her anxious and she dreads coming here.    [name] completed the survivorship survey.  She screened  positive for distress, fatigue--notes taking a multivitamin helps, some mild swelling under her arm, hair thinning, 1-2 hot flashes per day, and some intermittent breast pain, sometimes her hot flashes keep her up.  She is exercising, and has concerns about her weight as she has gained weight since her diagnosis.  She is a previous smoker and quit many years ago.  REVIEW OF SYSTEMS:  Review of Systems  Constitutional: Positive for fatigue. Negative for appetite change, chills, fever and unexpected weight change.  HENT:   Negative for hearing loss, lump/mass and trouble swallowing.   Eyes: Negative for eye problems and icterus.  Respiratory: Negative for chest tightness, cough and shortness of breath.   Cardiovascular: Negative for chest pain, leg swelling and palpitations.  Gastrointestinal: Negative for abdominal distention, constipation, diarrhea and nausea.  Endocrine: Positive for hot flashes.  Genitourinary: Negative for difficulty urinating.   Musculoskeletal: Negative for arthralgias.  Neurological: Negative for dizziness and headaches.  Hematological: Negative for adenopathy. Does not bruise/bleed easily.  Psychiatric/Behavioral: Positive for sleep disturbance. The patient is nervous/anxious.   Breast: Denies any new nodularity, masses, tenderness, nipple changes, or nipple discharge.      ONCOLOGY TREATMENT TEAM:  1. Surgeon:  Dr. Marlou Starks at [facility] 2. Medical Oncologist: Dr. Lindi Adie  3. Radiation Oncologist: Dr. Sondra Come    PAST MEDICAL/SURGICAL HISTORY:  Past Medical History:  Diagnosis Date  . Allergy    food allergies, drug allergies  . Anemia   . Anxiety   . Arthritis   . Cataract   . Family history of breast cancer   . Family history of prostate  cancer   . GERD (gastroesophageal reflux disease)   . Hyperlipidemia   . Malignant neoplasm of upper-outer quadrant of left breast in female, estrogen receptor negative (Kara [name]) 06/11/2019   DCIS left breast  .  Pre-diabetes    no meds  . Thyroid disease    Grave's   Past Surgical History:  Procedure Laterality Date  . ABDOMINAL HYSTERECTOMY     bleeding. fibroids  . BREAST LUMPECTOMY WITH RADIOACTIVE SEED LOCALIZATION Left 07/08/2019   Procedure: LEFT BREAST LUMPECTOMY WITH RADIOACTIVE SEED LOCALIZATION;  Surgeon: Jovita Kussmaul, MD;  Location: [facility];  Service: General;  Laterality: Left;  . CESAREAN SECTION    . CHOLECYSTECTOMY    . COLONOSCOPY  2011   [place]: two 3-4 mm sessile polyps, hyperplastic, sigmoid diverticula, TI appeared normal.   . ESOPHAGOGASTRODUODENOSCOPY  2011   [place]: non-bleeding erosive gastropathy, normal duodenum. path: unremarkable duodenum, negative H.pylori, minimal esophagitis   . TONSILLECTOMY  1975     ALLERGIES:  Allergies  Allergen Reactions  . Prednisone Hypertension    Severe headache  . Other     Allergic to seafood and nuts  Patient tested and not allergic to the above  . Naproxen Other (See Comments)    headache     CURRENT MEDICATIONS:  Outpatient Encounter Medications as of 11/26/2019  Medication Sig Note  . acetaminophen (TYLENOL) 500 MG tablet Take 500 mg by mouth every 6 (six) hours as needed.   . Ascorbic Acid (VITAMIN C WITH ROSE HIPS) 500 MG tablet Take 500 mg by mouth daily.   . Blood Pressure Monitoring (B-D ASSURE BPM/AUTO ARM CUFF) MISC Check your BP once daily   . cholecalciferol (VITAMIN D3) 25 MCG (1000 UNIT) tablet Take 1,000 Units by mouth daily.   Marland Kitchen EPINEPHrine 0.3 mg/0.3 mL IJ SOAJ injection Inject 0.3 mLs (0.3 mg total) into the skin as needed.   . famotidine (PEPCID) 20 MG tablet Take 20 mg by mouth 2 (two) times daily as needed.    Marland Kitchen levocetirizine (XYZAL) 5 MG tablet Take 2.5 mg by mouth every evening.  09/03/2019: PRN  . ONETOUCH VERIO test strip USE TO CHECK BLOOD SUGAR TWICE DAILY   . SYNTHROID 112 MCG tablet Take 1 tablet (112 mcg total) by mouth daily.    No facility-administered encounter  medications on file as of 11/26/2019.     ONCOLOGIC FAMILY HISTORY:  Family History  Problem Relation Age of Onset  . Breast cancer Maternal Grandmother        dx over 22  . Prostate cancer Maternal Grandfather   . Arthritis Mother   . COPD Mother   . Breast cancer Mother 80       second at age 68  . Cancer Father        throat  . Stroke Father   . Cancer Maternal Aunt [address] Lacks, cervical cancer  . Breast cancer Maternal Aunt        dx over 61  . Breast cancer Paternal Aunt        dx under 8  . Prostate cancer Maternal Uncle        dx over 36  . Cancer Other        father's maternal cousin was Henreietta Lacks, Cervical  . Cancer Maternal Uncle        unknown cancer  . Breast cancer Paternal Aunt        dx over 80  .  Colon cancer Neg Hx   . Allergic rhinitis Neg Hx   . Angioedema Neg Hx   . Asthma Neg Hx   . Atopy Neg Hx   . Eczema Neg Hx   . Urticaria Neg Hx   . Immunodeficiency Neg Hx      GENETIC COUNSELING/TESTING: See above  SOCIAL HISTORY:  Social History   Socioeconomic History  . Marital status: Divorced    Spouse name: Not on file  . Number of children: 2  . Years of education: 69  . Highest education level: Not on file  Occupational History  . 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 Smoking status: Former Smoker    Quit date: 06/15/1998    Years since quitting: 21.4  . Smokeless tobacco: Never Used  . Tobacco comment: quit 2002  Vaping Use  . Vaping Use: Never used  Substance and Sexual Activity  . Alcohol use: No  . Drug use: No  . Sexual activity: Not Currently    Birth control/protection: Surgical    Comment: hyst  Other Topics Concern  . 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 Difficulty of Paying Living Expenses:   Food Insecurity:   . Worried About Charity fundraiser in the Last Year:   . Arboriculturist in the Last Year:   Transportation Needs:   . Film/video editor (Medical):   Marland Kitchen Lack of Transportation (Non-Medical):   Physical Activity:   . Days of Exercise per Week:   . Minutes of Exercise per Session:   Stress:   . Feeling of Stress :   Social Connections:   . Frequency of Communication with Friends and Family:   . Frequency of Social Gatherings with Friends and Family:   . Attends Religious Services:   . Active Member of Clubs or Organizations:   . Attends Archivist Meetings:   Marland Kitchen Marital Status:   Intimate Partner Violence:   . Fear of Current or Ex-Partner:   . Emotionally Abused:   Marland Kitchen Physically Abused:   . Sexually Abused:      OBSERVATIONS/OBJECTIVE:  BP (!) 111/57 (BP Location: Left Arm, Patient Position: Sitting)   Pulse 88   Temp 98.5 F (36.9 C) (Temporal)   Resp 17   Ht _0  (1.676 m)   Wt 216 lb 11.2 oz (98.3 kg)   SpO2 100%   BMI 34.98 kg/m  GENERAL: Patient is a well appearing female in no acute distress HEENT:  Sclerae anicteric.  Oropharynx clear and moist. No ulcerations or evidence of oropharyngeal candidiasis. Neck is supple.  NODES:  No cervical, supraclavicular, or axillary lymphadenopathy palpated.  BREAST EXAM:  Deferred. LUNGS:  Clear to auscultation bilaterally.  No wheezes or rhonchi. HEART:  Regular rate and rhythm. No murmur appreciated. ABDOMEN:  Soft, nontender.  Positive, normoactive bowel sounds. No organomegaly palpated. MSK:  No focal spinal tenderness to palpation. Full range of motion bilaterally in the upper extremities. EXTREMITIES:  No peripheral edema.   SKIN:  Clear with no obvious rashes or skin changes. No nail dyscrasia. NEURO:  Nonfocal. Well oriented.  Appropriate affect.    LABORATORY DATA:  None  for this visit.  DIAGNOSTIC IMAGING:  None for this visit.      ASSESSMENT AND PLAN:  Ms.. Kara [name] is a pleasant 74 y.o. female with Stage 0 left breast DCIS, ER-/PR-, diagnosed in 05/2019, treated with lumpectomy, adjuvant radiation therapy.  She presents to the Survivorship Clinic for our initial meeting and routine follow-up post-completion of treatment for breast cancer.    1. Stage 0 left breast cancer:  Ms. Toor is continuing to recover from definitive treatment for breast cancer. She will follow-up with her medical oncologist, Dr. Lindi Adie in 6 months after her mammogram with history and physical exam per surveillance protocol.  Her mammogram is due 05/2020; orders placed today. Today, a comprehensive survivorship care plan and treatment summary was reviewed with the patient today detailing her breast cancer diagnosis, treatment course, potential late/long-term effects of treatment, appropriate follow-up care with recommendations for the future, and patient education resources.  A copy of this summary, along with a letter will be sent to the patient's primary care provider via mail/fax/In Basket message after today's visit.    2. Positive distress screen: I placed a referral to social work.  She has a hard time coming to the cancer center, so our social worker will call her.    3.  Breast pain/swelling: I recommended small massages on her breast with coconut oil or vitamin e oil.  If persistent and doesn't improve she will call me and I will then refer to physical therapy.  4. Bone health:   She was given education on specific activities to promote bone health.  5. Cancer screening:  Due to Ms. Laski's history and her age, she should receive screening for skin cancers, colon cancer, and gynecologic cancers.  The information and recommendations are listed on the patient's comprehensive care plan/treatment summary and were reviewed in detail with the patient.    6. Health maintenance and wellness  promotion: Ms. Campo was encouraged to consume 5-7 servings of fruits and vegetables per day. We reviewed the "Nutrition Rainbow" handout, as well as the handout "Take Control of Your Health and Reduce Your Cancer Risk" from the [facility].  She was also encouraged to engage in moderate to vigorous exercise for 30 minutes per day most days of the week. We discussed the [employer] fitness program, which is designed for cancer survivors to help them become more physically fit after cancer treatments.  She was instructed to limit her alcohol consumption and continue to abstain from tobacco use.     7. Support services/counseling: It is not uncommon for this period of the patient's cancer care trajectory to be one of many emotions and stressors.  We discussed how this can be increasingly difficult during the times of quarantine and social distancing due to the COVID-19 pandemic.   She was given information regarding our available services and encouraged to contact me with any questions or for help enrolling in any of our support group/programs.    Follow up instructions:    -Return to cancer center in 6 months for f/u with Dr. Lindi Adie  -Mammogram due in 05/2020 -Follow up with surgery in 11/2020 -She is welcome to return back to the Survivorship Clinic at any time; no additional follow-up needed at this time.  -Consider referral back to survivorship as a long-term survivor for continued surveillance  The patient was provided an opportunity to ask questions and all were answered.  The patient agreed with the plan and demonstrated an understanding of the instructions.   Total encounter time: 45 minutes*  Wilber Bihari, NP 11/25/19 7:25 PM Medical Oncology and Hematology [facility] [place], [place] [address] Tel. [phone]    Fax. [phone]  *Total Encounter Time as defined by the Centers for Medicare and Medicaid Services includes, in addition to the  face-to-face time of a patient visit (documented in the note above) non-face-to-face time: obtaining and reviewing outside history, ordering and reviewing medications, tests or procedures, care coordination (communications with other health care professionals or caregivers) and documentation in the medical record.

## 2019-11-26 ENCOUNTER — Other Ambulatory Visit: Payer: Self-pay

## 2019-11-26 ENCOUNTER — Inpatient Hospital Stay: Payer: Medicare Other | Attending: Adult Health | Admitting: Adult Health

## 2019-11-26 VITALS — BP 111/57 | HR 88 | Temp 98.5°F | Resp 17 | Ht 66.0 in | Wt 216.7 lb

## 2019-11-26 DIAGNOSIS — R232 Flushing: Secondary | ICD-10-CM | POA: Insufficient documentation

## 2019-11-26 DIAGNOSIS — Z836 Family history of other diseases of the respiratory system: Secondary | ICD-10-CM | POA: Insufficient documentation

## 2019-11-26 DIAGNOSIS — Z79899 Other long term (current) drug therapy: Secondary | ICD-10-CM | POA: Insufficient documentation

## 2019-11-26 DIAGNOSIS — Z803 Family history of malignant neoplasm of breast: Secondary | ICD-10-CM | POA: Diagnosis not present

## 2019-11-26 DIAGNOSIS — D0512 Intraductal carcinoma in situ of left breast: Secondary | ICD-10-CM | POA: Insufficient documentation

## 2019-11-26 DIAGNOSIS — Z888 Allergy status to other drugs, medicaments and biological substances status: Secondary | ICD-10-CM | POA: Diagnosis not present

## 2019-11-26 DIAGNOSIS — Z8261 Family history of arthritis: Secondary | ICD-10-CM | POA: Insufficient documentation

## 2019-11-26 DIAGNOSIS — Z8042 Family history of malignant neoplasm of prostate: Secondary | ICD-10-CM | POA: Insufficient documentation

## 2019-11-26 DIAGNOSIS — Z8049 Family history of malignant neoplasm of other genital organs: Secondary | ICD-10-CM | POA: Diagnosis not present

## 2019-11-26 DIAGNOSIS — Z809 Family history of malignant neoplasm, unspecified: Secondary | ICD-10-CM | POA: Insufficient documentation

## 2019-11-26 DIAGNOSIS — Z823 Family history of stroke: Secondary | ICD-10-CM | POA: Insufficient documentation

## 2019-11-26 DIAGNOSIS — R5383 Other fatigue: Secondary | ICD-10-CM | POA: Insufficient documentation

## 2019-11-26 DIAGNOSIS — Z87891 Personal history of nicotine dependence: Secondary | ICD-10-CM | POA: Diagnosis not present

## 2019-11-26 DIAGNOSIS — E785 Hyperlipidemia, unspecified: Secondary | ICD-10-CM | POA: Diagnosis not present

## 2019-11-26 DIAGNOSIS — Z171 Estrogen receptor negative status [ER-]: Secondary | ICD-10-CM | POA: Diagnosis not present

## 2019-11-26 DIAGNOSIS — Z8719 Personal history of other diseases of the digestive system: Secondary | ICD-10-CM | POA: Diagnosis not present

## 2019-11-26 DIAGNOSIS — Z886 Allergy status to analgesic agent status: Secondary | ICD-10-CM | POA: Insufficient documentation

## 2019-11-26 DIAGNOSIS — E890 Postprocedural hypothyroidism: Secondary | ICD-10-CM | POA: Insufficient documentation

## 2019-11-26 DIAGNOSIS — Z86010 Personal history of colonic polyps: Secondary | ICD-10-CM | POA: Insufficient documentation

## 2019-11-28 ENCOUNTER — Telehealth: Payer: Self-pay | Admitting: Licensed Clinical Social Worker

## 2019-11-28 NOTE — Telephone Encounter (Signed)
[place] Work  Clinical Social Work was referred by NP, Wilber Bihari for concern for distress during survivorship care plan visit.  Clinical Social Worker attempted to contact patient by phone  to offer support and assess for needs.  No answer. Left generic VM with direct contact information.    Valli Randol, [place], [place] Worker [facility]

## 2019-12-04 ENCOUNTER — Other Ambulatory Visit: Payer: Self-pay | Admitting: "Endocrinology

## 2019-12-04 ENCOUNTER — Ambulatory Visit (INDEPENDENT_AMBULATORY_CARE_PROVIDER_SITE_OTHER): Payer: Medicare Other | Admitting: Obstetrics and Gynecology

## 2019-12-04 ENCOUNTER — Encounter: Payer: Self-pay | Admitting: Obstetrics and Gynecology

## 2019-12-04 VITALS — BP 92/56 | HR 74 | Ht 66.0 in | Wt 217.2 lb

## 2019-12-04 DIAGNOSIS — Z853 Personal history of malignant neoplasm of breast: Secondary | ICD-10-CM | POA: Diagnosis not present

## 2019-12-04 DIAGNOSIS — Z1212 Encounter for screening for malignant neoplasm of rectum: Secondary | ICD-10-CM | POA: Diagnosis not present

## 2019-12-04 DIAGNOSIS — Z1211 Encounter for screening for malignant neoplasm of colon: Secondary | ICD-10-CM

## 2019-12-04 LAB — HEMOCCULT GUIAC POC 1CARD (OFFICE): Fecal Occult Blood, POC: NEGATIVE

## 2019-12-04 NOTE — Progress Notes (Signed)
Patient ID: Kara [name], female   DOB: Dec 14, 1945, 74 y.o.   MRN: [id number]   Assessment:  1.   Annual Gyn Exam 2.   History of high grade DCIS, ER/PR-, s/p left lumpectomy and radiation therapy 3.   Urge incontinence Plan:  1. Pap smear not done 2. Return prn 3    Annual mammogram advised Subjective:  Kara [name] is a 74 y.o. female G3P0010 who presents for annual exam. No LMP recorded. Patient has had a hysterectomy. The patient is here for a physical after treatment for her breast cancer. Diagnostic mammogram on 05/27/2019 revealed indeterminate left breast calcifications. She was diagnosed with high grade DCIS, ER/PR negative. Genetic testing revealed no pathogenic mutations. She underwent left lumpectomy on 07/08/2019 and radiation therapy from 08/11/2019-09/01/2019. Her next mammogram is due on 05/2020.  Today, she notes that her breast is still sore from radiation. She is not sexually active at this time. She had a total hysterectomy. Reports some urge incontinence.  The following portions of the patient's history were reviewed and updated as appropriate: allergies, current medications, past family history, past medical history, past social history, past surgical history and problem list. Past Medical History:  Diagnosis Date  . Allergy    food allergies, drug allergies  . Anemia   . Anxiety   . Arthritis   . Cataract   . Family history of breast cancer   . Family history of prostate cancer   . GERD (gastroesophageal reflux disease)   . Hyperlipidemia   . Malignant neoplasm of upper-outer quadrant of left breast in female, estrogen receptor negative ([place]) 06/11/2019   DCIS left breast  . Pre-diabetes    no meds  . Thyroid disease    Grave's    Past Surgical History:  Procedure Laterality Date  . ABDOMINAL HYSTERECTOMY     bleeding. fibroids  . BREAST LUMPECTOMY WITH RADIOACTIVE SEED LOCALIZATION Left 07/08/2019   Procedure: LEFT BREAST LUMPECTOMY WITH RADIOACTIVE  SEED LOCALIZATION;  Surgeon: Jovita Kussmaul, MD;  Location: [facility];  Service: General;  Laterality: Left;  . CESAREAN SECTION    . CHOLECYSTECTOMY    . COLONOSCOPY  2011   [place]: two 3-4 mm sessile polyps, hyperplastic, sigmoid diverticula, TI appeared normal.   . ESOPHAGOGASTRODUODENOSCOPY  2011   [place]: non-bleeding erosive gastropathy, normal duodenum. path: unremarkable duodenum, negative H.pylori, minimal esophagitis   . TONSILLECTOMY  1975     Current Outpatient Medications:  .  acetaminophen (TYLENOL) 500 MG tablet, Take 500 mg by mouth every 6 (six) hours as needed., Disp: , Rfl:  .  Ascorbic Acid (VITAMIN C WITH ROSE HIPS) 500 MG tablet, Take 500 mg by mouth daily., Disp: , Rfl:  .  Blood Pressure Monitoring (B-D ASSURE BPM/AUTO ARM CUFF) MISC, Check your BP once daily, Disp: 1 each, Rfl: 0 .  cholecalciferol (VITAMIN D3) 25 MCG (1000 UNIT) tablet, Take 1,000 Units by mouth daily., Disp: , Rfl:  .  EPINEPHrine 0.3 mg/0.3 mL IJ SOAJ injection, Inject 0.3 mLs (0.3 mg total) into the skin as needed., Disp: 2 each, Rfl: 2 .  famotidine (PEPCID) 20 MG tablet, Take 20 mg by mouth 2 (two) times daily as needed. , Disp: , Rfl:  .  SYNTHROID 112 MCG tablet, TAKE 1 TABLET(112 MCG) BY MOUTH DAILY, Disp: 90 tablet, Rfl: 1  Review of Systems Constitutional: negative Gastrointestinal: negative Genitourinary: some urge incontinence  Objective:  BP (!) 92/56 (BP Location: Right Arm, Patient Position: Sitting, Cuff Size:  Large)   Pulse 74   Ht 5\' 6"  (1.676 m)   Wt (!) 217 lb 3.2 oz (98.5 kg)   BMI 35.06 kg/m    BMI: Body mass index is 35.06 kg/m.  General Appearance: Alert, appropriate appearance for age. No acute distress HEENT: Grossly normal Neck / Thyroid:  Cardiovascular: RRR; normal S1, S2, no murmur Lungs: CTA bilaterally Back: No CVAT Breast Exam: deferred Gastrointestinal: Soft, non-tender, no masses or organomegaly Pelvic Exam: Vagina: decent  support Rectovaginal: guaiac negative stool obtained Lymphatic Exam: Non-palpable nodes in neck, clavicular, axillary, or inguinal regions  Skin: no rash or abnormalities Neurologic: Normal gait and speech, no tremor  Psychiatric: Alert and oriented, appropriate affect.  Urinalysis:Not done  Mallory Shirk. MD Pgr [phone] 10:32 AM  By signing my name below, I, De Burrs, attest that this documentation has been prepared under the direction and in the presence of Jonnie Kind, MD. Electronically Signed: De Burrs, Medical Scribe. 12/04/19. 10:32 AM.  I personally performed the services described in this documentation, which was SCRIBED in my presence. The recorded information has been reviewed and considered accurate. It has been edited as necessary during review. Jonnie Kind, MD

## 2019-12-05 DIAGNOSIS — H40021 Open angle with borderline findings, high risk, right eye: Secondary | ICD-10-CM | POA: Diagnosis not present

## 2019-12-05 DIAGNOSIS — H401221 Low-tension glaucoma, left eye, mild stage: Secondary | ICD-10-CM | POA: Diagnosis not present

## 2019-12-05 DIAGNOSIS — H04123 Dry eye syndrome of bilateral lacrimal glands: Secondary | ICD-10-CM | POA: Diagnosis not present

## 2019-12-17 DIAGNOSIS — Z20828 Contact with and (suspected) exposure to other viral communicable diseases: Secondary | ICD-10-CM | POA: Diagnosis not present

## 2019-12-22 ENCOUNTER — Ambulatory Visit
Admission: EM | Admit: 2019-12-22 | Discharge: 2019-12-22 | Disposition: A | Discharge status: Home / Self Care | Payer: Medicare Other | Attending: Emergency Medicine | Admitting: Emergency Medicine

## 2019-12-22 ENCOUNTER — Telehealth (INDEPENDENT_AMBULATORY_CARE_PROVIDER_SITE_OTHER): Payer: Self-pay | Admitting: Internal Medicine

## 2019-12-22 DIAGNOSIS — R300 Dysuria: Secondary | ICD-10-CM

## 2019-12-22 LAB — POCT URINALYSIS DIP (MANUAL ENTRY)
Bilirubin, UA: NEGATIVE
Blood, UA: NEGATIVE
Glucose, UA: NEGATIVE mg/dL
Ketones, POC UA: NEGATIVE mg/dL
Leukocytes, UA: NEGATIVE
Nitrite, UA: NEGATIVE
Protein Ur, POC: NEGATIVE mg/dL
Spec Grav, UA: 1.025 (ref 1.010–1.025)
Urobilinogen, UA: 0.2 E.U./dL
pH, UA: 5 (ref 5.0–8.0)

## 2019-12-22 NOTE — ED Triage Notes (Signed)
Pt presents with dysuria for past 3 days

## 2019-12-22 NOTE — Discharge Instructions (Addendum)
Urine analysis is negative for UTI . urine culture sent.  We will call you with the results.   Push fluids and get plenty of rest.   Take Azo as needed for symptomatic relief Take OTC Tylenol as needed for back pain Follow up with PCP if symptoms persists Return here or go to ER if you have any new or worsening symptoms such as fever, worsening abdominal pain, nausea/vomiting, flank pain, etc..Marland Kitchen

## 2019-12-22 NOTE — ED Provider Notes (Signed)
[facility]   Chief Complaint  Patient presents with  . Dysuria     SUBJECTIVE:  Kara [name] is a 74 y.o. female who complains  dysuria for the past 3 days.  Patient denies a precipitating event, recent sexual encounter, excessive caffeine intake.  Localizes the pain to the lower abdomen/ flank.  Pain is intermittentand describes it as achy.  Has tried OTC medications without relief.  Symptoms are made worse with urination.  Admits to similar symptoms in the past.  Denies fever, chills, nausea, vomiting, abdominal pain, flank pain, abnormal vaginal discharge or bleeding, hematuria.    LMP: No LMP recorded. Patient has had a hysterectomy.  ROS: As in HPI.  All other pertinent ROS negative.     Past Medical History:  Diagnosis Date  . Allergy    food allergies, drug allergies  . Anemia   . Anxiety   . Arthritis   . Cataract   . Family history of breast cancer   . Family history of prostate cancer   . GERD (gastroesophageal reflux disease)   . Hyperlipidemia   . Malignant neoplasm of upper-outer quadrant of left breast in female, estrogen receptor negative ([place]) 06/11/2019   DCIS left breast  . Pre-diabetes    no meds  . Thyroid disease    Grave's   Past Surgical History:  Procedure Laterality Date  . ABDOMINAL HYSTERECTOMY     bleeding. fibroids  . BREAST LUMPECTOMY WITH RADIOACTIVE SEED LOCALIZATION Left 07/08/2019   Procedure: LEFT BREAST LUMPECTOMY WITH RADIOACTIVE SEED LOCALIZATION;  Surgeon: Jovita Kussmaul, MD;  Location: [place];  Service: General;  Laterality: Left;  . CESAREAN SECTION    . CHOLECYSTECTOMY    . COLONOSCOPY  2011   [place]: two 3-4 mm sessile polyps, hyperplastic, sigmoid diverticula, TI appeared normal.   . ESOPHAGOGASTRODUODENOSCOPY  2011   [place]: non-bleeding erosive gastropathy, normal duodenum. path: unremarkable duodenum, negative H.pylori, minimal esophagitis   . TONSILLECTOMY  1975   Allergies  Allergen  Reactions  . Prednisone Hypertension    Severe headache  . Other     Allergic to seafood and nuts  Patient tested and not allergic to the above  . Naproxen Other (See Comments)    headache   No current facility-administered medications on file prior to encounter.   Current Outpatient Medications on File Prior to Encounter  Medication Sig Dispense Refill  . acetaminophen (TYLENOL) 500 MG tablet Take 500 mg by mouth every 6 (six) hours as needed.    . Ascorbic Acid (VITAMIN C WITH ROSE HIPS) 500 MG tablet Take 500 mg by mouth daily.    . Blood Pressure Monitoring (B-D ASSURE BPM/AUTO ARM CUFF) MISC Check your BP once daily 1 each 0  . cholecalciferol (VITAMIN D3) 25 MCG (1000 UNIT) tablet Take 1,000 Units by mouth daily.    Marland Kitchen EPINEPHrine 0.3 mg/0.3 mL IJ SOAJ injection Inject 0.3 mLs (0.3 mg total) into the skin as needed. 2 each 2  . famotidine (PEPCID) 20 MG tablet Take 20 mg by mouth 2 (two) times daily as needed.     . Multiple Vitamin (MULTIVITAMIN) tablet Take 1 tablet by mouth daily.    Marland Kitchen SYNTHROID 112 MCG tablet TAKE 1 TABLET(112 MCG) BY MOUTH DAILY 90 tablet 1   Social History   Socioeconomic History  . Marital status: Divorced    Spouse name: Not on file  . Number of children: 2  . Years of education: 31  .  Highest education level: Not on file  Occupational History  . 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 Smoking status: Former Smoker    Quit date: 06/15/1998    Years since quitting: 21.5  . Smokeless tobacco: Never Used  . Tobacco comment: quit 2002  Vaping Use  . Vaping Use: Never used  Substance and Sexual Activity  . Alcohol use: No  . Drug use: No  . Sexual activity: Not Currently    Birth control/protection: Surgical    Comment: hyst  Other Topics Concern  . 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 Difficulty of Paying Living Expenses: Patient refused  Food Insecurity: No Food Insecurity  . Worried About Charity fundraiser in the Last Year: Never true  . Ran Out of Food in the Last Year: Never true  Transportation Needs: No Transportation Needs  . Lack of Transportation (Medical): No  . Lack of Transportation (Non-Medical): No  Physical Activity: Sufficiently Active  . Days of Exercise per Week: 5 days  . Minutes of Exercise per Session: 30 min  Stress: No Stress Concern Present  . Feeling of Stress : Only a little  Social Connections: Unknown  . Frequency of Communication with Friends and Family: More than three times a week  . Frequency of Social Gatherings with Friends and Family: More than three times a week  . Attends Religious Services: Patient refused  . Active Member of Clubs or Organizations: Patient refused  . Attends Archivist Meetings: Patient refused  . Marital Status: Patient refused  Intimate Partner Violence: Not At Risk  . Fear of Current or Ex-Partner: No  . Emotionally Abused: No  . Physically Abused: No  . Sexually Abused: No   Family History  Problem Relation Age of Onset  . Breast cancer Maternal Grandmother        dx over 83  . Prostate cancer Maternal Grandfather   . Arthritis Mother   . COPD Mother   . Breast cancer Mother 87       second at age 75  . Cancer Father        throat  . Stroke Father   . Cancer Maternal Aunt [address] Lacks, cervical cancer  . Breast cancer Maternal Aunt        dx over 35  . Breast cancer Paternal Aunt        dx under 74  . Prostate cancer Maternal Uncle        dx over 27  . Cancer Other        father's maternal cousin was Henreietta Lacks, Cervical  . Cancer Maternal Uncle        unknown cancer  . Breast cancer Paternal Aunt         dx over 42  . Colon cancer Neg Hx   . Allergic rhinitis Neg Hx   . Angioedema Neg Hx   . Asthma Neg Hx   . Atopy Neg Hx   . Eczema Neg Hx   . Urticaria Neg Hx   . Immunodeficiency  Neg Hx     OBJECTIVE:  Vitals:   12/22/19 1027  BP: 112/76  Pulse: 71  Resp: 16  Temp: 98.3 F (36.8 C)  TempSrc: Oral  SpO2: 98%   General appearance: AOx3 in no acute distress HEENT: NCAT.  Oropharynx clear.  Lungs: clear to auscultation bilaterally without adventitious breath sounds Heart: regular rate and rhythm.  Radial pulses 2+ symmetrical bilaterally Abdomen: soft; non-distended; no tenderness; bowel sounds present; no guarding or rebound tenderness Back: no CVA tenderness, tenderness lower back Extremities: no edema; symmetrical with no gross deformities Skin: warm and dry Neurologic: Ambulates from chair to exam table without difficulty Psychological: alert and cooperative; normal mood and affect  Labs Reviewed  URINE CULTURE  POCT URINALYSIS DIP (MANUAL ENTRY)    ASSESSMENT & PLAN:  1. Dysuria     No orders of the defined types were placed in this encounter.  Discharge Instructions Urine analysis is negative for UTI . urine culture sent.  We will call you with the results.   Push fluids and get plenty of rest.   Take Azo as needed for symptomatic relief Take OTC Tylenol as needed for back pain Follow up with PCP if symptoms persists Return here or go to ER if you have any new or worsening symptoms such as fever, worsening abdominal pain, nausea/vomiting, flank pain, etc...  Outlined signs and symptoms indicating need for more acute intervention. Patient verbalized understanding. After Visit Summary given.         Note: This document was prepared using Dragon voice recognition software and may include unintentional dictation errors.    Emerson Monte, FNP 12/22/19 1110

## 2019-12-22 NOTE — Telephone Encounter (Signed)
From the appointment schedule, it looks like she is not going to come to our practice any longer.  In either case, since we are booked up, she can go to an urgent care center.

## 2019-12-23 LAB — URINE CULTURE

## 2019-12-25 ENCOUNTER — Ambulatory Visit (INDEPENDENT_AMBULATORY_CARE_PROVIDER_SITE_OTHER): Payer: Medicare Other | Admitting: Internal Medicine

## 2019-12-30 ENCOUNTER — Ambulatory Visit (INDEPENDENT_AMBULATORY_CARE_PROVIDER_SITE_OTHER): Payer: Medicare Other | Admitting: Family Medicine

## 2019-12-30 ENCOUNTER — Other Ambulatory Visit: Payer: Self-pay

## 2019-12-30 ENCOUNTER — Encounter: Payer: Self-pay | Admitting: Family Medicine

## 2019-12-30 ENCOUNTER — Encounter: Payer: Self-pay | Admitting: Adult Health

## 2019-12-30 VITALS — BP 108/62 | HR 85 | Resp 16 | Ht 66.0 in | Wt 217.1 lb

## 2019-12-30 DIAGNOSIS — K141 Geographic tongue: Secondary | ICD-10-CM | POA: Diagnosis not present

## 2019-12-30 DIAGNOSIS — D0512 Intraductal carcinoma in situ of left breast: Secondary | ICD-10-CM | POA: Diagnosis not present

## 2019-12-30 DIAGNOSIS — E661 Drug-induced obesity: Secondary | ICD-10-CM

## 2019-12-30 DIAGNOSIS — Z6835 Body mass index (BMI) 35.0-35.9, adult: Secondary | ICD-10-CM | POA: Diagnosis not present

## 2019-12-30 DIAGNOSIS — E890 Postprocedural hypothyroidism: Secondary | ICD-10-CM

## 2019-12-30 DIAGNOSIS — R8290 Unspecified abnormal findings in urine: Secondary | ICD-10-CM | POA: Insufficient documentation

## 2019-12-30 DIAGNOSIS — M7062 Trochanteric bursitis, left hip: Secondary | ICD-10-CM | POA: Diagnosis not present

## 2019-12-30 LAB — POCT URINALYSIS DIP (CLINITEK)
Bilirubin, UA: NEGATIVE
Blood, UA: NEGATIVE
Glucose, UA: NEGATIVE mg/dL
Ketones, POC UA: NEGATIVE mg/dL
Leukocytes, UA: NEGATIVE
Nitrite, UA: NEGATIVE
POC PROTEIN,UA: NEGATIVE
Spec Grav, UA: 1.02 (ref 1.010–1.025)
Urobilinogen, UA: 0.2 E.U./dL
pH, UA: 5.5 (ref 5.0–8.0)

## 2019-12-30 NOTE — Assessment & Plan Note (Signed)
Questionable geographic tongue versus irritation of new partial.  Advised to follow-up with dentist.

## 2019-12-30 NOTE — Assessment & Plan Note (Signed)
Followed closely by Dr. Dorris Fetch now.  Reports doing very well on her current dose of medication not having any issues.

## 2019-12-30 NOTE — Assessment & Plan Note (Signed)
Urine did clean as it was previously at urgent care sent out for culture as she did have multiple species present and the other culture sent.

## 2019-12-30 NOTE — Progress Notes (Signed)
Subjective:  Patient ID: Kara [name], female    DOB: 1946/01/15  Age: 74 y.o. MRN: [id number]  CC:  Chief Complaint  Patient presents with  . New Patient (Initial Visit)    establish care, look at tongue  . pressure in lower abdomen    malodorous urine  . Hip Pain    left hip pain when she lays on the hip      HPI  HPI  Kara [name] is a pleasant 74 year old female patient who presents today to establish care.  Previously seen by Dr. Nevada Crane, Dr. Meda Coffee, Dr. Holly Bodily.  She has a medical history that includes but is not limited to recent breast cancer and treatment this year for DCIS left breast, hyperlipidemia, hypothyroidism, anxiety, obesity among others.  She has several concerns to address today which include her tongue being uncomfortable recently had a partial replacement.  Reported that the discomfort in her tongue started happening a day before that.  But she is unsure of what is going on she is tried salt water gargles.  Has not check back in with a dentist at this time.  There is no ulcerations, nothing that she is ate or drink that triggered it.  Additionally she reports some left hip pain that is been off and on recently.  Predominately is happening when she lays on the left side.  She has not been told that she has had bursitis in the past.  However in communication her signs and symptoms are very similar to this.  Additionally she reports that her urine is odorous.  Recently was seen at an urgent care and was told that her culture came back with multiple species and would need to be rechecked at her primary care office.  She reports still having pressure in her abdomen area denies having any pain with urination but does report malodorous urine.  Denies having nausea or vomiting.  She reports that she sleeps fragmented.  She denies have any changes in bowel or bladder habits.  Negative for blood in urine or stool.  Is due for colonoscopy here soon.  We will get referral in for  that.  Denies having any memory issues.  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  She denies having any recent falls or injuries.  She denies having any skin issues however she does report she does wear compression socks bilaterally she does have lymphedema it looks like.  Denies having any hearing issues.  Does wear glasses has dry eyes is being treated by her eye doctor for this.  She has had her Covid vaccines.  Is up-to-date on her mammogram.  Being closely followed by cancer center at least yearly.  Today patient denies signs and symptoms of COVID 19 infection including fever, chills, cough, shortness of breath, and headache. Past Medical, Surgical, Social History, Allergies, and Medications have been Reviewed.   Past Medical History:  Diagnosis Date  . Allergy    food allergies, drug allergies  . Anemia   . Anxiety   . Arthritis   . Breast pain, left 02/25/2018  . Cataract   . Change in bowel function 04/09/2018  . Chronic pain of left knee 09/03/2019  . Dyspepsia 09/29/2016  . Family history of breast cancer   . Family history of cancer 08/23/2016   Aunt is [employer]  . Family history of prostate cancer   . Genetic testing 08/11/2019  Negative genetic testing on the [facility] 9-gene STAT panel.  The STAT Breast cancer panel offered by [facility] includes sequencing and rearrangement analysis for the following 9 genes:  ATM, BRCA1, BRCA2, CDH1, CHEK2, PALB2, PTEN, STK11 and TP53.   The report date is August 09, 2019.  Marland Kitchen GERD (gastroesophageal reflux disease)   . H/O swallowed foreign body 11/20/2018  . Headache in back of head 12/31/2018  . Hip pain, acute, left 03/27/2019  . HLD (hyperlipidemia) 08/23/2016  . Hyperlipidemia   . Hypothyroid 08/14/2016  . Insomnia due to stress 12/23/2018  . Malignant neoplasm of upper-outer quadrant of left breast in female, estrogen receptor negative ([place]) 06/11/2019   DCIS left breast  .  Perimenopausal vasomotor symptoms 02/11/2016  . Postmenopausal atrophic vaginitis 11/09/2015  . Pre-diabetes    no meds  . Prediabetes 08/30/2017  . Thyroid disease    Grave's  . Vaginal discharge 01/16/2019  . Weight loss, unintentional 03/27/2019    Current Meds  Medication Sig  . acetaminophen (TYLENOL) 500 MG tablet Take 500 mg by mouth every 6 (six) hours as needed.  . Ascorbic Acid (VITAMIN C WITH ROSE HIPS) 500 MG tablet Take 500 mg by mouth daily.  . Blood Pressure Monitoring (B-D ASSURE BPM/AUTO ARM CUFF) MISC Check your BP once daily  . cholecalciferol (VITAMIN D3) 25 MCG (1000 UNIT) tablet Take 1,000 Units by mouth daily.  Marland Kitchen EPINEPHrine 0.3 mg/0.3 mL IJ SOAJ injection Inject 0.3 mLs (0.3 mg total) into the skin as needed.  . famotidine (PEPCID) 20 MG tablet Take 20 mg by mouth 2 (two) times daily as needed.   . Multiple Vitamin (MULTIVITAMIN) tablet Take 1 tablet by mouth daily.  Marland Kitchen SYNTHROID 112 MCG tablet TAKE 1 TABLET(112 MCG) BY MOUTH DAILY    ROS:  Review of Systems  Constitutional: Negative.   HENT: Negative.        Tongue pain  Eyes: Negative.   Respiratory: Negative.   Cardiovascular: Negative.   Gastrointestinal: Negative.   Genitourinary: Negative.        Malodorous urine  Musculoskeletal: Positive for joint pain.  Skin: Negative.   Neurological: Negative.   Endo/Heme/Allergies: Negative.   Psychiatric/Behavioral: Negative.   All other systems reviewed and are negative.    Objective:   Today's Vitals: BP 108/62   Pulse 85   Resp 16   Ht '5\' 6"'$  (1.676 m)   Wt 217 lb 1.9 oz (98.5 kg)   SpO2 98%   BMI 35.04 kg/m  Vitals with BMI 12/30/2019 12/22/2019 12/04/2019  Height '5\' 6"'$  - '5\' 6"'$   Weight 217 lbs 2 oz - 217 lbs 3 oz  BMI 02.40 - 97.35  Systolic 329 924 92  Diastolic 62 76 56  Pulse 85 71 74     Physical Exam Vitals and nursing note reviewed.  Constitutional:      Appearance: Normal appearance. She is well-developed and well-groomed. She is  obese.  HENT:     Head: Normocephalic and atraumatic.     Right Ear: External ear normal.     Left Ear: External ear normal.     Mouth/Throat:     Comments: Mask in place Eyes:     General:        Right eye: No discharge.        Left eye: No discharge.     Conjunctiva/sclera: Conjunctivae normal.     Comments: Glasses present  Cardiovascular:     Rate and Rhythm: Normal rate and regular rhythm.  Pulses: Normal pulses.     Heart sounds: Normal heart sounds.  Pulmonary:     Effort: Pulmonary effort is normal.     Breath sounds: Normal breath sounds.  Musculoskeletal:        General: Normal range of motion.     Cervical back: Normal range of motion and neck supple.  Skin:    General: Skin is warm.  Neurological:     General: No focal deficit present.     Mental Status: She is alert and oriented to person, place, and time.  Psychiatric:        Attention and Perception: Attention normal.        Mood and Affect: Mood normal.        Speech: Speech normal.        Behavior: Behavior normal. Behavior is cooperative.        Thought Content: Thought content normal.        Cognition and Memory: Cognition normal.        Judgment: Judgment normal.     Comments: Good communication, pleasant throughout conversation good eye contact    Assessment   1. Trochanteric bursitis of left hip   2. 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 Malodorous urine   4. Hypothyroidism following radioiodine therapy   5. Ductal carcinoma in situ (DCIS) of left breast   6. Geographic tongue     Tests ordered Orders Placed This Encounter  Procedures  . Urine Culture  . POCT URINALYSIS DIP (CLINITEK)     Plan: Please see assessment and plan per problem list above.   No orders of the defined types were placed in this encounter.   Patient to follow-up in 6 months   Perlie Mayo, NP

## 2019-12-30 NOTE — Assessment & Plan Note (Signed)
Reports that she would like to lose some weight.  Possibly having some linkage to her hypothyroidism.  She reports that when she was taken off of her proper medication she lost 50 pounds but then when she got back on her other medication she stopped losing weight.  Vernadette Stutsman is educated about the importance of exercise daily to help with weight management. A minumum of 30 minutes daily is recommended. Additionally, importance of healthy food choices  with portion control discussed.  Wt Readings from Last 3 Encounters:  12/30/19 217 lb 1.9 oz (98.5 kg)  12/04/19 (!) 217 lb 3.2 oz (98.5 kg)  11/26/19 216 lb 11.2 oz (98.3 kg)

## 2019-12-30 NOTE — Patient Instructions (Signed)
I appreciate the opportunity to provide you with care for your health and wellness. Today we discussed: established care   Follow up: 6 months   No labs or referrals today  Great to meet you today :)  Safe travels next week.  L hip pain/bursitis: Ibuprofen 400 mg twice daily, heat and ice whichever you prefer as needed.  Avoid excessive sitting, crossing your legs.  Also additionally avoid laying on the left side. If you start to feel better in a couple days you can back off ibuprofen.    Please continue to practice social distancing to keep you, your family, and our community safe.  If you must go out, please wear a mask and practice good handwashing.  It was a pleasure to see you and I look forward to continuing to work together on your health and well-being. Please do not hesitate to call the office if you need care or have questions about your care.  Have a wonderful day and week. With Gratitude, Cherly Beach, DNP, AGNP-BC

## 2019-12-30 NOTE — Assessment & Plan Note (Signed)
Followed closely by [facility]

## 2019-12-30 NOTE — Assessment & Plan Note (Signed)
Ibuprofen 400 mg twice daily for the next 3 to 7 days.  Advised for heat or ice and rotation.  Or which ever when she prefers.  Encouraged not to cross her legs, lay on her leg or over walk or sit for long periods of time.

## 2019-12-31 ENCOUNTER — Other Ambulatory Visit (HOSPITAL_COMMUNITY)
Admission: RE | Admit: 2019-12-31 | Discharge: 2019-12-31 | Disposition: A | Discharge status: Home / Self Care | Payer: Medicare Other | Source: Other Acute Inpatient Hospital | Attending: Family Medicine | Admitting: Family Medicine

## 2019-12-31 DIAGNOSIS — R8290 Unspecified abnormal findings in urine: Secondary | ICD-10-CM | POA: Diagnosis not present

## 2020-01-02 ENCOUNTER — Encounter: Payer: Self-pay | Admitting: Family Medicine

## 2020-01-02 LAB — URINE CULTURE

## 2020-01-02 NOTE — Telephone Encounter (Signed)
Does anything else need to be done?

## 2020-01-05 ENCOUNTER — Other Ambulatory Visit: Payer: Self-pay

## 2020-01-05 ENCOUNTER — Other Ambulatory Visit (HOSPITAL_COMMUNITY)
Admission: RE | Admit: 2020-01-05 | Discharge: 2020-01-05 | Disposition: A | Discharge status: Home / Self Care | Payer: Medicare Other | Source: Ambulatory Visit | Attending: Family Medicine | Admitting: Family Medicine

## 2020-01-05 DIAGNOSIS — R102 Pelvic and perineal pain: Secondary | ICD-10-CM | POA: Insufficient documentation

## 2020-01-05 DIAGNOSIS — R8290 Unspecified abnormal findings in urine: Secondary | ICD-10-CM | POA: Diagnosis not present

## 2020-01-06 LAB — URINE CULTURE: Culture: 10000 — AB

## 2020-01-07 ENCOUNTER — Telehealth: Payer: Self-pay

## 2020-01-07 ENCOUNTER — Other Ambulatory Visit: Payer: Self-pay | Admitting: Family Medicine

## 2020-01-07 DIAGNOSIS — R102 Pelvic and perineal pain: Secondary | ICD-10-CM

## 2020-01-07 DIAGNOSIS — R8290 Unspecified abnormal findings in urine: Secondary | ICD-10-CM

## 2020-01-07 MED ORDER — SULFAMETHOXAZOLE-TRIMETHOPRIM 800-160 MG PO TABS: 1.0000 | ORAL_TABLET | Freq: Two times a day (BID) | ORAL | 0 refills | Status: AC

## 2020-01-07 NOTE — Telephone Encounter (Signed)
Patient aware of results with verbal understanding.  

## 2020-01-07 NOTE — Telephone Encounter (Signed)
Pt returning call for abby about results

## 2020-01-12 DIAGNOSIS — Z20822 Contact with and (suspected) exposure to covid-19: Secondary | ICD-10-CM | POA: Diagnosis not present

## 2020-01-13 ENCOUNTER — Encounter: Payer: Self-pay | Admitting: Family Medicine

## 2020-01-14 ENCOUNTER — Encounter: Payer: Self-pay | Admitting: Family Medicine

## 2020-01-14 ENCOUNTER — Other Ambulatory Visit: Payer: Self-pay

## 2020-01-14 ENCOUNTER — Telehealth (INDEPENDENT_AMBULATORY_CARE_PROVIDER_SITE_OTHER): Payer: Medicare Other | Admitting: Family Medicine

## 2020-01-14 VITALS — BP 108/62 | Ht 66.0 in | Wt 217.0 lb

## 2020-01-14 DIAGNOSIS — U071 COVID-19: Secondary | ICD-10-CM | POA: Insufficient documentation

## 2020-01-14 DIAGNOSIS — Z7189 Other specified counseling: Secondary | ICD-10-CM | POA: Insufficient documentation

## 2020-01-14 HISTORY — DX: COVID-19: U07.1

## 2020-01-14 NOTE — Assessment & Plan Note (Signed)
Covid positive [place] out of state.  I have advised her to continue all treatments that she is doing.  And to pick up cough syrup that does not have the same TheraFlu.  Encouraged to reach out to the local hospital to see if they allow out-of-state residence to get monoclonal antibodies.  Overall symptoms appear to be mild.  Has had Covid vaccinations.  Educated on signs and symptoms to look for so that she could proceed to the nearest emergency room if needed.  Patient is in agreements with plan of care.

## 2020-01-14 NOTE — Patient Instructions (Addendum)
I appreciate the opportunity to provide you with care for your health and wellness. Today we discussed: Covid infection  Follow up: As scheduled  No labs or referrals today  We all hope that she started to feel better soon.  Please continue to practice social distancing to keep you, your family, and our community safe.  If you must go out, please wear a mask and practice good handwashing.  It was a pleasure to see you and I look forward to continuing to work together on your health and well-being. Please do not hesitate to call the office if you need care or have questions about your care.  Have a wonderful day and week. With Gratitude, Cherly Beach, DNP, AGNP-BC

## 2020-01-14 NOTE — Progress Notes (Signed)
Virtual Visit via Telephone Note   This visit type was conducted due to national recommendations for restrictions regarding the COVID-19 Pandemic (e.g. social distancing) in an effort to limit this patient's exposure and mitigate transmission in our community.  Due to her co-morbid illnesses, this patient is at least at moderate risk for complications without adequate follow up.  This format is felt to be most appropriate for this patient at this time.  The patient did not have access to video technology/had technical difficulties with video requiring transitioning to audio format only (telephone).  All issues noted in this document were discussed and addressed.  No physical exam could be performed with this format.   Evaluation Performed:  Follow-up visit  Date:  01/14/2020   ID:  Kara [name], DOB Jan 27, 1946, MRN [id number]  Patient Location: Home Provider Location: Office/Clinic  Location of Patient: Home Location of Provider: Telehealth Consent was obtain for visit to be over via telehealth. I verified that I am speaking with the correct person using two identifiers.  PCP:  Kara Mayo, NP   Chief Complaint:  COVID +  History of Present Illness:    Kara [name] is a 74 y.o. female patient of mine that is recently established.  Presents today for phone visit secondary to being in [place] and also Covid positive.  She reports that she flew out on August 24 [place] as she is still in the home out there.  On the 27th she started feeling bad and had a fever of 100.1 developed some chills as well.  On the 28th she developed chest tightness and chills and a cough.  The 29th she started feeling even worse temperature remained above 100, had some congestion, very bad headache and felt like she had a bad head cold.  She reports her temperature was over 100 for 4 days.  Today it is 99.6.  She was tested on August 30 at a [facility] in [place].  And was told that she was positive on the 31st.  She  has been checking her pulse ox which is been 95% or higher.  He has been taking Tylenol and TheraFlu.  She has a cough but it is dry no productive mucus.  She denies having shortness of breath.  She reports no energy.  Not moving around a lot has been trying to rest.  She was due to fly home this Saturday however she has to be a 2-week quarantine secondary to being positive so she will not be able to come home until September 18.  She reports wanting to try to figure out how to get monoclonal antibody infusion.  However secondary to not being here I am unsure how to help her with this.  Have asked her to contact her local hospital to see what their suggestion is.  The patient does not have symptoms concerning for COVID-19 infection (fever, chills, cough, or new shortness of breath).   Past Medical, Surgical, Social History, Allergies, and Medications have been Reviewed.  Past Medical History:  Diagnosis Date  . Allergy    food allergies, drug allergies  . Anemia   . Anxiety   . Arthritis   . Breast pain, left 02/25/2018  . Cataract   . Change in bowel function 04/09/2018  . Chronic pain of left knee 09/03/2019  . Dyspepsia 09/29/2016  . Family history of breast cancer   . Family history of cancer 08/23/2016   Aunt is [employer]  . Family history  of prostate cancer   . Genetic testing 08/11/2019   Negative genetic testing on the [facility] 9-gene STAT panel.  The STAT Breast cancer panel offered by [facility] includes sequencing and rearrangement analysis for the following 9 genes:  ATM, BRCA1, BRCA2, CDH1, CHEK2, PALB2, PTEN, STK11 and TP53.   The report date is August 09, 2019.  Marland Kitchen GERD (gastroesophageal reflux disease)   . H/O swallowed foreign body 11/20/2018  . Headache in back of head 12/31/2018  . Hip pain, acute, left 03/27/2019  . HLD (hyperlipidemia) 08/23/2016  . Hyperlipidemia   . Hypothyroid 08/14/2016  . Insomnia due to stress 12/23/2018  . Malignant neoplasm of upper-outer quadrant  of left breast in female, estrogen receptor negative ([place]) 06/11/2019   DCIS left breast  . Perimenopausal vasomotor symptoms 02/11/2016  . Postmenopausal atrophic vaginitis 11/09/2015  . Pre-diabetes    no meds  . Prediabetes 08/30/2017  . Thyroid disease    Grave's  . Vaginal discharge 01/16/2019  . Weight loss, unintentional 03/27/2019   Past Surgical History:  Procedure Laterality Date  . ABDOMINAL HYSTERECTOMY     bleeding. fibroids  . BREAST LUMPECTOMY WITH RADIOACTIVE SEED LOCALIZATION Left 07/08/2019   Procedure: LEFT BREAST LUMPECTOMY WITH RADIOACTIVE SEED LOCALIZATION;  Surgeon: Jovita Kussmaul, MD;  Location: [place];  Service: General;  Laterality: Left;  . CESAREAN SECTION    . CHOLECYSTECTOMY    . COLONOSCOPY  2011   [place]: two 3-4 mm sessile polyps, hyperplastic, sigmoid diverticula, TI appeared normal.   . ESOPHAGOGASTRODUODENOSCOPY  2011   [place]: non-bleeding erosive gastropathy, normal duodenum. path: unremarkable duodenum, negative H.pylori, minimal esophagitis   . TONSILLECTOMY  1975     Current Meds  Medication Sig  . acetaminophen (TYLENOL) 500 MG tablet Take 500 mg by mouth every 6 (six) hours as needed.  . Ascorbic Acid (VITAMIN C WITH ROSE HIPS) 500 MG tablet Take 500 mg by mouth daily.  . Blood Pressure Monitoring (B-D ASSURE BPM/AUTO ARM CUFF) MISC Check your BP once daily  . cholecalciferol (VITAMIN D3) 25 MCG (1000 UNIT) tablet Take 1,000 Units by mouth daily.  Marland Kitchen EPINEPHrine 0.3 mg/0.3 mL IJ SOAJ injection Inject 0.3 mLs (0.3 mg total) into the skin as needed.  . famotidine (PEPCID) 20 MG tablet Take 20 mg by mouth 2 (two) times daily as needed.   . Multiple Vitamin (MULTIVITAMIN) tablet Take 1 tablet by mouth daily.  Marland Kitchen SYNTHROID 112 MCG tablet TAKE 1 TABLET(112 MCG) BY MOUTH DAILY     Allergies:   Prednisone, Other, and Naproxen   ROS:   Please see the history of present illness.    All other systems reviewed and are  negative.   Labs/Other Tests and Data Reviewed:    Recent Labs: 09/08/2019: TSH 0.78 10/22/2019: ALT 30; BUN 17; Creat 0.83; Hemoglobin 13.8; Platelets 189; Potassium 4.5; Sodium 139   Recent Lipid Panel Lab Results  Component Value Date/Time   CHOL 194 10/22/2019 09:58 AM   TRIG 68 10/22/2019 09:58 AM   HDL 64 10/22/2019 09:58 AM   CHOLHDL 3.0 10/22/2019 09:58 AM   LDLCALC 114 (H) 10/22/2019 09:58 AM    Wt Readings from Last 3 Encounters:  01/14/20 217 lb (98.4 kg)  12/30/19 217 lb 1.9 oz (98.5 kg)  12/04/19 (!) 217 lb 3.2 oz (98.5 kg)     Objective:    Vital Signs:  BP 108/62   Ht _0  (1.676 m)   Wt 217 lb (98.4 kg)  BMI 35.02 kg/m    VITAL SIGNS:  reviewed GEN:  alert and oriented RESPIRATORY:  no shortness of breath in conversation  PSYCH:  normal affect and mood   ASSESSMENT & PLAN:    1. COVID-19 virus infection   Time:   Today, I have spent 10 minutes with the patient with telehealth technology discussing the above problems.     Medication Adjustments/Labs and Tests Ordered: Current medicines are reviewed at length with the patient today.  Concerns regarding medicines are outlined above.   Tests Ordered: No orders of the defined types were placed in this encounter.   Medication Changes: No orders of the defined types were placed in this encounter.   Disposition:  Follow up as scheduled  Signed, Kara Mayo, NP  01/14/2020 4:19 PM     [facility]

## 2020-01-15 ENCOUNTER — Telehealth: Payer: Medicare Other | Admitting: Family Medicine

## 2020-01-15 DIAGNOSIS — U071 COVID-19: Secondary | ICD-10-CM | POA: Diagnosis not present

## 2020-01-16 DIAGNOSIS — Z886 Allergy status to analgesic agent status: Secondary | ICD-10-CM | POA: Diagnosis not present

## 2020-01-16 DIAGNOSIS — E871 Hypo-osmolality and hyponatremia: Secondary | ICD-10-CM | POA: Diagnosis not present

## 2020-01-16 DIAGNOSIS — D0512 Intraductal carcinoma in situ of left breast: Secondary | ICD-10-CM | POA: Diagnosis not present

## 2020-01-16 DIAGNOSIS — R55 Syncope and collapse: Secondary | ICD-10-CM | POA: Diagnosis not present

## 2020-01-16 DIAGNOSIS — U071 COVID-19: Secondary | ICD-10-CM | POA: Diagnosis not present

## 2020-01-16 DIAGNOSIS — I951 Orthostatic hypotension: Secondary | ICD-10-CM | POA: Diagnosis not present

## 2020-01-16 DIAGNOSIS — E861 Hypovolemia: Secondary | ICD-10-CM | POA: Diagnosis not present

## 2020-01-16 DIAGNOSIS — R079 Chest pain, unspecified: Secondary | ICD-10-CM | POA: Diagnosis not present

## 2020-01-16 DIAGNOSIS — Z853 Personal history of malignant neoplasm of breast: Secondary | ICD-10-CM | POA: Diagnosis not present

## 2020-01-16 DIAGNOSIS — S0990XA Unspecified injury of head, initial encounter: Secondary | ICD-10-CM | POA: Diagnosis not present

## 2020-01-16 DIAGNOSIS — Z888 Allergy status to other drugs, medicaments and biological substances status: Secondary | ICD-10-CM | POA: Diagnosis not present

## 2020-01-16 DIAGNOSIS — Z923 Personal history of irradiation: Secondary | ICD-10-CM | POA: Diagnosis not present

## 2020-01-16 DIAGNOSIS — E860 Dehydration: Secondary | ICD-10-CM | POA: Diagnosis not present

## 2020-01-16 DIAGNOSIS — E869 Volume depletion, unspecified: Secondary | ICD-10-CM | POA: Diagnosis not present

## 2020-01-16 DIAGNOSIS — R519 Headache, unspecified: Secondary | ICD-10-CM | POA: Diagnosis not present

## 2020-01-16 DIAGNOSIS — R7401 Elevation of levels of liver transaminase levels: Secondary | ICD-10-CM | POA: Diagnosis not present

## 2020-01-17 DIAGNOSIS — R55 Syncope and collapse: Secondary | ICD-10-CM | POA: Diagnosis not present

## 2020-01-17 DIAGNOSIS — U071 COVID-19: Secondary | ICD-10-CM | POA: Diagnosis not present

## 2020-01-17 DIAGNOSIS — E871 Hypo-osmolality and hyponatremia: Secondary | ICD-10-CM | POA: Diagnosis not present

## 2020-01-17 DIAGNOSIS — E869 Volume depletion, unspecified: Secondary | ICD-10-CM | POA: Diagnosis not present

## 2020-01-19 DIAGNOSIS — U071 COVID-19: Secondary | ICD-10-CM | POA: Diagnosis not present

## 2020-01-19 DIAGNOSIS — Z23 Encounter for immunization: Secondary | ICD-10-CM | POA: Diagnosis not present

## 2020-01-21 ENCOUNTER — Encounter: Payer: Self-pay | Admitting: Family Medicine

## 2020-01-21 ENCOUNTER — Telehealth: Payer: Self-pay | Admitting: *Deleted

## 2020-01-21 ENCOUNTER — Encounter: Payer: Self-pay | Admitting: Radiation Oncology

## 2020-01-21 NOTE — Telephone Encounter (Signed)
Pt had phone visit 01-14-20 any suggestions

## 2020-01-21 NOTE — Telephone Encounter (Signed)
There are not treatments for viral PNA, unless someone thought she need an antiviral medication if they thought the flu virus was the cause.  But the cause is most likely COVID. If she is cough or having SHOB she needs to go to her ED there and be seen. I am unable to do any treatment of that nature out of state.

## 2020-01-21 NOTE — Telephone Encounter (Signed)
Called patient to ask about rescheduling 01-26-20 fu, patient agreed to come on 02-19-20 @ 9:30 am

## 2020-01-21 NOTE — Telephone Encounter (Signed)
Pt advised of recommendations with verbal understanding

## 2020-01-23 DIAGNOSIS — Z20828 Contact with and (suspected) exposure to other viral communicable diseases: Secondary | ICD-10-CM | POA: Diagnosis not present

## 2020-01-26 ENCOUNTER — Ambulatory Visit: Payer: Medicare Other | Admitting: Radiation Oncology

## 2020-02-04 ENCOUNTER — Other Ambulatory Visit: Payer: Self-pay

## 2020-02-04 ENCOUNTER — Telehealth (INDEPENDENT_AMBULATORY_CARE_PROVIDER_SITE_OTHER): Payer: Medicare Other | Admitting: Family Medicine

## 2020-02-04 ENCOUNTER — Encounter: Payer: Self-pay | Admitting: Family Medicine

## 2020-02-04 VITALS — BP 108/62 | Ht 66.0 in | Wt 217.0 lb

## 2020-02-04 DIAGNOSIS — U071 COVID-19: Secondary | ICD-10-CM | POA: Diagnosis not present

## 2020-02-04 DIAGNOSIS — R55 Syncope and collapse: Secondary | ICD-10-CM | POA: Diagnosis not present

## 2020-02-04 DIAGNOSIS — R0602 Shortness of breath: Secondary | ICD-10-CM | POA: Insufficient documentation

## 2020-02-04 DIAGNOSIS — Z7189 Other specified counseling: Secondary | ICD-10-CM | POA: Diagnosis not present

## 2020-02-04 MED ORDER — ALBUTEROL SULFATE HFA 108 (90 BASE) MCG/ACT IN AERS: 1.0000 | INHALATION_SPRAY | Freq: Four times a day (QID) | RESPIRATORY_TRACT | 1 refills | Status: DC | PRN

## 2020-02-04 NOTE — Assessment & Plan Note (Signed)
Additional education about the Covid virus provided.  Regarding immunity, booster needs, monoclonal antibodies and other questions were all answered to the patient's understanding

## 2020-02-04 NOTE — Assessment & Plan Note (Signed)
Resolved

## 2020-02-04 NOTE — Assessment & Plan Note (Signed)
Had a syncopal event secondary to being found that she had Covid.  Hit the back of her head.  Overall doing well with this and not having any residual bleeding.  She does have some residual pain and discomfort especially when she is messing with her hair laying on it.  She is educated that she might of contused this goal and that might be part of the issue and that it will take some time for it to feel better completely.  If she does not get better next couple weeks she is advised to call back.

## 2020-02-04 NOTE — Progress Notes (Signed)
Virtual Visit via Telephone Note   This visit type was conducted due to national recommendations for restrictions regarding the COVID-19 Pandemic (e.g. social distancing) in an effort to limit this patient's exposure and mitigate transmission in our community.  Due to her co-morbid illnesses, this patient is at least at moderate risk for complications without adequate follow up.  This format is felt to be most appropriate for this patient at this time.  The patient did not have access to video technology/had technical difficulties with video requiring transitioning to audio format only (telephone).  All issues noted in this document were discussed and addressed.  No physical exam could be performed with this format.   Evaluation Performed:  Follow-up visit  Date:  02/04/2020   ID:  Kara [name], DOB January 27, 1946, MRN [id number]  Patient Location: Home Provider Location: Office/Clinic  Location of Patient: Home Location of Provider: Telehealth Consent was obtain for visit to be over via telehealth. I verified that I am speaking with the correct person using two identifiers.  PCP:  Perlie Mayo, NP   Chief Complaint: Follow-up questions post Covid  History of Present Illness:    Kara [name] is a 74 y.o. female with with allergies, recent history of COVID-19, hyperlipidemia, hypothyroidism, breast cancer, prediabetes among others.  She presents today for an acute follow-up visit for questions regarding upcoming treatment measures post Covid.  She contracted Covid while in [place] did receive monoclonal antibodies while there.  Was noted to have pneumonia secondary to Covid viral related.  Was only hospitalized for 1 day.  She reports that overall she is doing much better.  In the last 24 hours she has felt the best she has felt since having Covid.  Prior to being diagnosed with Covid she had had a fall that she had sustained with some bleeding and swelling to the back of her head which  prompted her to be seen in the emergency room upon then finding Covid at that time.  She reports some leftover cough and shortness of breath at times.  Would like to have inhaler sent in if able.  As she was using inhaler and nebulizer treatment when she was in [place].  She denies having any active chest pain, fever, chills, cough or new shortness of breath.  She reports she would like to know when to get her flu shot and the booster for Covid.  She also would like to know when she will not be as tired as she is right now.   She also reports that the back of her head is still little bit sore and she wanted to know when that would possibly go away. She denies having any known recollection of loss of consciousness.  In review of the hospital note where she was in [place] there is no suspicion of a concussion.  There is no suspicion of any other types of injury or need for CT scan of the head.  Overall focus was geared toward Covid.  Her orthostatics were within normal range.  The patient does not have symptoms concerning for COVID-19 infection (fever, chills, cough, or new shortness of breath).   Past Medical, Surgical, Social History, Allergies, and Medications have been Reviewed.  Past Medical History:  Diagnosis Date  . Allergy    food allergies, drug allergies  . Anemia   . Anxiety   . Arthritis   . Breast pain, left 02/25/2018  . Cataract   . Change in bowel function 04/09/2018  .  Chronic pain of left knee 09/03/2019  . COVID-19 virus infection 01/14/2020  . Dyspepsia 09/29/2016  . Family history of breast cancer   . Family history of cancer 08/23/2016   Aunt is [employer]  . Family history of prostate cancer   . Genetic testing 08/11/2019   Negative genetic testing on the [facility] 9-gene STAT panel.  The STAT Breast cancer panel offered by [facility] includes sequencing and rearrangement analysis for the following 9 genes:  ATM, BRCA1, BRCA2, CDH1, CHEK2, PALB2, PTEN, STK11 and TP53.    The report date is August 09, 2019.  Marland Kitchen GERD (gastroesophageal reflux disease)   . H/O swallowed foreign body 11/20/2018  . Headache in back of head 12/31/2018  . Hip pain, acute, left 03/27/2019  . HLD (hyperlipidemia) 08/23/2016  . Hyperlipidemia   . Hypothyroid 08/14/2016  . Insomnia due to stress 12/23/2018  . Malignant neoplasm of upper-outer quadrant of left breast in female, estrogen receptor negative ([place]) 06/11/2019   DCIS left breast  . Perimenopausal vasomotor symptoms 02/11/2016  . Postmenopausal atrophic vaginitis 11/09/2015  . Pre-diabetes    no meds  . Prediabetes 08/30/2017  . Thyroid disease    Grave's  . Vaginal discharge 01/16/2019  . Weight loss, unintentional 03/27/2019   Past Surgical History:  Procedure Laterality Date  . ABDOMINAL HYSTERECTOMY     bleeding. fibroids  . BREAST LUMPECTOMY WITH RADIOACTIVE SEED LOCALIZATION Left 07/08/2019   Procedure: LEFT BREAST LUMPECTOMY WITH RADIOACTIVE SEED LOCALIZATION;  Surgeon: Jovita Kussmaul, MD;  Location: [place];  Service: General;  Laterality: Left;  . CESAREAN SECTION    . CHOLECYSTECTOMY    . COLONOSCOPY  2011   [place]: two 3-4 mm sessile polyps, hyperplastic, sigmoid diverticula, TI appeared normal.   . ESOPHAGOGASTRODUODENOSCOPY  2011   [place]: non-bleeding erosive gastropathy, normal duodenum. path: unremarkable duodenum, negative H.pylori, minimal esophagitis   . TONSILLECTOMY  1975     Current Meds  Medication Sig  . acetaminophen (TYLENOL) 500 MG tablet Take 500 mg by mouth every 6 (six) hours as needed.  . Ascorbic Acid (VITAMIN C WITH ROSE HIPS) 500 MG tablet Take 500 mg by mouth daily.  . Blood Pressure Monitoring (B-D ASSURE BPM/AUTO ARM CUFF) MISC Check your BP once daily  . cholecalciferol (VITAMIN D3) 25 MCG (1000 UNIT) tablet Take 1,000 Units by mouth daily.  Marland Kitchen EPINEPHrine 0.3 mg/0.3 mL IJ SOAJ injection Inject 0.3 mLs (0.3 mg total) into the skin as needed.  . famotidine (PEPCID)  20 MG tablet Take 20 mg by mouth 2 (two) times daily as needed.   . Multiple Vitamin (MULTIVITAMIN) tablet Take 1 tablet by mouth daily.  Marland Kitchen SYNTHROID 112 MCG tablet TAKE 1 TABLET(112 MCG) BY MOUTH DAILY     Allergies:   Prednisone, Other, and Naproxen   ROS:   Please see the history of present illness.    All other systems reviewed and are negative.   Labs/Other Tests and Data Reviewed:    Recent Labs: 09/08/2019: TSH 0.78 10/22/2019: ALT 30; BUN 17; Creat 0.83; Hemoglobin 13.8; Platelets 189; Potassium 4.5; Sodium 139   Recent Lipid Panel Lab Results  Component Value Date/Time   CHOL 194 10/22/2019 09:58 AM   TRIG 68 10/22/2019 09:58 AM   HDL 64 10/22/2019 09:58 AM   CHOLHDL 3.0 10/22/2019 09:58 AM   LDLCALC 114 (H) 10/22/2019 09:58 AM    Wt Readings from Last 3 Encounters:  02/04/20 217 lb (98.4 kg)  01/14/20 217 lb (  98.4 kg)  12/30/19 217 lb 1.9 oz (98.5 kg)     Objective:    Vital Signs:  BP 108/62   Ht $R'5\' 6"'VW$  (1.676 m)   Wt 217 lb (98.4 kg)   BMI 35.02 kg/m    VITAL SIGNS:  reviewed GEN:  Alert and oriented RESPIRATORY:  No shortness of breath noted in conversation PSYCH:  Normal affect and mood  ASSESSMENT & PLAN:    1. COVID-19 virus infection   2. Educated about COVID-19 virus infection  3. Shortness of breath - albuterol (VENTOLIN HFA) 108 (90 Base) MCG/ACT inhaler; Inhale 1-2 puffs into the lungs every 6 (six) hours as needed for wheezing or shortness of breath.  Dispense: 8 g; Refill: 1  4. Syncope and collapse   Time:   Today, I have spent 15 minutes with the patient with telehealth technology discussing the above problems.     Medication Adjustments/Labs and Tests Ordered: Current medicines are reviewed at length with the patient today.  Concerns regarding medicines are outlined above.   Tests Ordered: No orders of the defined types were placed in this encounter.   Medication Changes: Meds ordered this encounter  Medications  .  albuterol (VENTOLIN HFA) 108 (90 Base) MCG/ACT inhaler    Sig: Inhale 1-2 puffs into the lungs every 6 (six) hours as needed for wheezing or shortness of breath.    Dispense:  8 g    Refill:  1    Order Specific Question:   Supervising Provider    Answer:   Fayrene Helper [[id number]]    Disposition:  Follow up 07/01/2020 Signed, Perlie Mayo, NP  02/04/2020 11:50 AM     [facility]

## 2020-02-04 NOTE — Patient Instructions (Signed)
I appreciate the opportunity to provide you with care for your health and wellness. Today we discussed:   Follow up:   No labs or referrals today  Please continue to practice social distancing to keep you, your family, and our community safe.  If you must go out, please wear a mask and practice good handwashing.  It was a pleasure to see you and I look forward to continuing to work together on your health and well-being. Please do not hesitate to call the office if you need care or have questions about your care.  Have a wonderful day and week. With Gratitude, Cherly Beach, DNP, AGNP-BC

## 2020-02-04 NOTE — Assessment & Plan Note (Signed)
The leg residual shortness of breath at times secondary to Covid history.  Will be doing inhaler just to have on hand, Patient acknowledged agreement and understanding of the plan.   Reviewed side effects, risks and benefits of medication.

## 2020-02-10 ENCOUNTER — Encounter: Payer: Self-pay | Admitting: Family Medicine

## 2020-02-11 ENCOUNTER — Other Ambulatory Visit: Payer: Self-pay

## 2020-02-11 ENCOUNTER — Ambulatory Visit (INDEPENDENT_AMBULATORY_CARE_PROVIDER_SITE_OTHER): Payer: Medicare Other

## 2020-02-11 DIAGNOSIS — Z23 Encounter for immunization: Secondary | ICD-10-CM | POA: Diagnosis not present

## 2020-02-18 NOTE — Progress Notes (Signed)
Radiation Oncology         [phone] ________________________________  Name: Kara [name] MRN: [id number]  Date: 02/19/2020  DOB: 1945-06-28  Follow-Up Visit Note  CC: Perlie Mayo, NP  Maryruth Hancock, MD    ICD-10-CM   1. Ductal carcinoma in situ (DCIS) of left breast  D05.12     Diagnosis: Stage 0 (cN0, M0) Left Breast UOQ, DCIS, ER- / PR-, Grade 3  Interval Since Last Radiation: Five months, two weeks, and four days  Radiation Treatment Dates: 08/11/2019 through 09/01/2019 Site Technique Total Dose (Gy) Dose per Fx (Gy) Completed Fx Beam Energies  Breast, Left: Breast_Lt 3D 42.72/42.72 2.67 16/16 6X    Narrative:  The patient returns today for routine follow-up. Since her last visit, she underwent physical therapy for chronic right knee pain. She was seen by Wilber Bihari, NP, on 11/26/2019 for [facility]. She remains under surveillance.  Of note, she was seen at Urgent Care on 12/22/2019 with chief complaint of dysuria. UA was negative for UTI. Additionally, she was diagnosed with COVID-19 on 01/14/2020 while traveling out of state.  She reports contracting Covid while visiting her family out in [place].  She did report undergoing antibiotic therapy but did not require hospitalization.  She was given inhalers but did not require any oxygen.  Patient reports noticing intense burning in the left breast and arm soon after receiving the antibiotic therapy.  This did not persist later on.  On review of systems, she reports anxiety recently moving her mother into a nursing home. She denies pain within the breast area nipple discharge or bleeding.  She denies any swelling in her left arm or hand..  ALLERGIES:  is allergic to prednisone, other, and naproxen.  Meds: Current Outpatient Medications  Medication Sig Dispense Refill  . acetaminophen (TYLENOL) 500 MG tablet Take 500 mg by mouth every 6 (six) hours as needed.    Marland Kitchen albuterol (VENTOLIN HFA) 108 (90 Base) MCG/ACT  inhaler Inhale 1-2 puffs into the lungs every 6 (six) hours as needed for wheezing or shortness of breath. 8 g 1  . Ascorbic Acid (VITAMIN C WITH ROSE HIPS) 500 MG tablet Take 500 mg by mouth daily.    . Blood Pressure Monitoring (B-D ASSURE BPM/AUTO ARM CUFF) MISC Check your BP once daily 1 each 0  . cholecalciferol (VITAMIN D3) 25 MCG (1000 UNIT) tablet Take 1,000 Units by mouth daily.    Marland Kitchen EPINEPHrine 0.3 mg/0.3 mL IJ SOAJ injection Inject 0.3 mLs (0.3 mg total) into the skin as needed. 2 each 2  . Multiple Vitamin (MULTIVITAMIN) tablet Take 1 tablet by mouth daily.    Marland Kitchen SYNTHROID 112 MCG tablet TAKE 1 TABLET(112 MCG) BY MOUTH DAILY 90 tablet 1   No current facility-administered medications for this encounter.    Physical Findings: The patient is in no acute distress. Patient is alert and oriented.  height is 5\' 6"  (1.676 m) and weight is 220 lb 4 oz (99.9 kg). Her temporal temperature is 97.2 F (36.2 C) (abnormal). Her blood pressure is 111/59 (abnormal) and her pulse is 72. Her respiration is 20 and oxygen saturation is 100%.   Lungs are clear to auscultation bilaterally. Heart has regular rate and rhythm. No palpable cervical, supraclavicular, or axillary adenopathy. Abdomen soft, non-tender, normal bowel sounds. Right breast: No palpable mass, nipple discharge, or bleeding. Left breast: Mild hyperpigmentation changes noted.  Overall excellent cosmetic result.  Mild induration noted to the at the lumpectomy scar in  the upper aspect of breast.  No dominant mass appreciated in the breast area nipple discharge or bleeding.  Lab Findings: Lab Results  Component Value Date   WBC 5.3 10/22/2019   HGB 13.8 10/22/2019   HCT 43.5 10/22/2019   MCV 84.6 10/22/2019   PLT 189 10/22/2019    Radiographic Findings: No results found.  Impression: Stage 0 (cN0, M0) Left Breast UOQ, DCIS, ER- / PR-, Grade 3   No evidence of recurrence on clinical exam today.   Plan: The patient is scheduled  to follow up with Dr. Lindi Adie on 06/01/2020. She will follow up with radiation oncology on as needed basis.  Total time spent in this encounter was 15 minutes which included reviewing the patient's most recent follow-ups, ED visit, COVID-19 diagnosis, physical examination, and documentation.  ____________________________________   Blair Promise, PhD, MD  This document serves as a record of services personally performed by Gery Pray, MD. It was created on his behalf by Clerance Lav, a trained medical scribe. The creation of this record is based on the scribe's personal observations and the provider's statements to them. This document has been checked and approved by the attending provider.

## 2020-02-19 ENCOUNTER — Encounter: Payer: Self-pay | Admitting: Radiation Oncology

## 2020-02-19 ENCOUNTER — Ambulatory Visit
Admission: RE | Admit: 2020-02-19 | Discharge: 2020-02-19 | Disposition: A | Discharge status: Home / Self Care | Payer: Medicare Other | Source: Ambulatory Visit | Attending: Radiation Oncology | Admitting: Radiation Oncology

## 2020-02-19 ENCOUNTER — Other Ambulatory Visit: Payer: Self-pay

## 2020-02-19 VITALS — BP 111/59 | HR 72 | Temp 97.2°F | Resp 20 | Ht 66.0 in | Wt 220.2 lb

## 2020-02-19 DIAGNOSIS — Z08 Encounter for follow-up examination after completed treatment for malignant neoplasm: Secondary | ICD-10-CM | POA: Diagnosis not present

## 2020-02-19 DIAGNOSIS — Z86000 Personal history of in-situ neoplasm of breast: Secondary | ICD-10-CM | POA: Insufficient documentation

## 2020-02-19 DIAGNOSIS — Z79899 Other long term (current) drug therapy: Secondary | ICD-10-CM | POA: Insufficient documentation

## 2020-02-19 DIAGNOSIS — Z923 Personal history of irradiation: Secondary | ICD-10-CM | POA: Diagnosis not present

## 2020-02-19 DIAGNOSIS — D0512 Intraductal carcinoma in situ of left breast: Secondary | ICD-10-CM

## 2020-02-19 DIAGNOSIS — Z8616 Personal history of COVID-19: Secondary | ICD-10-CM | POA: Diagnosis not present

## 2020-02-19 NOTE — Progress Notes (Signed)
Patient had covid last month fainted at home and was hospitalized for fluids for 24 hours.Patient appears distressed but states she has just moved and her mother is in a nursing home. Denies any suicidal ideations when addressed that she appears sad states she does not like to be her. Denies pain.

## 2020-02-19 NOTE — Patient Instructions (Signed)
Coronavirus (COVID-19) Are you at risk?  Are you at risk for the Coronavirus (COVID-19)?  To be considered HIGH RISK for Coronavirus (COVID-19), you have to meet the following criteria:  . Traveled to [place], [place], [place], [place] or [place]; or in the [place] to [place], [place], [place], or [place]; and have fever, cough, and shortness of breath within the last 2 weeks of travel OR . Been in close contact with a person diagnosed with COVID-19 within the last 2 weeks and have fever, cough, and shortness of breath . IF YOU DO NOT MEET THESE CRITERIA, YOU ARE CONSIDERED LOW RISK FOR COVID-19.  What to do if you are HIGH RISK for COVID-19?  . If you are having a medical emergency, call 911. . Seek medical care right away. Before you go to a doctor's office, urgent care or emergency department, call ahead and tell them about your recent travel, contact with someone diagnosed with COVID-19, and your symptoms. You should receive instructions from your physician's office regarding next steps of care.  . When you arrive at healthcare provider, tell the healthcare staff immediately you have returned from visiting [place], [place], [place], [place] or [place]; or traveled in the [place] to [place], [place], [place], or [place]; in the last two weeks or you have been in close contact with a person diagnosed with COVID-19 in the last 2 weeks.   . Tell the health care staff about your symptoms: fever, cough and shortness of breath. . After you have been seen by a medical provider, you will be either: o Tested for (COVID-19) and discharged home on quarantine except to seek medical care if symptoms worsen, and asked to  - Stay home and avoid contact with others until you get your results (4-5 days)  - Avoid travel on public transportation if possible (such as bus, train, or airplane) or o Sent to the Emergency Department by EMS for evaluation, COVID-19 testing, and possible  admission depending on your condition and test results.  What to do if you are LOW RISK for COVID-19?  Reduce your risk of any infection by using the same precautions used for avoiding the common cold or flu:  . Wash your hands often with soap and warm water for at least 20 seconds.  If soap and water are not readily available, use an alcohol-based hand sanitizer with at least 60% alcohol.  . If coughing or sneezing, cover your mouth and nose by coughing or sneezing into the elbow areas of your shirt or coat, into a tissue or into your sleeve (not your hands). . Avoid shaking hands with others and consider head nods or verbal greetings only. . Avoid touching your eyes, nose, or mouth with unwashed hands.  . Avoid close contact with people who are sick. . Avoid places or events with large numbers of people in one location, like concerts or sporting events. . Carefully consider travel plans you have or are making. . If you are planning any travel outside or inside the [place], visit the CDC's Travelers' Health webpage for the latest health notices. . If you have some symptoms but not all symptoms, continue to monitor at home and seek medical attention if your symptoms worsen. . If you are having a medical emergency, call 911.   ADDITIONAL HEALTHCARE OPTIONS FOR PATIENTS  [facility] / e-Visit: [email or web address]         [facility]: [phone]  [facility]: [phone]                   [facility]: [phone]   

## 2020-03-01 ENCOUNTER — Encounter (HOSPITAL_COMMUNITY): Payer: Self-pay

## 2020-03-02 DIAGNOSIS — D0512 Intraductal carcinoma in situ of left breast: Secondary | ICD-10-CM | POA: Diagnosis not present

## 2020-03-04 ENCOUNTER — Other Ambulatory Visit: Payer: Self-pay

## 2020-03-04 DIAGNOSIS — E890 Postprocedural hypothyroidism: Secondary | ICD-10-CM

## 2020-03-05 ENCOUNTER — Other Ambulatory Visit: Payer: Self-pay | Admitting: "Endocrinology

## 2020-03-05 DIAGNOSIS — E890 Postprocedural hypothyroidism: Secondary | ICD-10-CM

## 2020-03-08 ENCOUNTER — Other Ambulatory Visit: Payer: Self-pay

## 2020-03-08 DIAGNOSIS — E890 Postprocedural hypothyroidism: Secondary | ICD-10-CM | POA: Diagnosis not present

## 2020-03-08 LAB — COMPREHENSIVE METABOLIC PANEL
AG Ratio: 1.2 (calc) (ref 1.0–2.5)
ALT: 15 U/L (ref 6–29)
AST: 18 U/L (ref 10–35)
Albumin: 3.7 g/dL (ref 3.6–5.1)
Alkaline phosphatase (APISO): 95 U/L (ref 37–153)
BUN: 19 mg/dL (ref 7–25)
CO2: 25 mmol/L (ref 20–32)
Calcium: 9.3 mg/dL (ref 8.6–10.4)
Chloride: 109 mmol/L (ref 98–110)
Creat: 0.75 mg/dL (ref 0.60–0.93)
Globulin: 3.1 g/dL (calc) (ref 1.9–3.7)
Glucose, Bld: 107 mg/dL — ABNORMAL HIGH (ref 65–99)
Potassium: 4.3 mmol/L (ref 3.5–5.3)
Sodium: 139 mmol/L (ref 135–146)
Total Bilirubin: 0.6 mg/dL (ref 0.2–1.2)
Total Protein: 6.8 g/dL (ref 6.1–8.1)

## 2020-03-08 NOTE — Progress Notes (Signed)
cmp

## 2020-03-09 LAB — T4, FREE: Free T4: 1.4 ng/dL (ref 0.8–1.8)

## 2020-03-09 LAB — TSH: TSH: 1.48 mIU/L (ref 0.40–4.50)

## 2020-03-11 ENCOUNTER — Other Ambulatory Visit: Payer: Self-pay

## 2020-03-11 ENCOUNTER — Ambulatory Visit (INDEPENDENT_AMBULATORY_CARE_PROVIDER_SITE_OTHER): Payer: Medicare Other | Admitting: "Endocrinology

## 2020-03-11 ENCOUNTER — Encounter: Payer: Self-pay | Admitting: "Endocrinology

## 2020-03-11 VITALS — BP 107/70 | HR 74 | Ht 66.0 in | Wt 220.0 lb

## 2020-03-11 DIAGNOSIS — E890 Postprocedural hypothyroidism: Secondary | ICD-10-CM | POA: Diagnosis not present

## 2020-03-11 MED ORDER — SYNTHROID 112 MCG PO TABS: ORAL_TABLET | ORAL | 3 refills | Status: DC

## 2020-03-11 NOTE — Progress Notes (Signed)
03/11/2020, 11:36 AM                          Endocrinology follow-up note    Kara [name] is a 74 y.o.-year-old female patient being seen in follow-up for management of  RAI induced hypothyroidism referred by Perlie Mayo, NP.   Past Medical History:  Diagnosis Date  . Allergy    food allergies, drug allergies  . Anemia   . Anxiety   . Arthritis   . Breast pain, left 02/25/2018  . Cataract   . Change in bowel function 04/09/2018  . Chronic pain of left knee 09/03/2019  . COVID-19 virus infection 01/14/2020  . Dyspepsia 09/29/2016  . Family history of breast cancer   . Family history of cancer 08/23/2016   Aunt is [employer]  . Family history of prostate cancer   . Genetic testing 08/11/2019   Negative genetic testing on the [facility] 9-gene STAT panel.  The STAT Breast cancer panel offered by [facility] includes sequencing and rearrangement analysis for the following 9 genes:  ATM, BRCA1, BRCA2, CDH1, CHEK2, PALB2, PTEN, STK11 and TP53.   The report date is August 09, 2019.  Marland Kitchen GERD (gastroesophageal reflux disease)   . H/O swallowed foreign body 11/20/2018  . Headache in back of head 12/31/2018  . Hip pain, acute, left 03/27/2019  . HLD (hyperlipidemia) 08/23/2016  . Hyperlipidemia   . Hypothyroid 08/14/2016  . Insomnia due to stress 12/23/2018  . 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 Perimenopausal vasomotor symptoms 02/11/2016  . Postmenopausal atrophic vaginitis 11/09/2015  . Pre-diabetes    no meds  . Prediabetes 08/30/2017  . Thyroid disease    Grave's  . Vaginal discharge 01/16/2019  . Weight loss, unintentional 03/27/2019    Past Surgical History:  Procedure Laterality Date  . ABDOMINAL HYSTERECTOMY     bleeding. fibroids  . BREAST LUMPECTOMY WITH RADIOACTIVE  SEED LOCALIZATION Left 07/08/2019   Procedure: LEFT BREAST LUMPECTOMY WITH RADIOACTIVE SEED LOCALIZATION;  Surgeon: Jovita Kussmaul, MD;  Location: [facility];  Service: General;  Laterality: Left;  . CESAREAN SECTION    . CHOLECYSTECTOMY    . COLONOSCOPY  2011   [place]: two 3-4 mm sessile polyps, hyperplastic, sigmoid diverticula, TI appeared normal.   . ESOPHAGOGASTRODUODENOSCOPY  2011   [place]: non-bleeding erosive gastropathy, normal duodenum. path: unremarkable duodenum, negative H.pylori, minimal esophagitis   . TONSILLECTOMY  1975    Social History   Socioeconomic History  . Marital status: Divorced    Spouse name: Not on file  . Number of children: 2  . Years of education: 71  . Highest education level: Not on file  Occupational History  . Occupation: retired  Comment: Environmental health practitioner at [employer]  Tobacco Use  . Smoking status: Former Smoker    Quit date: 06/15/1998    Years since quitting: 21.7  . Smokeless tobacco: Never Used  . Tobacco comment: quit 2002  Vaping Use  . Vaping Use: Never used  Substance and Sexual Activity  . Alcohol use: No  . Drug use: No  . Sexual activity: Not Currently    Birth control/protection: Surgical    Comment: hyst  Other Topics Concern  . Not on file  Social History Narrative   Lives alone   2 grown children- one in [place] and one here   [place] from [employer] for aging mother who is 67 with Alzheimers      Maternal Aunt is Stage manager of the [employer] cancer call line      Enjoy: gym, time with friends       Diet: eats all food groups   Caffeine: 2 diet cokes daily    Water: 2-4 cups daily       Wears seat belt    Does not use phone while driving    Smoke and carbon Careers adviser at home   Data processing manager    No weapons    Social Determinants of Health   Financial Resource Strain: [place]   . Difficulty of Paying Living Expenses: Not hard at all   Food Insecurity: No Food Insecurity  . Worried About Charity fundraiser in the Last Year: Never true  . Ran Out of Food in the Last Year: Never true  Transportation Needs: No Transportation Needs  . Lack of Transportation (Medical): No  . Lack of Transportation (Non-Medical): No  Physical Activity: Sufficiently Active  . Days of Exercise per Week: 5 days  . Minutes of Exercise per Session: 30 min  Stress: Stress Concern Present  . Feeling of Stress : To some extent  Social Connections: Socially Isolated  . Frequency of Communication with Friends and Family: More than three times a week  . Frequency of Social Gatherings with Friends and Family: Once a week  . Attends Religious Services: Never  . Active Member of Clubs or Organizations: No  . Attends Archivist Meetings: Never  . Marital Status: Divorced    Family History  Problem Relation Age of Onset  . Breast cancer Maternal Grandmother        dx over 81  . Prostate cancer Maternal Grandfather   . Arthritis Mother   . COPD Mother   . Breast cancer Mother 50       second at age 30  . Cancer Father        throat  . Stroke Father   . Cancer Maternal Aunt [address] Lacks, cervical cancer  . Breast cancer Maternal Aunt        dx over 72  . Breast cancer Paternal Aunt        dx under 58  . Prostate cancer Maternal Uncle        dx over 39  . Cancer Other        father's maternal cousin was Henreietta Lacks, Cervical  . Cancer Maternal Uncle        unknown cancer  . Breast cancer Paternal Aunt        dx over 49  . Colon cancer Neg Hx   . Allergic rhinitis  Neg Hx   . Angioedema Neg Hx   . Asthma Neg Hx   . Atopy Neg Hx   . Eczema Neg Hx   . Urticaria Neg Hx   . Immunodeficiency Neg Hx     Outpatient Encounter Medications as of 03/11/2020  Medication Sig  . acetaminophen (TYLENOL) 500 MG tablet Take 500 mg by mouth every 6 (six) hours as needed.  Marland Kitchen albuterol (VENTOLIN HFA) 108 (90 Base) MCG/ACT  inhaler Inhale 1-2 puffs into the lungs every 6 (six) hours as needed for wheezing or shortness of breath.  . Ascorbic Acid (VITAMIN C WITH ROSE HIPS) 500 MG tablet Take 500 mg by mouth daily.  . Blood Pressure Monitoring (B-D ASSURE BPM/AUTO ARM CUFF) MISC Check your BP once daily  . cholecalciferol (VITAMIN D3) 25 MCG (1000 UNIT) tablet Take 1,000 Units by mouth daily.  Marland Kitchen EPINEPHrine 0.3 mg/0.3 mL IJ SOAJ injection Inject 0.3 mLs (0.3 mg total) into the skin as needed.  . Multiple Vitamin (MULTIVITAMIN) tablet Take 1 tablet by mouth daily.  Marland Kitchen SYNTHROID 112 MCG tablet TAKE 1 TABLET(112 MCG) BY MOUTH DAILY  . [DISCONTINUED] SYNTHROID 112 MCG tablet TAKE 1 TABLET(112 MCG) BY MOUTH DAILY   No facility-administered encounter medications on file as of 03/11/2020.    ALLERGIES: Allergies  Allergen Reactions  . Prednisone Hypertension    Severe headache  . Other        . Naproxen Other (See Comments)    headache   VACCINATION STATUS: Immunization History  Administered Date(s) Administered  . Fluad Quad(high Dose 65+) 02/11/2020  . Influenza, High Dose Seasonal PF 02/08/2018, 02/17/2019  . Influenza,inj,Quad PF,6+ Mos 02/28/2017  . Influenza-Unspecified 02/28/2017  . PFIZER SARS-COV-2 Vaccination 06/06/2019, 06/24/2019  . Pneumococcal Conjugate-13 07/05/2016  . Pneumococcal Polysaccharide-23 04/29/2019  . Td 12/23/2018     HPI    Kara [name]  is a patient with the above medical history. she was diagnosed  with hyperthyroidism at approximate age of 68 years  which required I-131 thyroid ablation in environment.   -She was subsequently initiated on thyroid hormone supplement.  She is currently on Synthroid 112 mcg p.o. daily before breakfast.   She reports compliance to her medication. -She recently survived a Covid infection.  Does not have new complaints today.   -She describes steady weight gain, on and off hot flashes.  She did denies tremors, palpitations. Pt denies  feeling nodules in neck, hoarseness, dysphagia/odynophagia, SOB with lying down.  she denies family history of  thyroid disorders.  No family history of thyroid cancer.  Her father had head and neck tumor which did not appear to be related to thyroid cancer.  ROS:  Limited as above.   Physical Exam: BP 107/70   Pulse 74   Ht $R'5\' 6"'uj$  (1.676 m)   Wt 220 lb (99.8 kg)   SpO2 97%   BMI 35.51 kg/m  Wt Readings from Last 3 Encounters:  03/11/20 220 lb (99.8 kg)  02/19/20 220 lb 4 oz (99.9 kg)  02/04/20 217 lb (98.4 kg)    CMP     Component Value Date/Time   NA 139 03/08/2020 0935   K 4.3 03/08/2020 0935   CL 109 03/08/2020 0935   CO2 25 03/08/2020 0935   GLUCOSE 107 (H) 03/08/2020 0935   BUN 19 03/08/2020 0935   CREATININE 0.75 03/08/2020 0935   CALCIUM 9.3 03/08/2020 0935   PROT 6.8 03/08/2020 0935   ALBUMIN 3.5 01/19/2019 0329   AST 18  03/08/2020 0935   ALT 15 03/08/2020 0935   ALKPHOS 95 01/19/2019 0329   BILITOT 0.6 03/08/2020 0935   GFRNONAA 70 10/22/2019 0958   GFRAA 82 10/22/2019 0958    Diabetic Labs (most recent): Lab Results  Component Value Date   HGBA1C 5.8 (A) 09/08/2019   HGBA1C 5.9 01/15/2019   HGBA1C 5.9 01/15/2019     Lipid Panel ( most recent) Lipid Panel     Component Value Date/Time   CHOL 194 10/22/2019 0958   TRIG 68 10/22/2019 0958   HDL 64 10/22/2019 0958   CHOLHDL 3.0 10/22/2019 0958   VLDL 15 11/24/2016 1018   LDLCALC 114 (H) 10/22/2019 0958     Lab Results  Component Value Date   TSH 1.48 03/08/2020   TSH 0.78 09/08/2019   TSH 0.34 (L) 02/27/2019   TSH 0.52 01/15/2019   TSH 0.58 08/16/2017   TSH 0.22 (L) 02/19/2017   TSH 0.28 (L) 12/15/2016   FREET4 1.4 03/08/2020   FREET4 1.3 09/08/2019   FREET4 1.5 02/27/2019       ASSESSMENT: 1. Hypothyroidism- RAI induced 2.  Prediabetes  PLAN:   Her previsit thyroid function tests are consistent with appropriate replacement.  She is advised to continue Synthroid 112 mcg p.o.  daily before breakfast.     - We discussed about the correct intake of her thyroid hormone, on empty stomach at fasting, with water, separated by at least 30 minutes from breakfast and other medications,  and separated by more than 4 hours from calcium, iron, multivitamins, acid reflux medications (PPIs). -Patient is made aware of the fact that thyroid hormone replacement is needed for life, dose to be adjusted by periodic monitoring of thyroid function tests.   -Due to absence of clinical goiter, no need for thyroid ultrasound. -Prediabetes: Her most recent A1c was 5.8%, she will have repeat A1c during her next visit.  She will not need medication intervention at this time.  She is advised to maintain close follow-up with her PCP.     - Time spent on this patient care encounter:  20 minutes of which 50% was spent in  counseling and the rest reviewing  her current and  previous labs / studies and medications  doses and developing a plan for long term care. Kara [name]  participated in the discussions, expressed understanding, and voiced agreement with the above plans.  All questions were answered to her satisfaction. she is encouraged to contact clinic should she have any questions or concerns prior to her return visit.   Return in about 1 year (around 03/11/2021) for F/U with Pre-visit Labs, A1c -NV.  Glade Lloyd, MD [facility] [address] [place], [place] [address] Phone: [phone]  Fax: [phone]   03/11/2020, 11:36 AM  This note was partially dictated with voice recognition software. Similar sounding words can be transcribed inadequately or may not  be corrected upon review.

## 2020-04-14 ENCOUNTER — Other Ambulatory Visit: Payer: Self-pay

## 2020-04-14 ENCOUNTER — Encounter: Payer: Self-pay | Admitting: Family Medicine

## 2020-04-14 ENCOUNTER — Ambulatory Visit (INDEPENDENT_AMBULATORY_CARE_PROVIDER_SITE_OTHER): Payer: Medicare Other | Admitting: Family Medicine

## 2020-04-14 VITALS — BP 128/70 | HR 80 | Temp 97.4°F | Ht 66.0 in | Wt 222.0 lb

## 2020-04-14 DIAGNOSIS — U099 Post covid-19 condition, unspecified: Secondary | ICD-10-CM

## 2020-04-14 DIAGNOSIS — R5382 Chronic fatigue, unspecified: Secondary | ICD-10-CM | POA: Diagnosis not present

## 2020-04-14 DIAGNOSIS — G9332 Myalgic encephalomyelitis/chronic fatigue syndrome: Secondary | ICD-10-CM

## 2020-04-14 DIAGNOSIS — G479 Sleep disorder, unspecified: Secondary | ICD-10-CM

## 2020-04-14 DIAGNOSIS — R600 Localized edema: Secondary | ICD-10-CM | POA: Diagnosis not present

## 2020-04-14 MED ORDER — HYDROCHLOROTHIAZIDE 12.5 MG PO TABS: 12.5000 mg | ORAL_TABLET | Freq: Every day | ORAL | 0 refills | Status: DC

## 2020-04-14 NOTE — Patient Instructions (Signed)
  I appreciate the opportunity to provide you with care for your health and wellness. Today we discussed: Several concerns  Follow up: 2 weeks for edema and BP follow up   No labs or referrals today  Parameter for hydrochlorothiazide:  Blood pressure take in the morning: 110/60 or higher to take the medication. Take BP 1 hour after you start to urinate. Take BP 3-4 hours after the medication.  Please record these.  Please drink fluids as normal  See sleep sheet below.  Please continue to practice social distancing to keep you, your family, and our community safe.  If you must go out, please wear a mask and practice good handwashing.  It was a pleasure to see you and I look forward to continuing to work together on your health and well-being. Please do not hesitate to call the office if you need care or have questions about your care.  Have a wonderful day. With Gratitude, Cherly Beach, DNP, AGNP-BC  Aadults need around (7-9 hours each night) of sleep.  11 Tips to Follow: 1. No caffeine after 3pm: Avoid beverages with caffeine (soda, tea, energy drinks, etc.) especially after 3pm.  2. Don't go to bed hungry: Have your evening meal at least 3 hrs. before going to sleep. It's fine to have a small bedtime snack such as a glass of milk and a few crackers but don't have a big meal.  3. Have a nightly routine before bed: Plan on "winding down" before you go to sleep. Begin relaxing about 1 hour before you go to bed. Try doing a quiet activity such as listening to calming music, reading a book or meditating.  4. Turn off the TV and ALL electronics including video games, tablets, laptops, etc. 1 hour before sleep, and keep them out of the bedroom.  5. Turn off your cell phone and all notifications (new email and text alerts) or even better, leave your phone outside your room while you sleep. Studies have shown that a part of your brain continues to respond to certain lights and sounds  even while you're still asleep.  6. Make your bedroom quiet, dark and cool. If you can't control the noise, try wearing earplugs or using a fan to block out other sounds.  7. Practice relaxation techniques. Try reading a book or meditating or drain your brain by writing a list of what you need to do the next day.  8. Don't nap unless you feel sick: you'll have a better night's sleep.  9. Don't smoke, or quit if you do. Nicotine, alcohol, and marijuana can all keep you awake. Talk to your health care provider if you need help with substance use.  10. Most importantly, wake up at the same time every day (or within 1 hour of your usual wake up time) EVEN on the weekends. A regular wake up time promotes sleep hygiene and prevents sleep problems.  11. Reduce exposure to bright light in the last three hours of the day before going to sleep.  Maintaining good sleep hygiene and having good sleep habits lower your risk of developing sleep problems. Getting better sleep can also improve your concentration and alertness. Try the simple steps in this guide. If you still have trouble getting enough rest, make an appointment with your health care provider.

## 2020-04-14 NOTE — Progress Notes (Signed)
Subjective:  Patient ID: Kara [name], female    DOB: Nov 13, 1945  Age: 74 y.o. MRN: [id number]  CC:  Chief Complaint  Patient presents with  . Edema    ongoing for ahwile now but it is worsening  . Fatigue    had Covid in Sept, this has not gotten any better since that      HPI  HPI   Ms Heslin is a pleasant 74 year old female patient of mine who presents today after having ongoing fatigue since having Covid in September that she does not feel is getting much better in addition to her edema of both legs being worse.  History as stated below.  Edema: She has had leg swelling in the past.  She has had wraps before she is wearing her compression of 20 to 7mmHg today in the office.  But still has significant edema from the foot up to the knee.  She denies having any changes in her diet.  She does report that she is not moving around as much as she was moving but she still trying to get 5000+ steps a day.  In the past she has not been started on any diuretics secondary to her blood pressure not holding very well.  Blood pressure is 128/70 today.  In conversation she is willing to try low-dose hydrochlorothiazide with blood pressure parameters.  Fatigue: She had Covid back in September and reports that she was very fatigued then but does not feel that it is gotten much better.  She reports that she does sleep but not very well.  And she is never slept very well.  She does watch TV prior to going to bed.  Does not like taking any supplements for sleep.  Has melatonin at home but has not tried it yet.  Does take daily vitamin D on a regular basis along with other supplements to see if this would be helpful but she reports that they have not changed anything much.  She denies having any excessive stress or anxiety or depression.  Today in the office she denies having any chest pain, palpitations, cough, shortness of breath, sinus infection-like symptoms, headaches or dizziness.   Today  patient denies signs and symptoms of COVID 19 infection including fever, chills, cough, shortness of breath, and headache. Past Medical, Surgical, Social History, Allergies, and Medications have been Reviewed.   Past Medical History:  Diagnosis Date  . Allergy    food allergies, drug allergies  . Anemia   . Anxiety   . Arthritis   . Breast pain, left 02/25/2018  . Cataract   . Change in bowel function 04/09/2018  . Chronic pain of left knee 09/03/2019  . COVID-19 virus infection 01/14/2020  . Dyspepsia 09/29/2016  . Family history of breast cancer   . Family history of cancer 08/23/2016   Aunt is [employer]  . Family history of prostate cancer   . Genetic testing 08/11/2019   Negative genetic testing on the [facility] 9-gene STAT panel.  The STAT Breast cancer panel offered by [facility] includes sequencing and rearrangement analysis for the following 9 genes:  ATM, BRCA1, BRCA2, CDH1, CHEK2, PALB2, PTEN, STK11 and TP53.   The report date is August 09, 2019.  Marland Kitchen GERD (gastroesophageal reflux disease)   . H/O swallowed foreign body 11/20/2018  . Headache in back of head 12/31/2018  . Hip pain, acute, left 03/27/2019  . HLD (hyperlipidemia) 08/23/2016  . Hyperlipidemia   . Hypothyroid 08/14/2016  .  Insomnia due to stress 12/23/2018  . 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 Perimenopausal vasomotor symptoms 02/11/2016  . Postmenopausal atrophic vaginitis 11/09/2015  . Pre-diabetes    no meds  . Prediabetes 08/30/2017  . Thyroid disease    Grave's  . Vaginal discharge 01/16/2019  . Weight loss, unintentional 03/27/2019    Current Meds  Medication Sig  . acetaminophen (TYLENOL) 500 MG tablet Take 500 mg by mouth every 6 (six) hours as needed.  Marland Kitchen albuterol (VENTOLIN HFA) 108 (90 Base) MCG/ACT inhaler Inhale 1-2 puffs into the lungs every 6 (six) hours as needed for wheezing or shortness of breath.  .  Ascorbic Acid (VITAMIN C WITH ROSE HIPS) 500 MG tablet Take 500 mg by mouth daily.  . cholecalciferol (VITAMIN D3) 25 MCG (1000 UNIT) tablet Take 1,000 Units by mouth daily.  Marland Kitchen EPINEPHrine 0.3 mg/0.3 mL IJ SOAJ injection Inject 0.3 mLs (0.3 mg total) into the skin as needed.  . Multiple Vitamin (MULTIVITAMIN) tablet Take 1 tablet by mouth daily.  Marland Kitchen SYNTHROID 112 MCG tablet TAKE 1 TABLET(112 MCG) BY MOUTH DAILY    ROS:  Review of Systems  Constitutional: Positive for malaise/fatigue.  HENT: Negative.   Eyes: Negative.   Respiratory: Negative.   Cardiovascular: Positive for leg swelling.  Gastrointestinal: Negative.   Genitourinary: Negative.   Musculoskeletal: Negative.   Skin: Negative.   Neurological: Negative.   Endo/Heme/Allergies: Negative.   Psychiatric/Behavioral: Negative.      Objective:   Today's Vitals: BP 128/70 (BP Location: Right Arm, Patient Position: Sitting, Cuff Size: Normal)   Pulse 80   Temp (!) 97.4 F (36.3 C) (Temporal)   Ht 5\' 6"  (1.676 m)   Wt 222 lb (100.7 kg)   SpO2 98%   BMI 35.83 kg/m  Vitals with BMI 04/14/2020 03/11/2020 02/19/2020  Height 5\' 6"  5\' 6"  5\' 6"   Weight 222 lbs 220 lbs 220 lbs 4 oz  BMI 35.85 35.53 35.57  Systolic 128 107 04/20/2020  Diastolic 70 70 59  Pulse 80 74 72     Physical Exam Vitals and nursing note reviewed.  Constitutional:      Appearance: Normal appearance. She is well-developed and well-groomed. She is obese.  HENT:     Head: Normocephalic and atraumatic.     Right Ear: External ear normal.     Left Ear: External ear normal.     Mouth/Throat:     Comments: Mask in place  Eyes:     General:        Right eye: No discharge.        Left eye: No discharge.     Conjunctiva/sclera: Conjunctivae normal.  Cardiovascular:     Rate and Rhythm: Normal rate and regular rhythm.     Heart sounds: Normal heart sounds.  Pulmonary:     Effort: Pulmonary effort is normal.     Breath sounds: Normal breath sounds.    Musculoskeletal:        General: Normal range of motion.     Cervical back: Normal range of motion and neck supple.     Right lower leg: 2+ Pitting Edema present.     Left lower leg: 2+ Pitting Edema present.  Skin:    General: Skin is warm.  Neurological:     General: No focal deficit present.     Mental Status: She is alert and oriented to person, place, and  time.  Psychiatric:        Attention and Perception: Attention normal.        Mood and Affect: Mood normal.        Speech: Speech normal.        Behavior: Behavior normal. Behavior is cooperative.        Thought Content: Thought content normal.        Cognition and Memory: Cognition normal.        Judgment: Judgment normal.     Assessment   1. Edema of both lower extremities   2. Post-COVID chronic fatigue   3. Sleep difficulties     Tests ordered No orders of the defined types were placed in this encounter.    Plan: Please see assessment and plan per problem list above.   Meds ordered this encounter  Medications  . hydrochlorothiazide (HYDRODIURIL) 12.5 MG tablet    Sig: Take 1 tablet (12.5 mg total) by mouth daily.    Dispense:  7 tablet    Refill:  0    Order Specific Question:   Supervising Provider    Answer:   Jacklynn Bue    Patient to follow-up in 04/28/2020   Note: This dictation was prepared with Dragon dictation along with smaller phrase technology. Similar sounding words can be transcribed inadequately or may not be corrected upon review. Any transcriptional errors that result from this process are unintentional.      Perlie Mayo, NP

## 2020-04-15 DIAGNOSIS — R600 Localized edema: Secondary | ICD-10-CM | POA: Insufficient documentation

## 2020-04-15 DIAGNOSIS — R5382 Chronic fatigue, unspecified: Secondary | ICD-10-CM | POA: Insufficient documentation

## 2020-04-15 DIAGNOSIS — G479 Sleep disorder, unspecified: Secondary | ICD-10-CM | POA: Insufficient documentation

## 2020-04-15 DIAGNOSIS — G9332 Myalgic encephalomyelitis/chronic fatigue syndrome: Secondary | ICD-10-CM | POA: Insufficient documentation

## 2020-04-15 NOTE — Assessment & Plan Note (Signed)
Post Covid chronic fatigue.  Also has sleep difficulties.  Slightly overlapping.  I have given her sleep worksheet.  Encouraged the use of increased vitamin D supplement.  Reviewed melatonin use as well.  Hopefully that getting her sleep back on track would help with the chronic fatigue.  Education on chronic fatigue post Covid being a side effect for some individuals that does not go away very easily and or takes a very long time to resolve if it does resolve. Reviewed side effects, risks and benefits of medication.   Patient acknowledged agreement and understanding of the plan.

## 2020-04-15 NOTE — Assessment & Plan Note (Signed)
Continue compression, low-salt diet, hydrochlorothiazide with parameters set trial over the next week or 2.  Follow-up in 2 weeks to see how she tolerated that with her blood pressure.  And then proceed to decide if this is something that she can take on a regular basis.  Otherwise doing wrappings would probably be the next best step.  Reviewed side effects, risks and benefits of medication.   Patient acknowledged agreement and understanding of the plan.

## 2020-04-21 DIAGNOSIS — Z23 Encounter for immunization: Secondary | ICD-10-CM | POA: Diagnosis not present

## 2020-04-28 ENCOUNTER — Ambulatory Visit: Payer: Medicare Other | Admitting: Family Medicine

## 2020-04-29 ENCOUNTER — Ambulatory Visit (INDEPENDENT_AMBULATORY_CARE_PROVIDER_SITE_OTHER): Payer: Medicare Other | Admitting: Family Medicine

## 2020-04-29 ENCOUNTER — Encounter: Payer: Self-pay | Admitting: Family Medicine

## 2020-04-29 ENCOUNTER — Other Ambulatory Visit: Payer: Self-pay

## 2020-04-29 VITALS — BP 118/68 | HR 100 | Temp 98.0°F | Ht 66.0 in | Wt 225.0 lb

## 2020-04-29 DIAGNOSIS — G9332 Myalgic encephalomyelitis/chronic fatigue syndrome: Secondary | ICD-10-CM

## 2020-04-29 DIAGNOSIS — U099 Post covid-19 condition, unspecified: Secondary | ICD-10-CM

## 2020-04-29 DIAGNOSIS — R5382 Chronic fatigue, unspecified: Secondary | ICD-10-CM | POA: Diagnosis not present

## 2020-04-29 DIAGNOSIS — R600 Localized edema: Secondary | ICD-10-CM | POA: Diagnosis not present

## 2020-04-29 DIAGNOSIS — R002 Palpitations: Secondary | ICD-10-CM | POA: Insufficient documentation

## 2020-04-29 DIAGNOSIS — R0602 Shortness of breath: Secondary | ICD-10-CM | POA: Diagnosis not present

## 2020-04-29 MED ORDER — HYDROCHLOROTHIAZIDE 12.5 MG PO TABS: 12.5000 mg | ORAL_TABLET | Freq: Every day | ORAL | 0 refills | Status: DC

## 2020-04-29 NOTE — Patient Instructions (Signed)
  I appreciate the opportunity to provide you with care for your health and wellness. Today we discussed: Several concerns  Follow up: ____________  No labs or referrals today Order: Echo ____________  Pending Echo we will see if cardiology is the next step.  Parameter for hydrochlorothiazide:  Blood pressure take in the morning: 110/60 or higher to take the medication. Take BP 1 hour after you start to urinate. Take BP 3-4 hours after the medication.  Please record these.  Please drink fluids as normal  See sleep sheet below.  MERRY CHRISTMAS AND HAPPY NEW YEAR!  Please continue to practice social distancing to keep you, your family, and our community safe.  If you must go out, please wear a mask and practice good handwashing.  It was a pleasure to see you and I look forward to continuing to work together on your health and well-being. Please do not hesitate to call the office if you need care or have questions about your care.  Have a wonderful day. With Gratitude, Cherly Beach, DNP, AGNP-BC  Aadults need around (7-9 hours each night) of sleep.  11 Tips to Follow: 1. No caffeine after 3pm: Avoid beverages with caffeine (soda, tea, energy drinks, etc.) especially after 3pm.  2. Don't go to bed hungry: Have your evening meal at least 3 hrs. before going to sleep. It's fine to have a small bedtime snack such as a glass of milk and a few crackers but don't have a big meal.  3. Have a nightly routine before bed: Plan on "winding down" before you go to sleep. Begin relaxing about 1 hour before you go to bed. Try doing a quiet activity such as listening to calming music, reading a book or meditating.  4. Turn off the TV and ALL electronics including video games, tablets, laptops, etc. 1 hour before sleep, and keep them out of the bedroom.  5. Turn off your cell phone and all notifications (new email and text alerts) or even better, leave your phone outside your  room while you sleep. Studies have shown that a part of your brain continues to respond to certain lights and sounds even while you're still asleep.  6. Make your bedroom quiet, dark and cool. If you can't control the noise, try wearing earplugs or using a fan to block out other sounds.  7. Practice relaxation techniques. Try reading a book or meditating or drain your brain by writing a list of what you need to do the next day.  8. Don't nap unless you feel sick: you'll have a better night's sleep.  9. Don't smoke, or quit if you do. Nicotine, alcohol, and marijuana can all keep you awake. Talk to your health care provider if you need help with substance use.  10. Most importantly, wake up at the same time every day (or within 1 hour of your usual wake up time) EVEN on the weekends. A regular wake up time promotes sleep hygiene and prevents sleep problems.  11. Reduce exposure to bright light in the last three hours of the day before going to sleep.  Maintaining good sleep hygiene and having good sleep habits lower your risk of developing sleep problems. Getting better sleep can also improve your concentration and alertness. Try the simple steps in this guide. If you still have trouble getting enough rest, make an appointment with your health care provider.

## 2020-04-29 NOTE — Assessment & Plan Note (Signed)
I am concerned that she is deconditioned with post covid fatigue.  I am r/u HF Hopefully she will tolerate the [place] and see improvement in her legs. Education was again provided on post covid fatigue, she seems to not want to think that is what is going on, however she was not feeling like this prior per her.

## 2020-04-29 NOTE — Assessment & Plan Note (Signed)
I have encouraged her to start increasing her walking, low salt diet, restart HTCZ with parameters of BP to see if pulling fluid helps with this.  Will follow up post ECHO  That was ordered to r/u HF.  I am concerned that she is deconditioned with post covid fatigue.

## 2020-04-29 NOTE — Assessment & Plan Note (Signed)
I have encouraged her to continue compression, walking, low salt diet, restart HTCZ with parameters of BP. Will follow up post ECHO  That was ordered to r/u HF.  Leg wrappings might be the only way to go and exercise.

## 2020-04-29 NOTE — Progress Notes (Signed)
Subjective:  Patient ID: Kara [name], female    DOB: 1945/07/02  Age: 74 y.o. MRN: [id number]  CC:  Chief Complaint  Patient presents with  . Edema    F/u       HPI  HPI Kara [name] is a pleasant 74 year old female patient of mine who presents today for follow up on ongoing fatigue since having Covid in September that she does not feel is getting much better in addition to her edema of both legs being worse.  History as stated below.  Edema: Reports she had a strange feeling in her chest when she tool the hydrochlorothiazide so after two days she stopped it. She also reports she does not think if pulled off a lot of fluid, like she thought it would.  She has had leg swelling in the past. She reports having wraps before and that she is wearing 20-30 mmHg compression when she wears the. She has edema from foot to knee. She denies changes in diet with salt intake, but reports weight changes. She does report that she is not moving around as much as she was moving but she still trying to get 5000+ steps a day.  In the past she has not been started on any diuretics secondary to her blood pressure not holding very well.  Blood pressure is 118/68 today.  In conversation she is willing to retry low-dose hydrochlorothiazide with blood pressure parameters. She is aware this is a low dose med and she might not get the "impact" she wants.   Fatigue: She had Covid back in September and reports that she was very fatigued then but does not feel that it is gotten much better.  She reports that she does sleep but not very well as per her last office visit.  And she is never slept very well.  She does watch TV prior to going to bed.  Does not like taking any supplements for sleep.  Does take daily vitamin D on a regular basis along with other supplements to see if this would be helpful but she reports today that she thinks the Vit D has helped alittle. She does not feel this is all post covid fatigue, however  she does know it worsen after being sick.  She denies having any excessive stress or anxiety or depression.  Of note at times she reports palpitations that she has had off and on. She states shortness of breath is still present as well at times. She denies worsen of these. She denies having chest pain, cough, headaches, or dizziness.  Today patient denies signs and symptoms of COVID 19 infection including fever, chills, cough, shortness of breath, and headache. Past Medical, Surgical, Social History, Allergies, and Medications have been Reviewed.   Past Medical History:  Diagnosis Date  . Allergy    food allergies, drug allergies  . Anemia   . Anxiety   . Arthritis   . Breast pain, left 02/25/2018  . Cataract   . Change in bowel function 04/09/2018  . Chronic pain of left knee 09/03/2019  . Colon polyps 08/23/2016  . COVID-19 virus infection 01/14/2020  . Dyspepsia 09/29/2016  . Family history of breast cancer   . Family history of cancer 08/23/2016   Aunt is [employer]  . Family history of prostate cancer   . Genetic testing 08/11/2019   Negative genetic testing on the [facility] 9-gene STAT panel.  The STAT Breast cancer panel offered by [facility] includes sequencing and  rearrangement analysis for the following 9 genes:  ATM, BRCA1, BRCA2, CDH1, CHEK2, PALB2, PTEN, STK11 and TP53.   The report date is August 09, 2019.  Marland Kitchen GERD (gastroesophageal reflux disease)   . H/O swallowed foreign body 11/20/2018  . Headache in back of head 12/31/2018  . Hip pain, acute, left 03/27/2019  . HLD (hyperlipidemia) 08/23/2016  . Hyperlipidemia   . Hypothyroid 08/14/2016  . Insomnia due to stress 12/23/2018  . Malignant neoplasm of upper-outer quadrant of left breast in female, estrogen receptor negative ([place]) 06/11/2019   DCIS left breast  . Perimenopausal vasomotor symptoms 02/11/2016  . Postmenopausal atrophic vaginitis 11/09/2015  . Pre-diabetes    no meds  . Prediabetes 08/30/2017  . Thyroid disease     Grave's  . Vaginal discharge 01/16/2019  . Weight loss, unintentional 03/27/2019    Current Meds  Medication Sig  . acetaminophen (TYLENOL) 500 MG tablet Take 500 mg by mouth every 6 (six) hours as needed.  Marland Kitchen albuterol (VENTOLIN HFA) 108 (90 Base) MCG/ACT inhaler Inhale 1-2 puffs into the lungs every 6 (six) hours as needed for wheezing or shortness of breath.  . Ascorbic Acid (VITAMIN C WITH ROSE HIPS) 500 MG tablet Take 500 mg by mouth daily.  . cholecalciferol (VITAMIN D3) 25 MCG (1000 UNIT) tablet Take 1,000 Units by mouth daily.  Marland Kitchen EPINEPHrine 0.3 mg/0.3 mL IJ SOAJ injection Inject 0.3 mLs (0.3 mg total) into the skin as needed.  . Multiple Vitamin (MULTIVITAMIN) tablet Take 1 tablet by mouth daily.  Marland Kitchen SYNTHROID 112 MCG tablet TAKE 1 TABLET(112 MCG) BY MOUTH DAILY    ROS:  Review of Systems  Constitutional: Positive for malaise/fatigue.  HENT: Negative.   Eyes: Negative.   Respiratory: Negative.   Cardiovascular: Positive for palpitations and leg swelling.  Gastrointestinal: Negative.   Genitourinary: Negative.   Musculoskeletal: Negative.   Skin: Negative.   Neurological: Negative.   Endo/Heme/Allergies: Negative.   Psychiatric/Behavioral: Negative.      Objective:   Today's Vitals: BP 118/68 (BP Location: Right Arm, Patient Position: Sitting, Cuff Size: Normal)   Pulse 100   Temp 98 F (36.7 C) (Temporal)   Ht _0  (1.676 m)   Wt 225 lb (102.1 kg)   SpO2 98%   BMI 36.32 kg/m  Vitals with BMI 04/29/2020 04/14/2020 03/11/2020  Height _1  _2  _3   Weight 225 lbs 222 lbs 220 lbs  BMI 36.33 41.96 22.29  Systolic 798 921 194  Diastolic 68 70 70  Pulse 174 80 74     Physical Exam Vitals and nursing note reviewed.  Constitutional:      Appearance: Normal appearance. She is well-developed and well-groomed. She is obese.  HENT:     Head: Normocephalic and atraumatic.     Right Ear: External ear normal.     Left Ear: External ear normal.     Mouth/Throat:      Comments: Mask in place  Eyes:     General:        Right eye: No discharge.        Left eye: No discharge.     Conjunctiva/sclera: Conjunctivae normal.  Cardiovascular:     Rate and Rhythm: Normal rate and regular rhythm.     Pulses: Normal pulses.     Heart sounds: Normal heart sounds.  Pulmonary:     Effort: Pulmonary effort is normal.     Breath sounds: Normal breath sounds.  Musculoskeletal:  General: Normal range of motion.     Cervical back: Normal range of motion and neck supple.     Right lower leg: Edema present.     Left lower leg: Edema present.  Skin:    General: Skin is warm.  Neurological:     General: No focal deficit present.     Mental Status: She is alert and oriented to person, place, and time.  Psychiatric:        Attention and Perception: Attention normal.        Mood and Affect: Mood normal.        Speech: Speech normal.        Behavior: Behavior normal. Behavior is cooperative.        Thought Content: Thought content normal.        Cognition and Memory: Cognition normal.        Judgment: Judgment normal.    Assessment   1. Edema of both lower extremities   2. Shortness of breath   3. Palpitation   4. Post-COVID chronic fatigue     Tests ordered Orders Placed This Encounter  Procedures  . ECHOCARDIOGRAM COMPLETE     Plan: Please see assessment and plan per problem list above.   Meds ordered this encounter  Medications  . hydrochlorothiazide (HYDRODIURIL) 12.5 MG tablet    Sig: Take 1 tablet (12.5 mg total) by mouth daily.    Dispense:  7 tablet    Refill:  0    Order Specific Question:   Supervising Provider    Answer:   Jacklynn Bue    Patient to follow-up in 05/11/2020   Perlie Mayo, NP

## 2020-04-29 NOTE — Assessment & Plan Note (Signed)
Did not opening report these until asking about symptoms. PE unremarkable in office today. Echo ordered to r/u HF, if needed referral to cards will be made.

## 2020-05-04 ENCOUNTER — Ambulatory Visit (HOSPITAL_COMMUNITY)
Admission: RE | Admit: 2020-05-04 | Discharge: 2020-05-04 | Disposition: A | Discharge status: Home / Self Care | Payer: Medicare Other | Source: Ambulatory Visit | Attending: Family Medicine | Admitting: Family Medicine

## 2020-05-04 ENCOUNTER — Other Ambulatory Visit: Payer: Self-pay

## 2020-05-04 DIAGNOSIS — R600 Localized edema: Secondary | ICD-10-CM | POA: Diagnosis not present

## 2020-05-04 DIAGNOSIS — R0602 Shortness of breath: Secondary | ICD-10-CM

## 2020-05-04 DIAGNOSIS — R002 Palpitations: Secondary | ICD-10-CM

## 2020-05-04 LAB — ECHOCARDIOGRAM COMPLETE
Area-P 1/2: 2.97 cm2
S' Lateral: 2.51 cm

## 2020-05-04 NOTE — Progress Notes (Signed)
*  PRELIMINARY RESULTS* Echocardiogram 2D Echocardiogram has been performed.  Kara [name] 05/04/2020, 12:41 PM

## 2020-05-11 ENCOUNTER — Ambulatory Visit: Payer: Medicare Other | Admitting: Family Medicine

## 2020-05-14 ENCOUNTER — Encounter: Payer: Self-pay | Admitting: Family Medicine

## 2020-05-14 DIAGNOSIS — H40021 Open angle with borderline findings, high risk, right eye: Secondary | ICD-10-CM | POA: Diagnosis not present

## 2020-05-14 DIAGNOSIS — H2513 Age-related nuclear cataract, bilateral: Secondary | ICD-10-CM | POA: Diagnosis not present

## 2020-05-14 DIAGNOSIS — H401221 Low-tension glaucoma, left eye, mild stage: Secondary | ICD-10-CM | POA: Diagnosis not present

## 2020-05-14 DIAGNOSIS — R7309 Other abnormal glucose: Secondary | ICD-10-CM | POA: Diagnosis not present

## 2020-05-21 ENCOUNTER — Other Ambulatory Visit: Payer: Self-pay | Admitting: *Deleted

## 2020-05-21 DIAGNOSIS — R600 Localized edema: Secondary | ICD-10-CM

## 2020-05-21 MED ORDER — HYDROCHLOROTHIAZIDE 12.5 MG PO TABS: 12.5000 mg | ORAL_TABLET | Freq: Every day | ORAL | 0 refills | Status: DC

## 2020-05-26 ENCOUNTER — Other Ambulatory Visit: Payer: Self-pay

## 2020-05-26 ENCOUNTER — Ambulatory Visit
Admission: RE | Admit: 2020-05-26 | Discharge: 2020-05-26 | Disposition: A | Discharge status: Home / Self Care | Payer: Medicare Other | Source: Ambulatory Visit | Attending: Adult Health | Admitting: Adult Health

## 2020-05-26 DIAGNOSIS — D0512 Intraductal carcinoma in situ of left breast: Secondary | ICD-10-CM

## 2020-05-26 DIAGNOSIS — Z853 Personal history of malignant neoplasm of breast: Secondary | ICD-10-CM | POA: Diagnosis not present

## 2020-05-26 DIAGNOSIS — R922 Inconclusive mammogram: Secondary | ICD-10-CM | POA: Diagnosis not present

## 2020-05-31 ENCOUNTER — Telehealth: Payer: Self-pay | Admitting: Hematology and Oncology

## 2020-05-31 ENCOUNTER — Encounter: Payer: Self-pay | Admitting: Hematology and Oncology

## 2020-05-31 NOTE — Telephone Encounter (Signed)
Received a sch msg to r/s Ms. Knisley's 1/18 appt. She has been cld and rescheduled to see Dr. Lindi Adie on 1/31 at 11am.

## 2020-05-31 NOTE — Assessment & Plan Note (Deleted)
06/11/2019:Screening mammogram showed left breast calcifications. Diagnostic mammogram showed left breast calcifications spanning 1.1cm. Biopsy showed DCIS with apocrine features, calcifications, and necrosis, high grade, ER/PR negative.  07/08/2019:Left lumpectomy Marlou Starks): high grade DCIS, 1.5cm, clear margins.   Recommendation: adjuvant radiation therapy followed by surveillance. Surveillance: 05/26/20: Mammogram: Benign, density Cat B  RTC in 1 year

## 2020-06-01 ENCOUNTER — Inpatient Hospital Stay: Payer: Medicare Other | Admitting: Hematology and Oncology

## 2020-06-13 NOTE — Progress Notes (Signed)
Patient Care Team: Perlie Mayo, NP as PCP - General (Family Medicine) Gala Romney Cristopher Estimable, MD as Consulting Physician (Gastroenterology) Jovita Kussmaul, MD as Consulting Physician (General Surgery) Nicholas Lose, MD as Consulting Physician (Hematology and Oncology) Gery Pray, MD as Consulting Physician (Radiation Oncology)  DIAGNOSIS:    ICD-10-CM   1. Ductal carcinoma in situ (DCIS) of left breast  D05.12     SUMMARY OF ONCOLOGIC HISTORY: Oncology History  Ductal carcinoma in situ (DCIS) of left breast  06/04/2019 Cancer Staging   Staging form: Breast, AJCC 8th Edition - Clinical stage from 06/04/2019: Stage 0 (cTis (DCIS), cN0, cM0, ER-, PR-)   06/11/2019 Initial Diagnosis   Screening mammogram showed left breast calcifications. Diagnostic mammogram showed left breast calcifications spanning 1.1cm. Biopsy showed DCIS with apocrine features, calcifications, and necrosis, high grade, ER/PR negative.   07/08/2019 Surgery   Left lumpectomy Marlou Starks) [phone]): high grade DCIS, 1.5cm, clear margins.   07/08/2019 Cancer Staging   Staging form: Breast, AJCC 8th Edition - Pathologic stage from 07/08/2019: Stage 0 (pTis (DCIS), pN0, cM0, ER-, PR-)   08/09/2019 Genetic Testing   Negative genetic testing on the [facility] 9-gene STAT panel.  The STAT Breast cancer panel offered by [facility] includes sequencing and rearrangement analysis for the following 9 genes:  ATM, BRCA1, BRCA2, CDH1, CHEK2, PALB2, PTEN, STK11 and TP53.   The report date is August 09, 2019.   08/11/2019 - 09/01/2019 Radiation Therapy   The patient initially received a dose of 42.72 Gy in 16 fractions to the breast using whole-breast tangent fields. This was delivered using a 3-D conformal technique. No boost. The total dose was 42.72 Gy.     CHIEF COMPLIANT: Follow-up of left breast DCIS  INTERVAL HISTORY: Kara [name] is a 75 y.o. with above-mentioned history of left breast DCIS who underwent a left lumpectomy,  radiation, and is currently on surveillance. She presents to the clinic today for follow-up.   ALLERGIES:  is allergic to prednisone, other, and naproxen.  MEDICATIONS:  Current Outpatient Medications  Medication Sig Dispense Refill  . acetaminophen (TYLENOL) 500 MG tablet Take 500 mg by mouth every 6 (six) hours as needed.    Marland Kitchen albuterol (VENTOLIN HFA) 108 (90 Base) MCG/ACT inhaler Inhale 1-2 puffs into the lungs every 6 (six) hours as needed for wheezing or shortness of breath. 8 g 1  . Ascorbic Acid (VITAMIN C WITH ROSE HIPS) 500 MG tablet Take 500 mg by mouth daily.    . cholecalciferol (VITAMIN D3) 25 MCG (1000 UNIT) tablet Take 1,000 Units by mouth daily.    Marland Kitchen EPINEPHrine 0.3 mg/0.3 mL IJ SOAJ injection Inject 0.3 mLs (0.3 mg total) into the skin as needed. 2 each 2  . hydrochlorothiazide (HYDRODIURIL) 12.5 MG tablet Take 1 tablet (12.5 mg total) by mouth daily. 7 tablet 0  . Multiple Vitamin (MULTIVITAMIN) tablet Take 1 tablet by mouth daily.    Marland Kitchen SYNTHROID 112 MCG tablet TAKE 1 TABLET(112 MCG) BY MOUTH DAILY 90 tablet 3   No current facility-administered medications for this visit.    PHYSICAL EXAMINATION: ECOG PERFORMANCE STATUS: 1 - Symptomatic but completely ambulatory  Vitals:   06/14/20 1048  BP: 119/63  Pulse: 80  Resp: 18  Temp: (!) 97.2 F (36.2 C)  SpO2: 100%   Filed Weights   06/14/20 1048  Weight: 229 lb (103.9 kg)    BREAST: No palpable masses or nodules in either right or left breasts. No palpable axillary supraclavicular or infraclavicular adenopathy  no breast tenderness or nipple discharge. (exam performed in the presence of a chaperone)  LABORATORY DATA:  I have reviewed the data as listed CMP Latest Ref Rng & Units 03/08/2020 10/22/2019 04/07/2019  Glucose 65 - 99 mg/dL 107(H) 113(H) 103(H)  BUN 7 - 25 mg/dL $Remove'19 17 10  'LFeoVhA$ Creatinine 0.60 - 0.93 mg/dL 0.75 0.83 0.74  Sodium 135 - 146 mmol/L 139 139 140  Potassium 3.5 - 5.3 mmol/L 4.3 4.5 4.2  Chloride  98 - 110 mmol/L 109 106 106  CO2 20 - 32 mmol/L $RemoveB'25 28 26  'RaxuqVok$ Calcium 8.6 - 10.4 mg/dL 9.3 9.4 9.2  Total Protein 6.1 - 8.1 g/dL 6.8 6.8 6.5  Total Bilirubin 0.2 - 1.2 mg/dL 0.6 0.5 0.5  Alkaline Phos 38 - 126 U/L - - -  AST 10 - 35 U/L $Remo'18 28 17  'IkKRr$ ALT 6 - 29 U/L 15 30(H) 18    Lab Results  Component Value Date   WBC 5.3 10/22/2019   HGB 13.8 10/22/2019   HCT 43.5 10/22/2019   MCV 84.6 10/22/2019   PLT 189 10/22/2019   NEUTROABS 3,419 04/07/2019    ASSESSMENT & PLAN:  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:Screening mammogram showed left breast calcifications. Diagnostic mammogram showed left breast calcifications spanning 1.1cm. Biopsy showed DCIS with apocrine features, calcifications, and necrosis, high grade, ER/PR negative.  07/08/2019:Left lumpectomy Marlou Starks): high grade DCIS, 1.5cm, clear margins. 09/01/2019: Completed adjuvant radiation  Current treatment: Surveillance Breast cancer surveillance: 1.  Breast exam 06/14/2020: Benign 2. Mammogram 05/26/2020: Benign breast density category B  Covid infection September 2021: She was dehydrated in [place] and had a bit of a tough time but she is recovered from all of that. Return to clinic in 1 year for follow-up    No orders of the defined types were placed in this encounter.  The patient has a good understanding of the overall plan. she agrees with it. she will call with any problems that may develop before the next visit here.  Total time spent: 20 mins including face to face time and time spent for planning, charting and coordination of care  Nicholas Lose, MD 06/14/2020  I, Cloyde Reams Dorshimer, am acting as scribe for Dr. Nicholas Lose.  I have reviewed the above documentation for accuracy and completeness, and I agree with the above.

## 2020-06-14 ENCOUNTER — Other Ambulatory Visit: Payer: Self-pay

## 2020-06-14 ENCOUNTER — Inpatient Hospital Stay: Payer: Medicare Other | Attending: Hematology and Oncology | Admitting: Hematology and Oncology

## 2020-06-14 DIAGNOSIS — D0512 Intraductal carcinoma in situ of left breast: Secondary | ICD-10-CM | POA: Diagnosis not present

## 2020-06-14 DIAGNOSIS — E860 Dehydration: Secondary | ICD-10-CM | POA: Diagnosis not present

## 2020-06-14 DIAGNOSIS — Z923 Personal history of irradiation: Secondary | ICD-10-CM | POA: Diagnosis not present

## 2020-06-14 DIAGNOSIS — Z79899 Other long term (current) drug therapy: Secondary | ICD-10-CM | POA: Diagnosis not present

## 2020-06-14 DIAGNOSIS — Z888 Allergy status to other drugs, medicaments and biological substances status: Secondary | ICD-10-CM | POA: Insufficient documentation

## 2020-06-14 DIAGNOSIS — Z886 Allergy status to analgesic agent status: Secondary | ICD-10-CM | POA: Diagnosis not present

## 2020-06-14 NOTE — Assessment & Plan Note (Signed)
06/11/2019:Screening mammogram showed left breast calcifications. Diagnostic mammogram showed left breast calcifications spanning 1.1cm. Biopsy showed DCIS with apocrine features, calcifications, and necrosis, high grade, ER/PR negative.  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 09/01/2019: Completed adjuvant radiation  Current treatment: Surveillance Breast cancer surveillance: 1.  Breast exam 06/14/2020: Benign 2. Mammogram 05/26/2020: Benign breast density category B  Return to clinic in 1 year for follow-up

## 2020-06-30 ENCOUNTER — Ambulatory Visit (INDEPENDENT_AMBULATORY_CARE_PROVIDER_SITE_OTHER): Payer: Medicare Other | Admitting: Internal Medicine

## 2020-06-30 ENCOUNTER — Other Ambulatory Visit: Payer: Self-pay

## 2020-06-30 ENCOUNTER — Encounter: Payer: Self-pay | Admitting: Internal Medicine

## 2020-06-30 VITALS — BP 111/71 | HR 78 | Temp 98.2°F | Resp 18 | Ht 66.0 in | Wt 229.0 lb

## 2020-06-30 DIAGNOSIS — G9332 Myalgic encephalomyelitis/chronic fatigue syndrome: Secondary | ICD-10-CM

## 2020-06-30 DIAGNOSIS — U099 Post covid-19 condition, unspecified: Secondary | ICD-10-CM

## 2020-06-30 DIAGNOSIS — E890 Postprocedural hypothyroidism: Secondary | ICD-10-CM

## 2020-06-30 DIAGNOSIS — M7989 Other specified soft tissue disorders: Secondary | ICD-10-CM

## 2020-06-30 DIAGNOSIS — R5382 Chronic fatigue, unspecified: Secondary | ICD-10-CM

## 2020-06-30 MED ORDER — FUROSEMIDE 20 MG PO TABS: 20.0000 mg | ORAL_TABLET | Freq: Every day | ORAL | 0 refills | Status: DC

## 2020-06-30 NOTE — Progress Notes (Signed)
Established Patient Office Visit  Subjective:  Patient ID: Kara [name], female    DOB: 1945/06/17  Age: 75 y.o. MRN: [id number]  CC:  Chief Complaint  Patient presents with  . Follow-up    6 month follow up still having swelling this is no better     HPI Wilburta Milbourn is a 75 year old female with PMH of GERD and hypothyroidism presents for follow up of chronic medical conditions and c/o chronic LE swelling.  She has persistent b/l LE swelling.  She has tried using compression stockings, which have helped her so far.  She was given hydrochlorothiazide to help with overt edema, but she did not benefit much.  She denies any dyspnea, orthopnea, PND.  She denies any chest pain or palpitations.  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  She denies any recent change in appetite or weight.  She has been taking Synthroid regularly for her hypothyroidism.  Past Medical History:  Diagnosis Date  . Allergy    food allergies, drug allergies  . Anemia   . Anxiety   . Arthritis   . Breast cancer ([place]) 05/2019   left breast DCIS  . Breast pain, left 02/25/2018  . Cataract   . Change in bowel function 04/09/2018  . Chronic pain of left knee 09/03/2019  . Colon polyps 08/23/2016  . COVID-19 virus infection 01/14/2020  . Dyspepsia 09/29/2016  . Family history of breast cancer   . Family history of cancer 08/23/2016   Aunt is [employer]  . Family history of prostate cancer   . Genetic testing 08/11/2019   Negative genetic testing on the [facility] 9-gene STAT panel.  The STAT Breast cancer panel offered by [facility] includes sequencing and rearrangement analysis for the following 9 genes:  ATM, BRCA1, BRCA2, CDH1, CHEK2, PALB2, PTEN, STK11 and TP53.   The report date is August 09, 2019.  Marland Kitchen GERD (gastroesophageal reflux disease)   . H/O swallowed foreign body 11/20/2018  . Headache in back of head 12/31/2018  . Hip pain, acute, left 03/27/2019  . HLD  (hyperlipidemia) 08/23/2016  . Hyperlipidemia   . Hypothyroid 08/14/2016  . Insomnia due to stress 12/23/2018  . Malignant neoplasm of upper-outer quadrant of left breast in female, estrogen receptor negative ([place]) 06/11/2019   DCIS left breast  . Perimenopausal vasomotor symptoms 02/11/2016  . Personal history of radiation therapy 2021   completed left breast radiation in April 2021  . Postmenopausal atrophic vaginitis 11/09/2015  . Pre-diabetes    no meds  . Prediabetes 08/30/2017  . Thyroid disease    Grave's  . Vaginal discharge 01/16/2019  . Weight loss, unintentional 03/27/2019    Past Surgical History:  Procedure Laterality Date  . ABDOMINAL HYSTERECTOMY     bleeding. fibroids  . BREAST BIOPSY Left 05/2019   left breast DCIS  . BREAST LUMPECTOMY Left 06/2019   DCIS  . BREAST LUMPECTOMY WITH RADIOACTIVE SEED LOCALIZATION Left 07/08/2019   Procedure: LEFT BREAST LUMPECTOMY WITH RADIOACTIVE SEED LOCALIZATION;  Surgeon: Jovita Kussmaul, MD;  Location: [facility];  Service: General;  Laterality: Left;  . CESAREAN SECTION    . CHOLECYSTECTOMY    . COLONOSCOPY  2011   [place]: two 3-4 mm sessile polyps, hyperplastic, sigmoid diverticula, TI appeared normal.   . ESOPHAGOGASTRODUODENOSCOPY  2011   [place]: non-bleeding erosive gastropathy, normal duodenum. path: unremarkable duodenum, negative H.pylori, minimal esophagitis   . TONSILLECTOMY  1975  Family History  Problem Relation Age of Onset  . Breast cancer Maternal Grandmother        dx over 35  . Prostate cancer Maternal Grandfather   . Arthritis Mother   . COPD Mother   . Breast cancer Mother 54       second at age 59  . Cancer Father        throat  . Stroke Father   . Cancer Maternal Aunt [address] Lacks, cervical cancer  . Breast cancer Maternal Aunt        dx over 23  . Breast cancer Paternal Aunt        dx under 59  . Prostate cancer Maternal Uncle        dx over 43  . Cancer Other         father's maternal cousin was Henreietta Lacks, Cervical  . Cancer Maternal Uncle        unknown cancer  . Breast cancer Paternal Aunt        dx over 63  . Colon cancer Neg Hx   . Allergic rhinitis Neg Hx   . Angioedema Neg Hx   . Asthma Neg Hx   . Atopy Neg Hx   . Eczema Neg Hx   . Urticaria Neg Hx   . Immunodeficiency Neg Hx     Social History   Socioeconomic History  . Marital status: Divorced    Spouse name: Not on file  . Number of children: 2  . Years of education: 69  . Highest education level: Not on file  Occupational History  . 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 Smoking status: Former Smoker    Quit date: 06/15/1998    Years since quitting: 22.0  . Smokeless tobacco: Never Used  . Tobacco comment: quit 2002  Vaping Use  . Vaping Use: Never used  Substance and Sexual Activity  . Alcohol use: No  . Drug use: No  . Sexual activity: Not Currently    Birth control/protection: Surgical    Comment: hyst  Other Topics Concern  . Not on file  Social History Narrative   Lives alone   2 grown children- one in [place] and one here   [place] to [place] from [employer] for aging mother who is 75 with Alzheimers      Maternal Aunt is Stage manager of the [employer] cancer call line      Enjoy: gym, time with friends       Diet: eats all food groups   Caffeine: 2 diet cokes daily    Water: 2-4 cups daily       Wears seat belt    Does not use phone while driving    Smoke and carbon Careers adviser at home   Data processing manager    No weapons    Social Determinants of Health   Financial Resource Strain: [place]   . Difficulty of Paying Living Expenses: Not hard at all  Food Insecurity: No Food Insecurity  . Worried About Charity fundraiser in the Last Year: Never true  . Ran Out of Food in the Last Year: Never true  Transportation Needs: No Transportation Needs  . Lack of  Transportation (Medical): No  . Lack of Transportation (Non-Medical): No  Physical Activity: Sufficiently Active  . Days of  Exercise per Week: 5 days  . Minutes of Exercise per Session: 30 min  Stress: Stress Concern Present  . Feeling of Stress : To some extent  Social Connections: Socially Isolated  . Frequency of Communication with Friends and Family: More than three times a week  . Frequency of Social Gatherings with Friends and Family: Once a week  . Attends Religious Services: Never  . Active Member of Clubs or Organizations: No  . Attends Archivist Meetings: Never  . Marital Status: Divorced  Human resources officer Violence: Not At Risk  . Fear of Current or Ex-Partner: No  . Emotionally Abused: No  . Physically Abused: No  . Sexually Abused: No    Outpatient Medications Prior to Visit  Medication Sig Dispense Refill  . acetaminophen (TYLENOL) 500 MG tablet Take 500 mg by mouth every 6 (six) hours as needed.    Marland Kitchen albuterol (VENTOLIN HFA) 108 (90 Base) MCG/ACT inhaler Inhale 1-2 puffs into the lungs every 6 (six) hours as needed for wheezing or shortness of breath. 8 g 1  . Ascorbic Acid (VITAMIN C WITH ROSE HIPS) 500 MG tablet Take 500 mg by mouth daily.    . cholecalciferol (VITAMIN D3) 25 MCG (1000 UNIT) tablet Take 1,000 Units by mouth daily.    Marland Kitchen EPINEPHrine 0.3 mg/0.3 mL IJ SOAJ injection Inject 0.3 mLs (0.3 mg total) into the skin as needed. 2 each 2  . Multiple Vitamin (MULTIVITAMIN) tablet Take 1 tablet by mouth daily.    Marland Kitchen SYNTHROID 112 MCG tablet TAKE 1 TABLET(112 MCG) BY MOUTH DAILY 90 tablet 3  . hydrochlorothiazide (HYDRODIURIL) 12.5 MG tablet Take 1 tablet (12.5 mg total) by mouth daily. 7 tablet 0   No facility-administered medications prior to visit.    Allergies  Allergen Reactions  . Coconut (Cocos Nucifera) Allergy Skin Test Anaphylaxis  . Coconut Oil Anaphylaxis  . Prednisone Hypertension    Severe headache  . Other        . Naproxen Other  (See Comments)    headache    ROS Review of Systems  Constitutional: Positive for fatigue. Negative for chills and fever.  HENT: Negative for congestion, sinus pressure, sinus pain and sore throat.   Eyes: Negative for pain and discharge.  Respiratory: Negative for cough and shortness of breath.   Cardiovascular: Positive for leg swelling. Negative for chest pain and palpitations.  Gastrointestinal: Negative for abdominal pain, constipation, diarrhea, nausea and vomiting.  Endocrine: Negative for polydipsia and polyuria.  Genitourinary: Negative for dysuria and hematuria.  Musculoskeletal: Negative for neck pain and neck stiffness.  Skin: Negative for rash.  Neurological: Negative for dizziness and weakness.  Psychiatric/Behavioral: Negative for agitation and behavioral problems.      Objective:    Physical Exam Vitals reviewed.  Constitutional:      General: She is not in acute distress.    Appearance: She is obese. She is not diaphoretic.  HENT:     Head: Normocephalic and atraumatic.     Nose: Nose normal. No congestion.     Mouth/Throat:     Mouth: Mucous membranes are moist.     Pharynx: No posterior oropharyngeal erythema.  Eyes:     General: No scleral icterus.    Extraocular Movements: Extraocular movements intact.     Pupils: Pupils are equal, round, and reactive to light.  Cardiovascular:     Rate and Rhythm: Normal rate and regular rhythm.     Pulses: Normal pulses.  Heart sounds: Normal heart sounds. No murmur heard.   Pulmonary:     Breath sounds: Normal breath sounds. No wheezing or rales.  Abdominal:     Palpations: Abdomen is soft.     Tenderness: There is no abdominal tenderness.  Musculoskeletal:     Cervical back: Neck supple. No tenderness.     Right lower leg: No edema.     Left lower leg: No edema.  Skin:    General: Skin is warm.     Findings: No rash.  Neurological:     General: No focal deficit present.     Mental Status: She is  alert and oriented to person, place, and time.  Psychiatric:        Mood and Affect: Mood normal.        Behavior: Behavior normal.     BP 111/71 (BP Location: Right Arm, Patient Position: Sitting, Cuff Size: Normal)   Pulse 78   Temp 98.2 F (36.8 C) (Oral)   Resp 18   Ht $R'5\' 6"'lI$  (1.676 m)   Wt 229 lb (103.9 kg)   SpO2 98%   BMI 36.96 kg/m  Wt Readings from Last 3 Encounters:  06/30/20 229 lb (103.9 kg)  06/14/20 229 lb (103.9 kg)  04/29/20 225 lb (102.1 kg)     Health Maintenance Due  Topic Date Due  . FOOT EXAM  Never done  . URINE MICROALBUMIN  08/17/2018  . HEMOGLOBIN A1C  03/09/2020    There are no preventive care reminders to display for this patient.  Lab Results  Component Value Date   TSH 1.48 03/08/2020   Lab Results  Component Value Date   WBC 5.3 10/22/2019   HGB 13.8 10/22/2019   HCT 43.5 10/22/2019   MCV 84.6 10/22/2019   PLT 189 10/22/2019   Lab Results  Component Value Date   NA 139 03/08/2020   K 4.3 03/08/2020   CO2 25 03/08/2020   GLUCOSE 107 (H) 03/08/2020   BUN 19 03/08/2020   CREATININE 0.75 03/08/2020   BILITOT 0.6 03/08/2020   ALKPHOS 95 01/19/2019   AST 18 03/08/2020   ALT 15 03/08/2020   PROT 6.8 03/08/2020   ALBUMIN 3.5 01/19/2019   CALCIUM 9.3 03/08/2020   ANIONGAP 7 01/19/2019   Lab Results  Component Value Date   CHOL 194 10/22/2019   Lab Results  Component Value Date   HDL 64 10/22/2019   Lab Results  Component Value Date   LDLCALC 114 (H) 10/22/2019   Lab Results  Component Value Date   TRIG 68 10/22/2019   Lab Results  Component Value Date   CHOLHDL 3.0 10/22/2019   Lab Results  Component Value Date   HGBA1C 5.8 (A) 09/08/2019      Assessment & Plan:   Chronic LE swelling Echo reviewed - noncontributory Kidney function stable - reviewed from BMP Normal serum albumin LE swelling likely due to venous incompetence Continue using compression stockings Advised to keep legs elevated at nighttime  and sit in a recliner chair Avoid prolonged standing Lasix PRN with BP check, avoid if BP < 110/70  Post-COVID fatigue Improving now Small, frequent meals Advised for adequate hydration  Hypothyroidism On levothyroxine 112 MCG daily Check TSH and free T4  Meds ordered this encounter  Medications  . furosemide (LASIX) 20 MG tablet    Sig: Take 1 tablet (20 mg total) by mouth daily.    Dispense:  30 tablet    Refill:  0  Follow-up: Return in about 4 months (around 10/28/2020) for Annual physical.    Lindell Spar, MD

## 2020-06-30 NOTE — Patient Instructions (Addendum)
Please continue to use compression stocking for leg swelling.  Please try to keep legs elevated at nighttime and sit in a recliner chair if possible.  Continue to perform moderate exercise/walking as tolerated.  Please take Lasix for severe leg swelling despite using compression stocking. Please take Lasix only if BP is more than 110/70.

## 2020-07-01 ENCOUNTER — Ambulatory Visit: Payer: Medicare Other | Admitting: Family Medicine

## 2020-07-28 ENCOUNTER — Other Ambulatory Visit: Payer: Self-pay | Admitting: Internal Medicine

## 2020-07-28 DIAGNOSIS — M7989 Other specified soft tissue disorders: Secondary | ICD-10-CM

## 2020-08-25 ENCOUNTER — Other Ambulatory Visit: Payer: Self-pay | Admitting: Internal Medicine

## 2020-08-25 ENCOUNTER — Encounter: Payer: Self-pay | Admitting: Family Medicine

## 2020-08-25 DIAGNOSIS — M7989 Other specified soft tissue disorders: Secondary | ICD-10-CM

## 2020-08-30 ENCOUNTER — Telehealth: Payer: Self-pay

## 2020-08-30 ENCOUNTER — Other Ambulatory Visit (INDEPENDENT_AMBULATORY_CARE_PROVIDER_SITE_OTHER): Payer: Medicare Other

## 2020-08-30 DIAGNOSIS — R300 Dysuria: Secondary | ICD-10-CM | POA: Diagnosis not present

## 2020-08-30 LAB — POCT URINALYSIS DIPSTICK
Bilirubin, UA: NEGATIVE
Blood, UA: NEGATIVE
Glucose, UA: NEGATIVE
Ketones, UA: NEGATIVE
Leukocytes, UA: NEGATIVE
Nitrite, UA: NEGATIVE
Protein, UA: NEGATIVE
Spec Grav, UA: 1.025 (ref 1.010–1.025)
Urobilinogen, UA: 0.2 E.U./dL
pH, UA: 5 (ref 5.0–8.0)

## 2020-08-30 NOTE — Telephone Encounter (Signed)
Error

## 2020-08-31 ENCOUNTER — Other Ambulatory Visit: Payer: Self-pay

## 2020-08-31 ENCOUNTER — Encounter: Payer: Self-pay | Admitting: Nurse Practitioner

## 2020-08-31 ENCOUNTER — Telehealth (INDEPENDENT_AMBULATORY_CARE_PROVIDER_SITE_OTHER): Payer: Medicare Other | Admitting: Nurse Practitioner

## 2020-08-31 DIAGNOSIS — R3911 Hesitancy of micturition: Secondary | ICD-10-CM

## 2020-08-31 NOTE — Progress Notes (Signed)
Acute Office Visit  Subjective:    Patient ID: Kara [name], female    DOB: [birth year], 75 y.o.   MRN: [id number]  No chief complaint on file.   HPI Patient is in today for lower abdominal pain that radiate to her back, decreased urine flow- trickles, no odor or blood in urine.  She has been taking tylenol for pain.  She states that her symptoms have been off and on for a year.  Past Medical History:  Diagnosis Date  . Allergy    food allergies, drug allergies  . Anemia   . Anxiety   . Arthritis   . 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 Breast pain, left 02/25/2018  . Cataract   . Change in bowel function 04/09/2018  . Chronic pain of left knee 09/03/2019  . Colon polyps 08/23/2016  . COVID-19 virus infection 01/14/2020  . Dyspepsia 09/29/2016  . Family history of breast cancer   . Family history of cancer 08/23/2016   Aunt is [employer]  . Family history of prostate cancer   . Genetic testing 08/11/2019   Negative genetic testing on the [facility] 9-gene STAT panel.  The STAT Breast cancer panel offered by [facility] includes sequencing and rearrangement analysis for the following 9 genes:  ATM, BRCA1, BRCA2, CDH1, CHEK2, PALB2, PTEN, STK11 and TP53.   The report date is August 09, 2019.  Marland Kitchen GERD (gastroesophageal reflux disease)   . H/O swallowed foreign body 11/20/2018  . Headache in back of head 12/31/2018  . Hip pain, acute, left 03/27/2019  . HLD (hyperlipidemia) 08/23/2016  . Hyperlipidemia   . Hypothyroid 08/14/2016  . Insomnia due to stress 12/23/2018  . Malignant neoplasm of upper-outer quadrant of left breast in female, estrogen receptor negative ([place]) 06/11/2019   DCIS left breast  . Perimenopausal vasomotor symptoms 02/11/2016  . Personal history of radiation therapy 2021   completed left breast radiation in April 2021  . Postmenopausal atrophic vaginitis 11/09/2015  . Pre-diabetes    no meds  . Prediabetes 08/30/2017  . Thyroid disease    Grave's  .  Vaginal discharge 01/16/2019  . Weight loss, unintentional 03/27/2019    Past Surgical History:  Procedure Laterality Date  . ABDOMINAL HYSTERECTOMY     bleeding. fibroids  . BREAST BIOPSY Left 05/2019   left breast DCIS  . BREAST LUMPECTOMY Left 06/2019   DCIS  . BREAST LUMPECTOMY WITH RADIOACTIVE SEED LOCALIZATION Left 07/08/2019   Procedure: LEFT BREAST LUMPECTOMY WITH RADIOACTIVE SEED LOCALIZATION;  Surgeon: Jovita Kussmaul, MD;  Location: [facility];  Service: General;  Laterality: Left;  . BREAST SURGERY N/A    Phreesia 08/30/2020  . CESAREAN SECTION    . CESAREAN SECTION N/A    [employer]reesia 08/30/2020  . CHOLECYSTECTOMY    . COLONOSCOPY  2011   [place]: two 3-4 mm sessile polyps, hyperplastic, sigmoid diverticula, TI appeared normal.   . ESOPHAGOGASTRODUODENOSCOPY  2011   [place]: non-bleeding erosive gastropathy, normal duodenum. path: unremarkable duodenum, negative H.pylori, minimal esophagitis   . TONSILLECTOMY  1975    Family History  Problem Relation Age of Onset  . Breast cancer Maternal Grandmother        dx over 58  . Prostate cancer Maternal Grandfather   . Arthritis Mother   . COPD Mother   . Breast cancer Mother 61       second at age 79  . Cancer Father  throat  . Stroke Father   . Cancer Maternal Aunt [address] L[place], cervical cancer  . Breast cancer Maternal Aunt        dx over 53  . Breast cancer Paternal Aunt        dx under 27  . Prostate cancer Maternal Uncle        dx over 91  . Cancer Other        father's maternal cousin was Henreietta Lacks, Cervical  . Cancer Maternal Uncle        unknown cancer  . Breast cancer Paternal Aunt        dx over 62  . Colon cancer Neg Hx   . Allergic rhinitis Neg Hx   . Angioedema Neg Hx   . Asthma Neg Hx   . Atopy Neg Hx   . Eczema Neg Hx   . Urticaria Neg Hx   . Immunodeficiency Neg Hx     Social History   Socioeconomic History  . Marital status: Divorced    Spouse  name: Not on file  . Number of children: 2  . Years of education: 81  . Highest education level: Not on file  Occupational History  . 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 Smoking status: Former Smoker    Quit date: 06/15/1998    Years since quitting: 22.2  . Smokeless tobacco: Never Used  . Tobacco comment: quit 2002  Vaping Use  . Vaping Use: Never used  Substance and Sexual Activity  . Alcohol use: No  . Drug use: No  . Sexual activity: Not Currently    Birth control/protection: Surgical    Comment: hyst  Other Topics Concern  . Not on file  Social History Narrative   Lives alone   2 grown children- one in [place] and one here   [place] from [employer] for aging mother who is 84 with Alzheimers      Maternal Aunt is Stage manager of the [employer] call line      Enjoy: gym, time with friends       Diet: eats all food groups   Caffeine: 2 diet cokes daily    Water: 2-4 cups daily       Wears seat belt    Does not use phone while driving    Smoke and carbon Careers adviser at home   Data processing manager    No weapons    Social Determinants of Health   Financial Resource Strain: [place]   . Difficulty of Paying Living Expenses: Not hard at all  Food Insecurity: No Food Insecurity  . Worried About Charity fundraiser in the Last Year: Never true  . Ran Out of Food in the Last Year: Never true  Transportation Needs: No Transportation Needs  . Lack of Transportation (Medical): No  . Lack of Transportation (Non-Medical): No  Physical Activity: Sufficiently Active  . Days of Exercise per Week: 5 days  . Minutes of Exercise per Session: 30 min  Stress: Stress Concern Present  . Feeling of Stress : To some extent  Social Connections: Socially Isolated  . Frequency of Communication with Friends and Family: More than three times a week  . Frequency of Social Gatherings with Friends  and Family: Once a week  . Attends Religious Services: Never  .  Active Member of Clubs or Organizations: No  . Attends Archivist Meetings: Never  . Marital Status: Divorced  Human resources officer Violence: Not At Risk  . Fear of Current or Ex-Partner: No  . Emotionally Abused: No  . Physically Abused: No  . Sexually Abused: No    Outpatient Medications Prior to Visit  Medication Sig Dispense Refill  . acetaminophen (TYLENOL) 500 MG tablet Take 500 mg by mouth every 6 (six) hours as needed.    Marland Kitchen albuterol (VENTOLIN HFA) 108 (90 Base) MCG/ACT inhaler Inhale 1-2 puffs into the lungs every 6 (six) hours as needed for wheezing or shortness of breath. 8 g 1  . Ascorbic Acid (VITAMIN C WITH ROSE HIPS) 500 MG tablet Take 500 mg by mouth daily.    . cholecalciferol (VITAMIN D3) 25 MCG (1000 UNIT) tablet Take 1,000 Units by mouth daily.    Marland Kitchen EPINEPHrine 0.3 mg/0.3 mL IJ SOAJ injection Inject 0.3 mLs (0.3 mg total) into the skin as needed. 2 each 2  . furosemide (LASIX) 20 MG tablet TAKE 1 TABLET(20 MG) BY MOUTH DAILY 30 tablet 0  . Multiple Vitamin (MULTIVITAMIN) tablet Take 1 tablet by mouth daily.    Marland Kitchen SYNTHROID 112 MCG tablet TAKE 1 TABLET(112 MCG) BY MOUTH DAILY 90 tablet 3   No facility-administered medications prior to visit.    Allergies  Allergen Reactions  . Coconut (Cocos Nucifera) Allergy Skin Test Anaphylaxis  . Coconut Oil Anaphylaxis  . Prednisone Hypertension    Severe headache  . Other        . Naproxen Other (See Comments)    headache    Review of Systems  Constitutional: Negative.   Respiratory: Negative.   Cardiovascular: Negative.   Gastrointestinal: Positive for abdominal pain.  Genitourinary: Positive for decreased urine volume and difficulty urinating. Negative for urgency and vaginal bleeding.       Objective:    Physical Exam  There were no vitals taken for this visit. Wt Readings from Last 3 Encounters:  06/30/20 229 lb (103.9 kg)  06/14/20  229 lb (103.9 kg)  04/29/20 225 lb (102.1 kg)    Health Maintenance Due  Topic Date Due  . FOOT EXAM  Never done  . URINE MICROALBUMIN  08/17/2018  . HEMOGLOBIN A1C  03/09/2020    There are no preventive care reminders to display for this patient.   Lab Results  Component Value Date   TSH 1.48 03/08/2020   Lab Results  Component Value Date   WBC 5.3 10/22/2019   HGB 13.8 10/22/2019   HCT 43.5 10/22/2019   MCV 84.6 10/22/2019   PLT 189 10/22/2019   Lab Results  Component Value Date   NA 139 03/08/2020   K 4.3 03/08/2020   CO2 25 03/08/2020   GLUCOSE 107 (H) 03/08/2020   BUN 19 03/08/2020   CREATININE 0.75 03/08/2020   BILITOT 0.6 03/08/2020   ALKPHOS 95 01/19/2019   AST 18 03/08/2020   ALT 15 03/08/2020   PROT 6.8 03/08/2020   ALBUMIN 3.5 01/19/2019   CALCIUM 9.3 03/08/2020   ANIONGAP 7 01/19/2019   Lab Results  Component Value Date   CHOL 194 10/22/2019   Lab Results  Component Value Date   HDL 64 10/22/2019   Lab Results  Component Value Date   LDLCALC 114 (H) 10/22/2019   Lab Results  Component Value Date   TRIG 68 10/22/2019   Lab Results  Component Value Date   CHOLHDL 3.0 10/22/2019  Lab Results  Component Value Date   HGBA1C 5.8 (A) 09/08/2019       Assessment & Plan:   Problem List Items Addressed This Visit      Other   Hesitancy of micturition - Primary    -U/A today was negative for blood, leuks, or nitrites -since her symptoms have been on/off for a year, will refer her to urology to r/o mechanical etiology -urine culture ordered; will consider abx based on results      Relevant Orders   Ambulatory referral to Urology       No orders of the defined types were placed in this encounter.  Date:  08/31/2020   Location of Patient: Home Location of Provider: Office Consent was obtain for visit to be over via telehealth. I verified that I am speaking with the correct person using two identifiers.  I connected with   [name] on 08/31/20 via telephone and verified that I am speaking with the correct person using two identifiers.   I discussed the limitations of evaluation and management by telemedicine. The patient expressed understanding and agreed to proceed.  Time spent: 9 min   Noreene Larsson, NP

## 2020-08-31 NOTE — Progress Notes (Signed)
U/A is negative for UTI. I'll call her at her appointment time.

## 2020-08-31 NOTE — Assessment & Plan Note (Signed)
-  U/A today was negative for blood, leuks, or nitrites -since her symptoms have been on/off for a year, will refer her to urology to r/o mechanical etiology -urine culture ordered; will consider abx based on results

## 2020-09-01 LAB — URINE CULTURE

## 2020-09-09 ENCOUNTER — Telehealth: Payer: Medicare Other | Admitting: Internal Medicine

## 2020-09-09 ENCOUNTER — Other Ambulatory Visit: Payer: Self-pay

## 2020-09-14 DIAGNOSIS — D0512 Intraductal carcinoma in situ of left breast: Secondary | ICD-10-CM | POA: Diagnosis not present

## 2020-09-21 ENCOUNTER — Other Ambulatory Visit: Payer: Self-pay | Admitting: Internal Medicine

## 2020-09-21 DIAGNOSIS — M7989 Other specified soft tissue disorders: Secondary | ICD-10-CM

## 2020-09-25 DIAGNOSIS — Z20822 Contact with and (suspected) exposure to covid-19: Secondary | ICD-10-CM | POA: Diagnosis not present

## 2020-10-04 ENCOUNTER — Ambulatory Visit: Payer: Medicare Other | Admitting: Internal Medicine

## 2020-10-05 NOTE — Progress Notes (Signed)
Subjective:   Kara [name] is a 75 y.o. female who presents for an Initial Medicare Annual Wellness Visit.  Virtual Visit via Video Note  I connected with [name] by a video enabled telemedicine application and verified that I am speaking with the correct person using two identifiers.  Location: Patient: Home Provider: Office Persons participating in the virtual visit: patient, provider   I discussed the limitations of evaluation and management by telemedicine and the availability of in person appointments. The patient expressed understanding and agreed to proceed.     Larene Beach Veron Senner,LPN    Review of Systems    N/A Cardiac Risk Factors include: advanced age (>35men, >49 women);hypertension;obesity (BMI >30kg/m2)     Objective:    Today's Vitals   There is no height or weight on file to calculate BMI.  Advanced Directives 10/06/2020 02/19/2020 11/26/2019 10/23/2019 09/16/2019 07/28/2019 07/08/2019  Does Patient Have a Medical Advance Directive? Yes Yes Yes Yes Yes Yes Yes  Type of Paramedic of [place];Living will Healthcare Power of [place];Living will [place];Living will - [place];Living will [place];Living will  Does patient want to make changes to medical advance directive? No - Patient declined - Yes (MAU/Ambulatory/Procedural Areas - Information given) No - Patient declined No - Patient declined - No - Patient declined  Copy of [place] in Chart? No - copy requested - No - copy requested - - No - copy requested No - copy requested  Would patient like information on creating a medical advance directive? - - - - - - -    Current Medications (verified) Outpatient Encounter Medications as of 10/06/2020  Medication Sig  . acetaminophen (TYLENOL) 500 MG tablet Take 500 mg by mouth every 6 (six) hours as needed.  . Ascorbic Acid  (VITAMIN C WITH ROSE HIPS) 500 MG tablet Take 500 mg by mouth daily.  . cholecalciferol (VITAMIN D3) 25 MCG (1000 UNIT) tablet Take 1,000 Units by mouth daily.  . Multiple Vitamin (MULTIVITAMIN) tablet Take 1 tablet by mouth daily.  Marland Kitchen SYNTHROID 112 MCG tablet TAKE 1 TABLET(112 MCG) BY MOUTH DAILY  . albuterol (VENTOLIN HFA) 108 (90 Base) MCG/ACT inhaler Inhale 1-2 puffs into the lungs every 6 (six) hours as needed for wheezing or shortness of breath. (Patient not taking: Reported on 10/06/2020)  . EPINEPHrine 0.3 mg/0.3 mL IJ SOAJ injection Inject 0.3 mLs (0.3 mg total) into the skin as needed. (Patient not taking: Reported on 10/06/2020)  . [DISCONTINUED] furosemide (LASIX) 20 MG tablet TAKE 1 TABLET(20 MG) BY MOUTH DAILY   No facility-administered encounter medications on file as of 10/06/2020.    Allergies (verified) Coconut (cocos nucifera) allergy skin test, Coconut oil, Prednisone, Other, and Naproxen   History: Past Medical History:  Diagnosis Date  . Allergy    food allergies, drug allergies  . Anemia   . Anxiety   . Arthritis   . 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 Breast pain, left 02/25/2018  . Cataract   . Change in bowel function 04/09/2018  . Chronic pain of left knee 09/03/2019  . Colon polyps 08/23/2016  . COVID-19 virus infection 01/14/2020  . Dyspepsia 09/29/2016  . Family history of breast cancer   . Family history of cancer 08/23/2016   Aunt is [employer]  . Family history of prostate cancer   . Genetic testing 08/11/2019   Negative genetic testing  on the [facility] 9-gene STAT panel.  The STAT Breast cancer panel offered by [facility] includes sequencing and rearrangement analysis for the following 9 genes:  ATM, BRCA1, BRCA2, CDH1, CHEK2, PALB2, PTEN, STK11 and TP53.   The report date is August 09, 2019.  Marland Kitchen GERD (gastroesophageal reflux disease)   . H/O swallowed foreign body 11/20/2018  . Headache in back of head 12/31/2018  . Hip pain, acute, left  03/27/2019  . HLD (hyperlipidemia) 08/23/2016  . Hyperlipidemia   . Hypothyroid 08/14/2016  . Insomnia due to stress 12/23/2018  . Malignant neoplasm of upper-outer quadrant of left breast in female, estrogen receptor negative ([place]) 06/11/2019   DCIS left breast  . Perimenopausal vasomotor symptoms 02/11/2016  . Personal history of radiation therapy 2021   completed left breast radiation in April 2021  . Postmenopausal atrophic vaginitis 11/09/2015  . Pre-diabetes    no meds  . Prediabetes 08/30/2017  . Thyroid disease    Grave's  . Vaginal discharge 01/16/2019  . Weight loss, unintentional 03/27/2019   Past Surgical History:  Procedure Laterality Date  . ABDOMINAL HYSTERECTOMY     bleeding. fibroids  . BREAST BIOPSY Left 05/2019   left breast DCIS  . BREAST LUMPECTOMY Left 06/2019   DCIS  . BREAST LUMPECTOMY WITH RADIOACTIVE SEED LOCALIZATION Left 07/08/2019   Procedure: LEFT BREAST LUMPECTOMY WITH RADIOACTIVE SEED LOCALIZATION;  Surgeon: Jovita Kussmaul, MD;  Location: [facility];  Service: General;  Laterality: Left;  . BREAST SURGERY N/A    Phreesia 08/30/2020  . CESAREAN SECTION    . CESAREAN SECTION N/A    Phreesia 08/30/2020  . CHOLECYSTECTOMY    . COLONOSCOPY  2011   [place]: two 3-4 mm sessile polyps, hyperplastic, sigmoid diverticula, TI appeared normal.   . ESOPHAGOGASTRODUODENOSCOPY  2011   [place]: non-bleeding erosive gastropathy, normal duodenum. path: unremarkable duodenum, negative H.pylori, minimal esophagitis   . TONSILLECTOMY  1975   Family History  Problem Relation Age of Onset  . Breast cancer Maternal Grandmother        dx over 63  . Prostate cancer Maternal Grandfather   . Arthritis Mother   . COPD Mother   . Breast cancer Mother 69       second at age 68  . Cancer Father        throat  . Stroke Father   . Cancer Maternal Aunt [address] Lacks, cervical cancer  . Breast cancer Maternal Aunt        dx over 21  . Breast  cancer Paternal Aunt        dx under 70  . Prostate cancer Maternal Uncle        dx over 16  . Cancer Other        father's maternal cousin was Henreietta Lacks, Cervical  . Cancer Maternal Uncle        unknown cancer  . Breast cancer Paternal Aunt        dx over 6  . Colon cancer Neg Hx   . Allergic rhinitis Neg Hx   . Angioedema Neg Hx   . Asthma Neg Hx   . Atopy Neg Hx   . Eczema Neg Hx   . Urticaria Neg Hx   . Immunodeficiency Neg Hx    Social History   Socioeconomic History  . Marital status: Divorced    Spouse name: Not on file  . Number of children: 2  . Years  of education: 15  . Highest education level: Not on file  Occupational History  . 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 Smoking status: Former Smoker    Quit date: 06/15/1998    Years since quitting: 22.3  . Smokeless tobacco: Never Used  . Tobacco comment: quit 2002  Vaping Use  . Vaping Use: Never used  Substance and Sexual Activity  . Alcohol use: No  . Drug use: No  . Sexual activity: Not Currently    Birth control/protection: Surgical    Comment: hyst  Other Topics Concern  . Not on file  Social History Narrative   Lives alone   2 grown children- one in [place] and one here   [employer] to [place] from [employer] for aging mother who is 63 with Alzheimers      Maternal Aunt is Stage manager of the [employer] cancer call line      Enjoy: gym, time with friends       Diet: eats all food groups   Caffeine: 2 diet cokes daily    Water: 2-4 cups daily       Wears seat belt    Does not use phone while driving    Smoke and carbon Careers adviser at home   Data processing manager    No weapons    Social Determinants of Health   Financial Resource Strain: [place]   . Difficulty of Paying Living Expenses: Not hard at all  Food Insecurity: No Food Insecurity  . Worried About Charity fundraiser in the Last Year: Never true  .  Ran Out of Food in the Last Year: Never true  Transportation Needs: No Transportation Needs  . Lack of Transportation (Medical): No  . Lack of Transportation (Non-Medical): No  Physical Activity: Sufficiently Active  . Days of Exercise per Week: 7 days  . Minutes of Exercise per Session: 30 min  Stress: No Stress Concern Present  . Feeling of Stress : Not at all  Social Connections: Moderately Isolated  . Frequency of Communication with Friends and Family: More than three times a week  . Frequency of Social Gatherings with Friends and Family: More than three times a week  . Attends Religious Services: Never  . Active Member of Clubs or Organizations: Yes  . Attends Archivist Meetings: More than 4 times per year  . Marital Status: Divorced    Tobacco Counseling Counseling given: Not Answered Comment: quit 2002   Clinical Intake:  Pre-visit preparation completed: Yes  Pain : No/denies pain     Nutritional Risks: None Diabetes: No  How often do you need to have someone help you when you read instructions, pamphlets, or other written materials from your doctor or pharmacy?: 1 - Never  Diabetic?No  Interpreter Needed?: No  Information entered by :: [place] of Daily Living In your present state of health, do you have any difficulty performing the following activities: 10/06/2020 06/30/2020  Hearing? N N  Vision? N N  Difficulty concentrating or making decisions? N N  Walking or climbing stairs? N N  Dressing or bathing? N N  Doing errands, shopping? N N  Preparing Food and eating ? N -  Using the Toilet? N -  In the past six months, have you accidently leaked urine? N -  Do you have problems with loss of bowel control?  N -  Managing your Medications? N -  Managing your Finances? N -  Housekeeping or managing your Housekeeping? N -  Some recent data might be hidden    Patient Care Team: Perlie Mayo, NP as PCP - General (Family  Medicine) Gala Romney Cristopher Estimable, MD as Consulting Physician (Gastroenterology) Jovita Kussmaul, MD as Consulting Physician (General Surgery) Nicholas Lose, MD as Consulting Physician (Hematology and Oncology) Gery Pray, MD as Consulting Physician (Radiation Oncology)  Indicate any recent Medical Services you may have received from other than [facility] providers in the past year (date may be approximate).     Assessment:   This is a routine wellness examination for Shritha.  Hearing/Vision screen  Hearing Screening   '125Hz'$  $Remo'250Hz'yVcHp$'500Hz'$'1000Hz'$'2000Hz'$'3000Hz'$'4000Hz'$'6000Hz'$'8000Hz'$   Right ear:           Left ear:           Vision Screening Comments: Patient states gets eyes examined once per year. Currently wears glasses for distance  Dietary issues and exercise activities discussed: Current Exercise Habits: Home exercise routine, Type of exercise: walking, Time (Minutes): 30, Frequency (Times/Week): 7, Weekly Exercise (Minutes/Week): 210  Goals Addressed   None    Depression Screen PHQ 2/9 Scores 10/06/2020 06/30/2020 04/29/2020 04/14/2020 02/04/2020 01/14/2020 12/30/2019  PHQ - 2 Score 0 0 0 0 0 0 0  PHQ- 9 Score - - - - - - -  Exception Documentation - - - - - - -    Fall Risk Fall Risk  10/06/2020 06/30/2020 04/29/2020 04/14/2020 02/04/2020  Falls in the past year? 0 0 0 1 1  Number falls in past yr: 0 0 0 0 0  Injury with Fall? 0 0 0 1 1  Risk for fall due to : No Fall Risks No Fall Risks No Fall Risks Impaired balance/gait History of fall(s)  Follow up Falls evaluation completed;Falls prevention discussed Falls evaluation completed Falls evaluation completed Falls evaluation completed Falls evaluation completed;Education provided;Falls prevention discussed;Follow up appointment    FALL RISK PREVENTION PERTAINING TO THE HOME:  Any stairs in or around the home? No  If so, are there any without handrails? No  Home free of loose throw rugs in walkways, pet beds, electrical cords, etc? Yes   Adequate lighting in your home to reduce risk of falls? Yes   ASSISTIVE DEVICES UTILIZED TO PREVENT FALLS:  Life alert? No  Use of a cane, walker or w/c? No  Grab bars in the bathroom? Yes  Shower chair or bench in shower? Yes  Elevated toilet seat or a handicapped toilet? Yes   Cognitive Function:     Normal cognitive status assessed by direct observation by this Nurse Health Advisor. No abnormalities found.     Immunizations Immunization History  Administered Date(s) Administered  . Fluad Quad(high Dose 65+) 02/11/2020  . Influenza, High Dose Seasonal PF 02/08/2018, 02/17/2019  . Influenza,inj,Quad PF,6+ Mos 02/28/2017  . Influenza-Unspecified 02/28/2017  . PFIZER(Purple Top)SARS-COV-2 Vaccination 06/06/2019, 06/24/2019, 04/21/2020  . Pneumococcal Conjugate-13 07/05/2016  . Pneumococcal Polysaccharide-23 04/29/2019  . Td 12/23/2018    TDAP status: Up to date  Flu Vaccine status: Up to date  Pneumococcal vaccine status: Up to date  Covid-19 vaccine status: Completed vaccines  Qualifies for Shingles Vaccine? Yes   Zostavax completed No   Shingrix Completed?: No.    Education has been provided regarding the importance of this vaccine. Patient has been advised to call insurance company to determine out of pocket  expense if they have not yet received this vaccine. Advised may also receive vaccine at local pharmacy or Health Dept. Verbalized acceptance and understanding.  Screening Tests Health Maintenance  Topic Date Due  . FOOT EXAM  Never done  . URINE MICROALBUMIN  08/17/2018  . HEMOGLOBIN A1C  03/09/2020  . COVID-19 Vaccine (4 - Booster for Pfizer series) 07/20/2020  . COLONOSCOPY (Pts 45-110yrs Insurance coverage will need to be confirmed)  05/19/2021 (Originally 09/07/2019)  . INFLUENZA VACCINE  12/13/2020  . OPHTHALMOLOGY EXAM  05/14/2021  . MAMMOGRAM  05/26/2022  . TETANUS/TDAP  12/22/2028  . DEXA SCAN  Completed  . Hepatitis C Screening  Completed  . PNA  vac Low Risk Adult  Completed  . HPV VACCINES  Aged Out    Health Maintenance  Health Maintenance Due  Topic Date Due  . FOOT EXAM  Never done  . URINE MICROALBUMIN  08/17/2018  . HEMOGLOBIN A1C  03/09/2020  . COVID-19 Vaccine (4 - Booster for Pfizer series) 07/20/2020    Colorectal cancer screening: Referral to GI placed 10/06/2020. Pt aware the office will call re: appt.  Mammogram status: Completed 05/26/2020. Repeat every year  Bone Density status: Completed 03/31/2019. Results reflect: Bone density results: OSTEOPENIA. Repeat every 5 years.  Lung Cancer Screening: (Low Dose CT Chest recommended if Age 107-80 years, 30 pack-year currently smoking OR have quit w/in 15years.) does not qualify.   Lung Cancer Screening Referral: N/A   Additional Screening:  Hepatitis C Screening: does qualify; Completed 03/31/2019  Vision Screening: Recommended annual ophthalmology exams for early detection of glaucoma and other disorders of the eye. Is the patient up to date with their annual eye exam?  Yes  Who is the provider or what is the name of the office in which the patient attends annual eye exams? Patient unsure of eye doctors name If pt is not established with a provider, would they like to be referred to a provider to establish care? No .   Dental Screening: Recommended annual dental exams for proper oral hygiene  Community Resource Referral / Chronic Care Management: CRR required this visit?  No   CCM required this visit?  No      Plan:     I have personally reviewed and noted the following in the patient's chart:   . Medical and social history . Use of alcohol, tobacco or illicit drugs  . Current medications and supplements including opioid prescriptions. Patient is not currently taking opioid prescriptions. . Functional ability and status . Nutritional status . Physical activity . Advanced directives . List of other physicians . Hospitalizations, surgeries, and ER  visits in previous 12 months . Vitals . Screenings to include cognitive, depression, and falls . Referrals and appointments  In addition, I have reviewed and discussed with patient certain preventive protocols, quality metrics, and best practice recommendations. A written personalized care plan for preventive services as well as general preventive health recommendations were provided to patient.     Ofilia Neas, LPN   05/17/7251   Nurse Notes: None

## 2020-10-06 ENCOUNTER — Other Ambulatory Visit: Payer: Self-pay

## 2020-10-06 ENCOUNTER — Ambulatory Visit (INDEPENDENT_AMBULATORY_CARE_PROVIDER_SITE_OTHER): Payer: Medicare Other

## 2020-10-06 DIAGNOSIS — Z0000 Encounter for general adult medical examination without abnormal findings: Secondary | ICD-10-CM

## 2020-10-06 NOTE — Patient Instructions (Signed)
Kara [name] , Thank you for taking time to come for your Medicare Wellness Visit. I appreciate your ongoing commitment to your health goals. Please review the following plan we discussed and let me know if I can assist you in the future.   Screening recommendations/referrals: Colonoscopy: Currently due, please let us know when you would like for [place] to get you set up with an gastroenterologist Mammogram: Up to date, next due 05/26/2021 Bone Density: Up to date, next due 03/30/2024 Recommended yearly ophthalmology/optometry visit for glaucoma screening and checkup Recommended yearly dental visit for hygiene and checkup  Vaccinations: Influenza vaccine: Up to date, next due fall 2022  Pneumococcal vaccine: Completed series  Tdap vaccine: Currently due, you may await and injury to receive  Shingles vaccine: Currently due for Shingrix, you have had Zostavax in the past however Shingrix has an higher efficacy rate. If you would like to receive the shingrix we recommend that you do so at your pharmacy    Advanced directives: Please bring in copies of your advanced medical directives so that we may scan them into your chart.   Conditions/risks identified: None   Next appointment: 10/12/2021 @ 3:00 PM with [place] 65 Years and Older, Female Preventive care refers to lifestyle choices and visits with your health care provider that can promote health and wellness. What does preventive care include?  A yearly physical exam. This is also called an annual well check.  Dental exams once or twice a year.  Routine eye exams. Ask your health care provider how often you should have your eyes checked.  Personal lifestyle choices, including:  Daily care of your teeth and gums.  Regular physical activity.  Eating a healthy diet.  Avoiding tobacco and drug use.  Limiting alcohol use.  Practicing safe sex.  Taking low-dose aspirin every day.  Taking vitamin and  mineral supplements as recommended by your health care provider. What happens during an annual well check? The services and screenings done by your health care provider during your annual well check will depend on your age, overall health, lifestyle risk factors, and family history of disease. Counseling  Your health care provider may ask you questions about your:  Alcohol use.  Tobacco use.  Drug use.  Emotional well-being.  Home and relationship well-being.  Sexual activity.  Eating habits.  History of falls.  Memory and ability to understand (cognition).  Work and work Statistician.  Reproductive health. Screening  You may have the following tests or measurements:  Height, weight, and BMI.  Blood pressure.  Lipid and cholesterol levels. These may be checked every 5 years, or more frequently if you are over 36 years old.  Skin check.  Lung cancer screening. You may have this screening every year starting at age 42 if you have a 30-pack-year history of smoking and currently smoke or have quit within the past 15 years.  Fecal occult blood test (FOBT) of the stool. You may have this test every year starting at age 63.  Flexible sigmoidoscopy or colonoscopy. You may have a sigmoidoscopy every 5 years or a colonoscopy every 10 years starting at age 44.  Hepatitis C blood test.  Hepatitis B blood test.  Sexually transmitted disease (STD) testing.  Diabetes screening. This is done by checking your blood sugar (glucose) after you have not eaten for a while (fasting). You may have this done every 1-3 years.  Bone density scan. This is done to screen for  osteoporosis. You may have this done starting at age 16.  Mammogram. This may be done every 1-2 years. Talk to your health care provider about how often you should have regular mammograms. Talk with your health care provider about your test results, treatment options, and if necessary, the need for more tests. Vaccines   Your health care provider may recommend certain vaccines, such as:  Influenza vaccine. This is recommended every year.  Tetanus, diphtheria, and acellular pertussis (Tdap, Td) vaccine. You may need a Td booster every 10 years.  Zoster vaccine. You may need this after age 6.  Pneumococcal 13-valent conjugate (PCV13) vaccine. One dose is recommended after age 57.  Pneumococcal polysaccharide (PPSV23) vaccine. One dose is recommended after age 76. Talk to your health care provider about which screenings and vaccines you need and how often you need them. This information is not intended to replace advice given to you by your health care provider. Make sure you discuss any questions you have with your health care provider. Document Released: 05/28/2015 Document Revised: 01/19/2016 Document Reviewed: 03/02/2015 Elsevier Interactive Patient Education  2017 [place] Prevention in the Home Falls can cause injuries. They can happen to people of all ages. There are many things you can do to make your home safe and to help prevent falls. What can I do on the outside of my home?  Regularly fix the edges of walkways and driveways and fix any cracks.  Remove anything that might make you trip as you walk through a door, such as a raised step or threshold.  Trim any bushes or trees on the path to your home.  Use bright outdoor lighting.  Clear any walking paths of anything that might make someone trip, such as rocks or tools.  Regularly check to see if handrails are loose or broken. Make sure that both sides of any steps have handrails.  Any raised decks and porches should have guardrails on the edges.  Have any leaves, snow, or ice cleared regularly.  Use sand or salt on walking paths during winter.  Clean up any spills in your garage right away. This includes oil or grease spills. What can I do in the bathroom?  Use night lights.  Install grab bars by the toilet and in the  tub and shower. Do not use towel bars as grab bars.  Use non-skid mats or decals in the tub or shower.  If you need to sit down in the shower, use a plastic, non-slip stool.  Keep the floor dry. Clean up any water that spills on the floor as soon as it happens.  Remove soap buildup in the tub or shower regularly.  Attach bath mats securely with double-sided non-slip rug tape.  Do not have throw rugs and other things on the floor that can make you trip. What can I do in the bedroom?  Use night lights.  Make sure that you have a light by your bed that is easy to reach.  Do not use any sheets or blankets that are too big for your bed. They should not hang down onto the floor.  Have a firm chair that has side arms. You can use this for support while you get dressed.  Do not have throw rugs and other things on the floor that can make you trip. What can I do in the kitchen?  Clean up any spills right away.  Avoid walking on wet floors.  Keep items that you use  a lot in easy-to-reach places.  If you need to reach something above you, use a strong step stool that has a grab bar.  Keep electrical cords out of the way.  Do not use floor polish or wax that makes floors slippery. If you must use wax, use non-skid floor wax.  Do not have throw rugs and other things on the floor that can make you trip. What can I do with my stairs?  Do not leave any items on the stairs.  Make sure that there are handrails on both sides of the stairs and use them. Fix handrails that are broken or loose. Make sure that handrails are as long as the stairways.  Check any carpeting to make sure that it is firmly attached to the stairs. Fix any carpet that is loose or worn.  Avoid having throw rugs at the top or bottom of the stairs. If you do have throw rugs, attach them to the floor with carpet tape.  Make sure that you have a light switch at the top of the stairs and the bottom of the stairs. If you  do not have them, ask someone to add them for you. What else can I do to help prevent falls?  Wear shoes that:  Do not have high heels.  Have rubber bottoms.  Are comfortable and fit you well.  Are closed at the toe. Do not wear sandals.  If you use a stepladder:  Make sure that it is fully opened. Do not climb a closed stepladder.  Make sure that both sides of the stepladder are locked into place.  Ask someone to hold it for you, if possible.  Clearly mark and make sure that you can see:  Any grab bars or handrails.  First and last steps.  Where the edge of each step is.  Use tools that help you move around (mobility aids) if they are needed. These include:  Canes.  Walkers.  Scooters.  Crutches.  Turn on the lights when you go into a dark area. Replace any light bulbs as soon as they burn out.  Set up your furniture so you have a clear path. Avoid moving your furniture around.  If any of your floors are uneven, fix them.  If there are any pets around you, be aware of where they are.  Review your medicines with your doctor. Some medicines can make you feel dizzy. This can increase your chance of falling. Ask your doctor what other things that you can do to help prevent falls. This information is not intended to replace advice given to you by your health care provider. Make sure you discuss any questions you have with your health care provider. Document Released: 02/25/2009 Document Revised: 10/07/2015 Document Reviewed: 06/05/2014 Elsevier Interactive Patient Education  2017 [employer].

## 2020-10-13 ENCOUNTER — Other Ambulatory Visit: Payer: Self-pay

## 2020-10-13 ENCOUNTER — Encounter: Payer: Self-pay | Admitting: Internal Medicine

## 2020-10-13 ENCOUNTER — Ambulatory Visit (INDEPENDENT_AMBULATORY_CARE_PROVIDER_SITE_OTHER): Payer: Medicare Other | Admitting: Internal Medicine

## 2020-10-13 VITALS — BP 115/77 | HR 80 | Temp 97.3°F | Ht 66.0 in | Wt 236.0 lb

## 2020-10-13 DIAGNOSIS — M65331 Trigger finger, right middle finger: Secondary | ICD-10-CM

## 2020-10-13 DIAGNOSIS — E890 Postprocedural hypothyroidism: Secondary | ICD-10-CM

## 2020-10-13 DIAGNOSIS — M7989 Other specified soft tissue disorders: Secondary | ICD-10-CM | POA: Diagnosis not present

## 2020-10-13 DIAGNOSIS — R0602 Shortness of breath: Secondary | ICD-10-CM | POA: Diagnosis not present

## 2020-10-13 DIAGNOSIS — M722 Plantar fascial fibromatosis: Secondary | ICD-10-CM | POA: Insufficient documentation

## 2020-10-13 MED ORDER — ALBUTEROL SULFATE HFA 108 (90 BASE) MCG/ACT IN AERS: 1.0000 | INHALATION_SPRAY | Freq: Four times a day (QID) | RESPIRATORY_TRACT | 1 refills | Status: DC | PRN

## 2020-10-13 MED ORDER — HYDROCHLOROTHIAZIDE 12.5 MG PO CAPS: 12.5000 mg | ORAL_CAPSULE | Freq: Every day | ORAL | 2 refills | Status: DC | PRN

## 2020-10-13 NOTE — Assessment & Plan Note (Signed)
Had it during Post-COVID phase Albuterol PRN

## 2020-10-13 NOTE — Patient Instructions (Signed)
Plantar Fasciitis  Plantar fasciitis is a painful foot condition that affects the heel. It occurs when the band of tissue that connects the toes to the heel bone (plantar fascia) becomes irritated. This can happen as the result of exercising too much or doing other repetitive activities (overuse injury). Plantar fasciitis can cause mild irritation to severe pain that makes it difficult to walk or move. The pain is usually worse in the morning after sleeping, or after sitting or lying down for a period of time. Pain may also be worse after long periods of walking or standing. What are the causes? This condition may be caused by:  Standing for long periods of time.  Wearing shoes that do not have good arch support.  Doing activities that put stress on joints (high-impact activities). This includes ballet and exercise that makes your heart beat faster (aerobic exercise), such as running.  Being overweight.  An abnormal way of walking (gait).  Tight muscles in the back of your lower leg (calf).  High arches in your feet or flat feet.  Starting a new athletic activity. What are the signs or symptoms? The main symptom of this condition is heel pain. Pain may get worse after the following:  Taking the first steps after a time of rest, especially in the morning after awakening, or after you have been sitting or lying down for a while.  Long periods of standing still. Pain may decrease after 30-45 minutes of activity, such as gentle walking. How is this diagnosed? This condition may be diagnosed based on your medical history, a physical exam, and your symptoms. Your health care provider will check for:  A tender area on the bottom of your foot.  A high arch in your foot or flat feet.  Pain when you move your foot.  Difficulty moving your foot. You may have imaging tests to confirm the diagnosis, such as:  X-rays.  Ultrasound.  MRI. How is this treated? Treatment for plantar  fasciitis depends on how severe your condition is. Treatment may include:  Rest, ice, pressure (compression), and raising (elevating) the affected foot. This is called RICE therapy. Your health care provider may recommend RICE therapy along with over-the-counter pain medicines to manage your pain.  Exercises to stretch your calves and your plantar fascia.  A splint that holds your foot in a stretched, upward position while you sleep (night splint).  Physical therapy to relieve symptoms and prevent problems in the future.  Injections of steroid medicine (cortisone) to relieve pain and inflammation.  Stimulating your plantar fascia with electrical impulses (extracorporeal shock wave therapy). This is usually the last treatment option before surgery.  Surgery, if other treatments have not worked after 12 months. Follow these instructions at home: Managing pain, stiffness, and swelling  If directed, put ice on the painful area. To do this: ? Put ice in a plastic bag, or use a frozen bottle of water. ? Place a towel between your skin and the bag or bottle. ? Roll the bottom of your foot over the bag or bottle. ? Do this for 20 minutes, 2-3 times a day.  Wear athletic shoes that have air-sole or gel-sole cushions, or try soft shoe inserts that are designed for plantar fasciitis.  Elevate your foot above the level of your heart while you are sitting or lying down.   Activity  Avoid activities that cause pain. Ask your health care provider what activities are safe for you.  Do physical therapy exercises   and stretches as told by your health care provider.  Try activities and forms of exercise that are easier on your joints (low impact). Examples include swimming, water aerobics, and biking. General instructions  Take over-the-counter and prescription medicines only as told by your health care provider.  Wear a night splint while sleeping, if told by your health care provider. Loosen the  splint if your toes tingle, become numb, or turn cold and blue.  Maintain a healthy weight, or work with your health care provider to lose weight as needed.  Keep all follow-up visits. This is important. Contact a health care provider if you have:  Symptoms that do not go away with home treatment.  Pain that gets worse.  Pain that affects your ability to move or do daily activities. Summary  Plantar fasciitis is a painful foot condition that affects the heel. It occurs when the band of tissue that connects the toes to the heel bone (plantar fascia) becomes irritated.  Heel pain is the main symptom of this condition. It may get worse after exercising too much or standing still for a long time.  Treatment varies, but it usually starts with rest, ice, pressure (compression), and raising (elevating) the affected foot. This is called RICE therapy. Over-the-counter medicines can also be used to manage pain. This information is not intended to replace advice given to you by your health care provider. Make sure you discuss any questions you have with your health care provider. Document Revised: 08/18/2019 Document Reviewed: 08/18/2019 Elsevier Patient Education  2021 [place].  Trigger Finger  Trigger finger, also called stenosing tenosynovitis,  is a condition that causes a finger to get stuck in a bent position. Each finger has a tendon, which is a tough, cord-like tissue that connects muscle to bone, and each tendon passes through a tunnel of tissue called a tendon sheath. To move your finger, your tendon needs to glide freely through the sheath. Trigger finger happens when the tendon or the sheath thickens, making it difficult to move your finger. Trigger finger can affect any finger or a thumb. It may affect more than one finger. Mild cases may clear up with rest and medicine. Severe cases require more treatment. What are the causes? Trigger finger is caused by a thickened finger tendon  or tendon sheath. The cause of this thickening is not known. What increases the risk? The following factors may make you more likely to develop this condition:  Doing activities that require a strong grip.  Having rheumatoid arthritis, gout, or diabetes.  Being 82-42 years old.  Being female. What are the signs or symptoms? Symptoms of this condition include:  Pain when bending or straightening your finger.  Tenderness or swelling where your finger attaches to the palm of your hand.  A lump in the palm of your hand or on the inside of your finger.  Hearing a noise like a pop or a snap when you try to straighten your finger.  Feeling a catching or locking sensation when you try to straighten your finger.  Being unable to straighten your finger. How is this diagnosed? This condition is diagnosed based on your symptoms and a physical exam. How is this treated? This condition may be treated by:  Resting your finger and avoiding activities that make symptoms worse.  Wearing a finger splint to keep your finger extended.  Taking NSAIDs, such as ibuprofen, to relieve pain and swelling.  Doing gentle exercises to stretch the finger as told  by your health care provider.  Having medicine that reduces swelling and inflammation (steroids) injected into the tendon sheath. Injections may need to be repeated.  Having surgery to open the tendon sheath. This may be done if other treatments do not work and you cannot straighten your finger. You may need physical therapy after surgery. Follow these instructions at home: If you have a splint:  Wear the splint as told by your health care provider. Remove it only as told by your health care provider.  Loosen it if your fingers tingle, become numb, or turn cold and blue.  Keep it clean.  If the splint is not waterproof: ? Do not let it get wet. ? Cover it with a watertight covering when you take a bath or shower. Managing pain,  stiffness, and swelling If directed, apply heat to the affected area as often as told by your health care provider. Use the heat source that your health care provider recommends, such as a moist heat pack or a heating pad.  Place a towel between your skin and the heat source.  Leave the heat on for 20-30 minutes.  Remove the heat if your skin turns bright red. This is especially important if you are unable to feel pain, heat, or cold. You may have a greater risk of getting burned. If directed, put ice on the painful area. To do this:  If you have a removable splint, remove it as told by your health care provider.  Put ice in a plastic bag.  Place a towel between your skin and the bag or between your splint and the bag.  Leave the ice on for 20 minutes, 2-3 times a day.      Activity  Rest your finger as told by your health care provider. Avoid activities that make the pain worse.  Return to your normal activities as told by your health care provider. Ask your health care provider what activities are safe for you.  Do exercises as told by your health care provider.  Ask your health care provider when it is safe to drive if you have a splint on your hand. General instructions  Take over-the-counter and prescription medicines only as told by your health care provider.  Keep all follow-up visits as told by your health care provider. This is important. Contact a health care provider if:  Your symptoms are not improving with home care. Summary  Trigger finger, also called stenosing tenosynovitis, causes your finger to get stuck in a bent position. This can make it difficult and painful to straighten your finger.  This condition develops when a finger tendon or tendon sheath thickens.  Treatment may include resting your finger, wearing a splint, and taking medicines.  In severe cases, surgery to open the tendon sheath may be needed. This information is not intended to replace  advice given to you by your health care provider. Make sure you discuss any questions you have with your health care provider. Document Revised: 09/16/2018 Document Reviewed: 09/16/2018 Elsevier Patient Education  [place].

## 2020-10-13 NOTE — Assessment & Plan Note (Signed)
Lab Results  Component Value Date   TSH 1.48 03/08/2020   On Levothyroxine, f/u with Dr Dorris Fetch

## 2020-10-13 NOTE — Assessment & Plan Note (Signed)
Discussed about simple exercise, material provided Advised to see Hand surgeon, patient prefers to manage conservatively for now Tylenol PRN

## 2020-10-13 NOTE — Assessment & Plan Note (Signed)
Advised to use ankle brace/splint Referred to Podiatry Tylenol PRN

## 2020-10-13 NOTE — Progress Notes (Signed)
Acute Office Visit  Subjective:    Patient ID: Kara [name], female    DOB: 04-05-1946, 75 y.o.   MRN: [id number]  Chief Complaint  Patient presents with  . Hand Pain    Middle ringer on R hand, ongoing pain for several months. Stays swollen, it does lock up sometimes and she cannot bend it. Pain is worse at night.  . Foot Swelling    R foot swelling and painful, thinks it may be plantar fascitis. Ongoing for several months.    HPI Patient is in today for evaluation of leg swelling, right hand pain and right foot pain.  Leg swelling: Chronic, has been worse recently. Has tried compression socks. Did not like Lasix it was causing her to have squeezing pain in the flanks. She is willing to try HCTZ again.  Right hand pain: She has been having pain in right middle finger for last few months, which extends to the metacarpal area. Denies any recent injury. She has difficulty bending the finger and hears a popping sound when she extends it. Denies any numbness or weakness.  Right heel pain: Present for last few weeks, worse in the middle of the night when she tries to get up to use the rest room. Denies any recent injury. She has tried Tylenol with mild relief. Denies any numbness or tingling of the right foot.  Past Medical History:  Diagnosis Date  . Allergy    food allergies, drug allergies  . Anemia   . Anxiety   . Arthritis   . 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 Breast pain, left 02/25/2018  . Cataract   . Change in bowel function 04/09/2018  . Chronic pain of left knee 09/03/2019  . Colon polyps 08/23/2016  . COVID-19 virus infection 01/14/2020  . Dyspepsia 09/29/2016  . Family history of breast cancer   . Family history of cancer 08/23/2016   Aunt is [employer]  . Family history of prostate cancer   . Genetic testing 08/11/2019   Negative genetic testing on the [facility] 9-gene STAT panel.  The STAT Breast cancer panel offered by [facility] includes sequencing  and rearrangement analysis for the following 9 genes:  ATM, BRCA1, BRCA2, CDH1, CHEK2, PALB2, PTEN, STK11 and TP53.   The report date is August 09, 2019.  Marland Kitchen GERD (gastroesophageal reflux disease)   . H/O swallowed foreign body 11/20/2018  . Headache in back of head 12/31/2018  . Hip pain, acute, left 03/27/2019  . HLD (hyperlipidemia) 08/23/2016  . Hyperlipidemia   . Hypothyroid 08/14/2016  . Insomnia due to stress 12/23/2018  . 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 Perimenopausal vasomotor symptoms 02/11/2016  . Personal history of radiation therapy 2021   completed left breast radiation in April 2021  . Postmenopausal atrophic vaginitis 11/09/2015  . Pre-diabetes    no meds  . Prediabetes 08/30/2017  . Thyroid disease    Grave's  . Vaginal discharge 01/16/2019  . Weight loss, unintentional 03/27/2019    Past Surgical History:  Procedure Laterality Date  . ABDOMINAL HYSTERECTOMY     bleeding. fibroids  . BREAST BIOPSY Left 05/2019   left breast DCIS  . BREAST LUMPECTOMY Left 06/2019   DCIS  . BREAST LUMPECTOMY WITH RADIOACTIVE SEED LOCALIZATION Left 07/08/2019   Procedure: LEFT BREAST LUMPECTOMY WITH RADIOACTIVE SEED LOCALIZATION;  Surgeon: Griselda Miner, MD;  Location:  [facility];  Service: General;  Laterality: Left;  . BREAST SURGERY N/A    Phreesia 08/30/2020  . CESAREAN SECTION    . CESAREAN SECTION N/A    Phreesia 08/30/2020  . CHOLECYSTECTOMY    . COLONOSCOPY  2011   [place]: two 3-4 mm sessile polyps, hyperplastic, sigmoid diverticula, TI appeared normal.   . ESOPHAGOGASTRODUODENOSCOPY  2011   [place]: non-bleeding erosive gastropathy, normal duodenum. path: unremarkable duodenum, negative H.pylori, minimal esophagitis   . TONSILLECTOMY  1975    Family History  Problem Relation Age of Onset  . Breast cancer Maternal Grandmother        dx over 50  . Prostate  cancer Maternal Grandfather   . Arthritis Mother   . COPD Mother   . Breast cancer Mother 73       second at age 59  . Cancer Father        throat  . Stroke Father   . Cancer Maternal Aunt [id number] Lacks, cervical cancer  . Breast cancer Maternal Aunt        dx over 50  . Breast cancer Paternal Aunt        dx under 50  . Prostate cancer Maternal Uncle        dx over 50  . Cancer Other        father's maternal cousin was Henreietta Lacks, Cervical  . Cancer Maternal Uncle        unknown cancer  . Breast cancer Paternal Aunt        dx over 73  . Colon cancer Neg Hx   . Allergic rhinitis Neg Hx   . Angioedema Neg Hx   . Asthma Neg Hx   . Atopy Neg Hx   . Eczema Neg Hx   . Urticaria Neg Hx   . Immunodeficiency Neg Hx     Social History   Socioeconomic History  . Marital status: Divorced    Spouse name: Not on file  . Number of children: 2  . Years of education: 31  . Highest education level: Not on file  Occupational History  . Occupation: retired    Comment: Loss adjuster, chartered at [employer]  Tobacco Use  . Smoking status: Former Smoker    Quit date: 06/15/1998    Years since quitting: 22.3  . Smokeless tobacco: Never Used  . Tobacco comment: quit 2002  Vaping Use  . Vaping Use: Never used  Substance and Sexual Activity  . Alcohol use: No  . Drug use: No  . Sexual activity: Not Currently    Birth control/protection: Surgical    Comment: hyst  Other Topics Concern  . 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 Difficulty of Paying Living Expenses: Not hard at all  Food Insecurity: No Food Insecurity  . Worried About Charity fundraiser in the Last Year: Never true  . Ran Out of Food in the Last Year: Never true  Transportation Needs: No Transportation Needs  . Lack of Transportation (Medical): No  . Lack of Transportation (Non-Medical): No  Physical Activity: Sufficiently Active  . Days of Exercise per Week: 7 days  . Minutes of Exercise per Session: 30 min  Stress: No Stress Concern Present  . Feeling of Stress : Not at all  Social Connections: Moderately Isolated  . Frequency of Communication with Friends and Family: More than three times a week  . Frequency of Social Gatherings with Friends and Family: More than three times a week  . Attends Religious Services: Never  . Active Member of Clubs or Organizations: Yes  . Attends Archivist Meetings: More than 4 times per year  . Marital Status: Divorced  Human resources officer Violence: Not At Risk  . Fear of Current or Ex-Partner: No  . Emotionally Abused: No  . Physically Abused: No  . Sexually Abused: No    Outpatient Medications Prior to Visit  Medication Sig Dispense Refill  . acetaminophen (TYLENOL) 500 MG tablet Take 500 mg by mouth every 6 (six) hours as needed.    . Ascorbic Acid (VITAMIN C WITH ROSE HIPS) 500 MG tablet Take 500 mg by mouth daily.    . cholecalciferol (VITAMIN D3) 25 MCG (1000 UNIT) tablet Take 1,000 Units by mouth daily.    Marland Kitchen EPINEPHrine 0.3 mg/0.3 mL IJ SOAJ injection Inject 0.3 mLs (0.3 mg total) into the skin as needed. 2 each 2  . Multiple Vitamin (MULTIVITAMIN) tablet Take 1 tablet by mouth daily.    Marland Kitchen SYNTHROID 112 MCG tablet TAKE 1 TABLET(112 MCG) BY MOUTH DAILY 90 tablet 3  . albuterol (VENTOLIN HFA) 108 (90 Base) MCG/ACT inhaler Inhale 1-2 puffs into the lungs  every 6 (six) hours as needed for wheezing or shortness of breath. 8 g 1   No facility-administered medications prior to visit.    Allergies  Allergen Reactions  . Coconut (Cocos Nucifera) Allergy Skin Test Anaphylaxis  . Coconut Oil Anaphylaxis  . Prednisone Hypertension    Severe headache  . Other        . Naproxen Other (See Comments)    headache    Review of Systems  Constitutional: Positive for fatigue. Negative for chills and fever.  HENT: Negative for congestion, sinus pressure, sinus pain and sore throat.   Eyes: Negative for pain and discharge.  Respiratory: Negative for cough and shortness of breath.   Cardiovascular: Positive for leg swelling. Negative for chest pain and palpitations.  Gastrointestinal: Negative for abdominal pain, constipation, diarrhea, nausea and vomiting.  Endocrine: Negative for polydipsia and polyuria.  Genitourinary: Negative for dysuria and hematuria.  Musculoskeletal: Positive for arthralgias. Negative for neck pain and neck stiffness.  Skin: Negative for rash.  Neurological: Negative for dizziness and weakness.  Psychiatric/Behavioral: Negative for agitation and behavioral problems.       Objective:    Physical Exam Vitals reviewed.  Constitutional:      General: She is not in acute distress.    Appearance: She is obese. She is not diaphoretic.  HENT:     Head: Normocephalic and atraumatic.     Nose: Nose  normal. No congestion.     Mouth/Throat:     Mouth: Mucous membranes are moist.     Pharynx: No posterior oropharyngeal erythema.  Eyes:     General: No scleral icterus.    Extraocular Movements: Extraocular movements intact.  Cardiovascular:     Rate and Rhythm: Normal rate and regular rhythm.     Pulses: Normal pulses.     Heart sounds: Normal heart sounds. No murmur heard.   Pulmonary:     Breath sounds: Normal breath sounds. No wheezing or rales.  Abdominal:     Palpations: Abdomen is soft.     Tenderness: There is  no abdominal tenderness.  Musculoskeletal:     Cervical back: Neck supple. No tenderness.     Right lower leg: No edema.     Left lower leg: No edema.  Skin:    General: Skin is warm.     Findings: No rash.  Neurological:     General: No focal deficit present.     Mental Status: She is alert and oriented to person, place, and time.     Sensory: No sensory deficit.     Motor: No weakness.  Psychiatric:        Mood and Affect: Mood normal.        Behavior: Behavior normal.     BP 115/77 (BP Location: Right Arm, Patient Position: Sitting, Cuff Size: Large)   Pulse 80   Temp (!) 97.3 F (36.3 C) (Temporal)   Ht $R'5\' 6"'Re$  (1.676 m)   Wt 236 lb (107 kg)   SpO2 98%   BMI 38.09 kg/m  Wt Readings from Last 3 Encounters:  10/13/20 236 lb (107 kg)  06/30/20 229 lb (103.9 kg)  06/14/20 229 lb (103.9 kg)    Health Maintenance Due  Topic Date Due  . FOOT EXAM  Never done  . Zoster Vaccines- Shingrix (1 of 2) Never done  . URINE MICROALBUMIN  08/17/2018  . HEMOGLOBIN A1C  03/09/2020    There are no preventive care reminders to display for this patient.   Lab Results  Component Value Date   TSH 1.48 03/08/2020   Lab Results  Component Value Date   WBC 5.3 10/22/2019   HGB 13.8 10/22/2019   HCT 43.5 10/22/2019   MCV 84.6 10/22/2019   PLT 189 10/22/2019   Lab Results  Component Value Date   NA 139 03/08/2020   K 4.3 03/08/2020   CO2 25 03/08/2020   GLUCOSE 107 (H) 03/08/2020   BUN 19 03/08/2020   CREATININE 0.75 03/08/2020   BILITOT 0.6 03/08/2020   ALKPHOS 95 01/19/2019   AST 18 03/08/2020   ALT 15 03/08/2020   PROT 6.8 03/08/2020   ALBUMIN 3.5 01/19/2019   CALCIUM 9.3 03/08/2020   ANIONGAP 7 01/19/2019   Lab Results  Component Value Date   CHOL 194 10/22/2019   Lab Results  Component Value Date   HDL 64 10/22/2019   Lab Results  Component Value Date   LDLCALC 114 (H) 10/22/2019   Lab Results  Component Value Date   TRIG 68 10/22/2019   Lab Results   Component Value Date   CHOLHDL 3.0 10/22/2019   Lab Results  Component Value Date   HGBA1C 5.8 (A) 09/08/2019       Assessment & Plan:   Problem List Items Addressed This Visit      Endocrine   Hypothyroidism following radioiodine therapy    Lab Results  Component Value Date  TSH 1.48 03/08/2020   On Levothyroxine, f/u with Dr Dorris Fetch      Relevant Orders   CBC   CMP14+EGFR   Lipid panel   TSH + free T4     Musculoskeletal and Integument   Plantar fasciitis of right foot - Primary    Advised to use ankle brace/splint Referred to Podiatry Tylenol PRN      Relevant Orders   Ambulatory referral to Podiatry   Trigger middle finger of right hand    Discussed about simple exercise, material provided Advised to see Hand surgeon, patient prefers to manage conservatively for now Tylenol PRN        Other   Shortness of breath    Had it during Post-COVID phase Albuterol PRN      Relevant Medications   albuterol (VENTOLIN HFA) 108 (90 Base) MCG/ACT inhaler    Other Visit Diagnoses    Leg swelling    Leg elevation Compression socks Weight loss might be helpful. HCTZ PRN    Relevant Medications   hydrochlorothiazide (MICROZIDE) 12.5 MG capsule       Meds ordered this encounter  Medications  . hydrochlorothiazide (MICROZIDE) 12.5 MG capsule    Sig: Take 1 capsule (12.5 mg total) by mouth daily as needed (Leg swelling).    Dispense:  30 capsule    Refill:  2  . albuterol (VENTOLIN HFA) 108 (90 Base) MCG/ACT inhaler    Sig: Inhale 1-2 puffs into the lungs every 6 (six) hours as needed for wheezing or shortness of breath.    Dispense:  8 g    Refill:  1     [place] Nishi Keith Rake, MD

## 2020-10-20 ENCOUNTER — Other Ambulatory Visit: Payer: Self-pay

## 2020-10-20 ENCOUNTER — Other Ambulatory Visit: Payer: Self-pay | Admitting: Internal Medicine

## 2020-10-20 ENCOUNTER — Encounter: Payer: Self-pay | Admitting: Podiatry

## 2020-10-20 ENCOUNTER — Ambulatory Visit (INDEPENDENT_AMBULATORY_CARE_PROVIDER_SITE_OTHER): Payer: Medicare Other

## 2020-10-20 ENCOUNTER — Ambulatory Visit (INDEPENDENT_AMBULATORY_CARE_PROVIDER_SITE_OTHER): Payer: Medicare Other | Admitting: Podiatry

## 2020-10-20 ENCOUNTER — Other Ambulatory Visit: Payer: Self-pay | Admitting: Podiatry

## 2020-10-20 DIAGNOSIS — M79672 Pain in left foot: Secondary | ICD-10-CM

## 2020-10-20 DIAGNOSIS — M722 Plantar fascial fibromatosis: Secondary | ICD-10-CM

## 2020-10-20 DIAGNOSIS — M7750 Other enthesopathy of unspecified foot: Secondary | ICD-10-CM

## 2020-10-20 DIAGNOSIS — M7989 Other specified soft tissue disorders: Secondary | ICD-10-CM

## 2020-10-20 DIAGNOSIS — M79671 Pain in right foot: Secondary | ICD-10-CM

## 2020-10-20 MED ORDER — TRIAMCINOLONE ACETONIDE 10 MG/ML IJ SUSP
10.0000 mg | Freq: Once | INTRAMUSCULAR | Status: AC
  Administered 2020-10-20: 10 mg

## 2020-10-20 NOTE — Progress Notes (Signed)
Subjective:   Patient ID: Kara [name], female   DOB: 75 y.o.   MRN: [id number]   HPI Patient presents stating she has had a lot of pain in her right heel 2 months in duration and has had swelling in her lower legs 20 years in duration with gradual worsening of the condition over that time.  Patient states the heel is very tender in the medial band at insertion and patient does not smoke likes to be active   Review of Systems  All other systems reviewed and are negative.       Objective:  Physical Exam Vitals and nursing note reviewed.  Constitutional:      Appearance: She is well-developed.  Pulmonary:     Effort: Pulmonary effort is normal.  Musculoskeletal:        General: Normal range of motion.  Skin:    General: Skin is warm.  Neurological:     Mental Status: She is alert.     Neurovascular status found to be intact muscle strength found to be adequate with chronic swelling of the lower leg bilateral with history of Lasix use and compression stockings.  Patient right plantar fascial very tender H&P x-rays reviewed and today I did sterile prep the insertion of the tendon into the calcaneus with inflammation fluid around the medial band     Assessment:  Acute Planter fasciitis right chronic swelling both feet     Plan:  H&P performed x-rays reviewed today I did sterile prep and injected the fascia 3 mg Kenalog 5 mg Xylocaine and instructed on supportive shoe gear usage.  Had numerous questions concerning swelling and with 20-year history I do not see anything realistic that we can do to help her even though I did recommend she consider vein clinics even though I am not sure if they could help her either.  Reappoint in the next several weeks to reevaluate  X-rays indicate spur formation no indications of stress fracture arthritis

## 2020-10-20 NOTE — Patient Instructions (Signed)

## 2020-10-21 DIAGNOSIS — H40021 Open angle with borderline findings, high risk, right eye: Secondary | ICD-10-CM | POA: Diagnosis not present

## 2020-10-21 DIAGNOSIS — H25013 Cortical age-related cataract, bilateral: Secondary | ICD-10-CM | POA: Diagnosis not present

## 2020-10-21 DIAGNOSIS — H401221 Low-tension glaucoma, left eye, mild stage: Secondary | ICD-10-CM | POA: Diagnosis not present

## 2020-10-21 DIAGNOSIS — H2513 Age-related nuclear cataract, bilateral: Secondary | ICD-10-CM | POA: Diagnosis not present

## 2020-10-21 DIAGNOSIS — H2512 Age-related nuclear cataract, left eye: Secondary | ICD-10-CM | POA: Diagnosis not present

## 2020-10-21 DIAGNOSIS — R7309 Other abnormal glucose: Secondary | ICD-10-CM | POA: Diagnosis not present

## 2020-10-25 DIAGNOSIS — E890 Postprocedural hypothyroidism: Secondary | ICD-10-CM | POA: Diagnosis not present

## 2020-10-25 DIAGNOSIS — L821 Other seborrheic keratosis: Secondary | ICD-10-CM | POA: Diagnosis not present

## 2020-10-26 ENCOUNTER — Ambulatory Visit (INDEPENDENT_AMBULATORY_CARE_PROVIDER_SITE_OTHER): Payer: Medicare Other | Admitting: Urology

## 2020-10-26 ENCOUNTER — Encounter: Payer: Self-pay | Admitting: Urology

## 2020-10-26 ENCOUNTER — Other Ambulatory Visit: Payer: Self-pay

## 2020-10-26 VITALS — BP 96/63 | HR 89

## 2020-10-26 DIAGNOSIS — R3911 Hesitancy of micturition: Secondary | ICD-10-CM

## 2020-10-26 LAB — CMP14+EGFR
ALT: 22 IU/L (ref 0–32)
AST: 23 IU/L (ref 0–40)
Albumin/Globulin Ratio: 1.3 (ref 1.2–2.2)
Albumin: 3.9 g/dL (ref 3.7–4.7)
Alkaline Phosphatase: 133 IU/L — ABNORMAL HIGH (ref 44–121)
BUN/Creatinine Ratio: 20 (ref 12–28)
BUN: 16 mg/dL (ref 8–27)
Bilirubin Total: 0.3 mg/dL (ref 0.0–1.2)
CO2: 20 mmol/L (ref 20–29)
Calcium: 9.3 mg/dL (ref 8.7–10.3)
Chloride: 104 mmol/L (ref 96–106)
Creatinine, Ser: 0.79 mg/dL (ref 0.57–1.00)
Globulin, Total: 3 g/dL (ref 1.5–4.5)
Glucose: 108 mg/dL — ABNORMAL HIGH (ref 65–99)
Potassium: 4.4 mmol/L (ref 3.5–5.2)
Sodium: 139 mmol/L (ref 134–144)
Total Protein: 6.9 g/dL (ref 6.0–8.5)
eGFR: 79 mL/min/{1.73_m2} (ref >59)

## 2020-10-26 LAB — CBC
Hematocrit: 42.3 % (ref 34.0–46.6)
Hemoglobin: 13.5 g/dL (ref 11.1–15.9)
MCH: 26.3 pg — ABNORMAL LOW (ref 26.6–33.0)
MCHC: 31.9 g/dL (ref 31.5–35.7)
MCV: 82 fL (ref 79–97)
Platelets: 203 10*3/uL (ref 150–450)
RBC: 5.14 x10E6/uL (ref 3.77–5.28)
RDW: 15.7 % — ABNORMAL HIGH (ref 11.7–15.4)
WBC: 6.8 10*3/uL (ref 3.4–10.8)

## 2020-10-26 LAB — LIPID PANEL
Chol/HDL Ratio: 3.2 ratio (ref 0.0–4.4)
Cholesterol, Total: 193 mg/dL (ref 100–199)
HDL: 60 mg/dL (ref >39)
LDL Chol Calc (NIH): 123 mg/dL — ABNORMAL HIGH (ref 0–99)
Triglycerides: 51 mg/dL (ref 0–149)
VLDL Cholesterol Cal: 10 mg/dL (ref 5–40)

## 2020-10-26 LAB — TSH+FREE T4
Free T4: 1.56 ng/dL (ref 0.82–1.77)
TSH: 3.34 u[IU]/mL (ref 0.450–4.500)

## 2020-10-26 LAB — BLADDER SCAN AMB NON-IMAGING: Scan Result: 0

## 2020-10-26 MED ORDER — MIRABEGRON ER 25 MG PO TB24: 25.0000 mg | ORAL_TABLET | Freq: Every day | ORAL | 0 refills | Status: DC

## 2020-10-26 NOTE — Patient Instructions (Signed)

## 2020-10-26 NOTE — Progress Notes (Signed)
10/26/2020 2:13 PM   Kara [name] 1946/03/16 [id number]  Referring provider: Noreene Larsson, NP [address]  [address] [place],  [place] [address]  Urinary frequency  HPI: Kara [name] is a 75yo here for evaluation of urinary frequency. Starting over 1 year ago she developed urinary frequency every hour which is normally small volumes. Nocturia 3-4x. She has associated urgency but rare urge incontinence. She has suprapubic pain and pressure when she feels she needs to void. She denies dysuria or hematuria. PVR 0cc.  She has hx of hysterectomy in 1998.   PMH: Past Medical History:  Diagnosis Date   Allergy    food allergies, drug allergies   Anemia    Anxiety    Arthritis    Breast cancer ([place]) 05/2019   left breast DCIS   Breast pain, left 02/25/2018   Cataract    Change in bowel function 04/09/2018   Chronic pain of left knee 09/03/2019   Colon polyps 08/23/2016   COVID-19 virus infection 01/14/2020   Dyspepsia 09/29/2016   Family history of breast cancer    Family history of cancer 08/23/2016   Aunt is Kara [name]   Family history of prostate cancer    Genetic testing 08/11/2019   Negative genetic testing on the Kara [name] 9-gene STAT panel.  The STAT Breast cancer panel offered by Kara [name] includes sequencing and rearrangement analysis for the following 9 genes:  ATM, BRCA1, BRCA2, CDH1, CHEK2, PALB2, PTEN, STK11 and TP53.   The report date is August 09, 2019.   GERD (gastroesophageal reflux disease)    H/O swallowed foreign body 11/20/2018   Headache in back of head 12/31/2018   Hip pain, acute, left 03/27/2019   HLD (hyperlipidemia) 08/23/2016   Hyperlipidemia    Hypothyroid 08/14/2016   Insomnia due to stress 12/23/2018   Malignant neoplasm of upper-outer quadrant of left breast in female, estrogen receptor negative ([place]) 06/11/2019   DCIS left breast   Perimenopausal vasomotor symptoms 02/11/2016   Personal history of radiation therapy 2021   completed left breast  radiation in April 2021   Postmenopausal atrophic vaginitis 11/09/2015   Pre-diabetes    no meds   Prediabetes 08/30/2017   Thyroid disease    Grave's   Vaginal discharge 01/16/2019   Weight loss, unintentional 03/27/2019    Surgical History: Past Surgical History:  Procedure Laterality Date   ABDOMINAL HYSTERECTOMY     bleeding. fibroids   BREAST BIOPSY Left 05/2019   left breast DCIS   BREAST LUMPECTOMY Left 06/2019   DCIS   BREAST LUMPECTOMY WITH RADIOACTIVE SEED LOCALIZATION Left 07/08/2019   Procedure: LEFT BREAST LUMPECTOMY WITH RADIOACTIVE SEED LOCALIZATION;  Surgeon: Kara Kussmaul, MD;  Location: [facility];  Service: General;  Laterality: Left;   BREAST SURGERY N/A    Phreesia 08/30/2020   CESAREAN SECTION     CESAREAN SECTION N/A    Phreesia 08/30/2020   CHOLECYSTECTOMY     COLONOSCOPY  2011   [place]: two 3-4 mm sessile polyps, hyperplastic, sigmoid diverticula, TI appeared normal.    ESOPHAGOGASTRODUODENOSCOPY  2011   [place]: non-bleeding erosive gastropathy, normal duodenum. path: unremarkable duodenum, negative H.pylori, minimal esophagitis    TONSILLECTOMY  1975    Home Medications:  Allergies as of 10/26/2020       Reactions   Coconut (cocos Nucifera) Allergy Skin Test Anaphylaxis   Coconut Oil Anaphylaxis   Prednisone Hypertension   Severe headache   Other  Naproxen Other (See Comments)   headache        Medication List        Accurate as of October 26, 2020  2:13 PM. If you have any questions, ask your nurse or doctor.          acetaminophen 500 MG tablet Commonly known as: TYLENOL Take 500 mg by mouth every 6 (six) hours as needed.   albuterol 108 (90 Base) MCG/ACT inhaler Commonly known as: VENTOLIN HFA Inhale 1-2 puffs into the lungs every 6 (six) hours as needed for wheezing or shortness of breath.   cholecalciferol 25 MCG (1000 UNIT) tablet Commonly known as: VITAMIN D3 Take 1,000 Units by mouth daily.    EPINEPHrine 0.3 mg/0.3 mL Soaj injection Commonly known as: EPI-PEN Inject 0.3 mLs (0.3 mg total) into the skin as needed.   hydrochlorothiazide 12.5 MG capsule Commonly known as: Microzide Take 1 capsule (12.5 mg total) by mouth daily as needed (Leg swelling).   multivitamin tablet Take 1 tablet by mouth daily.   Synthroid 112 MCG tablet Generic drug: levothyroxine TAKE 1 TABLET(112 MCG) BY MOUTH DAILY   vitamin C with rose hips 500 MG tablet Take 500 mg by mouth daily.        Allergies:  Allergies  Allergen Reactions   Coconut (Cocos Nucifera) Allergy Skin Test Anaphylaxis   Coconut Oil Anaphylaxis   Prednisone Hypertension    Severe headache   Other         Naproxen Other (See Comments)    headache    Family History: Family History  Problem Relation Age of Onset   Breast cancer Maternal Grandmother        dx over 2   Prostate cancer Maternal Grandfather    Arthritis Mother    COPD Mother    Breast cancer Mother 43       second at age 21   Cancer Father        throat   Stroke Father    Cancer Maternal Aunt 40       Kara [name], cervical cancer   Breast cancer Maternal Aunt        dx over 24   Breast cancer Paternal Aunt        dx under 80   Prostate cancer Maternal Uncle        dx over 56   Cancer Other        father's maternal cousin was Henreietta [name], Cervical   Cancer Maternal Uncle        unknown cancer   Breast cancer Paternal Aunt        dx over 50   Colon cancer Neg Hx    Allergic rhinitis Neg Hx    Angioedema Neg Hx    Asthma Neg Hx    Atopy Neg Hx    Eczema Neg Hx    Urticaria Neg Hx    Immunodeficiency Neg Hx     Social History:  reports that she quit smoking about 22 years ago. She has never used smokeless tobacco. She reports that she does not drink alcohol and does not use drugs.  ROS: All other review of systems were reviewed and are negative except what is noted above in HPI  Physical Exam: BP 96/63   Pulse 89    Constitutional:  Alert and oriented, No acute distress. HEENT: Kara [name] AT, moist mucus membranes.  Trachea midline, no masses. Cardiovascular: No clubbing, cyanosis, or edema. Respiratory: Normal respiratory effort, no  increased work of breathing. GI: Abdomen is soft, nontender, nondistended, no abdominal masses GU: No CVA tenderness.  Lymph: No cervical or inguinal lymphadenopathy. Skin: No rashes, bruises or suspicious lesions. Neurologic: Grossly intact, no focal deficits, moving all 4 extremities. Psychiatric: Normal mood and affect.  Laboratory Data: Lab Results  Component Value Date   WBC 6.8 10/25/2020   HGB 13.5 10/25/2020   HCT 42.3 10/25/2020   MCV 82 10/25/2020   PLT 203 10/25/2020    Lab Results  Component Value Date   CREATININE 0.79 10/25/2020    No results found for: PSA  No results found for: TESTOSTERONE  Lab Results  Component Value Date   HGBA1C 5.8 (A) 09/08/2019    Urinalysis    Component Value Date/Time   COLORURINE YELLOW 08/04/2017 1526   APPEARANCEUR HAZY (A) 08/04/2017 1526   LABSPEC 1.019 08/04/2017 1526   PHURINE 5.0 08/04/2017 1526   GLUCOSEU NEGATIVE 08/04/2017 1526   HGBUR NEGATIVE 08/04/2017 1526   BILIRUBINUR negative 08/30/2020 1539   KETONESUR negative 12/30/2019 1455   [place] 08/04/2017 1526   PROTEINUR Negative 08/30/2020 1539   PROTEINUR NEGATIVE 08/04/2017 1526   UROBILINOGEN 0.2 08/30/2020 1539   NITRITE negative 08/30/2020 1539   NITRITE NEGATIVE 08/04/2017 1526   LEUKOCYTESUR Negative 08/30/2020 1539    No results found for: LABMICR, WBCUA, RBCUA, LABEPIT, MUCUS, BACTERIA  Pertinent Imaging:  No results found for this or any previous visit.  No results found for this or any previous visit.  No results found for this or any previous visit.  No results found for this or any previous visit.  No results found for this or any previous visit.  No results found for this or any previous visit.  No  results found for this or any previous visit.  No results found for this or any previous visit.   Assessment & Plan:    1. Overactive Bladder -We will trial mirabegron $RemoveBeforeDE'25mg'[id number]$  daily - Urinalysis, Routine w reflex microscopic - BLADDER SCAN AMB NON-IMAGING   No follow-ups on file.  Nicolette Bang, MD  [facility] [place]

## 2020-10-26 NOTE — Progress Notes (Signed)
post void residual=0  Urological Symptom Review  Patient is experiencing the following symptoms: Hard to postpone urination Get up at night to urinate Leakage of urine   Review of Systems  Gastrointestinal (upper)  : Negative for upper GI symptoms  Gastrointestinal (lower) : Negative for lower GI symptoms  Constitutional : Fatigue  Skin: Negative for skin symptoms  Eyes: Negative for eye symptoms  Ear/Nose/Throat : Negative for Ear/Nose/Throat symptoms  Hematologic/Lymphatic: Negative for Hematologic/Lymphatic symptoms  Cardiovascular : Leg swelling  Respiratory : Negative for respiratory symptoms  Endocrine: Negative for endocrine symptoms  Musculoskeletal: Joint pain  Neurological: Negative for neurological symptoms  Psychologic: Negative for psychiatric symptoms

## 2020-10-27 LAB — URINALYSIS, ROUTINE W REFLEX MICROSCOPIC
Bilirubin, UA: NEGATIVE
Glucose, UA: NEGATIVE
Ketones, UA: NEGATIVE
Leukocytes,UA: NEGATIVE
Nitrite, UA: NEGATIVE
Protein,UA: NEGATIVE
Specific Gravity, UA: 1.03 (ref 1.005–1.030)
Urobilinogen, Ur: 0.2 mg/dL (ref 0.2–1.0)
pH, UA: 5 (ref 5.0–7.5)

## 2020-10-27 LAB — MICROSCOPIC EXAMINATION

## 2020-10-28 ENCOUNTER — Ambulatory Visit: Payer: Medicare Other | Admitting: Internal Medicine

## 2020-11-01 LAB — HM DIABETES EYE EXAM

## 2020-11-03 DIAGNOSIS — H2512 Age-related nuclear cataract, left eye: Secondary | ICD-10-CM | POA: Diagnosis not present

## 2020-11-03 DIAGNOSIS — H25812 Combined forms of age-related cataract, left eye: Secondary | ICD-10-CM | POA: Diagnosis not present

## 2020-11-08 ENCOUNTER — Encounter: Payer: Self-pay | Admitting: Podiatry

## 2020-11-08 ENCOUNTER — Other Ambulatory Visit: Payer: Self-pay

## 2020-11-08 ENCOUNTER — Ambulatory Visit (INDEPENDENT_AMBULATORY_CARE_PROVIDER_SITE_OTHER): Payer: Medicare Other | Admitting: Podiatry

## 2020-11-08 DIAGNOSIS — M722 Plantar fascial fibromatosis: Secondary | ICD-10-CM | POA: Diagnosis not present

## 2020-11-08 NOTE — Progress Notes (Signed)
Subjective:   Patient ID: Kara [name], female   DOB: 75 y.o.   MRN: [id number]   HPI Patient states she still having a lot of pain when she walks and states that the splint is helping her when in bed or sitting but the pain is quite intense with ambulation   ROS      Objective:  Physical Exam  Neurovascular status intact with pain found at the insertion of the plantar fascial right still present with patient stated she did not get a good response to the injection.  States night splint helps but she is not using ice like we have discussed     Assessment:  Acute [place] fasciitis with moderate obesity is complicating factor and flatfoot deformity right noted     Plan:  H&P reviewed condition at great length and discussed the importance of ice continue night splint usage and dispensed air fracture walker as her pain is present with ambulation and standing.  I discussed other possible treatments in future we will see how this does and make decisions based on response

## 2020-12-08 ENCOUNTER — Ambulatory Visit: Payer: Medicare Other | Admitting: Urology

## 2021-01-08 ENCOUNTER — Other Ambulatory Visit (INDEPENDENT_AMBULATORY_CARE_PROVIDER_SITE_OTHER): Payer: Self-pay | Admitting: Nurse Practitioner

## 2021-01-08 DIAGNOSIS — I959 Hypotension, unspecified: Secondary | ICD-10-CM

## 2021-01-11 ENCOUNTER — Encounter: Payer: Self-pay | Admitting: Family Medicine

## 2021-01-12 ENCOUNTER — Other Ambulatory Visit: Payer: Self-pay

## 2021-01-12 DIAGNOSIS — I10 Essential (primary) hypertension: Secondary | ICD-10-CM

## 2021-01-12 MED ORDER — UNABLE TO FIND: 0 refills | Status: DC

## 2021-01-20 ENCOUNTER — Other Ambulatory Visit: Payer: Self-pay

## 2021-01-20 ENCOUNTER — Ambulatory Visit (INDEPENDENT_AMBULATORY_CARE_PROVIDER_SITE_OTHER): Payer: Medicare Other | Admitting: Internal Medicine

## 2021-01-20 ENCOUNTER — Ambulatory Visit (HOSPITAL_COMMUNITY)
Admission: RE | Admit: 2021-01-20 | Discharge: 2021-01-20 | Disposition: A | Discharge status: Home / Self Care | Payer: Medicare Other | Source: Ambulatory Visit | Attending: Internal Medicine | Admitting: Internal Medicine

## 2021-01-20 ENCOUNTER — Encounter: Payer: Self-pay | Admitting: Internal Medicine

## 2021-01-20 VITALS — BP 100/65 | HR 95 | Temp 98.3°F | Resp 16 | Ht 66.0 in | Wt 239.0 lb

## 2021-01-20 DIAGNOSIS — M722 Plantar fascial fibromatosis: Secondary | ICD-10-CM | POA: Diagnosis not present

## 2021-01-20 DIAGNOSIS — M7062 Trochanteric bursitis, left hip: Secondary | ICD-10-CM

## 2021-01-20 DIAGNOSIS — Z23 Encounter for immunization: Secondary | ICD-10-CM | POA: Diagnosis not present

## 2021-01-20 DIAGNOSIS — R102 Pelvic and perineal pain: Secondary | ICD-10-CM | POA: Diagnosis not present

## 2021-01-20 DIAGNOSIS — M25552 Pain in left hip: Secondary | ICD-10-CM | POA: Insufficient documentation

## 2021-01-20 MED ORDER — IBUPROFEN 600 MG PO TABS: 600.0000 mg | ORAL_TABLET | Freq: Three times a day (TID) | ORAL | 0 refills | Status: DC | PRN

## 2021-01-20 NOTE — Assessment & Plan Note (Signed)
Advised to use ankle brace/splint Referred to Podiatry -had steroid injection Ibuprofen PRN

## 2021-01-20 NOTE — Progress Notes (Signed)
Acute Office Visit  Subjective:    Patient ID: Don Giarrusso, female    DOB: April 17, 1946, 75 y.o.   MRN: [id number]  Chief Complaint  Patient presents with   Hip Pain    Left Hip pain has been going on for few months would like xray has not done anything to it that she knows not a constant pain when it comes its sharp and hip feels weak     HPI Patient is in today for c/o left hip pain, which is chronic and intermittent.  She states that she has a tender point on the lateral side of left hip.  She has more pain when she tries to sleep on the left side.  Pain is sharp, worse with walking and better with Aleve.  She feels weak due to the pain as well.  Denies any recent injury.  Denies any numbness or tingling in the LE.  She denies any low back pain currently.  She received flu vaccine in the office today. 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  The STAT Breast cancer panel offered by [facility] includes sequencing and rearrangement analysis for the following 9 genes:  ATM, BRCA1, BRCA2, CDH1, CHEK2, PALB2, PTEN, STK11 and TP53.   The report date is August 09, 2019.   GERD (gastroesophageal reflux disease)    H/O swallowed foreign body 11/20/2018   Headache in back of head 12/31/2018   Hip pain, acute, left 03/27/2019   HLD (hyperlipidemia) 08/23/2016   Hyperlipidemia    Hypothyroid 08/14/2016   Insomnia due to stress 12/23/2018   Malignant  neoplasm of upper-outer quadrant of left breast in female, estrogen receptor negative ([place]) 06/11/2019   DCIS left breast   Perimenopausal vasomotor symptoms 02/11/2016   Personal history of radiation therapy 2021   completed left breast radiation in April 2021   Postmenopausal atrophic vaginitis 11/09/2015   Pre-diabetes    no meds   Prediabetes 08/30/2017   Thyroid disease    Grave's   Vaginal discharge 01/16/2019   Weight loss, unintentional 03/27/2019    Past Surgical History:  Procedure Laterality Date   ABDOMINAL HYSTERECTOMY     bleeding. fibroids   BREAST BIOPSY Left 05/2019   left breast DCIS   BREAST LUMPECTOMY Left 06/2019   DCIS   BREAST LUMPECTOMY WITH RADIOACTIVE SEED LOCALIZATION Left 07/08/2019   Procedure: LEFT BREAST LUMPECTOMY WITH RADIOACTIVE SEED LOCALIZATION;  Surgeon: Jovita Kussmaul, MD;  Location: [facility];  Service: General;  Laterality: Left;   BREAST SURGERY N/A    [employer] 08/30/2020   CESAREAN SECTION     CESAREAN SECTION N/A    [employer] 08/30/2020   CHOLECYSTECTOMY     COLONOSCOPY  2011   [place]: two 3-4 mm sessile polyps, hyperplastic, sigmoid diverticula, TI appeared normal.  ESOPHAGOGASTRODUODENOSCOPY  2011   [place]: non-bleeding erosive gastropathy, normal duodenum. path: unremarkable duodenum, negative H.pylori, minimal esophagitis    TONSILLECTOMY  1975    Family History  Problem Relation Age of Onset   Breast cancer Maternal Grandmother        dx over 32   Prostate cancer Maternal Grandfather    Arthritis Mother    COPD Mother    Breast cancer Mother 65       second at age 68   Cancer Father        throat   Stroke Father    Cancer Maternal Aunt 38       [name], cervical cancer   Breast cancer Maternal Aunt        dx over 59   Breast cancer Paternal Aunt        dx under 68   Prostate cancer Maternal Uncle        dx over 98   Cancer Other        father's maternal cousin was Henreietta Lacks,  Cervical   Cancer Maternal Uncle        unknown cancer   Breast cancer Paternal Aunt        dx over 20   Colon cancer Neg Hx    Allergic rhinitis Neg Hx    Angioedema Neg Hx    Asthma Neg Hx    Atopy Neg Hx    Eczema Neg Hx    Urticaria Neg Hx    Immunodeficiency Neg Hx     Social History   Socioeconomic History   Marital status: Divorced    Spouse name: Not on file   Number of children: 2   Years of education: 15   Highest education level: Not on file  Occupational History   Occupation: retired    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  Tobacco Use   Smoking status: Former    Types: Cigarettes    Quit date: 06/15/1998    Years since quitting: 22.6   Smokeless tobacco: Never   Tobacco comments:    quit 2002  Vaping Use   Vaping Use: Never used  Substance and Sexual Activity   Alcohol use: No   Drug use: No   Sexual activity: Not Currently    Birth control/protection: Surgical    Comment: hyst  Other Topics Concern   Not on file  Social History Narrative   Lives alone   2 grown children- one in [place] and one here   Grandchildren       Moved to [place] from [place] for aging mother who is 55 with Alzheimers      Maternal Aunt is Stage manager of the [employer] cancer call line      Enjoy: gym, time with friends       Diet: eats all food groups   Caffeine: 2 diet cokes daily    Water: 2-4 cups daily       Wears seat belt    Does not use phone while driving    Smoke and carbon Careers adviser at home   Data processing manager    No weapons    Social Determinants of Health   Financial Resource Strain: Low Risk    Difficulty of Paying Living Expenses: Not hard at all  Food Insecurity: No Food Insecurity   Worried About Charity fundraiser in the Last Year: Never true   [place] in the Last  Year: Never true  Transportation Needs: No Transportation Needs   Lack of Transportation (Medical): No   Lack of Transportation (Non-Medical): No   Physical Activity: Sufficiently Active   Days of Exercise per Week: 7 days   Minutes of Exercise per Session: 30 min  Stress: No Stress Concern Present   Feeling of Stress : Not at all  Social Connections: Moderately Isolated   Frequency of Communication with Friends and Family: More than three times a week   Frequency of Social Gatherings with Friends and Family: More than three times a week   Attends Religious Services: Never   Marine scientist or Organizations: Yes   Attends Music therapist: More than 4 times per year   Marital Status: Divorced  Human resources officer Violence: Not on file    Outpatient Medications Prior to Visit  Medication Sig Dispense Refill   acetaminophen (TYLENOL) 500 MG tablet Take 500 mg by mouth every 6 (six) hours as needed.     albuterol (VENTOLIN HFA) 108 (90 Base) MCG/ACT inhaler Inhale 1-2 puffs into the lungs every 6 (six) hours as needed for wheezing or shortness of breath. 8 g 1   Ascorbic Acid (VITAMIN C WITH ROSE HIPS) 500 MG tablet Take 500 mg by mouth daily.     cholecalciferol (VITAMIN D3) 25 MCG (1000 UNIT) tablet Take 1,000 Units by mouth daily.     EPINEPHrine 0.3 mg/0.3 mL IJ SOAJ injection Inject 0.3 mLs (0.3 mg total) into the skin as needed. 2 each 2   SYNTHROID 112 MCG tablet TAKE 1 TABLET(112 MCG) BY MOUTH DAILY 90 tablet 3   UNABLE TO FIND Med Name: Blood pressure cuff 1 Product 0   hydrochlorothiazide (MICROZIDE) 12.5 MG capsule Take 1 capsule (12.5 mg total) by mouth daily as needed (Leg swelling). 30 capsule 2   mirabegron ER (MYRBETRIQ) 25 MG TB24 tablet Take 1 tablet (25 mg total) by mouth daily. 30 tablet 0   Multiple Vitamin (MULTIVITAMIN) tablet Take 1 tablet by mouth daily.     No facility-administered medications prior to visit.    Allergies  Allergen Reactions   Coconut (Cocos Nucifera) Allergy Skin Test Anaphylaxis   Coconut Oil Anaphylaxis   Prednisone Hypertension    Severe headache   Other          Naproxen Other (See Comments)    headache    Review of Systems  Constitutional:  Negative for chills and fever.  Respiratory:  Negative for cough and shortness of breath.   Cardiovascular:  Negative for chest pain and palpitations.  Musculoskeletal:  Positive for arthralgias. Negative for back pain.  Skin:  Negative for rash.      Objective:    Physical Exam Vitals reviewed.  Constitutional:      General: She is not in acute distress.    Appearance: She is obese. She is not diaphoretic.  HENT:     Head: Normocephalic and atraumatic.     Nose: Nose normal. No congestion.     Mouth/Throat:     Mouth: Mucous membranes are moist.     Pharynx: No posterior oropharyngeal erythema.  Eyes:     General: No scleral icterus.    Extraocular Movements: Extraocular movements intact.  Cardiovascular:     Rate and Rhythm: Normal rate and regular rhythm.     Pulses: Normal pulses.     Heart sounds: Normal heart sounds. No murmur heard. Pulmonary:     Breath sounds: Normal breath sounds. No  wheezing or rales.  Musculoskeletal:        General: Tenderness (Lateral part of left hip) present.     Cervical back: Neck supple. No tenderness.     Right lower leg: No edema.     Left lower leg: No edema.  Skin:    General: Skin is warm.     Findings: No rash.  Neurological:     General: No focal deficit present.     Mental Status: She is alert and oriented to person, place, and time.     Sensory: No sensory deficit.     Motor: No weakness.  Psychiatric:        Mood and Affect: Mood normal.        Behavior: Behavior normal.    BP 100/65 (BP Location: Left Arm, Patient Position: Sitting, Cuff Size: Normal)   Pulse 95   Temp 98.3 F (36.8 C) (Oral)   Resp 16   Ht $R'5\' 6"'rJ$  (1.676 m)   Wt 239 lb 0.6 oz (108.4 kg)   SpO2 97%   BMI 38.58 kg/m  Wt Readings from Last 3 Encounters:  01/20/21 239 lb 0.6 oz (108.4 kg)  10/13/20 236 lb (107 kg)  06/30/20 229 lb (103.9 kg)    Health  Maintenance Due  Topic Date Due   FOOT EXAM  Never done   Zoster Vaccines- Shingrix (1 of 2) Never done   URINE MICROALBUMIN  08/17/2018   HEMOGLOBIN A1C  03/09/2020    There are no preventive care reminders to display for this patient.   Lab Results  Component Value Date   TSH 3.340 10/25/2020   Lab Results  Component Value Date   WBC 6.8 10/25/2020   HGB 13.5 10/25/2020   HCT 42.3 10/25/2020   MCV 82 10/25/2020   PLT 203 10/25/2020   Lab Results  Component Value Date   NA 139 10/25/2020   K 4.4 10/25/2020   CO2 20 10/25/2020   GLUCOSE 108 (H) 10/25/2020   BUN 16 10/25/2020   CREATININE 0.79 10/25/2020   BILITOT 0.3 10/25/2020   ALKPHOS 133 (H) 10/25/2020   AST 23 10/25/2020   ALT 22 10/25/2020   PROT 6.9 10/25/2020   ALBUMIN 3.9 10/25/2020   CALCIUM 9.3 10/25/2020   ANIONGAP 7 01/19/2019   EGFR 79 10/25/2020   Lab Results  Component Value Date   CHOL 193 10/25/2020   Lab Results  Component Value Date   HDL 60 10/25/2020   Lab Results  Component Value Date   LDLCALC 123 (H) 10/25/2020   Lab Results  Component Value Date   TRIG 51 10/25/2020   Lab Results  Component Value Date   CHOLHDL 3.2 10/25/2020   Lab Results  Component Value Date   HGBA1C 5.8 (A) 09/08/2019       Assessment & Plan:   Problem List Items Addressed This Visit       Musculoskeletal and Integument   Trochanteric bursitis of left hip    Left hip pain likely due to trochanteric bursitis has she complains of point tendernessPrefers to wait for now for orthopedic surgery referral      Plantar fasciitis of right foot    Advised to use ankle brace/splint Referred to Podiatry -had steroid injection Ibuprofen PRN      Other Visit Diagnoses     Left hip pain    -  Primary Check x-ray to look for arthritic changes in addition to bursitis   Ibuprofen as needed Heating  pad or ice   Relevant Medications   ibuprofen (ADVIL) 600 MG tablet   Other Relevant Orders   DG  Pelvis 1-2 Views   Need for immunization against influenza       Relevant Orders   Flu Vaccine QUAD High Dose(Fluad) (Completed)        Meds ordered this encounter  Medications   ibuprofen (ADVIL) 600 MG tablet    Sig: Take 1 tablet (600 mg total) by mouth every 8 (eight) hours as needed.    Dispense:  30 tablet    Refill:  0     Sharra Cayabyab Keith Rake, MD

## 2021-01-20 NOTE — Assessment & Plan Note (Signed)
Left hip pain likely due to trochanteric bursitis has she complains of point tenderness Ibuprofen as needed Heating pad or ice Prefers to wait for now for orthopedic surgery referral

## 2021-02-03 ENCOUNTER — Encounter: Payer: Self-pay | Admitting: Podiatry

## 2021-02-03 ENCOUNTER — Other Ambulatory Visit: Payer: Self-pay

## 2021-02-03 ENCOUNTER — Ambulatory Visit (INDEPENDENT_AMBULATORY_CARE_PROVIDER_SITE_OTHER): Payer: Medicare Other | Admitting: Podiatry

## 2021-02-03 DIAGNOSIS — M722 Plantar fascial fibromatosis: Secondary | ICD-10-CM

## 2021-02-03 NOTE — Addendum Note (Signed)
Addended by: Joaquim Lai on: 02/03/2021 04:49 PM   Modules accepted: Orders

## 2021-02-03 NOTE — Progress Notes (Signed)
Subjective:   Patient ID: Kara [name], female   DOB: 75 y.o.   MRN: [id number]   HPI Patient presents stating she has had some improvement in her right plantar fascial but still very sore and admits she is not using the night splint as we had instructed or the boot.  Patient did have PRP injection performed   ROS      Objective:  Physical Exam  Neurovascular status intact with discomfort in the right plantar fascia moderate in intensity but still quite sore with moderate obesity is complicating factor with swelling of the lower legs secondary to lymphedema     Assessment:  Difficult problem due to moderate obesity and significant edema in the legs secondary to lymphedema condition with acute fascial symptoms still present     Plan:  H&P reviewed difficulty of condition and at this point organ to try physical therapy along with using the night splint appropriately with heat ice therapy.  I did discuss this at great length with her patient will be seen back to recheck is encouraged to call questions concerns and we will decide on what else may be done with second PRP injection or consideration of shockwave therapy if symptoms are persistent

## 2021-02-08 DIAGNOSIS — H401221 Low-tension glaucoma, left eye, mild stage: Secondary | ICD-10-CM | POA: Diagnosis not present

## 2021-02-08 DIAGNOSIS — H40021 Open angle with borderline findings, high risk, right eye: Secondary | ICD-10-CM | POA: Diagnosis not present

## 2021-02-15 DIAGNOSIS — R269 Unspecified abnormalities of gait and mobility: Secondary | ICD-10-CM | POA: Diagnosis not present

## 2021-02-15 DIAGNOSIS — M79671 Pain in right foot: Secondary | ICD-10-CM | POA: Diagnosis not present

## 2021-02-15 DIAGNOSIS — M25671 Stiffness of right ankle, not elsewhere classified: Secondary | ICD-10-CM | POA: Diagnosis not present

## 2021-02-15 DIAGNOSIS — M722 Plantar fascial fibromatosis: Secondary | ICD-10-CM | POA: Diagnosis not present

## 2021-02-16 ENCOUNTER — Encounter: Payer: Self-pay | Admitting: Internal Medicine

## 2021-02-17 DIAGNOSIS — M79671 Pain in right foot: Secondary | ICD-10-CM | POA: Diagnosis not present

## 2021-02-17 DIAGNOSIS — M722 Plantar fascial fibromatosis: Secondary | ICD-10-CM | POA: Diagnosis not present

## 2021-02-17 DIAGNOSIS — R269 Unspecified abnormalities of gait and mobility: Secondary | ICD-10-CM | POA: Diagnosis not present

## 2021-02-17 DIAGNOSIS — M25671 Stiffness of right ankle, not elsewhere classified: Secondary | ICD-10-CM | POA: Diagnosis not present

## 2021-02-21 DIAGNOSIS — D224 Melanocytic nevi of scalp and neck: Secondary | ICD-10-CM | POA: Diagnosis not present

## 2021-02-21 DIAGNOSIS — L821 Other seborrheic keratosis: Secondary | ICD-10-CM | POA: Diagnosis not present

## 2021-02-21 DIAGNOSIS — L680 Hirsutism: Secondary | ICD-10-CM | POA: Diagnosis not present

## 2021-02-22 DIAGNOSIS — R269 Unspecified abnormalities of gait and mobility: Secondary | ICD-10-CM | POA: Diagnosis not present

## 2021-02-22 DIAGNOSIS — M722 Plantar fascial fibromatosis: Secondary | ICD-10-CM | POA: Diagnosis not present

## 2021-02-22 DIAGNOSIS — M79671 Pain in right foot: Secondary | ICD-10-CM | POA: Diagnosis not present

## 2021-02-22 DIAGNOSIS — M25671 Stiffness of right ankle, not elsewhere classified: Secondary | ICD-10-CM | POA: Diagnosis not present

## 2021-02-25 ENCOUNTER — Encounter: Payer: Self-pay | Admitting: *Deleted

## 2021-02-25 DIAGNOSIS — M722 Plantar fascial fibromatosis: Secondary | ICD-10-CM | POA: Diagnosis not present

## 2021-02-25 DIAGNOSIS — M25671 Stiffness of right ankle, not elsewhere classified: Secondary | ICD-10-CM | POA: Diagnosis not present

## 2021-02-25 DIAGNOSIS — M79671 Pain in right foot: Secondary | ICD-10-CM | POA: Diagnosis not present

## 2021-02-25 DIAGNOSIS — R269 Unspecified abnormalities of gait and mobility: Secondary | ICD-10-CM | POA: Diagnosis not present

## 2021-03-01 ENCOUNTER — Other Ambulatory Visit: Payer: Self-pay

## 2021-03-01 DIAGNOSIS — E890 Postprocedural hypothyroidism: Secondary | ICD-10-CM

## 2021-03-01 DIAGNOSIS — R269 Unspecified abnormalities of gait and mobility: Secondary | ICD-10-CM | POA: Diagnosis not present

## 2021-03-01 DIAGNOSIS — M79671 Pain in right foot: Secondary | ICD-10-CM | POA: Diagnosis not present

## 2021-03-01 DIAGNOSIS — M722 Plantar fascial fibromatosis: Secondary | ICD-10-CM | POA: Diagnosis not present

## 2021-03-01 DIAGNOSIS — M25671 Stiffness of right ankle, not elsewhere classified: Secondary | ICD-10-CM | POA: Diagnosis not present

## 2021-03-02 DIAGNOSIS — E890 Postprocedural hypothyroidism: Secondary | ICD-10-CM | POA: Diagnosis not present

## 2021-03-03 DIAGNOSIS — M25671 Stiffness of right ankle, not elsewhere classified: Secondary | ICD-10-CM | POA: Diagnosis not present

## 2021-03-03 DIAGNOSIS — R269 Unspecified abnormalities of gait and mobility: Secondary | ICD-10-CM | POA: Diagnosis not present

## 2021-03-03 DIAGNOSIS — M79671 Pain in right foot: Secondary | ICD-10-CM | POA: Diagnosis not present

## 2021-03-03 DIAGNOSIS — M722 Plantar fascial fibromatosis: Secondary | ICD-10-CM | POA: Diagnosis not present

## 2021-03-03 LAB — TSH: TSH: 0.62 mIU/L (ref 0.40–4.50)

## 2021-03-03 LAB — T4, FREE: Free T4: 1.5 ng/dL (ref 0.8–1.8)

## 2021-03-07 DIAGNOSIS — M25671 Stiffness of right ankle, not elsewhere classified: Secondary | ICD-10-CM | POA: Diagnosis not present

## 2021-03-07 DIAGNOSIS — M722 Plantar fascial fibromatosis: Secondary | ICD-10-CM | POA: Diagnosis not present

## 2021-03-07 DIAGNOSIS — M79671 Pain in right foot: Secondary | ICD-10-CM | POA: Diagnosis not present

## 2021-03-07 DIAGNOSIS — R269 Unspecified abnormalities of gait and mobility: Secondary | ICD-10-CM | POA: Diagnosis not present

## 2021-03-09 DIAGNOSIS — M79671 Pain in right foot: Secondary | ICD-10-CM | POA: Diagnosis not present

## 2021-03-09 DIAGNOSIS — M25671 Stiffness of right ankle, not elsewhere classified: Secondary | ICD-10-CM | POA: Diagnosis not present

## 2021-03-09 DIAGNOSIS — R269 Unspecified abnormalities of gait and mobility: Secondary | ICD-10-CM | POA: Diagnosis not present

## 2021-03-09 DIAGNOSIS — M722 Plantar fascial fibromatosis: Secondary | ICD-10-CM | POA: Diagnosis not present

## 2021-03-10 ENCOUNTER — Ambulatory Visit: Payer: Medicare Other | Admitting: Podiatry

## 2021-03-11 ENCOUNTER — Ambulatory Visit (INDEPENDENT_AMBULATORY_CARE_PROVIDER_SITE_OTHER): Payer: Medicare Other | Admitting: "Endocrinology

## 2021-03-11 ENCOUNTER — Encounter: Payer: Self-pay | Admitting: "Endocrinology

## 2021-03-11 ENCOUNTER — Other Ambulatory Visit: Payer: Self-pay

## 2021-03-11 VITALS — BP 108/72 | HR 80 | Ht 66.0 in | Wt 238.0 lb

## 2021-03-11 DIAGNOSIS — E890 Postprocedural hypothyroidism: Secondary | ICD-10-CM

## 2021-03-11 DIAGNOSIS — R635 Abnormal weight gain: Secondary | ICD-10-CM | POA: Insufficient documentation

## 2021-03-11 DIAGNOSIS — E782 Mixed hyperlipidemia: Secondary | ICD-10-CM

## 2021-03-11 DIAGNOSIS — R7303 Prediabetes: Secondary | ICD-10-CM | POA: Diagnosis not present

## 2021-03-11 DIAGNOSIS — E559 Vitamin D deficiency, unspecified: Secondary | ICD-10-CM | POA: Diagnosis not present

## 2021-03-11 LAB — POCT GLYCOSYLATED HEMOGLOBIN (HGB A1C): HbA1c, POC (controlled diabetic range): 6.4 % (ref 0.0–7.0)

## 2021-03-11 MED ORDER — SYNTHROID 112 MCG PO TABS: ORAL_TABLET | ORAL | 1 refills | Status: DC

## 2021-03-11 NOTE — Progress Notes (Signed)
03/11/2021, 4:13 PM                          Endocrinology follow-up note    Kara [name] is a 75 y.o.-year-old female patient being seen in follow-up for management of  RAI induced hypothyroidism referred by Lindell Spar, MD.   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  The STAT Breast cancer panel offered by [facility] includes sequencing and rearrangement analysis for the following 9 genes:  ATM, BRCA1, BRCA2, CDH1, CHEK2, PALB2, PTEN, STK11 and TP53.   The report date is August 09, 2019.   GERD (gastroesophageal reflux disease)    H/O swallowed foreign body 11/20/2018   Headache in back of head 12/31/2018   Hip pain, acute, left 03/27/2019   HLD (hyperlipidemia) 08/23/2016   Hyperlipidemia    Hypothyroid 08/14/2016   Insomnia due to stress 12/23/2018   Malignant neoplasm of upper-outer quadrant of left breast in female, estrogen receptor negative ([place]) 06/11/2019   DCIS left breast   Perimenopausal vasomotor symptoms 02/11/2016   Personal history of radiation therapy 2021   completed left breast radiation in April 2021   Postmenopausal atrophic vaginitis 11/09/2015   Pre-diabetes    no meds   Prediabetes 08/30/2017   Thyroid disease    Grave's   Vaginal discharge 01/16/2019   Weight loss, unintentional 03/27/2019     Past Surgical History:  Procedure Laterality Date   ABDOMINAL HYSTERECTOMY     bleeding. fibroids   BREAST BIOPSY Left 05/2019   left breast DCIS   BREAST LUMPECTOMY Left 06/2019   DCIS   BREAST LUMPECTOMY WITH RADIOACTIVE SEED LOCALIZATION Left 07/08/2019   Procedure: LEFT BREAST LUMPECTOMY WITH RADIOACTIVE SEED LOCALIZATION;  Surgeon: Jovita Kussmaul, MD;  Location: [facility];  Service: General;  Laterality: Left;   BREAST SURGERY N/A    Phreesia 08/30/2020   CESAREAN SECTION     CESAREAN SECTION N/A    Phreesia 08/30/2020   CHOLECYSTECTOMY     COLONOSCOPY  2011   [place]: two 3-4 mm sessile polyps, hyperplastic, sigmoid diverticula, TI appeared normal.    ESOPHAGOGASTRODUODENOSCOPY  2011   [place]: non-bleeding erosive gastropathy, normal duodenum. 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  Tobacco Use   Smoking status: Former    Types: Cigarettes    Quit date: 06/15/1998    Years since quitting: 22.7   Smokeless tobacco: Never   Tobacco comments:    quit 2002  Vaping Use   Vaping Use: Never used  Substance and Sexual Activity   Alcohol use: No   Drug use: No   Sexual activity: Not Currently    Birth control/protection: Surgical    Comment: hyst  Other Topics Concern   Not on file  Social History Narrative   Lives alone   2 grown children- one in [place] and one here   Grandchildren       Moved to [place] from [place] for aging mother who is 36 with Alzheimers      Maternal Aunt is Stage manager of the [employer] cancer call line      Enjoy: gym, time with friends       Diet: eats all food groups    Caffeine: 2 diet cokes daily    Water: 2-4 cups daily       Wears seat belt    Does not use phone while driving    Smoke and carbon Careers adviser at home   Data processing manager    No weapons    Social Determinants of Health   Financial Resource Strain: Low Risk    Difficulty of Paying Living Expenses: Not hard at all  Food Insecurity: No Food Insecurity   Worried About Charity fundraiser in the Last Year: Never true   Arboriculturist in the Last Year: Never true  Transportation Needs: No Transportation Needs   Lack of Transportation (Medical): No   Lack of Transportation (Non-Medical): No  Physical Activity: Sufficiently Active   Days of Exercise per Week: 7 days   Minutes of Exercise per Session: 30 min  Stress: No Stress Concern Present   Feeling of Stress : Not at all  Social Connections: Moderately Isolated   Frequency of Communication with Friends and Family: More than three times a week   Frequency of Social Gatherings with Friends and Family: More than three times a week   Attends Religious Services: Never   Marine scientist or Organizations: Yes   Attends Music therapist: More than 4 times per year   Marital Status: Divorced    Family History  Problem Relation Age of Onset   Breast cancer Maternal Grandmother        dx over 27   Prostate cancer Maternal Grandfather    Arthritis Mother    COPD Mother    Breast cancer Mother 8       second at age 66   Cancer Father        throat   Stroke Father    Cancer Maternal Aunt 78       [place], cervical cancer   Breast cancer Maternal Aunt        dx over 68  Breast cancer Paternal Aunt        dx under 50   Prostate cancer Maternal Uncle        dx over 81   Cancer Other        father's maternal cousin was Henreietta Lacks, Cervical   Cancer Maternal Uncle        unknown cancer   Breast cancer Paternal Aunt        dx over 23   Colon cancer Neg Hx    Allergic rhinitis Neg Hx     Angioedema Neg Hx    Asthma Neg Hx    Atopy Neg Hx    Eczema Neg Hx    Urticaria Neg Hx    Immunodeficiency Neg Hx     Outpatient Encounter Medications as of 03/11/2021  Medication Sig   TURMERIC CURCUMIN PO Take 1 tablet by mouth 2 (two) times daily.   acetaminophen (TYLENOL) 500 MG tablet Take 500 mg by mouth every 6 (six) hours as needed.   albuterol (VENTOLIN HFA) 108 (90 Base) MCG/ACT inhaler Inhale 1-2 puffs into the lungs every 6 (six) hours as needed for wheezing or shortness of breath.   Ascorbic Acid (VITAMIN C WITH ROSE HIPS) 500 MG tablet Take 500 mg by mouth daily.   cholecalciferol (VITAMIN D3) 25 MCG (1000 UNIT) tablet Take 2,000 Units by mouth daily.   EPINEPHrine 0.3 mg/0.3 mL IJ SOAJ injection Inject 0.3 mLs (0.3 mg total) into the skin as needed.   ibuprofen (ADVIL) 600 MG tablet Take 1 tablet (600 mg total) by mouth every 8 (eight) hours as needed.   SYNTHROID 112 MCG tablet TAKE 1 TABLET(112 MCG) BY MOUTH DAILY   UNABLE TO FIND Med Name: Blood pressure cuff   [DISCONTINUED] SYNTHROID 112 MCG tablet TAKE 1 TABLET(112 MCG) BY MOUTH DAILY   No facility-administered encounter medications on file as of 03/11/2021.    ALLERGIES: Allergies  Allergen Reactions   Coconut (Cocos Nucifera) Allergy Skin Test Anaphylaxis   Coconut Oil Anaphylaxis   Other Shortness Of Breath    Covid booster pfizer     Prednisone Hypertension    Severe headache   Naproxen Other (See Comments)    headache   VACCINATION STATUS: Immunization History  Administered Date(s) Administered   Fluad Quad(high Dose 65+) 02/11/2020, 01/20/2021   Influenza, High Dose Seasonal PF 02/08/2018, 02/17/2019   Influenza,inj,Quad PF,6+ Mos 02/28/2017   Influenza-Unspecified 02/28/2017   PFIZER(Purple Top)SARS-COV-2 Vaccination 06/06/2019, 06/24/2019, 04/21/2020, 10/07/2020, 02/22/2021   Pneumococcal Conjugate-13 07/05/2016   Pneumococcal Polysaccharide-23 04/29/2019   Td 12/23/2018     HPI     Kara [name]  is a patient with the above medical history. she was diagnosed  with hyperthyroidism at approximate age of 33 years  which required I-131 thyroid ablation in environment.   -She was subsequently initiated on thyroid hormone supplement.  She is currently on Synthroid 112 mcg p.o. daily before breakfast.   She reports compliance to her medication. -she   complains of weight gain and fatigue.  -She describes steady weight gain, on and off hot flashes.  She did denies tremors, palpitations. Pt denies feeling nodules in neck, hoarseness, dysphagia/odynophagia, SOB with lying down.  she denies family history of  thyroid disorders.  No family history of thyroid cancer.  Her father had head and neck tumor which did not appear to be related to thyroid cancer.  ROS:  Limited as above.   Physical Exam: BP 108/72   Pulse 80  Ht '5\' 6"'$  (1.676 m)   Wt 238 lb (108 kg)   BMI 38.41 kg/m  Wt Readings from Last 3 Encounters:  03/11/21 238 lb (108 kg)  01/20/21 239 lb 0.6 oz (108.4 kg)  10/13/20 236 lb (107 kg)    CMP     Component Value Date/Time   NA 139 10/25/2020 0954   K 4.4 10/25/2020 0954   CL 104 10/25/2020 0954   CO2 20 10/25/2020 0954   GLUCOSE 108 (H) 10/25/2020 0954   GLUCOSE 107 (H) 03/08/2020 0935   BUN 16 10/25/2020 0954   CREATININE 0.79 10/25/2020 0954   CREATININE 0.75 03/08/2020 0935   CALCIUM 9.3 10/25/2020 0954   PROT 6.9 10/25/2020 0954   ALBUMIN 3.9 10/25/2020 0954   AST 23 10/25/2020 0954   ALT 22 10/25/2020 0954   ALKPHOS 133 (H) 10/25/2020 0954   BILITOT 0.3 10/25/2020 0954   GFRNONAA 70 10/22/2019 0958   GFRAA 82 10/22/2019 0958    Diabetic Labs (most recent): Lab Results  Component Value Date   HGBA1C 6.4 03/11/2021   HGBA1C 5.8 (A) 09/08/2019   HGBA1C 5.9 01/15/2019   HGBA1C 5.9 01/15/2019     Lipid Panel ( most recent) Lipid Panel     Component Value Date/Time   CHOL 193 10/25/2020 0954   TRIG 51 10/25/2020 0954   HDL 60  10/25/2020 0954   CHOLHDL 3.2 10/25/2020 0954   CHOLHDL 3.0 10/22/2019 0958   VLDL 15 11/24/2016 1018   LDLCALC 123 (H) 10/25/2020 0954   LDLCALC 114 (H) 10/22/2019 0958   LABVLDL 10 10/25/2020 0954     Lab Results  Component Value Date   TSH 0.62 03/02/2021   TSH 3.340 10/25/2020   TSH 1.48 03/08/2020   TSH 0.78 09/08/2019   TSH 0.34 (L) 02/27/2019   TSH 0.52 01/15/2019   TSH 0.58 08/16/2017   TSH 0.22 (L) 02/19/2017   TSH 0.28 (L) 12/15/2016   FREET4 1.5 03/02/2021   FREET4 1.56 10/25/2020   FREET4 1.4 03/08/2020   FREET4 1.3 09/08/2019   FREET4 1.5 02/27/2019       ASSESSMENT: 1. Hypothyroidism- RAI induced 2.  Prediabetes- worsening 3. Hyperlipidemia 4. Vitamin d deficiency   PLAN:   Her previsit thyroid function tests are consistent with appropriate replacement.  She is advised to continue Synthroid 112 mcg p.o. daily before breakfast.     - We discussed about the correct intake of her thyroid hormone, on empty stomach at fasting, with water, separated by at least 30 minutes from breakfast and other medications,  and separated by more than 4 hours from calcium, iron, multivitamins, acid reflux medications (PPIs). -Patient is made aware of the fact that thyroid hormone replacement is needed for life, dose to be adjusted by periodic monitoring of thyroid function tests.    -Due to absence of clinical goiter, no need for thyroid ultrasound. -Prediabetes: Her POC is 6.4% today increasing from 5.8%, she is not ready to take medications for it. - she acknowledges that there is a room for improvement in her food and drink choices. - Suggestion is made for her to avoid simple carbohydrates  from her diet including Cakes, Sweet Desserts, Ice Cream, Soda (diet and regular), Sweet Tea, Candies, Chips, Cookies, Store Bought Juices, Alcohol in Excess of  1-2 drinks a day, Artificial Sweeteners,  Coffee Creamer, and "Sugar-free" Products, Lemonade. This will help patient to  have more stable blood glucose profile and potentially avoid unintended weight gain.  -  I had a long discussion on [employer] Lifestyle nutrition and patient reading materials to the patient. This approach will help her with weight management, prediabetes, HPL, HTN. - she is advised to increase her vitamin D to 2000 units qday.  will have repeat A1c during her next visit.   She is advised to maintain close follow-up with her PCP.    I spent 35  minutes in the care of the patient today including review of labs from Thyroid Function, CMP, and other relevant labs ; imaging/biopsy records (current and previous including abstractions from other facilities); face-to-face time discussing  her lab results and symptoms, medications doses, her options of short and long term treatment based on the latest standards of care / guidelines;   and documenting the encounter.  [name]  participated in the discussions, expressed understanding, and voiced agreement with the above plans.  All questions were answered to her satisfaction. she is encouraged to contact clinic should she have any questions or concerns prior to her return visit.    Return in about 4 months (around 07/12/2021) for F/U with Pre-visit Labs, A1c -NV.  Glade Lloyd, MD [facility] [address] [place], [place] [address] Phone: [phone]  Fax: [phone]   03/11/2021, 4:13 PM  This note was partially dictated with voice recognition software. Similar sounding words can be transcribed inadequately or may not  be corrected upon review.

## 2021-03-11 NOTE — Patient Instructions (Signed)

## 2021-03-14 DIAGNOSIS — M722 Plantar fascial fibromatosis: Secondary | ICD-10-CM | POA: Diagnosis not present

## 2021-03-14 DIAGNOSIS — M79671 Pain in right foot: Secondary | ICD-10-CM | POA: Diagnosis not present

## 2021-03-14 DIAGNOSIS — R269 Unspecified abnormalities of gait and mobility: Secondary | ICD-10-CM | POA: Diagnosis not present

## 2021-03-14 DIAGNOSIS — M25671 Stiffness of right ankle, not elsewhere classified: Secondary | ICD-10-CM | POA: Diagnosis not present

## 2021-03-16 DIAGNOSIS — M722 Plantar fascial fibromatosis: Secondary | ICD-10-CM | POA: Diagnosis not present

## 2021-03-16 DIAGNOSIS — M79671 Pain in right foot: Secondary | ICD-10-CM | POA: Diagnosis not present

## 2021-03-16 DIAGNOSIS — R269 Unspecified abnormalities of gait and mobility: Secondary | ICD-10-CM | POA: Diagnosis not present

## 2021-03-16 DIAGNOSIS — M25671 Stiffness of right ankle, not elsewhere classified: Secondary | ICD-10-CM | POA: Diagnosis not present

## 2021-03-21 DIAGNOSIS — M79671 Pain in right foot: Secondary | ICD-10-CM | POA: Diagnosis not present

## 2021-03-21 DIAGNOSIS — R269 Unspecified abnormalities of gait and mobility: Secondary | ICD-10-CM | POA: Diagnosis not present

## 2021-03-21 DIAGNOSIS — M25671 Stiffness of right ankle, not elsewhere classified: Secondary | ICD-10-CM | POA: Diagnosis not present

## 2021-03-21 DIAGNOSIS — M722 Plantar fascial fibromatosis: Secondary | ICD-10-CM | POA: Diagnosis not present

## 2021-03-22 DIAGNOSIS — D0512 Intraductal carcinoma in situ of left breast: Secondary | ICD-10-CM | POA: Diagnosis not present

## 2021-03-24 DIAGNOSIS — M25671 Stiffness of right ankle, not elsewhere classified: Secondary | ICD-10-CM | POA: Diagnosis not present

## 2021-03-24 DIAGNOSIS — M722 Plantar fascial fibromatosis: Secondary | ICD-10-CM | POA: Diagnosis not present

## 2021-03-24 DIAGNOSIS — M79671 Pain in right foot: Secondary | ICD-10-CM | POA: Diagnosis not present

## 2021-03-24 DIAGNOSIS — R269 Unspecified abnormalities of gait and mobility: Secondary | ICD-10-CM | POA: Diagnosis not present

## 2021-04-04 ENCOUNTER — Encounter: Payer: Self-pay | Admitting: Internal Medicine

## 2021-04-04 ENCOUNTER — Other Ambulatory Visit: Payer: Self-pay

## 2021-04-04 ENCOUNTER — Ambulatory Visit (INDEPENDENT_AMBULATORY_CARE_PROVIDER_SITE_OTHER): Payer: Medicare Other | Admitting: Internal Medicine

## 2021-04-04 DIAGNOSIS — U071 COVID-19: Secondary | ICD-10-CM | POA: Diagnosis not present

## 2021-04-04 MED ORDER — NIRMATRELVIR/RITONAVIR (PAXLOVID)TABLET: 3.0000 | ORAL_TABLET | Freq: Two times a day (BID) | ORAL | 0 refills | Status: AC

## 2021-04-04 NOTE — Progress Notes (Signed)
Virtual Visit via Telephone Note   This visit type was conducted due to national recommendations for restrictions regarding the COVID-19 Pandemic (e.g. social distancing) in an effort to limit this patient's exposure and mitigate transmission in our community.  Due to her co-morbid illnesses, this patient is at least at moderate risk for complications without adequate follow up.  This format is felt to be most appropriate for this patient at this time.  The patient did not have access to video technology/had technical difficulties with video requiring transitioning to audio format only (telephone).  All issues noted in this document were discussed and addressed.  No physical exam could be performed with this format.  Evaluation Performed:  Follow-up visit  Date:  04/04/2021   ID:  Kara [name], DOB 1946-04-17, MRN [id number]  Patient Location: Home Provider Location: Office/Clinic  Participants: Patient Location of Patient: Home Location of Provider: Telehealth Consent was obtain for visit to be over via telehealth. I verified that I am speaking with the correct person using two identifiers.  PCP:  Lindell Spar, MD   Chief Complaint: Fever, chills and headache  History of Present Illness:    Kara [name] is a 75 y.o. female who has a televisit for complaint of fever, chills, headache and chest congestion for last 2 days.  She tested positive for COVID at home.  She went to [facility] to visit her family member, who also tested positive for COVID.  She currently denies any dyspnea or wheezing.  She is up-to-date with COVID-vaccine.  The patient does have symptoms concerning for COVID-19 infection (fever, chills, cough, or new shortness of breath).   Past Medical, Surgical, Social History, Allergies, and Medications have been Reviewed.  Past Medical History:  Diagnosis Date   Allergy    food allergies, drug allergies   Anemia    Anxiety    Arthritis    Breast  cancer ([place]) 05/2019   left breast DCIS   Breast pain, left 02/25/2018   Cataract    Change in bowel function 04/09/2018   Chronic pain of left knee 09/03/2019   Colon polyps 08/23/2016   COVID-19 virus infection 01/14/2020   Dyspepsia 09/29/2016   Family history of breast cancer    Family history of cancer 08/23/2016   Aunt is Ursula Beath   Family history of prostate cancer    Genetic testing 08/11/2019   Negative genetic testing on the [facility] 9-gene STAT panel.  The STAT Breast cancer panel offered by [facility] includes sequencing and rearrangement analysis for the following 9 genes:  ATM, BRCA1, BRCA2, CDH1, CHEK2, PALB2, PTEN, STK11 and TP53.   The report date is August 09, 2019.   GERD (gastroesophageal reflux disease)    H/O swallowed foreign body 11/20/2018   Headache in back of head 12/31/2018   Hip pain, acute, left 03/27/2019   HLD (hyperlipidemia) 08/23/2016   Hyperlipidemia    Hypothyroid 08/14/2016   Insomnia due to stress 12/23/2018   Malignant neoplasm of upper-outer quadrant of left breast in female, estrogen receptor negative ([place]) 06/11/2019   DCIS left breast   Perimenopausal vasomotor symptoms 02/11/2016   Personal history of radiation therapy 2021   completed left breast radiation in April 2021   Postmenopausal atrophic vaginitis 11/09/2015   Pre-diabetes    no meds   Prediabetes 08/30/2017   Thyroid disease    Grave's   Vaginal discharge 01/16/2019   Weight loss, unintentional 03/27/2019   Past Surgical History:  Procedure Laterality Date   ABDOMINAL HYSTERECTOMY     bleeding. fibroids   BREAST BIOPSY Left 05/2019   left breast DCIS   BREAST LUMPECTOMY Left 06/2019   DCIS   BREAST LUMPECTOMY WITH RADIOACTIVE SEED LOCALIZATION Left 07/08/2019   Procedure: LEFT BREAST LUMPECTOMY WITH RADIOACTIVE SEED LOCALIZATION;  Surgeon: Jovita Kussmaul, MD;  Location: [facility];  Service: General;  Laterality: Left;   BREAST SURGERY N/A    Phreesia 08/30/2020    CESAREAN SECTION     CESAREAN SECTION N/A    Phreesia 08/30/2020   CHOLECYSTECTOMY     COLONOSCOPY  2011   [place]: two 3-4 mm sessile polyps, hyperplastic, sigmoid diverticula, TI appeared normal.    ESOPHAGOGASTRODUODENOSCOPY  2011   [place]: non-bleeding erosive gastropathy, normal duodenum. path: unremarkable duodenum, negative H.pylori, minimal esophagitis    TONSILLECTOMY  1975     Current Meds  Medication Sig   acetaminophen (TYLENOL) 500 MG tablet Take 500 mg by mouth every 6 (six) hours as needed.   albuterol (VENTOLIN HFA) 108 (90 Base) MCG/ACT inhaler Inhale 1-2 puffs into the lungs every 6 (six) hours as needed for wheezing or shortness of breath.   Ascorbic Acid (VITAMIN C WITH ROSE HIPS) 500 MG tablet Take 500 mg by mouth daily.   cholecalciferol (VITAMIN D3) 25 MCG (1000 UNIT) tablet Take 2,000 Units by mouth daily.   EPINEPHrine 0.3 mg/0.3 mL IJ SOAJ injection Inject 0.3 mLs (0.3 mg total) into the skin as needed.   ibuprofen (ADVIL) 600 MG tablet Take 1 tablet (600 mg total) by mouth every 8 (eight) hours as needed.   SYNTHROID 112 MCG tablet TAKE 1 TABLET(112 MCG) BY MOUTH DAILY   TURMERIC CURCUMIN PO Take 1 tablet by mouth 2 (two) times daily.   UNABLE TO FIND Med Name: Blood pressure cuff     Allergies:   Coconut (cocos nucifera) allergy skin test, Coconut oil, Other, Prednisone, and Naproxen   ROS:   Please see the history of present illness.     All other systems reviewed and are negative.   Labs/Other Tests and Data Reviewed:    Recent Labs: 10/25/2020: ALT 22; BUN 16; Creatinine, Ser 0.79; Hemoglobin 13.5; Platelets 203; Potassium 4.4; Sodium 139 03/02/2021: TSH 0.62   Recent Lipid Panel Lab Results  Component Value Date/Time   CHOL 193 10/25/2020 09:54 AM   TRIG 51 10/25/2020 09:54 AM   HDL 60 10/25/2020 09:54 AM   CHOLHDL 3.2 10/25/2020 09:54 AM   CHOLHDL 3.0 10/22/2019 09:58 AM   LDLCALC 123 (H) 10/25/2020 09:54 AM   LDLCALC 114 (H)  10/22/2019 09:58 AM    Wt Readings from Last 3 Encounters:  03/11/21 238 lb (108 kg)  01/20/21 239 lb 0.6 oz (108.4 kg)  10/13/20 236 lb (107 kg)     ASSESSMENT & PLAN:    COVID 19 infection Started Paxlovid Mucinex or Robitussin as needed for cough Tylenol as needed for fever or chills or myalgias Advised to self quarantine for at least 5 days or until afebrile for at least 24 hours, whichever is later.  Time:   Today, I have spent 9 minutes reviewing the chart, including problem list, medications, and with the patient with telehealth technology discussing the above problems.   Medication Adjustments/Labs and Tests Ordered: Current medicines are reviewed at length with the patient today.  Concerns regarding medicines are outlined above.   Tests Ordered: No orders of the defined types were placed in this encounter.  Medication Changes: No orders of the defined types were placed in this encounter.    Note: This dictation was prepared with Dragon dictation along with smaller phrase technology. Similar sounding words can be transcribed inadequately or may not be corrected upon review. Any transcriptional errors that result from this process are unintentional.      Disposition:  Follow up  Signed, Lindell Spar, MD  04/04/2021 12:24 PM     [facility]

## 2021-04-06 ENCOUNTER — Encounter: Payer: Self-pay | Admitting: Internal Medicine

## 2021-04-14 ENCOUNTER — Other Ambulatory Visit: Payer: Self-pay | Admitting: Adult Health

## 2021-04-14 ENCOUNTER — Other Ambulatory Visit: Payer: Self-pay | Admitting: General Surgery

## 2021-04-14 DIAGNOSIS — Z9889 Other specified postprocedural states: Secondary | ICD-10-CM

## 2021-04-20 ENCOUNTER — Encounter: Payer: Self-pay | Admitting: Internal Medicine

## 2021-04-20 NOTE — Telephone Encounter (Signed)
Virtual appt scheduled

## 2021-04-21 ENCOUNTER — Encounter: Payer: Self-pay | Admitting: Nurse Practitioner

## 2021-04-21 ENCOUNTER — Other Ambulatory Visit: Payer: Self-pay

## 2021-04-21 ENCOUNTER — Ambulatory Visit (INDEPENDENT_AMBULATORY_CARE_PROVIDER_SITE_OTHER): Payer: Medicare Other | Admitting: Nurse Practitioner

## 2021-04-21 DIAGNOSIS — U099 Post covid-19 condition, unspecified: Secondary | ICD-10-CM | POA: Diagnosis not present

## 2021-04-21 MED ORDER — PROMETHAZINE-DM 6.25-15 MG/5ML PO SYRP: 5.0000 mL | ORAL_SOLUTION | Freq: Four times a day (QID) | ORAL | 0 refills | Status: DC | PRN

## 2021-04-21 MED ORDER — AZELASTINE HCL 0.1 % NA SOLN: 2.0000 | Freq: Two times a day (BID) | NASAL | 0 refills | Status: DC

## 2021-04-21 NOTE — Assessment & Plan Note (Signed)
Promethazine DM for cough Azelastine nasal spray for nasal congestion Albuterol inhaler as needed for SOB Tylenol as needed for fever.

## 2021-04-21 NOTE — Progress Notes (Addendum)
Virtual Visit via Telephone Note  I connected with Kara [name] on 04/21/21 at 0822 by telephone and verified that I am speaking with the correct person using two identifiers. I spent a total of 10 minutes reviewing charts and discussing with pt.   Location: Patient: home Provider: office   I discussed the limitations, risks, security and privacy concerns of performing an evaluation and management service by telephone and the availability of in person appointments. I also discussed with the patient that there may be a patient responsible charge related to this service. The patient expressed understanding and agreed to proceed.   History of Present Illness: Pt stated that she had covid on 04/05/21 used paxlovid and mucinex. She is still having head congestion, coughing (clear mucus) , some other times greenish ,a lot post nasal drip, nasal bleed SOB.  Pt denies fever, aches, bloody sputum, CP ,wheezing.    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   Cough; promethazine DM as needed  Nasal congestion: azelastine nasal spray   SOB : Albuterol PRN Follow Up Instructions:   I discussed the assessment and treatment plan with the patient. The patient was provided an opportunity to ask questions and all were answered. The patient agreed with the plan and demonstrated an understanding of the instructions.   The patient was advised to call back or seek an in-person evaluation if the symptoms worsen or if the condition fails to improve as anticipated.

## 2021-05-17 ENCOUNTER — Encounter: Payer: Self-pay | Admitting: Internal Medicine

## 2021-05-18 ENCOUNTER — Other Ambulatory Visit: Payer: Self-pay | Admitting: "Endocrinology

## 2021-05-23 ENCOUNTER — Other Ambulatory Visit: Payer: Self-pay

## 2021-05-23 ENCOUNTER — Encounter: Payer: Self-pay | Admitting: Internal Medicine

## 2021-05-23 ENCOUNTER — Ambulatory Visit (INDEPENDENT_AMBULATORY_CARE_PROVIDER_SITE_OTHER): Payer: Medicare Other | Admitting: Internal Medicine

## 2021-05-23 VITALS — BP 116/72 | HR 80 | Resp 18 | Ht 66.0 in | Wt 237.0 lb

## 2021-05-23 DIAGNOSIS — E559 Vitamin D deficiency, unspecified: Secondary | ICD-10-CM | POA: Diagnosis not present

## 2021-05-23 DIAGNOSIS — M7989 Other specified soft tissue disorders: Secondary | ICD-10-CM

## 2021-05-23 DIAGNOSIS — R7303 Prediabetes: Secondary | ICD-10-CM | POA: Diagnosis not present

## 2021-05-23 DIAGNOSIS — E890 Postprocedural hypothyroidism: Secondary | ICD-10-CM | POA: Diagnosis not present

## 2021-05-23 NOTE — Patient Instructions (Signed)
Please continue to take medications as prescribed.  Please continue to follow low salt diet and ambulate as tolerated.  You are being referred to Physical therapy for leg swelling.

## 2021-05-23 NOTE — Progress Notes (Signed)
Established Patient Office Visit  Subjective:  Patient ID: Kara [name], female    DOB: [birth year]  Age: 76 y.o. MRN: [id number]  CC:  Chief Complaint  Patient presents with   Follow-up    4 month follow up has swelling in feet and legs this has been going on for awhile would like to lose weight this year     HPI Kara [name] is a 76 y.o. female with past medical history of GERD and hypothyroidism who presents for f/u of her chronic medical conditions.  She continues to have b/l LE swelling.  She has tried using compression stockings, which have helped her somewhat.  She did not get much relief with HCTZ.  She did not tolerate Lasix.  She is trying to lose weight as well.  She denies any dyspnea, orthopnea, PND.  She denies any chest pain or palpitations.  She has been taking Synthroid regularly for her hypothyroidism.  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  The STAT Breast cancer panel offered by [facility] includes sequencing and rearrangement analysis for the following 9 genes:  ATM, BRCA1, BRCA2, CDH1, CHEK2, PALB2, PTEN, STK11 and TP53.   The report date is August 09, 2019.   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 fibroids   BREAST BIOPSY Left 05/2019   left breast DCIS   BREAST LUMPECTOMY Left 06/2019   DCIS   BREAST LUMPECTOMY WITH RADIOACTIVE SEED LOCALIZATION Left 07/08/2019   Procedure: LEFT BREAST LUMPECTOMY WITH RADIOACTIVE SEED LOCALIZATION;  Surgeon: Griselda Miner, MD;  Location: [facility];  Service: General;  Laterality: Left;   BREAST SURGERY N/A    Phreesia 08/30/2020   CESAREAN SECTION     CESAREAN SECTION N/A    Phreesia 08/30/2020   CHOLECYSTECTOMY     COLONOSCOPY  2011   [place]: two 3-4 mm sessile polyps, hyperplastic, sigmoid diverticula, TI appeared normal.    ESOPHAGOGASTRODUODENOSCOPY  2011   [place]: non-bleeding erosive gastropathy, normal duodenum. path:  unremarkable duodenum, negative H.pylori, minimal esophagitis    TONSILLECTOMY  1975    Family History  Problem Relation Age of Onset   Breast cancer Maternal Grandmother        dx over 48   Prostate cancer Maternal Grandfather    Arthritis Mother    COPD Mother    Breast cancer Mother 68       second at age 81   Cancer Father        throat   Stroke Father    Cancer Maternal Aunt 91       [name], cervical cancer   Breast cancer Maternal Aunt        dx over 35   Breast cancer Paternal Aunt        dx under 56   Prostate cancer Maternal Uncle        dx over 87   Cancer Other        father's maternal cousin was  Henreietta Lacks, Cervical   Cancer Maternal Uncle        unknown cancer   Breast cancer Paternal Aunt        dx over 64   Colon cancer Neg Hx    Allergic rhinitis Neg Hx    Angioedema Neg Hx    Asthma Neg Hx    Atopy Neg Hx    Eczema Neg Hx    Urticaria Neg Hx    Immunodeficiency Neg Hx     Social History   Socioeconomic History   Marital status: Divorced    Spouse name: Not on file   Number of children: 2   Years of education: 15   Highest education level: Not on file  Occupational History   Occupation: retired    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  Tobacco Use   Smoking status: Former    Types: Cigarettes    Quit date: 06/15/1998    Years since quitting: 22.9   Smokeless tobacco: Never   Tobacco comments:    quit 2002  Vaping Use   Vaping Use: Never used  Substance and Sexual Activity   Alcohol use: No   Drug use: No   Sexual activity: Not Currently    Birth control/protection: Surgical    Comment: hyst  Other Topics Concern   Not on file  Social History Narrative   Lives alone   2 grown children- one in [place] and one here   Grandchildren       Moved to [place] from [place] for aging mother who is 51 with Alzheimers      Maternal Aunt is Stage manager of the [employer] cancer call line      Enjoy: gym, time with friends       Diet: eats all food groups   Caffeine: 2 diet cokes daily    Water: 2-4 cups daily       Wears seat belt    Does not use phone while driving    Smoke and carbon Careers adviser at home   Data processing manager    No weapons    Social Determinants of Health   Financial Resource Strain: Low Risk    Difficulty of Paying Living Expenses: Not hard at all  Food Insecurity: No Food Insecurity   Worried About Charity fundraiser in the Last Year: Never true   Arboriculturist in the Last Year: Never true  Transportation Needs: No Transportation Needs   Lack  of Transportation (Medical): No   Lack of Transportation  (Non-Medical): No  Physical Activity: Sufficiently Active   Days of Exercise per Week: 7 days   Minutes of Exercise per Session: 30 min  Stress: No Stress Concern Present   Feeling of Stress : Not at all  Social Connections: Moderately Isolated   Frequency of Communication with Friends and Family: More than three times a week   Frequency of Social Gatherings with Friends and Family: More than three times a week   Attends Religious Services: Never   Marine scientist or Organizations: Yes   Attends Music therapist: More than 4 times per year   Marital Status: Divorced  Human resources officer Violence: Not on file    Outpatient Medications Prior to Visit  Medication Sig Dispense Refill   acetaminophen (TYLENOL) 500 MG tablet Take 500 mg by mouth every 6 (six) hours as needed.     albuterol (VENTOLIN HFA) 108 (90 Base) MCG/ACT inhaler Inhale 1-2 puffs into the lungs every 6 (six) hours as needed for wheezing or shortness of breath. 8 g 1   Ascorbic Acid (VITAMIN C WITH ROSE HIPS) 500 MG tablet Take 500 mg by mouth daily.     azelastine (ASTELIN) 0.1 % nasal spray Place 2 sprays into both nostrils 2 (two) times daily. Use in each nostril as directed 30 mL 0   calcium carbonate (CALCIUM 600) 600 MG TABS tablet Take 600 mg by mouth 2 (two) times daily with a meal.     cholecalciferol (VITAMIN D3) 25 MCG (1000 UNIT) tablet Take 2,000 Units by mouth daily.     EPINEPHrine 0.3 mg/0.3 mL IJ SOAJ injection Inject 0.3 mLs (0.3 mg total) into the skin as needed. 2 each 2   ibuprofen (ADVIL) 600 MG tablet Take 1 tablet (600 mg total) by mouth every 8 (eight) hours as needed. 30 tablet 0   promethazine-dextromethorphan (PROMETHAZINE-DM) 6.25-15 MG/5ML syrup Take 5 mLs by mouth 4 (four) times daily as needed for cough. 180 mL 0   SYNTHROID 112 MCG tablet TAKE 1 TABLET(112 MCG) BY MOUTH DAILY 90 tablet 1   TURMERIC CURCUMIN PO Take 1 tablet by mouth 2 (two) times daily.     UNABLE TO FIND  Med Name: Blood pressure cuff 1 Product 0   No facility-administered medications prior to visit.    Allergies  Allergen Reactions   Coconut (Cocos Nucifera) Allergy Skin Test Anaphylaxis   Coconut Oil Anaphylaxis   Other Shortness Of Breath    Covid booster pfizer     Prednisone Hypertension    Severe headache   Naproxen Other (See Comments)    headache    ROS Review of Systems  Constitutional:  Negative for chills and fever.  HENT:  Negative for congestion, sinus pressure, sinus pain and sore throat.   Eyes:  Negative for pain and discharge.  Respiratory:  Negative for cough and shortness of breath.   Cardiovascular:  Positive for leg swelling. Negative for chest pain and palpitations.  Gastrointestinal:  Negative for abdominal pain, constipation, diarrhea, nausea and vomiting.  Endocrine: Negative for polydipsia and polyuria.  Genitourinary:  Negative for dysuria and hematuria.  Musculoskeletal:  Positive for arthralgias. Negative for neck pain and neck stiffness.  Skin:  Negative for rash.  Neurological:  Negative for dizziness and weakness.  Psychiatric/Behavioral:  Negative for agitation and behavioral problems.      Objective:    Physical Exam Vitals reviewed.  Constitutional:  General: She is not in acute distress.    Appearance: She is obese. She is not diaphoretic.  HENT:     Head: Normocephalic and atraumatic.     Nose: Nose normal. No congestion.     Mouth/Throat:     Mouth: Mucous membranes are moist.     Pharynx: No posterior oropharyngeal erythema.  Eyes:     General: No scleral icterus.    Extraocular Movements: Extraocular movements intact.  Cardiovascular:     Rate and Rhythm: Normal rate and regular rhythm.     Pulses: Normal pulses.     Heart sounds: Normal heart sounds. No murmur heard. Pulmonary:     Breath sounds: Normal breath sounds. No wheezing or rales.  Musculoskeletal:     Cervical back: Neck supple. No tenderness.     Right  lower leg: Edema (1+) present.     Left lower leg: Edema (1+) present.  Skin:    General: Skin is warm.     Findings: No rash.  Neurological:     General: No focal deficit present.     Mental Status: She is alert and oriented to person, place, and time.     Sensory: No sensory deficit.     Motor: No weakness.  Psychiatric:        Mood and Affect: Mood normal.        Behavior: Behavior normal.    BP 116/72 (BP Location: Right Arm, Patient Position: Sitting, Cuff Size: Normal)    Pulse 80    Resp 18    Ht $R'5\' 6"'iU$  (1.676 m)    Wt 237 lb (107.5 kg)    SpO2 99%    BMI 38.25 kg/m  Wt Readings from Last 3 Encounters:  05/23/21 237 lb (107.5 kg)  03/11/21 238 lb (108 kg)  01/20/21 239 lb 0.6 oz (108.4 kg)    Lab Results  Component Value Date   TSH 0.62 03/02/2021   Lab Results  Component Value Date   WBC 6.8 10/25/2020   HGB 13.5 10/25/2020   HCT 42.3 10/25/2020   MCV 82 10/25/2020   PLT 203 10/25/2020   Lab Results  Component Value Date   NA 142 05/23/2021   K 4.9 05/23/2021   CO2 24 05/23/2021   GLUCOSE 109 (H) 05/23/2021   BUN 15 05/23/2021   CREATININE 0.83 05/23/2021   BILITOT 0.3 10/25/2020   ALKPHOS 133 (H) 10/25/2020   AST 23 10/25/2020   ALT 22 10/25/2020   PROT 6.9 10/25/2020   ALBUMIN 3.9 10/25/2020   CALCIUM 10.3 05/23/2021   ANIONGAP 7 01/19/2019   EGFR 74 05/23/2021   Lab Results  Component Value Date   CHOL 193 10/25/2020   Lab Results  Component Value Date   HDL 60 10/25/2020   Lab Results  Component Value Date   LDLCALC 123 (H) 10/25/2020   Lab Results  Component Value Date   TRIG 51 10/25/2020   Lab Results  Component Value Date   CHOLHDL 3.2 10/25/2020   Lab Results  Component Value Date   HGBA1C 6.4 03/11/2021      Assessment & Plan:   Problem List Items Addressed This Visit       Endocrine   Hypothyroidism following radioiodine therapy    Lab Results  Component Value Date   TSH 0.62 03/02/2021  On Levothyroxine, f/u  with Dr Dorris Fetch      Relevant Orders   Basic Metabolic Panel (BMET) (Completed)     Other  Prediabetes - Primary    Lab Results  Component Value Date   HGBA1C 6.4 03/11/2021  Advised to follow low carb diet for now      Relevant Orders   Microalbumin, urine (Completed)   Basic Metabolic Panel (BMET) (Completed)   Vitamin D deficiency    Last vitamin D Lab Results  Component Value Date   VD25OH 64.5 05/23/2021  Takes Vitamin D supplements      Relevant Orders   Vitamin D (25 hydroxy) (Completed)   Leg swelling    Likely related to venous insufficiency Leg elevation and compression socks Did not tolerate diuretics in the past Referred to PT      Relevant Orders   Ambulatory referral to Physical Therapy   Basic Metabolic Panel (BMET) (Completed)    No orders of the defined types were placed in this encounter.   Follow-up: Return in about 6 months (around 11/20/2021).    Lindell Spar, MD

## 2021-05-24 LAB — BASIC METABOLIC PANEL
BUN/Creatinine Ratio: 18 (ref 12–28)
BUN: 15 mg/dL (ref 8–27)
CO2: 24 mmol/L (ref 20–29)
Calcium: 10.3 mg/dL (ref 8.7–10.3)
Chloride: 104 mmol/L (ref 96–106)
Creatinine, Ser: 0.83 mg/dL (ref 0.57–1.00)
Glucose: 109 mg/dL — ABNORMAL HIGH (ref 70–99)
Potassium: 4.9 mmol/L (ref 3.5–5.2)
Sodium: 142 mmol/L (ref 134–144)
eGFR: 74 mL/min/{1.73_m2} (ref >59)

## 2021-05-24 LAB — VITAMIN D 25 HYDROXY (VIT D DEFICIENCY, FRACTURES): Vit D, 25-Hydroxy: 64.5 ng/mL (ref 30.0–100.0)

## 2021-05-25 ENCOUNTER — Telehealth: Payer: Self-pay | Admitting: Internal Medicine

## 2021-05-25 DIAGNOSIS — M7989 Other specified soft tissue disorders: Secondary | ICD-10-CM | POA: Insufficient documentation

## 2021-05-25 LAB — MICROALBUMIN, URINE: Microalbumin, Urine: 7.7 ug/mL

## 2021-05-25 NOTE — Telephone Encounter (Signed)
Pt advised of lab results with verbal understanding  

## 2021-05-25 NOTE — Assessment & Plan Note (Signed)
Last vitamin D Lab Results  Component Value Date   VD25OH 64.5 05/23/2021   Takes Vitamin D supplements

## 2021-05-25 NOTE — Assessment & Plan Note (Signed)
Likely related to venous insufficiency Leg elevation and compression socks Did not tolerate diuretics in the past Referred to PT

## 2021-05-25 NOTE — Telephone Encounter (Signed)
Pt return call °

## 2021-05-25 NOTE — Assessment & Plan Note (Signed)
Lab Results  Component Value Date   HGBA1C 6.4 03/11/2021   Advised to follow low carb diet for now 

## 2021-05-25 NOTE — Assessment & Plan Note (Signed)
Lab Results  Component Value Date   TSH 0.62 03/02/2021   On Levothyroxine, f/u with Dr Dorris Fetch

## 2021-05-27 ENCOUNTER — Ambulatory Visit
Admission: RE | Admit: 2021-05-27 | Discharge: 2021-05-27 | Disposition: A | Discharge status: Home / Self Care | Payer: Medicare Other | Source: Ambulatory Visit | Attending: General Surgery | Admitting: General Surgery

## 2021-05-27 DIAGNOSIS — Z853 Personal history of malignant neoplasm of breast: Secondary | ICD-10-CM | POA: Diagnosis not present

## 2021-05-27 DIAGNOSIS — R922 Inconclusive mammogram: Secondary | ICD-10-CM | POA: Diagnosis not present

## 2021-05-27 DIAGNOSIS — Z9889 Other specified postprocedural states: Secondary | ICD-10-CM

## 2021-06-07 ENCOUNTER — Encounter: Payer: Self-pay | Admitting: Internal Medicine

## 2021-06-09 DIAGNOSIS — H01111 Allergic dermatitis of right upper eyelid: Secondary | ICD-10-CM | POA: Diagnosis not present

## 2021-06-13 NOTE — Progress Notes (Signed)
Patient Care Team: Anabel Halon, MD as PCP - General (Internal Medicine) Jena Gauss Gerrit Friends, MD as Consulting Physician (Gastroenterology) Griselda Miner, MD as Consulting Physician (General Surgery) Serena Croissant, MD as Consulting Physician (Hematology and Oncology) Antony Blackbird, MD as Consulting Physician (Radiation Oncology)  DIAGNOSIS:    ICD-10-CM   1. Ductal carcinoma in situ (DCIS) of left breast  D05.12       SUMMARY OF ONCOLOGIC HISTORY: Oncology History  Ductal carcinoma in situ (DCIS) of left breast  06/04/2019 Cancer Staging   Staging form: Breast, AJCC 8th Edition - Clinical stage from 06/04/2019: Stage 0 (cTis (DCIS), cN0, cM0, ER-, PR-)   06/11/2019 Initial Diagnosis   Screening mammogram showed left breast calcifications. Diagnostic mammogram showed left breast calcifications spanning 1.1cm. Biopsy showed DCIS with apocrine features, calcifications, and necrosis, high grade, ER/PR negative.   07/08/2019 Surgery   Left lumpectomy [name]) [phone]): high grade DCIS, 1.5cm, clear margins.   07/08/2019 Cancer Staging   Staging form: Breast, AJCC 8th Edition - Pathologic stage from 07/08/2019: Stage 0 (pTis (DCIS), pN0, cM0, ER-, PR-)   08/09/2019 Genetic Testing   Negative genetic testing on the [facility] 9-gene STAT panel.  The STAT Breast cancer panel offered by [facility] includes sequencing and rearrangement analysis for the following 9 genes:  ATM, BRCA1, BRCA2, CDH1, CHEK2, PALB2, PTEN, STK11 and TP53.   The report date is August 09, 2019.   08/11/2019 - 09/01/2019 Radiation Therapy   The patient initially received a dose of 42.72 Gy in 16 fractions to the breast using whole-breast tangent fields. This was delivered using a 3-D conformal technique. No boost. The total dose was 42.72 Gy.     CHIEF COMPLIANT: Follow-up of left breast DCIS  INTERVAL HISTORY: Kara [name] is a 76 y.o. with above-mentioned history of left breast DCIS who underwent a left  lumpectomy, radiation, and is currently on surveillance. Mammogram on 05/27/2021 showed no evidence of malignancy. She presents to the clinic today for follow-up.  She continues to have intermittent pain in the surgical scars.  ALLERGIES:  is allergic to coconut (cocos nucifera) allergy skin test, coconut oil, other, prednisone, and naproxen.  MEDICATIONS:  Current Outpatient Medications  Medication Sig Dispense Refill   acetaminophen (TYLENOL) 500 MG tablet Take 500 mg by mouth every 6 (six) hours as needed.     albuterol (VENTOLIN HFA) 108 (90 Base) MCG/ACT inhaler Inhale 1-2 puffs into the lungs every 6 (six) hours as needed for wheezing or shortness of breath. 8 g 1   Ascorbic Acid (VITAMIN C WITH ROSE HIPS) 500 MG tablet Take 500 mg by mouth daily.     azelastine (ASTELIN) 0.1 % nasal spray Place 2 sprays into both nostrils 2 (two) times daily. Use in each nostril as directed 30 mL 0   calcium carbonate (CALCIUM 600) 600 MG TABS tablet Take 600 mg by mouth 2 (two) times daily with a meal.     cholecalciferol (VITAMIN D3) 25 MCG (1000 UNIT) tablet Take 2,000 Units by mouth daily.     EPINEPHrine 0.3 mg/0.3 mL IJ SOAJ injection Inject 0.3 mLs (0.3 mg total) into the skin as needed. 2 each 2   ibuprofen (ADVIL) 600 MG tablet Take 1 tablet (600 mg total) by mouth every 8 (eight) hours as needed. 30 tablet 0   promethazine-dextromethorphan (PROMETHAZINE-DM) 6.25-15 MG/5ML syrup Take 5 mLs by mouth 4 (four) times daily as needed for cough. 180 mL 0   SYNTHROID 112 MCG tablet  TAKE 1 TABLET(112 MCG) BY MOUTH DAILY 90 tablet 1   TURMERIC CURCUMIN PO Take 1 tablet by mouth 2 (two) times daily.     No current facility-administered medications for this visit.    PHYSICAL EXAMINATION: ECOG PERFORMANCE STATUS: 1 - Symptomatic but completely ambulatory  Vitals:   06/14/21 1054 06/14/21 1055  BP: (!) 124/48 135/87  Pulse: 80   Resp: 18   Temp: (!) 97.2 F (36.2 C)   SpO2: 100%    Filed Weights    06/14/21 1054  Weight: 239 lb 4.8 oz (108.5 kg)      LABORATORY DATA:  I have reviewed the data as listed CMP Latest Ref Rng & Units 05/23/2021 10/25/2020 03/08/2020  Glucose 70 - 99 mg/dL 109(H) 108(H) 107(H)  BUN 8 - 27 mg/dL $Remove'15 16 19  'wYIylia$ Creatinine 0.57 - 1.00 mg/dL 0.83 0.79 0.75  Sodium 134 - 144 mmol/L 142 139 139  Potassium 3.5 - 5.2 mmol/L 4.9 4.4 4.3  Chloride 96 - 106 mmol/L 104 104 109  CO2 20 - 29 mmol/L $RemoveB'24 20 25  'gSvMFXrF$ Calcium 8.7 - 10.3 mg/dL 10.3 9.3 9.3  Total Protein 6.0 - 8.5 g/dL - 6.9 6.8  Total Bilirubin 0.0 - 1.2 mg/dL - 0.3 0.6  Alkaline Phos 44 - 121 IU/L - 133(H) -  AST 0 - 40 IU/L - 23 18  ALT 0 - 32 IU/L - 22 15    Lab Results  Component Value Date   WBC 6.8 10/25/2020   HGB 13.5 10/25/2020   HCT 42.3 10/25/2020   MCV 82 10/25/2020   PLT 203 10/25/2020   NEUTROABS 3,419 04/07/2019    ASSESSMENT & PLAN:  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:Screening mammogram showed left breast calcifications. Diagnostic mammogram showed left breast calcifications spanning 1.1cm. Biopsy showed DCIS with apocrine features, calcifications, and necrosis, high grade, ER/PR negative.   07/08/2019:Left lumpectomy Marlou Starks): high grade DCIS, 1.5cm, clear margins. 09/01/2019: Completed adjuvant radiation   Current treatment: Surveillance Breast cancer surveillance: Mammogram 05/27/2021: Benign breast density category B   Return to clinic on an as-needed basis as the patient follows with Dr. Marlou Starks twice a year and gets mammograms annually.    No orders of the defined types were placed in this encounter.  The patient has a good understanding of the overall plan. she agrees with it. she will call with any problems that may develop before the next visit here.  Total time spent: 20 mins including face to face time and time spent for planning, charting and coordination of care  Rulon Eisenmenger, MD, MPH 06/14/2021  I, Thana Ates, am acting as scribe for Dr.  Nicholas Lose.  I have reviewed the above documentation for accuracy and completeness, and I agree with the above.

## 2021-06-14 ENCOUNTER — Other Ambulatory Visit: Payer: Self-pay

## 2021-06-14 ENCOUNTER — Inpatient Hospital Stay: Payer: Medicare Other | Attending: Hematology and Oncology | Admitting: Hematology and Oncology

## 2021-06-14 DIAGNOSIS — Z886 Allergy status to analgesic agent status: Secondary | ICD-10-CM | POA: Diagnosis not present

## 2021-06-14 DIAGNOSIS — Z923 Personal history of irradiation: Secondary | ICD-10-CM | POA: Insufficient documentation

## 2021-06-14 DIAGNOSIS — Z888 Allergy status to other drugs, medicaments and biological substances status: Secondary | ICD-10-CM | POA: Diagnosis not present

## 2021-06-14 DIAGNOSIS — Z79899 Other long term (current) drug therapy: Secondary | ICD-10-CM | POA: Diagnosis not present

## 2021-06-14 DIAGNOSIS — D0512 Intraductal carcinoma in situ of left breast: Secondary | ICD-10-CM | POA: Insufficient documentation

## 2021-06-14 NOTE — Assessment & Plan Note (Signed)
06/11/2019:Screening mammogram showed left breast calcifications. Diagnostic mammogram showed left breast calcifications spanning 1.1cm. Biopsy showed DCIS with apocrine features, calcifications, and necrosis, high grade, ER/PR negative.  07/08/2019:Left lumpectomy Marlou Starks): high grade DCIS, 1.5cm, clear margins. 09/01/2019: Completed adjuvant radiation  Current treatment: Surveillance Breast cancer surveillance: 1.  Breast exam 06/14/2021: Benign 2. Mammogram 05/27/2021: Benign breast density category B  Return to clinic in 1 year for follow-up

## 2021-06-21 ENCOUNTER — Ambulatory Visit (HOSPITAL_COMMUNITY): Payer: Medicare Other | Attending: Internal Medicine | Admitting: Physical Therapy

## 2021-06-21 ENCOUNTER — Encounter (HOSPITAL_COMMUNITY): Payer: Self-pay | Admitting: Physical Therapy

## 2021-06-21 ENCOUNTER — Other Ambulatory Visit: Payer: Self-pay

## 2021-06-21 DIAGNOSIS — M7989 Other specified soft tissue disorders: Secondary | ICD-10-CM | POA: Insufficient documentation

## 2021-06-21 DIAGNOSIS — R262 Difficulty in walking, not elsewhere classified: Secondary | ICD-10-CM | POA: Diagnosis not present

## 2021-06-21 DIAGNOSIS — M25561 Pain in right knee: Secondary | ICD-10-CM | POA: Diagnosis not present

## 2021-06-21 DIAGNOSIS — I890 Lymphedema, not elsewhere classified: Secondary | ICD-10-CM | POA: Diagnosis not present

## 2021-06-21 DIAGNOSIS — G8929 Other chronic pain: Secondary | ICD-10-CM | POA: Diagnosis not present

## 2021-06-21 NOTE — Therapy (Signed)
[place], [place], [address] Phone: [phone]   Fax:  [phone]  Physical Therapy Evaluation  Patient Details  Name: Kara [name] MRN: [id number] Date of Birth: [birth year] Referring Provider (PT): Ihor Dow   Encounter Date: 06/21/2021   PT End of Session - 06/21/21 1623     Visit Number 1    Number of Visits 18    Date for PT Re-Evaluation 08/02/21    Authorization Type medicare/bcbs    Authorization - Visit Number 1    Authorization - Number of Visits 18    Progress Note Due on Visit 10    PT Start Time 1455    PT Stop Time 1605    PT Time Calculation (min) 70 min    Activity Tolerance Patient tolerated treatment well    Behavior During Therapy [facility] for tasks assessed/performed             Past Medical History:  Diagnosis Date   Allergy    food allergies, drug allergies   Anemia    Anxiety    Arthritis    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  The STAT Breast cancer panel offered by [facility] includes sequencing and rearrangement analysis for the following 9 genes:  ATM, BRCA1, BRCA2, CDH1, CHEK2, PALB2, PTEN, STK11 and TP53.   The report date is August 09, 2019.   GERD (gastroesophageal reflux disease)    H/O swallowed foreign body 11/20/2018   Headache in back of head 12/31/2018   Hip pain, acute, left 03/27/2019   HLD (hyperlipidemia) 08/23/2016   Hyperlipidemia    Hypothyroid 08/14/2016   Insomnia due to stress 12/23/2018   Malignant neoplasm of  upper-outer quadrant of left breast in female, estrogen receptor negative ([place]) 06/11/2019   DCIS left breast   Perimenopausal vasomotor symptoms 02/11/2016   Personal history of radiation therapy 2021   completed left breast radiation in April 2021   Postmenopausal atrophic vaginitis 11/09/2015   Pre-diabetes    no meds   Prediabetes 08/30/2017   Thyroid disease    Grave's   Vaginal discharge 01/16/2019   Weight loss, unintentional 03/27/2019    Past Surgical History:  Procedure Laterality Date   ABDOMINAL HYSTERECTOMY     bleeding. fibroids   BREAST BIOPSY Left 05/2019   left breast DCIS   BREAST LUMPECTOMY Left 06/2019   DCIS   BREAST LUMPECTOMY WITH RADIOACTIVE SEED LOCALIZATION Left 07/08/2019   Procedure: LEFT BREAST LUMPECTOMY WITH RADIOACTIVE SEED LOCALIZATION;  Surgeon: Jovita Kussmaul, MD;  Location: [facility];  Service: General;  Laterality: Left;   BREAST SURGERY N/A    Phreesia 08/30/2020   CESAREAN SECTION     CESAREAN SECTION N/A    Phreesia 08/30/2020   CHOLECYSTECTOMY     COLONOSCOPY  2011   [place]: two 3-4 mm sessile polyps, hyperplastic, sigmoid diverticula, TI appeared normal.    ESOPHAGOGASTRODUODENOSCOPY  2011   [place]: non-bleeding erosive  gastropathy, normal duodenum. path: unremarkable duodenum, negative H.pylori, minimal esophagitis    TONSILLECTOMY  1975    There were no vitals filed for this visit.    Subjective Assessment - 06/21/21 1505     Subjective Ms. Holness states that she has had lymphedema for years, she has not had cellulitis.  She has been wearing compression garment daily, her garments are knee high.  Pt does not have a compression pump.    Pertinent History lymphedema, anxiety,    Patient Stated Goals swelling in LE to be less to fit into shows    Currently in Pain? No/denies                [facility] PT Assessment - 06/21/21 0001       Assessment   Medical Diagnosis B LE lymphedema    Referring Provider (PT)  Rutwik Patel    Onset Date/Surgical Date --   chronic   Next MD Visit not scheduled    Prior Therapy none      Precautions   Precautions --   cellulitis     Restrictions   Weight Bearing Restrictions No      Balance Screen   Has the patient fallen in the past 6 months No    Has the patient had a decrease in activity level because of a fear of falling?  No    Is the patient reluctant to leave their home because of a fear of falling?  No      Cognition   Overall Cognitive Status Within Functional Limits for tasks assessed               LYMPHEDEMA/ONCOLOGY QUESTIONNAIRE - 06/21/21 0001       What other symptoms do you have   Are you Having Heaviness or Tightness Yes    Are you having Pain No    Are you having pitting edema Yes    Is it Hard or Difficult finding clothes that fit Yes    Do you have infections No    Stemmer Sign No      Lymphedema Stage   Stage STAGE 2 SPONTANEOUSLY IRREVERSIBLE      Lymphedema Assessments   Lymphedema Assessments Lower extremities      Right Lower Extremity Lymphedema   20 cm Proximal to Suprapatella 66.5 cm    10 cm Proximal to Suprapatella 60.4 cm    At Midpatella/Popliteal Crease 51.2 cm    30 cm Proximal to Floor at Lateral Plantar Foot 44.2 cm    20 cm Proximal to Floor at Lateral Plantar Foot 34._0 cm Proximal to Floor at Lateral Malleoli 54 cm    Circumference of ankle/heel 38.3 cm.    5 cm Proximal to 1st MTP Joint 26.3 cm    Across MTP Joint 25.2 cm    Around Proximal Great Toe 9.2 cm      Left Lower Extremity Lymphedema   20 cm Proximal to Suprapatella 68.5 cm    10 cm Proximal to Suprapatella 61 cm    At Midpatella/Popliteal Crease 50.5 cm    30 cm Proximal to Floor at Lateral Plantar Foot 44.3 cm    20 cm Proximal to Floor at Lateral Plantar Foot 36.3 cm    10 cm Proximal to Floor at Lateral Malleoli 31.4 cm    Circumference of ankle/heel 37.6 cm.    5 cm Proximal to 1st MTP Joint 26 cm    Across MTP Joint  25.5 cm    Around Proximal Great Toe 9 cm                     Objective measurements completed on examination: See above findings.        Exercises:  ankle pump, LAQ, hip ab/adduction, marching, diaphragm breathing, lymph squeeze,         PT Education - 06/21/21 1622     Education Details What Lymphedema is and the four aspects of treatment    Person(s) Educated Patient    Methods Explanation;Handout    Comprehension Verbalized understanding              PT Short Term Goals - 06/21/21 1629       PT SHORT TERM GOAL #1   Title Pt will be I in self massage techniques to assist in decreasing fluid volume.    Time 3    Period Weeks    Status New    Target Date 07/12/21      PT SHORT TERM GOAL #2   Title PT will be I in LE exercising to assist in decreasing volume    Time 3    Period Weeks    Status New      PT SHORT TERM GOAL #3   Title Pt will have obtained thigh high compression    Time 3    Period Weeks    Status New               PT Long Term Goals - 06/21/21 1631       PT LONG TERM GOAL #1   Title PT to have lost 2-3 cm in measurements to decrease risk of cellulitis and allow pt to fit into her shoes.    Time 6    Period Weeks    Status New    Target Date 08/02/21      PT LONG TERM GOAL #2   Title PT will have a compression pump and be using on a daily basis    Time 6    Period Weeks    Status New                    Plan - 06/21/21 1624     Clinical Impression Statement Ms. Gorr is a 76 yo female who has been diagnosed with lymphedema for years.  She was told to wear compression garments which she has but was not given any other information.  She changed MD's recently and he suggested that she sees a lymphedema therapist.  Ms. Seppala is wearing 20-30 mmhg knee high garments, they are not surgical weight.  The therapist educated her that she felt the surgical wt will do better for her at this stage of lymphedema.  She  was educated on the four aspects of treatment for lymphedema and on self manual.  At this time Ms Furber would like to try Phase I, II, III of treatment.  She will decide in the next week if she would like to progress to the compression bandaging.  She will benefit from a compression pump to assist in decreasing her increased volume.    Personal Factors and Comorbidities Time since onset of injury/illness/exacerbation;Fitness    Examination-Activity Limitations Locomotion Level;Dressing    Examination-Participation Restrictions Shop    Stability/Clinical Decision Making Evolving/Moderate complexity    Clinical Decision Making Moderate    Rehab Potential Good    PT Frequency 3x / week  PT Duration 6 weeks    PT Treatment/Interventions Patient/family education;Therapeutic exercise;Manual lymph drainage;Compression bandaging    PT Next Visit Plan Review self manual and any questions on the exercises.  Begin manual techniques    HEP: Exercises:  ankle pump, LAQ, hip ab/adduction, marching, diaphragm breathing, lymph squeeze, self massage         Patient will benefit from skilled therapeutic intervention in order to improve the following deficits and impairments:  Decreased skin integrity, Increased edema, Obesity  Visit Diagnosis: Lymphedema, not elsewhere classified     Problem List Patient Active Problem List   Diagnosis Date Noted   Leg swelling 05/25/2021   Vitamin D deficiency 03/11/2021   Plantar fasciitis of right foot 10/13/2020   Trigger middle finger of right hand 10/13/2020   Palpitation 04/29/2020   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   Shortness of breath 02/04/2020   Trochanteric bursitis of left hip 12/30/2019   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

## 2021-06-23 ENCOUNTER — Ambulatory Visit (HOSPITAL_COMMUNITY): Payer: Medicare Other | Admitting: Physical Therapy

## 2021-06-23 ENCOUNTER — Other Ambulatory Visit: Payer: Self-pay

## 2021-06-23 DIAGNOSIS — R262 Difficulty in walking, not elsewhere classified: Secondary | ICD-10-CM | POA: Diagnosis not present

## 2021-06-23 DIAGNOSIS — I890 Lymphedema, not elsewhere classified: Secondary | ICD-10-CM

## 2021-06-23 DIAGNOSIS — M7989 Other specified soft tissue disorders: Secondary | ICD-10-CM | POA: Diagnosis not present

## 2021-06-23 DIAGNOSIS — G8929 Other chronic pain: Secondary | ICD-10-CM

## 2021-06-23 DIAGNOSIS — M25561 Pain in right knee: Secondary | ICD-10-CM | POA: Diagnosis not present

## 2021-06-23 NOTE — Therapy (Signed)
[place], [place], [address] Phone: [phone]   Fax:  [phone]  Physical Therapy Treatment  Patient Details  Name: Kara [name] MRN: [id number] Date of Birth: [birth year] Referring Provider (PT): Ihor Dow   Encounter Date: 06/23/2021   PT End of Session - 06/23/21 1639     Visit Number 2    Number of Visits 18    Date for PT Re-Evaluation 08/02/21    Authorization Type medicare/bcbs    Authorization - Visit Number 2    Authorization - Number of Visits 18    Progress Note Due on Visit 10    PT Start Time 0092    PT Stop Time 1632    PT Time Calculation (min) 62 min    Activity Tolerance Patient tolerated treatment well    Behavior During Therapy [facility] for tasks assessed/performed             Past Medical History:  Diagnosis Date   Allergy    food allergies, drug allergies   Anemia    Anxiety    Arthritis    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  The STAT Breast cancer panel offered by [facility] includes sequencing and rearrangement analysis for the following 9 genes:  ATM, BRCA1, BRCA2, CDH1, CHEK2, PALB2, PTEN, STK11 and TP53.   The report date is August 09, 2019.   GERD (gastroesophageal reflux disease)    H/O swallowed foreign body 11/20/2018   Headache in back of head 12/31/2018   Hip pain, acute, left 03/27/2019   HLD (hyperlipidemia) 08/23/2016   Hyperlipidemia    Hypothyroid 08/14/2016   Insomnia due to stress 12/23/2018   Malignant neoplasm of upper-outer  quadrant of left breast in female, estrogen receptor negative ([place]) 06/11/2019   DCIS left breast   Perimenopausal vasomotor symptoms 02/11/2016   Personal history of radiation therapy 2021   completed left breast radiation in April 2021   Postmenopausal atrophic vaginitis 11/09/2015   Pre-diabetes    no meds   Prediabetes 08/30/2017   Thyroid disease    Grave's   Vaginal discharge 01/16/2019   Weight loss, unintentional 03/27/2019    Past Surgical History:  Procedure Laterality Date   ABDOMINAL HYSTERECTOMY     bleeding. fibroids   BREAST BIOPSY Left 05/2019   left breast DCIS   BREAST LUMPECTOMY Left 06/2019   DCIS   BREAST LUMPECTOMY WITH RADIOACTIVE SEED LOCALIZATION Left 07/08/2019   Procedure: LEFT BREAST LUMPECTOMY WITH RADIOACTIVE SEED LOCALIZATION;  Surgeon: Jovita Kussmaul, MD;  Location: [facility];  Service: General;  Laterality: Left;   BREAST SURGERY N/A    [employer]reesia 08/30/2020   CESAREAN SECTION     CESAREAN SECTION N/A    [employer]reesia 08/30/2020   CHOLECYSTECTOMY     COLONOSCOPY  2011   [place]: two 3-4 mm sessile polyps, hyperplastic, sigmoid diverticula, TI appeared normal.    ESOPHAGOGASTRODUODENOSCOPY  2011   [place]: non-bleeding erosive  gastropathy, normal duodenum. path: unremarkable duodenum, negative H.pylori, minimal esophagitis    TONSILLECTOMY  1975    There were no vitals filed for this visit.   Subjective Assessment - 06/23/21 1533     Subjective PT states that she has been completing her skin care and exercises as well as trying her self manual    Pertinent History lymphedema, anxiety,    Patient Stated Goals swelling in LE to be less to fit into shows    Currently in Pain? No/denies                               [facility] Adult PT Treatment/Exercise - 06/23/21 0001       Manual Therapy   Manual Therapy Manual Lymphatic Drainage (MLD);Compression Bandaging    Manual therapy comments completed seperate from all  other aspects of treatment    Manual Lymphatic Drainage (MLD) To include supraclavicular, deep and superficial abdominal, inguingal/axillary anastomosis and B LE both anterior and  posterior    Compression Bandaging profore compression bandaging to B LE                       PT Short Term Goals - 06/23/21 1642       PT SHORT TERM GOAL #1   Title Pt will be I in self massage techniques to assist in decreasing fluid volume.    Time 3    Period Weeks    Status On-going    Target Date 07/12/21      PT SHORT TERM GOAL #2   Title PT will be I in LE exercising to assist in decreasing volume    Time 3    Period Weeks    Status Achieved      PT SHORT TERM GOAL #3   Title Pt will have obtained thigh high compression    Time 3    Period Weeks    Status On-going               PT Long Term Goals - 06/23/21 1642       PT LONG TERM GOAL #1   Title PT to have lost 2-3 cm in measurements to decrease risk of cellulitis and allow pt to fit into her shoes.    Time 6    Period Weeks    Status On-going    Target Date 08/02/21      PT LONG TERM GOAL #2   Title PT will have a compression pump and be using on a daily basis    Time 6    Period Weeks    Status On-going                   Plan - 06/23/21 1639     Clinical Impression Statement Pt with no questions on self manual or exercisees.  Therapist suggested profore compresssion to assess how pt does with decongestion via compression bandaging, pt in agreement.  Therapist ordered compression pump for pt    Personal Factors and Comorbidities Time since onset of injury/illness/exacerbation;Fitness    Examination-Activity Limitations Locomotion Level;Dressing    Examination-Participation Restrictions Shop    Stability/Clinical Decision Making Evolving/Moderate complexity    Rehab Potential Good    PT Frequency 3x / week    PT Duration 6 weeks    PT Treatment/Interventions Patient/family education;Therapeutic  exercise;Manual lymph drainage;Compression bandaging    PT Next Visit Plan Assess  comfort of profore, continue with total decongestive techniques.  Measure on Wednesday.    PT Home Exercise Plan Exercises:  ankle pump, LAQ, hip ab/adduction, marching, diaphragm breathing, lymph squeeze, self massage             Patient will benefit from skilled therapeutic intervention in order to improve the following deficits and impairments:  Decreased skin integrity, Increased edema, Obesity  Visit Diagnosis: Lymphedema, not elsewhere classified  Chronic pain of right knee  Difficulty in walking, not elsewhere classified     Problem List Patient Active Problem List   Diagnosis Date Noted   Leg swelling 05/25/2021   Vitamin D deficiency 03/11/2021   Plantar fasciitis of right foot 10/13/2020   Trigger middle finger of right hand 10/13/2020   Palpitation 04/29/2020   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   Shortness of breath 02/04/2020   Trochanteric bursitis of left hip 12/30/2019   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

## 2021-06-27 ENCOUNTER — Ambulatory Visit (HOSPITAL_COMMUNITY): Payer: Medicare Other | Admitting: Physical Therapy

## 2021-06-27 ENCOUNTER — Other Ambulatory Visit: Payer: Self-pay

## 2021-06-27 DIAGNOSIS — G8929 Other chronic pain: Secondary | ICD-10-CM | POA: Diagnosis not present

## 2021-06-27 DIAGNOSIS — I890 Lymphedema, not elsewhere classified: Secondary | ICD-10-CM

## 2021-06-27 DIAGNOSIS — R262 Difficulty in walking, not elsewhere classified: Secondary | ICD-10-CM

## 2021-06-27 DIAGNOSIS — M25561 Pain in right knee: Secondary | ICD-10-CM | POA: Diagnosis not present

## 2021-06-27 DIAGNOSIS — M7989 Other specified soft tissue disorders: Secondary | ICD-10-CM | POA: Diagnosis not present

## 2021-06-27 NOTE — Therapy (Signed)
[place], [place], [address] Phone: [phone]   Fax:  [phone]  Physical Therapy Treatment  Patient Details  Name: Kara [name] MRN: [id number] Date of Birth: Jan 13, 1946 Referring Provider (PT): Ihor Dow   Encounter Date: 06/27/2021   PT End of Session - 06/27/21 1726     Visit Number 3    Number of Visits 18    Date for PT Re-Evaluation 08/02/21    Authorization Type medicare/bcbs    Authorization - Visit Number 3    Authorization - Number of Visits 18    Progress Note Due on Visit 10    PT Start Time 9379    PT Stop Time 1546    PT Time Calculation (min) 59 min    Activity Tolerance Patient tolerated treatment well    Behavior During Therapy [facility] for tasks assessed/performed             Past Medical History:  Diagnosis Date   Allergy    food allergies, drug allergies   Anemia    Anxiety    Arthritis    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  The STAT Breast cancer panel offered by [facility] includes sequencing and rearrangement analysis for the following 9 genes:  ATM, BRCA1, BRCA2, CDH1, CHEK2, PALB2, PTEN, STK11 and TP53.   The report date is August 09, 2019.   GERD (gastroesophageal reflux disease)    H/O swallowed foreign body 11/20/2018   Headache in back of head 12/31/2018   Hip pain, acute, left 03/27/2019   HLD (hyperlipidemia) 08/23/2016   Hyperlipidemia    Hypothyroid 08/14/2016   Insomnia due to stress 12/23/2018   Malignant neoplasm of  upper-outer quadrant of left breast in female, estrogen receptor negative ([place]) 06/11/2019   DCIS left breast   Perimenopausal vasomotor symptoms 02/11/2016   Personal history of radiation therapy 2021   completed left breast radiation in April 2021   Postmenopausal atrophic vaginitis 11/09/2015   Pre-diabetes    no meds   Prediabetes 08/30/2017   Thyroid disease    Grave's   Vaginal discharge 01/16/2019   Weight loss, unintentional 03/27/2019    Past Surgical History:  Procedure Laterality Date   ABDOMINAL HYSTERECTOMY     bleeding. fibroids   BREAST BIOPSY Left 05/2019   left breast DCIS   BREAST LUMPECTOMY Left 06/2019   DCIS   BREAST LUMPECTOMY WITH RADIOACTIVE SEED LOCALIZATION Left 07/08/2019   Procedure: LEFT BREAST LUMPECTOMY WITH RADIOACTIVE SEED LOCALIZATION;  Surgeon: Jovita Kussmaul, MD;  Location: [facility];  Service: General;  Laterality: Left;   BREAST SURGERY N/A    Phreesia 08/30/2020   CESAREAN SECTION     CESAREAN SECTION N/A    Phreesia 08/30/2020   CHOLECYSTECTOMY     COLONOSCOPY  2011   [place]: two 3-4 mm sessile polyps, hyperplastic, sigmoid diverticula, TI appeared normal.    ESOPHAGOGASTRODUODENOSCOPY  2011   [place]: non-bleeding erosive  gastropathy, normal duodenum. path: unremarkable duodenum, negative H.pylori, minimal esophagitis    TONSILLECTOMY  1975    There were no vitals filed for this visit.   Subjective Assessment - 06/27/21 1725     Subjective pt states she had to remove her bandaging because they were making her legs hurt.  STates when she laid down to sleep thats when they started hurting and swelling.  No other issues today    Currently in Pain? No/denies                               [facility] Adult PT Treatment/Exercise - 06/27/21 0001       Manual Therapy   Manual Therapy Manual Lymphatic Drainage (MLD);Compression Bandaging    Manual therapy comments completed seperate from all other aspects of  treatment    Manual Lymphatic Drainage (MLD) To include supraclavicular, deep and superficial abdominal, inguingal/axillary anastomosis and B LE both anterior and  posterior    Compression Bandaging profore lite compression bandaging to B LE                       PT Short Term Goals - 06/23/21 1642       PT SHORT TERM GOAL #1   Title Pt will be I in self massage techniques to assist in decreasing fluid volume.    Time 3    Period Weeks    Status On-going    Target Date 07/12/21      PT SHORT TERM GOAL #2   Title PT will be I in LE exercising to assist in decreasing volume    Time 3    Period Weeks    Status Achieved      PT SHORT TERM GOAL #3   Title Pt will have obtained thigh high compression    Time 3    Period Weeks    Status On-going               PT Long Term Goals - 06/23/21 1642       PT LONG TERM GOAL #1   Title PT to have lost 2-3 cm in measurements to decrease risk of cellulitis and allow pt to fit into her shoes.    Time 6    Period Weeks    Status On-going    Target Date 08/02/21      PT LONG TERM GOAL #2   Title PT will have a compression pump and be using on a daily basis    Time 6    Period Weeks    Status On-going                   Plan - 06/27/21 1727     Clinical Impression Statement Educated pateint on Lymphedema, how compression is intrigal part of treatment and discussed symptoms she was describing.  Discussed with evaluating therapist as well due to complaints of pain, edema with elevation.  Pt did express when she removed the bandages her legs were much smaller.  Decided to trial profore lite this session instead of profore.  Short session of manual completetd to anterior only, moisturized and bandaged with profore lite.  Pt reported it feeling much better at end of session.    Personal Factors and Comorbidities Time since onset of injury/illness/exacerbation;Fitness    Examination-Activity Limitations Locomotion  Level;Dressing    Examination-Participation Restrictions Shop    Stability/Clinical Decision  Making Evolving/Moderate complexity    Rehab Potential Good    PT Frequency 3x / week    PT Duration 6 weeks    PT Treatment/Interventions Patient/family education;Therapeutic exercise;Manual lymph drainage;Compression bandaging    PT Next Visit Plan Assess comfort of profore lite and measure next session.  If no significant reduction, may want to return to profore but add 1/2" foam to foot, ankle and leg for comfort.   Measure on Wednesday.    PT Home Exercise Plan Exercises:  ankle pump, LAQ, hip ab/adduction, marching, diaphragm breathing, lymph squeeze, self massage             Patient will benefit from skilled therapeutic intervention in order to improve the following deficits and impairments:  Decreased skin integrity, Increased edema, Obesity  Visit Diagnosis: Lymphedema, not elsewhere classified  Difficulty in walking, not elsewhere classified  Chronic pain of right knee     Problem List Patient Active Problem List   Diagnosis Date Noted   Leg swelling 05/25/2021   Vitamin D deficiency 03/11/2021   Plantar fasciitis of right foot 10/13/2020   Trigger middle finger of right hand 10/13/2020   Palpitation 04/29/2020   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   Shortness of breath 02/04/2020   Trochanteric bursitis of left hip 12/30/2019   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

## 2021-06-29 ENCOUNTER — Other Ambulatory Visit: Payer: Self-pay

## 2021-06-29 ENCOUNTER — Ambulatory Visit (HOSPITAL_COMMUNITY): Payer: Medicare Other | Admitting: Physical Therapy

## 2021-06-29 DIAGNOSIS — M25561 Pain in right knee: Secondary | ICD-10-CM | POA: Diagnosis not present

## 2021-06-29 DIAGNOSIS — R262 Difficulty in walking, not elsewhere classified: Secondary | ICD-10-CM | POA: Diagnosis not present

## 2021-06-29 DIAGNOSIS — G8929 Other chronic pain: Secondary | ICD-10-CM | POA: Diagnosis not present

## 2021-06-29 DIAGNOSIS — I890 Lymphedema, not elsewhere classified: Secondary | ICD-10-CM

## 2021-06-29 DIAGNOSIS — M7989 Other specified soft tissue disorders: Secondary | ICD-10-CM | POA: Diagnosis not present

## 2021-06-29 NOTE — Therapy (Signed)
[place], [place], [address] Phone: [phone]   Fax:  [phone]  Physical Therapy Treatment  Patient Details  Name: Kara [name] MRN: [id number] Date of Birth: 03/26/1946 Referring Provider (PT): Ihor Dow   Encounter Date: 06/29/2021   PT End of Session - 06/29/21 1453     Visit Number 4    Number of Visits 18    Date for PT Re-Evaluation 08/02/21    Authorization Type medicare/bcbs    Authorization - Visit Number 4    Authorization - Number of Visits 18    Progress Note Due on Visit 10    PT Start Time 1050    PT Stop Time 1140    PT Time Calculation (min) 50 min    Activity Tolerance Patient tolerated treatment well    Behavior During Therapy [facility] for tasks assessed/performed             Past Medical History:  Diagnosis Date   Allergy    food allergies, drug allergies   Anemia    Anxiety    Arthritis    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  The STAT Breast cancer panel offered by [facility] includes sequencing and rearrangement analysis for the following 9 genes:  ATM, BRCA1, BRCA2, CDH1, CHEK2, PALB2, PTEN, STK11 and TP53.   The report date is August 09, 2019.   GERD (gastroesophageal reflux disease)    H/O swallowed foreign body 11/20/2018   Headache in back of head 12/31/2018   Hip pain, acute, left 03/27/2019   HLD (hyperlipidemia) 08/23/2016   Hyperlipidemia    Hypothyroid 08/14/2016   Insomnia due to stress 12/23/2018   Malignant neoplasm of  upper-outer quadrant of left breast in female, estrogen receptor negative ([place]) 06/11/2019   DCIS left breast   Perimenopausal vasomotor symptoms 02/11/2016   Personal history of radiation therapy 2021   completed left breast radiation in April 2021   Postmenopausal atrophic vaginitis 11/09/2015   Pre-diabetes    no meds   Prediabetes 08/30/2017   Thyroid disease    Grave's   Vaginal discharge 01/16/2019   Weight loss, unintentional 03/27/2019    Past Surgical History:  Procedure Laterality Date   ABDOMINAL HYSTERECTOMY     bleeding. fibroids   BREAST BIOPSY Left 05/2019   left breast DCIS   BREAST LUMPECTOMY Left 06/2019   DCIS   BREAST LUMPECTOMY WITH RADIOACTIVE SEED LOCALIZATION Left 07/08/2019   Procedure: LEFT BREAST LUMPECTOMY WITH RADIOACTIVE SEED LOCALIZATION;  Surgeon: Jovita Kussmaul, MD;  Location: [facility];  Service: General;  Laterality: Left;   BREAST SURGERY N/A    Phreesia 08/30/2020   CESAREAN SECTION     CESAREAN SECTION N/A    Phreesia 08/30/2020   CHOLECYSTECTOMY     COLONOSCOPY  2011   [place]: two 3-4 mm sessile polyps, hyperplastic, sigmoid diverticula, TI appeared normal.    ESOPHAGOGASTRODUODENOSCOPY  2011   [place]: non-bleeding erosive  gastropathy, normal duodenum. path: unremarkable duodenum, negative H.pylori, minimal esophagitis    TONSILLECTOMY  1975    There were no vitals filed for this visit.   Subjective Assessment - 06/29/21 1433     Subjective Pt states she kept them on but wanted to take them off.  STates they were a little more comfortable but still made her legs swell.  States they also slid down some and that was uncomfortable.                   LYMPHEDEMA/ONCOLOGY QUESTIONNAIRE - 06/29/21 1436       Lymphedema Stage   Stage STAGE 2 SPONTANEOUSLY IRREVERSIBLE      Lymphedema Assessments   Lymphedema Assessments Lower extremities      Right Lower Extremity Lymphedema   20 cm Proximal to Suprapatella  66.5 cm   was 66.5   10 cm Proximal to Suprapatella 60.4 cm   was 60.4   At Midpatella/Popliteal Crease 51.2 cm   was 51.2   30 cm Proximal to Floor at Lateral Plantar Foot 44.8 cm   was 44.2   20 cm Proximal to Floor at Lateral Plantar Foot 34.5 1   was 34.9   10 cm Proximal to Floor at Lateral Malleoli 30.5 cm   was 34   Circumference of ankle/heel 37 cm.   was 38.3   5 cm Proximal to 1st MTP Joint 26.3 cm   was 26.3   Across MTP Joint 24.3 cm   was 25.2   Around Proximal Great Toe 9.2 cm   was 9.2   Other measurements compared to 2/7 evaluation      Left Lower Extremity Lymphedema   20 cm Proximal to Suprapatella 68.5 cm   was 68.5   10 cm Proximal to Suprapatella 61 cm   was 61   At Midpatella/Popliteal Crease 50.5 cm   was 50.5   30 cm Proximal to Floor at Lateral Plantar Foot 45.7 cm   was 44.3   20 cm Proximal to Floor at Lateral Plantar Foot 36 cm   was 36.3   10 cm Proximal to Floor at Lateral Malleoli 30.5 cm   wa 31.4   Circumference of ankle/heel 37 cm.   was 37.6   5 cm Proximal to 1st MTP Joint 27 cm   was 26   Across MTP Joint 24 cm   was 25.5   Around Proximal Great Toe 9 cm   was 9   Other measurements compared to 2/7 evaluation                        [facility] Adult PT Treatment/Exercise - 06/29/21 0001       Manual Therapy   Manual Therapy Compression Bandaging;Other (comment)    Manual therapy comments completed seperate from all other aspects of treatment    Manual Lymphatic Drainage (MLD) To include supraclavicular, deep and superficial abdominal, inguingal/axillary anastomosis and B LE both anterior and  posterior    Compression Bandaging profore regular with 1/2" foam bandaging to B LE    Other Manual Therapy measurements                     PT Education - 06/29/21 1455     Education Details see assessment.  Ongoing education on purpose of lymphedema treatment and use of compression    Person(s) Educated Patient    Methods  Explanation    Comprehension Verbalized  understanding              PT Short Term Goals - 06/23/21 1642       PT SHORT TERM GOAL #1   Title Pt will be I in self massage techniques to assist in decreasing fluid volume.    Time 3    Period Weeks    Status On-going    Target Date 07/12/21      PT SHORT TERM GOAL #2   Title PT will be I in LE exercising to assist in decreasing volume    Time 3    Period Weeks    Status Achieved      PT SHORT TERM GOAL #3   Title Pt will have obtained thigh high compression    Time 3    Period Weeks    Status On-going               PT Long Term Goals - 06/23/21 1642       PT LONG TERM GOAL #1   Title PT to have lost 2-3 cm in measurements to decrease risk of cellulitis and allow pt to fit into her shoes.    Time 6    Period Weeks    Status On-going    Target Date 08/02/21      PT LONG TERM GOAL #2   Title PT will have a compression pump and be using on a daily basis    Time 6    Period Weeks    Status On-going                   Plan - 06/29/21 1454     Clinical Impression Statement LEs remeasured this session.  Overall visual improvement in LEs without induration and Slight reduction noted in bilateral LEs, approx. 1 cm each LE.  Educated pt. entire time during session and encouraged to research lymphedema and lymphedema treatments on her own so she would know more about the disease process and how it is treated.  Pt with questions regarding need for bandaging and why we couldnt just use the stockings, wearing the bandaging at night and general purpose of treatment..  Noted discouragement by patient and hesitancy to continue with treatment.  Discussed reasoning for sliding of bandaging was due to decompression and the addition of  foam would help this and improve comfort.  PT willing to try this and return to regular profore.  Pt shown short stretch and discussed ordering/pricing.  Educated that this is the gold  standard and would be most beneficial if she chooses to continue with her treatment.    Personal Factors and Comorbidities Time since onset of injury/illness/exacerbation;Fitness    Examination-Activity Limitations Locomotion Level;Dressing    Examination-Participation Restrictions Shop    Stability/Clinical Decision Making Evolving/Moderate complexity    Rehab Potential Good    PT Frequency 3x / week    PT Duration 6 weeks    PT Treatment/Interventions Patient/family education;Therapeutic exercise;Manual lymph drainage;Compression bandaging    PT Next Visit Plan Assess comfort of adding foam next session.  discuss ordering short stretch if she wishes to continue treatemnt.    Measure on Wednesday.    PT Home Exercise Plan Exercises:  ankle pump, LAQ, hip ab/adduction, marching, diaphragm breathing, lymph squeeze, self massage             Patient will benefit from skilled therapeutic intervention in order to improve the following deficits and impairments:  Decreased skin integrity, Increased edema,  Obesity  Visit Diagnosis: Lymphedema, not elsewhere classified  Difficulty in walking, not elsewhere classified  Chronic pain of right knee     Problem List Patient Active Problem List   Diagnosis Date Noted   Leg swelling 05/25/2021   Vitamin D deficiency 03/11/2021   Plantar fasciitis of right foot 10/13/2020   Trigger middle finger of right hand 10/13/2020   Palpitation 04/29/2020   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   Shortness of breath 02/04/2020   Trochanteric bursitis of left hip 12/30/2019   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

## 2021-07-01 ENCOUNTER — Ambulatory Visit (HOSPITAL_COMMUNITY): Payer: Medicare Other | Admitting: Physical Therapy

## 2021-07-01 ENCOUNTER — Other Ambulatory Visit: Payer: Self-pay

## 2021-07-01 DIAGNOSIS — M25561 Pain in right knee: Secondary | ICD-10-CM | POA: Diagnosis not present

## 2021-07-01 DIAGNOSIS — G8929 Other chronic pain: Secondary | ICD-10-CM | POA: Diagnosis not present

## 2021-07-01 DIAGNOSIS — R262 Difficulty in walking, not elsewhere classified: Secondary | ICD-10-CM

## 2021-07-01 DIAGNOSIS — I890 Lymphedema, not elsewhere classified: Secondary | ICD-10-CM

## 2021-07-01 DIAGNOSIS — M7989 Other specified soft tissue disorders: Secondary | ICD-10-CM | POA: Diagnosis not present

## 2021-07-01 NOTE — Therapy (Signed)
[place], [place], [address] Phone: [phone]   Fax:  [phone]  Physical Therapy Treatment  Patient Details  Name: Kara [name] MRN: [id number] Date of Birth: [birth year] Referring Provider (PT): Ihor Dow   Encounter Date: 07/01/2021   PT End of Session - 07/01/21 1143     Visit Number 5    Number of Visits 18    Date for PT Re-Evaluation 08/02/21    Authorization Type medicare/bcbs    Authorization - Visit Number 5    Authorization - Number of Visits 18    Progress Note Due on Visit 10    PT Start Time 1006    PT Stop Time 1130    PT Time Calculation (min) 84 min    Activity Tolerance Patient tolerated treatment well    Behavior During Therapy [facility] for tasks assessed/performed             Past Medical History:  Diagnosis Date   Allergy    food allergies, drug allergies   Anemia    Anxiety    Arthritis    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  The STAT Breast cancer panel offered by [facility] includes sequencing and rearrangement analysis for the following 9 genes:  ATM, BRCA1, BRCA2, CDH1, CHEK2, PALB2, PTEN, STK11 and TP53.   The report date is August 09, 2019.   GERD (gastroesophageal reflux disease)    H/O swallowed foreign body 11/20/2018   Headache in back of head 12/31/2018   Hip pain, acute, left 03/27/2019   HLD (hyperlipidemia) 08/23/2016   Hyperlipidemia    Hypothyroid 08/14/2016   Insomnia due to stress 12/23/2018   Malignant neoplasm of  upper-outer quadrant of left breast in female, estrogen receptor negative ([place]) 06/11/2019   DCIS left breast   Perimenopausal vasomotor symptoms 02/11/2016   Personal history of radiation therapy 2021   completed left breast radiation in April 2021   Postmenopausal atrophic vaginitis 11/09/2015   Pre-diabetes    no meds   Prediabetes 08/30/2017   Thyroid disease    Grave's   Vaginal discharge 01/16/2019   Weight loss, unintentional 03/27/2019    Past Surgical History:  Procedure Laterality Date   ABDOMINAL HYSTERECTOMY     bleeding. fibroids   BREAST BIOPSY Left 05/2019   left breast DCIS   BREAST LUMPECTOMY Left 06/2019   DCIS   BREAST LUMPECTOMY WITH RADIOACTIVE SEED LOCALIZATION Left 07/08/2019   Procedure: LEFT BREAST LUMPECTOMY WITH RADIOACTIVE SEED LOCALIZATION;  Surgeon: Jovita Kussmaul, MD;  Location: [facility];  Service: General;  Laterality: Left;   BREAST SURGERY N/A    Phreesia 08/30/2020   CESAREAN SECTION     CESAREAN SECTION N/A    Phreesia 08/30/2020   CHOLECYSTECTOMY     COLONOSCOPY  2011   [place]: two 3-4 mm sessile polyps, hyperplastic, sigmoid diverticula, TI appeared normal.    ESOPHAGOGASTRODUODENOSCOPY  2011   [place]: non-bleeding erosive  gastropathy, normal duodenum. path: unremarkable duodenum, negative H.pylori, minimal esophagitis    TONSILLECTOMY  1975    There were no vitals filed for this visit.   Subjective Assessment - 07/01/21 1141     Subjective Pt states she had to remove the brown outer bandage from her Rt LE as it was hurting badly.  STates shes up most of the day on her feet and rarely sits down.  Reports general discomfort in her LE's with wearing the bandaging.    Currently in Pain? No/denies                               [facility] Adult PT Treatment/Exercise - 07/01/21 0001       Manual Therapy   Manual Therapy Manual Lymphatic Drainage (MLD);Compression Bandaging    Manual therapy comments  completed seperate from all other aspects of treatment    Manual Lymphatic Drainage (MLD) To include supraclavicular, deep and superficial abdominal, inguingal/axillary anastomosis and B LE both anterior and  posterior    Compression Bandaging 6cm short stretch used for roman ankle and HAS with profore regular with 1/2" foam bandaging to B LE    Other Manual Therapy measurements                       PT Short Term Goals - 06/23/21 1642       PT SHORT TERM GOAL #1   Title Pt will be I in self massage techniques to assist in decreasing fluid volume.    Time 3    Period Weeks    Status On-going    Target Date 07/12/21      PT SHORT TERM GOAL #2   Title PT will be I in LE exercising to assist in decreasing volume    Time 3    Period Weeks    Status Achieved      PT SHORT TERM GOAL #3   Title Pt will have obtained thigh high compression    Time 3    Period Weeks    Status On-going               PT Long Term Goals - 06/23/21 1642       PT LONG TERM GOAL #1   Title PT to have lost 2-3 cm in measurements to decrease risk of cellulitis and allow pt to fit into her shoes.    Time 6    Period Weeks    Status On-going    Target Date 08/02/21      PT LONG TERM GOAL #2   Title PT will have a compression pump and be using on a daily basis    Time 6    Period Weeks    Status On-going                   Plan - 07/01/21 1149     Clinical Impression Statement Pt with general reduction in LEs but no change in reduction around ankles and forefoot as compared to last session.  Complete manual lymph drainage to bil LEs, moisturized and rebandaged.  Educated throughout session regarding lymphatic treatment, usual process and length of treatment.  Used 6cm short stretch to bil feet/ankle (roman sandal and HAS combined) to see if this helps to reduce edema in these areas.  Convinced to give treatment another week and will remeasure to see if any further reduction  in this area.  The ankles do not feel indurated so unsure how much these will go down.  Pt reports her mother also has these ankles and feels may be more hereditary than lymphatic in nature.  Pt will continue to benefit from skilled lymphatic therapy with goal for discharge to maintenance with appropriate compression stockings.    Personal Factors and Comorbidities Time since onset of injury/illness/exacerbation;Fitness    Examination-Activity Limitations Locomotion Level;Dressing    Examination-Participation Restrictions Shop    Stability/Clinical Decision Making Evolving/Moderate complexity    Rehab Potential Good    PT Frequency 3x / week    PT Duration 6 weeks    PT Treatment/Interventions Patient/family education;Therapeutic exercise;Manual lymph drainage;Compression bandaging    PT Next Visit Plan Assess comfort of adding the 6cm short stretch to ankles/forefoot next session.  discuss ordering short stretch if good reduction noted.    Measure on Wednesday.    PT Home Exercise Plan Exercises:  ankle pump, LAQ, hip ab/adduction, marching, diaphragm breathing, lymph squeeze, self massage             Patient will benefit from skilled therapeutic intervention in order to improve the following deficits and impairments:  Decreased skin integrity, Increased edema, Obesity  Visit Diagnosis: Lymphedema, not elsewhere classified  Difficulty in walking, not elsewhere classified  Chronic pain of right knee     Problem List Patient Active Problem List   Diagnosis Date Noted   Leg swelling 05/25/2021   Vitamin D deficiency 03/11/2021   Plantar fasciitis of right foot 10/13/2020   Trigger middle finger of right hand 10/13/2020   Palpitation 04/29/2020   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 [place], [place], [address] Phone: [phone]   Fax:  [phone]  Name: Kara [name] MRN: [id number] Date of Birth: 1946-01-12

## 2021-07-04 ENCOUNTER — Other Ambulatory Visit: Payer: Self-pay

## 2021-07-04 ENCOUNTER — Encounter: Payer: Self-pay | Admitting: Internal Medicine

## 2021-07-04 ENCOUNTER — Ambulatory Visit (HOSPITAL_COMMUNITY): Payer: Medicare Other | Admitting: Physical Therapy

## 2021-07-04 DIAGNOSIS — M7989 Other specified soft tissue disorders: Secondary | ICD-10-CM | POA: Diagnosis not present

## 2021-07-04 DIAGNOSIS — G8929 Other chronic pain: Secondary | ICD-10-CM | POA: Diagnosis not present

## 2021-07-04 DIAGNOSIS — M25561 Pain in right knee: Secondary | ICD-10-CM | POA: Diagnosis not present

## 2021-07-04 DIAGNOSIS — I890 Lymphedema, not elsewhere classified: Secondary | ICD-10-CM | POA: Diagnosis not present

## 2021-07-04 DIAGNOSIS — R262 Difficulty in walking, not elsewhere classified: Secondary | ICD-10-CM | POA: Diagnosis not present

## 2021-07-04 NOTE — Therapy (Addendum)
[facility] [address] [place], [place], [address] Phone: [phone]   Fax:  [phone]  Physical Therapy Treatment  Patient Details  Name: Kara [name] MRN: [id number] Date of Birth: 1946/04/27 Referring Provider (PT): Ihor Dow  PHYSICAL THERAPY DISCHARGE SUMMARY  Visits from Start of Care: 6  Current functional level related to goals / functional outcomes: See below   Remaining deficits: edema   Education / Equipment: Treatment for lymphedema   Patient agrees to discharge. Patient goals were not met. Patient is being discharged due to the patient's request.  Encounter Date: 07/04/2021   PT End of Session - 07/04/21 0925     Visit Number 6    Number of Visits 18    Date for PT Re-Evaluation 08/02/21    Authorization Type medicare/bcbs    Authorization - Visit Number 6    Authorization - Number of Visits 18    Progress Note Due on Visit 10    PT Start Time [phone]    PT Stop Time 0900    PT Time Calculation (min) 26 min    Activity Tolerance Patient tolerated treatment well    Behavior During Therapy [facility] for tasks assessed/performed             Past Medical History:  Diagnosis Date   Allergy    food allergies, drug allergies   Anemia    Anxiety    Arthritis    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  The STAT Breast cancer panel offered by [facility] includes sequencing and rearrangement analysis for the following 9 genes:  ATM, BRCA1, BRCA2, CDH1, CHEK2, PALB2, PTEN, STK11  and TP53.   The report date is August 09, 2019.   GERD (gastroesophageal reflux disease)    H/O swallowed foreign body 11/20/2018   Headache in back of head 12/31/2018   Hip pain, acute, left 03/27/2019   HLD (hyperlipidemia) 08/23/2016   Hyperlipidemia    Hypothyroid 08/14/2016   Insomnia due to stress 12/23/2018   Malignant neoplasm of upper-outer quadrant of left breast in female, estrogen receptor negative ([place]) 06/11/2019   DCIS left breast   Perimenopausal vasomotor symptoms 02/11/2016   Personal history of radiation therapy 2021   completed left breast radiation in April 2021   Postmenopausal atrophic vaginitis 11/09/2015   Pre-diabetes    no meds   Prediabetes 08/30/2017   Thyroid disease    Grave's   Vaginal discharge 01/16/2019   Weight loss, unintentional 03/27/2019    Past Surgical History:  Procedure Laterality Date   ABDOMINAL HYSTERECTOMY     bleeding. fibroids   BREAST BIOPSY Left 05/2019   left breast DCIS   BREAST LUMPECTOMY Left 06/2019   DCIS   BREAST LUMPECTOMY WITH RADIOACTIVE SEED LOCALIZATION Left 07/08/2019   Procedure: LEFT BREAST LUMPECTOMY WITH RADIOACTIVE SEED LOCALIZATION;  Surgeon: Jovita Kussmaul, MD;  Location: [facility];  Service: General;  Laterality: Left;   BREAST SURGERY N/A  Phreesia 08/30/2020   CESAREAN SECTION     CESAREAN SECTION N/A    Phreesia 08/30/2020   CHOLECYSTECTOMY     COLONOSCOPY  2011   [place]: two 3-4 mm sessile polyps, hyperplastic, sigmoid diverticula, TI appeared normal.    ESOPHAGOGASTRODUODENOSCOPY  2011   [place]: non-bleeding erosive gastropathy, normal duodenum. path: unremarkable duodenum, negative H.pylori, minimal esophagitis    TONSILLECTOMY  1975    There were no vitals filed for this visit.   Subjective Assessment - 07/04/21 0911     Subjective "Take these off of me". Pt states she is ready to be done    Currently in Pain? No/denies                [facility] PT Assessment - 07/04/21 0928        Assessment   Medical Diagnosis B LE lymphedema    Referring Provider (PT) Rutwik Patel    Onset Date/Surgical Date --   chronic   Next MD Visit not scheduled    Prior Therapy none      Precautions   Precautions --   cellulitis     Restrictions   Weight Bearing Restrictions No      Cognition   Overall Cognitive Status Within Functional Limits for tasks assessed               LYMPHEDEMA/ONCOLOGY QUESTIONNAIRE - 07/04/21 9563       Lymphedema Stage   Stage STAGE 2 SPONTANEOUSLY IRREVERSIBLE      Lymphedema Assessments   Lymphedema Assessments Lower extremities      Right Lower Extremity Lymphedema   20 cm Proximal to Suprapatella 66.5 cm   was 66.5   10 cm Proximal to Suprapatella 60.4 cm   was 60.4   At Midpatella/Popliteal Crease 51.2 cm   was 51.2   30 cm Proximal to Floor at Lateral Plantar Foot 46 cm   was 44.2   20 cm Proximal to Floor at Lateral Plantar Foot 34.5 1   was 34.9   10 cm Proximal to Floor at Lateral Malleoli 30 cm   was 34   Circumference of ankle/heel 37 cm.   was 38.3   5 cm Proximal to 1st MTP Joint 27 cm   was 26.3   Across MTP Joint 24 cm   was 25.2   Around Proximal Great Toe 9 cm   was 9.2   Other measurements compared to 2/7 evaluation      Left Lower Extremity Lymphedema   20 cm Proximal to Suprapatella 68.5 cm   was 68.5   10 cm Proximal to Suprapatella 61 cm   was 61   At Midpatella/Popliteal Crease 50.5 cm   was 50.5   30 cm Proximal to Floor at Lateral Plantar Foot 46 cm   was 44.3   20 cm Proximal to Floor at Lateral Plantar Foot 38 cm   was 36.3   10 cm Proximal to Floor at Lateral Malleoli 29 cm   wa 31.4   Circumference of ankle/heel 36.5 cm.   was 37.6   5 cm Proximal to 1st MTP Joint 26 cm   was 26   Across MTP Joint 24 cm   was 25.5   Around Proximal Great Toe 9 cm   was 9   Other measurements compared to 2/7 evaluation                        [facility] Adult PT Treatment/Exercise -  07/04/21 0001        Manual Therapy   Manual Therapy Other (comment)    Other Manual Therapy measurements                       PT Short Term Goals - 07/04/21 0916       PT SHORT TERM GOAL #1   Title Pt will be I in self massage techniques to assist in decreasing fluid volume.    Time 3    Period Weeks    Status Achieved    Target Date 07/12/21      PT SHORT TERM GOAL #2   Title PT will be I in LE exercising to assist in decreasing volume    Time 3    Period Weeks    Status Achieved      PT SHORT TERM GOAL #3   Title Pt will have obtained thigh high compression    Time 3    Period Weeks    Status Not Met               PT Long Term Goals - 07/04/21 9622       PT LONG TERM GOAL #1   Title PT to have lost 2-3 cm in measurements to decrease risk of cellulitis and allow pt to fit into her shoes.    Time 6    Period Weeks    Status Not Met    Target Date 08/02/21      PT LONG TERM GOAL #2   Title PT will have a compression pump and be using on a daily basis    Time 6    Period Weeks    Status Not Met                   Plan - 07/04/21 0917     Clinical Impression Statement Pt requests to discontinue therapy as she is not seeing any results and the wraps are hurting her legs.  Measured with reduction only noted around Lt ankle as compared to last visit however, pt has reduced well since evaluation on Feb 7.  Pt educated that reduction is seen better with short stretch bandaging if she is willing to purchase these but she is not interested at this time.  Pt educated on use of compression stockings everyday.  Measurements taken for pt to order new stockings.  Encoruaged pt to make appt with vascular MD due to complaints of pain with elevation.  Pt verbalizes she will do these things.  Pt also reports interest in obtaining a pump and will ask evaluating therapist if this had been ordered for her.  Pt self discharged at this time.    Personal Factors and Comorbidities  Time since onset of injury/illness/exacerbation;Fitness    Examination-Activity Limitations Locomotion Level;Dressing    Examination-Participation Restrictions Shop    Stability/Clinical Decision Making Evolving/Moderate complexity    Rehab Potential Good    PT Frequency 3x / week    PT Duration 6 weeks    PT Treatment/Interventions Patient/family education;Therapeutic exercise;Manual lymph drainage;Compression bandaging    PT Next Visit Plan Discharge per patient request.    PT Home Exercise Plan Exercises:  ankle pump, LAQ, hip ab/adduction, marching, diaphragm breathing, lymph squeeze, self massage             Patient will benefit from skilled therapeutic intervention in order to improve the following deficits and impairments:  Decreased skin integrity, Increased edema, Obesity  Visit Diagnosis: Lymphedema, not elsewhere classified     Problem List Patient Active Problem List   Diagnosis Date Noted   Leg swelling 05/25/2021   Vitamin D deficiency 03/11/2021   Plantar fasciitis of right foot 10/13/2020   Trigger middle finger of right hand 10/13/2020   Palpitation 04/29/2020   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 [place], [place], [address] Phone: [phone]   Fax:  [phone]  Name: CASTELLA LERNER MRN: [id number] Date of Birth: Feb 22, 1946

## 2021-07-05 NOTE — Therapy (Deleted)
[facility] [address] [place], [place], [address] Phone: [phone]   Fax:  [phone]  Physical Therapy Treatment  Patient Details  Name: Kara [name] MRN: [id number] Date of Birth: 1945-06-05 Referring Provider (PT): Ihor Dow  PHYSICAL THERAPY DISCHARGE SUMMARY  Visits from Start of Care: 6  Current functional level related to goals / functional outcomes: See below   Remaining deficits: Pt continues to have edema   Education / Equipment: That lymphedema is progressive; a compression pump has been ordered.    Patient agrees to discharge. Patient goals were not met. Patient is being discharged due to the patient's request.; Encounter Date: 07/04/2021   PT End of Session - 07/04/21 0925     Visit Number 6    Number of Visits 18    Date for PT Re-Evaluation 08/02/21    Authorization Type medicare/bcbs    Authorization - Visit Number 6    Authorization - Number of Visits 18    Progress Note Due on Visit 10    PT Start Time 3299    PT Stop Time 0900    PT Time Calculation (min) 26 min    Activity Tolerance Patient tolerated treatment well    Behavior During Therapy [facility] for tasks assessed/performed             Past Medical History:  Diagnosis Date   Allergy    food allergies, drug allergies   Anemia    Anxiety    Arthritis    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  The STAT Breast cancer panel offered by [facility] includes sequencing and rearrangement analysis for the  following 9 genes:  ATM, BRCA1, BRCA2, CDH1, CHEK2, PALB2, PTEN, STK11 and TP53.   The report date is August 09, 2019.   GERD (gastroesophageal reflux disease)    H/O swallowed foreign body 11/20/2018   Headache in back of head 12/31/2018   Hip pain, acute, left 03/27/2019   HLD (hyperlipidemia) 08/23/2016   Hyperlipidemia    Hypothyroid 08/14/2016   Insomnia due to stress 12/23/2018   Malignant neoplasm of upper-outer quadrant of left breast in female, estrogen receptor negative ([place]) 06/11/2019   DCIS left breast   Perimenopausal vasomotor symptoms 02/11/2016   Personal history of radiation therapy 2021   completed left breast radiation in April 2021   Postmenopausal atrophic vaginitis 11/09/2015   Pre-diabetes    no meds   Prediabetes 08/30/2017   Thyroid disease    Grave's   Vaginal discharge 01/16/2019   Weight loss, unintentional 03/27/2019    Past Surgical History:  Procedure Laterality Date   ABDOMINAL HYSTERECTOMY     bleeding. fibroids   BREAST BIOPSY Left 05/2019   left breast DCIS   BREAST LUMPECTOMY Left 06/2019   DCIS   BREAST LUMPECTOMY WITH RADIOACTIVE SEED LOCALIZATION Left 07/08/2019   Procedure: LEFT BREAST LUMPECTOMY WITH RADIOACTIVE SEED LOCALIZATION;  Surgeon: Jovita Kussmaul, MD;  Location: [facility];  Service: General;  Laterality: Left;   BREAST SURGERY N/A    Phreesia 08/30/2020   CESAREAN SECTION     CESAREAN SECTION N/A    Phreesia 08/30/2020   CHOLECYSTECTOMY     COLONOSCOPY  2011   [place]: two 3-4 mm sessile polyps, hyperplastic, sigmoid diverticula, TI appeared normal.    ESOPHAGOGASTRODUODENOSCOPY  2011   [place]: non-bleeding erosive gastropathy, normal duodenum. path: unremarkable duodenum, negative H.pylori, minimal esophagitis    TONSILLECTOMY  1975    There were no vitals filed for this visit.   Subjective Assessment - 07/04/21 0911     Subjective "Take these off of me". Pt states she is ready to be done    Currently in Pain?  No/denies                [facility] PT Assessment - 07/04/21 0928       Assessment   Medical Diagnosis B LE lymphedema    Referring Provider (PT) Rutwik Patel    Onset Date/Surgical Date --   chronic   Next MD Visit not scheduled    Prior Therapy none      Precautions   Precautions --   cellulitis     Restrictions   Weight Bearing Restrictions No      Cognition   Overall Cognitive Status Within Functional Limits for tasks assessed               LYMPHEDEMA/ONCOLOGY QUESTIONNAIRE - 07/04/21 4492       Lymphedema Stage   Stage STAGE 2 SPONTANEOUSLY IRREVERSIBLE      Lymphedema Assessments   Lymphedema Assessments Lower extremities      Right Lower Extremity Lymphedema   20 cm Proximal to Suprapatella 66.5 cm   was 66.5   10 cm Proximal to Suprapatella 60.4 cm   was 60.4   At Midpatella/Popliteal Crease 51.2 cm   was 51.2   30 cm Proximal to Floor at Lateral Plantar Foot 46 cm   was 44.2   20 cm Proximal to Floor at Lateral Plantar Foot 34.5 1   was 34.9   10 cm Proximal to Floor at Lateral Malleoli 30 cm   was 34   Circumference of ankle/heel 37 cm.   was 38.3   5 cm Proximal to 1st MTP Joint 27 cm   was 26.3   Across MTP Joint 24 cm   was 25.2   Around Proximal Great Toe 9 cm   was 9.2   Other measurements compared to 2/7 evaluation      Left Lower Extremity Lymphedema   20 cm Proximal to Suprapatella 68.5 cm   was 68.5   10 cm Proximal to Suprapatella 61 cm   was 61   At Midpatella/Popliteal Crease 50.5 cm   was 50.5   30 cm Proximal to Floor at Lateral Plantar Foot 46 cm   was 44.3   20 cm Proximal to Floor at Lateral Plantar Foot 38 cm   was 36.3   10 cm Proximal to Floor at Lateral Malleoli 29 cm   wa 31.4   Circumference of ankle/heel 36.5 cm.   was 37.6   5 cm Proximal to 1st MTP Joint 26 cm   was 26   Across MTP Joint 24 cm   was 25.5   Around Proximal Great Toe 9 cm   was 9   Other measurements compared to 2/7 evaluation  PT Short Term Goals - 07/04/21 0916       PT SHORT TERM GOAL #1   Title Pt will be I in self massage techniques to assist in decreasing fluid volume.    Time 3    Period Weeks    Status Achieved    Target Date 07/12/21      PT SHORT TERM GOAL #2   Title PT will be I in LE exercising to assist in decreasing volume    Time 3    Period Weeks    Status Achieved      PT SHORT TERM GOAL #3   Title Pt will have obtained thigh high compression    Time 3    Period Weeks    Status Not Met               PT Long Term Goals - 07/04/21 5009       PT LONG TERM GOAL #1   Title PT to have lost 2-3 cm in measurements to decrease risk of cellulitis and allow pt to fit into her shoes.    Time 6    Period Weeks    Status Not Met    Target Date 08/02/21      PT LONG TERM GOAL #2   Title PT will have a compression pump and be using on a daily basis    Time 6    Period Weeks    Status Not Met                   Plan - 07/04/21 0917     Clinical Impression Statement Pt requests to discontinue therapy as she is not seeing any results and the wraps are hurting her legs.  Measured with reduction only noted around Lt ankle as compared to last visit however, pt has reduced well since evaluation on Feb 7.  Pt educated that reduction is seen better with short stretch bandaging if she is willing to purchase these but she is not interested at this time.  Pt educated on use of compression stockings everyday.  Measurements taken for pt to order new stockings.  Encoruaged pt to make appt with vascular MD due to complaints of pain with elevation.  Pt verbalizes she will do these things.  Pt also reports interest in obtaining a pump and will ask evaluating therapist if this had been ordered for her.  Pt self discharged at this time.    Personal Factors and Comorbidities Time since onset of injury/illness/exacerbation;Fitness    Examination-Activity  Limitations Locomotion Level;Dressing    Examination-Participation Restrictions Shop    Stability/Clinical Decision Making Evolving/Moderate complexity    Rehab Potential Good    PT Frequency 3x / week    PT Duration 6 weeks    PT Treatment/Interventions Patient/family education;Therapeutic exercise;Manual lymph drainage;Compression bandaging    PT Next Visit Plan Discharge per patient request.    PT Home Exercise Plan Exercises:  ankle pump, LAQ, hip ab/adduction, marching, diaphragm breathing, lymph squeeze, self massage             Patient will benefit from skilled therapeutic intervention in order to improve the following deficits and impairments:  Decreased skin integrity, Increased edema, Obesity  Visit Diagnosis: Lymphedema, not elsewhere classified     Problem List Patient Active Problem List   Diagnosis Date Noted   Leg swelling 05/25/2021   Vitamin D deficiency 03/11/2021   Plantar fasciitis of right foot 10/13/2020   Trigger middle finger  of right hand 10/13/2020   Palpitation 04/29/2020   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   Shortness of breath 02/04/2020   Trochanteric bursitis of left hip 12/30/2019   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   Hypothyroidism following radioiodine therapy 03/06/2019   Prediabetes 08/30/2017   Mixed hyperlipidemia 08/23/2016   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

## 2021-07-06 ENCOUNTER — Encounter (HOSPITAL_COMMUNITY): Payer: Medicare Other | Admitting: Physical Therapy

## 2021-07-06 ENCOUNTER — Other Ambulatory Visit: Payer: Self-pay | Admitting: *Deleted

## 2021-07-06 DIAGNOSIS — M7989 Other specified soft tissue disorders: Secondary | ICD-10-CM

## 2021-07-07 DIAGNOSIS — E890 Postprocedural hypothyroidism: Secondary | ICD-10-CM | POA: Diagnosis not present

## 2021-07-07 DIAGNOSIS — E782 Mixed hyperlipidemia: Secondary | ICD-10-CM | POA: Diagnosis not present

## 2021-07-07 DIAGNOSIS — E559 Vitamin D deficiency, unspecified: Secondary | ICD-10-CM | POA: Diagnosis not present

## 2021-07-08 ENCOUNTER — Encounter (HOSPITAL_COMMUNITY): Payer: Medicare Other

## 2021-07-08 LAB — T4, FREE: Free T4: 1.52 ng/dL (ref 0.82–1.77)

## 2021-07-08 LAB — LIPID PANEL
Chol/HDL Ratio: 3.7 ratio (ref 0.0–4.4)
Cholesterol, Total: 180 mg/dL (ref 100–199)
HDL: 49 mg/dL (ref >39)
LDL Chol Calc (NIH): 117 mg/dL — ABNORMAL HIGH (ref 0–99)
Triglycerides: 74 mg/dL (ref 0–149)
VLDL Cholesterol Cal: 14 mg/dL (ref 5–40)

## 2021-07-08 LAB — VITAMIN D 25 HYDROXY (VIT D DEFICIENCY, FRACTURES): Vit D, 25-Hydroxy: 55.8 ng/mL (ref 30.0–100.0)

## 2021-07-08 LAB — TSH: TSH: 0.98 u[IU]/mL (ref 0.450–4.500)

## 2021-07-11 ENCOUNTER — Encounter (HOSPITAL_COMMUNITY): Payer: Medicare Other | Admitting: Physical Therapy

## 2021-07-13 ENCOUNTER — Encounter: Payer: Self-pay | Admitting: "Endocrinology

## 2021-07-13 ENCOUNTER — Encounter (HOSPITAL_COMMUNITY): Payer: Medicare Other | Admitting: Physical Therapy

## 2021-07-13 ENCOUNTER — Ambulatory Visit (INDEPENDENT_AMBULATORY_CARE_PROVIDER_SITE_OTHER): Payer: Medicare Other | Admitting: "Endocrinology

## 2021-07-13 VITALS — BP 121/76 | HR 73 | Ht 66.0 in | Wt 239.0 lb

## 2021-07-13 DIAGNOSIS — R7303 Prediabetes: Secondary | ICD-10-CM | POA: Diagnosis not present

## 2021-07-13 DIAGNOSIS — E890 Postprocedural hypothyroidism: Secondary | ICD-10-CM | POA: Diagnosis not present

## 2021-07-13 DIAGNOSIS — E782 Mixed hyperlipidemia: Secondary | ICD-10-CM | POA: Diagnosis not present

## 2021-07-13 MED ORDER — SYNTHROID 112 MCG PO TABS: ORAL_TABLET | ORAL | 1 refills | Status: DC

## 2021-07-13 NOTE — Patient Instructions (Signed)

## 2021-07-13 NOTE — Progress Notes (Signed)
07/13/2021, 1:00 PM                          Endocrinology follow-up note    Kara [name] is a 76 y.o.-year-old female patient being seen in follow-up for management of  RAI induced hypothyroidism referred by Lindell Spar, MD.   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  The STAT Breast cancer panel offered by [facility] includes sequencing and rearrangement analysis for the following 9 genes:  ATM, BRCA1, BRCA2, CDH1, CHEK2, PALB2, PTEN, STK11 and TP53.   The report date is August 09, 2019.   GERD (gastroesophageal reflux disease)    H/O swallowed foreign body 11/20/2018   Headache in back of head 12/31/2018   Hip pain, acute, left 03/27/2019   HLD (hyperlipidemia) 08/23/2016   Hyperlipidemia    Hypothyroid 08/14/2016   Insomnia due to stress 12/23/2018   Malignant neoplasm of upper-outer quadrant of left breast in female, estrogen receptor negative ([place]) 06/11/2019   DCIS left breast   Perimenopausal vasomotor symptoms 02/11/2016   Personal history of radiation therapy 2021   completed left breast radiation in April 2021   Postmenopausal atrophic vaginitis 11/09/2015   Pre-diabetes    no meds   Prediabetes 08/30/2017   Thyroid disease    Grave's   Vaginal discharge 01/16/2019   Weight loss, unintentional 03/27/2019     Past Surgical History:  Procedure Laterality Date   ABDOMINAL HYSTERECTOMY     bleeding. fibroids   BREAST BIOPSY Left 05/2019   left breast DCIS   BREAST LUMPECTOMY Left 06/2019   DCIS   BREAST LUMPECTOMY WITH RADIOACTIVE SEED LOCALIZATION Left 07/08/2019   Procedure: LEFT BREAST LUMPECTOMY WITH RADIOACTIVE SEED LOCALIZATION;  Surgeon: Jovita Kussmaul, MD;  Location: [facility];  Service: General;  Laterality: Left;   BREAST SURGERY N/A    Phreesia 08/30/2020   CESAREAN SECTION     CESAREAN SECTION N/A    Phreesia 08/30/2020   CHOLECYSTECTOMY     COLONOSCOPY  2011   [place]: two 3-4 mm sessile polyps, hyperplastic, sigmoid diverticula, TI appeared normal.  ESOPHAGOGASTRODUODENOSCOPY  2011   [place]: non-bleeding erosive gastropathy, normal duodenum. 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  Tobacco Use   Smoking status: Former    Types: Cigarettes    Quit date: 06/15/1998    Years since quitting: 23.0   Smokeless tobacco: Never   Tobacco comments:    quit 2002  Vaping Use   Vaping Use: Never used  Substance and Sexual Activity   Alcohol use: No   Drug use: No   Sexual activity: Not Currently    Birth control/protection: Surgical    Comment: hyst  Other Topics Concern   Not on file  Social History Narrative   Lives alone   2 grown children- one in [place] and one here   Grandchildren       Moved to [place] from [place] for aging mother who is 74 with Alzheimers      Maternal Aunt is Stage manager of the [employer] cancer call line      Enjoy: gym, time with friends       Diet: eats all food groups    Caffeine: 2 diet cokes daily    Water: 2-4 cups daily       Wears seat belt    Does not use phone while driving    Smoke and carbon Careers adviser at home   Data processing manager    No weapons    Social Determinants of Health   Financial Resource Strain: Low Risk    Difficulty of Paying Living Expenses: Not hard at all  Food Insecurity: No Food Insecurity   Worried About Charity fundraiser in the Last Year: Never true   Arboriculturist in the Last Year: Never true  Transportation Needs: No Transportation Needs   Lack of Transportation (Medical): No   Lack of Transportation (Non-Medical): No  Physical Activity: Sufficiently Active   Days of Exercise per Week: 7 days   Minutes of Exercise per Session: 30 min  Stress: No Stress Concern Present   Feeling of Stress : Not at all  Social Connections: Moderately Isolated   Frequency of Communication with Friends and Family: More than three times a week   Frequency of Social Gatherings with Friends and Family: More than three times a week   Attends Religious Services: Never   Marine scientist or Organizations: Yes   Attends Music therapist: More than 4 times per year   Marital Status: Divorced    Family History  Problem Relation Age of Onset   Breast cancer Maternal Grandmother        dx over 108   Prostate cancer Maternal Grandfather    Arthritis Mother    COPD Mother    Breast cancer Mother 44       second at age 38   Cancer Father        throat   Stroke Father    Cancer Maternal Aunt 57       [place], cervical cancer   Breast cancer Maternal Aunt        dx over 85  Breast cancer Paternal Aunt        dx under 69   Prostate cancer Maternal Uncle        dx over 86   Cancer Other        father's maternal cousin was Henreietta Lacks, Cervical   Cancer Maternal Uncle        unknown cancer   Breast cancer Paternal Aunt        dx over 66   Colon cancer Neg Hx    Allergic rhinitis Neg Hx     Angioedema Neg Hx    Asthma Neg Hx    Atopy Neg Hx    Eczema Neg Hx    Urticaria Neg Hx    Immunodeficiency Neg Hx     Outpatient Encounter Medications as of 07/13/2021  Medication Sig   acetaminophen (TYLENOL) 500 MG tablet Take 500 mg by mouth every 6 (six) hours as needed.   albuterol (VENTOLIN HFA) 108 (90 Base) MCG/ACT inhaler Inhale 1-2 puffs into the lungs every 6 (six) hours as needed for wheezing or shortness of breath.   Ascorbic Acid (VITAMIN C WITH ROSE HIPS) 500 MG tablet Take 500 mg by mouth daily.   calcium carbonate (CALCIUM 600) 600 MG TABS tablet Take 600 mg by mouth 2 (two) times daily with a meal.   cholecalciferol (VITAMIN D3) 25 MCG (1000 UNIT) tablet Take 2,000 Units by mouth daily.   EPINEPHrine 0.3 mg/0.3 mL IJ SOAJ injection Inject 0.3 mLs (0.3 mg total) into the skin as needed.   ibuprofen (ADVIL) 600 MG tablet Take 1 tablet (600 mg total) by mouth every 8 (eight) hours as needed.   SYNTHROID 112 MCG tablet TAKE 1 TABLET(112 MCG) BY MOUTH DAILY   TURMERIC CURCUMIN PO Take 1 tablet by mouth 2 (two) times daily.   [DISCONTINUED] azelastine (ASTELIN) 0.1 % nasal spray Place 2 sprays into both nostrils 2 (two) times daily. Use in each nostril as directed   [DISCONTINUED] promethazine-dextromethorphan (PROMETHAZINE-DM) 6.25-15 MG/5ML syrup Take 5 mLs by mouth 4 (four) times daily as needed for cough.   [DISCONTINUED] SYNTHROID 112 MCG tablet TAKE 1 TABLET(112 MCG) BY MOUTH DAILY   No facility-administered encounter medications on file as of 07/13/2021.    ALLERGIES: Allergies  Allergen Reactions   Coconut (Cocos Nucifera) Allergy Skin Test Anaphylaxis   Coconut Oil Anaphylaxis   Other Shortness Of Breath    Covid booster pfizer     Prednisone Hypertension    Severe headache   Naproxen Other (See Comments)    headache   VACCINATION STATUS: Immunization History  Administered Date(s) Administered   Fluad Quad(high Dose 65+) 02/11/2020, 01/20/2021   Influenza,  High Dose Seasonal PF 02/08/2018, 02/17/2019   Influenza,inj,Quad PF,6+ Mos 02/28/2017   Influenza-Unspecified 02/28/2017   PFIZER(Purple Top)SARS-COV-2 Vaccination 06/06/2019, 06/24/2019, 04/21/2020, 10/07/2020, 02/22/2021   Pneumococcal Conjugate-13 07/05/2016   Pneumococcal Polysaccharide-23 04/29/2019   Td 12/23/2018   Zoster Recombinat (Shingrix) 06/06/2021     HPI    Kara [name]  is a patient with the above medical history. she was diagnosed  with hyperthyroidism at approximate age of 34 years  which required I-131 thyroid ablation in environment.   -She was subsequently initiated on thyroid hormone supplement.  She is currently on Synthroid 112 mcg p.o. daily before breakfast.  She reports compliance and consistency with her medication.   -she   complains of weight gain and fatigue.  She has prediabetes not on medication.  She has not changed her  diet yet.  -She describes steady weight gain,. She denies tremors, palpitations. Pt denies feeling nodules in neck, hoarseness, dysphagia/odynophagia, SOB with lying down.  she denies family history of  thyroid disorders.  No family history of thyroid cancer.  Her father had head and neck tumor which did not appear to be related to thyroid cancer.  ROS:  Limited as above.   Physical Exam: BP 121/76    Pulse 73    Ht $R'5\' 6"'eH$  (1.676 m)    Wt 239 lb (108.4 kg)    BMI 38.58 kg/m  Wt Readings from Last 3 Encounters:  07/13/21 239 lb (108.4 kg)  06/14/21 239 lb 4.8 oz (108.5 kg)  05/23/21 237 lb (107.5 kg)    CMP     Component Value Date/Time   NA 142 05/23/2021 1031   K 4.9 05/23/2021 1031   CL 104 05/23/2021 1031   CO2 24 05/23/2021 1031   GLUCOSE 109 (H) 05/23/2021 1031   GLUCOSE 107 (H) 03/08/2020 0935   BUN 15 05/23/2021 1031   CREATININE 0.83 05/23/2021 1031   CREATININE 0.75 03/08/2020 0935   CALCIUM 10.3 05/23/2021 1031   PROT 6.9 10/25/2020 0954   ALBUMIN 3.9 10/25/2020 0954   AST 23 10/25/2020 0954   ALT  22 10/25/2020 0954   ALKPHOS 133 (H) 10/25/2020 0954   BILITOT 0.3 10/25/2020 0954   GFRNONAA 70 10/22/2019 0958   GFRAA 82 10/22/2019 0958    Diabetic Labs (most recent): Lab Results  Component Value Date   HGBA1C 6.4 03/11/2021   HGBA1C 5.8 (A) 09/08/2019   HGBA1C 5.9 01/15/2019   HGBA1C 5.9 01/15/2019     Lipid Panel ( most recent) Lipid Panel     Component Value Date/Time   CHOL 180 07/07/2021 0932   TRIG 74 07/07/2021 0932   HDL 49 07/07/2021 0932   CHOLHDL 3.7 07/07/2021 0932   CHOLHDL 3.0 10/22/2019 0958   VLDL 15 11/24/2016 1018   LDLCALC 117 (H) 07/07/2021 0932   LDLCALC 114 (H) 10/22/2019 0958   LABVLDL 14 07/07/2021 0932     Lab Results  Component Value Date   TSH 0.980 07/07/2021   TSH 0.62 03/02/2021   TSH 3.340 10/25/2020   TSH 1.48 03/08/2020   TSH 0.78 09/08/2019   TSH 0.34 (L) 02/27/2019   TSH 0.52 01/15/2019   TSH 0.58 08/16/2017   TSH 0.22 (L) 02/19/2017   TSH 0.28 (L) 12/15/2016   FREET4 1.52 07/07/2021   FREET4 1.5 03/02/2021   FREET4 1.56 10/25/2020   FREET4 1.4 03/08/2020   FREET4 1.3 09/08/2019   FREET4 1.5 02/27/2019       ASSESSMENT: 1. Hypothyroidism- RAI induced 2.  Prediabetes- worsening 3. Hyperlipidemia 4. Vitamin d deficiency   PLAN:   Her previsit thyroid function tests are consistent with appropriate replacement.  She is advised to continue Synthroid 112 mcg p.o. daily before breakfast.     - We discussed about the correct intake of her thyroid hormone, on empty stomach at fasting, with water, separated by at least 30 minutes from breakfast and other medications,  and separated by more than 4 hours from calcium, iron, multivitamins, acid reflux medications (PPIs). -Patient is made aware of the fact that thyroid hormone replacement is needed for life, dose to be adjusted by periodic monitoring of thyroid function tests.   -Due to absence of clinical goiter, no need for thyroid ultrasound. -Prediabetes: Her POC is  6.4% today increasing from 5.8%, she is not ready to  take medications for it.   - she acknowledges that there is a room for improvement in her food and drink choices. - Suggestion is made for her to avoid simple carbohydrates  from her diet including Cakes, Sweet Desserts, Ice Cream, Soda (diet and regular), Sweet Tea, Candies, Chips, Cookies, Store Bought Juices, Alcohol in Excess of  1-2 drinks a day, Artificial Sweeteners,  Coffee Creamer, and "Sugar-free" Products, Lemonade. This will help patient to have more stable blood glucose profile and potentially avoid unintended weight gain.  - I had a long discussion on [employer] Lifestyle nutrition and patient reading materials to the patient. This approach will help her with weight management, prediabetes, HPL, HTN.  - she is advised to increase her vitamin D to 2000 units qday.  She will have previsit labs as well as point-of-care A1c repeated here during her next visit.    She is advised to maintain close follow-up with her PCP.     I spent 30 minutes in the care of the patient today including review of labs from Thyroid Function, CMP, and other relevant labs ; imaging/biopsy records (current and previous including abstractions from other facilities); face-to-face time discussing  her lab results and symptoms, medications doses, her options of short and long term treatment based on the latest standards of care / guidelines;   and documenting the encounter.  Michele Mcalpine  participated in the discussions, expressed understanding, and voiced agreement with the above plans.  All questions were answered to her satisfaction. she is encouraged to contact clinic should she have any questions or concerns prior to her return visit.    Return in about 6 months (around 01/13/2022) for F/U with Pre-visit Labs, A1c -NV.  Glade Lloyd, MD [facility] [address] [place], [place] [address] Phone:  [phone]  Fax: [phone]   07/13/2021, 1:00 PM  This note was partially dictated with voice recognition software. Similar sounding words can be transcribed inadequately or may not  be corrected upon review.

## 2021-07-14 ENCOUNTER — Encounter (HOSPITAL_COMMUNITY): Payer: Medicare Other | Admitting: Physical Therapy

## 2021-07-21 ENCOUNTER — Other Ambulatory Visit: Payer: Self-pay

## 2021-07-21 ENCOUNTER — Ambulatory Visit (INDEPENDENT_AMBULATORY_CARE_PROVIDER_SITE_OTHER): Payer: Medicare Other | Admitting: Internal Medicine

## 2021-07-21 ENCOUNTER — Encounter: Payer: Self-pay | Admitting: Internal Medicine

## 2021-07-21 VITALS — BP 136/82 | HR 95 | Resp 18 | Ht 66.0 in | Wt 238.2 lb

## 2021-07-21 DIAGNOSIS — M7989 Other specified soft tissue disorders: Secondary | ICD-10-CM | POA: Diagnosis not present

## 2021-07-21 DIAGNOSIS — Z1211 Encounter for screening for malignant neoplasm of colon: Secondary | ICD-10-CM | POA: Diagnosis not present

## 2021-07-21 DIAGNOSIS — R1031 Right lower quadrant pain: Secondary | ICD-10-CM | POA: Diagnosis not present

## 2021-07-21 MED ORDER — CYCLOBENZAPRINE HCL 5 MG PO TABS: 5.0000 mg | ORAL_TABLET | Freq: Two times a day (BID) | ORAL | 1 refills | Status: DC | PRN

## 2021-07-21 NOTE — Patient Instructions (Signed)
Please take Flexeril as needed for muscle spasms/strain. ? ?Okay to take Aleve or Ibuprofen as needed for pain. ? ?Please continue to perform simple back exercises. ?

## 2021-07-22 NOTE — Assessment & Plan Note (Signed)
Likely related to venous insufficiency ?Leg elevation and compression socks ?Did not tolerate diuretics in the past ?Referred to PT, but had groin pain, has stopped it now ?Uses compression device at home ?

## 2021-07-22 NOTE — Progress Notes (Signed)
Acute Office Visit  Subjective:    Patient ID: Kara [name], female    DOB: 11/29/1945, 76 y.o.   MRN: [id number]  Chief Complaint  Patient presents with   Groin Pain    Pt has pain in right groin area was taking PT for lymphoma was given a massage and this started hurting pt stopped PT 07-04-21 and still has pain has taken aleve and tylenol but this is not helping     HPI Patient is in today for complaint of right groin pain for the last 2-3 weeks since she had massage at PT for leg swelling.  Her pain is worse with extension of the thigh, especially while driving.  She denies any recent injury or fall.  Denies any bulging in the groin area.  Denies any urinary or BM concern.  She has tried taking Aleve with some relief.  She has stopped PT due to her groin pain.  She has been using compression device at home for leg swelling, which has helped.  Denies any chest pain, dyspnea or palpitations.  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  The STAT Breast cancer panel offered by [facility] includes sequencing and rearrangement analysis for the following 9 genes:  ATM, BRCA1, BRCA2, CDH1, CHEK2, PALB2, PTEN, STK11 and TP53.   The report date is August 09, 2019.   GERD (gastroesophageal reflux disease)    H/O swallowed foreign body 11/20/2018   Headache in back of head 12/31/2018   Hip pain, acute, left 03/27/2019   HLD (hyperlipidemia)  08/23/2016   Hyperlipidemia    Hypothyroid 08/14/2016   Insomnia due to stress 12/23/2018   Malignant neoplasm of upper-outer quadrant of left breast in female, estrogen receptor negative ([place]) 06/11/2019   DCIS left breast   Perimenopausal vasomotor symptoms 02/11/2016   Personal history of radiation therapy 2021   completed left breast radiation in April 2021   Postmenopausal atrophic vaginitis 11/09/2015   Pre-diabetes    no meds   Prediabetes 08/30/2017   Thyroid disease    Grave's   Vaginal discharge 01/16/2019   Weight loss, unintentional 03/27/2019    Past Surgical History:  Procedure Laterality Date   ABDOMINAL HYSTERECTOMY     bleeding. fibroids   BREAST BIOPSY Left 05/2019   left breast DCIS   BREAST LUMPECTOMY Left 06/2019   DCIS   BREAST LUMPECTOMY WITH RADIOACTIVE SEED LOCALIZATION Left 07/08/2019   Procedure: LEFT BREAST LUMPECTOMY WITH RADIOACTIVE SEED LOCALIZATION;  Surgeon: Jovita Kussmaul, MD;  Location: [facility];  Service: General;  Laterality: Left;   BREAST SURGERY N/A    Phreesia 08/30/2020   CESAREAN SECTION     CESAREAN SECTION N/A    Phreesia 08/30/2020   CHOLECYSTECTOMY     COLONOSCOPY  2011   [place]: two 3-4  mm sessile polyps, hyperplastic, sigmoid diverticula, TI appeared normal.    ESOPHAGOGASTRODUODENOSCOPY  2011   [place]: non-bleeding erosive gastropathy, normal duodenum. path: unremarkable duodenum, negative H.pylori, minimal esophagitis    TONSILLECTOMY  1975    Family History  Problem Relation Age of Onset   Breast cancer Maternal Grandmother        dx over 44   Prostate cancer Maternal Grandfather    Arthritis Mother    COPD Mother    Breast cancer Mother 60       second at age 64   Cancer Father        throat   Stroke Father    Cancer Maternal Aunt 11       [name], cervical cancer   Breast cancer Maternal Aunt        dx over 35   Breast cancer Paternal Aunt        dx under 14   Prostate cancer Maternal  Uncle        dx over 10   Cancer Other        father's maternal cousin was Henreietta Lacks, Cervical   Cancer Maternal Uncle        unknown cancer   Breast cancer Paternal Aunt        dx over 36   Colon cancer Neg Hx    Allergic rhinitis Neg Hx    Angioedema Neg Hx    Asthma Neg Hx    Atopy Neg Hx    Eczema Neg Hx    Urticaria Neg Hx    Immunodeficiency Neg Hx     Social History   Socioeconomic History   Marital status: Divorced    Spouse name: Not on file   Number of children: 2   Years of education: 15   Highest education level: Not on file  Occupational History   Occupation: retired    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  Tobacco Use   Smoking status: Former    Types: Cigarettes    Quit date: 06/15/1998    Years since quitting: 23.1   Smokeless tobacco: Never   Tobacco comments:    quit 2002  Vaping Use   Vaping Use: Never used  Substance and Sexual Activity   Alcohol use: No   Drug use: No   Sexual activity: Not Currently    Birth control/protection: Surgical    Comment: hyst  Other Topics Concern   Not on file  Social History Narrative   Lives alone   2 grown children- one in [place] and one here   Grandchildren       Moved to [place] from [place] for aging mother who is 72 with Alzheimers      Maternal Aunt is Stage manager of the [employer] cancer call line      Enjoy: gym, time with friends       Diet: eats all food groups   Caffeine: 2 diet cokes daily    Water: 2-4 cups daily       Wears seat belt    Does not use phone while driving    Smoke and carbon Careers adviser at home   Data processing manager    No weapons    Social Determinants of Health   Financial Resource Strain: Low Risk    Difficulty of Paying Living Expenses: Not hard at all  Food Insecurity: No Food Insecurity   Worried About Charity fundraiser in the Last  Year: Never true   Ran Out of Food in the Last Year: Never true  Transportation Needs: No  Transportation Needs   Lack of Transportation (Medical): No   Lack of Transportation (Non-Medical): No  Physical Activity: Sufficiently Active   Days of Exercise per Week: 7 days   Minutes of Exercise per Session: 30 min  Stress: No Stress Concern Present   Feeling of Stress : Not at all  Social Connections: Moderately Isolated   Frequency of Communication with Friends and Family: More than three times a week   Frequency of Social Gatherings with Friends and Family: More than three times a week   Attends Religious Services: Never   Database administrator or Organizations: Yes   Attends Engineer, structural: More than 4 times per year   Marital Status: Divorced  Catering manager Violence: Not on file    Outpatient Medications Prior to Visit  Medication Sig Dispense Refill   acetaminophen (TYLENOL) 500 MG tablet Take 500 mg by mouth every 6 (six) hours as needed.     albuterol (VENTOLIN HFA) 108 (90 Base) MCG/ACT inhaler Inhale 1-2 puffs into the lungs every 6 (six) hours as needed for wheezing or shortness of breath. 8 g 1   Ascorbic Acid (VITAMIN C WITH ROSE HIPS) 500 MG tablet Take 500 mg by mouth daily.     calcium carbonate (OS-CAL) 600 MG TABS tablet Take 600 mg by mouth 2 (two) times daily with a meal.     cholecalciferol (VITAMIN D3) 25 MCG (1000 UNIT) tablet Take 2,000 Units by mouth daily.     EPINEPHrine 0.3 mg/0.3 mL IJ SOAJ injection Inject 0.3 mLs (0.3 mg total) into the skin as needed. 2 each 2   ibuprofen (ADVIL) 600 MG tablet Take 1 tablet (600 mg total) by mouth every 8 (eight) hours as needed. 30 tablet 0   SYNTHROID 112 MCG tablet TAKE 1 TABLET(112 MCG) BY MOUTH DAILY 90 tablet 1   TURMERIC CURCUMIN PO Take 1 tablet by mouth 2 (two) times daily.     No facility-administered medications prior to visit.    Allergies  Allergen Reactions   Coconut (Cocos Nucifera) Allergy Skin Test Anaphylaxis   Coconut Oil Anaphylaxis   Other Shortness Of Breath    Covid  booster pfizer     Prednisone Hypertension    Severe headache   Naproxen Other (See Comments)    headache    Review of Systems  Constitutional:  Negative for chills and fever.  HENT:  Negative for congestion, sinus pressure, sinus pain and sore throat.   Eyes:  Negative for pain and discharge.  Respiratory:  Negative for cough and shortness of breath.   Cardiovascular:  Positive for leg swelling. Negative for chest pain and palpitations.  Gastrointestinal:  Negative for abdominal pain, constipation, diarrhea, nausea and vomiting.  Endocrine: Negative for polydipsia and polyuria.  Genitourinary:  Negative for dysuria and hematuria.  Musculoskeletal:  Positive for arthralgias. Negative for neck pain and neck stiffness.       Right groin pain  Skin:  Negative for rash.  Neurological:  Negative for dizziness and weakness.  Psychiatric/Behavioral:  Negative for agitation and behavioral problems.       Objective:    Physical Exam Vitals reviewed.  Constitutional:      General: She is not in acute distress.    Appearance: She is obese. She is not diaphoretic.  HENT:     Head: Normocephalic and atraumatic.  Nose: Nose normal. No congestion.     Mouth/Throat:     Mouth: Mucous membranes are moist.     Pharynx: No posterior oropharyngeal erythema.  Eyes:     General: No scleral icterus.    Extraocular Movements: Extraocular movements intact.  Cardiovascular:     Rate and Rhythm: Normal rate and regular rhythm.     Pulses: Normal pulses.     Heart sounds: Normal heart sounds. No murmur heard. Pulmonary:     Breath sounds: Normal breath sounds. No wheezing or rales.  Musculoskeletal:     Cervical back: Neck supple. No tenderness.     Right lower leg: Edema (1+) present.     Left lower leg: Edema (1+) present.     Comments: SLR negative  Skin:    General: Skin is warm.     Findings: No rash.  Neurological:     General: No focal deficit present.     Mental Status: She is  alert and oriented to person, place, and time.     Sensory: No sensory deficit.     Motor: No weakness.  Psychiatric:        Mood and Affect: Mood normal.        Behavior: Behavior normal.    BP 136/82 (BP Location: Left Arm, Patient Position: Sitting, Cuff Size: Normal)    Pulse 95    Resp 18    Ht $R'5\' 6"'Ai$  (1.676 m)    Wt 238 lb 3.2 oz (108 kg)    SpO2 100%    BMI 38.45 kg/m  Wt Readings from Last 3 Encounters:  07/21/21 238 lb 3.2 oz (108 kg)  07/13/21 239 lb (108.4 kg)  06/14/21 239 lb 4.8 oz (108.5 kg)        Assessment & Plan:   Problem List Items Addressed This Visit    Visit Diagnoses     Right groin pain    -  Primary Likely triggered during PT session, could be muscle strain of psoas muscle Tylenol as needed Flexeril as needed for muscle spasms Advised to perform simple back exercises/stretching exercises at home    Relevant Medications   cyclobenzaprine (FLEXERIL) 5 MG tablet   Special screening for malignant neoplasms, colon       Relevant Orders   Ambulatory referral to Gastroenterology        Meds ordered this encounter  Medications   cyclobenzaprine (FLEXERIL) 5 MG tablet    Sig: Take 1 tablet (5 mg total) by mouth 2 (two) times daily as needed for muscle spasms.    Dispense:  30 tablet    Refill:  1     Leonore Frankson Keith Rake, MD

## 2021-07-25 ENCOUNTER — Encounter: Payer: Self-pay | Admitting: Internal Medicine

## 2021-08-12 ENCOUNTER — Other Ambulatory Visit: Payer: Self-pay | Admitting: *Deleted

## 2021-08-12 DIAGNOSIS — M7989 Other specified soft tissue disorders: Secondary | ICD-10-CM

## 2021-08-17 ENCOUNTER — Ambulatory Visit (INDEPENDENT_AMBULATORY_CARE_PROVIDER_SITE_OTHER): Payer: Medicare Other | Admitting: Vascular Surgery

## 2021-08-17 ENCOUNTER — Encounter: Payer: Self-pay | Admitting: Vascular Surgery

## 2021-08-17 ENCOUNTER — Ambulatory Visit (HOSPITAL_COMMUNITY)
Admission: RE | Admit: 2021-08-17 | Discharge: 2021-08-17 | Disposition: A | Discharge status: Home / Self Care | Payer: Medicare Other | Source: Ambulatory Visit | Attending: Vascular Surgery | Admitting: Vascular Surgery

## 2021-08-17 VITALS — BP 96/57 | HR 75 | Temp 97.7°F | Resp 20 | Ht 66.0 in | Wt 239.0 lb

## 2021-08-17 DIAGNOSIS — M7989 Other specified soft tissue disorders: Secondary | ICD-10-CM | POA: Insufficient documentation

## 2021-08-17 NOTE — Progress Notes (Signed)
? ?Patient ID: Kara [name], female   DOB: [birth year], 76 y.o.   MRN: [id number] ? ?Reason for Consult: New Patient (Initial Visit) ?  ?Referred by Lindell Spar, MD ? ?Subjective:  ?   ?HPI: ? ?Kara [name] is a 76 y.o. female without previous vascular disease.  She has had a long history of bilateral lower extremity swelling and is even undergone physical therapy for this in the past.  She does wear knee-high stockings bilaterally.  She has no skin changes no tissue loss or ulceration.  She does have spider veins on the right greater than the left states that her mother also had these.  She denies any personal or family history of DVT.  She is never had any lower extremity surgeries. ? ?Past Medical History:  ?Diagnosis Date  ? Allergy   ? food allergies, drug allergies  ? Anemia   ? Anxiety   ? Arthritis   ? Breast cancer ([place]) 05/2019  ? left breast DCIS  ? Breast pain, left 02/25/2018  ? Cataract   ? Change in bowel function 04/09/2018  ? Chronic pain of left knee 09/03/2019  ? Colon polyps 08/23/2016  ? COVID-19 virus infection 01/14/2020  ? Dyspepsia 09/29/2016  ? Family history of breast cancer   ? Family history of cancer 08/23/2016  ? Aunt is [employer]  ? Family history of prostate cancer   ? Genetic testing 08/11/2019  ? Negative genetic testing on the [facility] 9-gene STAT panel.  The STAT Breast cancer panel offered by [facility] includes sequencing and rearrangement analysis for the following 9 genes:  ATM, BRCA1, BRCA2, CDH1, CHEK2, PALB2, PTEN, STK11 and TP53.   The report date is August 09, 2019.  ? GERD (gastroesophageal reflux disease)   ? H/O swallowed foreign body 11/20/2018  ? Headache in back of head 12/31/2018  ? Hip pain, acute, left 03/27/2019  ? HLD (hyperlipidemia) 08/23/2016  ? Hyperlipidemia   ? Hypothyroid 08/14/2016  ? Insomnia due to stress 12/23/2018  ? Malignant neoplasm of upper-outer quadrant of left breast in female, estrogen receptor negative ([place]) 06/11/2019  ? DCIS left breast  ?  Perimenopausal vasomotor symptoms 02/11/2016  ? Personal history of radiation therapy 2021  ? completed left breast radiation in April 2021  ? Postmenopausal atrophic vaginitis 11/09/2015  ? Pre-diabetes   ? no meds  ? Prediabetes 08/30/2017  ? Thyroid disease   ? Grave's  ? Vaginal discharge 01/16/2019  ? Weight loss, unintentional 03/27/2019  ? ?Family History  ?Problem Relation Age of Onset  ? Breast cancer Maternal Grandmother   ?     dx over 76  ? Prostate cancer Maternal Grandfather   ? Arthritis Mother   ? COPD Mother   ? Breast cancer Mother 25  ?     second at age 51  ? Cancer Father   ?     throat  ? Stroke Father   ? Cancer Maternal Aunt 30  ?     [name], cervical cancer  ? Breast cancer Maternal Aunt   ?     dx over 56  ? Breast cancer Paternal Aunt   ?     dx under 35  ? Prostate cancer Maternal Uncle   ?     dx over 50  ? Cancer Other   ?     father's maternal cousin was Henreietta Lacks, Cervical  ? Cancer Maternal Uncle   ?     unknown  cancer  ? Breast cancer Paternal Aunt   ?     dx over 93  ? Colon cancer Neg Hx   ? Allergic rhinitis Neg Hx   ? Angioedema Neg Hx   ? Asthma Neg Hx   ? Atopy Neg Hx   ? Eczema Neg Hx   ? Urticaria Neg Hx   ? Immunodeficiency Neg Hx   ? ?Past Surgical History:  ?Procedure Laterality Date  ? ABDOMINAL HYSTERECTOMY    ? bleeding. fibroids  ? BREAST BIOPSY Left 05/2019  ? left breast DCIS  ? BREAST LUMPECTOMY Left 06/2019  ? DCIS  ? BREAST LUMPECTOMY WITH RADIOACTIVE SEED LOCALIZATION Left 07/08/2019  ? Procedure: LEFT BREAST LUMPECTOMY WITH RADIOACTIVE SEED LOCALIZATION;  Surgeon: Jovita Kussmaul, MD;  Location: [facility];  Service: General;  Laterality: Left;  ? BREAST SURGERY N/A   ? [employer]reesia 08/30/2020  ? CESAREAN SECTION    ? CESAREAN SECTION N/A   ? [employer]reesia 08/30/2020  ? CHOLECYSTECTOMY    ? COLONOSCOPY  2011  ? [place]: two 3-4 mm sessile polyps, hyperplastic, sigmoid diverticula, TI appeared normal.   ? ESOPHAGOGASTRODUODENOSCOPY  2011  ?  [place]: non-bleeding erosive gastropathy, normal duodenum. path: unremarkable duodenum, negative H.pylori, minimal esophagitis   ? TONSILLECTOMY  1975  ? ? ?Short Social History:  ?Social History  ? ?Tobacco Use  ? Smoking status: Former  ?  Types: Cigarettes  ?  Quit date: 06/15/1998  ?  Years since quitting: 23.1  ? Smokeless tobacco: Never  ? Tobacco comments:  ?  quit 2002  ?Substance Use Topics  ? Alcohol use: No  ? ? ?Allergies  ?Allergen Reactions  ? Coconut (Cocos Nucifera) Allergy Skin Test Anaphylaxis  ? Coconut Oil Anaphylaxis  ? Other Shortness Of Breath  ?  Covid booster pfizer  ?  ? Prednisone Hypertension  ?  Severe headache  ? Naproxen Other (See Comments)  ?  headache  ? ? ?Current Outpatient Medications  ?Medication Sig Dispense Refill  ? acetaminophen (TYLENOL) 500 MG tablet Take 500 mg by mouth every 6 (six) hours as needed.    ? albuterol (VENTOLIN HFA) 108 (90 Base) MCG/ACT inhaler Inhale 1-2 puffs into the lungs every 6 (six) hours as needed for wheezing or shortness of breath. 8 g 1  ? Ascorbic Acid (VITAMIN C WITH ROSE HIPS) 500 MG tablet Take 500 mg by mouth daily.    ? calcium carbonate (OS-CAL) 600 MG TABS tablet Take 600 mg by mouth 2 (two) times daily with a meal.    ? cholecalciferol (VITAMIN D3) 25 MCG (1000 UNIT) tablet Take 2,000 Units by mouth daily.    ? EPINEPHrine 0.3 mg/0.3 mL IJ SOAJ injection Inject 0.3 mLs (0.3 mg total) into the skin as needed. 2 each 2  ? ibuprofen (ADVIL) 600 MG tablet Take 1 tablet (600 mg total) by mouth every 8 (eight) hours as needed. 30 tablet 0  ? SYNTHROID 112 MCG tablet TAKE 1 TABLET(112 MCG) BY MOUTH DAILY 90 tablet 1  ? TURMERIC CURCUMIN PO Take 1 tablet by mouth 2 (two) times daily.    ? ?No current facility-administered medications for this visit.  ? ? ?Review of Systems  ?Constitutional:  Constitutional negative. ?HENT: HENT negative.  ?Eyes: Eyes negative.  ?Respiratory: Respiratory negative.  ?Cardiovascular: Positive for leg swelling.   ?GI: Gastrointestinal negative.  ?Musculoskeletal: Positive for leg pain.  ?Neurological: Neurological negative. ?Hematologic: Hematologic/lymphatic negative.  ?Psychiatric: Psychiatric negative.   ? ?   ?  Objective:  ?Objective  ? ?Vitals:  ? 08/17/21 1045  ?BP: (!) 96/57  ?Pulse: 75  ?Resp: 20  ?Temp: 97.7 ?F (36.5 ?C)  ?SpO2: 98%  ?Weight: 239 lb (108.4 kg)  ?Height: 5\' 6"  (1.676 m)  ? ?Body mass index is 38.58 kg/m?. ? ?Physical Exam ?HENT:  ?   Head: Normocephalic.  ?   Nose: Nose normal.  ?Eyes:  ?   Pupils: Pupils are equal, round, and reactive to light.  ?Cardiovascular:  ?   Rate and Rhythm: Normal rate.  ?   Pulses: Normal pulses.  ?Pulmonary:  ?   Effort: Pulmonary effort is normal.  ?Abdominal:  ?   General: Abdomen is flat.  ?   Palpations: Abdomen is soft. There is no mass.  ?Musculoskeletal:     ?   General: Normal range of motion.  ?   Cervical back: Normal range of motion.  ?   Right lower leg: Edema present.  ?   Left lower leg: Edema present.  ?Skin: ?   General: Skin is warm and dry.  ?   Capillary Refill: Capillary refill takes less than 2 seconds.  ?   Comments: Mild burden of telangiectasias right greater than left lower extremity  ?Neurological:  ?   General: No focal deficit present.  ?   Mental Status: She is alert.  ?Psychiatric:     ?   Mood and Affect: Mood normal.     ?   Thought Content: Thought content normal.     ?   Judgment: Judgment normal.  ? ? ?Data: ?Venous Reflux Times  ?+---------------+---------+------+----------+-----------+------------------  ?---+  ?RIGHT          Reflux NoReflux  Reflux   Diameter  Comments            ?     ?                         Yes     Time       cms                        ?     ?+---------------+---------+------+----------+-----------+------------------  ?---+  ?CFV            no                                                      ?     ?+---------------+---------+------+----------+-----------+------------------  ?---+  ?FV  prox        no                                                      ?     ?+---------------+---------+------+----------+-----------+------------------  ?---+  ?FV mid         no                                                      ?

## 2021-09-09 ENCOUNTER — Encounter: Payer: Self-pay | Admitting: Internal Medicine

## 2021-09-12 ENCOUNTER — Other Ambulatory Visit: Payer: Self-pay | Admitting: *Deleted

## 2021-09-12 MED ORDER — EPINEPHRINE 0.3 MG/0.3ML IJ SOAJ
0.3000 mg | INTRAMUSCULAR | 2 refills | Status: DC | PRN
Start: 2021-09-12 — End: 2022-04-05

## 2021-09-21 ENCOUNTER — Telehealth: Payer: Self-pay | Admitting: *Deleted

## 2021-09-21 NOTE — Telephone Encounter (Signed)
Pt LMOM that she needed to reschedule her NV for 09/26/2021. She wanted to reschedule, but do you have any openings?  [phone] ?

## 2021-09-22 NOTE — Telephone Encounter (Signed)
Spoke to pt.  Her mother passed.  She rescheduled nurse visit by phone to 10/06/2021 at 4:00.  Confirmed phone number. ?

## 2021-09-26 ENCOUNTER — Ambulatory Visit: Payer: Medicare Other

## 2021-10-04 DIAGNOSIS — D0512 Intraductal carcinoma in situ of left breast: Secondary | ICD-10-CM | POA: Diagnosis not present

## 2021-10-06 ENCOUNTER — Ambulatory Visit (INDEPENDENT_AMBULATORY_CARE_PROVIDER_SITE_OTHER): Payer: Self-pay | Admitting: *Deleted

## 2021-10-06 ENCOUNTER — Other Ambulatory Visit: Payer: Self-pay | Admitting: General Surgery

## 2021-10-06 VITALS — Ht 66.0 in | Wt 230.0 lb

## 2021-10-06 DIAGNOSIS — D0512 Intraductal carcinoma in situ of left breast: Secondary | ICD-10-CM

## 2021-10-06 DIAGNOSIS — Z1211 Encounter for screening for malignant neoplasm of colon: Secondary | ICD-10-CM

## 2021-10-06 NOTE — Progress Notes (Addendum)
Gastroenterology Pre-Procedure Review  Request Date: 10/06/2021 Requesting Physician: Dr. Ihor Dow @ [facility], Last TCS done 09/06/2009 by Dr. Merri Ray, hyperplastic polyps  PATIENT REVIEW QUESTIONS: The patient responded to the following health history questions as indicated:    1. Diabetes Melitis: no, pre-diabetic 2. Joint replacements in the past 12 months: no 3. Major health problems in the past 3 months: no 4. Has an artificial valve or MVP: no 5. Has a defibrillator: no 6. Has been advised in past to take antibiotics in advance of a procedure like teeth cleaning: no 7. Family history of colon cancer: no  8. Alcohol Use: no 9. Illicit drug Use: no 10. History of sleep apnea: no  11. History of coronary artery or other vascular stents placed within the last 12 months: no 12. History of any prior anesthesia complications: no 13. Body mass index is 37.12 kg/m.    MEDICATIONS & ALLERGIES:    Patient reports the following regarding taking any blood thinners:   Plavix? no Aspirin? no Coumadin? no Brilinta? no Xarelto? no Eliquis? no Pradaxa? no Savaysa? no Effient? no  Patient confirms/reports the following medications:  Current Outpatient Medications  Medication Sig Dispense Refill   acetaminophen (TYLENOL) 500 MG tablet Take 500 mg by mouth as needed.     albuterol (VENTOLIN HFA) 108 (90 Base) MCG/ACT inhaler Inhale 1-2 puffs into the lungs every 6 (six) hours as needed for wheezing or shortness of breath. 8 g 1   Ascorbic Acid (VITAMIN C WITH ROSE HIPS) 500 MG tablet Take 500 mg by mouth daily.     Calcium Carb-Cholecalciferol (CALCIUM 600 + D PO) Take by mouth daily at 6 (six) AM.     cholecalciferol (VITAMIN D3) 25 MCG (1000 UNIT) tablet Take 2,000 Units by mouth daily.     EPINEPHrine 0.3 mg/0.3 mL IJ SOAJ injection Inject 0.3 mg into the skin as needed. 2 each 2   ibuprofen (ADVIL) 600 MG tablet Take 1 tablet (600 mg total) by mouth every 8  (eight) hours as needed. (Patient taking differently: Take 600 mg by mouth as needed.) 30 tablet 0   SYNTHROID 112 MCG tablet TAKE 1 TABLET(112 MCG) BY MOUTH DAILY 90 tablet 1   No current facility-administered medications for this visit.    Patient confirms/reports the following allergies:  Allergies  Allergen Reactions   Coconut (Cocos Nucifera) Anaphylaxis   Cocos Nucifera Anaphylaxis   Other Shortness Of Breath    Covid booster pfizer     Prednisone Hypertension    Severe headache   Naproxen Other (See Comments)    headache    No orders of the defined types were placed in this encounter.   AUTHORIZATION INFORMATION Primary Insurance: Medicare,  ID #: [id number] Pre-Cert / Auth required: No, not required  Secondary Insurance: [place]  ID #: [id number],  Group #: [id number] Pre-Cert / Auth required: No, not required  SCHEDULE INFORMATION: Procedure has been scheduled as follows:  Date: 11/22/2021, Time: 9:00 Location: [facility] with Dr. Abbey Chatters  This Gastroenterology Pre-Precedure Review Form is being routed to the following provider(s): Venetia Night, NP

## 2021-10-11 ENCOUNTER — Encounter: Payer: Self-pay | Admitting: *Deleted

## 2021-10-11 MED ORDER — PEG 3350-KCL-NA BICARB-NACL 420 G PO SOLR: 4000.0000 mL | Freq: Once | ORAL | 0 refills | Status: AC

## 2021-10-11 NOTE — Progress Notes (Signed)
Spoke to pt.  Scheduled procedure for 11/22/2021 at 9:00, arrival 7:30 at [facility].  Reviewed prep instructions with pt by phone.  Pt made aware that I sent her RX to her pharmacy.  She was made aware that she needs to purchase an enema and dulcolax.  Confirmed mailing address and mailed instructions.

## 2021-10-11 NOTE — Progress Notes (Signed)
ASA 2. Okay to schedule. 

## 2021-10-11 NOTE — Addendum Note (Signed)
Addended by: Metro Kung on: 10/11/2021 03:58 PM   Modules accepted: Orders

## 2021-10-12 ENCOUNTER — Ambulatory Visit: Payer: Medicare Other

## 2021-10-14 ENCOUNTER — Other Ambulatory Visit: Payer: Self-pay | Admitting: Internal Medicine

## 2021-10-14 DIAGNOSIS — R0602 Shortness of breath: Secondary | ICD-10-CM

## 2021-10-18 ENCOUNTER — Ambulatory Visit
Admission: RE | Admit: 2021-10-18 | Discharge: 2021-10-18 | Disposition: A | Discharge status: Home / Self Care | Payer: Medicare Other | Source: Ambulatory Visit | Attending: General Surgery | Admitting: General Surgery

## 2021-10-18 DIAGNOSIS — Z853 Personal history of malignant neoplasm of breast: Secondary | ICD-10-CM | POA: Diagnosis not present

## 2021-10-18 DIAGNOSIS — N644 Mastodynia: Secondary | ICD-10-CM | POA: Diagnosis not present

## 2021-10-18 DIAGNOSIS — D0512 Intraductal carcinoma in situ of left breast: Secondary | ICD-10-CM

## 2021-10-18 DIAGNOSIS — Z86000 Personal history of in-situ neoplasm of breast: Secondary | ICD-10-CM | POA: Diagnosis not present

## 2021-10-31 ENCOUNTER — Ambulatory Visit (INDEPENDENT_AMBULATORY_CARE_PROVIDER_SITE_OTHER): Payer: Medicare Other

## 2021-10-31 ENCOUNTER — Encounter: Payer: Self-pay | Admitting: Internal Medicine

## 2021-10-31 DIAGNOSIS — Z0000 Encounter for general adult medical examination without abnormal findings: Secondary | ICD-10-CM | POA: Diagnosis not present

## 2021-10-31 NOTE — Patient Instructions (Signed)
  Ms. Schubring , Thank you for taking time to come for your Medicare Wellness Visit. I appreciate your ongoing commitment to your health goals. Please review the following plan we discussed and let me know if I can assist you in the future.   These are the goals we discussed:  Goals      Exercise 150 minutes per week (moderate activity)     HEMOGLOBIN A1C < 7.0     LDL CALC < 100     Patient Stated     Patient states that she currently does not have any goals set      Weight (lb) < 200 lb (90.7 kg)        This is a list of the screening recommended for you and due dates:  Health Maintenance  Topic Date Due   Complete foot exam   Never done   Colon Cancer Screening  09/07/2019   COVID-19 Vaccine (6 - Booster for Pfizer series) 04/19/2021   Zoster (Shingles) Vaccine (2 of 2) 08/01/2021   Hemoglobin A1C  09/09/2021   Eye exam for diabetics  11/01/2021   Flu Shot  12/13/2021   Urine Protein Check  05/23/2022   Mammogram  05/28/2023   Tetanus Vaccine  12/22/2028   Pneumonia Vaccine  Completed   DEXA scan (bone density measurement)  Completed   Hepatitis C Screening: USPSTF Recommendation to screen - Ages 16-79 yo.  Completed   HPV Vaccine  Aged Out

## 2021-10-31 NOTE — Progress Notes (Signed)
Subjective:   Kara [name] is a 76 y.o. female who presents for Medicare Annual (Subsequent) preventive examination. I connected with  Michele Mcalpine on 10/31/21 by a audio enabled telemedicine application and verified that I am speaking with the correct person using two identifiers.  Patient Location: Home  Provider Location: Office/Clinic  I discussed the limitations of evaluation and management by telemedicine. The patient expressed understanding and agreed to proceed.  Review of Systems           Objective:    There were no vitals filed for this visit. There is no height or weight on file to calculate BMI.     06/21/2021    4:23 PM 10/06/2020    3:12 PM 02/19/2020    9:58 AM 11/26/2019    3:32 PM 10/23/2019    9:51 AM 09/16/2019    2:02 PM 07/28/2019   12:48 PM  Advanced Directives  Does Patient Have a Medical Advance Directive? No Yes Yes Yes Yes Yes Yes  Type of Social research officer, government;Living will Healthcare Power of [place];Living will [place];Living will  [place];Living will  Does patient want to make changes to medical advance directive?  No - Patient declined  Yes (MAU/Ambulatory/Procedural Areas - Information given) No - Patient declined No - Patient declined   Copy of [place] in Chart?  No - copy requested  No - copy requested   No - copy requested  Would patient like information on creating a medical advance directive? No - Patient declined          Current Medications (verified) Outpatient Encounter Medications as of 10/31/2021  Medication Sig   acetaminophen (TYLENOL) 500 MG tablet Take 500 mg by mouth as needed.   albuterol (VENTOLIN HFA) 108 (90 Base) MCG/ACT inhaler INHALE 1 TO 2 PUFFS INTO THE LUNGS EVERY 6 HOURS AS NEEDED FOR WHEEZING OR SHORTNESS OF BREATH   Ascorbic Acid (VITAMIN C WITH ROSE HIPS) 500 MG tablet Take 500 mg by mouth daily.    Calcium Carb-Cholecalciferol (CALCIUM 600 + D PO) Take by mouth daily at 6 (six) AM.   cholecalciferol (VITAMIN D3) 25 MCG (1000 UNIT) tablet Take 2,000 Units by mouth daily.   EPINEPHrine 0.3 mg/0.3 mL IJ SOAJ injection Inject 0.3 mg into the skin as needed.   ibuprofen (ADVIL) 600 MG tablet Take 1 tablet (600 mg total) by mouth every 8 (eight) hours as needed. (Patient taking differently: Take 600 mg by mouth as needed.)   SYNTHROID 112 MCG tablet TAKE 1 TABLET(112 MCG) BY MOUTH DAILY   No facility-administered encounter medications on file as of 10/31/2021.    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  The STAT Breast cancer panel offered by [facility] includes sequencing and rearrangement analysis for the following 9 genes:  ATM, BRCA1, BRCA2, CDH1, CHEK2, PALB2, PTEN, STK11 and TP53.   The report date is August 09, 2019.   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 fibroids   BREAST BIOPSY Left 05/2019   left breast DCIS   BREAST LUMPECTOMY Left 06/2019   DCIS   BREAST LUMPECTOMY WITH RADIOACTIVE SEED LOCALIZATION Left 07/08/2019   Procedure: LEFT BREAST LUMPECTOMY WITH RADIOACTIVE SEED LOCALIZATION;  Surgeon: Griselda Miner, MD;  Location: [facility];  Service: General;  Laterality: Left;   BREAST SURGERY N/A    Phreesia 08/30/2020   CESAREAN SECTION     CESAREAN SECTION N/A    Phreesia 08/30/2020   CHOLECYSTECTOMY     COLONOSCOPY  2011   [place]: two 3-4 mm sessile polyps, hyperplastic, sigmoid diverticula, TI appeared normal.    ESOPHAGOGASTRODUODENOSCOPY  2011   [place]: non-bleeding erosive gastropathy, normal duodenum. path: unremarkable duodenum, negative H.pylori, minimal esophagitis    TONSILLECTOMY  1975   Family History  Problem Relation Age of Onset   Breast cancer Maternal Grandmother        dx over 53   Prostate cancer Maternal Grandfather    Arthritis Mother    COPD Mother    Breast cancer Mother 82       second at age 66   Cancer Father        throat   Stroke Father    Cancer Maternal Aunt 62       [name], cervical cancer   Breast cancer Maternal Aunt        dx over 30   Breast cancer Paternal Aunt        dx under 50   Prostate cancer Maternal Uncle        dx over 78   Cancer Other        father's maternal cousin was Henreietta Lacks, Cervical   Cancer Maternal Uncle        unknown cancer   Breast cancer Paternal Aunt        dx over 54   Colon cancer Neg Hx    Allergic rhinitis  Neg Hx    Angioedema Neg Hx    Asthma Neg Hx    Atopy Neg Hx    Eczema Neg Hx    Urticaria Neg Hx    Immunodeficiency Neg Hx    Social History   Socioeconomic History   Marital status: Divorced    Spouse name: Not on file   Number of children: 2  Years of education: 15   Highest education level: Not on file  Occupational History   Occupation: retired    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  Tobacco Use   Smoking status: Former    Types: Cigarettes    Quit date: 06/15/1998    Years since quitting: 23.3   Smokeless tobacco: Never   Tobacco comments:    quit 2002  Vaping Use   Vaping Use: Never used  Substance and Sexual Activity   Alcohol use: No   Drug use: No   Sexual activity: Not Currently    Birth control/protection: Surgical    Comment: hyst  Other Topics Concern   Not on file  Social History Narrative   Lives alone   2 grown children- one in [place] and one here   Grandchildren       Moved to [employer] from [place] for aging mother who is 71 with Alzheimers      Maternal Aunt is Stage manager of the [employer] cancer call line      Enjoy: gym, time with friends       Diet: eats all food groups   Caffeine: 2 diet cokes daily    Water: 2-4 cups daily       Wears seat belt    Does not use phone while driving    Smoke and carbon Careers adviser at home   [employer]    No weapons    Social Determinants of Health   Financial Resource Strain: Low Risk  (10/06/2020)   Overall Financial Resource Strain (CARDIA)    Difficulty of Paying Living Expenses: Not hard at all  Food Insecurity: No Food Insecurity (10/06/2020)   Hunger Vital Sign    Worried About Running Out of Food in the Last Year: Never true    [place] in the Last Year: Never true  Transportation Needs: No Transportation Needs (10/06/2020)   PRAPARE - Hydrologist (Medical): No    Lack of Transportation (Non-Medical): No  Physical  Activity: Sufficiently Active (10/06/2020)   Exercise Vital Sign    Days of Exercise per Week: 7 days    Minutes of Exercise per Session: 30 min  Stress: No Stress Concern Present (10/06/2020)   [place]    Feeling of Stress : Not at all  Social Connections: Moderately Isolated (10/06/2020)   Social Connection and Isolation Panel [NHANES]    Frequency of Communication with Friends and Family: More than three times a week    Frequency of Social Gatherings with Friends and Family: More than three times a week    Attends Religious Services: Never    Marine scientist or Organizations: Yes    Attends Music therapist: More than 4 times per year    Marital Status: Divorced    Tobacco Counseling Counseling given: Not Answered Tobacco comments: quit 2002   Clinical Intake:                 Diabetic?no         Activities of Daily Living     No data to display          Patient Care Team: Lindell Spar, MD as PCP - General (Internal Medicine) Gala Romney, Cristopher Estimable, MD as Consulting Physician (Gastroenterology) Jovita Kussmaul, MD as Consulting Physician (General Surgery) Nicholas Lose, MD as Consulting Physician (Hematology and Oncology)  Gery Pray, MD as Consulting Physician (Radiation Oncology) Eloise Harman, DO as Consulting Physician (Gastroenterology)  Indicate any recent Medical Services you may have received from other than [facility] providers in the past year (date may be approximate).     Assessment:   This is a routine wellness examination for Kara [name].  Hearing/Vision screen No results found.  Dietary issues and exercise activities discussed:     Goals Addressed   None    Depression Screen    07/21/2021    8:57 AM 05/23/2021    9:37 AM 04/21/2021    8:04 AM 04/04/2021   11:06 AM 01/20/2021    4:13 PM 10/13/2020    9:48 AM 10/06/2020    3:15 PM  PHQ 2/9 Scores  PHQ  - 2 Score 0 0 0 0 0 0 0    Fall Risk    07/21/2021    8:56 AM 05/23/2021    9:36 AM 04/21/2021    8:04 AM 04/04/2021   11:06 AM 01/20/2021    4:13 PM  Fall Risk   Falls in the past year? 0 0 0 0 0  Number falls in past yr: 0 0 0 0 0  Injury with Fall? 0 0 0 0 0  Risk for fall due to : No Fall Risks No Fall Risks  No Fall Risks No Fall Risks  Follow up Falls evaluation completed Falls evaluation completed  Falls evaluation completed Falls evaluation completed    FALL RISK PREVENTION PERTAINING TO THE HOME:  Any stairs in or around the home? Yes  If so, are there any without handrails? No  Home free of loose throw rugs in walkways, pet beds, electrical cords, etc? Yes  Adequate lighting in your home to reduce risk of falls? Yes   ASSISTIVE DEVICES UTILIZED TO PREVENT FALLS:  Life alert? No  Use of a cane, walker or w/c? No  Grab bars in the bathroom? Yes  Shower chair or bench in shower? No  Elevated toilet seat or a handicapped toilet? Yes   TIMED UP AND GO:  Was the test performed? No .  Length of time to ambulate 10 feet:  sec.     Cognitive Function:        Immunizations Immunization History  Administered Date(s) Administered   Fluad Quad(high Dose 65+) 02/11/2020, 01/20/2021   Influenza, High Dose Seasonal PF 02/08/2018, 02/17/2019   Influenza,inj,Quad PF,6+ Mos 02/28/2017   Influenza-Unspecified 02/28/2017   PFIZER(Purple Top)SARS-COV-2 Vaccination 06/06/2019, 06/24/2019, 04/21/2020, 10/07/2020, 02/22/2021   Pneumococcal Conjugate-13 07/05/2016   Pneumococcal Polysaccharide-23 04/29/2019   Td 12/23/2018   Zoster Recombinat (Shingrix) 06/06/2021    TDAP status: Up to date  Flu Vaccine status: Up to date  Pneumococcal vaccine status: Up to date  Covid-19 vaccine status: Information provided on how to obtain vaccines.   Qualifies for Shingles Vaccine? Yes   Zostavax completed No   Shingrix Completed?: No.    Education has been provided regarding the  importance of this vaccine. Patient has been advised to call insurance company to determine out of pocket expense if they have not yet received this vaccine. Advised may also receive vaccine at local pharmacy or Health Dept. Verbalized acceptance and understanding.  Screening Tests Health Maintenance  Topic Date Due   FOOT EXAM  Never done   COLONOSCOPY (Pts 45-40yrs Insurance coverage will need to be confirmed)  09/07/2019   COVID-19 Vaccine (6 - Booster for Pfizer series) 04/19/2021   Zoster Vaccines- Shingrix (2 of 2)  08/01/2021   HEMOGLOBIN A1C  09/09/2021   OPHTHALMOLOGY EXAM  11/01/2021   INFLUENZA VACCINE  12/13/2021   URINE MICROALBUMIN  05/23/2022   MAMMOGRAM  05/28/2023   TETANUS/TDAP  12/22/2028   Pneumonia Vaccine 35+ Years old  Completed   DEXA SCAN  Completed   Hepatitis C Screening  Completed   HPV VACCINES  Aged Out    Health Maintenance  Health Maintenance Due  Topic Date Due   FOOT EXAM  Never done   COLONOSCOPY (Pts 45-61yrs Insurance coverage will need to be confirmed)  09/07/2019   COVID-19 Vaccine (6 - Booster for Pfizer series) 04/19/2021   Zoster Vaccines- Shingrix (2 of 2) 08/01/2021   HEMOGLOBIN A1C  09/09/2021    Colorectal cancer screening: Type of screening: Colonoscopy. Completed 09/06/09. Repeat every 10 years  Mammogram status: Completed 05/27/2021. Repeat every year  Bone Density status: Completed 03/27/2017. Results reflect: Bone density results: OSTEOPENIA. Repeat every   years.  Lung Cancer Screening: (Low Dose CT Chest recommended if Age 62-80 years, 30 pack-year currently smoking OR have quit w/in 15years.) does not qualify.   Lung Cancer Screening Referral:   Additional Screening:  Hepatitis C Screening: does not qualify; Completed 03/31/2019  Vision Screening: Recommended annual ophthalmology exams for early detection of glaucoma and other disorders of the eye. Is the patient up to date with their annual eye exam?  Yes  Who is the  provider or what is the name of the office in which the patient attends annual eye exams? [facility] If pt is not established with a provider, would they like to be referred to a provider to establish care? No .   Dental Screening: Recommended annual dental exams for proper oral hygiene  Community Resource Referral / Chronic Care Management: CRR required this visit?  No   CCM required this visit?  No      Plan:     I have personally reviewed and noted the following in the patient's chart:   Medical and social history Use of alcohol, tobacco or illicit drugs  Current medications and supplements including opioid prescriptions.  Functional ability and status Nutritional status Physical activity Advanced directives List of other physicians Hospitalizations, surgeries, and ER visits in previous 12 months Vitals Screenings to include cognitive, depression, and falls Referrals and appointments  In addition, I have reviewed and discussed with patient certain preventive protocols, quality metrics, and best practice recommendations. A written personalized care plan for preventive services as well as general preventive health recommendations were provided to patient.     Jill Side, [place]   10/31/2021   Nurse Notes:

## 2021-11-02 DIAGNOSIS — D224 Melanocytic nevi of scalp and neck: Secondary | ICD-10-CM | POA: Diagnosis not present

## 2021-11-04 ENCOUNTER — Encounter: Payer: Self-pay | Admitting: Family Medicine

## 2021-11-04 ENCOUNTER — Ambulatory Visit (INDEPENDENT_AMBULATORY_CARE_PROVIDER_SITE_OTHER): Payer: Medicare Other | Admitting: Family Medicine

## 2021-11-04 VITALS — BP 130/76 | HR 91 | Ht 66.0 in | Wt 233.4 lb

## 2021-11-04 DIAGNOSIS — R002 Palpitations: Secondary | ICD-10-CM | POA: Diagnosis not present

## 2021-11-04 NOTE — Progress Notes (Signed)
Established Patient Office Visit  Subjective:  Patient ID: Kara [name], female    DOB: 09-Mar-1946  Age: 76 y.o. MRN: [id number]  CC:  Chief Complaint  Patient presents with   Palpitations    Pt c/o heart palpitations onset 6 months ago, on and off, feels that her heart flutters.     HPI Kara [name] is a 76 y.o. female with past medical history of GERD presents  with c/o of intermittent palpations for over 6 months. Last echo completed on 05/04/20 was unremarkable. Since the previous echo, she had covid twice and was treated for breast cancer.   She notes today that she has been having palpitations regularly within the last 6 months, occurring 2-3 times in the last month. She denies chest pain, chest tightness, and SOB.   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  The STAT Breast cancer panel offered by [facility] includes sequencing and rearrangement analysis for the following 9 genes:  ATM, BRCA1, BRCA2, CDH1, CHEK2, PALB2, PTEN, STK11 and TP53.   The report date is August 09, 2019.   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 fibroids   BREAST BIOPSY Left 05/2019   left breast DCIS   BREAST LUMPECTOMY Left 06/2019   DCIS   BREAST LUMPECTOMY WITH RADIOACTIVE SEED LOCALIZATION Left 07/08/2019   Procedure: LEFT BREAST LUMPECTOMY WITH RADIOACTIVE SEED LOCALIZATION;  Surgeon: Griselda Miner, MD;  Location: [facility];  Service: General;  Laterality: Left;   BREAST SURGERY N/A    Phreesia 08/30/2020   CESAREAN SECTION     CESAREAN SECTION N/A    Phreesia 08/30/2020   CHOLECYSTECTOMY     COLONOSCOPY  2011   [place]: two 3-4 mm sessile polyps, hyperplastic, sigmoid diverticula, TI appeared normal.    ESOPHAGOGASTRODUODENOSCOPY  2011   [place]: non-bleeding erosive gastropathy, normal duodenum. path: unremarkable duodenum, negative H.pylori, minimal esophagitis    TONSILLECTOMY  1975    Family History  Problem Relation Age  of Onset   Breast cancer Maternal Grandmother        dx over 54   Prostate cancer Maternal Grandfather    Arthritis Mother    COPD Mother    Breast cancer Mother 67       second at age 74   Cancer Father        throat   Stroke Father    Cancer Maternal Aunt 66       Kara [name], cervical cancer   Breast cancer Maternal Aunt        dx over 70   Breast cancer Paternal Aunt        dx under 50   Prostate cancer Maternal Uncle        dx over 64   Cancer Other        father's maternal cousin was Henreietta [name], Cervical   Cancer Maternal Uncle        unknown cancer   Breast cancer  Paternal Aunt        dx over 90   Colon cancer Neg Hx    Allergic rhinitis Neg Hx    Angioedema Neg Hx    Asthma Neg Hx    Atopy Neg Hx    Eczema Neg Hx    Urticaria Neg Hx    Immunodeficiency Neg Hx     Social History   Socioeconomic History   Marital status: Divorced    Spouse name: Not on file   Number of children: 2   Years of education: 15   Highest education level: Not on file  Occupational History   Occupation: retired    Comment: Loss adjuster, chartered at a Database administrator  Tobacco Use   Smoking status: Former    Types: Cigarettes    Quit date: 06/15/1998    Years since quitting: 23.4   Smokeless tobacco: Never   Tobacco comments:    quit 2002  Vaping Use   Vaping Use: Never used  Substance and Sexual Activity   Alcohol use: No   Drug use: No   Sexual activity: Not Currently    Birth control/protection: Surgical    Comment: hyst  Other Topics Concern   Not on file  Social History Narrative   Lives alone   2 grown children- one in [place] and one here   Grandchildren       Moved to [place] from [place] for aging mother who is 58 with Alzheimers      Maternal Aunt is Animator of the [employer] cancer call line      Enjoy: gym, time with friends       Diet: eats all food groups   Caffeine: 2 diet cokes daily    Water: 2-4 cups daily       Wears seat belt    Does not use phone while driving    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   [employer]    No weapons    Social Determinants of Health   Financial Resource Strain: Low Risk  (10/06/2020)   Overall Financial Resource Strain (CARDIA)    Difficulty of Paying Living Expenses: Not hard at all  Food Insecurity: No Food Insecurity (10/06/2020)   Hunger Vital Sign    Worried About Running Out of Food in the Last Year: Never true    Ran Out of Food in the Last Year: Never true  Transportation Needs: No Transportation Needs (10/06/2020)   PRAPARE - Transportation  Lack of  Transportation (Medical): No    Lack of Transportation (Non-Medical): No  Physical Activity: Sufficiently Active (10/06/2020)   Exercise Vital Sign    Days of Exercise per Week: 7 days    Minutes of Exercise per Session: 30 min  Stress: No Stress Concern Present (10/06/2020)   [employer] of Occupational Health - Occupational Stress Questionnaire    Feeling of Stress : Not at all  Social Connections: Moderately Isolated (10/06/2020)   Social Connection and Isolation Panel [NHANES]    Frequency of Communication with Friends and Family: More than three times a week    Frequency of Social Gatherings with Friends and Family: More than three times a week    Attends Religious Services: Never    Database administrator or Organizations: Yes    Attends Engineer, structural: More than 4 times per year    Marital Status: Divorced  Intimate Partner Violence: Not At Risk (12/04/2019)   Humiliation, Afraid, Rape, and Kick questionnaire    Fear of Current or Ex-Partner: No    Emotionally Abused: No    Physically Abused: No    Sexually Abused: No    Outpatient Medications Prior to Visit  Medication Sig Dispense Refill   acetaminophen (TYLENOL) 500 MG tablet Take 500 mg by mouth as needed.     albuterol (VENTOLIN HFA) 108 (90 Base) MCG/ACT inhaler INHALE 1 TO 2 PUFFS INTO THE LUNGS EVERY 6 HOURS AS NEEDED FOR WHEEZING OR SHORTNESS OF BREATH 6.7 g 2   Ascorbic Acid (VITAMIN C WITH ROSE HIPS) 500 MG tablet Take 500 mg by mouth daily.     Calcium Carb-Cholecalciferol (CALCIUM 600 + D PO) Take by mouth daily at 6 (six) AM.     cholecalciferol (VITAMIN D3) 25 MCG (1000 UNIT) tablet Take 2,000 Units by mouth daily.     EPINEPHrine 0.3 mg/0.3 mL IJ SOAJ injection Inject 0.3 mg into the skin as needed. 2 each 2   ibuprofen (ADVIL) 600 MG tablet Take 1 tablet (600 mg total) by mouth every 8 (eight) hours as needed. (Patient taking differently: Take 600 mg by mouth as needed.) 30 tablet 0    SYNTHROID 112 MCG tablet TAKE 1 TABLET(112 MCG) BY MOUTH DAILY 90 tablet 1   No facility-administered medications prior to visit.    Allergies  Allergen Reactions   Coconut (Cocos Nucifera) Anaphylaxis   Cocos Nucifera Anaphylaxis   Other Shortness Of Breath    Covid booster pfizer     Prednisone Hypertension    Severe headache   Naproxen Other (See Comments)    headache    ROS Review of Systems  Respiratory:  Negative for chest tightness and shortness of breath.   Cardiovascular:  Positive for palpitations. Negative for chest pain.  Neurological:  Negative for weakness, light-headedness, numbness and headaches.      Objective:    Physical Exam Cardiovascular:     Rate and Rhythm: Normal rate and regular rhythm.     Pulses: Normal pulses.     Heart sounds: Normal heart sounds.  Pulmonary:     Effort: Pulmonary effort is normal.     Breath sounds: Normal breath sounds.  Abdominal:     Palpations: Abdomen is soft.  Neurological:     Mental Status: She is alert.     BP 130/76   Pulse 91   Ht 5\' 6"  (1.676 m)   Wt 233 lb 6.4 oz (105.9 kg)   SpO2 97%  BMI 37.67 kg/m  Wt Readings from Last 3 Encounters:  11/04/21 233 lb 6.4 oz (105.9 kg)  10/06/21 230 lb (104.3 kg)  08/17/21 239 lb (108.4 kg)    Lab Results  Component Value Date   TSH 0.980 07/07/2021   Lab Results  Component Value Date   WBC 6.8 10/25/2020   HGB 13.5 10/25/2020   HCT 42.3 10/25/2020   MCV 82 10/25/2020   PLT 203 10/25/2020   Lab Results  Component Value Date   NA 142 05/23/2021   K 4.9 05/23/2021   CO2 24 05/23/2021   GLUCOSE 109 (H) 05/23/2021   BUN 15 05/23/2021   CREATININE 0.83 05/23/2021   BILITOT 0.3 10/25/2020   ALKPHOS 133 (H) 10/25/2020   AST 23 10/25/2020   ALT 22 10/25/2020   PROT 6.9 10/25/2020   ALBUMIN 3.9 10/25/2020   CALCIUM 10.3 05/23/2021   ANIONGAP 7 01/19/2019   EGFR 74 05/23/2021   Lab Results  Component Value Date   CHOL 180 07/07/2021   Lab  Results  Component Value Date   HDL 49 07/07/2021   Lab Results  Component Value Date   LDLCALC 117 (H) 07/07/2021   Lab Results  Component Value Date   TRIG 74 07/07/2021   Lab Results  Component Value Date   CHOLHDL 3.7 07/07/2021   Lab Results  Component Value Date   HGBA1C 6.4 03/11/2021      Assessment & Plan:   Problem List Items Addressed This Visit       Other   Palpitation    -EKG completed today shows 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   Follow-up: No follow-ups on file.    Gilmore Laroche, FNP

## 2021-11-07 ENCOUNTER — Encounter: Payer: Self-pay | Admitting: Internal Medicine

## 2021-11-08 ENCOUNTER — Telehealth: Payer: Self-pay | Admitting: *Deleted

## 2021-11-08 NOTE — Telephone Encounter (Signed)
Spoke with pt. She needed to cancel procedure scheduled for Dr. Marletta Lor on 7/11. She reports she has a lot of medical issues going on right now and can't have procedure done. She will call when ready to r/s. FYI to [employer]

## 2021-11-09 NOTE — Telephone Encounter (Signed)
Please inform that, at this time, no further tests can be done.

## 2021-11-11 ENCOUNTER — Encounter: Payer: Self-pay | Admitting: Internal Medicine

## 2021-11-11 ENCOUNTER — Ambulatory Visit (INDEPENDENT_AMBULATORY_CARE_PROVIDER_SITE_OTHER): Payer: Medicare Other | Admitting: Internal Medicine

## 2021-11-11 VITALS — BP 128/82 | HR 84 | Resp 18 | Ht 66.0 in | Wt 234.6 lb

## 2021-11-11 DIAGNOSIS — E782 Mixed hyperlipidemia: Secondary | ICD-10-CM

## 2021-11-11 DIAGNOSIS — R7303 Prediabetes: Secondary | ICD-10-CM

## 2021-11-11 DIAGNOSIS — E890 Postprocedural hypothyroidism: Secondary | ICD-10-CM | POA: Diagnosis not present

## 2021-11-11 DIAGNOSIS — R002 Palpitations: Secondary | ICD-10-CM | POA: Diagnosis not present

## 2021-11-11 NOTE — Progress Notes (Signed)
Established Patient Office Visit  Subjective:  Patient ID: Kara [name], female    DOB: 1946-02-04  Age: 76 y.o. MRN: [id number]  CC:  Chief Complaint  Patient presents with   Follow-up    Follow up pt is having heart palpitations and fatigue was saw 11-04-21 did ekg and referral to cardio     HPI Kara [name] is a 76 y.o. female with past medical history of GERD and hypothyroidism who presents for f/u of her chronic medical conditions.  She complains of 2 episodes of palpitations in the last week.  She denies any excessive exertional activity during those episodes.  Her palpitations lasted for few seconds.  Denies any chest pain or dizziness.  Denies any fever, chills, nasal congestion, postnasal drip or sinus pressure.  She had EKG done on 11/04/21, which was mostly unremarkable.  She had been referred to cardiology.  She has been taking Synthroid regularly for her hypothyroidism.    Past Medical History:  Diagnosis Date   Allergy    food allergies, drug allergies   Anemia    Anxiety    Arthritis    Breast cancer ([facility]) 05/2019   left breast DCIS   Breast pain, left 02/25/2018   Cataract    Change in bowel function 04/09/2018   Chronic pain of left knee 09/03/2019   Colon polyps 08/23/2016   COVID-19 virus infection 01/14/2020   Dyspepsia 09/29/2016   Family history of breast cancer    Family history of cancer 08/23/2016   Aunt is Ursula Beath   Family history of prostate cancer    Genetic testing 08/11/2019   Negative genetic testing on the [facility] 9-gene STAT panel.  The STAT Breast cancer panel offered by [facility] includes sequencing and rearrangement analysis for the following 9 genes:  ATM, BRCA1, BRCA2, CDH1, CHEK2, PALB2, PTEN, STK11 and TP53.   The report date is August 09, 2019.   GERD (gastroesophageal reflux disease)    H/O swallowed foreign body 11/20/2018   Headache in back of head 12/31/2018   Hip pain, acute, left 03/27/2019   HLD (hyperlipidemia)  08/23/2016   Hyperlipidemia    Hypothyroid 08/14/2016   Insomnia due to stress 12/23/2018   Malignant neoplasm of upper-outer quadrant of left breast in female, estrogen receptor negative ([facility]) 06/11/2019   DCIS left breast   Perimenopausal vasomotor symptoms 02/11/2016   Personal history of radiation therapy 2021   completed left breast radiation in April 2021   Postmenopausal atrophic vaginitis 11/09/2015   Pre-diabetes    no meds   Prediabetes 08/30/2017   Thyroid disease    Grave's   Vaginal discharge 01/16/2019   Weight loss, unintentional 03/27/2019    Past Surgical History:  Procedure Laterality Date   ABDOMINAL HYSTERECTOMY     bleeding. fibroids   BREAST BIOPSY Left 05/2019   left breast DCIS   BREAST LUMPECTOMY Left 06/2019   DCIS   BREAST LUMPECTOMY WITH RADIOACTIVE SEED LOCALIZATION Left 07/08/2019   Procedure: LEFT BREAST LUMPECTOMY WITH RADIOACTIVE SEED LOCALIZATION;  Surgeon: Jovita Kussmaul, MD;  Location: [facility];  Service: General;  Laterality: Left;   BREAST SURGERY N/A    Phreesia 08/30/2020   CESAREAN SECTION     CESAREAN SECTION N/A    Phreesia 08/30/2020   CHOLECYSTECTOMY     COLONOSCOPY  2011   [place]: two 3-4 mm sessile polyps, hyperplastic, sigmoid diverticula, TI appeared normal.    ESOPHAGOGASTRODUODENOSCOPY  2011   [place]: non-bleeding  erosive gastropathy, normal duodenum. path: unremarkable duodenum, negative H.pylori, minimal esophagitis    TONSILLECTOMY  1975    Family History  Problem Relation Age of Onset   Breast cancer Maternal Grandmother        dx over 50   Prostate cancer Maternal Grandfather    Arthritis Mother    COPD Mother    Breast cancer Mother 72       second at age 78   Cancer Father        throat   Stroke Father    Cancer Maternal Aunt 30       [name], cervical cancer   Breast cancer Maternal Aunt        dx over 50   Breast cancer Paternal Aunt        dx under 50   Prostate cancer Maternal  Uncle        dx over 50   Cancer Other        father's maternal cousin was Henreietta Lacks, Cervical   Cancer Maternal Uncle        unknown cancer   Breast cancer Paternal Aunt        dx over 50   Colon cancer Neg Hx    Allergic rhinitis Neg Hx    Angioedema Neg Hx    Asthma Neg Hx    Atopy Neg Hx    Eczema Neg Hx    Urticaria Neg Hx    Immunodeficiency Neg Hx     Social History   Socioeconomic History   Marital status: Divorced    Spouse name: Not on file   Number of children: 2   Years of education: 15   Highest education level: Not on file  Occupational History   Occupation: retired    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  Tobacco Use   Smoking status: Former    Types: Cigarettes    Quit date: 06/15/1998    Years since quitting: 23.4   Smokeless tobacco: Never   Tobacco comments:    quit 2002  Vaping Use   Vaping Use: Never used  Substance and Sexual Activity   Alcohol use: No   Drug use: No   Sexual activity: Not Currently    Birth control/protection: Surgical    Comment: hyst  Other Topics Concern   Not on file  Social History Narrative   Lives alone   2 grown children- one in [place] and one here   Grandchildren       Moved to [place] from [place]   Cares for aging mother who is 92 with Alzheimers      Maternal Aunt is [name] of the [employer] cancer call line      Enjoy: gym, time with friends       Diet: eats all food groups   Caffeine: 2 diet cokes daily    Water: 2-4 cups daily       Wears seat belt    Does not use phone while driving    Smoke and carbon detectors at home   Fire extinguisher    No weapons    Social Determinants of Health   Financial Resource Strain: Low Risk  (10/06/2020)   Overall Financial Resource Strain (CARDIA)    Difficulty of Paying Living Expenses: Not hard at all  Food Insecurity: No Food Insecurity (10/06/2020)   Hunger Vital Sign    Worried About Running Out of Food in the Last Year:  Never   true    Ran Out of Food in the Last Year: Never true  Transportation Needs: No Transportation Needs (10/06/2020)   PRAPARE - Transportation    Lack of Transportation (Medical): No    Lack of Transportation (Non-Medical): No  Physical Activity: Sufficiently Active (10/06/2020)   Exercise Vital Sign    Days of Exercise per Week: 7 days    Minutes of Exercise per Session: 30 min  Stress: No Stress Concern Present (10/06/2020)   [employer]ational [employer] - Occupational Stress Questionnaire    Feeling of Stress : Not at all  Social Connections: Moderately Isolated (10/06/2020)   Social Connection and Isolation Panel [NHANES]    Frequency of Communication with Friends and Family: More than three times a week    Frequency of Social Gatherings with Friends and Family: More than three times a week    Attends Religious Services: Never    Active Member of Clubs or Organizations: Yes    Attends Club or Organization Meetings: More than 4 times per year    Marital Status: Divorced  Intimate Partner Violence: Not At Risk (12/04/2019)   Humiliation, Afraid, Rape, and Kick questionnaire    Fear of Current or Ex-Partner: No    Emotionally Abused: No    Physically Abused: No    Sexually Abused: No    Outpatient Medications Prior to Visit  Medication Sig Dispense Refill   acetaminophen (TYLENOL) 500 MG tablet Take 500 mg by mouth as needed.     albuterol (VENTOLIN HFA) 108 (90 Base) MCG/ACT inhaler INHALE 1 TO 2 PUFFS INTO THE LUNGS EVERY 6 HOURS AS NEEDED FOR WHEEZING OR SHORTNESS OF BREATH 6.7 g 2   Ascorbic Acid (VITAMIN C WITH ROSE HIPS) 500 MG tablet Take 500 mg by mouth daily.     Calcium Carb-Cholecalciferol (CALCIUM 600 + D PO) Take by mouth daily at 6 (six) AM.     cholecalciferol (VITAMIN D3) 25 MCG (1000 UNIT) tablet Take 2,000 Units by mouth daily.     EPINEPHrine 0.3 mg/0.3 mL IJ SOAJ injection Inject 0.3 mg into the skin as needed. 2 each 2   ibuprofen (ADVIL) 600 MG  tablet Take 1 tablet (600 mg total) by mouth every 8 (eight) hours as needed. (Patient taking differently: Take 600 mg by mouth as needed.) 30 tablet 0   SYNTHROID 112 MCG tablet TAKE 1 TABLET(112 MCG) BY MOUTH DAILY 90 tablet 1   No facility-administered medications prior to visit.    Allergies  Allergen Reactions   Coconut (Cocos Nucifera) Anaphylaxis   Cocos Nucifera Anaphylaxis   Other Shortness Of Breath    Covid booster pfizer     Prednisone Hypertension    Severe headache   Naproxen Other (See Comments)    headache    ROS Review of Systems  Constitutional:  Negative for chills and fever.  HENT:  Negative for congestion, sinus pressure, sinus pain and sore throat.   Eyes:  Negative for pain and discharge.  Respiratory:  Negative for cough and shortness of breath.   Cardiovascular:  Positive for leg swelling. Negative for chest pain and palpitations.  Gastrointestinal:  Negative for abdominal pain, constipation, diarrhea, nausea and vomiting.  Endocrine: Negative for polydipsia and polyuria.  Genitourinary:  Negative for dysuria and hematuria.  Musculoskeletal:  Positive for arthralgias. Negative for neck pain and neck stiffness.  Skin:  Negative for rash.  Neurological:  Negative for dizziness and weakness.  Psychiatric/Behavioral:  Negative for agitation and behavioral problems.         Objective:    Physical Exam Vitals reviewed.  Constitutional:      General: She is not in acute distress.    Appearance: She is obese. She is not diaphoretic.  HENT:     Head: Normocephalic and atraumatic.     Nose: Nose normal. No congestion.     Mouth/Throat:     Mouth: Mucous membranes are moist.     Pharynx: No posterior oropharyngeal erythema.  Eyes:     General: No scleral icterus.    Extraocular Movements: Extraocular movements intact.  Cardiovascular:     Rate and Rhythm: Normal rate and regular rhythm.     Pulses: Normal pulses.     Heart sounds: Normal heart  sounds. No murmur heard. Pulmonary:     Breath sounds: Normal breath sounds. No wheezing or rales.  Musculoskeletal:     Cervical back: Neck supple. No tenderness.     Right lower leg: Edema (1+) present.     Left lower leg: Edema (1+) present.  Skin:    General: Skin is warm.     Findings: No rash.  Neurological:     General: No focal deficit present.     Mental Status: She is alert and oriented to person, place, and time.     Sensory: No sensory deficit.     Motor: No weakness.  Psychiatric:        Mood and Affect: Mood normal.        Behavior: Behavior normal.     BP 128/82 (BP Location: Right Arm, Patient Position: Sitting, Cuff Size: Normal)   Pulse 84   Resp 18   Ht 5' 6" (1.676 m)   Wt 234 lb 9.6 oz (106.4 kg)   SpO2 98%   BMI 37.87 kg/m  Wt Readings from Last 3 Encounters:  11/11/21 234 lb 9.6 oz (106.4 kg)  11/04/21 233 lb 6.4 oz (105.9 kg)  10/06/21 230 lb (104.3 kg)    Lab Results  Component Value Date   TSH 0.980 07/07/2021   Lab Results  Component Value Date   WBC 6.8 10/25/2020   HGB 13.5 10/25/2020   HCT 42.3 10/25/2020   MCV 82 10/25/2020   PLT 203 10/25/2020   Lab Results  Component Value Date   NA 142 05/23/2021   K 4.9 05/23/2021   CO2 24 05/23/2021   GLUCOSE 109 (H) 05/23/2021   BUN 15 05/23/2021   CREATININE 0.83 05/23/2021   BILITOT 0.3 10/25/2020   ALKPHOS 133 (H) 10/25/2020   AST 23 10/25/2020   ALT 22 10/25/2020   PROT 6.9 10/25/2020   ALBUMIN 3.9 10/25/2020   CALCIUM 10.3 05/23/2021   ANIONGAP 7 01/19/2019   EGFR 74 05/23/2021   Lab Results  Component Value Date   CHOL 180 07/07/2021   Lab Results  Component Value Date   HDL 49 07/07/2021   Lab Results  Component Value Date   LDLCALC 117 (H) 07/07/2021   Lab Results  Component Value Date   TRIG 74 07/07/2021   Lab Results  Component Value Date   CHOLHDL 3.7 07/07/2021   Lab Results  Component Value Date   HGBA1C 6.4 03/11/2021      Assessment & Plan:    Problem List Items Addressed This Visit       Endocrine   Hypothyroidism following radioiodine therapy    Lab Results  Component Value Date   TSH 0.980 07/07/2021  On Levothyroxine, f/u with Dr Nida          Other   Mixed hyperlipidemia    Continue to follow low cholesterol diet for now      Prediabetes    Lab Results  Component Value Date   HGBA1C 6.4 03/11/2021  Advised to follow low carb diet for now      Intermittent palpitations - Primary    EKG reviewed from the last visit-sinus rhythm, mildly short PR interval, no PAC or PVC Would avoid beta-blocker for now until cardiology evaluation Could be due to dehydration or anxiety Advised to maintain adequate hydration for now       No orders of the defined types were placed in this encounter.   Follow-up: Return in about 6 months (around 05/13/2022).    Rutwik K Patel, MD 

## 2021-11-11 NOTE — Patient Instructions (Signed)
Please continue taking medications as prescribed.  Please continue to maintain at least 64 ounces of fluid in a day and eat at regular intervals.

## 2021-11-11 NOTE — Assessment & Plan Note (Signed)
Lab Results  Component Value Date   HGBA1C 6.4 03/11/2021   Advised to follow low carb diet for now

## 2021-11-11 NOTE — Assessment & Plan Note (Signed)
EKG reviewed from the last visit-sinus rhythm, mildly short PR interval, no PAC or PVC Would avoid beta-blocker for now until cardiology evaluation Could be due to dehydration or anxiety Advised to maintain adequate hydration for now

## 2021-11-11 NOTE — Assessment & Plan Note (Signed)
Continue to follow low cholesterol diet for now

## 2021-11-11 NOTE — Assessment & Plan Note (Signed)
Lab Results  Component Value Date   TSH 0.980 07/07/2021   On Levothyroxine, f/u with Dr Dorris Fetch

## 2021-11-17 ENCOUNTER — Ambulatory Visit: Payer: Medicare Other | Admitting: Internal Medicine

## 2021-11-18 ENCOUNTER — Other Ambulatory Visit: Payer: Self-pay | Admitting: "Endocrinology

## 2021-11-21 ENCOUNTER — Ambulatory Visit: Payer: Medicare Other | Admitting: Internal Medicine

## 2021-11-22 ENCOUNTER — Encounter (HOSPITAL_COMMUNITY): Admission: RE | Payer: Self-pay | Source: Home / Self Care

## 2021-11-22 ENCOUNTER — Ambulatory Visit (HOSPITAL_COMMUNITY): Admission: RE | Admit: 2021-11-22 | Payer: Medicare Other | Source: Home / Self Care

## 2021-11-22 SURGERY — COLONOSCOPY WITH PROPOFOL
Anesthesia: Monitor Anesthesia Care

## 2021-11-23 ENCOUNTER — Ambulatory Visit: Payer: Medicare Other | Admitting: Family

## 2021-11-24 ENCOUNTER — Ambulatory Visit: Payer: Medicare Other | Admitting: Cardiovascular Disease

## 2021-11-24 ENCOUNTER — Ambulatory Visit: Payer: Medicare Other | Admitting: Internal Medicine

## 2021-12-05 ENCOUNTER — Ambulatory Visit (INDEPENDENT_AMBULATORY_CARE_PROVIDER_SITE_OTHER): Payer: Medicare Other | Admitting: Internal Medicine

## 2021-12-05 ENCOUNTER — Encounter: Payer: Self-pay | Admitting: Internal Medicine

## 2021-12-05 DIAGNOSIS — U071 COVID-19: Secondary | ICD-10-CM

## 2021-12-05 MED ORDER — NIRMATRELVIR/RITONAVIR (PAXLOVID)TABLET: 3.0000 | ORAL_TABLET | Freq: Two times a day (BID) | ORAL | 0 refills | Status: DC

## 2021-12-05 MED ORDER — NIRMATRELVIR/RITONAVIR (PAXLOVID)TABLET: 3.0000 | ORAL_TABLET | Freq: Two times a day (BID) | ORAL | 0 refills | Status: AC

## 2021-12-05 NOTE — Telephone Encounter (Signed)
Appt scheduled

## 2021-12-05 NOTE — Telephone Encounter (Signed)
Patient needs a virtual visit to discuss

## 2021-12-05 NOTE — Progress Notes (Signed)
Virtual Visit via Telephone Note   This visit type was conducted due to national recommendations for restrictions regarding the COVID-19 Pandemic (e.g. social distancing) in an effort to limit this patient's exposure and mitigate transmission in our community.  Due to her co-morbid illnesses, this patient is at least at moderate risk for complications without adequate follow up.  This format is felt to be most appropriate for this patient at this time.  The patient did not have access to video technology/had technical difficulties with video requiring transitioning to audio format only (telephone).  All issues noted in this document were discussed and addressed.  No physical exam could be performed with this format.  Evaluation Performed:  Follow-up visit  Date:  12/05/2021   ID:  Kara [name], DOB [birth year], MRN [id number]  Patient Location: Home Provider Location: Office/Clinic  Participants: Patient Location of Patient: Home Location of Provider: Telehealth Consent was obtain for visit to be over via telehealth. I verified that I am speaking with the correct person using two identifiers.  PCP:  Lindell Spar, MD   Chief Complaint: Sore throat and cough  History of Present Illness:    Kara [name] is a 76 y.o. female who has televisit for complaint of sore throat, cough, chills and fatigue since yesterday.  She tested positive for COVID today.  She denies any dyspnea or wheezing currently.  The patient does have symptoms concerning for COVID-19 infection (fever, chills, cough, or new shortness of breath).   Past Medical, Surgical, Social History, Allergies, and Medications have been Reviewed.  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  The STAT Breast cancer panel offered by [facility] includes sequencing and rearrangement analysis for the following 9 genes:  ATM, BRCA1, BRCA2, CDH1, CHEK2, PALB2, PTEN, STK11 and TP53.   The report date is August 09, 2019.   GERD (gastroesophageal reflux disease)    H/O swallowed foreign body 11/20/2018   Headache in back of head 12/31/2018   Hip pain, acute, left 03/27/2019   HLD (hyperlipidemia) 08/23/2016   Hyperlipidemia    Hypothyroid 08/14/2016   Insomnia due to stress 12/23/2018   Malignant neoplasm of upper-outer quadrant of left breast in female, estrogen receptor negative ([place]) 06/11/2019   DCIS left breast   Perimenopausal vasomotor symptoms 02/11/2016   Personal history of radiation therapy 2021   completed left breast radiation in April 2021   Postmenopausal atrophic vaginitis 11/09/2015   Pre-diabetes    no meds   Prediabetes 08/30/2017   Thyroid disease    Grave's   Vaginal discharge 01/16/2019   Weight loss, unintentional 03/27/2019   Past Surgical History:  Procedure Laterality Date   ABDOMINAL HYSTERECTOMY     bleeding. fibroids   BREAST BIOPSY Left 05/2019   left breast DCIS  BREAST LUMPECTOMY Left 06/2019   DCIS   BREAST LUMPECTOMY WITH RADIOACTIVE SEED LOCALIZATION Left 07/08/2019   Procedure: LEFT BREAST LUMPECTOMY WITH RADIOACTIVE SEED LOCALIZATION;  Surgeon: Jovita Kussmaul, MD;  Location: [facility];  Service: General;  Laterality: Left;   BREAST SURGERY N/A    Phreesia 08/30/2020   CESAREAN SECTION     CESAREAN SECTION N/A    Phreesia 08/30/2020   CHOLECYSTECTOMY     COLONOSCOPY  2011   [place]: two 3-4 mm sessile polyps,  hyperplastic, sigmoid diverticula, TI appeared normal.    ESOPHAGOGASTRODUODENOSCOPY  2011   [place]: non-bleeding erosive gastropathy, normal duodenum. path: unremarkable duodenum, negative H.pylori, minimal esophagitis    TONSILLECTOMY  1975     Current Meds  Medication Sig   acetaminophen (TYLENOL) 500 MG tablet Take 500 mg by mouth as needed.   albuterol (VENTOLIN HFA) 108 (90 Base) MCG/ACT inhaler INHALE 1 TO 2 PUFFS INTO THE LUNGS EVERY 6 HOURS AS NEEDED FOR WHEEZING OR SHORTNESS OF BREATH   Ascorbic Acid (VITAMIN C WITH ROSE HIPS) 500 MG tablet Take 500 mg by mouth daily.   Calcium Carb-Cholecalciferol (CALCIUM 600 + D PO) Take by mouth daily at 6 (six) AM.   cholecalciferol (VITAMIN D3) 25 MCG (1000 UNIT) tablet Take 2,000 Units by mouth daily.   EPINEPHrine 0.3 mg/0.3 mL IJ SOAJ injection Inject 0.3 mg into the skin as needed.   ibuprofen (ADVIL) 600 MG tablet Take 1 tablet (600 mg total) by mouth every 8 (eight) hours as needed. (Patient taking differently: Take 600 mg by mouth as needed.)   SYNTHROID 112 MCG tablet TAKE 1 TABLET(112 MCG) BY MOUTH DAILY     Allergies:   Coconut (cocos nucifera), Cocos nucifera, Other, Prednisone, and Naproxen   ROS:   Please see the history of present illness.     All other systems reviewed and are negative.   Labs/Other Tests and Data Reviewed:    Recent Labs: 05/23/2021: BUN 15; Creatinine, Ser 0.83; Potassium 4.9; Sodium 142 07/07/2021: TSH 0.980   Recent Lipid Panel Lab Results  Component Value Date/Time   CHOL 180 07/07/2021 09:32 AM   TRIG 74 07/07/2021 09:32 AM   HDL 49 07/07/2021 09:32 AM   CHOLHDL 3.7 07/07/2021 09:32 AM   CHOLHDL 3.0 10/22/2019 09:58 AM   LDLCALC 117 (H) 07/07/2021 09:32 AM   LDLCALC 114 (H) 10/22/2019 09:58 AM    Wt Readings from Last 3 Encounters:  11/11/21 234 lb 9.6 oz (106.4 kg)  11/04/21 233 lb 6.4 oz (105.9 kg)  10/06/21 230 lb (104.3 kg)     ASSESSMENT & PLAN:    COVID-19  infection Started Paxlovid Mucinex or Robitussin as needed She has promethazine DM syrup - PRN for cough Advised to go to ER if she has dyspnea or chest pain with palpitations  Time:   Today, I have spent 13 minutes reviewing the chart, including problem list, medications, and with the patient with telehealth technology discussing the above problems.   Medication Adjustments/Labs and Tests Ordered: Current medicines are reviewed at length with the patient today.  Concerns regarding medicines are outlined above.   Tests Ordered: No orders of the defined types were placed in this encounter.   Medication Changes: No orders of the defined types were placed in this encounter.    Note: This dictation was prepared with Dragon dictation along with smaller phrase technology. Similar sounding words can be transcribed inadequately or may not be corrected upon review. Any  transcriptional errors that result from this process are unintentional.      Disposition:  Follow up  Signed, Lindell Spar, MD  12/05/2021 4:33 PM     [facility]

## 2021-12-14 ENCOUNTER — Ambulatory Visit (INDEPENDENT_AMBULATORY_CARE_PROVIDER_SITE_OTHER): Payer: Medicare Other | Admitting: Cardiovascular Disease

## 2021-12-14 ENCOUNTER — Encounter: Payer: Self-pay | Admitting: Cardiovascular Disease

## 2021-12-14 VITALS — BP 120/66 | HR 80 | Ht 66.0 in | Wt 235.0 lb

## 2021-12-14 DIAGNOSIS — Z923 Personal history of irradiation: Secondary | ICD-10-CM | POA: Diagnosis not present

## 2021-12-14 DIAGNOSIS — R002 Palpitations: Secondary | ICD-10-CM | POA: Diagnosis not present

## 2021-12-14 DIAGNOSIS — I491 Atrial premature depolarization: Secondary | ICD-10-CM

## 2021-12-14 NOTE — Patient Instructions (Signed)

## 2021-12-14 NOTE — Progress Notes (Signed)
Cardiology Office Note:    Date:  12/17/2021   ID:  Kara [name], DOB 27-Jul-1945, MRN [id number]  PCP:  Kara Spar, MD   [facility] Providers Cardiologist:  Sanda Klein, MD     Referring MD: Kara [name], [place]   Chief Complaint  Patient presents with   Consult  CHARLYN [name] is a 76 y.o. female who is being seen today for the evaluation of abnormal electrocardiogram and irregular heart beat at the request of Kara [name], [place].   History of Present Illness:    Kara [name] is a 76 y.o. female with a hx of hyperlipidemia, prediabetes, peripheral venous insufficiency, treated hypothyroidism, left breast cancer status post surgery and external beam radiation (completed April 2021), GERD, anxiety, who has noticed occasional palpitations.  She has noticed occasional palpitations, but is more worried about the fact that her Apple Watch has demonstrated irregular rhythm and in particular following the report of an abnormal electrocardiogram at her recent office visit.  ECG was abnormal due to borderline short PR interval (PR 116 ms).  She does not have a history of sustained palpitations, known SVT, syncope, valvular heart disease or coronary artery disease.  She does not have hypertension.  She recently had a COVID-19 infection, currently day 11.  The symptoms were quite mild.  She has a smart watch with ECG capability.  We recorded her rhythm during her office visit and she had sustained atrial bigeminy for at least a minute.  She was unaware of the irregular heartbeat.  She felt well during this recording.  She denies exertional angina or dyspnea, orthopnea, PND, worsening lower extremity edema (she always wears compression stockings), claudication, symptoms of stroke/TIA, falls, bleeding problems.  She rarely takes ibuprofen for musculoskeletal complaints.  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  The STAT Breast cancer panel offered by [facility] includes sequencing and rearrangement analysis for the following 9 genes:  ATM, BRCA1, BRCA2, CDH1, CHEK2, PALB2, PTEN, STK11 and TP53.   The report date is August 09, 2019.   GERD (gastroesophageal reflux disease)    H/O swallowed foreign body 11/20/2018   Headache in back of head 12/31/2018   Hip pain, acute, left 03/27/2019   HLD (hyperlipidemia) 08/23/2016   Hyperlipidemia    Hypothyroid 08/14/2016   Insomnia due to stress 12/23/2018   Malignant neoplasm of upper-outer quadrant of left breast in female, estrogen receptor negative ([place]) 06/11/2019   DCIS left breast   Perimenopausal vasomotor symptoms 02/11/2016   Personal history of radiation therapy 2021   completed left breast radiation in April 2021   Postmenopausal atrophic vaginitis 11/09/2015   Pre-diabetes    no meds   Prediabetes 08/30/2017   Thyroid disease    Grave's   Vaginal discharge 01/16/2019   Weight loss, unintentional  03/27/2019    Past Surgical History:  Procedure Laterality Date   ABDOMINAL HYSTERECTOMY     bleeding. fibroids   BREAST BIOPSY Left 05/2019   left breast DCIS   BREAST LUMPECTOMY Left 06/2019   DCIS   BREAST LUMPECTOMY WITH RADIOACTIVE SEED LOCALIZATION Left 07/08/2019   Procedure: LEFT BREAST LUMPECTOMY WITH RADIOACTIVE SEED LOCALIZATION;  Surgeon: Jovita Kussmaul, MD;  Location: [facility];  Service: General;  Laterality: Left;   BREAST SURGERY  N/A    Phreesia 08/30/2020   CESAREAN SECTION     CESAREAN SECTION N/A    Phreesia 08/30/2020   CHOLECYSTECTOMY     COLONOSCOPY  2011   [place]: two 3-4 mm sessile polyps, hyperplastic, sigmoid diverticula, TI appeared normal.    ESOPHAGOGASTRODUODENOSCOPY  2011   [place]: non-bleeding erosive gastropathy, normal duodenum. path: unremarkable duodenum, negative H.pylori, minimal esophagitis    TONSILLECTOMY  1975    Current Medications: Current Meds  Medication Sig   Ascorbic Acid (VITAMIN C WITH ROSE HIPS) 500 MG tablet Take 500 mg by mouth daily.   Calcium Carb-Cholecalciferol (CALCIUM 600 + D PO) Take by mouth daily at 6 (six) AM.   cholecalciferol (VITAMIN D3) 25 MCG (1000 UNIT) tablet Take 2,000 Units by mouth daily.   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  Tobacco Use   Smoking status: Former    Types: Cigarettes    Quit date: 06/15/1998    Years since quitting: 23.5   Smokeless tobacco: Never   Tobacco comments:    quit 2002  Vaping Use   Vaping Use: Never used  Substance and Sexual Activity   Alcohol use: No   Drug use: No   Sexual activity: Not Currently    Birth control/protection: Surgical    Comment: hyst  Other Topics Concern   Not on file  Social History Narrative   Lives alone   2 grown children- one in [place] and one here   Grandchildren       Moved to [place] from [place] for aging mother who is 31 with Alzheimers      Maternal Aunt is Stage manager of the [employer] cancer call line      Enjoy: gym, time with friends       Diet: eats  all food groups   Caffeine: 2 diet cokes daily    Water: 2-4 cups daily       Wears seat belt    Does not use phone while driving    Smoke and carbon Careers adviser at home   [employer]    No weapons    Social Determinants of Health   Financial Resource Strain: Low Risk  (10/06/2020)   Overall Financial Resource Strain (CARDIA)    Difficulty of Paying Living Expenses: Not hard at all  Food Insecurity: No Food Insecurity (10/06/2020)   Hunger Vital Sign    Worried About Running Out of Food in the Last Year: Never true    [place] in the Last  Year: Never true  Transportation Needs: No Transportation Needs (10/06/2020)   PRAPARE - Hydrologist (Medical): No    Lack of Transportation (Non-Medical): No  Physical Activity: Sufficiently Active (10/06/2020)   Exercise Vital Sign    Days of Exercise per Week: 7 days    Minutes of Exercise per Session: 30 min  Stress: No Stress Concern Present (10/06/2020)   [place]    Feeling of Stress : Not at all  Social Connections: Moderately Isolated (10/06/2020)   Social Connection and Isolation Panel [NHANES]    Frequency of Communication with Friends and Family: More than three times a week    Frequency of Social Gatherings with Friends and Family: More than three times a week    Attends Religious Services: Never    Marine scientist or Organizations: Yes    Attends Music therapist: More than 4 times per year    Marital Status: Divorced     Family History: The patient's family history includes Arthritis in her mother; Breast cancer in her maternal aunt, maternal grandmother, paternal aunt, and paternal aunt; Breast cancer (age of onset: 6) in her mother; COPD in her mother; Cancer in her father, maternal uncle, and another family member; Cancer (age of onset: 25) in her maternal aunt; Prostate cancer in her maternal  grandfather and maternal uncle; Stroke in her father. There is no history of Colon cancer, Allergic rhinitis, Angioedema, Asthma, Atopy, Eczema, Urticaria, or Immunodeficiency.  ROS:   Please see the history of present illness.     All other systems reviewed and are negative.  EKGs/Labs/Other Studies Reviewed:    The following studies were reviewed today: Echocardiogram 05/04/2020  1. Left ventricular ejection fraction, by estimation, is 60 to 65%. The  left ventricle has normal function. The left ventricle has no regional  wall motion abnormalities. There is mild left ventricular hypertrophy.  Left ventricular diastolic parameters  are indeterminate.   2. Right ventricular systolic function is normal. The right ventricular  size is normal. Tricuspid regurgitation signal is inadequate for assessing  PA pressure.   3. The mitral valve is grossly normal. Trivial mitral valve  regurgitation.   4. The aortic valve is tricuspid. Aortic valve regurgitation is not  visualized.   5. The inferior vena cava is normal in size with greater than 50%  respiratory variability, suggesting right atrial pressure of 3 mmHg.   Holter monitor July 2019 48-hour Holter monitor reviewed.  Rhythm is sinus throughout.  Heart rate ranged from 55 bpm up to 132 bpm with average heart rate 81 bpm.  There were rare PACs and PVCs without sustained arrhythmia.  No pauses.  No symptom diary for review.  EKG:  EKG is ordered today and demonstrates normal sinus rhythm, normal tracing other than a borderline short PR interval just under 120 ms.  It is very similar to the tracing from her primary care provider's office, but the automatic algorithm did not comment on short PR today.  Recent Labs: 05/23/2021: BUN 15; Creatinine, Ser 0.83; Potassium 4.9; Sodium 142 07/07/2021: TSH 0.980  Recent Lipid Panel    Component Value Date/Time   CHOL 180 07/07/2021 0932   TRIG 74 07/07/2021 0932   HDL 49 07/07/2021 0932   CHOLHDL  3.7 07/07/2021 0932   CHOLHDL 3.0 10/22/2019 0958   VLDL 15 11/24/2016 1018   LDLCALC 117 (H) 07/07/2021 0932   LDLCALC 114 (H)  10/22/2019 0958   12/01/2020 hemoglobin A1c 6.4%  Risk Assessment/Calculations:           Physical Exam:    VS:  BP 120/66 (BP Location: Left Arm, Patient Position: Sitting, Cuff Size: Large)   Pulse 80   Ht 5\' 6"  (1.676 m)   Wt 235 lb (106.6 kg)   SpO2 98%   BMI 37.93 kg/m     Wt Readings from Last 3 Encounters:  12/14/21 235 lb (106.6 kg)  11/11/21 234 lb 9.6 oz (106.4 kg)  11/04/21 233 lb 6.4 oz (105.9 kg)     GEN: Moderate to severely obese, well nourished, well developed in no acute distress HEENT: Normal NECK: No JVD; No carotid bruits LYMPHATICS: No lymphadenopathy CARDIAC: RRR, no murmurs, rubs, gallops RESPIRATORY:  Clear to auscultation without rales, wheezing or rhonchi  ABDOMEN: Soft, non-tender, non-distended MUSCULOSKELETAL:  No edema; No deformity  SKIN: Warm and dry NEUROLOGIC:  Alert and oriented x 3 PSYCHIATRIC:  Normal affect   ASSESSMENT:    1. Palpitations   2. PAC (premature atrial contraction)   3. Severe obesity (BMI 35.0-39.9) with comorbidity (HCC)   4. History of radiation therapy    PLAN:    In order of problems listed above:  Atrial bigeminy: Rarely symptomatic, benign disorder, no evidence of structural heart disease by echocardiography and no evidence of complex atrial arrhythmia (including no atrial fibrillation) by previous arrhythmia monitor.  Offered reassurance, if she desires she can take beta-blockers as needed, but she prefers not to take more medications. Severe obesity: She has dyslipidemia and borderline diabetes mellitus.  She does not have symptoms of daytime hypersomnolence or other signs of obstructive sleep apnea.  Weight loss is encouraged. Recent treatment for breast cancer including external beam radiation: No symptoms or signs to indicate myocarditis/pericarditis or coronary artery  disease.           Medication Adjustments/Labs and Tests Ordered: Current medicines are reviewed at length with the patient today.  Concerns regarding medicines are outlined above.  Orders Placed This Encounter  Procedures   EKG 12-Lead   No orders of the defined types were placed in this encounter.   Patient Instructions  Medication Instructions:  No changes *If you need a refill on your cardiac medications before your next appointment, please call your pharmacy*   Lab Work: None ordered If you have labs (blood work) drawn today and your tests are completely normal, you will receive your results only by: MyChart Message (if you have MyChart) OR A paper copy in the mail If you have any lab test that is abnormal or we need to change your treatment, we will call you to review the results.   Testing/Procedures: None ordered   Follow-Up: At [facility], you and your health needs are our priority.  As part of our continuing mission to provide you with exceptional heart care, we have created designated Provider Care Teams.  These Care Teams include your primary Cardiologist (physician) and Advanced Practice Providers (APPs -  Physician Assistants and Nurse Practitioners) who all work together to provide you with the care you need, when you need it.  We recommend signing up for the patient portal called "MyChart".  Sign up information is provided on this After Visit Summary.  MyChart is used to connect with patients for Virtual Visits (Telemedicine).  Patients are able to view lab/test results, encounter notes, upcoming appointments, etc.  Non-urgent messages can be sent to your provider as well.  To learn more about what you can do with MyChart, go to [email or web address].    Your next appointment:   12 month(s)  The format for your next appointment:   In Person  Provider:   Sanda Klein, MD {   Important Information About Sugar         Signed, Sanda Klein, MD  12/17/2021 9:48 PM    [facility]

## 2021-12-22 ENCOUNTER — Encounter: Payer: Self-pay | Admitting: Cardiovascular Disease

## 2022-01-04 ENCOUNTER — Ambulatory Visit: Payer: Medicare Other | Admitting: Family

## 2022-01-05 DIAGNOSIS — D0512 Intraductal carcinoma in situ of left breast: Secondary | ICD-10-CM | POA: Diagnosis not present

## 2022-01-06 ENCOUNTER — Telehealth: Payer: Self-pay | Admitting: "Endocrinology

## 2022-01-06 DIAGNOSIS — R7303 Prediabetes: Secondary | ICD-10-CM

## 2022-01-06 DIAGNOSIS — E782 Mixed hyperlipidemia: Secondary | ICD-10-CM

## 2022-01-06 DIAGNOSIS — E890 Postprocedural hypothyroidism: Secondary | ICD-10-CM

## 2022-01-06 NOTE — Telephone Encounter (Signed)
New message   Patient is asking for lab order to be sent to [facility] .   Upcoming appt in 9/23.

## 2022-01-06 NOTE — Telephone Encounter (Signed)
Lab orders updated and sent to [facility].

## 2022-01-09 ENCOUNTER — Other Ambulatory Visit: Payer: Self-pay | Admitting: "Endocrinology

## 2022-01-09 ENCOUNTER — Telehealth: Payer: Self-pay | Admitting: "Endocrinology

## 2022-01-09 DIAGNOSIS — E782 Mixed hyperlipidemia: Secondary | ICD-10-CM

## 2022-01-09 DIAGNOSIS — E890 Postprocedural hypothyroidism: Secondary | ICD-10-CM

## 2022-01-09 NOTE — Telephone Encounter (Signed)
error 

## 2022-01-10 LAB — TSH: TSH: 2.72 u[IU]/mL (ref 0.450–4.500)

## 2022-01-10 LAB — LIPID PANEL
Chol/HDL Ratio: 3.5 ratio (ref 0.0–4.4)
Cholesterol, Total: 188 mg/dL (ref 100–199)
HDL: 53 mg/dL (ref >39)
LDL Chol Calc (NIH): 120 mg/dL — ABNORMAL HIGH (ref 0–99)
Triglycerides: 80 mg/dL (ref 0–149)
VLDL Cholesterol Cal: 15 mg/dL (ref 5–40)

## 2022-01-10 LAB — T4, FREE: Free T4: 1.35 ng/dL (ref 0.82–1.77)

## 2022-01-11 DIAGNOSIS — H25811 Combined forms of age-related cataract, right eye: Secondary | ICD-10-CM | POA: Diagnosis not present

## 2022-01-11 DIAGNOSIS — H40021 Open angle with borderline findings, high risk, right eye: Secondary | ICD-10-CM | POA: Diagnosis not present

## 2022-01-11 DIAGNOSIS — R7309 Other abnormal glucose: Secondary | ICD-10-CM | POA: Diagnosis not present

## 2022-01-11 DIAGNOSIS — H401221 Low-tension glaucoma, left eye, mild stage: Secondary | ICD-10-CM | POA: Diagnosis not present

## 2022-01-11 LAB — HM DIABETES EYE EXAM

## 2022-01-13 ENCOUNTER — Ambulatory Visit (INDEPENDENT_AMBULATORY_CARE_PROVIDER_SITE_OTHER): Payer: Medicare Other | Admitting: "Endocrinology

## 2022-01-13 ENCOUNTER — Encounter: Payer: Self-pay | Admitting: "Endocrinology

## 2022-01-13 VITALS — BP 114/82 | HR 64 | Ht 66.0 in | Wt 238.0 lb

## 2022-01-13 DIAGNOSIS — E890 Postprocedural hypothyroidism: Secondary | ICD-10-CM

## 2022-01-13 DIAGNOSIS — E782 Mixed hyperlipidemia: Secondary | ICD-10-CM

## 2022-01-13 DIAGNOSIS — R7303 Prediabetes: Secondary | ICD-10-CM

## 2022-01-13 DIAGNOSIS — E559 Vitamin D deficiency, unspecified: Secondary | ICD-10-CM | POA: Diagnosis not present

## 2022-01-13 LAB — POCT GLYCOSYLATED HEMOGLOBIN (HGB A1C): HbA1c, POC (controlled diabetic range): 6.2 % (ref 0.0–7.0)

## 2022-01-13 MED ORDER — SYNTHROID 112 MCG PO TABS
112.0000 ug | ORAL_TABLET | Freq: Every day | ORAL | 1 refills | Status: DC
Start: 2022-01-13 — End: 2022-05-23

## 2022-01-13 NOTE — Progress Notes (Signed)
01/13/2022, 11:28 AM                         Endocrinology follow-up note   Kara [name] is a 76 y.o.-year-old female patient being seen in follow-up for management of  RAI induced hypothyroidism referred by Lindell Spar, MD.   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  The STAT Breast cancer panel offered by [facility] includes sequencing and rearrangement analysis for the following 9 genes:  ATM, BRCA1, BRCA2, CDH1, CHEK2, PALB2, PTEN, STK11 and TP53.   The report date is August 09, 2019.   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 fibroids   BREAST BIOPSY Left 05/2019   left breast DCIS   BREAST LUMPECTOMY Left 06/2019   DCIS   BREAST LUMPECTOMY WITH RADIOACTIVE SEED LOCALIZATION Left 07/08/2019   Procedure: LEFT BREAST LUMPECTOMY WITH RADIOACTIVE SEED LOCALIZATION;  Surgeon: Jovita Kussmaul, MD;  Location: [facility];  Service: General;  Laterality: Left;   BREAST SURGERY N/A    Phreesia 08/30/2020   CESAREAN SECTION     CESAREAN SECTION N/A    [employer]reesia 08/30/2020   CHOLECYSTECTOMY     COLONOSCOPY  2011   [place]: two 3-4 mm sessile polyps, hyperplastic, sigmoid diverticula, TI appeared normal.    ESOPHAGOGASTRODUODENOSCOPY  2011   [place]: non-bleeding erosive gastropathy, normal duodenum. 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  Tobacco Use   Smoking status: Former    Types: Cigarettes    Quit date: 06/15/1998    Years since quitting: 23.5   Smokeless tobacco: Never   Tobacco comments:    quit 2002  Vaping Use   Vaping Use: Never used  Substance and Sexual Activity   Alcohol use: No   Drug use: No   Sexual activity: Not Currently    Birth control/protection: Surgical    Comment: hyst  Other Topics Concern   Not on file  Social History Narrative   Lives alone   2 grown children- one in [place] and one here   Grandchildren       Moved to [place] from [place] for aging mother who is 56 with Alzheimers      Maternal Aunt is Stage manager of the [employer] cancer call line      Enjoy: gym, time with friends       Diet: eats all food groups    Caffeine: 2 diet cokes daily    Water: 2-4 cups daily       Wears seat belt    Does not use phone while driving    Smoke and carbon Careers adviser at home   [employer]    No weapons    Social Determinants of Health   Financial Resource Strain: Low Risk  (10/06/2020)   Overall Financial Resource Strain (CARDIA)    Difficulty of Paying Living Expenses: Not hard at all  Food Insecurity: No Food Insecurity (10/06/2020)   Hunger Vital Sign    Worried About Running Out of Food in the Last Year: Never true    [place] in the Last Year: Never true  Transportation Needs: No Transportation Needs (10/06/2020)   PRAPARE - Hydrologist (Medical): No    Lack of Transportation (Non-Medical): No  Physical Activity: Sufficiently Active (10/06/2020)   Exercise Vital Sign    Days of Exercise per Week: 7 days    Minutes of Exercise per Session: 30 min  Stress: No Stress Concern Present (10/06/2020)   [place]    Feeling of Stress : Not at all  Social Connections: Moderately Isolated (10/06/2020)   Social Connection and Isolation Panel [NHANES]    Frequency of Communication with Friends and Family: More than three times a week    Frequency of Social Gatherings with Friends and Family: More than three times a week    Attends Religious Services: Never    Marine scientist or Organizations: Yes    Attends Music therapist: More than 4 times per year    Marital Status: Divorced    Family History  Problem Relation Age of Onset   Breast cancer Maternal Grandmother        dx over 59   Prostate cancer Maternal Grandfather    Arthritis Mother    COPD Mother    Breast cancer Mother 90  second at age 74   Cancer Father        throat   Stroke Father    Cancer Maternal Aunt 58       [name], cervical cancer   Breast cancer Maternal Aunt        dx over 72   Breast  cancer Paternal Aunt        dx under 34   Prostate cancer Maternal Uncle        dx over 81   Cancer Other        father's maternal cousin was Henreietta Lacks, Cervical   Cancer Maternal Uncle        unknown cancer   Breast cancer Paternal Aunt        dx over 38   Colon cancer Neg Hx    Allergic rhinitis Neg Hx    Angioedema Neg Hx    Asthma Neg Hx    Atopy Neg Hx    Eczema Neg Hx    Urticaria Neg Hx    Immunodeficiency Neg Hx     Outpatient Encounter Medications as of 01/13/2022  Medication Sig   acetaminophen (TYLENOL) 500 MG tablet Take 500 mg by mouth as needed. (Patient not taking: Reported on 12/14/2021)   albuterol (VENTOLIN HFA) 108 (90 Base) MCG/ACT inhaler INHALE 1 TO 2 PUFFS INTO THE LUNGS EVERY 6 HOURS AS NEEDED FOR WHEEZING OR SHORTNESS OF BREATH (Patient not taking: Reported on 12/14/2021)   Ascorbic Acid (VITAMIN C WITH ROSE HIPS) 500 MG tablet Take 500 mg by mouth daily.   Calcium Carb-Cholecalciferol (CALCIUM 600 + D PO) Take by mouth daily at 6 (six) AM.   cholecalciferol (VITAMIN D3) 25 MCG (1000 UNIT) tablet Take 2,000 Units by mouth daily.   cycloSPORINE (RESTASIS) 0.05 % ophthalmic emulsion SMARTSIG:In Eye(s)   EPINEPHrine 0.3 mg/0.3 mL IJ SOAJ injection Inject 0.3 mg into the skin as needed. (Patient not taking: Reported on 12/14/2021)   ibuprofen (ADVIL) 600 MG tablet Take 1 tablet (600 mg total) by mouth every 8 (eight) hours as needed. (Patient not taking: Reported on 12/14/2021)   latanoprost (XALATAN) 0.005 % ophthalmic solution SMARTSIG:In Eye(s)   SYNTHROID 112 MCG tablet Take 1 tablet (112 mcg total) by mouth daily before breakfast.   [DISCONTINUED] SYNTHROID 112 MCG tablet TAKE 1 TABLET(112 MCG) BY MOUTH DAILY   No facility-administered encounter medications on file as of 01/13/2022.    ALLERGIES: Allergies  Allergen Reactions   Coconut (Cocos Nucifera) Anaphylaxis   Cocos Nucifera Anaphylaxis   Other Shortness Of Breath    Covid booster pfizer      Prednisone Hypertension    Severe headache   Naproxen Other (See Comments)    headache   VACCINATION STATUS: Immunization History  Administered Date(s) Administered   Fluad Quad(high Dose 65+) 02/11/2020, 01/20/2021   Influenza, High Dose Seasonal PF 02/08/2018, 02/17/2019   Influenza,inj,Quad PF,6+ Mos 02/28/2017   Influenza-Unspecified 02/28/2017   PFIZER(Purple Top)SARS-COV-2 Vaccination 06/06/2019, 06/24/2019, 04/21/2020, 10/07/2020, 02/22/2021   Pneumococcal Conjugate-13 07/05/2016   Pneumococcal Polysaccharide-23 04/29/2019   Td 12/23/2018   Zoster Recombinat (Shingrix) 06/06/2021     HPI    UMI MAINOR  is a patient with the above medical history. she was diagnosed  with hyperthyroidism at approximate age of 65 years  which required I-131 thyroid ablation in environment.   -She was subsequently initiated on thyroid hormone supplement.  She is currently on Synthroid 112 mcg p.o. daily before breakfast. She reports compliance and  consistency with her medication.   -she has no new complaints except the fact that she could not lose weight.   She has prediabetes not on medication.  She has dyslipidemia, does not want to go on statins.  She has not changed her diet yet.  -She presents with steady weight since last visit.  She denies tremors, palpitations. Pt denies feeling nodules in neck, hoarseness, dysphagia/odynophagia, SOB with lying down.  she denies family history of  thyroid disorders.  No family history of thyroid cancer.  Her father had head and neck tumor which did not appear to be related to thyroid cancer.  ROS:  Limited as above.   Physical Exam: BP 114/82   Pulse 64   Ht _0  (1.676 m)   Wt 238 lb (108 kg)   BMI 38.41 kg/m  Wt Readings from Last 3 Encounters:  01/13/22 238 lb (108 kg)  12/14/21 235 lb (106.6 kg)  11/11/21 234 lb 9.6 oz (106.4 kg)    CMP     Component Value Date/Time   NA 142 05/23/2021 1031   K 4.9 05/23/2021 1031   CL  104 05/23/2021 1031   CO2 24 05/23/2021 1031   GLUCOSE 109 (H) 05/23/2021 1031   GLUCOSE 107 (H) 03/08/2020 0935   BUN 15 05/23/2021 1031   CREATININE 0.83 05/23/2021 1031   CREATININE 0.75 03/08/2020 0935   CALCIUM 10.3 05/23/2021 1031   PROT 6.9 10/25/2020 0954   ALBUMIN 3.9 10/25/2020 0954   AST 23 10/25/2020 0954   ALT 22 10/25/2020 0954   ALKPHOS 133 (H) 10/25/2020 0954   BILITOT 0.3 10/25/2020 0954   GFRNONAA 70 10/22/2019 0958   GFRAA 82 10/22/2019 0958    Diabetic Labs (most recent): Lab Results  Component Value Date   HGBA1C 6.2 01/13/2022   HGBA1C 6.4 03/11/2021   HGBA1C 5.8 (A) 09/08/2019   MICROALBUR 0.5 08/16/2017   MICROALBUR 0.3 02/19/2017     Lipid Panel ( most recent) Lipid Panel     Component Value Date/Time   CHOL 188 01/09/2022 1049   TRIG 80 01/09/2022 1049   HDL 53 01/09/2022 1049   CHOLHDL 3.5 01/09/2022 1049   CHOLHDL 3.0 10/22/2019 0958   VLDL 15 11/24/2016 1018   LDLCALC 120 (H) 01/09/2022 1049   LDLCALC 114 (H) 10/22/2019 0958   LABVLDL 15 01/09/2022 1049     Lab Results  Component Value Date   TSH 2.720 01/09/2022   TSH 0.980 07/07/2021   TSH 0.62 03/02/2021   TSH 3.340 10/25/2020   TSH 1.48 03/08/2020   TSH 0.78 09/08/2019   TSH 0.34 (L) 02/27/2019   TSH 0.52 01/15/2019   TSH 0.58 08/16/2017   TSH 0.22 (L) 02/19/2017   FREET4 1.35 01/09/2022   FREET4 1.52 07/07/2021   FREET4 1.5 03/02/2021   FREET4 1.56 10/25/2020   FREET4 1.4 03/08/2020   FREET4 1.3 09/08/2019   FREET4 1.5 02/27/2019       ASSESSMENT: 1. Hypothyroidism- RAI induced 2.  Prediabetes- worsening 3. Hyperlipidemia 4. Vitamin d deficiency   PLAN:   Her previsit thyroid function tests are consistent with appropriate replacement.  She is advised to continue Synthroid 112 mcg p.o. daily before breakfast.    - We discussed about the correct intake of her thyroid hormone, on empty stomach at fasting, with water, separated by at least 30 minutes from  breakfast and other medications,  and separated by more than 4 hours from calcium, iron, multivitamins, acid reflux medications (PPIs). -  Patient is made aware of the fact that thyroid hormone replacement is needed for life, dose to be adjusted by periodic monitoring of thyroid function tests.   -Due to absence of clinical goiter, no need for thyroid ultrasound. In light of her metabolic dysfunction indicated by prediabetes, with point-of-care A1c of 6.2% today, severe hyperlipidemia, obesity, she is a good candidate for lifestyle medicine.  - she acknowledges that there is a room for improvement in her food and drink choices. - Suggestion is made for her to avoid simple carbohydrates  from her diet including Cakes, Sweet Desserts, Ice Cream, Soda (diet and regular), Sweet Tea, Candies, Chips, Cookies, Store Bought Juices, Alcohol , Artificial Sweeteners,  Coffee Creamer, and "Sugar-free" Products, Lemonade. This will help patient to have more stable blood glucose profile and potentially avoid unintended weight gain.  The following Lifestyle Medicine recommendations according to [place]  [facility]) were discussed and and offered to patient and she  agrees to start the journey:  A. Whole Foods, Plant-Based Nutrition comprising of fruits and vegetables, plant-based proteins, whole-grain carbohydrates was discussed in detail with the patient.   A list for source of those nutrients were also provided to the patient.  Patient will use only water or unsweetened tea for hydration. B.  The need to stay away from risky substances including alcohol, smoking; obtaining 7 to 9 hours of restorative sleep, at least 150 minutes of moderate intensity exercise weekly, the importance of healthy social connections,  and stress management techniques were discussed. C.  A full color page of  Calorie density of various food groups per pound showing examples of each food groups was provided to the  patient.  This approach will help her with weight management, prediabetes, HPL, HTN.  - she is advised to continue vitamin D3 2000 units daily.   She will have previsit labs as well as point-of-care A1c repeated here during her next visit.    She is advised to maintain close follow-up with her PCP.       Return in about 3 months (around 04/14/2022) for F/U with Pre-visit Labs, A1c -NV.  Glade Lloyd, MD [facility] [address] [place], [place] [address] Phone: [phone]  Fax: [phone]   01/13/2022, 11:28 AM  This note was partially dictated with voice recognition software. Similar sounding words can be transcribed inadequately or may not  be corrected upon review.

## 2022-01-13 NOTE — Patient Instructions (Signed)
                                     Advice for Weight Management  -For most of us the best way to lose weight is by diet management. Generally speaking, diet management means consuming less calories intentionally which over time brings about progressive weight loss.  This can be achieved more effectively by avoiding ultra processed carbohydrates, processed meats, unhealthy fats.    It is critically important to know your numbers: how much calorie you are consuming and how much calorie you need. More importantly, our carbohydrates sources should be unprocessed naturally occurring  complex starch food items.  It is always important to balance nutrition also by  appropriate intake of proteins (mainly plant-based), healthy fats/oils, plenty of fruits and vegetables.   -The American College of Lifestyle Medicine (ACL M) recommends nutrition derived mostly from Whole Food, Plant Predominant Sources example an apple instead of applesauce or apple pie. Eat Plenty of vegetables, Mushrooms, fruits, Legumes, Whole Grains, Nuts, seeds in lieu of processed meats, processed snacks/pastries red meat, poultry, eggs.  Use only water or unsweetened tea for hydration.  The College also recommends the need to stay away from risky substances including alcohol, smoking; obtaining 7-9 hours of restorative sleep, at least 150 minutes of moderate intensity exercise weekly, importance of healthy social connections, and being mindful of stress and seek help when it is overwhelming.    -Sticking to a routine mealtime to eat 3 meals a day and avoiding unnecessary snacks is shown to have a big role in weight control. Under normal circumstances, the only time we burn stored energy is when we are hungry, so allow  some hunger to take place- hunger means no food between appropriate meal times, only water.  It is not advisable to starve.   -It is better to avoid simple carbohydrates including:  Cakes, Sweet Desserts, Ice Cream, Soda (diet and regular), Sweet Tea, Candies, Chips, Cookies, Store Bought Juices, Alcohol in Excess of  1-2 drinks a day, Lemonade,  Artificial Sweeteners, Doughnuts, Coffee Creamers, "Sugar-free" Products, etc, etc.  This is not a complete list.....    -Consulting with certified diabetes educators is proven to provide you with the most accurate and current information on diet.  Also, you may be  interested in discussing diet options/exchanges , we can schedule a visit with Kara [name], RDN, CDE for individualized nutrition education.  -Exercise: If you are able: 30 -60 minutes a day ,4 days a week, or 150 minutes of moderate intensity exercise weekly.    The longer the better if tolerated.  Combine stretch, strength, and aerobic activities.  If you were told in the past that you have high risk for cardiovascular diseases, or if you are currently symptomatic, you may seek evaluation by your heart doctor prior to initiating moderate to intense exercise programs.                                  Additional Care Considerations for Diabetes/Prediabetes   -Diabetes  is a chronic disease.  The most important care consideration is regular follow-up with your diabetes care provider with the goal being avoiding or delaying its complications and to take advantage of advances in medications and technology.  If appropriate actions are taken early enough, type 2 diabetes can even be   reversed.  Seek information from the right source.  - Whole Food, Plant Predominant Nutrition is highly recommended: Eat Plenty of vegetables, Mushrooms, fruits, Legumes, Whole Grains, Nuts, seeds in lieu of processed meats, processed snacks/pastries red meat, poultry, eggs as recommended by American College of  Lifestyle Medicine (ACLM).  -Type 2 diabetes is known to coexist with other important comorbidities such as high blood pressure and high cholesterol.  It is critical to control not only the  diabetes but also the high blood pressure and high cholesterol to minimize and delay the risk of complications including coronary artery disease, stroke, amputations, blindness, etc.  The good news is that this diet recommendation for type 2 diabetes is also very helpful for managing high cholesterol and high blood blood pressure.  - Studies showed that people with diabetes will benefit from a class of medications known as ACE inhibitors and statins.  Unless there are specific reasons not to be on these medications, the standard of care is to consider getting one from these groups of medications at an optimal doses.  These medications are generally considered safe and proven to help protect the heart and the kidneys.    - People with diabetes are encouraged to initiate and maintain regular follow-up with eye doctors, foot doctors, dentists , and if necessary heart and kidney doctors.     - It is highly recommended that people with diabetes quit smoking or stay away from smoking, and get yearly  flu vaccine and pneumonia vaccine at least every 5 years.  See above for additional recommendations on exercise, sleep, stress management , and healthy social connections.      

## 2022-01-19 ENCOUNTER — Encounter: Payer: Self-pay | Admitting: *Deleted

## 2022-01-24 ENCOUNTER — Encounter: Payer: Self-pay | Admitting: Internal Medicine

## 2022-01-25 ENCOUNTER — Ambulatory Visit (INDEPENDENT_AMBULATORY_CARE_PROVIDER_SITE_OTHER): Payer: Medicare Other

## 2022-01-25 DIAGNOSIS — Z23 Encounter for immunization: Secondary | ICD-10-CM | POA: Diagnosis not present

## 2022-02-03 ENCOUNTER — Encounter: Payer: Self-pay | Admitting: Internal Medicine

## 2022-02-09 DIAGNOSIS — H401221 Low-tension glaucoma, left eye, mild stage: Secondary | ICD-10-CM | POA: Diagnosis not present

## 2022-02-09 DIAGNOSIS — H40021 Open angle with borderline findings, high risk, right eye: Secondary | ICD-10-CM | POA: Diagnosis not present

## 2022-02-14 ENCOUNTER — Encounter: Payer: Self-pay | Admitting: Internal Medicine

## 2022-02-15 ENCOUNTER — Ambulatory Visit (INDEPENDENT_AMBULATORY_CARE_PROVIDER_SITE_OTHER): Payer: Medicare Other | Admitting: Internal Medicine

## 2022-02-15 ENCOUNTER — Encounter: Payer: Self-pay | Admitting: Internal Medicine

## 2022-02-15 DIAGNOSIS — J0110 Acute frontal sinusitis, unspecified: Secondary | ICD-10-CM | POA: Diagnosis not present

## 2022-02-15 MED ORDER — BENZONATATE 100 MG PO CAPS: 100.0000 mg | ORAL_CAPSULE | Freq: Two times a day (BID) | ORAL | 0 refills | Status: DC | PRN

## 2022-02-15 MED ORDER — AMOXICILLIN-POT CLAVULANATE 875-125 MG PO TABS: 1.0000 | ORAL_TABLET | Freq: Two times a day (BID) | ORAL | 0 refills | Status: DC

## 2022-02-15 NOTE — Progress Notes (Signed)
Virtual Visit via Telephone Note   This visit type was conducted due to national recommendations for restrictions regarding the COVID-19 Pandemic (e.g. social distancing) in an effort to limit this patient's exposure and mitigate transmission in our community.  Due to her co-morbid illnesses, this patient is at least at moderate risk for complications without adequate follow up.  This format is felt to be most appropriate for this patient at this time.  The patient did not have access to video technology/had technical difficulties with video requiring transitioning to audio format only (telephone).  All issues noted in this document were discussed and addressed.  No physical exam could be performed with this format.  Evaluation Performed:  Follow-up visit  Date:  02/15/2022   ID:  Kara [name], DOB [birth year], MRN [id number]  Patient Location: Home Provider Location: Office/Clinic  Participants: Patient Location of Patient: Home Location of Provider: Telehealth Consent was obtain for visit to be over via telehealth. I verified that I am speaking with the correct person using two identifiers.  PCP:  Lindell Spar, MD   Chief Complaint: Cough, fever and sinus pressure  History of Present Illness:    Kara [name] is a 76 y.o. female who has a televisit for complaint of cough with clear sputum, nasal congestion and sinus pressure for the last 1 week.  She had fever initially, but has resolved now.  She denies any dyspnea or wheezing currently.  She had negative COVID test at home.  She is up-to-date with COVID, flu and RSV vaccines.  The patient does not have symptoms concerning for COVID-19 infection (fever, chills, cough, or new shortness of breath).   Past Medical, Surgical, Social History, Allergies, and Medications have been Reviewed.  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  The STAT Breast cancer panel offered by [facility] includes sequencing and rearrangement analysis for the following 9 genes:  ATM, BRCA1, BRCA2, CDH1, CHEK2, PALB2, PTEN, STK11 and TP53.   The report date is August 09, 2019.   GERD (gastroesophageal reflux disease)    H/O swallowed foreign body 11/20/2018   Headache in back of head 12/31/2018   Hip pain, acute, left 03/27/2019   HLD (hyperlipidemia) 08/23/2016   Hyperlipidemia    Hypothyroid 08/14/2016   Insomnia due to stress 12/23/2018   Malignant neoplasm of upper-outer quadrant of left breast in female, estrogen receptor negative ([place]) 06/11/2019   DCIS left breast   Perimenopausal vasomotor symptoms 02/11/2016   Personal history of radiation therapy 2021   completed left breast radiation in April 2021   Postmenopausal atrophic vaginitis 11/09/2015   Pre-diabetes    no meds   Prediabetes 08/30/2017   Thyroid disease    Grave's   Vaginal discharge 01/16/2019   Weight loss, unintentional 03/27/2019   Past  Surgical History:  Procedure Laterality Date   ABDOMINAL HYSTERECTOMY     bleeding. fibroids   BREAST BIOPSY Left 05/2019   left breast DCIS   BREAST LUMPECTOMY Left 06/2019   DCIS   BREAST LUMPECTOMY WITH RADIOACTIVE SEED LOCALIZATION Left 07/08/2019   Procedure: LEFT BREAST LUMPECTOMY WITH RADIOACTIVE SEED LOCALIZATION;  Surgeon: Jovita Kussmaul, MD;  Location: [facility];  Service: General;  Laterality: Left;   BREAST SURGERY N/A    Phreesia 08/30/2020   CESAREAN  SECTION     CESAREAN SECTION N/A    Phreesia 08/30/2020   CHOLECYSTECTOMY     COLONOSCOPY  2011   [place]: two 3-4 mm sessile polyps, hyperplastic, sigmoid diverticula, TI appeared normal.    ESOPHAGOGASTRODUODENOSCOPY  2011   [place]: non-bleeding erosive gastropathy, normal duodenum. path: unremarkable duodenum, negative H.pylori, minimal esophagitis    TONSILLECTOMY  1975     Current Meds  Medication Sig   acetaminophen (TYLENOL) 500 MG tablet Take 500 mg by mouth as needed.   albuterol (VENTOLIN HFA) 108 (90 Base) MCG/ACT inhaler INHALE 1 TO 2 PUFFS INTO THE LUNGS EVERY 6 HOURS AS NEEDED FOR WHEEZING OR SHORTNESS OF BREATH   Ascorbic Acid (VITAMIN C WITH ROSE HIPS) 500 MG tablet Take 500 mg by mouth daily.   Calcium Carb-Cholecalciferol (CALCIUM 600 + D PO) Take by mouth daily at 6 (six) AM.   cholecalciferol (VITAMIN D3) 25 MCG (1000 UNIT) tablet Take 2,000 Units by mouth daily.   cycloSPORINE (RESTASIS) 0.05 % ophthalmic emulsion SMARTSIG:In Eye(s)   EPINEPHrine 0.3 mg/0.3 mL IJ SOAJ injection Inject 0.3 mg into the skin as needed.   ibuprofen (ADVIL) 600 MG tablet Take 1 tablet (600 mg total) by mouth every 8 (eight) hours as needed.   latanoprost (XALATAN) 0.005 % ophthalmic solution SMARTSIG:In Eye(s)   SYNTHROID 112 MCG tablet Take 1 tablet (112 mcg total) by mouth daily before breakfast.     Allergies:   Coconut (cocos nucifera), Cocos nucifera, Other, Prednisone, and Naproxen   ROS:   Please see the history of present illness.     All other systems reviewed and are negative.   Labs/Other Tests and Data Reviewed:    Recent Labs: 05/23/2021: BUN 15; Creatinine, Ser 0.83; Potassium 4.9; Sodium 142 01/09/2022: TSH 2.720   Recent Lipid Panel Lab Results  Component Value Date/Time   CHOL 188 01/09/2022 10:49 AM   TRIG 80 01/09/2022 10:49 AM   HDL 53 01/09/2022 10:49 AM   CHOLHDL 3.5 01/09/2022 10:49 AM   CHOLHDL 3.0 10/22/2019 09:58 AM   LDLCALC 120 (H) 01/09/2022  10:49 AM   LDLCALC 114 (H) 10/22/2019 09:58 AM    Wt Readings from Last 3 Encounters:  01/13/22 238 lb (108 kg)  12/14/21 235 lb (106.6 kg)  11/11/21 234 lb 9.6 oz (106.4 kg)     ASSESSMENT & PLAN:    Acute sinusitis Started empiric Augmentin as she has persistent symptoms despite symptomatic treatment with TheraFlu and ibuprofen Nasal saline spray as needed for nasal congestion Mucinex or Robitussin-DM as needed for cough Tessalon as needed for cough  Time:   Today, I have spent 9 minutes reviewing the chart, including problem list, medications, and with the patient with telehealth technology discussing the above problems.   Medication Adjustments/Labs and Tests Ordered: Current medicines are reviewed at length with the patient today.  Concerns regarding medicines are outlined above.   Tests Ordered: No orders of the defined types were placed in this encounter.  Medication Changes: No orders of the defined types were placed in this encounter.    Note: This dictation was prepared with Dragon dictation along with smaller phrase technology. Similar sounding words can be transcribed inadequately or may not be corrected upon review. Any transcriptional errors that result from this process are unintentional.      Disposition:  Follow up  Signed, Lindell Spar, MD  02/15/2022 11:30 AM     [facility]

## 2022-02-20 ENCOUNTER — Encounter: Payer: Self-pay | Admitting: Internal Medicine

## 2022-03-30 ENCOUNTER — Encounter: Payer: Self-pay | Admitting: Internal Medicine

## 2022-03-30 DIAGNOSIS — M7989 Other specified soft tissue disorders: Secondary | ICD-10-CM

## 2022-03-31 MED ORDER — FUROSEMIDE 20 MG PO TABS: 20.0000 mg | ORAL_TABLET | Freq: Every day | ORAL | 0 refills | Status: DC | PRN

## 2022-03-31 NOTE — Telephone Encounter (Signed)
I spoke with Kara [name] regarding her concerns.  She would like to try Lasix 20 mg as needed again for bilateral lower extremity edema.  Her edema has recently worsened and she has noted numbness along the dorsum of her foot.  She is concerned that this is related to worsening edema.  I have prescribed Lasix 20 mg as needed for edema.  She will schedule an appointment for in person evaluation if her symptoms not improve.

## 2022-04-05 ENCOUNTER — Telehealth: Payer: Self-pay | Admitting: *Deleted

## 2022-04-05 ENCOUNTER — Encounter: Payer: Self-pay | Admitting: *Deleted

## 2022-04-05 ENCOUNTER — Other Ambulatory Visit: Payer: Self-pay | Admitting: Gastroenterology

## 2022-04-05 ENCOUNTER — Ambulatory Visit (INDEPENDENT_AMBULATORY_CARE_PROVIDER_SITE_OTHER): Payer: Medicare Other | Admitting: Gastroenterology

## 2022-04-05 ENCOUNTER — Encounter: Payer: Self-pay | Admitting: Gastroenterology

## 2022-04-05 VITALS — BP 121/79 | HR 71 | Temp 97.4°F | Ht 66.0 in | Wt 232.8 lb

## 2022-04-05 DIAGNOSIS — Z1211 Encounter for screening for malignant neoplasm of colon: Secondary | ICD-10-CM

## 2022-04-05 DIAGNOSIS — R1011 Right upper quadrant pain: Secondary | ICD-10-CM | POA: Diagnosis not present

## 2022-04-05 MED ORDER — PANTOPRAZOLE SODIUM 40 MG PO TBEC: 40.0000 mg | DELAYED_RELEASE_TABLET | Freq: Every day | ORAL | 1 refills | Status: DC

## 2022-04-05 NOTE — Telephone Encounter (Signed)
Pt just had visit today. She is requesting a 90 supply of her Pantoprazole 40 mg tab

## 2022-04-05 NOTE — Patient Instructions (Addendum)
Start pantoprazole '40mg'$  daily before breakfast.  Upper endoscopy and colonoscopy to be scheduled.

## 2022-04-05 NOTE — H&P (View-Only) (Signed)
GI Office Note    Referring Provider: Lindell Spar, MD Primary Care Physician:  Lindell Spar, MD  Primary Gastroenterologist: Elon Alas. Abbey Chatters, DO   Chief Complaint   Chief Complaint  Patient presents with   Abdominal Pain    Abdominal pain and to reschedule colonoscopy. Abd pain upper right side. Her pain level is 8     History of Present Illness   Kara [name] is a 76 y.o. female presenting today to reschedule being in for further evaluation of abdominal pain.  She has a history of left breast cancer in 2020, hyperplastic colonic polyps (2011), chronic abdominal pain.  She was last seen in 2020 for GERD and right upper quadrant pain.  Patient presents with complaints of chronic intermittent right upper quadrant pain which she has had off and on for years.  Recently had a flare, feels like pain is worse than usual.  Area is sore to touch, burning in quality.  She also notes worsening pain with certain foods such as peppermint, spicy foods, nuts, pretzels or other hard items.  Typically will use some Pepcid but seems to help some, symptoms do not go away completely.  Usually does not have typical heartburn type symptoms unless she eats late.  Denies dysphagia.  With this last flare she cannot lay on the right side because it hurt too much.  States she had her gallbladder removed remotely for this pain did not make it any better.  For the most part bowel movements are regular.  Occasionally has constipation.  Denies any blood in the stool or melena.  She has had some minor low back pain after starting new exercises.  Denies any history of significant back issues to suggest radiculopathy.   EGD 2011: Minimal esophagitis on biopsy, nonbleeding erosive gastropathy, normal duodenum.  Pathology in the stomach unremarkable, negative for H. pylori.  Duodenal biopsy negative for celiac disease.  Colonoscopy 2011: two 3-87m hyperplastic rectal polyps, [place] diverticula.     Medications   Current Outpatient Medications  Medication Sig Dispense Refill   acetaminophen (TYLENOL) 500 MG tablet Take 500 mg by mouth as needed.     albuterol (VENTOLIN HFA) 108 (90 Base) MCG/ACT inhaler INHALE 1 TO 2 PUFFS INTO THE LUNGS EVERY 6 HOURS AS NEEDED FOR WHEEZING OR SHORTNESS OF BREATH 6.7 g 2   Ascorbic Acid (VITAMIN C WITH ROSE HIPS) 500 MG tablet Take 500 mg by mouth daily.     Calcium Carb-Cholecalciferol (CALCIUM 600 + D PO) Take by mouth daily at 6 (six) AM.     cholecalciferol (VITAMIN D3) 25 MCG (1000 UNIT) tablet Take 2,000 Units by mouth daily.     furosemide (LASIX) 20 MG tablet Take 1 tablet (20 mg total) by mouth daily as needed for edema. 30 tablet 0   latanoprost (XALATAN) 0.005 % ophthalmic solution SMARTSIG:In Eye(s)     SYNTHROID 112 MCG tablet Take 1 tablet (112 mcg total) by mouth daily before breakfast. 90 tablet 1   No current facility-administered medications for this visit.    Allergies   Allergies as of 04/05/2022 - Review Complete 04/05/2022  Allergen Reaction Noted   Coconut (cocos nucifera) Anaphylaxis 06/30/2020   Cocos nucifera Anaphylaxis 06/30/2020   Other Shortness Of Breath 01/20/2011   Prednisone Hypertension 12/03/2018   Naproxen Other (See Comments) 11/09/2015    Past Medical History   Past Medical History:  Diagnosis Date   Allergy    food allergies, drug allergies  Anemia    Anxiety    Arthritis    Breast cancer ([place]) 05/2019   left breast DCIS   Breast pain, left 02/25/2018   Cataract    Change in bowel function 04/09/2018   Chronic pain of left knee 09/03/2019   Colon polyps 08/23/2016   COVID-19 virus infection 01/14/2020   Dyspepsia 09/29/2016   Family history of breast cancer    Family history of cancer 08/23/2016   Aunt is Ursula Beath   Family history of prostate cancer    Genetic testing 08/11/2019   Negative genetic testing on the [facility] 9-gene STAT panel.  The STAT Breast cancer panel offered by  [facility] includes sequencing and rearrangement analysis for the following 9 genes:  ATM, BRCA1, BRCA2, CDH1, CHEK2, PALB2, PTEN, STK11 and TP53.   The report date is August 09, 2019.   GERD (gastroesophageal reflux disease)    H/O swallowed foreign body 11/20/2018   Headache in back of head 12/31/2018   Hip pain, acute, left 03/27/2019   HLD (hyperlipidemia) 08/23/2016   Hyperlipidemia    Hypothyroid 08/14/2016   Insomnia due to stress 12/23/2018   Malignant neoplasm of upper-outer quadrant of left breast in female, estrogen receptor negative ([place]) 06/11/2019   DCIS left breast   Perimenopausal vasomotor symptoms 02/11/2016   Personal history of radiation therapy 2021   completed left breast radiation in April 2021   Postmenopausal atrophic vaginitis 11/09/2015   Pre-diabetes    no meds   Prediabetes 08/30/2017   Thyroid disease    Grave's   Vaginal discharge 01/16/2019   Weight loss, unintentional 03/27/2019    Past Surgical History   Past Surgical History:  Procedure Laterality Date   ABDOMINAL HYSTERECTOMY     bleeding. fibroids   BREAST BIOPSY Left 05/2019   left breast DCIS   BREAST LUMPECTOMY Left 06/2019   DCIS   BREAST LUMPECTOMY WITH RADIOACTIVE SEED LOCALIZATION Left 07/08/2019   Procedure: LEFT BREAST LUMPECTOMY WITH RADIOACTIVE SEED LOCALIZATION;  Surgeon: Jovita Kussmaul, MD;  Location: [facility];  Service: General;  Laterality: Left;   BREAST SURGERY N/A    Phreesia 08/30/2020   CESAREAN SECTION     CESAREAN SECTION N/A    Phreesia 08/30/2020   CHOLECYSTECTOMY     COLONOSCOPY  2011   [place]: two 3-4 mm sessile polyps, hyperplastic, sigmoid diverticula, TI appeared normal.    ESOPHAGOGASTRODUODENOSCOPY  2011   [place]: non-bleeding erosive gastropathy, normal duodenum. path: unremarkable duodenum, negative H.pylori, minimal esophagitis    TONSILLECTOMY  1975    Past Family History   Family History  Problem Relation Age of Onset   Breast cancer  Maternal Grandmother        dx over 30   Prostate cancer Maternal Grandfather    Arthritis Mother    COPD Mother    Breast cancer Mother 12       second at age 42   Cancer Father        throat   Stroke Father    Cancer Maternal Aunt 70       [name], cervical cancer   Breast cancer Maternal Aunt        dx over 98   Breast cancer Paternal Aunt        dx under 1   Prostate cancer Maternal Uncle        dx over 38   Cancer Other        father's maternal cousin was Henreietta  Lacks, Cervical   Cancer Maternal Uncle        unknown cancer   Breast cancer Paternal Aunt        dx over 37   Colon cancer Neg Hx    Allergic rhinitis Neg Hx    Angioedema Neg Hx    Asthma Neg Hx    Atopy Neg Hx    Eczema Neg Hx    Urticaria Neg Hx    Immunodeficiency Neg Hx     Past Social History   Social History   Socioeconomic History   Marital status: Divorced    Spouse name: Not on file   Number of children: 2   Years of education: 15   Highest education level: Not on file  Occupational History   Occupation: retired    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  Tobacco Use   Smoking status: Former    Types: Cigarettes    Quit date: 06/15/1998    Years since quitting: 23.8   Smokeless tobacco: Never   Tobacco comments:    quit 2002  Vaping Use   Vaping Use: Never used  Substance and Sexual Activity   Alcohol use: No   Drug use: No   Sexual activity: Not Currently    Birth control/protection: Surgical    Comment: hyst  Other Topics Concern   Not on file  Social History Narrative   Lives alone   2 grown children- one in [place] and one here   Grandchildren       Moved to [place] from [place] for aging mother who is 20 with Alzheimers      Maternal Aunt is Stage manager of the [employer] cancer call line      Enjoy: gym, time with friends       Diet: eats all food groups   Caffeine: 2 diet cokes daily    Water: 2-4 cups daily       Wears  seat belt    Does not use phone while driving    Smoke and carbon Careers adviser at home   [employer]    No weapons    Social Determinants of Health   Financial Resource Strain: Low Risk  (10/06/2020)   Overall Financial Resource Strain (CARDIA)    Difficulty of Paying Living Expenses: Not hard at all  Food Insecurity: No Food Insecurity (10/06/2020)   Hunger Vital Sign    Worried About Running Out of Food in the Last Year: Never true    [place] in the Last Year: Never true  Transportation Needs: No Transportation Needs (10/06/2020)   PRAPARE - Hydrologist (Medical): No    Lack of Transportation (Non-Medical): No  Physical Activity: Sufficiently Active (10/06/2020)   Exercise Vital Sign    Days of Exercise per Week: 7 days    Minutes of Exercise per Session: 30 min  Stress: No Stress Concern Present (10/06/2020)   [place]    Feeling of Stress : Not at all  Social Connections: Moderately Isolated (10/06/2020)   Social Connection and Isolation Panel [NHANES]    Frequency of Communication with Friends and Family: More than three times a week    Frequency of Social Gatherings with Friends and Family: More than three times a week    Attends Religious Services: Never    Marine scientist or Organizations: Yes    Attends [employer]  or Organization Meetings: More than 4 times per year    Marital Status: Divorced  Intimate Partner Violence: Not At Risk (12/04/2019)   Humiliation, Afraid, Rape, and Kick questionnaire    Fear of Current or Ex-Partner: No    Emotionally Abused: No    Physically Abused: No    Sexually Abused: No    Review of Systems   General: Negative for anorexia, weight loss, fever, chills, fatigue, weakness. Eyes: Negative for vision changes.  ENT: Negative for hoarseness, difficulty swallowing , nasal congestion. CV: Negative for chest pain, angina, palpitations,  dyspnea on exertion, peripheral edema.  Respiratory: Negative for dyspnea at rest, dyspnea on exertion, cough, sputum, wheezing.  GI: See history of present illness. GU:  Negative for dysuria, hematuria, urinary incontinence, urinary frequency, nocturnal urination.  MS: Negative for joint pain, low back pain.  Derm: Negative for rash or itching.  Neuro: Negative for weakness, abnormal sensation, seizure, frequent headaches, memory loss,  confusion.  Psych: Negative for anxiety, depression, suicidal ideation, hallucinations.  Endo: Negative for unusual weight change.  Heme: Negative for bruising or bleeding. Allergy: Negative for rash or hives.  Physical Exam   BP 121/79   Pulse 71   Temp (!) 97.4 F (36.3 C)   Ht _0  (1.676 m)   Wt 232 lb 12.8 oz (105.6 kg)   BMI 37.57 kg/m    General: Well-nourished, well-developed in no acute distress.  Head: Normocephalic, atraumatic.   Eyes: Conjunctiva pink, no icterus. Mouth: Oropharyngeal mucosa moist and pink , no lesions erythema or exudate. Neck: Supple without thyromegaly, masses, or lymphadenopathy.  Lungs: Clear to auscultation bilaterally.  Heart: Regular rate and rhythm, no murmurs rubs or gallops.  Abdomen: Bowel sounds are normal, nontender, nondistended, no hepatosplenomegaly or masses, no abdominal bruits or hernia, no rebound or guarding.  Tenderness noted over the right anterior lower rib cage Rectal: Not performed Extremities: No lower extremity edema. No clubbing or deformities.  Neuro: Alert and oriented x 4 , grossly normal neurologically.  Skin: Warm and dry, no rash or jaundice.   Psych: Alert and cooperative, normal mood and affect.  Labs   Lab Results  Component Value Date   CREATININE 0.83 05/23/2021   BUN 15 05/23/2021   NA 142 05/23/2021   K 4.9 05/23/2021   CL 104 05/23/2021   CO2 24 05/23/2021     Lab Results  Component Value Date   TSH 2.720 01/09/2022    Imaging Studies   No results  found.  Assessment   Right upper quadrant pain: Chronic, intermittent for years.  Interestingly she has substantial postprandial component to her discomfort.  Location of pain is over the right lower anterior rib cage and reproducible with palpation.  Just this morning she consumed pretzels and noted pain in this mentioned location.  Discussed with patient, somewhat difficult to explain pain in this location this both postprandial and reproduced with palpation.  Given postprandial component, would be reasonable to consider upper endoscopy to rule out gastritis, ulcer.  If EGD is unremarkable, she would likely benefit from imaging.  We will also empirically start on PPI therapy as she has noted some improvement with Pepcid although not complete resolution.  Screening colonoscopy: History of hyperplastic polyps as outlined above.  Due for colonoscopy.   PLAN   Colonoscopy/upper endoscopy with Dr. Abbey Chatters. ASA 2.  I have discussed the risks, alternatives, benefits with regards to but not limited to the risk of reaction to medication, bleeding, infection, perforation  and the patient is agreeable to proceed. Written consent to be obtained. Start pantoprazole 72m daily before breakfast. Separate from synthroid by at least 2 hours.    LLaureen Ochs LBobby Rumpf [facility]

## 2022-04-05 NOTE — Progress Notes (Signed)
GI Office Note    Referring Provider: Lindell Spar, MD Primary Care Physician:  Lindell Spar, MD  Primary Gastroenterologist: Elon Alas. Abbey Chatters, DO   Chief Complaint   Chief Complaint  Patient presents with   Abdominal Pain    Abdominal pain and to reschedule colonoscopy. Abd pain upper right side. Her pain level is 8     History of Present Illness   Kara [name] is a 76 y.o. female presenting today to reschedule being in for further evaluation of abdominal pain.  She has a history of left breast cancer in 2020, hyperplastic colonic polyps (2011), chronic abdominal pain.  She was last seen in 2020 for GERD and right upper quadrant pain.  Patient presents with complaints of chronic intermittent right upper quadrant pain which she has had off and on for years.  Recently had a flare, feels like pain is worse than usual.  Area is sore to touch, burning in quality.  She also notes worsening pain with certain foods such as peppermint, spicy foods, nuts, pretzels or other hard items.  Typically will use some Pepcid but seems to help some, symptoms do not go away completely.  Usually does not have typical heartburn type symptoms unless she eats late.  Denies dysphagia.  With this last flare she cannot lay on the right side because it hurt too much.  States she had her gallbladder removed remotely for this pain did not make it any better.  For the most part bowel movements are regular.  Occasionally has constipation.  Denies any blood in the stool or melena.  She has had some minor low back pain after starting new exercises.  Denies any history of significant back issues to suggest radiculopathy.   EGD 2011: Minimal esophagitis on biopsy, nonbleeding erosive gastropathy, normal duodenum.  Pathology in the stomach unremarkable, negative for H. pylori.  Duodenal biopsy negative for celiac disease.  Colonoscopy 2011: two 3-87m hyperplastic rectal polyps, [place] diverticula.     Medications   Current Outpatient Medications  Medication Sig Dispense Refill   acetaminophen (TYLENOL) 500 MG tablet Take 500 mg by mouth as needed.     albuterol (VENTOLIN HFA) 108 (90 Base) MCG/ACT inhaler INHALE 1 TO 2 PUFFS INTO THE LUNGS EVERY 6 HOURS AS NEEDED FOR WHEEZING OR SHORTNESS OF BREATH 6.7 g 2   Ascorbic Acid (VITAMIN C WITH ROSE HIPS) 500 MG tablet Take 500 mg by mouth daily.     Calcium Carb-Cholecalciferol (CALCIUM 600 + D PO) Take by mouth daily at 6 (six) AM.     cholecalciferol (VITAMIN D3) 25 MCG (1000 UNIT) tablet Take 2,000 Units by mouth daily.     furosemide (LASIX) 20 MG tablet Take 1 tablet (20 mg total) by mouth daily as needed for edema. 30 tablet 0   latanoprost (XALATAN) 0.005 % ophthalmic solution SMARTSIG:In Eye(s)     SYNTHROID 112 MCG tablet Take 1 tablet (112 mcg total) by mouth daily before breakfast. 90 tablet 1   No current facility-administered medications for this visit.    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  The STAT Breast cancer panel offered by  [facility] includes sequencing and rearrangement analysis for the following 9 genes:  ATM, BRCA1, BRCA2, CDH1, CHEK2, PALB2, PTEN, STK11 and TP53.   The report date is August 09, 2019.   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 fibroids   BREAST BIOPSY Left 05/2019   left breast DCIS   BREAST LUMPECTOMY Left 06/2019   DCIS   BREAST LUMPECTOMY WITH RADIOACTIVE SEED LOCALIZATION Left 07/08/2019   Procedure: LEFT BREAST LUMPECTOMY WITH RADIOACTIVE SEED LOCALIZATION;  Surgeon: Jovita Kussmaul, MD;  Location: [facility];  Service: General;  Laterality: Left;   BREAST SURGERY N/A    [employer]reesia 08/30/2020   CESAREAN SECTION     CESAREAN SECTION N/A    [employer]reesia 08/30/2020   CHOLECYSTECTOMY     COLONOSCOPY  2011   [place]: two 3-4 mm sessile polyps, hyperplastic, sigmoid diverticula, TI appeared normal.    ESOPHAGOGASTRODUODENOSCOPY  2011   [place]: non-bleeding erosive gastropathy, normal duodenum. path: unremarkable duodenum, negative H.pylori, minimal esophagitis    TONSILLECTOMY  1975    Past Family History   Family History  Problem Relation Age of Onset   Breast cancer  Maternal Grandmother        dx over 31   Prostate cancer Maternal Grandfather    Arthritis Mother    COPD Mother    Breast cancer Mother 81       second at age 35   Cancer Father        throat   Stroke Father    Cancer Maternal Aunt 51       [name], cervical cancer   Breast cancer Maternal Aunt        dx over 61   Breast cancer Paternal Aunt        dx under 92   Prostate cancer Maternal Uncle        dx over 61   Cancer Other        father's maternal cousin was Henreietta  Lacks, Cervical   Cancer Maternal Uncle        unknown cancer   Breast cancer Paternal Aunt        dx over 37   Colon cancer Neg Hx    Allergic rhinitis Neg Hx    Angioedema Neg Hx    Asthma Neg Hx    Atopy Neg Hx    Eczema Neg Hx    Urticaria Neg Hx    Immunodeficiency Neg Hx     Past Social History   Social History   Socioeconomic History   Marital status: Divorced    Spouse name: Not on file   Number of children: 2   Years of education: 15   Highest education level: Not on file  Occupational History   Occupation: retired    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  Tobacco Use   Smoking status: Former    Types: Cigarettes    Quit date: 06/15/1998    Years since quitting: 23.8   Smokeless tobacco: Never   Tobacco comments:    quit 2002  Vaping Use   Vaping Use: Never used  Substance and Sexual Activity   Alcohol use: No   Drug use: No   Sexual activity: Not Currently    Birth control/protection: Surgical    Comment: hyst  Other Topics Concern   Not on file  Social History Narrative   Lives alone   2 grown children- one in [place] and one here   Grandchildren       Moved to [place] from [place] for aging mother who is 20 with Alzheimers      Maternal Aunt is Stage manager of the [employer] call line      Enjoy: gym, time with friends       Diet: eats all food groups   Caffeine: 2 diet cokes daily    Water: 2-4 cups daily       Wears  seat belt    Does not use phone while driving    Smoke and carbon Careers adviser at home   [employer]    No weapons    Social Determinants of Health   Financial Resource Strain: Low Risk  (10/06/2020)   Overall Financial Resource Strain (CARDIA)    Difficulty of Paying Living Expenses: Not hard at all  Food Insecurity: No Food Insecurity (10/06/2020)   Hunger Vital Sign    Worried About Running Out of Food in the Last Year: Never true    [place] in the Last Year: Never true  Transportation Needs: No Transportation Needs (10/06/2020)   PRAPARE - Hydrologist (Medical): No    Lack of Transportation (Non-Medical): No  Physical Activity: Sufficiently Active (10/06/2020)   Exercise Vital Sign    Days of Exercise per Week: 7 days    Minutes of Exercise per Session: 30 min  Stress: No Stress Concern Present (10/06/2020)   [place]    Feeling of Stress : Not at all  Social Connections: Moderately Isolated (10/06/2020)   Social Connection and Isolation Panel [NHANES]    Frequency of Communication with Friends and Family: More than three times a week    Frequency of Social Gatherings with Friends and Family: More than three times a week    Attends Religious Services: Never    Marine scientist or Organizations: Yes    Attends [employer]  or Organization Meetings: More than 4 times per year    Marital Status: Divorced  Intimate Partner Violence: Not At Risk (12/04/2019)   Humiliation, Afraid, Rape, and Kick questionnaire    Fear of Current or Ex-Partner: No    Emotionally Abused: No    Physically Abused: No    Sexually Abused: No    Review of Systems   General: Negative for anorexia, weight loss, fever, chills, fatigue, weakness. Eyes: Negative for vision changes.  ENT: Negative for hoarseness, difficulty swallowing , nasal congestion. CV: Negative for chest pain, angina, palpitations,  dyspnea on exertion, peripheral edema.  Respiratory: Negative for dyspnea at rest, dyspnea on exertion, cough, sputum, wheezing.  GI: See history of present illness. GU:  Negative for dysuria, hematuria, urinary incontinence, urinary frequency, nocturnal urination.  MS: Negative for joint pain, low back pain.  Derm: Negative for rash or itching.  Neuro: Negative for weakness, abnormal sensation, seizure, frequent headaches, memory loss,  confusion.  Psych: Negative for anxiety, depression, suicidal ideation, hallucinations.  Endo: Negative for unusual weight change.  Heme: Negative for bruising or bleeding. Allergy: Negative for rash or hives.  Physical Exam   BP 121/79   Pulse 71   Temp (!) 97.4 F (36.3 C)   Ht _0  (1.676 m)   Wt 232 lb 12.8 oz (105.6 kg)   BMI 37.57 kg/m    General: Well-nourished, well-developed in no acute distress.  Head: Normocephalic, atraumatic.   Eyes: Conjunctiva pink, no icterus. Mouth: Oropharyngeal mucosa moist and pink , no lesions erythema or exudate. Neck: Supple without thyromegaly, masses, or lymphadenopathy.  Lungs: Clear to auscultation bilaterally.  Heart: Regular rate and rhythm, no murmurs rubs or gallops.  Abdomen: Bowel sounds are normal, nontender, nondistended, no hepatosplenomegaly or masses, no abdominal bruits or hernia, no rebound or guarding.  Tenderness noted over the right anterior lower rib cage Rectal: Not performed Extremities: No lower extremity edema. No clubbing or deformities.  Neuro: Alert and oriented x 4 , grossly normal neurologically.  Skin: Warm and dry, no rash or jaundice.   Psych: Alert and cooperative, normal mood and affect.  Labs   Lab Results  Component Value Date   CREATININE 0.83 05/23/2021   BUN 15 05/23/2021   NA 142 05/23/2021   K 4.9 05/23/2021   CL 104 05/23/2021   CO2 24 05/23/2021     Lab Results  Component Value Date   TSH 2.720 01/09/2022    Imaging Studies   No results  found.  Assessment   Right upper quadrant pain: Chronic, intermittent for years.  Interestingly she has substantial postprandial component to her discomfort.  Location of pain is over the right lower anterior rib cage and reproducible with palpation.  Just this morning she consumed pretzels and noted pain in this mentioned location.  Discussed with patient, somewhat difficult to explain pain in this location this both postprandial and reproduced with palpation.  Given postprandial component, would be reasonable to consider upper endoscopy to rule out gastritis, ulcer.  If EGD is unremarkable, she would likely benefit from imaging.  We will also empirically start on PPI therapy as she has noted some improvement with Pepcid although not complete resolution.  Screening colonoscopy: History of hyperplastic polyps as outlined above.  Due for colonoscopy.   PLAN   Colonoscopy/upper endoscopy with Dr. Abbey Chatters. ASA 2.  I have discussed the risks, alternatives, benefits with regards to but not limited to the risk of reaction to medication, bleeding, infection, perforation  and the patient is agreeable to proceed. Written consent to be obtained. Start pantoprazole 72m daily before breakfast. Separate from synthroid by at least 2 hours.    Kara [name] LBobby [name] [name]

## 2022-04-05 NOTE — Telephone Encounter (Signed)
Pt instructed that she needs to have lab work prior to procedure, can go 04/24/22-04/28/22 8:30 am to 4 pm to have lab work done. Pt verbalized understanding.

## 2022-04-05 NOTE — Telephone Encounter (Signed)
Phoned and LMOVM for the pt regarding Rx being refused because she just picked up a 30 day supply.

## 2022-04-11 NOTE — Progress Notes (Signed)
Patient reached out via [facility] and stated she was going to wait until after EGD to start her pantoprazole.

## 2022-04-12 ENCOUNTER — Encounter: Payer: Self-pay | Admitting: Internal Medicine

## 2022-04-12 ENCOUNTER — Ambulatory Visit (INDEPENDENT_AMBULATORY_CARE_PROVIDER_SITE_OTHER): Payer: Medicare Other | Admitting: Internal Medicine

## 2022-04-12 VITALS — BP 123/75 | HR 75 | Ht 66.0 in | Wt 236.8 lb

## 2022-04-12 DIAGNOSIS — G609 Hereditary and idiopathic neuropathy, unspecified: Secondary | ICD-10-CM | POA: Insufficient documentation

## 2022-04-12 DIAGNOSIS — M7989 Other specified soft tissue disorders: Secondary | ICD-10-CM

## 2022-04-12 NOTE — Patient Instructions (Addendum)
Please take Vitamin B12 1000 mcg once daily.  Please use compression device at least every other day.  Please take Lasix 40 mg today to see any improvement in leg swelling.

## 2022-04-12 NOTE — Progress Notes (Signed)
Acute Office Visit  Subjective:    Patient ID: Kara [name], female    DOB: 1945/10/31, 76 y.o.   MRN: [id number]  Chief Complaint  Patient presents with   Numbness    Numbness tingling in legs and feet x 3 months feels like they are going to sleep,swelling, lymph nodes are sore     HPI Patient is in today for complaint of numbness and tingling of the bilateral LE for the last 3 months.  She has numbness in the feet, which progresses to legs and thighs at times.  She denies any recent injury or fall.  She has chronic bilateral low back pain, but denies any recent heavy lifting or frequent bending.  She has chronic leg swelling, for which she has tried compression device.  She uses it about once a week.  She feels pressure in the groin area and axillary area when she uses compression device.  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  The STAT Breast cancer panel offered by [facility] includes sequencing and rearrangement analysis for the following 9 genes:  ATM, BRCA1, BRCA2, CDH1, CHEK2, PALB2, PTEN, STK11 and TP53.   The report date is August 09, 2019.   GERD (gastroesophageal reflux disease)    H/O swallowed foreign body 11/20/2018   Headache in back of head 12/31/2018   Hip pain, acute, left 03/27/2019   HLD (hyperlipidemia) 08/23/2016   Hyperlipidemia    Hypothyroid 08/14/2016   Insomnia due to stress 12/23/2018   Malignant neoplasm  of upper-outer quadrant of left breast in female, estrogen receptor negative ([facility]) 06/11/2019   DCIS left breast   Perimenopausal vasomotor symptoms 02/11/2016   Personal history of radiation therapy 2021   completed left breast radiation in April 2021   Postmenopausal atrophic vaginitis 11/09/2015   Pre-diabetes    no meds   Prediabetes 08/30/2017   Thyroid disease    Grave's   Vaginal discharge 01/16/2019   Weight loss, unintentional 03/27/2019    Past Surgical History:  Procedure Laterality Date   ABDOMINAL HYSTERECTOMY     bleeding. fibroids   BREAST BIOPSY Left 05/2019   left breast DCIS   BREAST LUMPECTOMY Left 06/2019   DCIS   BREAST LUMPECTOMY WITH RADIOACTIVE SEED LOCALIZATION Left 07/08/2019   Procedure: LEFT BREAST LUMPECTOMY WITH RADIOACTIVE SEED LOCALIZATION;  Surgeon: Jovita Kussmaul, MD;  Location: [facility];  Service: General;  Laterality: Left;   BREAST SURGERY N/A    [employer]reesia 08/30/2020   CESAREAN SECTION     CESAREAN SECTION N/A    [employer] 08/30/2020   CHOLECYSTECTOMY     COLONOSCOPY  2011   [place]: two 3-4 mm sessile polyps, hyperplastic, sigmoid diverticula, TI appeared normal.    ESOPHAGOGASTRODUODENOSCOPY  2011   [place]: non-bleeding erosive gastropathy, normal duodenum. path: unremarkable  duodenum, negative H.pylori, minimal esophagitis    TONSILLECTOMY  1975    Family History  Problem Relation Age of Onset   Breast cancer Maternal Grandmother        dx over 64   Prostate cancer Maternal Grandfather    Arthritis Mother    COPD Mother    Breast cancer Mother 61       second at age 60   Cancer Father        throat   Stroke Father    Cancer Maternal Aunt 54       [name], cervical cancer   Breast cancer Maternal Aunt        dx over 15   Breast cancer Paternal Aunt        dx under 47   Prostate cancer Maternal Uncle        dx over 35   Cancer Other        father's maternal cousin was Henreietta Lacks, Cervical    Cancer Maternal Uncle        unknown cancer   Breast cancer Paternal Aunt        dx over 11   Colon cancer Neg Hx    Allergic rhinitis Neg Hx    Angioedema Neg Hx    Asthma Neg Hx    Atopy Neg Hx    Eczema Neg Hx    Urticaria Neg Hx    Immunodeficiency Neg Hx     Social History   Socioeconomic History   Marital status: Divorced    Spouse name: Not on file   Number of children: 2   Years of education: 15   Highest education level: Not on file  Occupational History   Occupation: retired    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  Tobacco Use   Smoking status: Former    Types: Cigarettes    Quit date: 06/15/1998    Years since quitting: 23.8   Smokeless tobacco: Never   Tobacco comments:    quit 2002  Vaping Use   Vaping Use: Never used  Substance and Sexual Activity   Alcohol use: No   Drug use: No   Sexual activity: Not Currently    Birth control/protection: Surgical    Comment: hyst  Other Topics Concern   Not on file  Social History Narrative   Lives alone   2 grown children- one in [place] and one here   Grandchildren       Moved to [place] from [place] for aging mother who is 69 with Alzheimers      Maternal Aunt is Stage manager of the [employer] call line      Enjoy: gym, time with friends       Diet: eats all food groups   Caffeine: 2 diet cokes daily    Water: 2-4 cups daily       Wears seat belt    Does not use phone while driving    Smoke and carbon Careers adviser at home   [employer]    No weapons    Social Determinants of Health   Financial Resource Strain: Low Risk  (10/06/2020)   Overall Financial Resource Strain (CARDIA)    Difficulty of Paying Living Expenses: Not hard at all  Food Insecurity: No Food Insecurity (10/06/2020)   Hunger Vital Sign    Worried About Running Out of Food in the Last Year: Never true    Ran Out of  Food in the Last Year: Never true  Transportation Needs: No Transportation Needs  (10/06/2020)   PRAPARE - Hydrologist (Medical): No    Lack of Transportation (Non-Medical): No  Physical Activity: Sufficiently Active (10/06/2020)   Exercise Vital Sign    Days of Exercise per Week: 7 days    Minutes of Exercise per Session: 30 min  Stress: No Stress Concern Present (10/06/2020)   [place]    Feeling of Stress : Not at all  Social Connections: Moderately Isolated (10/06/2020)   Social Connection and Isolation Panel [NHANES]    Frequency of Communication with Friends and Family: More than three times a week    Frequency of Social Gatherings with Friends and Family: More than three times a week    Attends Religious Services: Never    Marine scientist or Organizations: Yes    Attends Music therapist: More than 4 times per year    Marital Status: Divorced  Intimate Partner Violence: Not At Risk (12/04/2019)   Humiliation, Afraid, Rape, and Kick questionnaire    Fear of Current or Ex-Partner: No    Emotionally Abused: No    Physically Abused: No    Sexually Abused: No    Outpatient Medications Prior to Visit  Medication Sig Dispense Refill   acetaminophen (TYLENOL) 500 MG tablet Take 500 mg by mouth as needed.     albuterol (VENTOLIN HFA) 108 (90 Base) MCG/ACT inhaler INHALE 1 TO 2 PUFFS INTO THE LUNGS EVERY 6 HOURS AS NEEDED FOR WHEEZING OR SHORTNESS OF BREATH 6.7 g 2   Ascorbic Acid (VITAMIN C WITH ROSE HIPS) 500 MG tablet Take 500 mg by mouth daily.     Calcium Carb-Cholecalciferol (CALCIUM 600 + D PO) Take by mouth daily at 6 (six) AM.     cholecalciferol (VITAMIN D3) 25 MCG (1000 UNIT) tablet Take 2,000 Units by mouth daily.     furosemide (LASIX) 20 MG tablet Take 1 tablet (20 mg total) by mouth daily as needed for edema. 30 tablet 0   latanoprost (XALATAN) 0.005 % ophthalmic solution SMARTSIG:In Eye(s)     Misc Natural Products (SAMBUCUS ELDERBERRY  VITAMIN C MT) Use as directed 3,200 mg in the mouth or throat daily.     pantoprazole (PROTONIX) 40 MG tablet Take 1 tablet (40 mg total) by mouth daily before breakfast. 30 tablet 1   SYNTHROID 112 MCG tablet Take 1 tablet (112 mcg total) by mouth daily before breakfast. 90 tablet 1   No facility-administered medications prior to visit.    Allergies  Allergen Reactions   Coconut (Cocos Nucifera) Anaphylaxis   Cocos Nucifera Anaphylaxis   Other Shortness Of Breath    Covid booster pfizer     Prednisone Hypertension    Severe headache   Naproxen Other (See Comments)    headache    Review of Systems  Constitutional:  Negative for chills and fever.  HENT:  Negative for congestion, sinus pressure, sinus pain and sore throat.   Eyes:  Negative for pain and discharge.  Respiratory:  Negative for cough and shortness of breath.   Cardiovascular:  Positive for leg swelling. Negative for chest pain and palpitations.  Gastrointestinal:  Negative for abdominal pain, constipation, diarrhea, nausea and vomiting.  Endocrine: Negative for polydipsia and polyuria.  Genitourinary:  Negative for dysuria and hematuria.  Musculoskeletal:  Positive for arthralgias and back pain. Negative for neck pain and  neck stiffness.  Skin:  Negative for rash.  Neurological:  Positive for numbness (and tingling of LE). Negative for dizziness and weakness.  Psychiatric/Behavioral:  Negative for agitation and behavioral problems.        Objective:    Physical Exam Vitals reviewed.  Constitutional:      General: She is not in acute distress.    Appearance: She is obese. She is not diaphoretic.  HENT:     Head: Normocephalic and atraumatic.     Nose: Nose normal. No congestion.     Mouth/Throat:     Mouth: Mucous membranes are moist.     Pharynx: No posterior oropharyngeal erythema.  Eyes:     General: No scleral icterus.    Extraocular Movements: Extraocular movements intact.  Cardiovascular:     Rate  and Rhythm: Normal rate and regular rhythm.     Pulses: Normal pulses.     Heart sounds: Normal heart sounds. No murmur heard. Pulmonary:     Breath sounds: Normal breath sounds. No wheezing or rales.  Musculoskeletal:     Cervical back: Neck supple. No tenderness.     Right lower leg: Edema (2+) present.     Left lower leg: Edema (2+) present.  Skin:    General: Skin is warm.     Findings: No rash.  Neurological:     General: No focal deficit present.     Mental Status: She is alert and oriented to person, place, and time.     Sensory: Sensory deficit (B/l LE) present.     Motor: No weakness.  Psychiatric:        Mood and Affect: Mood normal.        Behavior: Behavior normal.     BP 123/75 (BP Location: Right Arm, Patient Position: Sitting, Cuff Size: Large)   Pulse 75   Ht _0  (1.676 m)   Wt 236 lb 12.8 oz (107.4 kg)   SpO2 97%   BMI 38.22 kg/m  Wt Readings from Last 3 Encounters:  04/12/22 236 lb 12.8 oz (107.4 kg)  04/05/22 232 lb 12.8 oz (105.6 kg)  01/13/22 238 lb (108 kg)        Assessment & Plan:   Problem List Items Addressed This Visit       Nervous and Auditory   Idiopathic peripheral neuropathy    Has an intermittent numbness and tingling of the LE up to thigh Could be from lumbar radiculopathy and/or peripheral neuropathy Advised trial of gabapentin, but she prefers to get nerve conduction study Referred to neurology      Relevant Orders   Ambulatory referral to Neurology     Other   Leg swelling - Primary    Likely related to venous insufficiency Leg elevation and compression socks Referred to PT, but had groin pain Uses compression device at home, needs to use it regularly        No orders of the defined types were placed in this encounter.    Lindell Spar, MD

## 2022-04-12 NOTE — Assessment & Plan Note (Addendum)
Likely related to venous insufficiency Leg elevation and compression socks Referred to PT, but had groin pain Uses compression device at home, needs to use it regularly

## 2022-04-12 NOTE — Assessment & Plan Note (Signed)
Has an intermittent numbness and tingling of the LE up to thigh Could be from lumbar radiculopathy and/or peripheral neuropathy Advised trial of gabapentin, but she prefers to get nerve conduction study Referred to neurology

## 2022-04-13 ENCOUNTER — Other Ambulatory Visit: Payer: Self-pay

## 2022-04-13 ENCOUNTER — Encounter: Payer: Self-pay | Admitting: Neurology

## 2022-04-13 DIAGNOSIS — E890 Postprocedural hypothyroidism: Secondary | ICD-10-CM | POA: Diagnosis not present

## 2022-04-13 DIAGNOSIS — R7303 Prediabetes: Secondary | ICD-10-CM | POA: Diagnosis not present

## 2022-04-13 DIAGNOSIS — E559 Vitamin D deficiency, unspecified: Secondary | ICD-10-CM | POA: Diagnosis not present

## 2022-04-13 DIAGNOSIS — E538 Deficiency of other specified B group vitamins: Secondary | ICD-10-CM

## 2022-04-13 DIAGNOSIS — E782 Mixed hyperlipidemia: Secondary | ICD-10-CM | POA: Diagnosis not present

## 2022-04-14 LAB — LIPID PANEL
Chol/HDL Ratio: 3.6 ratio (ref 0.0–4.4)
Cholesterol, Total: 185 mg/dL (ref 100–199)
HDL: 52 mg/dL (ref >39)
LDL Chol Calc (NIH): 120 mg/dL — ABNORMAL HIGH (ref 0–99)
Triglycerides: 70 mg/dL (ref 0–149)
VLDL Cholesterol Cal: 13 mg/dL (ref 5–40)

## 2022-04-14 LAB — VITAMIN B12: Vitamin B-12: 694 pg/mL (ref 232–1245)

## 2022-04-14 LAB — T4, FREE: Free T4: 1.32 ng/dL (ref 0.82–1.77)

## 2022-04-14 LAB — TSH: TSH: 4.42 u[IU]/mL (ref 0.450–4.500)

## 2022-04-14 LAB — VITAMIN D 25 HYDROXY (VIT D DEFICIENCY, FRACTURES): Vit D, 25-Hydroxy: 41.5 ng/mL (ref 30.0–100.0)

## 2022-04-17 ENCOUNTER — Other Ambulatory Visit: Payer: Self-pay | Admitting: General Surgery

## 2022-04-17 DIAGNOSIS — Z1231 Encounter for screening mammogram for malignant neoplasm of breast: Secondary | ICD-10-CM

## 2022-04-17 DIAGNOSIS — M79671 Pain in right foot: Secondary | ICD-10-CM | POA: Diagnosis not present

## 2022-04-17 DIAGNOSIS — M79672 Pain in left foot: Secondary | ICD-10-CM | POA: Diagnosis not present

## 2022-04-17 DIAGNOSIS — B351 Tinea unguium: Secondary | ICD-10-CM | POA: Diagnosis not present

## 2022-04-18 NOTE — Telephone Encounter (Signed)
Yes ma'am, we do have some samples.

## 2022-04-19 ENCOUNTER — Ambulatory Visit (INDEPENDENT_AMBULATORY_CARE_PROVIDER_SITE_OTHER): Payer: Medicare Other | Admitting: "Endocrinology

## 2022-04-19 ENCOUNTER — Encounter: Payer: Self-pay | Admitting: *Deleted

## 2022-04-19 ENCOUNTER — Encounter: Payer: Self-pay | Admitting: "Endocrinology

## 2022-04-19 VITALS — BP 104/78 | HR 76 | Ht 66.0 in | Wt 230.2 lb

## 2022-04-19 DIAGNOSIS — E890 Postprocedural hypothyroidism: Secondary | ICD-10-CM | POA: Diagnosis not present

## 2022-04-19 DIAGNOSIS — E559 Vitamin D deficiency, unspecified: Secondary | ICD-10-CM | POA: Diagnosis not present

## 2022-04-19 DIAGNOSIS — R7303 Prediabetes: Secondary | ICD-10-CM

## 2022-04-19 DIAGNOSIS — E782 Mixed hyperlipidemia: Secondary | ICD-10-CM

## 2022-04-19 LAB — POCT GLYCOSYLATED HEMOGLOBIN (HGB A1C): HbA1c, POC (controlled diabetic range): 6.3 % (ref 0.0–7.0)

## 2022-04-19 NOTE — Telephone Encounter (Signed)
Pt came by office, new instructions and Clenpiq sample given to pt.

## 2022-04-19 NOTE — Progress Notes (Signed)
04/19/2022, 5:55 PM                         Endocrinology follow-up note   Kara [name] is a 76 y.o.-year-old female patient being seen in follow-up for management of  RAI induced hypothyroidism referred by Lindell Spar, MD.   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  The STAT Breast cancer panel offered by [facility] includes sequencing and rearrangement analysis for the following 9 genes:  ATM, BRCA1, BRCA2, CDH1, CHEK2, PALB2, PTEN, STK11 and TP53.   The report date is August 09, 2019.   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 fibroids   BREAST BIOPSY Left 05/2019   left breast DCIS   BREAST LUMPECTOMY Left 06/2019   DCIS   BREAST LUMPECTOMY WITH RADIOACTIVE SEED LOCALIZATION Left 07/08/2019   Procedure: LEFT BREAST LUMPECTOMY WITH RADIOACTIVE SEED LOCALIZATION;  Surgeon: Jovita Kussmaul, MD;  Location: [facility];  Service: General;  Laterality: Left;   BREAST SURGERY N/A    Phreesia 08/30/2020   CESAREAN SECTION     CESAREAN SECTION N/A    Phreesia 08/30/2020   CHOLECYSTECTOMY     COLONOSCOPY  2011   [place]: two 3-4 mm sessile polyps, hyperplastic, sigmoid diverticula, TI appeared normal.    ESOPHAGOGASTRODUODENOSCOPY  2011   [place]: non-bleeding erosive gastropathy, normal duodenum. 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  Tobacco Use   Smoking status: Former    Types: Cigarettes    Quit date: 06/15/1998    Years since quitting: 23.8   Smokeless tobacco: Never   Tobacco comments:    quit 2002  Vaping Use   Vaping Use: Never used  Substance and Sexual Activity   Alcohol use: No   Drug use: No   Sexual activity: Not Currently    Birth control/protection: Surgical    Comment: hyst  Other Topics Concern   Not on file  Social History Narrative   Lives alone   2 grown children- one in [place] and one here   Grandchildren       Moved to [place] from [place] for aging mother who is 29 with Alzheimers      Maternal Aunt is Stage manager of the [employer] cancer call line      Enjoy: gym, time with friends       Diet: eats all food groups    Caffeine: 2 diet cokes daily    Water: 2-4 cups daily       Wears seat belt    Does not use phone while driving    Smoke and carbon Careers adviser at home   [employer]    No weapons    Social Determinants of Health   Financial Resource Strain: Low Risk  (10/06/2020)   Overall Financial Resource Strain (CARDIA)    Difficulty of Paying Living Expenses: Not hard at all  Food Insecurity: No Food Insecurity (10/06/2020)   Hunger Vital Sign    Worried About Running Out of Food in the Last Year: Never true    [place] in the Last Year: Never true  Transportation Needs: No Transportation Needs (10/06/2020)   PRAPARE - Hydrologist (Medical): No    Lack of Transportation (Non-Medical): No  Physical Activity: Sufficiently Active (10/06/2020)   Exercise Vital Sign    Days of Exercise per Week: 7 days    Minutes of Exercise per Session: 30 min  Stress: No Stress Concern Present (10/06/2020)   [place]    Feeling of Stress : Not at all  Social Connections: Moderately Isolated (10/06/2020)   Social Connection and Isolation Panel [NHANES]    Frequency of Communication with Friends and Family: More than three times a week    Frequency of Social Gatherings with Friends and Family: More than three times a week    Attends Religious Services: Never    Marine scientist or Organizations: Yes    Attends Music therapist: More than 4 times per year    Marital Status: Divorced    Family History  Problem Relation Age of Onset   Breast cancer Maternal Grandmother        dx over 40   Prostate cancer Maternal Grandfather    Arthritis Mother    COPD Mother    Breast cancer Mother 38  second at age 1   Cancer Father        throat   Stroke Father    Cancer Maternal Aunt 43       [name], cervical cancer   Breast cancer Maternal Aunt        dx over 46   Breast  cancer Paternal Aunt        dx under 16   Prostate cancer Maternal Uncle        dx over 55   Cancer Other        father's maternal cousin was Henreietta Lacks, Cervical   Cancer Maternal Uncle        unknown cancer   Breast cancer Paternal Aunt        dx over 48   Colon cancer Neg Hx    Allergic rhinitis Neg Hx    Angioedema Neg Hx    Asthma Neg Hx    Atopy Neg Hx    Eczema Neg Hx    Urticaria Neg Hx    Immunodeficiency Neg Hx     Outpatient Encounter Medications as of 04/19/2022  Medication Sig   acetaminophen (TYLENOL) 500 MG tablet Take 500 mg by mouth as needed.   albuterol (VENTOLIN HFA) 108 (90 Base) MCG/ACT inhaler INHALE 1 TO 2 PUFFS INTO THE LUNGS EVERY 6 HOURS AS NEEDED FOR WHEEZING OR SHORTNESS OF BREATH   Ascorbic Acid (VITAMIN C WITH ROSE HIPS) 500 MG tablet Take 500 mg by mouth daily.   Calcium Carb-Cholecalciferol (CALCIUM 600 + D PO) Take by mouth daily at 6 (six) AM.   cholecalciferol (VITAMIN D3) 25 MCG (1000 UNIT) tablet Take 2,000 Units by mouth daily.   furosemide (LASIX) 20 MG tablet Take 1 tablet (20 mg total) by mouth daily as needed for edema.   latanoprost (XALATAN) 0.005 % ophthalmic solution SMARTSIG:In Eye(s)   Misc Natural Products (SAMBUCUS ELDERBERRY VITAMIN C MT) Use as directed 3,200 mg in the mouth or throat daily.   SYNTHROID 112 MCG tablet Take 1 tablet (112 mcg total) by mouth daily before breakfast.   [DISCONTINUED] pantoprazole (PROTONIX) 40 MG tablet Take 1 tablet (40 mg total) by mouth daily before breakfast.   No facility-administered encounter medications on file as of 04/19/2022.    ALLERGIES: Allergies  Allergen Reactions   Coconut (Cocos Nucifera) Anaphylaxis   Cocos Nucifera Anaphylaxis   Other Shortness Of Breath    Covid booster pfizer     Prednisone Hypertension    Severe headache   Naproxen Other (See Comments)    headache   VACCINATION STATUS: Immunization History  Administered Date(s) Administered   Fluad  Quad(high Dose 65+) 02/11/2020, 01/20/2021, 01/25/2022   Influenza, High Dose Seasonal PF 02/08/2018, 02/17/2019   Influenza,inj,Quad PF,6+ Mos 02/28/2017   Influenza-Unspecified 02/28/2017   Moderna Covid-19 Vaccine Bivalent Booster 52yr & up 02/03/2022   PFIZER(Purple Top)SARS-COV-2 Vaccination 06/06/2019, 06/24/2019, 04/21/2020, 10/07/2020, 02/22/2021   Pneumococcal Conjugate-13 07/05/2016   Pneumococcal Polysaccharide-23 04/29/2019   Td 12/23/2018   Zoster Recombinat (Shingrix) 06/06/2021     HPI    Kara [name] is a patient with the above medical history. she was diagnosed  with hyperthyroidism at approximate age of 89years  which required I-131 thyroid ablation in environment.   -She was subsequently initiated on thyroid hormone supplement.  She is currently on Synthroid 112 mcg p.o. daily before breakfast. She reports compliance and consistency with her medication.   -she has no new complaints except the fact  that she could not lose weight.   She has prediabetes not on medication.  She has dyslipidemia, does not want to go on statins.  She has not changed her diet yet.  -She presents with steady weight since last visit.  She denies tremors, palpitations. Pt denies feeling nodules in neck, hoarseness, dysphagia/odynophagia, SOB with lying down.  she denies family history of  thyroid disorders.  No family history of thyroid cancer.  Her father had head and neck tumor which did not appear to be related to thyroid cancer.  ROS:  Limited as above.   Physical Exam: BP 104/78   Pulse 76   Ht _0  (1.676 m)   Wt 230 lb 3.2 oz (104.4 kg)   BMI 37.16 kg/m  Wt Readings from Last 3 Encounters:  04/19/22 230 lb 3.2 oz (104.4 kg)  04/12/22 236 lb 12.8 oz (107.4 kg)  04/05/22 232 lb 12.8 oz (105.6 kg)    CMP     Component Value Date/Time   NA 142 05/23/2021 1031   K 4.9 05/23/2021 1031   CL 104 05/23/2021 1031   CO2 24 05/23/2021 1031   GLUCOSE 109 (H) 05/23/2021  1031   GLUCOSE 107 (H) 03/08/2020 0935   BUN 15 05/23/2021 1031   CREATININE 0.83 05/23/2021 1031   CREATININE 0.75 03/08/2020 0935   CALCIUM 10.3 05/23/2021 1031   PROT 6.9 10/25/2020 0954   ALBUMIN 3.9 10/25/2020 0954   AST 23 10/25/2020 0954   ALT 22 10/25/2020 0954   ALKPHOS 133 (H) 10/25/2020 0954   BILITOT 0.3 10/25/2020 0954   GFRNONAA 70 10/22/2019 0958   GFRAA 82 10/22/2019 0958    Diabetic Labs (most recent): Lab Results  Component Value Date   HGBA1C 6.3 04/19/2022   HGBA1C 6.2 01/13/2022   HGBA1C 6.4 03/11/2021   MICROALBUR 0.5 08/16/2017   MICROALBUR 0.3 02/19/2017     Lipid Panel ( most recent) Lipid Panel     Component Value Date/Time   CHOL 185 04/13/2022 1003   TRIG 70 04/13/2022 1003   HDL 52 04/13/2022 1003   CHOLHDL 3.6 04/13/2022 1003   CHOLHDL 3.0 10/22/2019 0958   VLDL 15 11/24/2016 1018   LDLCALC 120 (H) 04/13/2022 1003   LDLCALC 114 (H) 10/22/2019 0958   LABVLDL 13 04/13/2022 1003     Lab Results  Component Value Date   TSH 4.420 04/13/2022   TSH 2.720 01/09/2022   TSH 0.980 07/07/2021   TSH 0.62 03/02/2021   TSH 3.340 10/25/2020   TSH 1.48 03/08/2020   TSH 0.78 09/08/2019   TSH 0.34 (L) 02/27/2019   TSH 0.52 01/15/2019   TSH 0.58 08/16/2017   FREET4 1.32 04/13/2022   FREET4 1.35 01/09/2022   FREET4 1.52 07/07/2021   FREET4 1.5 03/02/2021   FREET4 1.56 10/25/2020   FREET4 1.4 03/08/2020   FREET4 1.3 09/08/2019   FREET4 1.5 02/27/2019       ASSESSMENT: 1. Hypothyroidism- RAI induced 2.  Prediabetes- worsening 3. Hyperlipidemia 4. Vitamin d deficiency   PLAN:   Her previsit thyroid function tests are consistent with appropriate replacement.  She is advised to continue Synthroid 112 mcg p.o. daily before breakfast.    - We discussed about the correct intake of her thyroid hormone, on empty stomach at fasting, with water, separated by at least 30 minutes from breakfast and other medications,  and separated by more than 4  hours from calcium, iron, multivitamins, acid reflux medications (PPIs). -Patient is made aware of  the fact that thyroid hormone replacement is needed for life, dose to be adjusted by periodic monitoring of thyroid function tests.    -Due to absence of clinical goiter, no need for thyroid ultrasound. In light of her metabolic dysfunction indicated by prediabetes, with point-of-care A1c of 6.3% today, severe hyperlipidemia, and the fact that she was to avoid statin medications, obesity, she is a good candidate for lifestyle medicine.  She did not make optimal engagement with lifestyle medicine, however wishes to try again.  - she acknowledges that there is a room for improvement in her food and drink choices. - Suggestion is made for her to avoid simple carbohydrates  from her diet including Cakes, Sweet Desserts, Ice Cream, Soda (diet and regular), Sweet Tea, Candies, Chips, Cookies, Store Bought Juices, Alcohol , Artificial Sweeteners,  Coffee Creamer, and "Sugar-free" Products, Lemonade. This will help patient to have more stable blood glucose profile and potentially avoid unintended weight gain.  The following Lifestyle Medicine recommendations according to [facility]) were discussed and and offered to patient and she  agrees to start the journey:  A. Whole Foods, Plant-Based Nutrition comprising of fruits and vegetables, plant-based proteins, whole-grain carbohydrates was discussed in detail with the patient.   A list for source of those nutrients were also provided to the patient.  Patient will use only water or unsweetened tea for hydration. B.  The need to stay away from risky substances including alcohol, smoking; obtaining 7 to 9 hours of restorative sleep, at least 150 minutes of moderate intensity exercise weekly, the importance of healthy social connections,  and stress management techniques were discussed. C.  A full color page of  Calorie density of  various food groups per pound showing examples of each food groups was provided to the patient.   This approach will help her with weight management, prediabetes, HPL, HTN.  She is advised to maintain close follow-up with her PCP.     I spent 26 minutes in the care of the patient today including review of labs from Thyroid Function, CMP, and other relevant labs ; imaging/biopsy records (current and previous including abstractions from other facilities); face-to-face time discussing  her lab results and symptoms, medications doses, her options of short and long term treatment based on the latest standards of care / guidelines;   and documenting the encounter.  Michele Mcalpine  participated in the discussions, expressed understanding, and voiced agreement with the above plans.  All questions were answered to her satisfaction. she is encouraged to contact clinic should she have any questions or concerns prior to her return visit.    Return in about 6 months (around 10/19/2022) for Fasting Labs  in AM B4 8, A1c -NV.  Glade Lloyd, MD [facility] [address] [place], [place] [address] Phone: [phone]  Fax: [phone]   04/19/2022, 5:55 PM  This note was partially dictated with voice recognition software. Similar sounding words can be transcribed inadequately or may not  be corrected upon review.

## 2022-04-19 NOTE — Patient Instructions (Signed)

## 2022-04-24 ENCOUNTER — Other Ambulatory Visit: Payer: Self-pay | Admitting: Internal Medicine

## 2022-04-24 ENCOUNTER — Encounter: Payer: Self-pay | Admitting: Internal Medicine

## 2022-04-24 DIAGNOSIS — M7989 Other specified soft tissue disorders: Secondary | ICD-10-CM

## 2022-04-24 MED ORDER — FUROSEMIDE 40 MG PO TABS: 40.0000 mg | ORAL_TABLET | Freq: Every day | ORAL | 2 refills | Status: DC | PRN

## 2022-04-25 ENCOUNTER — Other Ambulatory Visit (HOSPITAL_COMMUNITY)
Admission: RE | Admit: 2022-04-25 | Discharge: 2022-04-25 | Disposition: A | Discharge status: Home / Self Care | Payer: Medicare Other | Source: Ambulatory Visit | Attending: Internal Medicine | Admitting: Internal Medicine

## 2022-04-25 ENCOUNTER — Encounter: Payer: Self-pay | Admitting: Internal Medicine

## 2022-04-25 ENCOUNTER — Other Ambulatory Visit: Payer: Self-pay | Admitting: *Deleted

## 2022-04-25 DIAGNOSIS — Z1211 Encounter for screening for malignant neoplasm of colon: Secondary | ICD-10-CM | POA: Insufficient documentation

## 2022-04-25 LAB — BASIC METABOLIC PANEL
Anion gap: 6 (ref 5–15)
BUN: 18 mg/dL (ref 8–23)
CO2: 26 mmol/L (ref 22–32)
Calcium: 9.2 mg/dL (ref 8.9–10.3)
Chloride: 107 mmol/L (ref 98–111)
Creatinine, Ser: 1.11 mg/dL — ABNORMAL HIGH (ref 0.44–1.00)
GFR, Estimated: 52 mL/min — ABNORMAL LOW (ref >60)
Glucose, Bld: 112 mg/dL — ABNORMAL HIGH (ref 70–99)
Potassium: 3.8 mmol/L (ref 3.5–5.1)
Sodium: 139 mmol/L (ref 135–145)

## 2022-04-25 NOTE — Progress Notes (Signed)
Initial neurology clinic note  SERVICE DATE: 04/28/22  Reason for Evaluation: Consultation requested by Kara Spar, MD for an opinion regarding numbness and tingling in legs and feet. My final recommendations will be communicated back to the requesting physician by way of shared medical record or letter to requesting physician via [place] mail.  HPI: This is Kara [name], a 76 y.o. right-handed female with a medical history of pre-diabetes, OA, anxiety, GERD, HLD, hypothyroidism (Grave's disease), left breast cancer (s/p lumpectomy without lymph node removal and radiation, but no chemotherapy) who presents to neurology clinic with the chief complaint of numbness and tingling in legs and feet. The patient is alone today.  Patient's symptoms started with the bottom of her feet tingling at night about 3 months ago (01/2022). It progressed to feet then legs feeling numb and tingling about 1 month later. She had some numbness and tingling in the groin as well. She denies that it involved genitals or anal area but more her lower abdomen. She also mentions soreness in left axilla with lumpy sensation. The symptoms resolved, but then came back this morning (04/28/22). She denies weakness in her legs. She denies imbalance or falls.  She also mentions chronic low back pain but denies radiating pain down legs.  She denies numbness or tingling in her arms. Her hands are often cold.  She has plantar fasciitis and wears inserts. She has less symptoms in the day.  She does have edema in legs and hands and on lasix. She is treated for lymphedema. She wears compression stockings for the last 20 years at least.  Patient has discussed a trial of gabapentin but patient preferred NCS and neurology referral.  Patient is having pain on her right abdomen after eating certain foods (chocolate or spicy foods cause burning sensation) for which she is having an endoscopy soon.  The patient denies symptoms  suggestive of oculobulbar weakness including diplopia, ptosis, dysphagia, poor saliva control, dysarthria/dysphonia, impaired mastication, facial weakness/droop.  There are no neuromuscular respiratory weakness symptoms, particularly orthopnea>dyspnea.   She denies any constitutional symptoms like fever, night sweats, anorexia or unintentional weight loss.  EtOH use: None  Restrictive diet? No Family history of neuropathy/myopathy/neurologic disease? no  She has never had an EMG.   MEDICATIONS:  Outpatient Encounter Medications as of 04/28/2022  Medication Sig   acetaminophen (TYLENOL) 500 MG tablet Take 500 mg by mouth as needed.   albuterol (VENTOLIN HFA) 108 (90 Base) MCG/ACT inhaler INHALE 1 TO 2 PUFFS INTO THE LUNGS EVERY 6 HOURS AS NEEDED FOR WHEEZING OR SHORTNESS OF BREATH   Ascorbic Acid (VITAMIN C WITH ROSE HIPS) 500 MG tablet Take 500 mg by mouth daily.   Calcium Carb-Cholecalciferol (CALCIUM 600 + D PO) Take by mouth daily at 6 (six) AM.   cholecalciferol (VITAMIN D3) 25 MCG (1000 UNIT) tablet Take 2,000 Units by mouth daily.   furosemide (LASIX) 40 MG tablet Take 1 tablet (40 mg total) by mouth daily as needed for edema.   Misc Natural Products (SAMBUCUS ELDERBERRY VITAMIN C MT) Use as directed 3,200 mg in the mouth or throat daily.   Netarsudil-Latanoprost (ROCKLATAN) 0.02-0.005 % SOLN Apply to eye.   SYNTHROID 112 MCG tablet Take 1 tablet (112 mcg total) by mouth daily before breakfast.   latanoprost (XALATAN) 0.005 % ophthalmic solution SMARTSIG:In Eye(s) (Patient not taking: Reported on 04/28/2022)   No facility-administered encounter medications on file as of 04/28/2022.    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  The STAT Breast cancer panel offered by [facility] includes sequencing and rearrangement analysis for the following 9 genes:  ATM, BRCA1, BRCA2, CDH1, CHEK2, PALB2, PTEN, STK11 and TP53.   The report date is August 09, 2019.   GERD (gastroesophageal reflux disease)    H/O swallowed foreign body 11/20/2018   Headache in back of head 12/31/2018   Hip pain, acute, left 03/27/2019   HLD (hyperlipidemia) 08/23/2016   Hyperlipidemia    Hypothyroid 08/14/2016   Insomnia due to stress 12/23/2018   Malignant neoplasm of upper-outer quadrant of left breast in female, estrogen receptor negative ([place]) 06/11/2019   DCIS left breast   Perimenopausal vasomotor symptoms 02/11/2016   Personal history of radiation therapy 2021   completed left breast radiation in April 2021   Postmenopausal atrophic vaginitis 11/09/2015   Pre-diabetes    no meds   Prediabetes 08/30/2017   Thyroid disease    Grave's   Vaginal discharge 01/16/2019   Weight loss, unintentional 03/27/2019    PAST SURGICAL HISTORY: Past Surgical History:  Procedure Laterality Date   ABDOMINAL HYSTERECTOMY     bleeding. fibroids   BREAST BIOPSY Left 05/2019   left breast DCIS   BREAST LUMPECTOMY Left 06/2019   DCIS   BREAST LUMPECTOMY WITH RADIOACTIVE SEED LOCALIZATION Left 07/08/2019   Procedure: LEFT BREAST LUMPECTOMY WITH RADIOACTIVE SEED LOCALIZATION;  Surgeon: Jovita Kussmaul, MD;  Location: [facility];  Service: General;  Laterality: Left;   BREAST SURGERY N/A    Phreesia 08/30/2020   CESAREAN SECTION     CESAREAN SECTION N/A    [employer]reesia 08/30/2020   CHOLECYSTECTOMY     COLONOSCOPY  2011   [place]:  two 3-4 mm sessile polyps, hyperplastic, sigmoid diverticula, TI appeared normal.    ESOPHAGOGASTRODUODENOSCOPY  2011   [place]: non-bleeding erosive gastropathy, normal duodenum. path: unremarkable duodenum, negative H.pylori, minimal esophagitis    TONSILLECTOMY  1975    ALLERGIES: Allergies  Allergen Reactions   Coconut (Cocos Nucifera) Anaphylaxis   Cocos Nucifera Anaphylaxis   Other Shortness Of Breath    Covid booster pfizer     Prednisone Hypertension    Severe headache   Naproxen Other (See Comments)    headache    FAMILY HISTORY: Family History  Problem Relation Age of Onset   Breast cancer Maternal Grandmother        dx over 28   Prostate cancer Maternal Grandfather    Arthritis Mother    COPD Mother    Breast cancer Mother 78       second at age 66   Cancer Father        throat   Stroke Father    Cancer Maternal Aunt 87       [name], cervical cancer   Breast  cancer Maternal Aunt        dx over 31   Breast cancer Paternal Aunt        dx under 66   Prostate cancer Maternal Uncle        dx over 25   Cancer Other        father's maternal cousin was Henreietta Lacks, Cervical   Cancer Maternal Uncle        unknown cancer   Breast cancer Paternal Aunt        dx over 63   Colon cancer Neg Hx    Allergic rhinitis Neg Hx    Angioedema Neg Hx    Asthma Neg Hx    Atopy Neg Hx    Eczema Neg Hx    Urticaria Neg Hx    Immunodeficiency Neg Hx     SOCIAL HISTORY: Social History   Tobacco Use   Smoking status: Former    Types: Cigarettes    Quit date: 06/15/1998    Years since quitting: 23.8   Smokeless tobacco: Never   Tobacco comments:    quit 2002  Vaping Use   Vaping Use: Never used  Substance Use Topics   Alcohol use: No   Drug use: No   Social History   Social History Narrative   Lives alone   2 grown children- one in [place] and one here   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[employer] for aging mother who is 50  with Alzheimers      Maternal Aunt is Stage manager of the [employer] call line      Enjoy: gym, time with friends       Diet: eats all food groups   Caffeine: 2 diet cokes daily    Water: 2-4 cups daily       Wears seat belt    Does not use phone while driving    Smoke and carbon detectors at home   Data processing manager    No weapons       Right Handed    Lives alone in a one story home    Retired     OBJECTIVE: PHYSICAL EXAM: BP 121/74   Pulse 86   Ht _0  (1.676 m)   Wt 231 lb (104.8 kg)   SpO2 100%   BMI 37.28 kg/m   General: General appearance: Awake and alert. No distress. Cooperative with exam.  Skin: No obvious rash or jaundice. HEENT: Atraumatic. Anicteric. Lymph nodes: Axilla feels similar bilaterally, but patient is clearly tender to palpation in left axilla Lungs: Non-labored breathing on room air  Extremities: Peripheral edema in bilateral lower extremities. Psych: Affect appropriate.  Neurological: Mental Status: Alert. Speech fluent. No pseudobulbar affect Cranial Nerves: CNII: No RAPD. Visual fields grossly intact. CNIII, IV, VI: PERRL. No nystagmus. EOMI. CN V: Facial sensation intact bilaterally to fine touch. CN VII: Facial muscles symmetric and strong. No ptosis at rest. CN VIII: Hearing grossly intact bilaterally. CN IX: No hypophonia. CN X: Palate elevates symmetrically. CN XI: Full strength shoulder shrug bilaterally. CN XII: Tongue protrusion full and midline. No atrophy or fasciculations. No significant dysarthria Motor: Tone is normal.  Individual muscle group testing (MRC grade out of 5):  Movement     Neck flexion 5    Neck extension 5     Right Left   Shoulder abduction 5 5   Elbow flexion 5 5   Elbow extension 5 5  Finger abduction - FDI 5 5   Finger abduction - ADM 5 5   Finger extension 5 5   Finger distal flexion - 2/_0 Finger distal flexion - 4/_1 Thumb flexion - FPL 5 5    Hip flexion 5 5   Hip  adduction 5 5   Hip abduction 5 5   Knee extension 5 5   Knee flexion 5 5   Dorsiflexion 5 5   Plantarflexion 5 5   Great toe extension 4+ 4+   Great toe flexion 4+ 4+     Reflexes:  Right Left   Bicep 2+ 2+   Tricep 2+ 2+   BrRad 2+ 2+   Knee 0 0   Ankle 0 0    Pathological Reflexes: Babinski: mute response bilaterally Hoffman: absent bilaterally Troemner: absent bilaterally Sensation: Pinprick: Intact in all extremities Vibration: Diminished in bilateral feet Temperature: Intact in all extremities Proprioception: Intact in bilateral great toes Coordination: Intact finger-to- nose-finger bilaterally. Romberg negative. Gait: Able to rise from chair with arms crossed unassisted. Normal, narrow-based gait.  Lab and Test Review: Internal labs: HbA1c (04/19/22): 6.3 B12 (04/13/22): 694 Vit D (04/13/22): 41.5 TSH (04/13/22): 4.420 BMP (04/25/22) significant for mildly elevated Cr 1.11   ASSESSMENT: MEERA VASCO is a 76 y.o. female who presents for evaluation of numbness in tingling in bilateral lower extremities. She has a relevant medical history of pre-diabetes, OA, anxiety, GERD, HLD, hypothyroidism, left breast cancer. Her neurological examination is pertinent for diminished sensation to vibration and areflexia in bilateral lower extremities, though this could be secondary to swelling of lower extremities. Available diagnostic data is significant for HbA1c of 6.3, B12 of 694, BMP with mildly elevated Cr. The etiology of patient's symptoms is currently unclear. While polyneuropathy is possible, the relatively fast progression of sensory symptoms to above the thighs is unusual for a typical distal symmetric polyneuropathy as could be expected with metabolic syndrome and prediabetes. I will get further lab work and EMG. I discussed that EMG could also be limited due to leg swelling, but would be worth the effort.   PLAN: -Blood work: IFE, B1 -EMG: PN protocol (L >  R) -Discuss but patient deferred treatment for numbness and tingling for now -Recommended patient discuss left axillary discomfort with PCP and breast surgeon.  -Return to clinic to be determined  The impression above as well as the plan as outlined below were extensively discussed with the patient who voiced understanding. All questions were answered to their satisfaction.  When available, results of the above investigations and possible further recommendations will be communicated to the patient via telephone/MyChart. Patient to call office if not contacted after expected testing turnaround time.   Total time spent reviewing records, interview, history/exam, documentation, and coordination of care on day of encounter:  60 min   Thank you for allowing me to participate in patient's care.  If I can answer any additional questions, I would be pleased to do so.  Kai Levins, MD   CC: Kara Spar, MD [address] [place] [address]  [place]: Referring provider: Lindell Spar, MD [address] [place],  [place] [address]

## 2022-04-27 DIAGNOSIS — H40021 Open angle with borderline findings, high risk, right eye: Secondary | ICD-10-CM | POA: Diagnosis not present

## 2022-04-27 DIAGNOSIS — H401221 Low-tension glaucoma, left eye, mild stage: Secondary | ICD-10-CM | POA: Diagnosis not present

## 2022-04-28 ENCOUNTER — Ambulatory Visit (INDEPENDENT_AMBULATORY_CARE_PROVIDER_SITE_OTHER): Payer: Medicare Other | Admitting: Neurology

## 2022-04-28 ENCOUNTER — Other Ambulatory Visit (INDEPENDENT_AMBULATORY_CARE_PROVIDER_SITE_OTHER): Payer: Medicare Other

## 2022-04-28 ENCOUNTER — Encounter: Payer: Self-pay | Admitting: Neurology

## 2022-04-28 VITALS — BP 121/74 | HR 86 | Ht 66.0 in | Wt 231.0 lb

## 2022-04-28 DIAGNOSIS — R7303 Prediabetes: Secondary | ICD-10-CM

## 2022-04-28 DIAGNOSIS — M79622 Pain in left upper arm: Secondary | ICD-10-CM

## 2022-04-28 DIAGNOSIS — R209 Unspecified disturbances of skin sensation: Secondary | ICD-10-CM

## 2022-04-28 DIAGNOSIS — R202 Paresthesia of skin: Secondary | ICD-10-CM

## 2022-04-28 DIAGNOSIS — R200 Anesthesia of skin: Secondary | ICD-10-CM

## 2022-04-28 DIAGNOSIS — D0512 Intraductal carcinoma in situ of left breast: Secondary | ICD-10-CM

## 2022-04-28 NOTE — Patient Instructions (Addendum)
I saw you today for numbness and tingling in legs and feet. This could be due to nerve damage (neuropathy) or could be due to your leg swelling.  I want to investigate further with blood work today and a muscle and nerve test (EMG) that you can schedule today. See more information below about this procedure.  I will be in touch when I have your results to discuss results and next steps.  I do think you should discuss the left arm pit discomfort with your primary care doctor and breast surgeon.  The physicians and staff at [facility] are committed to providing excellent care. You may receive a survey requesting feedback about your experience at our office. We strive to receive "very good" responses to the survey questions. If you feel that your experience would prevent you from giving the office a "very good " response, please contact our office to try to remedy the situation. We may be reached at [phone]. Thank you for taking the time out of your busy day to complete the survey.  Kai Levins, MD [facility]   ELECTROMYOGRAM AND NERVE CONDUCTION STUDIES (EMG/NCS) INSTRUCTIONS  How to Prepare The neurologist conducting the EMG will need to know if you have certain medical conditions. Tell the neurologist and other EMG lab personnel if you: Have a pacemaker or any other electrical medical device Take blood-thinning medications Have hemophilia, a blood-clotting disorder that causes prolonged bleeding Bathing Take a shower or bath shortly before your exam in order to remove oils from your skin. Don't apply lotions or creams before the exam.  What to Expect You'll likely be asked to change into a hospital gown for the procedure and lie down on an examination table. The following explanations can help you understand what will happen during the exam.  Electrodes. The neurologist or a technician places surface electrodes at various locations on your skin depending on where you're  experiencing symptoms. Or the neurologist may insert needle electrodes at different sites depending on your symptoms.  Sensations. The electrodes will at times transmit a tiny electrical current that you may feel as a twinge or spasm. The needle electrode may cause discomfort or pain that usually ends shortly after the needle is removed. If you are concerned about discomfort or pain, you may want to talk to the neurologist about taking a short break during the exam.  Instructions. During the needle EMG, the neurologist will assess whether there is any spontaneous electrical activity when the muscle is at rest - activity that isn't present in healthy muscle tissue - and the degree of activity when you slightly contract the muscle.  He or she will give you instructions on resting and contracting a muscle at appropriate times. Depending on what muscles and nerves the neurologist is examining, he or she may ask you to change positions during the exam.  After your EMG You may experience some temporary, minor bruising where the needle electrode was inserted into your muscle. This bruising should fade within several days. If it persists, contact your primary care doctor.

## 2022-05-01 ENCOUNTER — Telehealth: Payer: Self-pay

## 2022-05-01 DIAGNOSIS — H401133 Primary open-angle glaucoma, bilateral, severe stage: Secondary | ICD-10-CM | POA: Diagnosis not present

## 2022-05-01 NOTE — Telephone Encounter (Signed)
Patient called triage to cancel upcoming appt. Made patient aware that her appt was cancel and patient voiced understanding.

## 2022-05-02 ENCOUNTER — Encounter (HOSPITAL_COMMUNITY): Payer: Self-pay

## 2022-05-02 ENCOUNTER — Encounter (HOSPITAL_COMMUNITY)
Admission: RE | Disposition: A | Discharge status: Home / Self Care | Payer: Self-pay | Source: Ambulatory Visit | Attending: Internal Medicine

## 2022-05-02 ENCOUNTER — Ambulatory Visit (HOSPITAL_COMMUNITY): Payer: Medicare Other | Admitting: Anesthesiology

## 2022-05-02 ENCOUNTER — Ambulatory Visit (HOSPITAL_COMMUNITY)
Admission: RE | Admit: 2022-05-02 | Discharge: 2022-05-02 | Disposition: A | Discharge status: Home / Self Care | Payer: Medicare Other | Source: Ambulatory Visit | Attending: Internal Medicine | Admitting: Internal Medicine

## 2022-05-02 ENCOUNTER — Ambulatory Visit (HOSPITAL_BASED_OUTPATIENT_CLINIC_OR_DEPARTMENT_OTHER): Payer: Medicare Other | Admitting: Anesthesiology

## 2022-05-02 ENCOUNTER — Other Ambulatory Visit: Payer: Self-pay

## 2022-05-02 DIAGNOSIS — K2970 Gastritis, unspecified, without bleeding: Secondary | ICD-10-CM

## 2022-05-02 DIAGNOSIS — Z8719 Personal history of other diseases of the digestive system: Secondary | ICD-10-CM | POA: Diagnosis not present

## 2022-05-02 DIAGNOSIS — Z853 Personal history of malignant neoplasm of breast: Secondary | ICD-10-CM | POA: Insufficient documentation

## 2022-05-02 DIAGNOSIS — K2950 Unspecified chronic gastritis without bleeding: Secondary | ICD-10-CM | POA: Diagnosis not present

## 2022-05-02 DIAGNOSIS — K635 Polyp of colon: Secondary | ICD-10-CM

## 2022-05-02 DIAGNOSIS — G8929 Other chronic pain: Secondary | ICD-10-CM | POA: Insufficient documentation

## 2022-05-02 DIAGNOSIS — D122 Benign neoplasm of ascending colon: Secondary | ICD-10-CM | POA: Diagnosis not present

## 2022-05-02 DIAGNOSIS — E039 Hypothyroidism, unspecified: Secondary | ICD-10-CM | POA: Diagnosis not present

## 2022-05-02 DIAGNOSIS — K2980 Duodenitis without bleeding: Secondary | ICD-10-CM | POA: Diagnosis not present

## 2022-05-02 DIAGNOSIS — K222 Esophageal obstruction: Secondary | ICD-10-CM | POA: Diagnosis not present

## 2022-05-02 DIAGNOSIS — D123 Benign neoplasm of transverse colon: Secondary | ICD-10-CM | POA: Diagnosis not present

## 2022-05-02 DIAGNOSIS — Z1211 Encounter for screening for malignant neoplasm of colon: Secondary | ICD-10-CM

## 2022-05-02 DIAGNOSIS — K5730 Diverticulosis of large intestine without perforation or abscess without bleeding: Secondary | ICD-10-CM | POA: Diagnosis not present

## 2022-05-02 DIAGNOSIS — Z9049 Acquired absence of other specified parts of digestive tract: Secondary | ICD-10-CM | POA: Diagnosis not present

## 2022-05-02 DIAGNOSIS — Z87891 Personal history of nicotine dependence: Secondary | ICD-10-CM | POA: Diagnosis not present

## 2022-05-02 DIAGNOSIS — K449 Diaphragmatic hernia without obstruction or gangrene: Secondary | ICD-10-CM | POA: Insufficient documentation

## 2022-05-02 DIAGNOSIS — K319 Disease of stomach and duodenum, unspecified: Secondary | ICD-10-CM | POA: Diagnosis not present

## 2022-05-02 DIAGNOSIS — K5900 Constipation, unspecified: Secondary | ICD-10-CM | POA: Diagnosis not present

## 2022-05-02 DIAGNOSIS — K2100 Gastro-esophageal reflux disease with esophagitis, without bleeding: Secondary | ICD-10-CM | POA: Diagnosis not present

## 2022-05-02 DIAGNOSIS — D120 Benign neoplasm of cecum: Secondary | ICD-10-CM | POA: Insufficient documentation

## 2022-05-02 DIAGNOSIS — K3189 Other diseases of stomach and duodenum: Secondary | ICD-10-CM | POA: Insufficient documentation

## 2022-05-02 DIAGNOSIS — Z79890 Hormone replacement therapy (postmenopausal): Secondary | ICD-10-CM | POA: Insufficient documentation

## 2022-05-02 DIAGNOSIS — Z79899 Other long term (current) drug therapy: Secondary | ICD-10-CM | POA: Insufficient documentation

## 2022-05-02 DIAGNOSIS — K2990 Gastroduodenitis, unspecified, without bleeding: Secondary | ICD-10-CM | POA: Diagnosis not present

## 2022-05-02 HISTORY — PX: ESOPHAGOGASTRODUODENOSCOPY (EGD) WITH PROPOFOL: SHX5813

## 2022-05-02 HISTORY — PX: POLYPECTOMY: SHX5525

## 2022-05-02 HISTORY — PX: BIOPSY: SHX5522

## 2022-05-02 HISTORY — PX: COLONOSCOPY WITH PROPOFOL: SHX5780

## 2022-05-02 SURGERY — COLONOSCOPY WITH PROPOFOL
Anesthesia: General

## 2022-05-02 MED ORDER — STERILE WATER FOR IRRIGATION IR SOLN
Status: DC | PRN
  Administered 2022-05-02: 120 mL

## 2022-05-02 MED ORDER — LACTATED RINGERS IV SOLN: INTRAVENOUS | Status: DC

## 2022-05-02 MED ORDER — LIDOCAINE HCL (CARDIAC) PF 100 MG/5ML IV SOSY
PREFILLED_SYRINGE | INTRAVENOUS | Status: DC | PRN
  Administered 2022-05-02: 50 mg via INTRAVENOUS

## 2022-05-02 MED ORDER — PROPOFOL 10 MG/ML IV BOLUS
INTRAVENOUS | Status: DC | PRN
  Administered 2022-05-02: 100 mg via INTRAVENOUS

## 2022-05-02 MED ORDER — PROPOFOL 500 MG/50ML IV EMUL
INTRAVENOUS | Status: DC | PRN
  Administered 2022-05-02: 150 ug/kg/min via INTRAVENOUS

## 2022-05-02 NOTE — Op Note (Signed)
[facility] Patient Name: Kara [name] Procedure Date: 05/02/2022 11:51 AM MRN: [id number] Date of Birth: 1945-06-02 Attending MD: Elon Alas. Abbey Chatters , [place], [id number] CSN: [id number] Age: 76 Admit Type: Outpatient Procedure:                Upper GI endoscopy Indications:              Abdominal pain in the right upper quadrant Providers:                Elon Alas. Abbey Chatters, DO, Caprice Kluver, Raphael Gibney,                            Technician Referring MD:              Medicines:                See the Anesthesia note for documentation of the                            administered medications Complications:            No immediate complications. Estimated Blood Loss:     Estimated blood loss was minimal. Procedure:                Pre-Anesthesia Assessment:                           - The anesthesia plan was to use monitored                            anesthesia care (MAC).                           After obtaining informed consent, the endoscope was                            passed under direct vision. Throughout the                            procedure, the patient's blood pressure, pulse, and                            oxygen saturations were monitored continuously. The                            [id number]-H190 ([id number]) scope was introduced through the                            mouth, and advanced to the second part of duodenum.                            The upper GI endoscopy was accomplished without                            difficulty. The patient tolerated the procedure                            well.  Scope In: 11:59:59 AM Scope Out: 12:05:30 PM Total Procedure Duration: 0 hours 5 minutes 31 seconds  Findings:      A 2 cm hiatal hernia was present.      A mild Schatzki ring was found in the distal esophagus.      Localized moderate inflammation characterized by erosions and erythema       was found in the gastric antrum. Biopsies were taken with a cold forceps       for  Helicobacter pylori testing.      The duodenal bulb, first portion of the duodenum and second portion of       the duodenum were normal.      Congested/bulging ampulla. Biopsies were taken with a cold forceps for       histology. Impression:               - 2 cm hiatal hernia.                           - Mild Schatzki ring.                           - Gastritis. Biopsied.                           - Normal duodenal bulb, first portion of the                            duodenum and second portion of the duodenum.                           - Congested duodenal mucosa. Biopsied. Moderate Sedation:      Per Anesthesia Care Recommendation:           - Patient has a contact number available for                            emergencies. The signs and symptoms of potential                            delayed complications were discussed with the                            patient. Return to normal activities tomorrow.                            Written discharge instructions were provided to the                            patient.                           - Resume previous diet.                           - Continue present medications.                           - Await pathology results.                           -  Use Protonix (pantoprazole) 40 mg PO daily.                           - Return to GI clinic in 3 months. Procedure Code(s):        --- Professional ---                           [phone], Esophagogastroduodenoscopy, flexible,                            transoral; with biopsy, single or multiple Diagnosis Code(s):        --- Professional ---                           K44.9, Diaphragmatic hernia without obstruction or                            gangrene                           K22.2, Esophageal obstruction                           K29.70, Gastritis, unspecified, without bleeding                           K31.89, Other diseases of stomach and duodenum                           R10.11,  Right upper quadrant pain CPT copyright 2022 American Medical Association. All rights reserved. The codes documented in this report are preliminary and upon coder review may  be revised to meet current compliance requirements. Elon Alas. Abbey Chatters, DO [place] Abbey Chatters, DO 05/02/2022 12:08:43 PM This report has been signed electronically. Number of Addenda: 0

## 2022-05-02 NOTE — Op Note (Signed)
[facility] Patient Name: Kara [name] Procedure Date: 05/02/2022 11:51 AM MRN: [id number] Date of Birth: 1946-01-31 Attending MD: Elon Alas. Abbey Chatters , [place], [id number] CSN: [id number] Age: 76 Admit Type: Outpatient Procedure:                Colonoscopy Indications:              Screening for colorectal malignant neoplasm Providers:                Elon Alas. Abbey Chatters, DO, Caprice Kluver, Raphael Gibney,                            Technician Referring MD:              Medicines:                See the Anesthesia note for documentation of the                            administered medications Complications:            No immediate complications. Estimated Blood Loss:     Estimated blood loss was minimal. Procedure:                Pre-Anesthesia Assessment:                           - The anesthesia plan was to use monitored                            anesthesia care (MAC).                           After obtaining informed consent, the colonoscope                            was passed under direct vision. Throughout the                            procedure, the patient's blood pressure, pulse, and                            oxygen saturations were monitored continuously. The                            PCF-HQ190L ([id number]) scope was introduced through                            the anus and advanced to the the cecum, identified                            by appendiceal orifice and ileocecal valve. The                            colonoscopy was performed without difficulty. The                            patient tolerated the procedure well. The  quality                            of the bowel preparation was evaluated using the                            BBPS [facility] Bowel Preparation Scale) with scores                            of: Right Colon = 3, Transverse Colon = 3 and Left                            Colon = 3 (entire mucosa seen well with no residual                             staining, small fragments of stool or opaque                            liquid). The total BBPS score equals 9. Scope In: 12:10:37 PM Scope Out: 12:24:26 PM Total Procedure Duration: 0 hours 13 minutes 49 seconds  Findings:      The perianal and digital rectal examinations were normal.      Multiple medium-mouthed diverticula were found in the sigmoid colon.      A 2 mm polyp was found in the cecum. The polyp was sessile. The polyp       was removed with a cold biopsy forceps. Resection and retrieval were       complete.      Two sessile polyps were found in the ascending colon. The polyps were 4       to 6 mm in size. These polyps were removed with a cold snare. Resection       and retrieval were complete.      Two sessile polyps were found in the transverse colon. The polyps were 3       to 5 mm in size. These polyps were removed with a cold snare. Resection       and retrieval were complete.      The exam was otherwise without abnormality. Impression:               - Diverticulosis in the sigmoid colon.                           - One 2 mm polyp in the cecum, removed with a cold                            biopsy forceps. Resected and retrieved.                           - Two 4 to 6 mm polyps in the ascending colon,                            removed with a cold snare. Resected and retrieved.                           -  Two 3 to 5 mm polyps in the transverse colon,                            removed with a cold snare. Resected and retrieved.                           - The examination was otherwise normal. Moderate Sedation:      Per Anesthesia Care Recommendation:           - Patient has a contact number available for                            emergencies. The signs and symptoms of potential                            delayed complications were discussed with the                            patient. Return to normal activities tomorrow.                            Written discharge  instructions were provided to the                            patient.                           - Resume previous diet.                           - Continue present medications.                           - Await pathology results.                           - Repeat colonoscopy in 3 - 5 years for                            surveillance.                           - Return to GI clinic in 3 months. Procedure Code(s):        --- Professional ---                           [id number], Colonoscopy, flexible; with removal of                            tumor(s), polyp(s), or other lesion(s) by snare                            technique                           70488, 12, Colonoscopy, flexible; with biopsy,  single or multiple Diagnosis Code(s):        --- Professional ---                           Z12.11, Encounter for screening for malignant                            neoplasm of colon                           D12.0, Benign neoplasm of cecum                           D12.2, Benign neoplasm of ascending colon                           D12.3, Benign neoplasm of transverse colon (hepatic                            flexure or splenic flexure)                           K57.30, Diverticulosis of large intestine without                            perforation or abscess without bleeding CPT copyright 2022 American Medical Association. All rights reserved. The codes documented in this report are preliminary and upon coder review may  be revised to meet current compliance requirements. Elon Alas. Abbey Chatters, DO [place] Abbey Chatters, DO 05/02/2022 12:32:21 PM This report has been signed electronically. Number of Addenda: 0

## 2022-05-02 NOTE — Transfer of Care (Signed)
Immediate Anesthesia Transfer of Care Note  Patient: WHITTNEY STEENSON  Procedure(s) Performed: COLONOSCOPY WITH PROPOFOL ESOPHAGOGASTRODUODENOSCOPY (EGD) WITH PROPOFOL BIOPSY POLYPECTOMY  Patient Location: Endoscopy Unit  Anesthesia Type:General  Level of Consciousness: awake  Airway & Oxygen Therapy: Patient Spontanous Breathing  Post-op Assessment: Report given to RN and Post -op Vital signs reviewed and stable  Post vital signs: Reviewed and stable  Last Vitals:  Vitals Value Taken Time  BP 93/43 05/02/22 1228  Temp    Pulse 69 05/02/22 1228  Resp 17 05/02/22 1228  SpO2 99 % 05/02/22 1228  Vitals shown include unvalidated device data.  Last Pain:  Vitals:   05/02/22 1157  TempSrc:   PainSc: 3       Patients Stated Pain Goal: 7 (32/76/14 7092)  Complications: No notable events documented.

## 2022-05-02 NOTE — Anesthesia Preprocedure Evaluation (Signed)
Anesthesia Evaluation  Patient identified by MRN, date of birth, ID band Patient awake    Reviewed: Allergy & Precautions, H&P , NPO status , Patient's Chart, lab work & pertinent test results, reviewed documented beta blocker date and time   Airway Mallampati: II  TM Distance: >3 FB Neck ROM: full    Dental no notable dental hx.    Pulmonary neg pulmonary ROS, former smoker   Pulmonary exam normal breath sounds clear to auscultation       Cardiovascular Exercise Tolerance: Good negative cardio ROS  Rhythm:regular Rate:Normal     Neuro/Psych  Headaches  Anxiety      Neuromuscular disease  negative psych ROS   GI/Hepatic Neg liver ROS,GERD  Medicated,,  Endo/Other  Hypothyroidism    Renal/GU negative Renal ROS  negative genitourinary   Musculoskeletal   Abdominal   Peds  Hematology  (+) Blood dyscrasia, anemia   Anesthesia Other Findings   Reproductive/Obstetrics negative OB ROS                             Anesthesia Physical Anesthesia Plan  ASA: 3  Anesthesia Plan: General   Post-op Pain Management:    Induction:   PONV Risk Score and Plan: Propofol infusion  Airway Management Planned:   Additional Equipment:   Intra-op Plan:   Post-operative Plan:   Informed Consent: I have reviewed the patients History and Physical, chart, labs and discussed the procedure including the risks, benefits and alternatives for the proposed anesthesia with the patient or authorized representative who has indicated his/her understanding and acceptance.     Dental Advisory Given  Plan Discussed with: CRNA  Anesthesia Plan Comments:        Anesthesia Quick Evaluation

## 2022-05-02 NOTE — Interval H&P Note (Signed)
History and Physical Interval Note:  05/02/2022 11:49 AM  Kara [name]  has presented today for surgery, with the diagnosis of RUQ pain,screening colon cancer.  The various methods of treatment have been discussed with the patient and family. After consideration of risks, benefits and other options for treatment, the patient has consented to  Procedure(s) with comments: COLONOSCOPY WITH PROPOFOL (N/A) - 11:30 am ESOPHAGOGASTRODUODENOSCOPY (EGD) WITH PROPOFOL (N/A) as a surgical intervention.  The patient's history has been reviewed, patient examined, no change in status, stable for surgery.  I have reviewed the patient's chart and labs.  Questions were answered to the patient's satisfaction.     Eloise Harman

## 2022-05-02 NOTE — Discharge Instructions (Addendum)
EGD Discharge instructions Please read the instructions outlined below and refer to this sheet in the next few weeks. These discharge instructions provide you with general information on caring for yourself after you leave the hospital. Your doctor may also give you specific instructions. While your treatment has been planned according to the most current medical practices available, unavoidable complications occasionally occur. If you have any problems or questions after discharge, please call your doctor. ACTIVITY You may resume your regular activity but move at a slower pace for the next 24 hours.  Take frequent rest periods for the next 24 hours.  Walking will help expel (get rid of) the air and reduce the bloated feeling in your abdomen.  No driving for 24 hours (because of the anesthesia (medicine) used during the test).  You may shower.  Do not sign any important legal documents or operate any machinery for 24 hours (because of the anesthesia used during the test).  NUTRITION Drink plenty of fluids.  You may resume your normal diet.  Begin with a light meal and progress to your normal diet.  Avoid alcoholic beverages for 24 hours or as instructed by your caregiver.  MEDICATIONS You may resume your normal medications unless your caregiver tells you otherwise.  WHAT YOU CAN EXPECT TODAY You may experience abdominal discomfort such as a feeling of fullness or "gas" pains.  FOLLOW-UP Your doctor will discuss the results of your test with you.  SEEK IMMEDIATE MEDICAL ATTENTION IF ANY OF THE FOLLOWING OCCUR: Excessive nausea (feeling sick to your stomach) and/or vomiting.  Severe abdominal pain and distention (swelling).  Trouble swallowing.  Temperature over 101 F (37.8 C).  Rectal bleeding or vomiting of blood.     Colonoscopy Discharge Instructions  Read the instructions outlined below and refer to this sheet in the next few weeks. These discharge instructions provide you with  general information on caring for yourself after you leave the hospital. Your doctor may also give you specific instructions. While your treatment has been planned according to the most current medical practices available, unavoidable complications occasionally occur.   ACTIVITY You may resume your regular activity, but move at a slower pace for the next 24 hours.  Take frequent rest periods for the next 24 hours.  Walking will help get rid of the air and reduce the bloated feeling in your belly (abdomen).  No driving for 24 hours (because of the medicine (anesthesia) used during the test).   Do not sign any important legal documents or operate any machinery for 24 hours (because of the anesthesia used during the test).  NUTRITION Drink plenty of fluids.  You may resume your normal diet as instructed by your doctor.  Begin with a light meal and progress to your normal diet. Heavy or fried foods are harder to digest and may make you feel sick to your stomach (nauseated).  Avoid alcoholic beverages for 24 hours or as instructed.  MEDICATIONS You may resume your normal medications unless your doctor tells you otherwise.  WHAT YOU CAN EXPECT TODAY Some feelings of bloating in the abdomen.  Passage of more gas than usual.  Spotting of blood in your stool or on the toilet paper.  IF YOU HAD POLYPS REMOVED DURING THE COLONOSCOPY: No aspirin products for 7 days or as instructed.  No alcohol for 7 days or as instructed.  Eat a soft diet for the next 24 hours.  FINDING OUT THE RESULTS OF YOUR TEST Not all test results are  available during your visit. If your test results are not back during the visit, make an appointment with your caregiver to find out the results. Do not assume everything is normal if you have not heard from your caregiver or the medical facility. It is important for you to follow up on all of your test results.  SEEK IMMEDIATE MEDICAL ATTENTION IF: You have more than a spotting of  blood in your stool.  Your belly is swollen (abdominal distention).  You are nauseated or vomiting.  You have a temperature over 101.  You have abdominal pain or discomfort that is severe or gets worse throughout the day.   Your EGD revealed mild amount inflammation in your stomach.  I took biopsies of this to rule out infection with a bacteria called H. pylori.  Await pathology results, my office will contact you.  I also took samples of your small bowel.    I want you to start taking your pantoprazole 40 mg daily for at least 12 weeks.  Limit NSAID use.  Your colonoscopy revealed 5 polyp(s) which I removed successfully. Await pathology results, my office will contact you. I recommend repeating colonoscopy in 3-5 years for surveillance purposes.   You also have diverticulosis and internal hemorrhoids. I would recommend increasing fiber in your diet or adding OTC Benefiber/Metamucil. Be sure to drink at least 4 to 6 glasses of water daily.   Follow-up with GI in 2-3 months     I hope you have a great rest of your week!  Elon Alas. Abbey Chatters, D.O. Gastroenterology and Hepatology [facility]

## 2022-05-03 LAB — IMMUNOFIXATION ELECTROPHORESIS
IgG (Immunoglobin G), Serum: 1683 mg/dL — ABNORMAL HIGH (ref 600–1540)
IgM, Serum: 66 mg/dL (ref 50–300)
Immunoglobulin A: 156 mg/dL (ref 70–320)

## 2022-05-03 LAB — SURGICAL PATHOLOGY

## 2022-05-03 LAB — VITAMIN B1: Vitamin B1 (Thiamine): 7 nmol/L — ABNORMAL LOW (ref 8–30)

## 2022-05-04 ENCOUNTER — Telehealth: Payer: Self-pay

## 2022-05-04 NOTE — Telephone Encounter (Signed)
-----   Message from Shellia Carwin, MD sent at 05/04/2022  7:44 AM EST ----- Can we call patient and let her know that her B1 (thiamine) level is low? She should take B1 (thiamine) 100 mg daily. She can get this over the counter at any drug store or online.  Thank you,  Kai Levins, MD

## 2022-05-04 NOTE — Telephone Encounter (Signed)
Pt called informed her B1 (thiamine) level is low? She should take B1 (thiamine) 100 mg daily. She can get this over the counter at any drug store or online. Pt verbalized understanding

## 2022-05-05 NOTE — Anesthesia Postprocedure Evaluation (Signed)
Anesthesia Post Note  Patient: KARIMA CARRELL  Procedure(s) Performed: COLONOSCOPY WITH PROPOFOL ESOPHAGOGASTRODUODENOSCOPY (EGD) WITH PROPOFOL BIOPSY POLYPECTOMY  Patient location during evaluation: Phase II Anesthesia Type: General Level of consciousness: awake Pain management: pain level controlled Vital Signs Assessment: post-procedure vital signs reviewed and stable Respiratory status: spontaneous breathing and respiratory function stable Cardiovascular status: blood pressure returned to baseline and stable Postop Assessment: no headache and no apparent nausea or vomiting Anesthetic complications: no Comments: Late entry   No notable events documented.   Last Vitals:  Vitals:   05/02/22 1230 05/02/22 1232  BP: (!) 105/47 (!) 105/47  Pulse: 69 69  Resp: 20 18  Temp:    SpO2: 98% 99%    Last Pain:  Vitals:   05/02/22 1230  TempSrc:   PainSc: 0-No pain                 Louann Sjogren

## 2022-05-10 ENCOUNTER — Encounter (HOSPITAL_COMMUNITY): Payer: Self-pay | Admitting: Internal Medicine

## 2022-05-12 ENCOUNTER — Ambulatory Visit: Payer: Medicare Other | Admitting: Internal Medicine

## 2022-05-15 HISTORY — PX: REFRACTIVE SURGERY: SHX103

## 2022-05-17 ENCOUNTER — Ambulatory Visit: Payer: Medicare Other | Admitting: Urology

## 2022-05-23 ENCOUNTER — Other Ambulatory Visit: Payer: Self-pay | Admitting: "Endocrinology

## 2022-05-24 ENCOUNTER — Ambulatory Visit: Payer: Medicare Other | Admitting: Neurology

## 2022-06-01 ENCOUNTER — Other Ambulatory Visit: Payer: Self-pay | Admitting: Gastroenterology

## 2022-06-01 NOTE — Telephone Encounter (Signed)
FYI:  Pt was advised of Rx being refilled with additional refills. Pt states she was advised by Dr Abbey Chatters to only do it for 12 weeks. Pt stated she will discuss further with Neil Crouch at her next visit because she doesn't have indigestion or reflux

## 2022-06-12 ENCOUNTER — Ambulatory Visit (INDEPENDENT_AMBULATORY_CARE_PROVIDER_SITE_OTHER): Payer: Medicare Other | Admitting: Neurology

## 2022-06-12 DIAGNOSIS — D0512 Intraductal carcinoma in situ of left breast: Secondary | ICD-10-CM

## 2022-06-12 DIAGNOSIS — R209 Unspecified disturbances of skin sensation: Secondary | ICD-10-CM

## 2022-06-12 DIAGNOSIS — R7303 Prediabetes: Secondary | ICD-10-CM

## 2022-06-12 DIAGNOSIS — R200 Anesthesia of skin: Secondary | ICD-10-CM

## 2022-06-12 DIAGNOSIS — M79622 Pain in left upper arm: Secondary | ICD-10-CM

## 2022-06-12 NOTE — Procedures (Signed)
  [facility]  [address]  [place], [place] [address] Tel: [phone] Fax: [phone] Test Date:  06/12/2022  Patient: Kara [name] DOB: 06-Feb-1946 Physician: Kai Levins, MD  Sex: Female Height: '5\' 6"'$  Ref Phys: Kai Levins, MD  ID#: [id number]   Technician:    History: This is a 77 year old female with numbness and tingling in bilateral lower extremities.  NCV & EMG Findings: This is an incomplete electrodiagnostic studies of the left upper and lower limb. The study was stopped early due to patient discomfort. The limited evaluation shows: Left sural and superficial peroneal/fibular sensory responses are absent, though lower extremity edema and patient discomfort did not allow for maximal stimulation. Left median sensory response is within normal limits.   Impression: This is an incomplete electrodiagnostic evaluation that was stopped early due to patient discomfort. No definitive conclusions can be made from this very limited evaluation. The absent responses in the lower extremity may be technical in nature due to significant peripheral edema and inability to achieve maximal stimulation.    ___________________________ Kai Levins, MD    Nerve Conduction Studies Sensory Sites    Neg Peak Lat Amplitude (O-P) Segment Distance Velocity Comment  Site (ms) Norm (V) Norm  (cm) (ms)   Left Median Sensory  Wrist 2.8  < 3.8 46  > 10 Wrist-Dig II 13    Left Superficial Fibular Sensory  14 cm-Ankle *NR  < 4.6 *NR  > 3 14 cm-Ankle 14    Left Sural Sensory  Calf-Lat mall *NR  < 4.6 *NR  > 3 Calf-Lat mall 14      Waveforms:  Sensory

## 2022-06-12 NOTE — Telephone Encounter (Signed)
noted 

## 2022-06-13 ENCOUNTER — Ambulatory Visit
Admission: RE | Admit: 2022-06-13 | Discharge: 2022-06-13 | Disposition: A | Discharge status: Home / Self Care | Payer: Medicare Other | Source: Ambulatory Visit | Attending: General Surgery | Admitting: General Surgery

## 2022-06-13 DIAGNOSIS — Z1231 Encounter for screening mammogram for malignant neoplasm of breast: Secondary | ICD-10-CM

## 2022-06-20 DIAGNOSIS — Z961 Presence of intraocular lens: Secondary | ICD-10-CM | POA: Diagnosis not present

## 2022-06-20 DIAGNOSIS — H25811 Combined forms of age-related cataract, right eye: Secondary | ICD-10-CM | POA: Diagnosis not present

## 2022-06-20 DIAGNOSIS — H40003 Preglaucoma, unspecified, bilateral: Secondary | ICD-10-CM | POA: Diagnosis not present

## 2022-06-26 ENCOUNTER — Encounter: Payer: Medicare Other | Admitting: Neurology

## 2022-06-28 ENCOUNTER — Encounter: Payer: Self-pay | Admitting: Internal Medicine

## 2022-06-28 DIAGNOSIS — H401221 Low-tension glaucoma, left eye, mild stage: Secondary | ICD-10-CM | POA: Diagnosis not present

## 2022-06-28 DIAGNOSIS — H4720 Unspecified optic atrophy: Secondary | ICD-10-CM | POA: Diagnosis not present

## 2022-06-28 DIAGNOSIS — H40021 Open angle with borderline findings, high risk, right eye: Secondary | ICD-10-CM | POA: Diagnosis not present

## 2022-06-28 LAB — HM DIABETES EYE EXAM

## 2022-07-02 NOTE — Progress Notes (Unsigned)
GI Office Note    Referring Provider: Lindell Spar, MD Primary Care Physician:  Lindell Spar, MD  Primary Gastroenterologist:Charles K. Abbey Chatters, DO   Chief Complaint   No chief complaint on file.   History of Present Illness   Kara [name] is a 77 y.o. female presenting today      EGD 04/2022: -2cm hh -mild Schatzki ring -gastritis, reactive gastropathy, no H.pylori -congested duodenal mucosa, mild acute duodenitis  Colonoscopy 04/2022: -diverticulosis -five polyps removed, tubular adenomas -next colonoscopy in 3 years    Medications   Current Outpatient Medications  Medication Sig Dispense Refill   acetaminophen (TYLENOL) 500 MG tablet Take 500 mg by mouth every 8 (eight) hours as needed for moderate pain.     albuterol (VENTOLIN HFA) 108 (90 Base) MCG/ACT inhaler INHALE 1 TO 2 PUFFS INTO THE LUNGS EVERY 6 HOURS AS NEEDED FOR WHEEZING OR SHORTNESS OF BREATH 6.7 g 2   Ascorbic Acid (VITAMIN C WITH ROSE HIPS) 500 MG tablet Take 500 mg by mouth daily.     Calcium Carb-Cholecalciferol (CALCIUM 600 + D PO) Take 1 tablet by mouth daily.     Cholecalciferol (VITAMIN D) 50 MCG (2000 UT) CAPS Take 2,000 Units by mouth daily.     furosemide (LASIX) 40 MG tablet Take 1 tablet (40 mg total) by mouth daily as needed for edema. 30 tablet 2   Misc Natural Products (SAMBUCUS ELDERBERRY VITAMIN C MT) Take 1 each by mouth daily. Gummy     pantoprazole (PROTONIX) 40 MG tablet TAKE 1 TABLET(40 MG) BY MOUTH DAILY BEFORE BREAKFAST 30 tablet 5   SYNTHROID 112 MCG tablet TAKE 1 TABLET(112 MCG) BY MOUTH DAILY BEFORE BREAKFAST 90 tablet 1   No current facility-administered medications for this visit.    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  The STAT Breast cancer panel offered by [facility] includes sequencing and rearrangement analysis for the following 9 genes:  ATM, BRCA1, BRCA2, CDH1, CHEK2, PALB2, PTEN, STK11 and TP53.   The report date is August 09, 2019.   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 fibroids   BIOPSY  05/02/2022   Procedure: BIOPSY;  Surgeon: Eloise Harman, DO;  [facility];  Service: Endoscopy;;   BREAST BIOPSY Left 05/2019   left breast DCIS    BREAST LUMPECTOMY Left 06/2019   DCIS   BREAST LUMPECTOMY WITH RADIOACTIVE SEED LOCALIZATION Left 07/08/2019   Procedure: LEFT BREAST LUMPECTOMY WITH RADIOACTIVE SEED LOCALIZATION;  Surgeon: Jovita Kussmaul, MD;  Location: [facility];  Service: General;  Laterality: Left;   BREAST SURGERY N/A    Phreesia 08/30/2020   CESAREAN SECTION     CESAREAN SECTION N/A    Phreesia 08/30/2020   CHOLECYSTECTOMY     COLONOSCOPY  2011   [place]: two 3-4 mm sessile polyps, hyperplastic, sigmoid diverticula, TI appeared normal.    COLONOSCOPY WITH PROPOFOL N/A 05/02/2022   Procedure: COLONOSCOPY WITH PROPOFOL;  Surgeon: Eloise Harman, DO;  [facility];  Service: Endoscopy;  Laterality: N/A;  11:30 am   ESOPHAGOGASTRODUODENOSCOPY  2011   [place]: non-bleeding erosive gastropathy, normal duodenum. path: unremarkable duodenum, negative H.pylori, minimal esophagitis    ESOPHAGOGASTRODUODENOSCOPY (EGD) WITH PROPOFOL N/A 05/02/2022   Procedure: ESOPHAGOGASTRODUODENOSCOPY (EGD) WITH PROPOFOL;  Surgeon: Eloise Harman, DO;  [facility];  Service: Endoscopy;  Laterality: N/A;   POLYPECTOMY  05/02/2022   Procedure: POLYPECTOMY;  Surgeon: Eloise Harman, DO;  [facility];  Service: Endoscopy;;   TONSILLECTOMY  1975    Past Family History   Family History  Problem Relation Age of Onset   Breast cancer Maternal Grandmother        dx over 38   Prostate cancer Maternal Grandfather    Arthritis Mother    COPD Mother    Breast cancer Mother 50       second at age 21   Cancer Father        throat   Stroke Father    Cancer Maternal Aunt 30       [name], cervical cancer   Breast cancer Maternal Aunt        dx over 75   Breast cancer Paternal Aunt        dx under 46   Prostate cancer Maternal Uncle        dx over 82   Cancer Other        father's maternal cousin was Henreietta Lacks, Cervical   Cancer Maternal Uncle        unknown  cancer   Breast cancer Paternal Aunt        dx over 43   Colon cancer Neg Hx    Allergic rhinitis Neg Hx    Angioedema Neg Hx    Asthma Neg Hx    Atopy Neg Hx    Eczema Neg Hx    Urticaria Neg Hx    Immunodeficiency Neg Hx     Past Social History   Social History   Socioeconomic History   Marital status: Divorced    Spouse name: Not  on file   Number of children: 2   Years of education: 15   Highest education level: Not on file  Occupational History   Occupation: retired    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  Tobacco Use   Smoking status: Former    Types: Cigarettes    Quit date: 06/15/1998    Years since quitting: 24.0   Smokeless tobacco: Never   Tobacco comments:    quit 2002  Vaping Use   Vaping Use: Never used  Substance and Sexual Activity   Alcohol use: No   Drug use: No   Sexual activity: Not Currently    Birth control/protection: Surgical    Comment: hyst  Other Topics Concern   Not on file  Social History Narrative   Lives alone   2 grown children- one in [place] and one here   Grandchildren       Moved to [place] from [place] for aging mother who is 68 with Alzheimers      Maternal Aunt is Stage manager of the [employer] call line      Enjoy: gym, time with friends       Diet: eats all food groups   Caffeine: 2 diet cokes daily    Water: 2-4 cups daily       Wears seat belt    Does not use phone while driving    Smoke and carbon Careers adviser at home   [employer]    No weapons       Right Handed    Lives alone in a one story home    Retired   Investment banker, operational of Health   Financial Resource Strain: [place]  (10/06/2020)   Overall Financial Resource Strain (CARDIA)    Difficulty of Paying Living Expenses: Not hard at all  Food Insecurity: No [place] (10/06/2020)   Hunger Vital Sign    Worried About Running Out of Food in the Last Year: Never true    [place] in the Last Year: Never  true  Transportation Needs: No Transportation Needs (10/06/2020)   PRAPARE - Hydrologist (Medical): No    Lack of Transportation (Non-Medical): No  Physical Activity: Sufficiently Active (10/06/2020)   Exercise Vital Sign    Days of Exercise per Week: 7 days    Minutes of Exercise per Session: 30 min  Stress: No Stress Concern Present (10/06/2020)   [place]    Feeling of Stress : Not at all  Social Connections: Moderately Isolated (10/06/2020)   Social Connection and Isolation Panel [NHANES]    Frequency of Communication with Friends and Family: More than three times a week    Frequency of Social Gatherings with Friends and Family: More than three times a week    Attends Religious Services: Never    Marine scientist or Organizations: Yes    Attends Music therapist: More than 4 times per year    Marital Status: Divorced  Intimate Partner Violence: Not At Risk (12/04/2019)   Humiliation, Afraid, Rape, and Kick questionnaire    Fear of Current or Ex-Partner: No    Emotionally Abused: No    Physically Abused: No    Sexually Abused: No    Review of Systems   General: Negative for anorexia, weight loss, fever, chills, fatigue, weakness. ENT: Negative for hoarseness, difficulty swallowing , nasal congestion. CV:  Negative for chest pain, angina, palpitations, dyspnea on exertion, peripheral edema.  Respiratory: Negative for dyspnea at rest, dyspnea on exertion, cough, sputum, wheezing.  GI: See history of present illness. GU:  Negative for dysuria, hematuria, urinary incontinence, urinary frequency, nocturnal urination.  Endo: Negative for unusual weight change.     Physical Exam   There were no vitals taken for this visit.   General: Well-nourished, well-developed in no acute distress.  Eyes: No icterus. Mouth: Oropharyngeal mucosa moist and pink , no lesions erythema  or exudate. Lungs: Clear to auscultation bilaterally.  Heart: Regular rate and rhythm, no murmurs rubs or gallops.  Abdomen: Bowel sounds are normal, nontender, nondistended, no hepatosplenomegaly or masses,  no abdominal bruits or hernia , no rebound or guarding.  Rectal: ***  Extremities: No lower extremity edema. No clubbing or deformities. Neuro: Alert and oriented x 4   Skin: Warm and dry, no jaundice.   Psych: Alert and cooperative, normal mood and affect.  Labs   Lab Results  Component Value Date   CREATININE 1.11 (H) 04/25/2022   BUN 18 04/25/2022   NA 139 04/25/2022   K 3.8 04/25/2022   CL 107 04/25/2022   CO2 26 04/25/2022   Lab Results  Component Value Date   ALT 22 10/25/2020   AST 23 10/25/2020   ALKPHOS 133 (H) 10/25/2020   BILITOT 0.3 10/25/2020   Lab Results  Component Value Date   WBC 6.8 10/25/2020   HGB 13.5 10/25/2020   HCT 42.3 10/25/2020   MCV 82 10/25/2020   PLT 203 10/25/2020   Lab Results  Component Value Date   HGBA1C 6.3 04/19/2022   Lab Results  Component Value Date   [id number] 04/13/2022   Lab Results  Component Value Date   TSH 4.420 04/13/2022    Imaging Studies   MM 3D SCREEN BREAST BILATERAL  Result Date: 06/14/2022 CLINICAL DATA:  Screening. EXAM: DIGITAL SCREENING BILATERAL MAMMOGRAM WITH TOMOSYNTHESIS AND CAD TECHNIQUE: Bilateral screening digital craniocaudal and mediolateral oblique mammograms were obtained. Bilateral screening digital breast tomosynthesis was performed. The images were evaluated with computer-aided detection. COMPARISON:  Previous exam(s). ACR Breast Density Category b: There are scattered areas of fibroglandular density. FINDINGS: There are no findings suspicious for malignancy. IMPRESSION: No mammographic evidence of malignancy. A result letter of this screening mammogram will be mailed directly to the patient. RECOMMENDATION: Screening mammogram in one year. (Code:SM-B-01Y) BI-RADS CATEGORY  1:  Negative. Electronically Signed   By: Lillia Mountain M.D.   On: 06/14/2022 14:40   NCV with EMG(electromyography)  Result Date: 06/12/2022 Shellia Carwin, MD     06/12/2022 10:37 AM [facility] [place], [place]  [place], [place] [address] Tel: [phone] Fax: [phone] Test Date:  06/12/2022 Patient: Kara [name] DOB: 02/08/1946 Physician: Kai Levins, MD Sex: Female Height: 5' 6"$  Ref Phys: Kai Levins, MD ID#: [id number]   Technician:  History: This is a 77 year old female with numbness and tingling in bilateral lower extremities. NCV & EMG Findings: This is an incomplete electrodiagnostic studies of the left upper and lower limb. The study was stopped early due to patient discomfort. The limited evaluation shows: Left sural and superficial peroneal/fibular sensory responses are absent, though lower extremity edema and patient discomfort did not allow for maximal stimulation. Left median sensory response is within normal limits. Impression: This is an incomplete electrodiagnostic evaluation that was stopped early due to patient discomfort. No definitive conclusions can be made from  this very limited evaluation. The absent responses in the lower extremity may be technical in nature due to significant peripheral edema and inability to achieve maximal stimulation. ___________________________ Kai Levins, MD Nerve Conduction Studies Sensory Sites   Neg Peak Lat Amplitude (O-P) Segment Distance Velocity Comment Site (ms) Norm (V) Norm  (cm) (ms)  Left Median Sensory Wrist 2.8  < 3.8 46  > 10 Wrist-Dig II 13   Left Superficial Fibular Sensory 14 cm-Ankle *NR  < 4.6 *NR  > 3 14 cm-Ankle 14   Left Sural Sensory Calf-Lat mall *NR  < 4.6 *NR  > 3 Calf-Lat mall 14   Waveforms: Sensory         Assessment       PLAN   ***   Laureen Ochs. Bobby Rumpf, [place], [facility]

## 2022-07-03 ENCOUNTER — Encounter: Payer: Self-pay | Admitting: *Deleted

## 2022-07-03 ENCOUNTER — Encounter: Payer: Self-pay | Admitting: Gastroenterology

## 2022-07-03 ENCOUNTER — Ambulatory Visit (INDEPENDENT_AMBULATORY_CARE_PROVIDER_SITE_OTHER): Payer: Medicare Other | Admitting: Gastroenterology

## 2022-07-03 VITALS — BP 100/62 | HR 83 | Temp 97.5°F | Ht 66.0 in | Wt 226.0 lb

## 2022-07-03 DIAGNOSIS — K219 Gastro-esophageal reflux disease without esophagitis: Secondary | ICD-10-CM

## 2022-07-03 DIAGNOSIS — I959 Hypotension, unspecified: Secondary | ICD-10-CM

## 2022-07-03 DIAGNOSIS — R1011 Right upper quadrant pain: Secondary | ICD-10-CM | POA: Diagnosis not present

## 2022-07-03 NOTE — Patient Instructions (Signed)
Continue pantoprazole 40m daily for total of 12 weeks.  [facility] as we do need to check for anemia as a source of your low blood pressure.  CT scan to be scheduled.  Please follow up with PCP regarding low blood pressures, lightheadedness. If symptoms worsen, please go to the ER.

## 2022-07-04 DIAGNOSIS — H401221 Low-tension glaucoma, left eye, mild stage: Secondary | ICD-10-CM | POA: Diagnosis not present

## 2022-07-05 DIAGNOSIS — D0512 Intraductal carcinoma in situ of left breast: Secondary | ICD-10-CM | POA: Diagnosis not present

## 2022-07-05 DIAGNOSIS — M79671 Pain in right foot: Secondary | ICD-10-CM | POA: Diagnosis not present

## 2022-07-13 ENCOUNTER — Encounter: Payer: Self-pay | Admitting: Radiology

## 2022-07-17 ENCOUNTER — Encounter: Payer: Self-pay | Admitting: Internal Medicine

## 2022-07-17 ENCOUNTER — Ambulatory Visit (INDEPENDENT_AMBULATORY_CARE_PROVIDER_SITE_OTHER): Payer: Medicare Other | Admitting: Internal Medicine

## 2022-07-17 VITALS — BP 102/60 | HR 82 | Ht 66.0 in | Wt 225.6 lb

## 2022-07-17 DIAGNOSIS — Z8619 Personal history of other infectious and parasitic diseases: Secondary | ICD-10-CM | POA: Diagnosis not present

## 2022-07-17 DIAGNOSIS — G4730 Sleep apnea, unspecified: Secondary | ICD-10-CM

## 2022-07-17 DIAGNOSIS — E890 Postprocedural hypothyroidism: Secondary | ICD-10-CM

## 2022-07-17 DIAGNOSIS — M7989 Other specified soft tissue disorders: Secondary | ICD-10-CM | POA: Diagnosis not present

## 2022-07-17 DIAGNOSIS — K219 Gastro-esophageal reflux disease without esophagitis: Secondary | ICD-10-CM

## 2022-07-17 NOTE — Patient Instructions (Signed)
Please take Lasix 20 mg as needed for leg swelling.  Please continue to perform leg elevation and compression stocking as tolerated.  You are being scheduled to get home sleep study done.

## 2022-07-19 ENCOUNTER — Other Ambulatory Visit: Payer: Self-pay | Admitting: Internal Medicine

## 2022-07-19 DIAGNOSIS — M7989 Other specified soft tissue disorders: Secondary | ICD-10-CM

## 2022-07-19 LAB — LYME DISEASE SEROLOGY W/REFLEX: Lyme Total Antibody EIA: NEGATIVE

## 2022-07-21 DIAGNOSIS — G4733 Obstructive sleep apnea (adult) (pediatric): Secondary | ICD-10-CM | POA: Insufficient documentation

## 2022-07-21 DIAGNOSIS — Z8619 Personal history of other infectious and parasitic diseases: Secondary | ICD-10-CM | POA: Insufficient documentation

## 2022-07-21 DIAGNOSIS — G4730 Sleep apnea, unspecified: Secondary | ICD-10-CM | POA: Insufficient documentation

## 2022-07-21 NOTE — Progress Notes (Signed)
Established Patient Office Visit  Subjective:  Patient ID: Kara [name], female    DOB: 09-30-1945  Age: 77 y.o. MRN: [id number]  CC:  Chief Complaint  Patient presents with   Follow-up    HPI Kara [name] is a 77 y.o. female with past medical history of GERD and hypothyroidism who presents for f/u of her chronic medical conditions.  She has been taking Synthroid regularly for her hypothyroidism.  Denies any recent change in weight or appetite.  She was advised to get sleep study by optometrist.  She is unsure of snoring at nighttime.  She has daytime fatigue, chronic.  Denies known episode of apnea.  Due to her current BMI and wide neck circumference, she is at moderate to high risk for OSA.  She is still complains of persistent leg swelling.  She had dizziness with Lasix, likely due to orthostatic hypotension.  She has tried leg elevation and compression socks without much relief.  She denies orthopnea or PND currently.  She had Lyme IgG positive at rheumatology office.  She currently denies any known rash.  [place] has had [place] exposure.  Denies any recent fever or chills.  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  The STAT Breast cancer panel offered by [facility] includes sequencing and rearrangement analysis for the following 9 genes:  ATM, BRCA1, BRCA2, CDH1, CHEK2, PALB2, PTEN, STK11 and TP53.   The  report date is August 09, 2019.   GERD (gastroesophageal reflux disease)    H/O swallowed foreign body 11/20/2018   Headache in back of head 12/31/2018   Hip pain, acute, left 03/27/2019   HLD (hyperlipidemia) 08/23/2016   Hyperlipidemia    Hypothyroid 08/14/2016   Insomnia due to stress 12/23/2018   Malignant neoplasm of upper-outer quadrant of left breast in female, estrogen receptor negative ([place]) 06/11/2019   DCIS left breast   Perimenopausal vasomotor symptoms 02/11/2016   Personal history of radiation therapy 2021   completed left breast radiation in April 2021   Postmenopausal atrophic vaginitis 11/09/2015   Pre-diabetes    no meds   Prediabetes 08/30/2017   Thyroid disease    Grave's   Vaginal discharge 01/16/2019   Weight loss, unintentional 03/27/2019    Past Surgical History:  Procedure Laterality Date   ABDOMINAL HYSTERECTOMY     bleeding. fibroids   BIOPSY  05/02/2022   Procedure: BIOPSY;  Surgeon: Eloise Harman, DO;  [facility];  Service: Endoscopy;;   BREAST BIOPSY Left 05/2019   left breast DCIS   BREAST LUMPECTOMY Left 06/2019   DCIS   BREAST LUMPECTOMY WITH RADIOACTIVE SEED LOCALIZATION Left 07/08/2019   Procedure: LEFT BREAST LUMPECTOMY WITH RADIOACTIVE SEED LOCALIZATION;  Surgeon: Jovita Kussmaul, MD;  Location: [facility];  Service:  General;  Laterality: Left;   BREAST SURGERY N/A    Phreesia 08/30/2020   CESAREAN SECTION     CESAREAN SECTION N/A    Phreesia 08/30/2020   CHOLECYSTECTOMY     COLONOSCOPY  2011   [place]: two 3-4 mm sessile polyps, hyperplastic, sigmoid diverticula, TI appeared normal.    COLONOSCOPY WITH PROPOFOL N/A 05/02/2022   Procedure: COLONOSCOPY WITH PROPOFOL;  Surgeon: Eloise Harman, DO;  [facility];  Service: Endoscopy;  Laterality: N/A;  11:30 am   ESOPHAGOGASTRODUODENOSCOPY  2011   [place]: non-bleeding erosive gastropathy, normal duodenum. path: unremarkable duodenum, negative H.pylori,  minimal esophagitis    ESOPHAGOGASTRODUODENOSCOPY (EGD) WITH PROPOFOL N/A 05/02/2022   Procedure: ESOPHAGOGASTRODUODENOSCOPY (EGD) WITH PROPOFOL;  Surgeon: Eloise Harman, DO;  [facility];  Service: Endoscopy;  Laterality: N/A;   POLYPECTOMY  05/02/2022   Procedure: POLYPECTOMY;  Surgeon: Eloise Harman, DO;  [facility];  Service: Endoscopy;;   TONSILLECTOMY  1975    Family History  Problem Relation Age of Onset   Breast cancer Maternal Grandmother        dx over 85   Prostate cancer Maternal Grandfather    Arthritis Mother    COPD Mother    Breast cancer Mother 67       second at age 80   Cancer Father        throat   Stroke Father    Cancer Maternal Aunt 30       [name], cervical cancer   Breast cancer Maternal Aunt        dx over 23   Breast cancer Paternal Aunt        dx under 62   Prostate cancer Maternal Uncle        dx over 50   Cancer Other        father's maternal cousin was Henreietta Lacks, Cervical   Cancer Maternal Uncle        unknown cancer   Breast cancer Paternal Aunt        dx over 23   Colon cancer Neg Hx    Allergic rhinitis Neg Hx    Angioedema Neg Hx    Asthma Neg Hx    Atopy Neg Hx    Eczema Neg Hx    Urticaria Neg Hx    Immunodeficiency Neg Hx     Social History   Socioeconomic History   Marital status: Divorced    Spouse name: Not on file   Number of children: 2   Years of education: 15   Highest education level: Not on file  Occupational History   Occupation: retired    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  Tobacco Use   Smoking status: Former    Types: Cigarettes    Quit date: 06/15/1998    Years since quitting: 24.1   Smokeless tobacco: Never   Tobacco comments:    quit 2002  Vaping Use   Vaping Use: Never used  Substance and Sexual Activity   Alcohol use: No   Drug use: No   Sexual activity: Not Currently    Birth control/protection: Surgical    Comment: hyst   Other Topics Concern   Not on file  Social History Narrative   Lives alone   2 grown children- one in [place] and one here   Grandchildren       Moved to [place] from [place] for aging mother who  is 48 with Alzheimers      Maternal Aunt is [employer] of the HeLa cancer call line      Enjoy: gym, time with friends       Diet: eats all food groups   Caffeine: 2 diet cokes daily    Water: 2-4 cups daily       Wears seat belt    Does not use phone while driving    Smoke and carbon detectors at home   [employer]    No weapons       Right Handed    Lives alone in a one story home    Retired   Investment banker, operational of Health   Financial Resource Strain: [place]  (10/06/2020)   Overall Financial Resource Strain (CARDIA)    Difficulty of Paying Living Expenses: Not hard at all  Food Insecurity: No [place] (10/06/2020)   Hunger Vital Sign    Worried About Running Out of Food in the Last Year: Never true    [place] in the Last Year: Never true  Transportation Needs: No Transportation Needs (10/06/2020)   PRAPARE - Hydrologist (Medical): No    Lack of Transportation (Non-Medical): No  Physical Activity: Sufficiently Active (10/06/2020)   Exercise Vital Sign    Days of Exercise per Week: 7 days    Minutes of Exercise per Session: 30 min  Stress: No Stress Concern Present (10/06/2020)   [place]    Feeling of Stress : Not at all  Social Connections: Moderately Isolated (10/06/2020)   Social Connection and Isolation Panel [NHANES]    Frequency of Communication with Friends and Family: More than three times a week    Frequency of Social Gatherings with Friends and Family: More than three times a week    Attends Religious Services: Never    Marine scientist or Organizations: Yes    Attends Music therapist: More than 4 times per year     Marital Status: Divorced  Intimate Partner Violence: Not At Risk (12/04/2019)   Humiliation, Afraid, Rape, and Kick questionnaire    Fear of Current or Ex-Partner: No    Emotionally Abused: No    Physically Abused: No    Sexually Abused: No    Outpatient Medications Prior to Visit  Medication Sig Dispense Refill   acetaminophen (TYLENOL) 500 MG tablet Take 500 mg by mouth every 8 (eight) hours as needed for moderate pain.     albuterol (VENTOLIN HFA) 108 (90 Base) MCG/ACT inhaler INHALE 1 TO 2 PUFFS INTO THE LUNGS EVERY 6 HOURS AS NEEDED FOR WHEEZING OR SHORTNESS OF BREATH 6.7 g 2   Ascorbic Acid (VITAMIN C WITH ROSE HIPS) 500 MG tablet Take 500 mg by mouth daily.     brimonidine (ALPHAGAN) 0.2 % ophthalmic solution 1 drop 2 (two) times daily.     Calcium Carb-Cholecalciferol (CALCIUM 600 + D PO) Take 1 tablet by mouth daily.     Cholecalciferol (VITAMIN D) 50 MCG (2000 UT) CAPS Take 2,000 Units by mouth daily.     latanoprost (XALATAN) 0.005 % ophthalmic solution SMARTSIG:1 Drop(s) In Eye(s) Every Evening     Misc Natural Products (SAMBUCUS ELDERBERRY VITAMIN C MT) Take 1 each by mouth daily. Gummy     pantoprazole (PROTONIX) 40 MG tablet TAKE 1 TABLET(40 MG) BY MOUTH DAILY BEFORE BREAKFAST 30 tablet 5   SYNTHROID 112 MCG  tablet TAKE 1 TABLET(112 MCG) BY MOUTH DAILY BEFORE BREAKFAST 90 tablet 1   furosemide (LASIX) 40 MG tablet Take 1 tablet (40 mg total) by mouth daily as needed for edema. 30 tablet 2   No facility-administered medications prior to visit.    Allergies  Allergen Reactions   Coconut (Cocos Nucifera) Anaphylaxis   Other Shortness Of Breath    Covid booster pfizer     Prednisone Hypertension    Severe headache   Petrolatum Dermatitis    Caused incision irritation    Naproxen Other (See Comments)    headache    ROS Review of Systems  Constitutional:  Positive for fatigue. Negative for chills and fever.  HENT:  Negative for congestion, sinus pressure, sinus  pain and sore throat.   Eyes:  Negative for pain and discharge.  Respiratory:  Negative for cough and shortness of breath.   Cardiovascular:  Positive for leg swelling. Negative for chest pain and palpitations.  Gastrointestinal:  Negative for abdominal pain, constipation, diarrhea, nausea and vomiting.  Endocrine: Negative for polydipsia and polyuria.  Genitourinary:  Negative for dysuria and hematuria.  Musculoskeletal:  Positive for arthralgias and back pain. Negative for neck pain and neck stiffness.  Skin:  Negative for rash.  Neurological:  Positive for numbness (and tingling of LE). Negative for dizziness and weakness.  Psychiatric/Behavioral:  Negative for agitation and behavioral problems.       Objective:    Physical Exam Vitals reviewed.  Constitutional:      General: She is not in acute distress.    Appearance: She is obese. She is not diaphoretic.  HENT:     Head: Normocephalic and atraumatic.     Nose: Nose normal. No congestion.     Mouth/Throat:     Mouth: Mucous membranes are moist.     Pharynx: No posterior oropharyngeal erythema.  Eyes:     General: No scleral icterus.    Extraocular Movements: Extraocular movements intact.  Cardiovascular:     Rate and Rhythm: Normal rate and regular rhythm.     Pulses: Normal pulses.     Heart sounds: Normal heart sounds. No murmur heard. Pulmonary:     Breath sounds: Normal breath sounds. No wheezing or rales.  Musculoskeletal:     Cervical back: Neck supple. No tenderness.     Right lower leg: Edema (2+) present.     Left lower leg: Edema (2+) present.  Skin:    General: Skin is warm.     Findings: No rash.  Neurological:     General: No focal deficit present.     Mental Status: She is alert and oriented to person, place, and time.     Sensory: Sensory deficit (B/l LE) present.     Motor: No weakness.  Psychiatric:        Mood and Affect: Mood normal.        Behavior: Behavior normal.     BP 102/60   Pulse  82   Ht '5\' 6"'$  (1.676 m)   Wt 225 lb 9.6 oz (102.3 kg)   SpO2 97%   BMI 36.41 kg/m  Wt Readings from Last 3 Encounters:  07/17/22 225 lb 9.6 oz (102.3 kg)  07/03/22 226 lb (102.5 kg)  05/02/22 231 lb (104.8 kg)    Lab Results  Component Value Date   TSH 4.420 04/13/2022   Lab Results  Component Value Date   WBC 6.8 10/25/2020   HGB 13.5 10/25/2020   HCT 42.3  10/25/2020   MCV 82 10/25/2020   PLT 203 10/25/2020   Lab Results  Component Value Date   NA 139 04/25/2022   K 3.8 04/25/2022   CO2 26 04/25/2022   GLUCOSE 112 (H) 04/25/2022   BUN 18 04/25/2022   CREATININE 1.11 (H) 04/25/2022   BILITOT 0.3 10/25/2020   ALKPHOS 133 (H) 10/25/2020   AST 23 10/25/2020   ALT 22 10/25/2020   PROT 6.9 10/25/2020   ALBUMIN 3.9 10/25/2020   CALCIUM 9.2 04/25/2022   ANIONGAP 6 04/25/2022   EGFR 74 05/23/2021   Lab Results  Component Value Date   CHOL 185 04/13/2022   Lab Results  Component Value Date   HDL 52 04/13/2022   Lab Results  Component Value Date   LDLCALC 120 (H) 04/13/2022   Lab Results  Component Value Date   TRIG 70 04/13/2022   Lab Results  Component Value Date   CHOLHDL 3.6 04/13/2022   Lab Results  Component Value Date   HGBA1C 6.3 04/19/2022      Assessment & Plan:   Problem List Items Addressed This Visit       Respiratory   Sleep apnea    STOP-BANG: 5 At moderate to high risk for OSA Ordered home sleep study      Relevant Orders   Home sleep test     Digestive   GERD (gastroesophageal reflux disease)    Well controlled with pantoprazole        Endocrine   Hypothyroidism following radioiodine therapy    Lab Results  Component Value Date   TSH 4.420 04/13/2022  On Levothyroxine, f/u with Dr Dorris Fetch        Other   Leg swelling - Primary    Likely related to venous insufficiency Leg elevation and compression socks Referred to PT, but had groin pain Uses compression device at home, needs to use it regularly Had increased  dose of Lasix to 40 mg daily, but has dizziness -she tolerates 20 mg dose, but has persistent leg swelling with it      H/o Lyme disease    Check Lyme serology as per patient request She currently does not have any rash, fever or chills Likely old Lyme infection      Relevant Orders   Lyme Disease Serology w/Reflex (Completed)    No orders of the defined types were placed in this encounter.   Follow-up: Return in about 6 months (around 01/17/2023).    Lindell Spar, MD

## 2022-07-21 NOTE — Assessment & Plan Note (Signed)
Well-controlled with pantoprazole ?

## 2022-07-21 NOTE — Assessment & Plan Note (Signed)
Likely related to venous insufficiency Leg elevation and compression socks Referred to PT, but had groin pain Uses compression device at home, needs to use it regularly Had increased dose of Lasix to 40 mg daily, but has dizziness -she tolerates 20 mg dose, but has persistent leg swelling with it

## 2022-07-21 NOTE — Assessment & Plan Note (Signed)
STOP-BANG: 5 At moderate to high risk for OSA Ordered home sleep study

## 2022-07-21 NOTE — Assessment & Plan Note (Signed)
Lab Results  Component Value Date   TSH 4.420 04/13/2022   On Levothyroxine, f/u with Dr Dorris Fetch

## 2022-07-21 NOTE — Assessment & Plan Note (Addendum)
Check Lyme serology as per patient request She currently does not have any rash, fever or chills Likely old Lyme infection

## 2022-08-03 DIAGNOSIS — G4733 Obstructive sleep apnea (adult) (pediatric): Secondary | ICD-10-CM | POA: Diagnosis not present

## 2022-08-15 ENCOUNTER — Other Ambulatory Visit: Payer: Self-pay | Admitting: Gastroenterology

## 2022-08-15 DIAGNOSIS — R7989 Other specified abnormal findings of blood chemistry: Secondary | ICD-10-CM

## 2022-08-15 DIAGNOSIS — R1011 Right upper quadrant pain: Secondary | ICD-10-CM

## 2022-08-15 DIAGNOSIS — R748 Abnormal levels of other serum enzymes: Secondary | ICD-10-CM

## 2022-08-15 MED ORDER — SUCRALFATE 1 GM/10ML PO SUSP: 1.0000 g | Freq: Three times a day (TID) | ORAL | 1 refills | Status: DC

## 2022-08-17 DIAGNOSIS — R1011 Right upper quadrant pain: Secondary | ICD-10-CM | POA: Diagnosis not present

## 2022-08-17 DIAGNOSIS — R7989 Other specified abnormal findings of blood chemistry: Secondary | ICD-10-CM | POA: Diagnosis not present

## 2022-08-17 DIAGNOSIS — R748 Abnormal levels of other serum enzymes: Secondary | ICD-10-CM | POA: Diagnosis not present

## 2022-08-20 LAB — COMPREHENSIVE METABOLIC PANEL
ALT: 12 IU/L (ref 0–32)
AST: 12 IU/L (ref 0–40)
Albumin/Globulin Ratio: 1.2 (ref 1.2–2.2)
Albumin: 3.9 g/dL (ref 3.8–4.8)
Alkaline Phosphatase: 142 IU/L — ABNORMAL HIGH (ref 44–121)
BUN/Creatinine Ratio: 19 (ref 12–28)
BUN: 17 mg/dL (ref 8–27)
Bilirubin Total: 0.4 mg/dL (ref 0.0–1.2)
CO2: 20 mmol/L (ref 20–29)
Calcium: 10 mg/dL (ref 8.7–10.3)
Chloride: 106 mmol/L (ref 96–106)
Creatinine, Ser: 0.91 mg/dL (ref 0.57–1.00)
Globulin, Total: 3.2 g/dL (ref 1.5–4.5)
Glucose: 124 mg/dL — ABNORMAL HIGH (ref 70–99)
Potassium: 4.4 mmol/L (ref 3.5–5.2)
Sodium: 142 mmol/L (ref 134–144)
Total Protein: 7.1 g/dL (ref 6.0–8.5)
eGFR: 66 mL/min/{1.73_m2} (ref >59)

## 2022-08-20 LAB — CBC WITH DIFFERENTIAL/PLATELET
Basophils Absolute: 0.1 10*3/uL (ref 0.0–0.2)
Basos: 1 %
EOS (ABSOLUTE): 0.2 10*3/uL (ref 0.0–0.4)
Eos: 3 %
Hematocrit: 44.9 % (ref 34.0–46.6)
Hemoglobin: 14.2 g/dL (ref 11.1–15.9)
Immature Grans (Abs): 0 10*3/uL (ref 0.0–0.1)
Immature Granulocytes: 1 %
Lymphocytes Absolute: 2.4 10*3/uL (ref 0.7–3.1)
Lymphs: 36 %
MCH: 25.7 pg — ABNORMAL LOW (ref 26.6–33.0)
MCHC: 31.6 g/dL (ref 31.5–35.7)
MCV: 81 fL (ref 79–97)
Monocytes Absolute: 0.5 10*3/uL (ref 0.1–0.9)
Monocytes: 8 %
Neutrophils Absolute: 3.4 10*3/uL (ref 1.4–7.0)
Neutrophils: 51 %
Platelets: 216 10*3/uL (ref 150–450)
RBC: 5.52 x10E6/uL — ABNORMAL HIGH (ref 3.77–5.28)
RDW: 15.3 % (ref 11.7–15.4)
WBC: 6.6 10*3/uL (ref 3.4–10.8)

## 2022-08-20 LAB — LIPASE: Lipase: 29 U/L (ref 14–85)

## 2022-08-20 LAB — MITOCHONDRIAL ANTIBODIES: Mitochondrial Ab: <20 Units (ref 0.0–20.0)

## 2022-08-20 LAB — GAMMA GT: GGT: 74 IU/L — ABNORMAL HIGH (ref 0–60)

## 2022-08-21 ENCOUNTER — Telehealth: Payer: Self-pay

## 2022-08-21 ENCOUNTER — Other Ambulatory Visit: Payer: Self-pay | Admitting: Internal Medicine

## 2022-08-21 DIAGNOSIS — G4733 Obstructive sleep apnea (adult) (pediatric): Secondary | ICD-10-CM

## 2022-08-24 ENCOUNTER — Ambulatory Visit (HOSPITAL_COMMUNITY)
Admission: RE | Admit: 2022-08-24 | Discharge: 2022-08-24 | Disposition: A | Discharge status: Home / Self Care | Payer: Medicare Other | Source: Ambulatory Visit | Attending: Gastroenterology | Admitting: Gastroenterology

## 2022-08-24 DIAGNOSIS — R1011 Right upper quadrant pain: Secondary | ICD-10-CM | POA: Insufficient documentation

## 2022-08-24 DIAGNOSIS — K219 Gastro-esophageal reflux disease without esophagitis: Secondary | ICD-10-CM | POA: Diagnosis not present

## 2022-08-24 DIAGNOSIS — I700 Atherosclerosis of aorta: Secondary | ICD-10-CM | POA: Diagnosis not present

## 2022-08-24 DIAGNOSIS — R109 Unspecified abdominal pain: Secondary | ICD-10-CM | POA: Diagnosis not present

## 2022-08-24 DIAGNOSIS — I959 Hypotension, unspecified: Secondary | ICD-10-CM | POA: Diagnosis not present

## 2022-08-24 MED ORDER — IOHEXOL 300 MG/ML  SOLN
100.0000 mL | Freq: Once | INTRAMUSCULAR | Status: AC | PRN
  Administered 2022-08-24: 100 mL via INTRAVENOUS

## 2022-08-30 ENCOUNTER — Other Ambulatory Visit: Payer: Self-pay

## 2022-08-30 DIAGNOSIS — R7989 Other specified abnormal findings of blood chemistry: Secondary | ICD-10-CM

## 2022-08-31 NOTE — Telephone Encounter (Signed)
Patient needs to see Dr. Marletta Lor ASAP. I don't have anything else to offer patient and need his opinion. I spoke to [place] and she said Dr. Marletta Lor is still seeing follow ups.   Please schedule PET-CT due to pelvic lymphadenopathy seen on CT recently. Patient agreeable.

## 2022-09-01 ENCOUNTER — Telehealth: Payer: Self-pay | Admitting: Hematology and Oncology

## 2022-09-01 NOTE — Progress Notes (Signed)
Patient Care Team: Anabel Halon, MD as PCP - General (Internal Medicine) Thurmon Fair, MD as PCP - Cardiology (Cardiology) Jena Gauss Gerrit Friends, MD as Consulting Physician (Gastroenterology) Griselda Miner, MD as Consulting Physician (General Surgery) Serena Croissant, MD as Consulting Physician (Hematology and Oncology) Antony Blackbird, MD as Consulting Physician (Radiation Oncology) Lanelle Bal, DO as Consulting Physician (Gastroenterology) Antony Madura, MD as Consulting Physician (Neurology)  DIAGNOSIS: No diagnosis found.  SUMMARY OF ONCOLOGIC HISTORY: Oncology History  Ductal carcinoma in situ (DCIS) of left breast  06/04/2019 Cancer Staging   Staging form: Breast, AJCC 8th Edition - Clinical stage from 06/04/2019: Stage 0 (cTis (DCIS), cN0, cM0, ER-, PR-)   06/11/2019 Initial Diagnosis   Screening mammogram showed left breast calcifications. Diagnostic mammogram showed left breast calcifications spanning 1.1cm. Biopsy showed DCIS with apocrine features, calcifications, and necrosis, high grade, ER/PR negative.   07/08/2019 Surgery   Left lumpectomy [name]) [phone]): high grade DCIS, 1.5cm, clear margins.   07/08/2019 Cancer Staging   Staging form: Breast, AJCC 8th Edition - Pathologic stage from 07/08/2019: Stage 0 (pTis (DCIS), pN0, cM0, ER-, PR-)   08/09/2019 Genetic Testing   Negative genetic testing on the [facility] 9-gene STAT panel.  The STAT Breast cancer panel offered by [facility] includes sequencing and rearrangement analysis for the following 9 genes:  ATM, BRCA1, BRCA2, CDH1, CHEK2, PALB2, PTEN, STK11 and TP53.   The report date is August 09, 2019.   08/11/2019 - 09/01/2019 Radiation Therapy   The patient initially received a dose of 42.72 Gy in 16 fractions to the breast using whole-breast tangent fields. This was delivered using a 3-D conformal technique. No boost. The total dose was 42.72 Gy.     CHIEF COMPLIANT:  Follow-up of left breast DCIS    INTERVAL  HISTORY: Kara [name] is a 77 y.o. with above-mentioned history of left breast DCIS who underwent a left lumpectomy, radiation, and is currently on surveillance. Mammogram on 05/27/2021 showed no evidence of malignancy. She presents to the clinic today for follow-up.     ALLERGIES:  is allergic to coconut (cocos nucifera), other, prednisone, petrolatum, and naproxen.  MEDICATIONS:  Current Outpatient Medications  Medication Sig Dispense Refill   acetaminophen (TYLENOL) 500 MG tablet Take 500 mg by mouth every 8 (eight) hours as needed for moderate pain.     albuterol (VENTOLIN HFA) 108 (90 Base) MCG/ACT inhaler INHALE 1 TO 2 PUFFS INTO THE LUNGS EVERY 6 HOURS AS NEEDED FOR WHEEZING OR SHORTNESS OF BREATH 6.7 g 2   Ascorbic Acid (VITAMIN C WITH ROSE HIPS) 500 MG tablet Take 500 mg by mouth daily.     brimonidine (ALPHAGAN) 0.2 % ophthalmic solution 1 drop 2 (two) times daily.     Calcium Carb-Cholecalciferol (CALCIUM 600 + D PO) Take 1 tablet by mouth daily.     Cholecalciferol (VITAMIN D) 50 MCG (2000 UT) CAPS Take 2,000 Units by mouth daily.     furosemide (LASIX) 40 MG tablet TAKE 1 TABLET(40 MG) BY MOUTH DAILY AS NEEDED FOR SWELLING 30 tablet 2   latanoprost (XALATAN) 0.005 % ophthalmic solution SMARTSIG:1 Drop(s) In Eye(s) Every Evening     Misc Natural Products (SAMBUCUS ELDERBERRY VITAMIN C MT) Take 1 each by mouth daily. Gummy     pantoprazole (PROTONIX) 40 MG tablet TAKE 1 TABLET(40 MG) BY MOUTH DAILY BEFORE BREAKFAST 30 tablet 5   sucralfate (CARAFATE) 1 GM/10ML suspension Take 10 mLs (1 g total) by mouth 4 (four) times daily -  with meals and at bedtime. 420 mL 1   SYNTHROID 112 MCG tablet TAKE 1 TABLET(112 MCG) BY MOUTH DAILY BEFORE BREAKFAST 90 tablet 1   No current facility-administered medications for this visit.    PHYSICAL EXAMINATION: ECOG PERFORMANCE STATUS: {CHL ONC ECOG PS:[phone]}  There were no vitals filed for this visit. There were no vitals filed for this  visit.  BREAST:*** No palpable masses or nodules in either right or left breasts. No palpable axillary supraclavicular or infraclavicular adenopathy no breast tenderness or nipple discharge. (exam performed in the presence of a chaperone)  LABORATORY DATA:  I have reviewed the data as listed    Latest Ref Rng & Units 08/17/2022   10:23 AM 04/25/2022    3:00 PM 05/23/2021   10:31 AM  CMP  Glucose 70 - 99 mg/dL 952  841  324   BUN 8 - 27 mg/dL Creatinine 0.57 - 1.00 mg/dL 4.01  0.27  2.53   Sodium 134 - 144 mmol/L 142  139  142   Potassium 3.5 - 5.2 mmol/L 4.4  3.8  4.9   Chloride 96 - 106 mmol/L 106  107  104   CO2 20 - 29 mmol/L Calcium 8.7 - 10.3 mg/dL 66.4  9.2  40.3   Total Protein 6.0 - 8.5 g/dL 7.1     Total Bilirubin 0.0 - 1.2 mg/dL 0.4     Alkaline Phos 44 - 121 IU/L 142     AST 0 - 40 IU/L 12     ALT 0 - 32 IU/L 12       Lab Results  Component Value Date   WBC 6.6 08/17/2022   HGB 14.2 08/17/2022   HCT 44.9 08/17/2022   MCV 81 08/17/2022   PLT 216 08/17/2022   NEUTROABS 3.4 08/17/2022    ASSESSMENT & PLAN:  No problem-specific Assessment & Plan notes found for this encounter.    No orders of the defined types were placed in this encounter.  The patient has a good understanding of the overall plan. she agrees with it. she will call with any problems that may develop before the next visit here. Total time spent: 30 mins including face to face time and time spent for planning, charting and co-ordination of care   Sherlyn Lick, CMA 09/01/22    I Janan Ridge am acting as a Neurosurgeon for [employer]  ***

## 2022-09-01 NOTE — Telephone Encounter (Signed)
Communication noted.  

## 2022-09-01 NOTE — Telephone Encounter (Signed)
noted 

## 2022-09-01 NOTE — Telephone Encounter (Signed)
PLEASE SEE PATIENT'S LATEST MESSAGE. SHE ASKED TO HOLD OFF ON PET-CT.  OV MAY 1 ACCEPTABLE. THANKS.

## 2022-09-04 ENCOUNTER — Inpatient Hospital Stay: Payer: Medicare Other | Attending: Hematology and Oncology | Admitting: Hematology and Oncology

## 2022-09-04 ENCOUNTER — Other Ambulatory Visit: Payer: Self-pay

## 2022-09-04 ENCOUNTER — Inpatient Hospital Stay: Payer: Medicare Other

## 2022-09-04 ENCOUNTER — Other Ambulatory Visit: Payer: Self-pay | Admitting: *Deleted

## 2022-09-04 VITALS — BP 114/50 | HR 94 | Temp 97.3°F | Resp 18 | Ht 66.0 in | Wt 223.1 lb

## 2022-09-04 DIAGNOSIS — R590 Localized enlarged lymph nodes: Secondary | ICD-10-CM | POA: Insufficient documentation

## 2022-09-04 DIAGNOSIS — Z886 Allergy status to analgesic agent status: Secondary | ICD-10-CM | POA: Diagnosis not present

## 2022-09-04 DIAGNOSIS — Z79899 Other long term (current) drug therapy: Secondary | ICD-10-CM | POA: Insufficient documentation

## 2022-09-04 DIAGNOSIS — D0512 Intraductal carcinoma in situ of left breast: Secondary | ICD-10-CM

## 2022-09-04 DIAGNOSIS — N200 Calculus of kidney: Secondary | ICD-10-CM

## 2022-09-04 DIAGNOSIS — R109 Unspecified abdominal pain: Secondary | ICD-10-CM | POA: Insufficient documentation

## 2022-09-04 DIAGNOSIS — Z79890 Hormone replacement therapy (postmenopausal): Secondary | ICD-10-CM | POA: Diagnosis not present

## 2022-09-04 DIAGNOSIS — Z888 Allergy status to other drugs, medicaments and biological substances status: Secondary | ICD-10-CM | POA: Diagnosis not present

## 2022-09-04 LAB — URINALYSIS, COMPLETE (UACMP) WITH MICROSCOPIC
Bilirubin Urine: NEGATIVE
Glucose, UA: NEGATIVE mg/dL
Hgb urine dipstick: NEGATIVE
Ketones, ur: NEGATIVE mg/dL
Leukocytes,Ua: NEGATIVE
Nitrite: NEGATIVE
Protein, ur: NEGATIVE mg/dL
Specific Gravity, Urine: 1.021 (ref 1.005–1.030)
pH: 5 (ref 5.0–8.0)

## 2022-09-04 NOTE — Assessment & Plan Note (Signed)
06/11/2019:Screening mammogram showed left breast calcifications. Diagnostic mammogram showed left breast calcifications spanning 1.1cm. Biopsy showed DCIS with apocrine features, calcifications, and necrosis, high grade, ER/PR negative.   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 09/01/2019: Completed adjuvant radiation   Current treatment: Surveillance Breast cancer surveillance: Mammogram 06/14/2022: Benign breast density category B Breast exams 09/04/2022: Benign   Return to clinic on an as-needed basis as the patient follows with Dr. Carolynne [name] twice a year and gets mammograms annually.

## 2022-09-05 ENCOUNTER — Ambulatory Visit: Payer: Medicare Other | Admitting: Gastroenterology

## 2022-09-05 ENCOUNTER — Other Ambulatory Visit: Payer: Self-pay | Admitting: *Deleted

## 2022-09-05 LAB — URINE CULTURE

## 2022-09-05 MED ORDER — CIPROFLOXACIN HCL 500 MG PO TABS: 500.0000 mg | ORAL_TABLET | Freq: Two times a day (BID) | ORAL | 0 refills | Status: DC

## 2022-09-05 NOTE — Progress Notes (Signed)
Per MD pt recent UA showing UTI.  Verbal orders received for pt to start CIPRO 500 mg p.o BID x1 week.  Prescription sent to pharmacy on file, pt educated and verbalized understanding.

## 2022-09-12 ENCOUNTER — Encounter: Payer: Self-pay | Admitting: Internal Medicine

## 2022-09-13 ENCOUNTER — Encounter: Payer: Self-pay | Admitting: Internal Medicine

## 2022-09-13 ENCOUNTER — Ambulatory Visit (INDEPENDENT_AMBULATORY_CARE_PROVIDER_SITE_OTHER): Payer: Medicare Other | Admitting: Internal Medicine

## 2022-09-13 VITALS — BP 105/60 | HR 81 | Temp 97.5°F | Ht 66.0 in | Wt 224.7 lb

## 2022-09-13 DIAGNOSIS — R748 Abnormal levels of other serum enzymes: Secondary | ICD-10-CM | POA: Diagnosis not present

## 2022-09-13 DIAGNOSIS — K219 Gastro-esophageal reflux disease without esophagitis: Secondary | ICD-10-CM | POA: Diagnosis not present

## 2022-09-13 DIAGNOSIS — R1011 Right upper quadrant pain: Secondary | ICD-10-CM

## 2022-09-13 DIAGNOSIS — R198 Other specified symptoms and signs involving the digestive system and abdomen: Secondary | ICD-10-CM

## 2022-09-13 NOTE — Progress Notes (Signed)
Referring Provider: Anabel Halon, MD Primary Care Physician:  Anabel Halon, MD Primary GI:  Dr. Marletta Lor  Chief Complaint  Patient presents with   Abdominal Pain    Patient here today to discuss her RUQ pain. Patient says she has this pain after everything she eats and drinks. Pain is often noticed when she lays down on her Rt side.She says she has had EGD & Tcs done by Dr. Marletta Lor 05/02/22 Ct abd/ pelvis on 08/24/22 : IMPRESSION: No acute process within the abdomen or pelvis.   Mild pelvic lymphadenopathy in bilateral iliac regions. If there is no history of malignancy or clinical signs of lymphoproliferative disorder, consider followup by CT in 3 months.     HPI:   Kara [name] is a 77 y.o. female who presents to clinic today for follow up visit. She has a history of abdominal pain, GERD, adenomatous colon polyps.   EGD 04/2022: -2cm hh -mild Schatzki ring -gastritis, reactive gastropathy, no H.pylori -congested ampulla, mild acute duodenitis   Colonoscopy 04/2022: -diverticulosis -five polyps removed, tubular adenomas -next colonoscopy in 3 years  She has had chronic intermittent right upper quadrant pain off and on for years.  Worse with certain foods.  Sore to touch, burning in quality.  Pepcid helped a little previously.  Gallbladder removed remotely for this pain but did not make it any better.  Took pantoprazole for without improvement.   CT abdomen pelvis with contrast 08/24/2022 which I personally reviewed showed mild pelvic lymphadenopathy bilateral iliac regions consider follow-up CT versus PET/CT or tissue sampling.  Followed up with her oncologist who recommended repeat CT in 3 months. Checked a UA which showed UTI, completed course of Cipro.   Today, continues to complain of right upper quadrant pain daily.  Bowels moving well.   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  The STAT Breast cancer panel offered by [facility] includes sequencing and rearrangement analysis for the following 9 genes:  ATM, BRCA1, BRCA2, CDH1, CHEK2, PALB2, PTEN, STK11 and TP53.   The report date is August 09, 2019.   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 fibroids   BIOPSY  05/02/2022   Procedure: BIOPSY;  Surgeon: Lanelle Bal, DO;  [facility]: [facility];  Service: Endoscopy;;   BREAST BIOPSY Left 05/2019   left breast DCIS   BREAST LUMPECTOMY Left 06/2019   DCIS   BREAST LUMPECTOMY WITH RADIOACTIVE SEED LOCALIZATION Left 07/08/2019   Procedure: LEFT BREAST LUMPECTOMY WITH RADIOACTIVE SEED LOCALIZATION;  Surgeon: Griselda Miner, MD;  Location: [facility];  Service: General;  Laterality: Left;   BREAST SURGERY N/A    [employer] 08/30/2020   CESAREAN SECTION     CESAREAN SECTION N/A    [employer] 08/30/2020   CHOLECYSTECTOMY     COLONOSCOPY  2011   [place]: two 3-4 mm sessile polyps, hyperplastic, sigmoid diverticula, TI appeared normal.    COLONOSCOPY WITH PROPOFOL N/A 05/02/2022   Procedure: COLONOSCOPY WITH PROPOFOL;  Surgeon: Lanelle Bal, DO;  Location: [facility];  Service: Endoscopy;  Laterality: N/A;  11:30 am   ESOPHAGOGASTRODUODENOSCOPY  2011   [place]: non-bleeding erosive gastropathy, normal duodenum. path: unremarkable duodenum, negative H.pylori, minimal esophagitis    ESOPHAGOGASTRODUODENOSCOPY (EGD) WITH PROPOFOL N/A 05/02/2022   Procedure: ESOPHAGOGASTRODUODENOSCOPY (EGD) WITH PROPOFOL;  Surgeon: Lanelle Bal, DO;  [facility]: [facility];  Service: Endoscopy;  Laterality: N/A;   POLYPECTOMY  05/02/2022   Procedure: POLYPECTOMY;  Surgeon: Lanelle Bal, DO;  Location: [facility];  Service: Endoscopy;;   TONSILLECTOMY  1975    Current Outpatient Medications  Medication Sig Dispense Refill   acetaminophen (TYLENOL) 500 MG tablet Take 500 mg by mouth every 8 (eight) hours as needed for moderate pain.     albuterol (VENTOLIN HFA) 108 (90 Base) MCG/ACT inhaler INHALE 1 TO 2 PUFFS INTO THE LUNGS EVERY 6 HOURS AS NEEDED FOR WHEEZING OR SHORTNESS OF BREATH 6.7 g 2   Ascorbic Acid (VITAMIN C WITH ROSE HIPS) 500 MG tablet Take 500 mg by mouth daily.     brimonidine (ALPHAGAN) 0.2 % ophthalmic solution 1 drop 2 (two) times daily.     Calcium Carb-Cholecalciferol (CALCIUM 600 + D PO) Take 1 tablet by mouth daily.     Cholecalciferol (VITAMIN D) 50 MCG (2000 UT) CAPS Take 2,000 Units by mouth daily.     furosemide (LASIX) 40 MG tablet TAKE 1 TABLET(40 MG) BY MOUTH DAILY AS NEEDED FOR SWELLING 30 tablet 2   latanoprost (XALATAN) 0.005 % ophthalmic solution SMARTSIG:1  Drop(s) In Eye(s) Every Evening     Misc Natural Products (SAMBUCUS ELDERBERRY VITAMIN C MT) Take 1 each by mouth daily. Gummy     SYNTHROID 112 MCG tablet TAKE 1 TABLET(112 MCG) BY MOUTH DAILY BEFORE BREAKFAST 90 tablet 1   pantoprazole (PROTONIX) 40 MG tablet TAKE 1 TABLET(40 MG) BY MOUTH DAILY BEFORE BREAKFAST (Patient not taking: Reported on 09/13/2022) 30 tablet 5   No current facility-administered medications for this visit.    Allergies as of 09/13/2022 - Review Complete 09/13/2022  Allergen Reaction Noted   Coconut (cocos nucifera) Anaphylaxis 06/30/2020   Other Shortness Of Breath 01/20/2011   Prednisone Hypertension 12/03/2018   Petrolatum Dermatitis 04/28/2022   Naproxen Other (See Comments) 11/09/2015    Family History  Problem Relation Age of Onset   Breast cancer Maternal Grandmother        dx over 68   Prostate cancer Maternal Grandfather  Arthritis Mother    COPD Mother    Breast cancer Mother 26       second at age 52   Cancer Father        throat   Stroke Father    Cancer Maternal Aunt 45       [name], cervical cancer   Breast cancer Maternal Aunt        dx over 92   Breast cancer Paternal Aunt        dx under 50   Prostate cancer Maternal Uncle        dx over 50   Cancer Other        father's maternal cousin was Henreietta Lacks, Cervical   Cancer Maternal Uncle        unknown cancer   Breast cancer Paternal Aunt        dx over 68   Colon cancer Neg Hx    Allergic rhinitis Neg Hx    Angioedema Neg Hx    Asthma Neg Hx    Atopy Neg Hx    Eczema Neg Hx    Urticaria Neg Hx    Immunodeficiency Neg Hx     Social History   Socioeconomic History   Marital status: Divorced    Spouse name: Not on file   Number of children: 2   Years of education: 15   Highest education level: Not on file  Occupational History   Occupation: retired    Comment: Loss adjuster, chartered at a Database administrator  Tobacco Use   Smoking status: Former     Types: Cigarettes    Quit date: 06/15/1998    Years since quitting: 24.2   Smokeless tobacco: Never   Tobacco comments:    quit 2002  Vaping Use   Vaping Use: Never used  Substance and Sexual Activity   Alcohol use: No   Drug use: No   Sexual activity: Not Currently    Birth control/protection: Surgical    Comment: hyst  Other Topics Concern   Not on file  Social History Narrative   Lives alone   2 grown children- one in [place] and one here   Grandchildren       Moved to [place] from [place] for aging mother who is 67 with Alzheimers      Maternal Aunt is Animator of the [employer] cancer call line      Enjoy: gym, time with friends       Diet: eats all food groups   Caffeine: 2 diet cokes daily    Water: 2-4 cups daily       Wears seat belt    Does not use phone while driving    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   [employer]    No weapons       Right Handed    Lives alone in a one story home    Retired   International aid/development worker of Health   Financial Resource Strain: Low Risk  (10/06/2020)   Overall Financial Resource Strain (CARDIA)    Difficulty of Paying Living Expenses: Not hard at all  Food Insecurity: No Food Insecurity (10/06/2020)   Hunger Vital Sign    Worried About Running Out of Food in the Last Year: Never true    Ran Out of Food in the Last Year: Never true  Transportation Needs: No Transportation Needs (10/06/2020)   PRAPARE - Administrator, Civil Service (Medical):  No    Lack of Transportation (Non-Medical): No  Physical Activity: Sufficiently Active (10/06/2020)   Exercise Vital Sign    Days of Exercise per Week: 7 days    Minutes of Exercise per Session: 30 min  Stress: No Stress Concern Present (10/06/2020)   [employer] of Occupational Health - Occupational Stress Questionnaire    Feeling of Stress : Not at all  Social Connections: Moderately Isolated (10/06/2020)   Social Connection and Isolation Panel [NHANES]     Frequency of Communication with Friends and Family: More than three times a week    Frequency of Social Gatherings with Friends and Family: More than three times a week    Attends Religious Services: Never    Database administrator or Organizations: Yes    Attends Engineer, structural: More than 4 times per year    Marital Status: Divorced    Subjective: Review of Systems  Constitutional:  Negative for chills and fever.  HENT:  Negative for congestion and hearing loss.   Eyes:  Negative for blurred vision and double vision.  Respiratory:  Negative for cough and shortness of breath.   Cardiovascular:  Negative for chest pain and palpitations.  Gastrointestinal:  Positive for abdominal pain. Negative for blood in stool, constipation, diarrhea, heartburn, melena and vomiting.  Genitourinary:  Negative for dysuria and urgency.  Musculoskeletal:  Negative for joint pain and myalgias.  Skin:  Negative for itching and rash.  Neurological:  Negative for dizziness and headaches.  Psychiatric/Behavioral:  Negative for depression. The patient is not nervous/anxious.      Objective: BP 105/60 (BP Location: Left Arm, Patient Position: Sitting, Cuff Size: Large)   Pulse 81   Temp (!) 97.5 F (36.4 C) (Temporal)   Ht 5\' 6"  (1.676 m)   Wt 224 lb 11.2 oz (101.9 kg)   BMI 36.27 kg/m  Physical Exam Constitutional:      Appearance: Normal appearance.  HENT:     Head: Normocephalic and atraumatic.  Eyes:     Extraocular Movements: Extraocular movements intact.     Conjunctiva/sclera: Conjunctivae normal.  Cardiovascular:     Rate and Rhythm: Normal rate and regular rhythm.  Pulmonary:     Effort: Pulmonary effort is normal.     Breath sounds: Normal breath sounds.  Abdominal:     General: Bowel sounds are normal.     Palpations: Abdomen is soft.  Musculoskeletal:        General: No swelling. Normal range of motion.     Cervical back: Normal range of motion and neck supple.   Skin:    General: Skin is warm and dry.     Coloration: Skin is not jaundiced.  Neurological:     General: No focal deficit present.     Mental Status: She is alert and oriented to person, place, and time.  Psychiatric:        Mood and Affect: Mood normal.        Behavior: Behavior normal.      Assessment: *Right upper quadrant pain-chronic *Elevated alk phos *Chronic GERD *Prominent ampulla  Plan: Etiology of her ongoing right upper quadrant pain unclear.  Not improved with PPI therapy and Pepcid.  Does have elevated GGT, alk phos, prominent ampulla on upper endoscopy.  Will reach out to Dr. Meridee Score to see if patient would benefit from endoscopic ultrasound.  May need MRI/MRCP prior.  I do think a component of her right-sided pain is musculoskeletal though this would  not explain her postprandial right upper quadrant pain..  Recommend heating pad.  Can use topical salves as needed.  Follow-up in 2 to 3 months.     09/13/2022 3:49 PM   Disclaimer: This note was dictated with voice recognition software. Similar sounding words can inadvertently be transcribed and may not be corrected upon review.

## 2022-09-13 NOTE — Patient Instructions (Signed)
I am going to reach out to Dr. Meridee Score in [place] about your EGD findings.  Once I have heard back from him we will let you know.  It was very nice seeing you again today.  Dr. Marletta Lor

## 2022-09-14 ENCOUNTER — Telehealth: Payer: Self-pay

## 2022-09-14 ENCOUNTER — Telehealth: Payer: Self-pay | Admitting: Internal Medicine

## 2022-09-14 NOTE — Telephone Encounter (Signed)
Called pt and made aware of recs. She states she needs to think about having the MRI/MRCP done. I advised to call back and let me know. Also aware to expect a call from [facility]. She voiced understanding. FYI to Dr. Marletta Lor

## 2022-09-14 NOTE — Telephone Encounter (Signed)
-----   Message from Lemar Lofty., MD sent at 09/13/2022  5:08 PM EDT ----- Jonathon Bellows, Thanks for reaching out. I think MRI/MRCP makes sense, but we know that small stones can be missed even on MRI/MRCP so micro choledocholithiasis could be missed. It is going to be quite a few weeks away so I think MRI/MRCP makes sense to initiate. I will have Bhavya Eschete go ahead and start working on getting an EUS date available but again would move forward with imaging in the interim. Thanks. GM  Jacen Carlini, This patient can be set up for a nonurgent EUS rule out choledocholithiasis/abdominal pain. Dr. Queen Blossom team will set up an MRI/MRCP and forward those results to me but we can go ahead and get her on the schedule. Thanks. GM ----- Message ----- From: Lanelle Bal, DO Sent: 09/13/2022   4:20 PM EDT To: Lemar Lofty., MD  GM,  Wanted to run this patient by you.  She has chronic right upper quadrant abdominal pain.  Occasional radiation to her right shoulder.  Status post cholecystectomy. Worse after meals.   Underwent EGD which showed mild gastritis, negative for H. pylori.  Trialed on Pepcid and pantoprazole without improvement in her symptoms.  Her ampulla did appear edematous on EGD.  You can see pictures in my report.  I took biopsies which just showed duodenitis.  She does have elevated alk phos and GGT.  T. bili has been normal.  No biliary dilatation on CT imaging.  Wanted to see if you thought she would be a candidate for nonurgent endoscopic ultrasound?  Could also consider MRI/MRCP first if you think this would be more beneficial. As always, thanks for your help.  Hope you are well, KC

## 2022-09-14 NOTE — Telephone Encounter (Signed)
Please let patient know that I heard back from Dr. Meridee Score who is recommending MRI MRCP to further evaluate.  If patient is agreeable please set this up.  Diagnosis abnormal LFTs, abnormal ampulla.  He does think she will eventually need endoscopic ultrasound, so his office is going to go ahead and set up a date for this.  They should be in contact with her.  Thank you

## 2022-09-15 ENCOUNTER — Other Ambulatory Visit: Payer: Self-pay

## 2022-09-15 DIAGNOSIS — R1011 Right upper quadrant pain: Secondary | ICD-10-CM

## 2022-09-15 DIAGNOSIS — R7989 Other specified abnormal findings of blood chemistry: Secondary | ICD-10-CM

## 2022-09-15 NOTE — Telephone Encounter (Signed)
EUS scheduled, pt instructed and medications reviewed.  Patient instructions mailed to home.  Patient to call with any questions or concerns.  

## 2022-09-15 NOTE — Telephone Encounter (Signed)
EUS has been scheduled for 11/23/22 at 115 pm with GM at [facility]   Left message on machine to call back

## 2022-09-17 ENCOUNTER — Encounter: Payer: Self-pay | Admitting: Internal Medicine

## 2022-09-18 NOTE — Addendum Note (Signed)
Addended byTrena Platt on: 09/18/2022 12:17 PM   Modules accepted: Orders

## 2022-09-22 ENCOUNTER — Ambulatory Visit: Payer: Medicare Other | Admitting: Neurology

## 2022-09-26 ENCOUNTER — Encounter: Payer: Self-pay | Admitting: Internal Medicine

## 2022-10-05 ENCOUNTER — Telehealth: Payer: Self-pay | Admitting: Gastroenterology

## 2022-10-05 NOTE — Telephone Encounter (Signed)
The pt has been advised that no sooner appt available at this time.  She will be called if something becomes available.

## 2022-10-05 NOTE — Telephone Encounter (Signed)
Patient calling requesting a sooner appt when one becomes available.

## 2022-10-07 ENCOUNTER — Ambulatory Visit
Admission: EM | Admit: 2022-10-07 | Discharge: 2022-10-07 | Disposition: A | Discharge status: Home / Self Care | Payer: Medicare Other | Attending: Nurse Practitioner | Admitting: Nurse Practitioner

## 2022-10-07 DIAGNOSIS — H1013 Acute atopic conjunctivitis, bilateral: Secondary | ICD-10-CM | POA: Diagnosis not present

## 2022-10-07 IMAGING — MG DIGITAL DIAGNOSTIC BILAT W/ TOMO W/ CAD
6 of 9 series · 6 of 25 positions shown · non-contrast
Comparison: Previous exam(s).

CLINICAL DATA: 75-year-old female for annual follow-up. History of
LEFT breast cancer and lumpectomy in 8085.

EXAM:
DIGITAL DIAGNOSTIC BILATERAL MAMMOGRAM WITH CAD AND TOMO

[L CC]
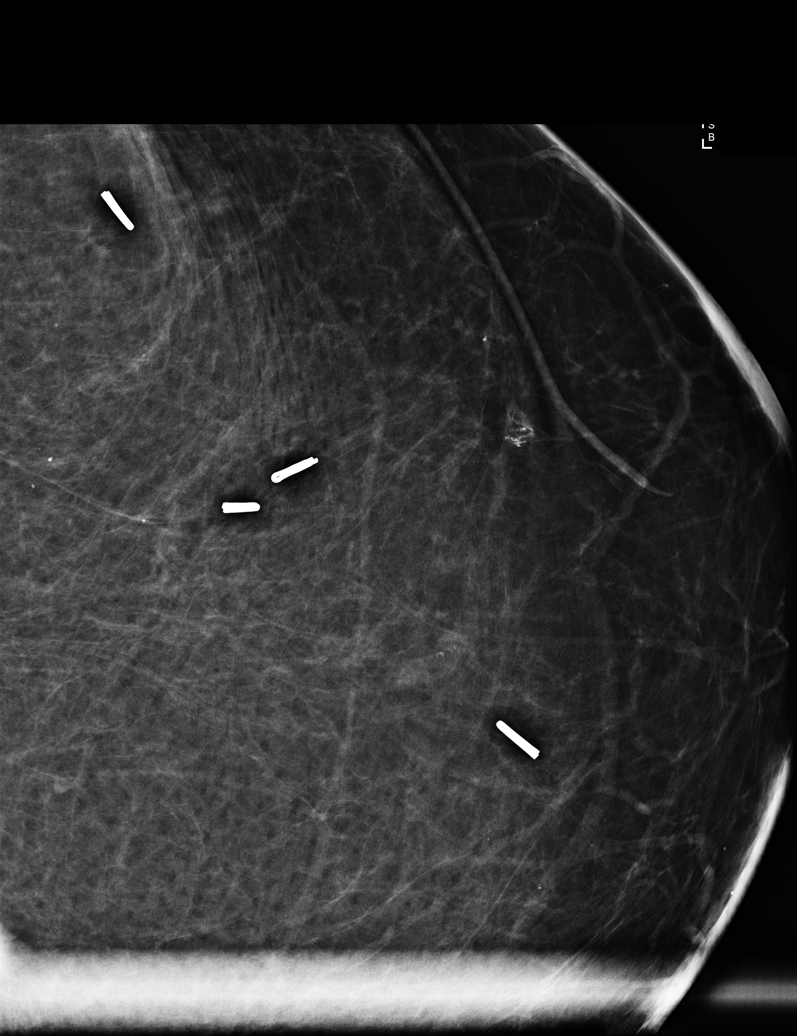

[L MLO synth-2D]
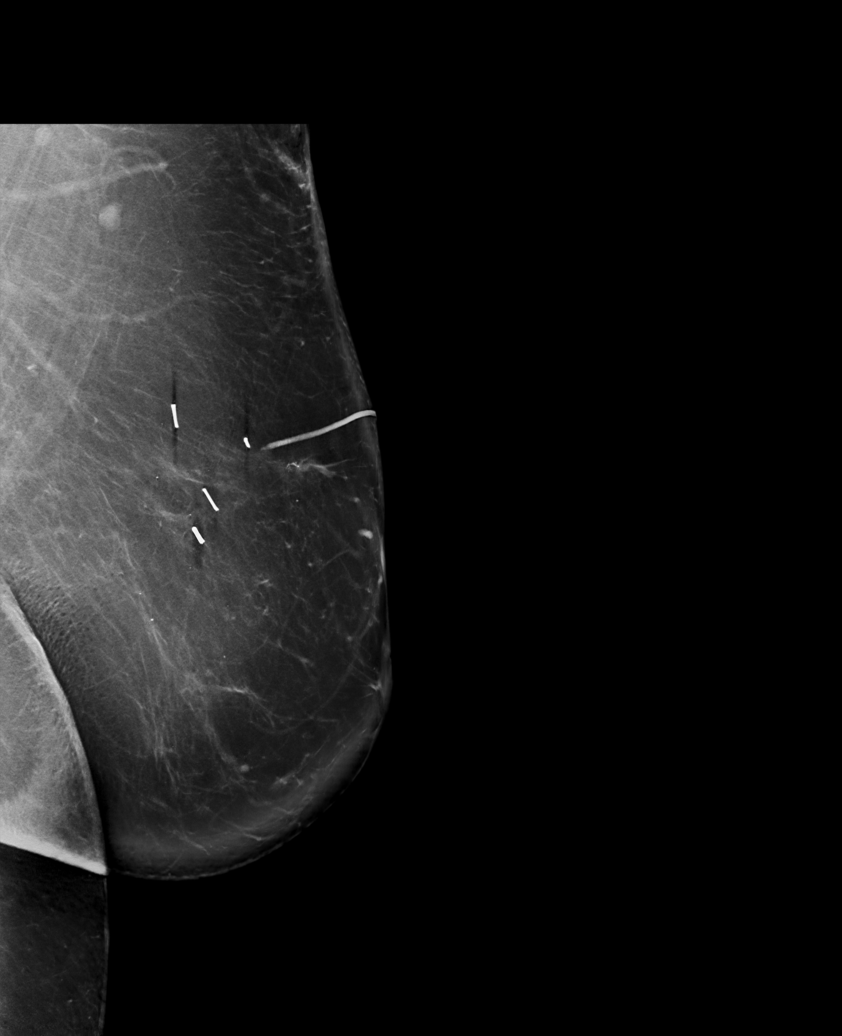

[L CC synth-2D]
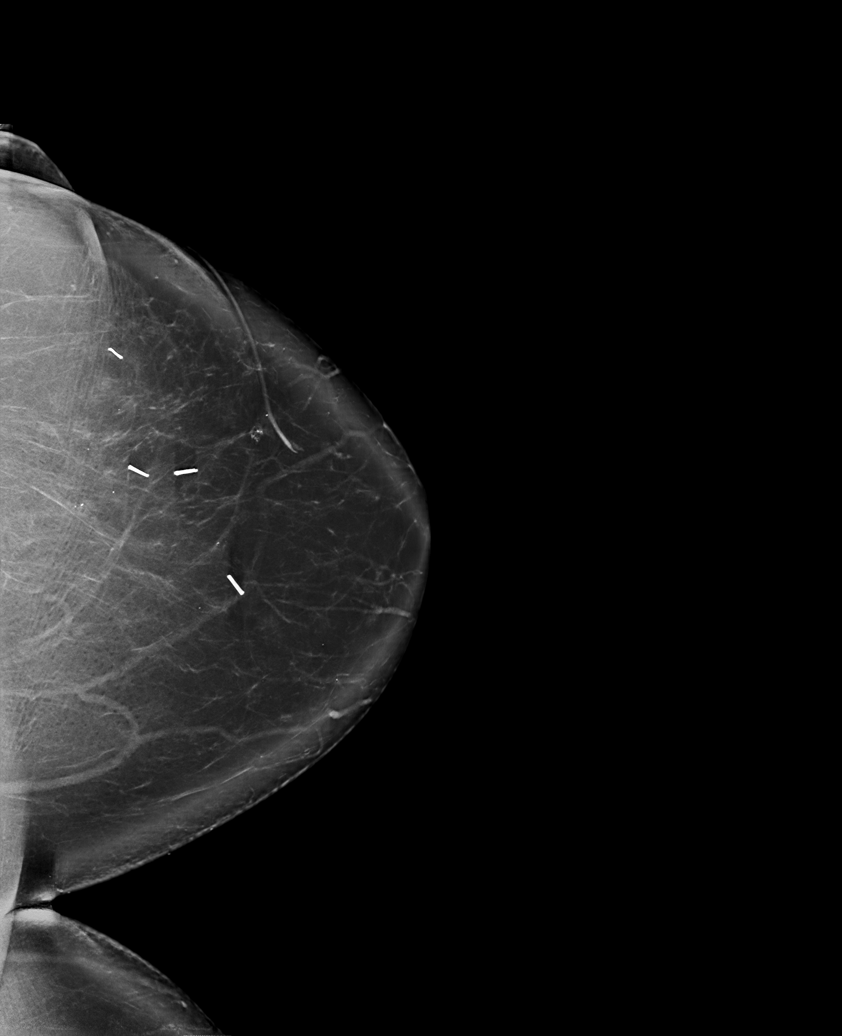

[R MLO synth-2D]
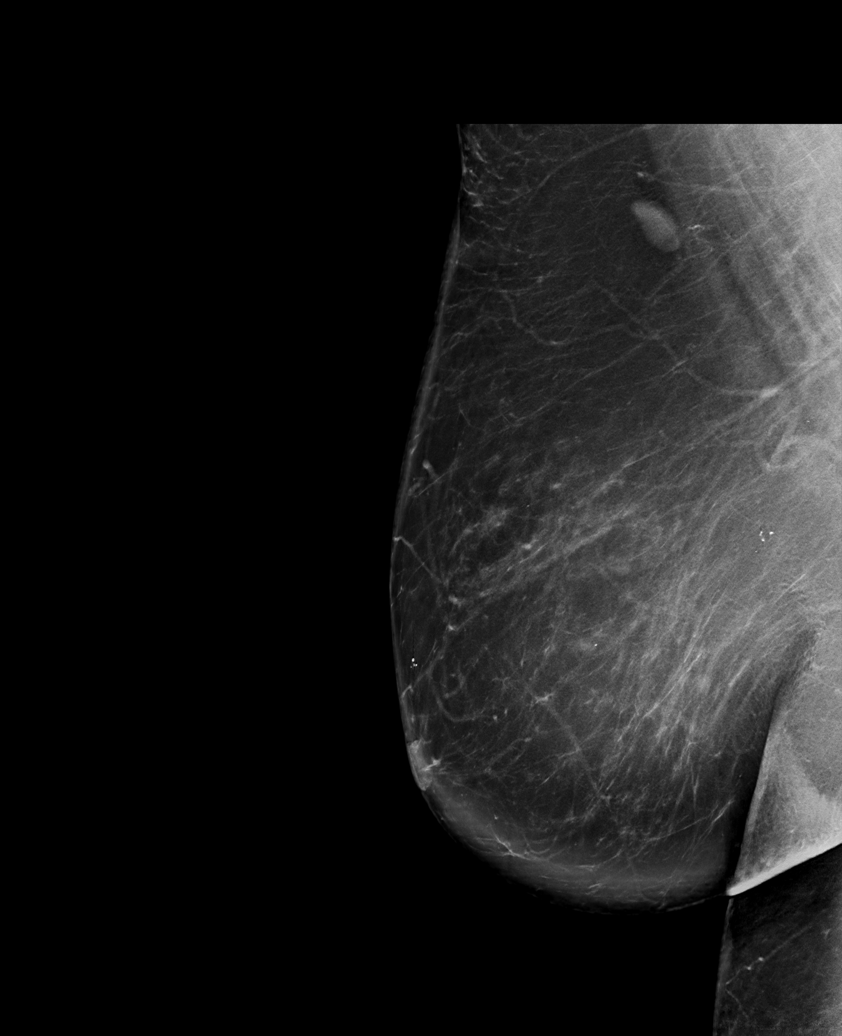

[R CC synth-2D]
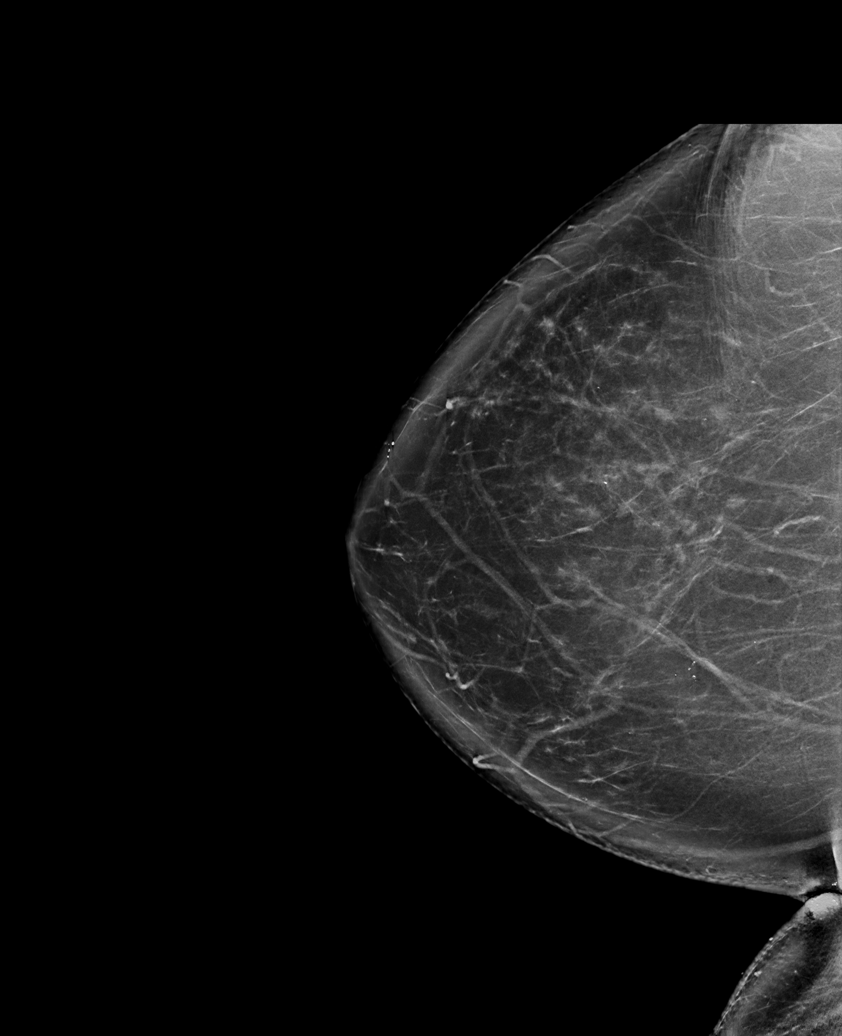

[R MLO tomo · tomo slice 58/115.0]
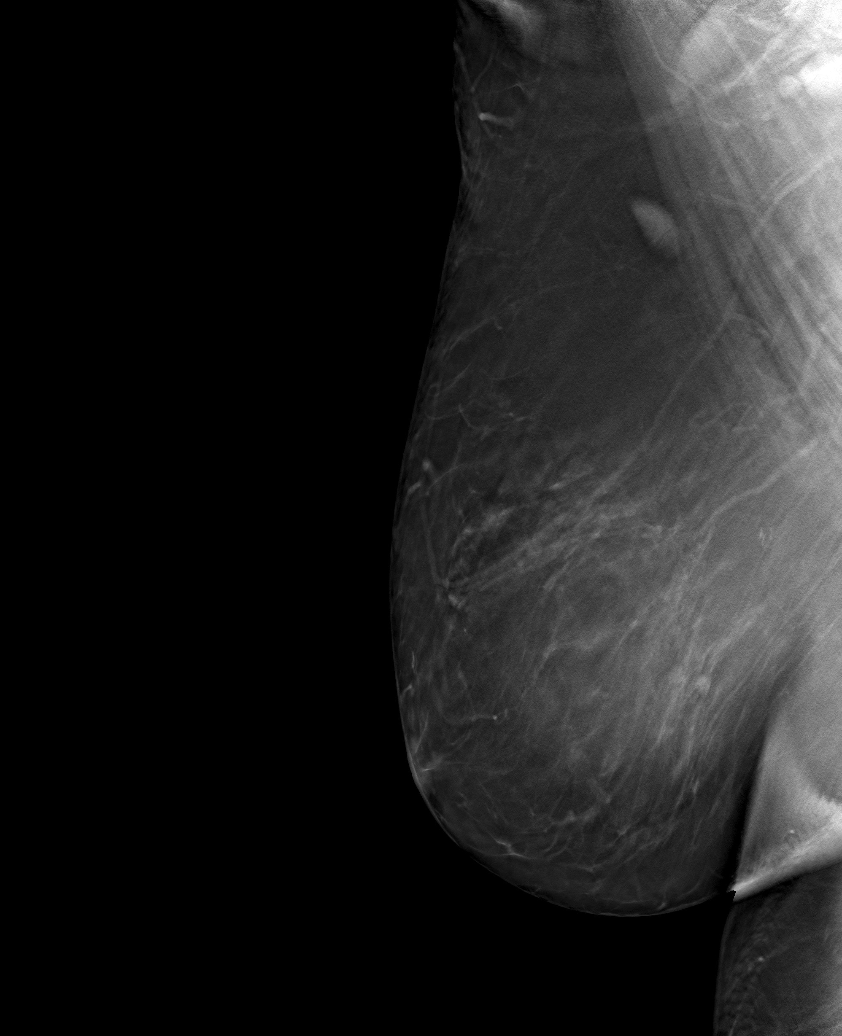

[6 of 25 positions shown; findings below may reference images not displayed]

ACR Breast Density Category b: There are scattered areas of
fibroglandular density.
FINDINGS: 2D and 3D full field views of both breasts and a magnification view
of the lumpectomy site demonstrate no suspicious mass, nonsurgical
distortion or worrisome calcifications.

LEFT lumpectomy changes are again noted.

Mammographic images were processed with CAD.
IMPRESSION: No evidence of breast malignancy.

RECOMMENDATION:
Per protocol, as the patient is now 2 or more years status post
lumpectomy, she may return to annual screening mammography in 1
year. However, given the history of breast cancer, the patient
remains eligible for annual diagnostic mammography if preferred.
(Code:UF-9-Q14)

I have discussed the findings and recommendations with the patient.
If applicable, a reminder letter will be sent to the patient
regarding the next appointment.

BI-RADS CATEGORY  2: Benign.

## 2022-10-07 MED ORDER — OLOPATADINE HCL 0.1 % OP SOLN: 1.0000 [drp] | Freq: Two times a day (BID) | OPHTHALMIC | 0 refills | Status: DC

## 2022-10-07 NOTE — Discharge Instructions (Addendum)
Apply eyedrops as prescribed. Recommend an over-the-counter antihistamine such as Zyrtec, Claritin, or Allegra while symptoms persist. Cool compresses to the eyes to help with pain, itching, swelling, and discomfort. Continue your current medications for glaucoma.  If you begin using the medication and you experience changes in your vision, please stop the medication immediately and follow-up with your eye doctor. If symptoms continue to persist, and you are not finding relief with the medication, please follow-up with your eye doctor for further evaluation. Follow-up as needed.

## 2022-10-07 NOTE — ED Triage Notes (Signed)
Pt c/o pink eye sx's, crusting over burning. Redness to the eyes x 7 days

## 2022-10-07 NOTE — ED Provider Notes (Signed)
[facility]    CSN: [id number] Arrival date & time: 10/07/22  1357      History   Chief Complaint No chief complaint on file.   HPI Kara [name] is a 77 y.o. female.   The history is provided by the patient.   The patient presents for complaints of bilateral eye redness, burning, itchiness, and irritation has been present for the past 7 days.  She states when she woke up this morning, she also had drainage in the eyes.  Patient denies fever, chills, nasal congestion, runny nose, cough, decreased vision, blurred vision, light sensitivity, or headache.  Patient reports that she does have a history of glaucoma and dry eyes.  She states that she feels like her vision "has changed since her symptoms started.  She states that she wears glasses.  Patient denies any obvious known sick contacts, along with history of seasonal allergies.  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  The STAT Breast cancer panel offered by [facility] includes sequencing and rearrangement analysis for the following 9 genes:  ATM, BRCA1, BRCA2, CDH1, CHEK2, PALB2, PTEN, STK11 and TP53.   The report date is August 09, 2019.   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   Perimenopausal vasomotor symptoms 02/11/2016   Personal history of radiation therapy 2021   completed left breast radiation in April 2021   Postmenopausal atrophic vaginitis 11/09/2015   Pre-diabetes    no meds   Prediabetes 08/30/2017   Thyroid disease    Grave's   Vaginal discharge 01/16/2019   Weight loss, unintentional 03/27/2019    Patient Active Problem List   Diagnosis Date Noted   Sleep apnea 07/21/2022   H/o Lyme disease 07/21/2022   Hypotension 07/03/2022   Idiopathic peripheral neuropathy 04/12/2022   RUQ pain 04/05/2022   Encounter for screening colonoscopy 04/05/2022   Leg swelling 05/25/2021   Vitamin D deficiency 03/11/2021   Plantar fasciitis of right foot 10/13/2020   Trigger middle finger of right hand 10/13/2020   Intermittent palpitations 04/29/2020   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 fibroids   BIOPSY  05/02/2022   Procedure: BIOPSY;  Surgeon: Lanelle Bal, DO;  [facility]: [facility];  Service: Endoscopy;;   BREAST BIOPSY Left 05/2019   left breast DCIS   BREAST LUMPECTOMY Left 06/2019   DCIS   BREAST LUMPECTOMY WITH RADIOACTIVE SEED LOCALIZATION Left 07/08/2019   Procedure: LEFT BREAST LUMPECTOMY WITH RADIOACTIVE SEED LOCALIZATION;  Surgeon: Griselda Miner,  MD;  Location: [facility];  Service: General;  Laterality: Left;   BREAST SURGERY N/A    Phreesia 08/30/2020   CESAREAN SECTION     CESAREAN SECTION N/A    [employer]reesia 08/30/2020   CHOLECYSTECTOMY     COLONOSCOPY  2011   [place]: two 3-4 mm sessile polyps, hyperplastic, sigmoid diverticula, TI appeared normal.    COLONOSCOPY WITH PROPOFOL N/A 05/02/2022   Procedure: COLONOSCOPY WITH PROPOFOL;  Surgeon: Lanelle Bal, DO;  Location: [facility];  Service: Endoscopy;  Laterality: N/A;  11:30 am   ESOPHAGOGASTRODUODENOSCOPY  2011   [place]: non-bleeding erosive gastropathy, normal duodenum. path: unremarkable duodenum, negative H.pylori, minimal esophagitis    ESOPHAGOGASTRODUODENOSCOPY (EGD) WITH PROPOFOL N/A 05/02/2022   Procedure: ESOPHAGOGASTRODUODENOSCOPY (EGD) WITH PROPOFOL;  Surgeon: Lanelle Bal, DO;  Location: [facility];  Service: Endoscopy;  Laterality: N/A;   POLYPECTOMY  05/02/2022   Procedure: POLYPECTOMY;  Surgeon: Lanelle Bal, DO;  Location: [facility];  Service: Endoscopy;;   TONSILLECTOMY  1975    OB History     Gravida  3   Para  2   Term      Preterm      AB  1   Living         SAB  1   IAB      Ectopic      Multiple      Live Births               Home Medications    Prior to Admission medications   Medication Sig Start Date End Date Taking? Authorizing Provider  olopatadine (PATADAY) 0.1 % ophthalmic solution Place 1 drop into both eyes 2 (two) times daily. 10/07/22  Yes Gay Rape-Warren, Sadie Haber, NP  acetaminophen (TYLENOL) 500 MG tablet Take 500 mg by mouth every 8 (eight) hours as needed for moderate pain.    [provider]  albuterol (VENTOLIN HFA) 108 (90 Base) MCG/ACT inhaler INHALE 1 TO 2 PUFFS INTO THE LUNGS EVERY 6 HOURS AS NEEDED FOR WHEEZING OR SHORTNESS OF BREATH 10/14/21   Anabel Halon, MD  Ascorbic Acid (VITAMIN C WITH ROSE HIPS) 500 MG tablet Take 500 mg by mouth daily.     [provider]  brimonidine (ALPHAGAN) 0.2 % ophthalmic solution 1 drop 2 (two) times daily. 06/07/22   [provider]  Calcium Carb-Cholecalciferol (CALCIUM 600 + D PO) Take 1 tablet by mouth daily.    [provider]  Cholecalciferol (VITAMIN D) 50 MCG (2000 UT) CAPS Take 2,000 Units by mouth daily.    [provider]  furosemide (LASIX) 40 MG tablet TAKE 1 TABLET(40 MG) BY MOUTH DAILY AS NEEDED FOR SWELLING 07/19/22   Anabel Halon, MD  latanoprost (XALATAN) 0.005 % ophthalmic solution SMARTSIG:1 Drop(s) In Eye(s) Every Evening 06/06/22   [provider]  Misc Natural Products (SAMBUCUS ELDERBERRY VITAMIN C MT)  Take 1 each by mouth daily. Gummy    [provider]  pantoprazole (PROTONIX) 40 MG tablet TAKE 1 TABLET(40 MG) BY MOUTH DAILY BEFORE BREAKFAST Patient not taking: Reported on 09/13/2022 06/01/22   Tiffany Kocher, PA-C  SYNTHROID 112 MCG tablet TAKE 1 TABLET(112 MCG) BY MOUTH DAILY BEFORE BREAKFAST 05/23/22   Roma Kayser, MD    Family History Family History  Problem Relation Age of Onset   Breast cancer Maternal Grandmother        dx over 54   Prostate cancer Maternal Grandfather    Arthritis Mother    COPD Mother    Breast cancer Mother 28       second at age 40   Cancer Father        throat   Stroke Father    Cancer Maternal Aunt 30       [name], cervical cancer   Breast cancer Maternal Aunt        dx over 56   Breast cancer Paternal Aunt        dx under 50   Prostate cancer Maternal Uncle        dx over 86   Cancer Other        father's maternal cousin was Henreietta Lacks, Cervical   Cancer Maternal Uncle        unknown cancer   Breast cancer Paternal Aunt        dx over 107   Colon cancer Neg Hx    Allergic rhinitis Neg Hx    Angioedema Neg Hx    Asthma Neg Hx    Atopy Neg Hx    Eczema Neg Hx    Urticaria Neg Hx    Immunodeficiency Neg Hx     Social History Social History   Tobacco  Use   Smoking status: Former    Types: Cigarettes    Quit date: 06/15/1998    Years since quitting: 24.3   Smokeless tobacco: Never   Tobacco comments:    quit 2002  Vaping Use   Vaping Use: Never used  Substance Use Topics   Alcohol use: No   Drug use: No     Allergies   Coconut (cocos nucifera), Other, Prednisone, Petrolatum, and Naproxen   Review of Systems Review of Systems Per HPI  Physical Exam Triage Vital Signs ED Triage Vitals  Enc Vitals Group     BP 10/07/22 1405 115/74     Pulse Rate 10/07/22 1405 77     Resp 10/07/22 1405 16     Temp 10/07/22 1405 97.9 F (36.6 C)     Temp Source 10/07/22 1405 Oral     SpO2 10/07/22 1405 97 %     Weight --      Height --      Head Circumference --      Peak Flow --      Pain Score 10/07/22 1406 0     Pain Loc --      Pain Edu? --      Excl. in GC? --    No data found.  Updated Vital Signs BP 115/74 (BP Location: Right Arm)   Pulse 77   Temp 97.9 F (36.6 C) (Oral)   Resp 16   SpO2 97%   Visual Acuity Right Eye Distance:   Left Eye Distance:   Bilateral Distance:    Right Eye Near:   Left Eye Near:    Bilateral Near:  Physical Exam Vitals and nursing note reviewed.  Constitutional:      General: She is not in acute distress.    Appearance: Normal appearance.  HENT:     Head: Normocephalic.     Right Ear: Tympanic membrane, ear canal and external ear normal.     Left Ear: Tympanic membrane, ear canal and external ear normal.     Nose: Nose normal.     Mouth/Throat:     Mouth: Mucous membranes are moist.  Eyes:     General: Lids are normal. Allergic shiner present.        Right eye: No foreign body, discharge or hordeolum.        Left eye: No foreign body, discharge or hordeolum.     Extraocular Movements: Extraocular movements intact.     Right eye: Normal extraocular motion and no nystagmus.     Left eye: Normal extraocular motion and no nystagmus.     Conjunctiva/sclera:     Right eye:  Right conjunctiva is injected.     Left eye: Left conjunctiva is injected.     Pupils: Pupils are equal, round, and reactive to light.  Cardiovascular:     Rate and Rhythm: Normal rate and regular rhythm.     Pulses: Normal pulses.     Heart sounds: Normal heart sounds.  Pulmonary:     Effort: Pulmonary effort is normal. No respiratory distress.     Breath sounds: Normal breath sounds. No stridor. No wheezing, rhonchi or rales.  Abdominal:     General: Bowel sounds are normal.     Palpations: Abdomen is soft.     Tenderness: There is no abdominal tenderness.  Musculoskeletal:     Cervical back: Normal range of motion.  Lymphadenopathy:     Cervical: No cervical adenopathy.  Skin:    General: Skin is warm and dry.  Neurological:     General: No focal deficit present.     Mental Status: She is alert and oriented to person, place, and time.  Psychiatric:        Mood and Affect: Mood normal.        Behavior: Behavior normal.      UC Treatments / Results  Labs (all labs ordered are listed, but only abnormal results are displayed) Labs Reviewed - No data to display  EKG   Radiology No results found.  Procedures Procedures (including critical care time)  Medications Ordered in UC Medications - No data to display  Initial Impression / Assessment and Plan / UC Course  I have reviewed the triage vital signs and the nursing notes.  Pertinent labs & imaging results that were available during my care of the patient were reviewed by me and considered in my medical decision making (see chart for details).  The patient is well-appearing, she is in no acute distress, vital signs are stable.  Suspect allergic conjunctivitis as patient states symptoms started at the same time in both eyes.  Will treat with Pataday 0.1% ophthalmic solution for symptoms.  Also recommended over-the-counter antihistamine such as Zyrtec, Claritin, or Allegra while symptoms persist.  Patient does have a  history of glaucoma, patient was advised that if she experiences visual changes or disturbances, she should stop the medication immediately.  Supportive care recommendations were provided and discussed with the patient to include cool compresses to the eyes, continuing her current medications, and to follow-up with her eye doctor if symptoms fail to improve.  Patient was in agreement with  this plan of care and verbalizes understanding.  All questions were answered.  Patient stable for discharge.   Final Clinical Impressions(s) / UC Diagnoses   Final diagnoses:  Allergic conjunctivitis of both eyes     Discharge Instructions      Apply eyedrops as prescribed. Recommend an over-the-counter antihistamine such as Zyrtec, Claritin, or Allegra while symptoms persist. Cool compresses to the eyes to help with pain, itching, swelling, and discomfort. Continue your current medications for glaucoma.  If you begin using the medication and you experience changes in your vision, please stop the medication immediately and follow-up with your eye doctor. If symptoms continue to persist, and you are not finding relief with the medication, please follow-up with your eye doctor for further evaluation. Follow-up as needed.     ED Prescriptions     Medication Sig Dispense Auth. Provider   olopatadine (PATADAY) 0.1 % ophthalmic solution Place 1 drop into both eyes 2 (two) times daily. 5 mL Fernandez Kenley-Warren, Sadie Haber, NP      PDMP not reviewed this encounter.   Abran Cantor, NP 10/07/22 1444

## 2022-10-10 ENCOUNTER — Telehealth: Payer: Self-pay | Admitting: "Endocrinology

## 2022-10-10 ENCOUNTER — Other Ambulatory Visit: Payer: Self-pay | Admitting: *Deleted

## 2022-10-10 ENCOUNTER — Other Ambulatory Visit: Payer: Self-pay | Admitting: Internal Medicine

## 2022-10-10 DIAGNOSIS — E559 Vitamin D deficiency, unspecified: Secondary | ICD-10-CM

## 2022-10-10 DIAGNOSIS — E782 Mixed hyperlipidemia: Secondary | ICD-10-CM

## 2022-10-10 DIAGNOSIS — R635 Abnormal weight gain: Secondary | ICD-10-CM

## 2022-10-10 DIAGNOSIS — H1045 Other chronic allergic conjunctivitis: Secondary | ICD-10-CM | POA: Diagnosis not present

## 2022-10-10 DIAGNOSIS — M7989 Other specified soft tissue disorders: Secondary | ICD-10-CM

## 2022-10-10 DIAGNOSIS — E890 Postprocedural hypothyroidism: Secondary | ICD-10-CM

## 2022-10-10 DIAGNOSIS — R7303 Prediabetes: Secondary | ICD-10-CM

## 2022-10-10 LAB — HM DIABETES EYE EXAM

## 2022-10-10 NOTE — Telephone Encounter (Signed)
Labs have been updated . 

## 2022-10-10 NOTE — Telephone Encounter (Signed)
Can someone update these labs?   

## 2022-10-11 ENCOUNTER — Ambulatory Visit (INDEPENDENT_AMBULATORY_CARE_PROVIDER_SITE_OTHER): Payer: Medicare Other | Admitting: Gastroenterology

## 2022-10-11 ENCOUNTER — Encounter: Payer: Self-pay | Admitting: Gastroenterology

## 2022-10-11 VITALS — BP 88/52 | HR 65 | Temp 97.6°F | Ht 66.0 in | Wt 222.6 lb

## 2022-10-11 DIAGNOSIS — R1011 Right upper quadrant pain: Secondary | ICD-10-CM | POA: Diagnosis not present

## 2022-10-11 NOTE — Progress Notes (Signed)
GI Office Note    Referring Provider: Anabel Halon, MD Primary Care Physician:  Anabel Halon, MD  Primary Gastroenterologist: Hennie Duos. Marletta Lor, DO   Chief Complaint   Chief Complaint  Patient presents with   Abdominal Pain    Still having right side pain    History of Present Illness   Kara [name] is a 77 y.o. female presenting today for ongoing abdominal pain. Last seen earlier this month by Dr. Marletta Lor. EUS with Dr. Meridee Score planned for 11/2022 for evaluation of prominent ampulla, elevated alk phos. MRI/MRCP was also advised by Dr. Meridee Score, but patient declined due to her concern for repeated imaging with planned CT chest/abd/pelvis for 11/2022 (ordered by her oncologist).  Some pain felt to be due to musculoskeletal as well.   She has had RUQ pain intermittently for years. Some days better than others but always has some degree of mild pain, sore to touch, burning quality. Other days can have severe debilitation pain lasting for hours, this pain typically brought on by meal. Pepcid helped a little. Gallbladder removed remotely and did not make pain better. She has significant postprandially component. Can have flare with certain foods (nuts, soda, greasy, spicy foods), and pain will go from a 5 to a 10 and last for hours, sometimes radiates into right shoulder/shoulder blade and associated with nausea. No vomiting. At other times, pain is there but not severe. Some of pain is reproduced with palpation over the ribs. She had been off pantoprazole for few weeks because she felt it dried out her stomach. Recently went back on it due to her pain. She has history of gastritis (negative H.pylori) on prior EGD, see below. She also takes ginger tea, tumeric and ginger gummies. Taking Pepto couple of times per week for her pain. Stools turned black. Notes she used to have BMs two per day but now can skip 2-3 days without a stool. No brbpr. Took Cipro for UTI recently and felt like  she had some relief in her RUQ pain. She wonders about use of additional antibiotics to take care of the pain. She notes more pain when she lays on her right side. Feels like something swollen. Intentional weight loss since 04/2022, 10 pounds by our records.   CT A/P with contrast 08/2022 showed mild pelvic lymphadenopathy bilateral iliac regions consider follow up CT vs PET/CT or tissue sampling. Followed up with her oncologist who recommended repeat CT in 3 months. Checked a UA which showed UTI, completed course of Cipro.     EGD 04/2022: -2cm hh -mild Schatzki ring -gastritis, reactive gastropathy, no H.pylori -congested duodenal mucosa, mild acute duodenitis   Colonoscopy 04/2022: -diverticulosis -five polyps removed, tubular adenomas -next colonoscopy in 3 years   Medications   Current Outpatient Medications  Medication Sig Dispense Refill   acetaminophen (TYLENOL) 500 MG tablet Take 500 mg by mouth every 8 (eight) hours as needed for moderate pain.     albuterol (VENTOLIN HFA) 108 (90 Base) MCG/ACT inhaler INHALE 1 TO 2 PUFFS INTO THE LUNGS EVERY 6 HOURS AS NEEDED FOR WHEEZING OR SHORTNESS OF BREATH 6.7 g 2   Ascorbic Acid (VITAMIN C WITH ROSE HIPS) 500 MG tablet Take 500 mg by mouth daily.     brimonidine (ALPHAGAN) 0.2 % ophthalmic solution 1 drop 2 (two) times daily.     Calcium Carb-Cholecalciferol (CALCIUM 600 + D PO) Take 1 tablet by mouth daily.     Cholecalciferol (VITAMIN D) 50  MCG (2000 UT) CAPS Take 2,000 Units by mouth daily.     furosemide (LASIX) 40 MG tablet TAKE 1 TABLET(40 MG) BY MOUTH DAILY AS NEEDED FOR SWELLING 30 tablet 2   latanoprost (XALATAN) 0.005 % ophthalmic solution SMARTSIG:1 Drop(s) In Eye(s) Every Evening     Misc Natural Products (SAMBUCUS ELDERBERRY VITAMIN C MT) Take 1 each by mouth daily. Gummy     olopatadine (PATADAY) 0.1 % ophthalmic solution Place 1 drop into both eyes 2 (two) times daily. 5 mL 0   pantoprazole (PROTONIX) 40 MG tablet TAKE 1  TABLET(40 MG) BY MOUTH DAILY BEFORE BREAKFAST 30 tablet 5   SYNTHROID 112 MCG tablet TAKE 1 TABLET(112 MCG) BY MOUTH DAILY BEFORE BREAKFAST 90 tablet 1   No current facility-administered medications for this visit.    Allergies   Allergies as of 10/11/2022 - Review Complete 10/11/2022  Allergen Reaction Noted   Coconut (cocos nucifera) Anaphylaxis 06/30/2020   Other Shortness Of Breath 01/20/2011   Prednisone Hypertension 12/03/2018   Petrolatum Dermatitis 04/28/2022   Naproxen Other (See Comments) 11/09/2015      Review of Systems   General: Negative for anorexia, unintentional weight loss, fever, chills, fatigue, weakness. ENT: Negative for hoarseness, difficulty swallowing , nasal congestion. CV: Negative for chest pain, angina, palpitations, dyspnea on exertion, +peripheral edema.  Respiratory: Negative for dyspnea at rest, dyspnea on exertion, cough, sputum, wheezing.  GI: See history of present illness. GU:  Negative for dysuria, hematuria, urinary incontinence, urinary frequency, nocturnal urination.  Endo: Negative for unusual weight change.     Physical Exam   BP (!) 82/53 (BP Location: Right Arm, Patient Position: Sitting, Cuff Size: Large)   Pulse 78   Temp 97.6 F (36.4 C) (Oral)   Ht 5\' 6"  (1.676 m)   Wt 222 lb 9.6 oz (101 kg)   SpO2 98%   BMI 35.93 kg/m    General: Well-nourished, well-developed in no acute distress.  Eyes: No icterus. Mouth: Oropharyngeal mucosa moist and pink   Abdomen: Bowel sounds are normal,  nondistended, no hepatosplenomegaly or masses,  no abdominal bruits or hernia , no rebound or guarding. Mild tenderness in the ruq and over the right lower anterior ribs.  Rectal: not performed Extremities: 2+  lower extremity edema. No clubbing or deformities. Neuro: Alert and oriented x 4   Skin: Warm and dry, no jaundice.   Psych: Alert and cooperative, normal mood and affect.  Labs   Lab Results  Component Value Date   CREATININE 0.91  08/17/2022   BUN 17 08/17/2022   NA 142 08/17/2022   K 4.4 08/17/2022   CL 106 08/17/2022   CO2 20 08/17/2022   Lab Results  Component Value Date   WBC 6.6 08/17/2022   HGB 14.2 08/17/2022   HCT 44.9 08/17/2022   MCV 81 08/17/2022   PLT 216 08/17/2022   Lab Results  Component Value Date   LIPASE 29 08/17/2022   Lab Results  Component Value Date   ALT 12 08/17/2022   AST 12 08/17/2022   GGT 74 (H) 08/17/2022   ALKPHOS 142 (H) 08/17/2022   BILITOT 0.4 08/17/2022   Lab Results  Component Value Date   GGT 74 (H) 08/17/2022    Imaging Studies   No results found.  Assessment   RUQ pain: chronic and intermittent. Significant postprandial component, improved with avoidance of trigger foods. Also with pain during palpation over the lower anterior right ribs, suspect musculoskeletal component as well. Work up  has shown gastritis/duodenitis but no H.pylori. Has had elevated alk phos, GGT, prominent ampulla on EGD with plans for EUS as outlined. Cannot exclude biliary component to her pain.She has been offered MRI/MRCP but declined for now. Plans for CT chest/abd/pelvis July per oncology.    PLAN   With next flare up of pain, obtain labs approximately 6 hours after onset of pain.  Await CTs in July. Await EUS. We discussed possibly additional antibiotics since she appreciated improvement in pain (ie cover for SIBO) vs antispasmotic such as dicyclomine but she prefers waiting for pending studies for now.  Continue pantoprazole daily. Hold off on Pepto due to infrequent stools.    Leanna Battles. Melvyn Neth, MHS, PA-C [facility]

## 2022-10-11 NOTE — Patient Instructions (Signed)
If you have another back flare up of right upper abdominal pain, please go to the [facility] at [facility], across from the nursing home, approximately 6 hours after onset of pain. If lab is closed, go the first thing the next morning that it is open.  Await repeat CTs and endoscopic ultrasound in July as scheduled.  Continue pantoprazole daily.  Hold off on Pepto to see if your stools become more frequent.  Please reach out with any questions or concerns.

## 2022-10-13 DIAGNOSIS — E559 Vitamin D deficiency, unspecified: Secondary | ICD-10-CM | POA: Diagnosis not present

## 2022-10-13 DIAGNOSIS — E782 Mixed hyperlipidemia: Secondary | ICD-10-CM | POA: Diagnosis not present

## 2022-10-13 DIAGNOSIS — E890 Postprocedural hypothyroidism: Secondary | ICD-10-CM | POA: Diagnosis not present

## 2022-10-13 DIAGNOSIS — R7303 Prediabetes: Secondary | ICD-10-CM | POA: Diagnosis not present

## 2022-10-13 DIAGNOSIS — R635 Abnormal weight gain: Secondary | ICD-10-CM | POA: Diagnosis not present

## 2022-10-14 LAB — COMPREHENSIVE METABOLIC PANEL
ALT: 18 IU/L (ref 0–32)
AST: 18 IU/L (ref 0–40)
Albumin/Globulin Ratio: 1.3 (ref 1.2–2.2)
Albumin: 3.7 g/dL — ABNORMAL LOW (ref 3.8–4.8)
Alkaline Phosphatase: 140 IU/L — ABNORMAL HIGH (ref 44–121)
BUN/Creatinine Ratio: 21 (ref 12–28)
BUN: 18 mg/dL (ref 8–27)
Bilirubin Total: 0.4 mg/dL (ref 0.0–1.2)
CO2: 20 mmol/L (ref 20–29)
Calcium: 9.4 mg/dL (ref 8.7–10.3)
Chloride: 106 mmol/L (ref 96–106)
Creatinine, Ser: 0.84 mg/dL (ref 0.57–1.00)
Globulin, Total: 2.8 g/dL (ref 1.5–4.5)
Glucose: 122 mg/dL — ABNORMAL HIGH (ref 70–99)
Potassium: 4.4 mmol/L (ref 3.5–5.2)
Sodium: 138 mmol/L (ref 134–144)
Total Protein: 6.5 g/dL (ref 6.0–8.5)
eGFR: 72 mL/min/{1.73_m2} (ref >59)

## 2022-10-14 LAB — VITAMIN D 25 HYDROXY (VIT D DEFICIENCY, FRACTURES): Vit D, 25-Hydroxy: 50.6 ng/mL (ref 30.0–100.0)

## 2022-10-14 LAB — LIPID PANEL
Chol/HDL Ratio: 3.9 ratio (ref 0.0–4.4)
Cholesterol, Total: 191 mg/dL (ref 100–199)
HDL: 49 mg/dL (ref >39)
LDL Chol Calc (NIH): 129 mg/dL — ABNORMAL HIGH (ref 0–99)
Triglycerides: 68 mg/dL (ref 0–149)
VLDL Cholesterol Cal: 13 mg/dL (ref 5–40)

## 2022-10-14 LAB — T4, FREE: Free T4: 1.91 ng/dL — ABNORMAL HIGH (ref 0.82–1.77)

## 2022-10-14 LAB — TSH: TSH: 0.288 u[IU]/mL — ABNORMAL LOW (ref 0.450–4.500)

## 2022-10-19 ENCOUNTER — Encounter: Payer: Self-pay | Admitting: Hematology and Oncology

## 2022-10-19 ENCOUNTER — Encounter: Payer: Self-pay | Admitting: "Endocrinology

## 2022-10-19 ENCOUNTER — Encounter: Payer: Self-pay | Admitting: Internal Medicine

## 2022-10-19 ENCOUNTER — Ambulatory Visit (INDEPENDENT_AMBULATORY_CARE_PROVIDER_SITE_OTHER): Payer: Medicare Other | Admitting: "Endocrinology

## 2022-10-19 ENCOUNTER — Other Ambulatory Visit: Payer: Self-pay | Admitting: Internal Medicine

## 2022-10-19 VITALS — BP 120/66 | HR 68 | Ht 66.0 in | Wt 222.8 lb

## 2022-10-19 DIAGNOSIS — R7303 Prediabetes: Secondary | ICD-10-CM

## 2022-10-19 DIAGNOSIS — E782 Mixed hyperlipidemia: Secondary | ICD-10-CM | POA: Diagnosis not present

## 2022-10-19 DIAGNOSIS — Z6835 Body mass index (BMI) 35.0-35.9, adult: Secondary | ICD-10-CM

## 2022-10-19 DIAGNOSIS — M7989 Other specified soft tissue disorders: Secondary | ICD-10-CM

## 2022-10-19 DIAGNOSIS — E890 Postprocedural hypothyroidism: Secondary | ICD-10-CM | POA: Diagnosis not present

## 2022-10-19 LAB — POCT GLYCOSYLATED HEMOGLOBIN (HGB A1C): HbA1c, POC (controlled diabetic range): 6.1 % (ref 0.0–7.0)

## 2022-10-19 MED ORDER — SYNTHROID 100 MCG PO TABS
100.0000 ug | ORAL_TABLET | Freq: Every day | ORAL | 1 refills | Status: DC
Start: 2022-10-19 — End: 2023-01-18

## 2022-10-19 MED ORDER — HYDROCHLOROTHIAZIDE 12.5 MG PO TABS
12.5000 mg | ORAL_TABLET | Freq: Every day | ORAL | 3 refills | Status: AC
Start: 2022-10-19 — End: ?

## 2022-10-19 NOTE — Patient Instructions (Signed)

## 2022-10-19 NOTE — Progress Notes (Signed)
10/19/2022, 10:46 AM                         Endocrinology follow-up note   Kara [name] is a 77 y.o.-year-old female patient being seen in follow-up for management of  RAI induced hypothyroidism referred by Anabel Halon, MD.   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  The STAT Breast cancer panel offered by [facility] includes sequencing and rearrangement analysis for the following 9 genes:  ATM, BRCA1, BRCA2, CDH1, CHEK2, PALB2, PTEN, STK11 and TP53.   The report date is August 09, 2019.   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 fibroids   BIOPSY  05/02/2022   Procedure: BIOPSY;  Surgeon: Lanelle Bal, DO;  [facility];  Service: Endoscopy;;   BREAST BIOPSY Left 05/2019   left breast DCIS   BREAST LUMPECTOMY Left 06/2019   DCIS   BREAST LUMPECTOMY WITH RADIOACTIVE SEED LOCALIZATION Left 07/08/2019   Procedure: LEFT BREAST LUMPECTOMY WITH RADIOACTIVE SEED LOCALIZATION;  Surgeon: Griselda Miner, MD;  Location: [facility];  Service: General;  Laterality: Left;   BREAST SURGERY N/A    Phreesia 08/30/2020   CESAREAN SECTION     CESAREAN SECTION N/A    Phreesia 08/30/2020   CHOLECYSTECTOMY  COLONOSCOPY  2011   [place]: two 3-4 mm sessile polyps, hyperplastic, sigmoid diverticula, TI appeared normal.    COLONOSCOPY WITH PROPOFOL N/A 05/02/2022   Procedure: COLONOSCOPY WITH PROPOFOL;  Surgeon: Lanelle Bal, DO;  [facility]: [facility];  Service: Endoscopy;  Laterality: N/A;  11:30 am   ESOPHAGOGASTRODUODENOSCOPY  2011   [place]: non-bleeding erosive gastropathy, normal duodenum. path: unremarkable duodenum, negative H.pylori, minimal esophagitis    ESOPHAGOGASTRODUODENOSCOPY (EGD) WITH PROPOFOL N/A 05/02/2022   Procedure: ESOPHAGOGASTRODUODENOSCOPY (EGD) WITH PROPOFOL;  Surgeon: Lanelle Bal, DO;  [facility];  Service: Endoscopy;  Laterality: N/A;   POLYPECTOMY  05/02/2022   Procedure: POLYPECTOMY;  Surgeon: Lanelle Bal, DO;  [facility];  Service: Endoscopy;;   TONSILLECTOMY  1975    Social History   Socioeconomic History   Marital status: Divorced    Spouse name: Not on file   Number of children: 2   Years of education: 15   Highest education level: Not on file  Occupational History   Occupation: retired    Comment: Loss adjuster, chartered at a Database administrator  Tobacco Use   Smoking status: Former    Types: Cigarettes    Quit date: 06/15/1998     Years since quitting: 24.3   Smokeless tobacco: Never   Tobacco comments:    quit 2002  Vaping Use   Vaping Use: Never used  Substance and Sexual Activity   Alcohol use: No   Drug use: No   Sexual activity: Not Currently    Birth control/protection: Surgical    Comment: hyst  Other Topics Concern   Not on file  Social History Narrative   Lives alone   2 grown children- one in [place] and one here   Grandchildren       Moved to [place] from [place] for aging mother who is 43 with Alzheimers      Maternal Aunt is Animator of the [employer] cancer call line      Enjoy: gym, time with friends       Diet: eats all food groups   Caffeine: 2 diet cokes daily    Water: 2-4 cups daily       Wears seat belt    Does not use phone while driving    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   [employer]    No weapons       Right Handed    Lives alone in a one story home    Retired   International aid/development worker of Health   Financial Resource Strain: Low Risk  (10/06/2020)   Overall Financial Resource Strain (CARDIA)    Difficulty of Paying Living Expenses: Not hard at all  Food Insecurity: No Food Insecurity (10/06/2020)   Hunger Vital Sign    Worried About Running Out of Food in the Last Year: Never true    Ran Out of Food in the Last Year: Never true  Transportation Needs: No Transportation Needs (10/06/2020)   PRAPARE - Administrator, Civil Service (Medical): No    Lack of Transportation (Non-Medical): No  Physical Activity: Sufficiently Active (10/06/2020)   Exercise Vital Sign    Days of Exercise per Week: 7 days    Minutes of Exercise per Session: 30 min  Stress: No Stress Concern Present (10/06/2020)   [employer] of Occupational Health - Occupational Stress Questionnaire    Feeling of Stress : Not at all  Social Connections:  Moderately Isolated (10/06/2020)   Social Connection and Isolation Panel [NHANES]    Frequency of Communication with  Friends and Family: More than three times a week    Frequency of Social Gatherings with Friends and Family: More than three times a week    Attends Religious Services: Never    Database administrator or Organizations: Yes    Attends Engineer, structural: More than 4 times per year    Marital Status: Divorced    Family History  Problem Relation Age of Onset   Breast cancer Maternal Grandmother        dx over 32   Prostate cancer Maternal Grandfather    Arthritis Mother    COPD Mother    Breast cancer Mother 77       second at age 61   Cancer Father        throat   Stroke Father    Cancer Maternal Aunt 20       [name], cervical cancer   Breast cancer Maternal Aunt        dx over 7   Breast cancer Paternal Aunt        dx under 50   Prostate cancer Maternal Uncle        dx over 2   Cancer Other        father's maternal cousin was Henreietta Lacks, Cervical   Cancer Maternal Uncle        unknown cancer   Breast cancer Paternal Aunt        dx over 37   Colon cancer Neg Hx    Allergic rhinitis Neg Hx    Angioedema Neg Hx    Asthma Neg Hx    Atopy Neg Hx    Eczema Neg Hx    Urticaria Neg Hx    Immunodeficiency Neg Hx     Outpatient Encounter Medications as of 10/19/2022  Medication Sig   SYNTHROID 100 MCG tablet Take 1 tablet (100 mcg total) by mouth daily before breakfast.   acetaminophen (TYLENOL) 500 MG tablet Take 500 mg by mouth every 8 (eight) hours as needed for moderate pain.   albuterol (VENTOLIN HFA) 108 (90 Base) MCG/ACT inhaler INHALE 1 TO 2 PUFFS INTO THE LUNGS EVERY 6 HOURS AS NEEDED FOR WHEEZING OR SHORTNESS OF BREATH   Ascorbic Acid (VITAMIN C WITH ROSE HIPS) 500 MG tablet Take 500 mg by mouth daily.   brimonidine (ALPHAGAN) 0.2 % ophthalmic solution 1 drop 2 (two) times daily.   Calcium Carb-Cholecalciferol (CALCIUM 600 + D PO) Take 1 tablet by mouth daily.   Cholecalciferol (VITAMIN D) 50 MCG (2000 UT) CAPS Take 2,000 Units by mouth  daily.   latanoprost (XALATAN) 0.005 % ophthalmic solution SMARTSIG:1 Drop(s) In Eye(s) Every Evening   Misc Natural Products (SAMBUCUS ELDERBERRY VITAMIN C MT) Take 1 each by mouth daily. Gummy   olopatadine (PATADAY) 0.1 % ophthalmic solution Place 1 drop into both eyes 2 (two) times daily.   pantoprazole (PROTONIX) 40 MG tablet TAKE 1 TABLET(40 MG) BY MOUTH DAILY BEFORE BREAKFAST   [DISCONTINUED] furosemide (LASIX) 40 MG tablet TAKE 1 TABLET(40 MG) BY MOUTH DAILY AS NEEDED FOR SWELLING   [DISCONTINUED] SYNTHROID 112 MCG tablet TAKE 1 TABLET(112 MCG) BY MOUTH DAILY BEFORE BREAKFAST   No facility-administered encounter medications on file as of 10/19/2022.    ALLERGIES: Allergies  Allergen Reactions   Coconut (Cocos Nucifera) Anaphylaxis   Other Shortness Of Breath    Covid booster  pfizer     Prednisone Hypertension    Severe headache   Petrolatum Dermatitis    Caused incision irritation    Naproxen Other (See Comments)    headache   VACCINATION STATUS: Immunization History  Administered Date(s) Administered   Fluad Quad(high Dose 65+) 02/11/2020, 01/20/2021, 01/25/2022   Influenza, High Dose Seasonal PF 02/08/2018, 02/17/2019   Influenza,inj,Quad PF,6+ Mos 02/28/2017   Influenza-Unspecified 02/28/2017   Moderna Covid-19 Vaccine Bivalent Booster 63yrs & up 02/03/2022   PFIZER(Purple Top)SARS-COV-2 Vaccination 06/06/2019, 06/24/2019, 04/21/2020, 10/07/2020, 02/22/2021   Pneumococcal Conjugate-13 07/05/2016   Pneumococcal Polysaccharide-23 04/29/2019   Td 12/23/2018   Zoster Recombinat (Shingrix) 06/06/2021     HPI    AMARISS HOLMSTROM  is a patient with the above medical history. she was diagnosed  with hyperthyroidism at approximate age of 33 years  which required I-131 thyroid ablation in environment.   -She was subsequently initiated on thyroid hormone supplement.  She is currently on Synthroid 112 mcg p.o. daily before breakfast. She reports compliance and consistency  with her medication.  Her previous thyroid function test In recent over replacement.   She has prediabetes not on medication.  She has dyslipidemia, does not want to go on statins.  She has not changed her diet yet.  -She presents with steady weight since last visit.  She denies tremors, palpitations. Pt denies feeling nodules in neck, hoarseness, dysphagia/odynophagia, SOB with lying down.  she denies family history of  thyroid disorders.  No family history of thyroid cancer.  Her father had head and neck tumor which did not appear to be related to thyroid cancer.  ROS:  Limited as above.   Physical Exam: BP 120/66   Pulse 68   Ht 5\' 6"  (1.676 m)   Wt 222 lb 12.8 oz (101.1 kg)   BMI 35.96 kg/m  Wt Readings from Last 3 Encounters:  10/19/22 222 lb 12.8 oz (101.1 kg)  10/11/22 222 lb 9.6 oz (101 kg)  09/13/22 224 lb 11.2 oz (101.9 kg)    CMP     Component Value Date/Time   NA 138 10/13/2022 0845   K 4.4 10/13/2022 0845   CL 106 10/13/2022 0845   CO2 20 10/13/2022 0845   GLUCOSE 122 (H) 10/13/2022 0845   GLUCOSE 112 (H) 04/25/2022 1500   BUN 18 10/13/2022 0845   CREATININE 0.84 10/13/2022 0845   CREATININE 0.75 03/08/2020 0935   CALCIUM 9.4 10/13/2022 0845   PROT 6.5 10/13/2022 0845   ALBUMIN 3.7 (L) 10/13/2022 0845   AST 18 10/13/2022 0845   ALT 18 10/13/2022 0845   ALKPHOS 140 (H) 10/13/2022 0845   BILITOT 0.4 10/13/2022 0845   GFRNONAA 52 (L) 04/25/2022 1500   GFRNONAA 70 10/22/2019 0958   GFRAA 82 10/22/2019 0958    Diabetic Labs (most recent): Lab Results  Component Value Date   HGBA1C 6.1 10/19/2022   HGBA1C 6.3 04/19/2022   HGBA1C 6.2 01/13/2022   MICROALBUR 0.5 08/16/2017   MICROALBUR 0.3 02/19/2017     Lipid Panel ( most recent) Lipid Panel     Component Value Date/Time   CHOL 191 10/13/2022 0845   TRIG 68 10/13/2022 0845   HDL 49 10/13/2022 0845   CHOLHDL 3.9 10/13/2022 0845   CHOLHDL 3.0 10/22/2019 0958   VLDL 15 11/24/2016 1018    LDLCALC 129 (H) 10/13/2022 0845   LDLCALC 114 (H) 10/22/2019 0958   LABVLDL 13 10/13/2022 0845     Lab Results  Component Value  Date   TSH 0.288 (L) 10/13/2022   TSH 4.420 04/13/2022   TSH 2.720 01/09/2022   TSH 0.980 07/07/2021   TSH 0.62 03/02/2021   TSH 3.340 10/25/2020   TSH 1.48 03/08/2020   TSH 0.78 09/08/2019   TSH 0.34 (L) 02/27/2019   TSH 0.52 01/15/2019   FREET4 1.91 (H) 10/13/2022   FREET4 1.32 04/13/2022   FREET4 1.35 01/09/2022   FREET4 1.52 07/07/2021   FREET4 1.5 03/02/2021   FREET4 1.56 10/25/2020   FREET4 1.4 03/08/2020   FREET4 1.3 09/08/2019   FREET4 1.5 02/27/2019       ASSESSMENT: 1. Hypothyroidism- RAI induced 2.  Prediabetes 3. Hyperlipidemia 4. Vitamin d deficiency   PLAN:   Her previsit thyroid function tests are consistent with over replacement.  I discussed and lowered her Synthroid to 100 mcg p.o. daily before breakfast.     - We discussed about the correct intake of her thyroid hormone, on empty stomach at fasting, with water, separated by at least 30 minutes from breakfast and other medications,  and separated by more than 4 hours from calcium, iron, multivitamins, acid reflux medications (PPIs). -Patient is made aware of the fact that thyroid hormone replacement is needed for life, dose to be adjusted by periodic monitoring of thyroid function tests.     -Due to absence of clinical goiter, no need for thyroid ultrasound. In light of her metabolic dysfunction indicated by prediabetes, with point-of-care A1c of 6.1% today, severe hyperlipidemia, and the fact that she was to avoid statin medications, obesity, she remains a good candidate for lifestyle medicine.  She has not engaged optimally in prior attempt, however she wishes to try again.    - she acknowledges that there is a room for improvement in her food and drink choices. - Suggestion is made for her to avoid simple carbohydrates  from her diet including Cakes, Sweet Desserts, Ice  Cream, Soda (diet and regular), Sweet Tea, Candies, Chips, Cookies, Store Bought Juices, Alcohol , Artificial Sweeteners,  Coffee Creamer, and "Sugar-free" Products, Lemonade. This will help patient to have more stable blood glucose profile and potentially avoid unintended weight gain.  The following Lifestyle Medicine recommendations according to American College of Lifestyle Medicine  [facility]) were discussed and and offered to patient and she  agrees to start the journey:  A. Whole Foods, Plant-Based Nutrition comprising of fruits and vegetables, plant-based proteins, whole-grain carbohydrates was discussed in detail with the patient.   A list for source of those nutrients were also provided to the patient.  Patient will use only water or unsweetened tea for hydration. B.  The need to stay away from risky substances including alcohol, smoking; obtaining 7 to 9 hours of restorative sleep, at least 150 minutes of moderate intensity exercise weekly, the importance of healthy social connections,  and stress management techniques were discussed. C.  A full color page of  Calorie density of various food groups per pound showing examples of each food groups was provided to the patient.    This approach will help her with weight management, prediabetes, HPL, HTN.  She is advised to maintain close follow-up with her PCP.     I spent  26  minutes in the care of the patient today including review of labs from Thyroid Function, CMP, and other relevant labs ; imaging/biopsy records (current and previous including abstractions from other facilities); face-to-face time discussing  her lab results and symptoms, medications doses, her options of short and long  term treatment based on the latest standards of care / guidelines;   and documenting the encounter.  Joaquin Music  participated in the discussions, expressed understanding, and voiced agreement with the above plans.  All questions were answered to her  satisfaction. she is encouraged to contact clinic should she have any questions or concerns prior to her return visit.    Return in about 3 months (around 01/19/2023) for Fasting Labs  in AM B4 8, A1c -NV.  Marquis Lunch, MD [facility] [address] [place], [place] [address] Phone: [phone]  Fax: [phone]   10/19/2022, 10:46 AM  This note was partially dictated with voice recognition software. Similar sounding words can be transcribed inadequately or may not  be corrected upon review.

## 2022-10-20 ENCOUNTER — Telehealth: Payer: Self-pay

## 2022-10-20 NOTE — Telephone Encounter (Signed)
Called pt per [employer]. She has been scheduled for CT CAP with no contrast per MD 12/04/22 at 0945. She will come in 12/07/22 at 1145 for MD to review results. She knows to be NPO 4 hr prior to CT.

## 2022-10-25 DIAGNOSIS — H401221 Low-tension glaucoma, left eye, mild stage: Secondary | ICD-10-CM | POA: Diagnosis not present

## 2022-10-25 DIAGNOSIS — H4720 Unspecified optic atrophy: Secondary | ICD-10-CM | POA: Diagnosis not present

## 2022-10-25 DIAGNOSIS — H04123 Dry eye syndrome of bilateral lacrimal glands: Secondary | ICD-10-CM | POA: Diagnosis not present

## 2022-10-25 DIAGNOSIS — H40021 Open angle with borderline findings, high risk, right eye: Secondary | ICD-10-CM | POA: Diagnosis not present

## 2022-10-26 ENCOUNTER — Other Ambulatory Visit: Payer: Self-pay

## 2022-10-26 ENCOUNTER — Encounter: Payer: Self-pay | Admitting: Internal Medicine

## 2022-10-26 DIAGNOSIS — G4730 Sleep apnea, unspecified: Secondary | ICD-10-CM

## 2022-11-06 ENCOUNTER — Encounter: Payer: Medicare Other | Admitting: Internal Medicine

## 2022-11-09 ENCOUNTER — Other Ambulatory Visit: Payer: Self-pay

## 2022-11-09 ENCOUNTER — Encounter (HOSPITAL_COMMUNITY): Payer: Self-pay | Admitting: Gastroenterology

## 2022-11-09 NOTE — Progress Notes (Addendum)
COVID Vaccine Completed: yes  Date of COVID positive in last 90 days: no  PCP - Trena Platt, MD Cardiologist - Thurmon Fair, MD 12/14/21 for palpitations   Chest x-ray - n/a EKG - 12/14/21 Epic Stress Test - more than 5 years per pt ECHO - 05/04/20 Epic Cardiac Cath - n/a Pacemaker/ICD device last checked: n/a Spinal Cord Stimulator: n/a  Bowel Prep - NPO after midnight  Sleep Study - yes CPAP - no, waiting on machine  Fasting Blood Sugar - preDM Checks Blood Sugar no checks at home per pt  Last dose of GLP1 agonist-  N/A GLP1 instructions:  N/A   Last dose of SGLT-2 inhibitors-  N/A SGLT-2 instructions: N/A   Blood Thinner Instructions: n/a Aspirin Instructions: Last Dose:  Activity level: Can go up a flight of stairs and perform activities of daily living without stopping and without symptoms of chest pain or shortness of breath.   Anesthesia review: palpitations  Patient denies shortness of breath, fever, cough and chest pain  Patient verbalized understanding of instructions that were given to them

## 2022-11-17 ENCOUNTER — Other Ambulatory Visit: Payer: Self-pay

## 2022-11-17 DIAGNOSIS — R7989 Other specified abnormal findings of blood chemistry: Secondary | ICD-10-CM

## 2022-11-22 ENCOUNTER — Telehealth: Payer: Self-pay

## 2022-11-22 ENCOUNTER — Other Ambulatory Visit: Payer: Self-pay | Admitting: Internal Medicine

## 2022-11-22 ENCOUNTER — Ambulatory Visit (INDEPENDENT_AMBULATORY_CARE_PROVIDER_SITE_OTHER): Payer: Medicare Other

## 2022-11-22 VITALS — Ht 66.0 in | Wt 216.0 lb

## 2022-11-22 DIAGNOSIS — Z0000 Encounter for general adult medical examination without abnormal findings: Secondary | ICD-10-CM | POA: Diagnosis not present

## 2022-11-22 DIAGNOSIS — R7303 Prediabetes: Secondary | ICD-10-CM

## 2022-11-22 DIAGNOSIS — H401221 Low-tension glaucoma, left eye, mild stage: Secondary | ICD-10-CM | POA: Diagnosis not present

## 2022-11-22 LAB — HM DIABETES EYE EXAM

## 2022-11-22 NOTE — Telephone Encounter (Signed)
Message sent

## 2022-11-22 NOTE — Telephone Encounter (Signed)
 patient would like a referral to an exercise and weight management program at the [facility]. She stated that it is a 6 week program but requires a referral from her PCP.   PATIENT PREFERS A RESPONSE BE SENT TO HER THROUGH MYCHART.  THANK YOU   Pink Maye, CMA  [facility] AW[facility] Team Direct Dial: [phone]

## 2022-11-22 NOTE — Anesthesia Preprocedure Evaluation (Signed)
Anesthesia Evaluation  Patient identified by MRN, date of birth, ID band Patient awake    Reviewed: Allergy & Precautions, NPO status , Patient's Chart, lab work & pertinent test results  Airway Mallampati: II  TM Distance: >3 FB Neck ROM: Full    Dental no notable dental hx. (+) Teeth Intact, Dental Advisory Given   Pulmonary former smoker   Pulmonary exam normal breath sounds clear to auscultation       Cardiovascular negative cardio ROS Normal cardiovascular exam Rhythm:Regular Rate:Normal     Neuro/Psych  Headaches  Neuromuscular disease    GI/Hepatic ,GERD  ,,  Endo/Other  Hypothyroidism    Renal/GU      Musculoskeletal   Abdominal   Peds  Hematology   Anesthesia Other Findings All: prednisone, naloxone  Breast CA  Reproductive/Obstetrics                             Anesthesia Physical Anesthesia Plan  ASA: 3  Anesthesia Plan: MAC   Post-op Pain Management: Minimal or no pain anticipated   Induction:   PONV Risk Score and Plan: Treatment may vary due to age or medical condition and Propofol infusion  Airway Management Planned: Natural Airway and Nasal Cannula  Additional Equipment: None  Intra-op Plan:   Post-operative Plan:   Informed Consent: I have reviewed the patients History and Physical, chart, labs and discussed the procedure including the risks, benefits and alternatives for the proposed anesthesia with the patient or authorized representative who has indicated his/her understanding and acceptance.     Dental advisory given  Plan Discussed with: CRNA  Anesthesia Plan Comments: (Abd pain elevated LFT)        Anesthesia Quick Evaluation

## 2022-11-22 NOTE — Patient Instructions (Signed)
Kara [name] , Thank you for taking time to come for your Medicare Wellness Visit. I appreciate your ongoing commitment to your health goals. Please review the following plan we discussed and let me know if I can assist you in the future.   These are the goals we discussed:  Goals       Exercise 150 minutes per week (moderate activity)      HEMOGLOBIN A1C < 7.0      LDL CALC < 100      Patient Stated      Patient states that she currently does not have any goals set       Weight (lb) < 20 lb (9.1 kg) (pt-stated)      Weight (lb) < 200 lb (90.7 kg)        This is a list of the screening recommended for you and due dates:  Health Maintenance  Topic Date Due   Yearly kidney health urinalysis for diabetes  08/17/2018   Zoster (Shingles) Vaccine (2 of 2) 08/01/2021   COVID-19 Vaccine (7 - 2023-24 season) 03/31/2022   Flu Shot  12/14/2022   Complete foot exam   04/13/2023   Hemoglobin A1C  04/20/2023   Eye exam for diabetics  10/10/2023   Yearly kidney function blood test for diabetes  10/13/2023   Medicare Annual Wellness Visit  11/22/2023   DTaP/Tdap/Td vaccine (2 - Tdap) 12/22/2028   Colon Cancer Screening  05/02/2032   Pneumonia Vaccine  Completed   DEXA scan (bone density measurement)  Completed   Hepatitis C Screening  Completed   HPV Vaccine  Aged Out    Advanced directives:  Advance directive discussed with you today. Even though you declined this today, please call our office should you change your mind, and we can give you the proper paperwork for you to fill out. Advance care planning is a way to make decisions about medical care that fits your values in case you are ever unable to make these decisions for yourself.  Information on Advanced Care Planning can be found at [facility] Advance Health Care Directives Advance Health Care Directives ([email or web address]    Conditions/risks identified:  Aim for 30 minutes of exercise or brisk walking, 6-8 glasses of  water, and 5 servings of fruits and vegetables each day.   Next appointment: VIRTUAL/TELEPHONE APPOINTMENT Follow up in one year for your annual wellness visit  January 02, 2024 at 11:00am telephone visit   Preventive Care 65 Years and Older, Female Preventive care refers to lifestyle choices and visits with your health care provider that can promote health and wellness. What does preventive care include? A yearly physical exam. This is also called an annual well check. Dental exams once or twice a year. Routine eye exams. Ask your health care provider how often you should have your eyes checked. Personal lifestyle choices, including: Daily care of your teeth and gums. Regular physical activity. Eating a healthy diet. Avoiding tobacco and drug use. Limiting alcohol use. Practicing safe sex. Taking low-dose aspirin every day. Taking vitamin and mineral supplements as recommended by your health care provider. What happens during an annual well check? The services and screenings done by your health care provider during your annual well check will depend on your age, overall health, lifestyle risk factors, and family history of disease. Counseling  Your health care provider may ask you questions about your: Alcohol use. Tobacco use. Drug use. Emotional well-being. Home and relationship well-being. Sexual  activity. Eating habits. History of falls. Memory and ability to understand (cognition). Work and work Astronomer. Reproductive health. Screening  You may have the following tests or measurements: Height, weight, and BMI. Blood pressure. Lipid and cholesterol levels. These may be checked every 5 years, or more frequently if you are over 59 years old. Skin check. Lung cancer screening. You may have this screening every year starting at age 26 if you have a 30-pack-year history of smoking and currently smoke or have quit within the past 15 years. Fecal occult blood test (FOBT)  of the stool. You may have this test every year starting at age 11. Flexible sigmoidoscopy or colonoscopy. You may have a sigmoidoscopy every 5 years or a colonoscopy every 10 years starting at age 41. Hepatitis C blood test. Hepatitis B blood test. Sexually transmitted disease (STD) testing. Diabetes screening. This is done by checking your blood sugar (glucose) after you have not eaten for a while (fasting). You may have this done every 1-3 years. Bone density scan. This is done to screen for osteoporosis. You may have this done starting at age 74. Mammogram. This may be done every 1-2 years. Talk to your health care provider about how often you should have regular mammograms. Talk with your health care provider about your test results, treatment options, and if necessary, the need for more tests. Vaccines  Your health care provider may recommend certain vaccines, such as: Influenza vaccine. This is recommended every year. Tetanus, diphtheria, and acellular pertussis (Tdap, Td) vaccine. You may need a Td booster every 10 years. Zoster vaccine. You may need this after age 58. Pneumococcal 13-valent conjugate (PCV13) vaccine. One dose is recommended after age 5. Pneumococcal polysaccharide (PPSV23) vaccine. One dose is recommended after age 100. Talk to your health care provider about which screenings and vaccines you need and how often you need them. This information is not intended to replace advice given to you by your health care provider. Make sure you discuss any questions you have with your health care provider. Document Released: 05/28/2015 Document Revised: 01/19/2016 Document Reviewed: 03/02/2015 Elsevier Interactive Patient Education  2017 [employer].  Fall Prevention in the Home Falls can cause injuries. They can happen to people of all ages. There are many things you can do to make your home safe and to help prevent falls. What can I do on the outside of my home? Regularly fix  the edges of walkways and driveways and fix any cracks. Remove anything that might make you trip as you walk through a door, such as a raised step or threshold. Trim any bushes or trees on the path to your home. Use bright outdoor lighting. Clear any walking paths of anything that might make someone trip, such as rocks or tools. Regularly check to see if handrails are loose or broken. Make sure that both sides of any steps have handrails. Any raised decks and porches should have guardrails on the edges. Have any leaves, snow, or ice cleared regularly. Use sand or salt on walking paths during winter. Clean up any spills in your garage right away. This includes oil or grease spills. What can I do in the bathroom? Use night lights. Install grab bars by the toilet and in the tub and shower. Do not use towel bars as grab bars. Use non-skid mats or decals in the tub or shower. If you need to sit down in the shower, use a plastic, non-slip stool. Keep the floor dry. Clean up any  water that spills on the floor as soon as it happens. Remove soap buildup in the tub or shower regularly. Attach bath mats securely with double-sided non-slip rug tape. Do not have throw rugs and other things on the floor that can make you trip. What can I do in the bedroom? Use night lights. Make sure that you have a light by your bed that is easy to reach. Do not use any sheets or blankets that are too big for your bed. They should not hang down onto the floor. Have a firm chair that has side arms. You can use this for support while you get dressed. Do not have throw rugs and other things on the floor that can make you trip. What can I do in the kitchen? Clean up any spills right away. Avoid walking on wet floors. Keep items that you use a lot in easy-to-reach places. If you need to reach something above you, use a strong step stool that has a grab bar. Keep electrical cords out of the way. Do not use floor polish  or wax that makes floors slippery. If you must use wax, use non-skid floor wax. Do not have throw rugs and other things on the floor that can make you trip. What can I do with my stairs? Do not leave any items on the stairs. Make sure that there are handrails on both sides of the stairs and use them. Fix handrails that are broken or loose. Make sure that handrails are as long as the stairways. Check any carpeting to make sure that it is firmly attached to the stairs. Fix any carpet that is loose or worn. Avoid having throw rugs at the top or bottom of the stairs. If you do have throw rugs, attach them to the floor with carpet tape. Make sure that you have a light switch at the top of the stairs and the bottom of the stairs. If you do not have them, ask someone to add them for you. What else can I do to help prevent falls? Wear shoes that: Do not have high heels. Have rubber bottoms. Are comfortable and fit you well. Are closed at the toe. Do not wear sandals. If you use a stepladder: Make sure that it is fully opened. Do not climb a closed stepladder. Make sure that both sides of the stepladder are locked into place. Ask someone to hold it for you, if possible. Clearly mark and make sure that you can see: Any grab bars or handrails. First and last steps. Where the edge of each step is. Use tools that help you move around (mobility aids) if they are needed. These include: Canes. Walkers. Scooters. Crutches. Turn on the lights when you go into a dark area. Replace any light bulbs as soon as they burn out. Set up your furniture so you have a clear path. Avoid moving your furniture around. If any of your floors are uneven, fix them. If there are any pets around you, be aware of where they are. Review your medicines with your doctor. Some medicines can make you feel dizzy. This can increase your chance of falling. Ask your doctor what other things that you can do to help prevent  falls. This information is not intended to replace advice given to you by your health care provider. Make sure you discuss any questions you have with your health care provider. Document Released: 02/25/2009 Document Revised: 10/07/2015 Document Reviewed: 06/05/2014 Elsevier Interactive Patient Education  2017 [employer]

## 2022-11-22 NOTE — Progress Notes (Signed)
 Subjective:   Kara [name] is a 77 y.o. female who presents for Medicare Annual (Subsequent) preventive examination.  Visit Complete: Virtual  I connected with  Joaquin Music on 11/22/22 by a audio enabled telemedicine application and verified that I am speaking with the correct person using two identifiers.  Patient Location: Home  Provider Location: Home Office  I discussed the limitations of evaluation and management by telemedicine. The patient expressed understanding and agreed to proceed.  Patient Medicare AWV questionnaire was completed by the patient on 11/21/2022; I have confirmed that all information answered by patient is correct and no changes since this date.  Review of Systems     Cardiac Risk Factors include: advanced age (>44men, >50 women);diabetes mellitus;dyslipidemia;hypertension;obesity (BMI >30kg/m2)     Objective:    Today's Vitals   11/22/22 1119  Weight: 216 lb (98 kg)  Height: 5\' 6"  (1.676 m)   Body mass index is 34.86 kg/m.     05/02/2022   10:12 AM 04/28/2022   12:50 PM 10/31/2021   10:45 AM 06/21/2021    4:23 PM 10/06/2020    3:12 PM 02/19/2020    9:58 AM 11/26/2019    3:32 PM  Advanced Directives  Does Patient Have a Medical Advance Directive? Yes Yes Yes No Yes Yes Yes  Type of Estate agent of [place];Living will Healthcare Power of [place];Living will;Out of facility DNR (pink MOST or yellow form) Healthcare Power of [place];Living will  Healthcare Power of [place];Living will Healthcare Power of [employer] of [place];Living will  Does patient want to make changes to medical advance directive?     No - Patient declined  Yes (MAU/Ambulatory/Procedural Areas - Information given)  Copy of Healthcare Power of Attorney in Chart?   No - copy requested  No - copy requested  No - copy requested  Would patient like information on creating a medical advance directive?    No - Patient declined        Current Medications (verified) Outpatient Encounter Medications as of 11/22/2022  Medication Sig   acetaminophen (TYLENOL) 500 MG tablet Take 500 mg by mouth every 8 (eight) hours as needed for moderate pain.   albuterol (VENTOLIN HFA) 108 (90 Base) MCG/ACT inhaler INHALE 1 TO 2 PUFFS INTO THE LUNGS EVERY 6 HOURS AS NEEDED FOR WHEEZING OR SHORTNESS OF BREATH   brimonidine (ALPHAGAN) 0.2 % ophthalmic solution Place 1 drop into both eyes 2 (two) times daily.   Carboxymethylcell-Glycerin PF (REFRESH RELIEVA PF) 0.5-1 % SOLN Place 1 drop into both eyes in the morning, at noon, in the evening, and at bedtime.   hydrochlorothiazide (HYDRODIURIL) 12.5 MG tablet Take 1 tablet (12.5 mg total) by mouth daily. (Patient taking differently: Take 12.5 mg by mouth daily in the afternoon.)   latanoprost (XALATAN) 0.005 % ophthalmic solution Place 1 drop into both eyes at bedtime.   Misc Natural Products (SAMBUCUS ELDERBERRY VITAMIN C MT) Take 1 each by mouth daily. Gummy (Patient not taking: Reported on 11/09/2022)   olopatadine (PATADAY) 0.1 % ophthalmic solution Place 1 drop into both eyes 2 (two) times daily. (Patient taking differently: Place 1 drop into both eyes daily in the afternoon.)   pantoprazole (PROTONIX) 40 MG tablet TAKE 1 TABLET(40 MG) BY MOUTH DAILY BEFORE BREAKFAST (Patient taking differently: Take 40 mg by mouth daily as needed (indigestion/heartburn.).)   SYNTHROID 100 MCG tablet Take 1 tablet (100 mcg total) by mouth daily before breakfast.   Thiamine HCl (VITAMIN B-1  PO) Take 1 tablet by mouth in the morning.   No facility-administered encounter medications on file as of 11/22/2022.    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  The STAT Breast cancer panel offered by [facility] includes sequencing and rearrangement analysis for the following 9 genes:  ATM, BRCA1, BRCA2, CDH1, CHEK2, PALB2, PTEN, STK11 and TP53.   The report date is August 09, 2019.   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 fibroids   BIOPSY  05/02/2022   Procedure: BIOPSY;  Surgeon: Lanelle Bal, DO;  [facility];  Service: Endoscopy;;   BREAST BIOPSY Left 05/2019   left breast DCIS   BREAST LUMPECTOMY Left 06/2019   DCIS   BREAST LUMPECTOMY WITH RADIOACTIVE SEED LOCALIZATION Left 07/08/2019   Procedure: LEFT BREAST LUMPECTOMY WITH RADIOACTIVE SEED LOCALIZATION;  Surgeon: Griselda Miner, MD;   Location: [facility];  Service: General;  Laterality: Left;   BREAST SURGERY N/A    [facility] 08/30/2020   CESAREAN SECTION     CESAREAN SECTION N/A    [facility] 08/30/2020   CHOLECYSTECTOMY     COLONOSCOPY  2011   [place]: two 3-4 mm sessile polyps, hyperplastic, sigmoid diverticula, TI appeared normal.    COLONOSCOPY WITH PROPOFOL N/A 05/02/2022   Procedure: COLONOSCOPY WITH PROPOFOL;  Surgeon: Lanelle Bal, DO;  [facility];  Service: Endoscopy;  Laterality: N/A;  11:30 am   ESOPHAGOGASTRODUODENOSCOPY  2011   [place]: non-bleeding erosive gastropathy, normal duodenum. path: unremarkable duodenum, negative H.pylori, minimal esophagitis    ESOPHAGOGASTRODUODENOSCOPY (EGD) WITH PROPOFOL N/A 05/02/2022   Procedure: ESOPHAGOGASTRODUODENOSCOPY (EGD) WITH PROPOFOL;  Surgeon: Lanelle Bal, DO;  [facility];  Service: Endoscopy;  Laterality: N/A;   EYE SURGERY  2022   POLYPECTOMY  05/02/2022   Procedure: POLYPECTOMY;  Surgeon: Lanelle Bal, DO;  [facility];  Service: Endoscopy;;   TONSILLECTOMY  1975   Family History  Problem Relation Age of Onset   Breast cancer Maternal Grandmother        dx over 72   Prostate cancer Maternal Grandfather    Arthritis Mother    COPD Mother    Breast cancer Mother 68       second at age 41   Hearing loss Mother    Cancer Father        throat   Stroke Father    Cancer Maternal Aunt 44       [name], cervical cancer   Breast cancer Maternal Aunt        dx over 56   Breast cancer Paternal Aunt        dx under 50   Prostate cancer Maternal Uncle        dx over 25   Cancer Other        father's maternal cousin was Henreietta Lacks, Cervical   Cancer Maternal Uncle        unknown cancer   Breast cancer Paternal Aunt        dx over 93   Colon cancer Neg Hx    Allergic rhinitis Neg Hx    Angioedema Neg Hx    Asthma Neg Hx    Atopy Neg Hx    Eczema Neg Hx    Urticaria Neg  Hx    Immunodeficiency Neg Hx    Social History   Socioeconomic History   Marital status: Divorced    Spouse name: Not on file   Number of children: 2   Years of education: 15   Highest education level: Not on file  Occupational History   Occupation: retired    Comment: Loss adjuster, chartered at a Database administrator  Tobacco Use   Smoking status: Former    Packs/day: 0.50    Years: 15.00    Additional pack years: 0.00    Total pack years: 7.50    Types: Cigarettes    Quit date: 06/15/1998    Years since quitting: 24.4   Smokeless tobacco: Never   Tobacco comments:    quit 2002  Vaping Use   Vaping Use: Never used  Substance and Sexual Activity   Alcohol use: No   Drug use: Never   Sexual activity: Not Currently    Birth control/protection: Surgical, None    Comment: hyst  Other Topics Concern   Not on file  Social History Narrative   Lives alone   2 grown children- one in [place] and one here   Grandchildren       Moved to [place] from [place] for aging mother who is 39 with Alzheimers      Maternal Aunt is Animator of the [employer] cancer call line      Enjoy: gym, time with friends       Diet: eats all food groups   Caffeine: 2 diet cokes daily    Water: 2-4 cups daily       Wears seat belt    Does not use phone while driving    Smoke and carbon detectors at home   [employer]    No weapons       Right Handed    Lives alone in a one story home    Retired   International aid/development worker of Health   Financial Resource Strain: Low Risk  (11/22/2022)  Overall Financial Resource Strain (CARDIA)    Difficulty of Paying Living Expenses: Not hard at all  Food Insecurity: No Food Insecurity (11/22/2022)   Hunger Vital Sign    Worried About Running Out of Food in the Last Year: Never true    Ran Out of Food in the Last Year: Never true  Transportation Needs: No Transportation Needs (11/22/2022)   PRAPARE - Administrator, Civil Service  (Medical): No    Lack of Transportation (Non-Medical): No  Physical Activity: Sufficiently Active (11/22/2022)   Exercise Vital Sign    Days of Exercise per Week: 7 days    Minutes of Exercise per Session: 60 min  Stress: No Stress Concern Present (11/22/2022)   [employer] of Occupational Health - Occupational Stress Questionnaire    Feeling of Stress : Not at all  Social Connections: Moderately Isolated (11/22/2022)   Social Connection and Isolation Panel [NHANES]    Frequency of Communication with Friends and Family: More than three times a week    Frequency of Social Gatherings with Friends and Family: More than three times a week    Attends Religious Services: Never    Database administrator or Organizations: Yes    Attends Banker Meetings: 1 to 4 times per year    Marital Status: Divorced    Tobacco Counseling Counseling given: Yes Tobacco comments: quit 2002   Clinical Intake:  Pre-visit preparation completed: Yes  Pain : No/denies pain     BMI - recorded: 34.86 Nutritional Status: BMI > 30  Obese Nutritional Risks: None Diabetes: Yes CBG done?: No (telehealth visit) Did pt. bring in CBG monitor from home?: No  How often do you need to have someone help you when you read instructions, pamphlets, or other written materials from your doctor or pharmacy?: 1 - Never  Interpreter Needed?: No  Information entered by ::  Harvie Morua, CMA   Activities of Daily Living    11/22/2022   11:26 AM 11/21/2022   12:57 PM  In your present state of health, do you have any difficulty performing the following activities:  Hearing? 0 0  Vision? 0 0  Difficulty concentrating or making decisions? 0 0  Walking or climbing stairs? 0 0  Dressing or bathing? 0 0  Doing errands, shopping? 0 0  Preparing Food and eating ? N N  Using the Toilet? N N  In the past six months, have you accidently leaked urine? N N  Do you have problems with loss of bowel control? N N   Managing your Medications? N N  Managing your Finances? N N  Housekeeping or managing your Housekeeping? N N    Patient Care Team: Anabel Halon, MD as PCP - General (Internal Medicine) Thurmon Fair, MD as PCP - Cardiology (Cardiology) Jena Gauss Gerrit Friends, MD as Consulting Physician (Gastroenterology) Griselda Miner, MD as Consulting Physician (General Surgery) Serena Croissant, MD as Consulting Physician (Hematology and Oncology) Antony Blackbird, MD as Consulting Physician (Radiation Oncology) Lanelle Bal, DO as Consulting Physician (Gastroenterology) Antony Madura, MD as Consulting Physician (Neurology)  Indicate any recent Medical Services you may have received from other than [facility] providers in the past year (date may be approximate).     Assessment:   This is a routine wellness examination for Emmajean.  Hearing/Vision screen Hearing Screening - Comments:: Patient denies any hearing difficulties.    Dietary issues and exercise activities discussed:     Goals Addressed  This Visit's Progress     Exercise 150 minutes per week (moderate activity)   On track     Weight (lb) < 20 lb (9.1 kg) (pt-stated)   216 lb (98 kg)      Depression Screen    11/22/2022   11:26 AM 07/17/2022    8:42 AM 04/12/2022    4:12 PM 02/15/2022   11:12 AM 12/05/2021    4:27 PM 11/11/2021   10:39 AM 11/04/2021    2:54 PM  PHQ 2/9 Scores  PHQ - 2 Score 0 0 0 0 0 0 0  PHQ- 9 Score  3         Fall Risk    11/22/2022   11:26 AM 11/21/2022   12:57 PM 11/02/2022   11:43 AM 07/17/2022    8:41 AM 04/28/2022   12:49 PM  Fall Risk   Falls in the past year? 0 0 0 0 0  Number falls in past yr: 0   0 0  Injury with Fall? 0   0 0  Risk for fall due to : No Fall Risks   No Fall Risks   Follow up Falls prevention discussed   Falls evaluation completed Falls evaluation completed    MEDICARE RISK AT HOME:  Medicare Risk at Home - 11/22/22 1125     Any stairs in or around the home?  No    If so, are there any without handrails? No    Home free of loose throw rugs in walkways, pet beds, electrical cords, etc? Yes    Adequate lighting in your home to reduce risk of falls? Yes    Life alert? No    Use of a cane, walker or w/c? No    Grab bars in the bathroom? No    Shower chair or bench in shower? No    Elevated toilet seat or a handicapped toilet? Yes             TIMED UP AND GO:  Was the test performed?  No    Cognitive Function:        11/22/2022   11:25 AM 10/31/2021   10:48 AM  6CIT Screen  What Year? 0 points 0 points  What month? 0 points 0 points  What time? 0 points 0 points  Count back from 20 0 points 0 points  Months in reverse 0 points 0 points  Repeat phrase 0 points 0 points  Total Score 0 points 0 points    Immunizations Immunization History  Administered Date(s) Administered   Fluad Quad(high Dose 65+) 02/11/2020, 01/20/2021, 01/25/2022   Influenza, High Dose Seasonal PF 02/08/2018, 02/17/2019   Influenza,inj,Quad PF,6+ Mos 02/28/2017   Influenza-Unspecified 02/28/2017   Moderna Covid-19 Vaccine Bivalent Booster 16yrs & up 02/03/2022   PFIZER(Purple Top)SARS-COV-2 Vaccination 06/06/2019, 06/24/2019, 04/21/2020, 10/07/2020, 02/22/2021   Pneumococcal Conjugate-13 07/05/2016   Pneumococcal Polysaccharide-23 04/29/2019   Td 12/23/2018   Zoster Recombinant(Shingrix) 06/06/2021    TDAP status: Up to date  Flu Vaccine status: Up to date  Pneumococcal vaccine status: Up to date  Covid-19 vaccine status: Information provided on how to obtain vaccines.   Qualifies for Shingles Vaccine? Yes   Zostavax completed No   Shingrix Completed?: No.    Education has been provided regarding the importance of this vaccine. Patient has been advised to call insurance company to determine out of pocket expense if they have not yet received this vaccine. Advised may also receive vaccine at local pharmacy or  Health Dept. Verbalized acceptance and  understanding.  Screening Tests Health Maintenance  Topic Date Due   Diabetic kidney evaluation - Urine ACR  08/17/2018   Zoster Vaccines- Shingrix (2 of 2) 08/01/2021   COVID-19 Vaccine (7 - 2023-24 season) 03/31/2022   Medicare Annual Wellness (AWV)  11/01/2022   INFLUENZA VACCINE  12/14/2022   FOOT EXAM  04/13/2023   HEMOGLOBIN A1C  04/20/2023   OPHTHALMOLOGY EXAM  10/10/2023   Diabetic kidney evaluation - eGFR measurement  10/13/2023   DTaP/Tdap/Td (2 - Tdap) 12/22/2028   Colonoscopy  05/02/2032   Pneumonia Vaccine 2+ Years old  Completed   DEXA SCAN  Completed   Hepatitis C Screening  Completed   HPV VACCINES  Aged Out    Health Maintenance  Health Maintenance Due  Topic Date Due   Diabetic kidney evaluation - Urine ACR  08/17/2018   Zoster Vaccines- Shingrix (2 of 2) 08/01/2021   COVID-19 Vaccine (7 - 2023-24 season) 03/31/2022   Medicare Annual Wellness (AWV)  11/01/2022    Colorectal cancer screening: Type of screening: Colonoscopy. Completed 05/02/2022. Repeat every 10 years  Mammogram status: Completed 06/14/2022. Repeat every year  Bone Density status: Completed 03/31/2019. Results reflect: Bone density results: OSTEOPENIA. Repeat every 2-5 years.  Lung Cancer Screening: (Low Dose CT Chest recommended if Age 18-80 years, 20 pack-year currently smoking OR have quit w/in 15years.) does not qualify.   Additional Screening:  Hepatitis C Screening: does not qualify; Completed 04/07/2019  Vision Screening: Recommended annual ophthalmology exams for early detection of glaucoma and other disorders of the eye. Is the patient up to date with their annual eye exam?  Yes  Who is the provider or what is the name of the office in which the patient attends annual eye exams? [facility] If pt is not established with a provider, would they like to be referred to a provider to establish care? No .   Dental Screening: Recommended annual dental exams for proper oral  hygiene  Diabetic Foot Exam: Diabetic Foot Exam: Completed 04/12/2022  Community Resource Referral / Chronic Care Management: CRR required this visit?  No   CCM required this visit?  No     Plan:     I have personally reviewed and noted the following in the patient's chart:   Medical and social history Use of alcohol, tobacco or illicit drugs  Current medications and supplements including opioid prescriptions. Patient is not currently taking opioid prescriptions. Functional ability and status Nutritional status Physical activity Advanced directives List of other physicians Hospitalizations, surgeries, and ER visits in previous 12 months Vitals Screenings to include cognitive, depression, and falls Referrals and appointments  In addition, I have reviewed and discussed with patient certain preventive protocols, quality metrics, and best practice recommendations. A written personalized care plan for preventive services as well as general preventive health recommendations were provided to patient.   Any medications not marked as taking were not mentioned by the patient (or their caregiver if applicable) when reconciling the medications.  Because this visit was a virtual/telehealth visit,  certain criteria was not obtained, such a blood pressure, CBG if patient is a diabetic, and timed up and go.    Jordan Hawks Landan Fedie, CMA   11/22/2022   After Visit Summary: (MyChart) Due to this being a telephonic visit, the after visit summary with patients personalized plan was offered to patient via MyChart   Nurse Notes: patient would like a referral to an exercise and weight management  program at the [facility]. She stated that it is a 6 week program but requires a referral from her PCP.

## 2022-11-23 ENCOUNTER — Ambulatory Visit (HOSPITAL_COMMUNITY): Payer: Medicare Other | Admitting: Anesthesiology

## 2022-11-23 ENCOUNTER — Other Ambulatory Visit: Payer: Self-pay

## 2022-11-23 ENCOUNTER — Encounter (HOSPITAL_COMMUNITY)
Admission: RE | Disposition: A | Discharge status: Home / Self Care | Payer: Self-pay | Source: Home / Self Care | Attending: Gastroenterology

## 2022-11-23 ENCOUNTER — Encounter (HOSPITAL_COMMUNITY): Payer: Self-pay | Admitting: Gastroenterology

## 2022-11-23 ENCOUNTER — Ambulatory Visit (HOSPITAL_COMMUNITY)
Admission: RE | Admit: 2022-11-23 | Discharge: 2022-11-23 | Disposition: A | Discharge status: Home / Self Care | Payer: Medicare Other | Attending: Gastroenterology | Admitting: Gastroenterology

## 2022-11-23 ENCOUNTER — Ambulatory Visit (HOSPITAL_BASED_OUTPATIENT_CLINIC_OR_DEPARTMENT_OTHER): Payer: Medicare Other | Admitting: Anesthesiology

## 2022-11-23 DIAGNOSIS — K869 Disease of pancreas, unspecified: Secondary | ICD-10-CM | POA: Diagnosis not present

## 2022-11-23 DIAGNOSIS — K2289 Other specified disease of esophagus: Secondary | ICD-10-CM | POA: Insufficient documentation

## 2022-11-23 DIAGNOSIS — I899 Noninfective disorder of lymphatic vessels and lymph nodes, unspecified: Secondary | ICD-10-CM | POA: Insufficient documentation

## 2022-11-23 DIAGNOSIS — E782 Mixed hyperlipidemia: Secondary | ICD-10-CM | POA: Diagnosis not present

## 2022-11-23 DIAGNOSIS — R7989 Other specified abnormal findings of blood chemistry: Secondary | ICD-10-CM

## 2022-11-23 DIAGNOSIS — Z87891 Personal history of nicotine dependence: Secondary | ICD-10-CM | POA: Diagnosis not present

## 2022-11-23 DIAGNOSIS — K449 Diaphragmatic hernia without obstruction or gangrene: Secondary | ICD-10-CM

## 2022-11-23 DIAGNOSIS — K7689 Other specified diseases of liver: Secondary | ICD-10-CM | POA: Diagnosis not present

## 2022-11-23 DIAGNOSIS — R1011 Right upper quadrant pain: Secondary | ICD-10-CM | POA: Diagnosis not present

## 2022-11-23 DIAGNOSIS — K222 Esophageal obstruction: Secondary | ICD-10-CM | POA: Insufficient documentation

## 2022-11-23 DIAGNOSIS — R748 Abnormal levels of other serum enzymes: Secondary | ICD-10-CM | POA: Diagnosis not present

## 2022-11-23 DIAGNOSIS — Z6835 Body mass index (BMI) 35.0-35.9, adult: Secondary | ICD-10-CM

## 2022-11-23 DIAGNOSIS — R109 Unspecified abdominal pain: Secondary | ICD-10-CM | POA: Diagnosis not present

## 2022-11-23 DIAGNOSIS — G4730 Sleep apnea, unspecified: Secondary | ICD-10-CM | POA: Diagnosis not present

## 2022-11-23 HISTORY — PX: EUS: SHX5427

## 2022-11-23 HISTORY — PX: ESOPHAGOGASTRODUODENOSCOPY: SHX5428

## 2022-11-23 SURGERY — UPPER ENDOSCOPIC ULTRASOUND (EUS) RADIAL
Anesthesia: Monitor Anesthesia Care

## 2022-11-23 MED ORDER — PROPOFOL 1000 MG/100ML IV EMUL
INTRAVENOUS | Status: AC
  Filled 2022-11-23: qty 100

## 2022-11-23 MED ORDER — SODIUM CHLORIDE 0.9 % IV SOLN: INTRAVENOUS | Status: DC

## 2022-11-23 MED ORDER — LACTATED RINGERS IV SOLN: INTRAVENOUS | Status: DC

## 2022-11-23 MED ORDER — LIDOCAINE 2% (20 MG/ML) 5 ML SYRINGE
INTRAMUSCULAR | Status: DC | PRN
  Administered 2022-11-23: 40 mg via INTRAVENOUS

## 2022-11-23 MED ORDER — PROPOFOL 10 MG/ML IV BOLUS
INTRAVENOUS | Status: DC | PRN
  Administered 2022-11-23 (×2): 30 mg via INTRAVENOUS
  Administered 2022-11-23: 100 mg via INTRAVENOUS
  Administered 2022-11-23: 60 mg via INTRAVENOUS
  Administered 2022-11-23: 10 mg via INTRAVENOUS

## 2022-11-23 MED ORDER — GLYCOPYRROLATE 0.2 MG/ML IJ SOLN
INTRAMUSCULAR | Status: DC | PRN
  Administered 2022-11-23: .1 mg via INTRAVENOUS

## 2022-11-23 MED ORDER — PROPOFOL 500 MG/50ML IV EMUL
INTRAVENOUS | Status: DC | PRN
  Administered 2022-11-23: 80 ug/kg/min via INTRAVENOUS

## 2022-11-23 NOTE — Anesthesia Postprocedure Evaluation (Signed)
Anesthesia Post Note  Patient: JAMIA HOBAN  Procedure(s) Performed: UPPER ENDOSCOPIC ULTRASOUND (EUS) RADIAL ESOPHAGOGASTRODUODENOSCOPY (EGD)     Patient location during evaluation: Endoscopy Anesthesia Type: MAC Level of consciousness: awake and alert Pain management: pain level controlled Vital Signs Assessment: post-procedure vital signs reviewed and stable Respiratory status: spontaneous breathing, nonlabored ventilation, respiratory function stable and patient connected to nasal cannula oxygen Cardiovascular status: blood pressure returned to baseline and stable Postop Assessment: no apparent nausea or vomiting Anesthetic complications: no   No notable events documented.  Last Vitals:  Vitals:   11/23/22 1343 11/23/22 1400  BP: (!) 146/62 134/89  Pulse: 87 93  Resp: (!) 22 (!) 21  Temp:    SpO2: 99% 99%    Last Pain:  Vitals:   11/23/22 1400  TempSrc:   PainSc: 0-No pain                 Trevor Iha

## 2022-11-23 NOTE — Op Note (Signed)
[facility] Patient Name: Kara [name] Procedure Date: 11/23/2022 MRN: [id number] Attending MD: Corliss Parish , MD, [id number] Date of Birth: 1945-05-26 CSN: [id number] Age: 77 Admit Type: Outpatient Procedure:                Upper EUS Indications:              Elevated alkaline phosphatase, Suspected                            choledocholithiasis, Abdominal pain in the right                            upper quadrant Providers:                Corliss Parish, MD, Margaree Mackintosh, RN,                            Harrington Challenger, Technician Referring MD:             Leanna Battles. Hulen Luster Carver, DO, Rutwik K.                            Patel Medicines:                Monitored Anesthesia Care Complications:            No immediate complications. Estimated Blood Loss:     Estimated blood loss was minimal. Procedure:                Pre-Anesthesia Assessment:                           - Prior to the procedure, a History and Physical                            was performed, and patient medications and                            allergies were reviewed. The patient's tolerance of                            previous anesthesia was also reviewed. The risks                            and benefits of the procedure and the sedation                            options and risks were discussed with the patient.                            All questions were answered, and informed consent                            was obtained. [Anticoagulant Agents] [QMVH Prior to                            Procedure]. [ASA Grade].  After reviewing the risks                            and benefits, the patient was deemed in                            satisfactory condition to undergo the procedure.                           After obtaining informed consent, the endoscope was                            passed under direct vision. Throughout the                            procedure,  the patient's blood pressure, pulse, and                            oxygen saturations were monitored continuously. The                            [id number]-[id number] ([id number]) Olympus endoscope was introduced                            through the mouth, and advanced to the second part                            of duodenum. The [id number]-[id number] ([id number]) Olympus                            duodenoscope was introduced through the mouth, and                            advanced to the area of papilla. The [id number]                            ([id number]) Olympus linear ultrasound scope was                            introduced through the mouth, and advanced to the                            duodenum for ultrasound examination from the                            stomach and duodenum. The upper EUS was                            accomplished without difficulty. The patient                            tolerated the procedure. Scope In: Scope Out: Findings:      ENDOSCOPIC FINDING: :      No gross lesions were noted in the entire esophagus.  A non-obstructing Schatzki ring was found at the gastroesophageal       junction.      The Z-line was irregular and was found 35 cm from the incisors.      A 3 cm hiatal hernia was present.      No gross lesions were noted in the entire examined stomach.      No gross lesions were noted in the duodenal bulb, in the first portion       of the duodenum and in the second portion of the duodenum.      The major papilla was prominent but otherwise normal with bile drainage       noted appropriately.      ENDOSONOGRAPHIC FINDING: :      Pancreatic parenchymal abnormalities were noted in the pancreatic head,       genu of the pancreas, pancreatic body and pancreatic tail. These       consisted of diffuse echogenicity.      The diameter of the main pancreatic duct (MPD) measured:      - HOP 2 mm (head of pancreas)      - NOP 1.7 mm (genu of pancreas)      - BOP 0.8 mm (body  of the pancreas)      - TOP 0.5 mm (tail of the pancreas).      There was no sign of significant endosonographic abnormality in the       common bile duct (2.9 mm) and in the common hepatic duct (3.1 mm). No       stones, no biliary sludge and ducts of normal caliber were identified.      Endosonographic imaging of the ampulla showed no intramural       (subepithelial) lesion.      Anechoic lesions suggestive of multiple cysts were identified in the       visualized portion of the liver. The largest lesion measured 51 mm by 50       mm in maximal cross-sectional diameter. There was no associated solid       masslike areas.      No malignant-appearing lymph nodes were visualized in the celiac region       (level 20), peripancreatic region and porta hepatis region.      The celiac region was visualized. Impression:               EGD impression:                           - No gross lesions in the entire esophagus.                            Non-obstructing Schatzki ring. Z-line irregular, 35                            cm from the incisors.                           - 3 cm hiatal hernia.                           - No gross lesions in the entire stomach.                           -  No gross lesions in the duodenal bulb, in the                            first portion of the duodenum and in the second                            portion of the duodenum.                           - Normal major papilla.                           EUS Impression:                           - Pancreatic parenchymal abnormalities consisting                            of diffuse echogenicity were noted in the                            pancreatic head, genu of the pancreas, pancreatic                            body and pancreatic tail.                           - Main pancreatic duct (MPD) diameter was measured.                            Endosonographically, the MPD had a normal                             appearance.                           - There was no sign of significant pathology in the                            common bile duct and in the common hepatic duct.                           - Multiple cystic lesions were found in the                            visualized portion of the liver, the largest                            measuring 51 mm by 50 mm.                           - No malignant-appearing lymph nodes were                            visualized in the celiac region (level 20),  peripancreatic region and porta hepatis region. Moderate Sedation:      Not Applicable - Patient had care per Anesthesia. Recommendation:           - The patient will be observed post-procedure,                            until all discharge criteria are met.                           - Discharge patient to home.                           - Patient has a contact number available for                            emergencies. The signs and symptoms of potential                            delayed complications were discussed with the                            patient. Return to normal activities tomorrow.                            Written discharge instructions were provided to the                            patient.                           - Resume previous diet.                           - Observe patient's clinical course.                           - Follow-up with primary gastroenterology team.                            Etiology of patient's recurring symptoms is not                            clearly defined at this point. But there is no                            evidence of retained choledocholithiasis or                            significant biliary or pancreatic etiology. Query                            functional abdominal pain in the utilization of a                            TCA or SSRI/SNRI in the future, if no other  etiology  is found.                           - Further workup and evaluation of elevated                            alkaline phosphatase with query liver biopsy in                            future.                           - No role for ERCP unless evidence of of biliary                            obstruction is noted on potential future liver                            biopsy.                           - The findings and recommendations were discussed                            with the patient.                           - The findings and recommendations were discussed                            with the patient's family. Procedure Code(s):        --- Professional ---                           [phone], Esophagogastroduodenoscopy, flexible,                            transoral; with endoscopic ultrasound examination                            limited to the esophagus, stomach or duodenum, and                            adjacent structures Diagnosis Code(s):        --- Professional ---                           K22.2, Esophageal obstruction                           K22.89, Other specified disease of esophagus                           K44.9, Diaphragmatic hernia without obstruction or                            gangrene  K86.9, Disease of pancreas, unspecified                           K76.89, Other specified diseases of liver                           I89.9, Noninfective disorder of lymphatic vessels                            and lymph nodes, unspecified                           R74.8, Abnormal levels of other serum enzymes                           R10.11, Right upper quadrant pain CPT copyright 2022 American Medical Association. All rights reserved. The codes documented in this report are preliminary and upon coder review may  be revised to meet current compliance requirements. Corliss Parish, MD 11/23/2022 1:57:47 PM Number of Addenda: 0

## 2022-11-23 NOTE — Discharge Instructions (Signed)
YOU HAD AN ENDOSCOPIC PROCEDURE TODAY: Refer to the procedure report and other information in the discharge instructions given to you for any specific questions about what was found during the examination. If this information does not answer your questions, please call [place] office at [phone] to clarify.   YOU SHOULD EXPECT: Some feelings of bloating in the abdomen. Passage of more gas than usual. Walking can help get rid of the air that was put into your GI tract during the procedure and reduce the bloating. If you had a lower endoscopy (such as a colonoscopy or flexible sigmoidoscopy) you may notice spotting of blood in your stool or on the toilet paper. Some abdominal soreness may be present for a day or two, also.  DIET: Your first meal following the procedure should be a light meal and then it is ok to progress to your normal diet. A half-sandwich or bowl of soup is an example of a good first meal. Heavy or fried foods are harder to digest and may make you feel nauseous or bloated. Drink plenty of fluids but you should avoid alcoholic beverages for 24 hours. If you had a esophageal dilation, please see attached instructions for diet.    ACTIVITY: Your care partner should take you home directly after the procedure. You should plan to take it easy, moving slowly for the rest of the day. You can resume normal activity the day after the procedure however YOU SHOULD NOT DRIVE, use power tools, machinery or perform tasks that involve climbing or major physical exertion for 24 hours (because of the sedation medicines used during the test).   SYMPTOMS TO REPORT IMMEDIATELY: A gastroenterologist can be reached at any hour. Please call [phone]  for any of the following symptoms:   Following upper endoscopy (EGD, EUS, ERCP, esophageal dilation) Vomiting of blood or coffee ground material  New, significant abdominal pain  New, significant chest pain or pain under the shoulder blades  Painful or  persistently difficult swallowing  New shortness of breath  Black, tarry-looking or red, bloody stools  FOLLOW UP:  If any biopsies were taken you will be contacted by phone or by letter within the next 1-3 weeks. Call [phone]  if you have not heard about the biopsies in 3 weeks.  Please also call with any specific questions about appointments or follow up tests.  

## 2022-11-23 NOTE — Anesthesia Procedure Notes (Signed)
Procedure Name: MAC Date/Time: 11/23/2022 12:55 PM  Performed by: Maurene Capes, CRNAPre-anesthesia Checklist: Patient identified, Emergency Drugs available, Suction available and Patient being monitored Patient Re-evaluated:Patient Re-evaluated prior to induction Oxygen Delivery Method: Simple face mask Induction Type: IV induction Airway Equipment and Method: Patient positioned with wedge pillow Placement Confirmation: positive ETCO2 Dental Injury: Teeth and Oropharynx as per pre-operative assessment

## 2022-11-23 NOTE — Transfer of Care (Signed)
Immediate Anesthesia Transfer of Care Note  Patient: Kara [name]  Procedure(s) Performed: UPPER ENDOSCOPIC ULTRASOUND (EUS) RADIAL  Patient Location: PACU and Endoscopy Unit  Anesthesia Type:MAC  Level of Consciousness: awake, alert , oriented, and patient cooperative  Airway & Oxygen Therapy: Patient Spontanous Breathing and Patient connected to face mask oxygen  Post-op Assessment: Report given to RN, Post -op Vital signs reviewed and stable, and Patient moving all extremities  Post vital signs: Reviewed and stable  Last Vitals:  Vitals Value Taken Time  BP    Temp    Pulse    Resp    SpO2      Last Pain:  Vitals:   11/23/22 1220  TempSrc: Temporal  PainSc: 0-No pain         Complications: No notable events documented.

## 2022-11-23 NOTE — H&P (Addendum)
GASTROENTEROLOGY PROCEDURE H&P NOTE   Primary Care Physician: Anabel Halon, MD  HPI: Kara [name] is a 77 y.o. female who presents for EGD/EUS for evaluation of abdominal pain and elevated LFTs with concern for choledocholithiasis.  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  The STAT Breast cancer panel offered by [facility] includes sequencing and rearrangement analysis for the following 9 genes:  ATM, BRCA1, BRCA2, CDH1, CHEK2, PALB2, PTEN, STK11 and TP53.   The report date is August 09, 2019.   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 fibroids   BIOPSY  05/02/2022   Procedure: BIOPSY;  Surgeon: Lanelle Bal, DO;  [facility];  Service: Endoscopy;;   BREAST BIOPSY Left 05/2019   left breast DCIS   BREAST LUMPECTOMY Left 06/2019   DCIS   BREAST LUMPECTOMY WITH RADIOACTIVE SEED LOCALIZATION Left 07/08/2019   Procedure: LEFT BREAST LUMPECTOMY WITH RADIOACTIVE SEED LOCALIZATION;  Surgeon: Griselda Miner, MD;  Location: [facility];  Service: General;  Laterality: Left;   BREAST SURGERY N/A    [employer] 08/30/2020   CESAREAN SECTION     CESAREAN SECTION N/A    [employer] 08/30/2020   CHOLECYSTECTOMY     COLONOSCOPY  2011   [place]: two 3-4 mm sessile polyps, hyperplastic, sigmoid diverticula, TI appeared normal.    COLONOSCOPY WITH PROPOFOL N/A 05/02/2022   Procedure: COLONOSCOPY WITH PROPOFOL;  Surgeon: Lanelle Bal, DO;  [facility]: [facility];  Service: Endoscopy;  Laterality: N/A;  11:30 am   ESOPHAGOGASTRODUODENOSCOPY  2011   [place]: non-bleeding erosive gastropathy, normal duodenum. path: unremarkable duodenum, negative H.pylori, minimal esophagitis    ESOPHAGOGASTRODUODENOSCOPY (EGD) WITH PROPOFOL N/A 05/02/2022   Procedure: ESOPHAGOGASTRODUODENOSCOPY (EGD) WITH PROPOFOL;  Surgeon: Lanelle Bal, DO;  [facility];  Service: Endoscopy;  Laterality: N/A;   EYE SURGERY  2022   POLYPECTOMY  05/02/2022   Procedure: POLYPECTOMY;  Surgeon: Marletta Lor,  Hennie Duos, DO;  Location: [facility];  Service: Endoscopy;;   TONSILLECTOMY  1975   No current facility-administered medications for this encounter.   No current facility-administered medications for this encounter. Allergies  Allergen Reactions   Coconut (Cocos Nucifera) Anaphylaxis   Other Shortness Of Breath    Covid booster pfizer     Prednisone Hypertension    Severe headache   Petrolatum Dermatitis    Caused incision irritation    Naproxen Other (See Comments)     headache   Family History  Problem Relation Age of Onset   Breast cancer Maternal Grandmother        dx over 84   Prostate cancer Maternal Grandfather    Arthritis Mother    COPD Mother    Breast cancer Mother 28       second at age 66   Hearing loss Mother    Cancer Father        throat   Stroke Father    Cancer Maternal Aunt 6       [name], cervical cancer   Breast cancer Maternal Aunt        dx over 35   Breast cancer Paternal Aunt        dx under 50   Prostate cancer Maternal Uncle        dx over 48   Cancer Other        father's maternal cousin was Henreietta Lacks, Cervical   Cancer Maternal Uncle        unknown cancer   Breast cancer Paternal Aunt        dx over 55   Colon cancer Neg Hx    Allergic rhinitis Neg Hx    Angioedema Neg Hx    Asthma Neg Hx    Atopy Neg Hx    Eczema Neg Hx    Urticaria Neg Hx    Immunodeficiency Neg Hx    Social History   Socioeconomic History   Marital status: Divorced    Spouse name: Not on file   Number of children: 2   Years of education: 15   Highest education level: Not on file  Occupational History   Occupation: retired    Comment: Loss adjuster, chartered at a Database administrator  Tobacco Use   Smoking status: Former    Current packs/day: 0.00    Average packs/day: 0.5 packs/day for 15.0 years (7.5 ttl pk-yrs)    Types: Cigarettes    Start date: 06/16/1983    Quit date: 06/15/1998    Years since quitting: 24.4   Smokeless tobacco: Never   Tobacco comments:    quit 2002  Vaping Use   Vaping status: Never Used  Substance and Sexual Activity   Alcohol use: No   Drug use: Never   Sexual activity: Not Currently    Birth control/protection: Surgical, None    Comment: hyst  Other Topics Concern   Not on file  Social History Narrative   Lives alone   2 grown children- one in [place] and one here   Grandchildren       Moved to [place] from [place] for aging mother who is 30 with Alzheimers       Maternal Aunt is Animator of the [employer] cancer call line      Enjoy: gym, time with friends       Diet: eats all food groups   Caffeine: 2 diet cokes daily  Water: 2-4 cups daily       Wears seat belt    Does not use phone while driving    Smoke and carbon detectors at home   Government social research officer    No weapons       Right Handed    Lives alone in a one story home    Retired   International aid/development worker of Health   Financial Resource Strain: Low Risk  (11/22/2022)   Overall Financial Resource Strain (CARDIA)    Difficulty of Paying Living Expenses: Not hard at all  Food Insecurity: No Food Insecurity (11/22/2022)   Hunger Vital Sign    Worried About Running Out of Food in the Last Year: Never true    Ran Out of Food in the Last Year: Never true  Transportation Needs: No Transportation Needs (11/22/2022)   PRAPARE - Administrator, Civil Service (Medical): No    Lack of Transportation (Non-Medical): No  Physical Activity: Sufficiently Active (11/22/2022)   Exercise Vital Sign    Days of Exercise per Week: 7 days    Minutes of Exercise per Session: 60 min  Stress: No Stress Concern Present (11/22/2022)   [employer] of Occupational Health - Occupational Stress Questionnaire    Feeling of Stress : Not at all  Social Connections: Moderately Isolated (11/22/2022)   Social Connection and Isolation Panel [NHANES]    Frequency of Communication with Friends and Family: More than three times a week    Frequency of Social Gatherings with Friends and Family: More than three times a week    Attends Religious Services: Never    Database administrator or Organizations: Yes    Attends Banker Meetings: 1 to 4 times per year    Marital Status: Divorced  Intimate Partner Violence: Not At Risk (11/22/2022)   Humiliation, Afraid, Rape, and Kick questionnaire    Fear of Current or Ex-Partner: No    Emotionally Abused: No    Physically Abused: No    Sexually Abused: No     Physical Exam: Today's Vitals   11/09/22 1552  Weight: 98 kg  Height: 5\' 6"  (1.676 m)   Body mass index is 34.86 kg/m. GEN: NAD EYE: Sclerae anicteric ENT: MMM CV: Non-tachycardic GI: TTP in RUQ 3-4/10 currently preprocedure  NEURO:  Alert & Oriented x 3  Lab Results: No results for input(s): "WBC", "HGB", "HCT", "PLT" in the last 72 hours. BMET No results for input(s): "NA", "K", "CL", "CO2", "GLUCOSE", "BUN", "CREATININE", "CALCIUM" in the last 72 hours. LFT No results for input(s): "PROT", "ALBUMIN", "AST", "ALT", "ALKPHOS", "BILITOT", "BILIDIR", "IBILI" in the last 72 hours. PT/INR No results for input(s): "LABPROT", "INR" in the last 72 hours.   Impression / Plan: This is a 78 y.o.female who presents for EGD/EUS for evaluation of abdominal pain and elevated LFTs with concern for choledocholithiasis.  The risks of an EUS including intestinal perforation, bleeding, infection, aspiration, and medication effects were discussed as was the possibility it may not give a definitive diagnosis if a biopsy is performed.  When a biopsy of the pancreas is done as part of the EUS, there is an additional risk of pancreatitis at the rate of about 1-2%.  It was explained that procedure related pancreatitis is typically mild, although it can be severe and even life threatening, which is why we do not perform random pancreatic biopsies and only biopsy a lesion/area we feel is concerning enough to warrant the risk.  The  risks and benefits of endoscopic evaluation/treatment were discussed with the patient and/or family; these include but are not limited to the risk of perforation, infection, bleeding, missed lesions, lack of diagnosis, severe illness requiring hospitalization, as well as anesthesia and sedation related illnesses.  The patient's history has been reviewed, patient examined, no change in status, and deemed stable for procedure.  The patient and/or family is agreeable to proceed.     Corliss Parish, MD [facility] Office # [phone]

## 2022-11-24 ENCOUNTER — Other Ambulatory Visit: Payer: Self-pay | Admitting: Gastroenterology

## 2022-11-24 ENCOUNTER — Telehealth: Payer: Self-pay | Admitting: *Deleted

## 2022-11-24 NOTE — Telephone Encounter (Signed)
Contacted regarding PREP Class referral. She is interested in participating at the [facility]. Class to begin 12/12/2022 T/TH 1330-1445. I will call her back to schedule her intake assessment prior to the start of class.

## 2022-11-27 ENCOUNTER — Telehealth: Payer: Self-pay | Admitting: *Deleted

## 2022-11-27 NOTE — Telephone Encounter (Signed)
Contacted to schedule PREP Class intake assessment. Scheduled for 12/05/2022 @ 3:00pm at the [facility].

## 2022-11-28 DIAGNOSIS — R7989 Other specified abnormal findings of blood chemistry: Secondary | ICD-10-CM | POA: Diagnosis not present

## 2022-11-29 ENCOUNTER — Telehealth: Payer: Self-pay

## 2022-11-29 LAB — HEPATIC FUNCTION PANEL
ALT: 17 IU/L (ref 0–32)
AST: 19 IU/L (ref 0–40)
Albumin: 3.8 g/dL (ref 3.8–4.8)
Alkaline Phosphatase: 149 IU/L — ABNORMAL HIGH (ref 44–121)
Bilirubin Total: 0.4 mg/dL (ref 0.0–1.2)
Bilirubin, Direct: 0.12 mg/dL (ref 0.00–0.40)
Total Protein: 6.7 g/dL (ref 6.0–8.5)

## 2022-11-29 NOTE — Telephone Encounter (Signed)
Pt's lab in her chart

## 2022-12-01 ENCOUNTER — Telehealth: Payer: Self-pay | Admitting: Gastroenterology

## 2022-12-01 NOTE — Telephone Encounter (Signed)
Dr. Marletta Lor, patient reached out wanting to know results of her recent EUS that you ordered for prominent ampulla and chronic ruq pain.   FYI patient had declined MRI/MRCP due to pending CT chest/abd/pelvis but completed EUS without noted ampullary abnormalities or stones in biliary tree. Pancreas ?fatty. Please look at findings and advise.   Dr. Meridee Score mentioned possible liver biopsy for elevated AP, GGT.   Patient has full body CT scheduled later this month for lymphadenopathy followed by oncology.

## 2022-12-04 ENCOUNTER — Ambulatory Visit (HOSPITAL_COMMUNITY)
Admission: RE | Admit: 2022-12-04 | Discharge: 2022-12-04 | Disposition: A | Discharge status: Home / Self Care | Payer: Medicare Other | Source: Ambulatory Visit | Attending: Hematology and Oncology | Admitting: Hematology and Oncology

## 2022-12-04 ENCOUNTER — Encounter (HOSPITAL_COMMUNITY): Payer: Self-pay

## 2022-12-04 DIAGNOSIS — D0512 Intraductal carcinoma in situ of left breast: Secondary | ICD-10-CM

## 2022-12-04 NOTE — Telephone Encounter (Signed)
I reviewed your message to patient and I agree. Overall her EUS was largely unremarkable which is great news. Follow up in our office to discuss further next steps which may be trial of TCA.

## 2022-12-04 NOTE — Telephone Encounter (Signed)
Please make pt aware of Dr. Queen Blossom advise. Schedule ov with Dr. Marletta Lor.

## 2022-12-05 ENCOUNTER — Encounter: Payer: Self-pay | Admitting: *Deleted

## 2022-12-05 NOTE — Telephone Encounter (Signed)
noted 

## 2022-12-05 NOTE — Telephone Encounter (Signed)
Phoned and advised the pt of her result note and a follow up appt with Dr Marletta Lor. Pt expressed understanding and was transferred to the front desk and also this message is being sent to the front desk to schedule the pt.  Please schedule with Dr Marletta Lor

## 2022-12-06 DIAGNOSIS — H40021 Open angle with borderline findings, high risk, right eye: Secondary | ICD-10-CM | POA: Diagnosis not present

## 2022-12-06 NOTE — Progress Notes (Signed)
[facility] PREP Evaluation  Patient Details  Name: Kara [name] MRN: [id number] Date of Birth: Apr 05, 1946 Age: 77 y.o. PCP: Anabel Halon, MD  Vitals:   12/05/22 1500  BP: 108/76  Pulse: 88  Resp: 18  SpO2: 99%  Weight: 222 lb (100.7 kg)  Height: 5\' 6"  (1.676 m)     [facility] Eval - 12/05/22 1520       [facility] "PREP" Location   [facility]PREP" Location [facility]      Referral    Referring Provider Patel    Reason for referral High Cholesterol;Inactivity;Obesitity/Overweight;Other   Pre-diabetes   Program Start Date 12/12/22    Program End Date 03/01/23      Measurement   Waist Circumference 40.75 inches    Hip Circumference 41.75 inches    Body fat 47.7 percent      Information for Trainer   Goals weight loss 10-15lbs in 12 weeks, establish an exercise routine,healthier diet, structure health behaviors    Current Exercise walking    Current Barriers August and September vacations    Medications that affect exercise Asthma inhaler   Rarely uses. Typically for seasonal allergies.     Mobility and Daily Activities   I find it easy to walk up or down two or more flights of stairs. 4    I have no trouble taking out the trash. 4    I do housework such as vacuuming and dusting on my own without difficulty. 4    I can easily lift a gallon of milk (8lbs). 4    I can easily walk a mile. 4    I have no trouble reaching into high cupboards or reaching down to pick up something from the floor. 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 4      Mobility and Daily Activities   I feel younger than my age. 4    I feel independent. 4    I feel energetic. 4    I live an active life.  4    I feel strong. 3    I feel healthy. 4    I feel active as other people my age. 4      How fit and strong are you.   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  The STAT Breast cancer panel offered by [facility] includes sequencing and rearrangement analysis for the following 9 genes:  ATM, BRCA1, BRCA2, CDH1, CHEK2, PALB2, PTEN, STK11 and TP53.   The report date is August 09, 2019.   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 fibroids   BIOPSY  05/02/2022   Procedure: BIOPSY;  Surgeon: Lanelle Bal, DO;  [facility];  Service: Endoscopy;;   BREAST BIOPSY Left 05/2019   left breast DCIS   BREAST LUMPECTOMY Left 06/2019   DCIS   BREAST LUMPECTOMY WITH RADIOACTIVE SEED  LOCALIZATION Left 07/08/2019   Procedure: LEFT BREAST LUMPECTOMY WITH RADIOACTIVE SEED LOCALIZATION;  Surgeon: Griselda Miner, MD;  Location: [facility];  Service: General;  Laterality: Left;   BREAST SURGERY N/A    Phreesia 08/30/2020   CESAREAN SECTION     CESAREAN SECTION N/A    Phreesia 08/30/2020   CHOLECYSTECTOMY     COLONOSCOPY  2011   [place]: two 3-4 mm sessile polyps, hyperplastic, sigmoid diverticula, TI appeared normal.    COLONOSCOPY WITH PROPOFOL N/A 05/02/2022   Procedure: COLONOSCOPY WITH PROPOFOL;  Surgeon: Lanelle Bal, DO;  [facility];  Service: Endoscopy;  Laterality: N/A;  11:30 am   ESOPHAGOGASTRODUODENOSCOPY  2011   [place]: non-bleeding erosive gastropathy, normal duodenum. path: unremarkable duodenum, negative H.pylori, minimal esophagitis    ESOPHAGOGASTRODUODENOSCOPY N/A 11/23/2022   Procedure: ESOPHAGOGASTRODUODENOSCOPY (EGD);  Surgeon: Lemar Lofty., MD;  Location: [facility];  Service: Gastroenterology;  Laterality: N/A;   ESOPHAGOGASTRODUODENOSCOPY (EGD) WITH PROPOFOL N/A 05/02/2022   Procedure: ESOPHAGOGASTRODUODENOSCOPY (EGD) WITH PROPOFOL;  Surgeon: Lanelle Bal, DO;  [facility];  Service: Endoscopy;  Laterality: N/A;   EUS N/A 11/23/2022   Procedure: UPPER ENDOSCOPIC ULTRASOUND (EUS) RADIAL;  Surgeon: Lemar Lofty., MD;  Location: [facility];  Service: Gastroenterology;  Laterality: N/A;   EYE SURGERY  2022   POLYPECTOMY  05/02/2022   Procedure: POLYPECTOMY;  Surgeon: Lanelle Bal, DO;  [facility];  Service: Endoscopy;;   TONSILLECTOMY  1975   Social History   Tobacco Use  Smoking Status Former   Current packs/day: 0.00   Average packs/day: 0.5 packs/day for 15.0 years (7.5 ttl pk-yrs)   Types: Cigarettes   Start date: 06/16/1983   Quit date: 06/15/1998   Years since quitting: 24.4  Smokeless Tobacco Never  Tobacco Comments   quit 2002    [name] 12/06/2022, 12:14 PM

## 2022-12-06 NOTE — Progress Notes (Signed)
Patient Care Team: Anabel Halon, MD as PCP - General (Internal Medicine) Thurmon Fair, MD as PCP - Cardiology (Cardiology) Jena Gauss Gerrit Friends, MD as Consulting Physician (Gastroenterology) Griselda Miner, MD as Consulting Physician (General Surgery) Serena Croissant, MD as Consulting Physician (Hematology and Oncology) Antony Blackbird, MD as Consulting Physician (Radiation Oncology) Lanelle Bal, DO as Consulting Physician (Gastroenterology) Antony Madura, MD as Consulting Physician (Neurology)  DIAGNOSIS:  Encounter Diagnosis  Name Primary?   Ductal carcinoma in situ (DCIS) of left breast Yes    SUMMARY OF ONCOLOGIC HISTORY: Oncology History  Ductal carcinoma in situ (DCIS) of left breast  06/04/2019 Cancer Staging   Staging form: Breast, AJCC 8th Edition - Clinical stage from 06/04/2019: Stage 0 (cTis (DCIS), cN0, cM0, ER-, PR-)   06/11/2019 Initial Diagnosis   Screening mammogram showed left breast calcifications. Diagnostic mammogram showed left breast calcifications spanning 1.1cm. Biopsy showed DCIS with apocrine features, calcifications, and necrosis, high grade, ER/PR negative.   07/08/2019 Surgery   Left lumpectomy [name]) [phone]): high grade DCIS, 1.5cm, clear margins.   07/08/2019 Cancer Staging   Staging form: Breast, AJCC 8th Edition - Pathologic stage from 07/08/2019: Stage 0 (pTis (DCIS), pN0, cM0, ER-, PR-)   08/09/2019 Genetic Testing   Negative genetic testing on the [facility] 9-gene STAT panel.  The STAT Breast cancer panel offered by [facility] includes sequencing and rearrangement analysis for the following 9 genes:  ATM, BRCA1, BRCA2, CDH1, CHEK2, PALB2, PTEN, STK11 and TP53.   The report date is August 09, 2019.   08/11/2019 - 09/01/2019 Radiation Therapy   The patient initially received a dose of 42.72 Gy in 16 fractions to the breast using whole-breast tangent fields. This was delivered using a 3-D conformal technique. No boost. The total dose was 42.72  Gy.     CHIEF COMPLIANT: Follow-up of left breast DCIS/ Scans  INTERVAL HISTORY: Kara [name] is a 77 y.o. with above-mentioned history of left breast DCIS who underwent a left lumpectomy, radiation, and is currently on surveillance. Pt. Reports abdominal pain. She says when she eats it. It aggravates it. She can also feel it when she is laying on it. Denies external pain, says it is all internal.   ALLERGIES:  is allergic to coconut (cocos nucifera), other, prednisone, iodinated contrast media, petrolatum, and naproxen.  MEDICATIONS:  Current Outpatient Medications  Medication Sig Dispense Refill   acetaminophen (TYLENOL) 500 MG tablet Take 500 mg by mouth every 8 (eight) hours as needed for moderate pain.     albuterol (VENTOLIN HFA) 108 (90 Base) MCG/ACT inhaler INHALE 1 TO 2 PUFFS INTO THE LUNGS EVERY 6 HOURS AS NEEDED FOR WHEEZING OR SHORTNESS OF BREATH 6.7 g 2   brimonidine (ALPHAGAN) 0.2 % ophthalmic solution Place 1 drop into both eyes 2 (two) times daily.     Carboxymethylcell-Glycerin PF (REFRESH RELIEVA PF) 0.5-1 % SOLN Place 1 drop into both eyes in the morning, at noon, in the evening, and at bedtime.     hydrochlorothiazide (HYDRODIURIL) 12.5 MG tablet Take 1 tablet (12.5 mg total) by mouth daily. (Patient taking differently: Take 12.5 mg by mouth daily in the afternoon.) 30 tablet 3   latanoprost (XALATAN) 0.005 % ophthalmic solution Place 1 drop into both eyes at bedtime.     Misc Natural Products (SAMBUCUS ELDERBERRY VITAMIN C MT) Take 1 each by mouth daily. Gummy     SYNTHROID 100 MCG tablet Take 1 tablet (100 mcg total) by mouth daily before breakfast.  90 tablet 1   Thiamine HCl (VITAMIN B-1 PO) Take 1 tablet by mouth in the morning.     No current facility-administered medications for this visit.    PHYSICAL EXAMINATION: ECOG PERFORMANCE STATUS: 1 - Symptomatic but completely ambulatory  Vitals:   12/07/22 1129  BP: (!) 151/81  Pulse: (!) 105  Resp: 18   Temp: 97.8 F (36.6 C)  SpO2: 97%   Filed Weights   12/07/22 1129  Weight: 221 lb 4.8 oz (100.4 kg)      LABORATORY DATA:  I have reviewed the data as listed    Latest Ref Rng & Units 11/28/2022    9:37 AM 10/13/2022    8:45 AM 08/17/2022   10:23 AM  CMP  Glucose 70 - 99 mg/dL  161  096   BUN 8 - 27 mg/dL  18  17   Creatinine 0.45 - 1.00 mg/dL  4.09  8.11   Sodium 914 - 144 mmol/L  138  142   Potassium 3.5 - 5.2 mmol/L  4.4  4.4   Chloride 96 - 106 mmol/L  106  106   CO2 20 - 29 mmol/L  20  20   Calcium 8.7 - 10.3 mg/dL  9.4  78.2   Total Protein 6.0 - 8.5 g/dL 6.7  6.5  7.1   Total Bilirubin 0.0 - 1.2 mg/dL 0.4  0.4  0.4   Alkaline Phos 44 - 121 IU/L 149  140  142   AST 0 - 40 IU/L 19  18  12    ALT 0 - 32 IU/L 17  18  12      Lab Results  Component Value Date   WBC 6.6 08/17/2022   HGB 14.2 08/17/2022   HCT 44.9 08/17/2022   MCV 81 08/17/2022   PLT 216 08/17/2022   NEUTROABS 3.4 08/17/2022    ASSESSMENT & PLAN:  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:Screening mammogram showed left breast calcifications. Diagnostic mammogram showed left breast calcifications spanning 1.1cm. Biopsy showed DCIS with apocrine features, calcifications, and necrosis, high grade, ER/PR negative.   07/08/2019:Left lumpectomy [name]): high grade DCIS, 1.5cm, clear margins. 09/01/2019: Completed adjuvant radiation   Current treatment: Surveillance Breast cancer surveillance: Mammogram 06/14/2022: Benign breast density category B  Abdominal pain with elevated LFTs: Follows with gastroenterology. EUS by Dr. Meridee Score on 11/23/2022: Benign CT CAP 12/07/2022: No evidence of metastatic disease.  She will follow-up with gastroenterology for further evaluation of the right upper quadrant abdominal discomfort and I will see her on an as-needed basis.    No orders of the defined types were placed in this encounter.  The patient has a good understanding of the overall plan. she  agrees with it. she will call with any problems that may develop before the next visit here. Total time spent: 30 mins including face to face time and time spent for planning, charting and co-ordination of care   Tamsen Meek, MD 12/07/22    I Janan Ridge am acting as a Neurosurgeon for [employer]  I have reviewed the above documentation for accuracy and completeness, and I agree with the above.

## 2022-12-07 ENCOUNTER — Inpatient Hospital Stay: Payer: Medicare Other | Attending: Hematology and Oncology | Admitting: Hematology and Oncology

## 2022-12-07 ENCOUNTER — Ambulatory Visit (HOSPITAL_COMMUNITY)
Admission: RE | Admit: 2022-12-07 | Discharge: 2022-12-07 | Disposition: A | Discharge status: Home / Self Care | Payer: Medicare Other | Source: Ambulatory Visit | Attending: Hematology and Oncology | Admitting: Hematology and Oncology

## 2022-12-07 ENCOUNTER — Other Ambulatory Visit: Payer: Self-pay

## 2022-12-07 VITALS — BP 151/81 | HR 105 | Temp 97.8°F | Resp 18 | Ht 66.0 in | Wt 221.3 lb

## 2022-12-07 DIAGNOSIS — Z923 Personal history of irradiation: Secondary | ICD-10-CM | POA: Insufficient documentation

## 2022-12-07 DIAGNOSIS — C50912 Malignant neoplasm of unspecified site of left female breast: Secondary | ICD-10-CM | POA: Diagnosis not present

## 2022-12-07 DIAGNOSIS — R7989 Other specified abnormal findings of blood chemistry: Secondary | ICD-10-CM | POA: Insufficient documentation

## 2022-12-07 DIAGNOSIS — Z86000 Personal history of in-situ neoplasm of breast: Secondary | ICD-10-CM | POA: Diagnosis not present

## 2022-12-07 DIAGNOSIS — D0512 Intraductal carcinoma in situ of left breast: Secondary | ICD-10-CM | POA: Insufficient documentation

## 2022-12-07 DIAGNOSIS — R109 Unspecified abdominal pain: Secondary | ICD-10-CM | POA: Insufficient documentation

## 2022-12-07 DIAGNOSIS — K7689 Other specified diseases of liver: Secondary | ICD-10-CM | POA: Diagnosis not present

## 2022-12-07 MED ORDER — IOHEXOL 300 MG/ML  SOLN
100.0000 mL | Freq: Once | INTRAMUSCULAR | Status: AC | PRN
  Administered 2022-12-07: 100 mL via INTRAVENOUS

## 2022-12-07 MED ORDER — SODIUM CHLORIDE (PF) 0.9 % IJ SOLN
INTRAMUSCULAR | Status: AC
  Filled 2022-12-07: qty 50

## 2022-12-07 NOTE — Assessment & Plan Note (Addendum)
06/11/2019:Screening mammogram showed left breast calcifications. Diagnostic mammogram showed left breast calcifications spanning 1.1cm. Biopsy showed DCIS with apocrine features, calcifications, and necrosis, high grade, ER/PR negative.   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 09/01/2019: Completed adjuvant radiation   Current treatment: Surveillance Breast cancer surveillance: Mammogram 06/14/2022: Benign breast density category B  Abdominal pain with elevated LFTs: Follows with gastroenterology. EUS by Dr. Meridee Score on 11/23/2022: Benign CT CAP 12/07/2022: No evidence of metastatic disease.  She will follow-up with gastroenterology for further evaluation of the right upper quadrant abdominal discomfort and I will see her on an as-needed basis.

## 2022-12-12 ENCOUNTER — Encounter: Payer: Self-pay | Admitting: *Deleted

## 2022-12-12 NOTE — Progress Notes (Signed)
[facility] PREP Weekly Session  Patient Details  Name: Kara [name] MRN: [id number] Date of Birth: 1946-03-19 Age: 77 y.o. PCP: Anabel Halon, MD  There were no vitals filed for this visit.   [facility] Weekly seesion - 12/12/22 1500       [facility] "PREP" Location   [facility] "PREP" Location [facility]      Weekly Session   Topic Discussed Goal setting and welcome to the program   Introductions, book review, perceived exertion scale, tour of facility   Classes attended to date 1             [name] 12/12/2022, 9:39 PM

## 2022-12-13 ENCOUNTER — Encounter: Payer: Self-pay | Admitting: Internal Medicine

## 2022-12-14 ENCOUNTER — Telehealth: Payer: Self-pay | Admitting: Cardiovascular Disease

## 2022-12-14 NOTE — Telephone Encounter (Signed)
Tried calling pt back. LVM to return call

## 2022-12-14 NOTE — Telephone Encounter (Signed)
  Per MyChart scheduling message:   ,Pt c/o swelling/edema: STAT if pt has developed SOB within 24 hours  If swelling, where is the swelling located?   How much weight have you gained and in what time span?   Have you gained 2 pounds in a day or 5 pounds in a week?   Do you have a log of your daily weights (if so, list)?   Are you currently taking a fluid pill?   Are you currently SOB?   Have you traveled recently in a car or plane for an extended period of time?   1. Swelling in feet an ankles. I wear compression stockings daily.    2&3  I've gained 6 lbs. when I lose fluid I loose 4-5 lbs.    4. No log    5. Taking Hydrochlorothiazide 12.5 mg daily   6. Don't know what SOB means   7. NO

## 2022-12-15 NOTE — Telephone Encounter (Signed)
Called pt to let her know that the provider stated she can take her hydrochlorothiazide every other day. Pt verbalized understanding.

## 2022-12-15 NOTE — Telephone Encounter (Signed)
Has there been an increased intake of sodium?  It is important to know whether she has shortness of breath.  She takes a week diuretic hydrochlorothiazide, but it does not look like she has prescription for furosemide.  If she is not short of breath, I would simply advise paying very close attention over the next several days to avoid salt rich foods, keeping her legs elevated over the weekend, let us know how she is doing early next week.  Would prefer not to give her furosemide or other potent diuretics if she is only mildly symptomatic.

## 2022-12-15 NOTE — Telephone Encounter (Signed)
Patient was returning call. Please advise ?

## 2022-12-15 NOTE — Telephone Encounter (Signed)
If she is lost all that fluid so quickly, it is perfectly reasonable for her to take the hydrochlorothiazide every other day.

## 2022-12-15 NOTE — Telephone Encounter (Signed)
Called pt back. No extra salt added and does not cook with salt. Pt feels winded at times but not short of breath at all. She states she has had swelling in her feet and ankles. Pt has lost 6lbs in the last 2 days. Feels funning when she takes the hydrochlorothiazide. Her BP was 99/70 yesterday. She is in the bathroom constantly all night. Did not take the med today because it just makes her feel strange. She is questioning on taking this everyday.

## 2022-12-20 ENCOUNTER — Encounter: Payer: Self-pay | Admitting: *Deleted

## 2022-12-20 NOTE — Progress Notes (Signed)
[facility] PREP Weekly Session  Patient Details  Name: Kara [name] MRN: [id number] Date of Birth: [birth year] Age: 77 y.o. PCP: Anabel Halon, MD  Vitals:   12/19/22 1330  Weight: 222 lb (100.7 kg)     [facility] Weekly seesion - 12/19/22 1500       [facility] "PREP" Location   [facility] "PREP" Location [facility]      Weekly Session   Topic Discussed Other ways to be active;Importance of resistance training   Review national standards for cardiovascular exercise: 150 minutes/week. Strength training:2-4 days/week (20-40 min sessions). Balance work and cardio machines   Minutes exercised this week 300 minutes    Classes attended to date 3             [name] 12/20/2022, 8:18 AM

## 2022-12-21 ENCOUNTER — Ambulatory Visit: Payer: Medicare Other | Admitting: Internal Medicine

## 2022-12-23 ENCOUNTER — Ambulatory Visit
Admission: EM | Admit: 2022-12-23 | Discharge: 2022-12-23 | Disposition: A | Discharge status: Home / Self Care | Payer: Medicare Other | Attending: Family Medicine | Admitting: Family Medicine

## 2022-12-23 DIAGNOSIS — R102 Pelvic and perineal pain: Secondary | ICD-10-CM | POA: Diagnosis not present

## 2022-12-23 DIAGNOSIS — R8290 Unspecified abnormal findings in urine: Secondary | ICD-10-CM | POA: Diagnosis not present

## 2022-12-23 LAB — POCT URINALYSIS DIP (MANUAL ENTRY)
Bilirubin, UA: NEGATIVE
Blood, UA: NEGATIVE
Glucose, UA: NEGATIVE mg/dL
Ketones, POC UA: NEGATIVE mg/dL
Leukocytes, UA: NEGATIVE
Nitrite, UA: NEGATIVE
Protein Ur, POC: NEGATIVE mg/dL
Spec Grav, UA: 1.02 (ref 1.010–1.025)
Urobilinogen, UA: 0.2 E.U./dL
pH, UA: 6.5 (ref 5.0–8.0)

## 2022-12-23 NOTE — ED Triage Notes (Signed)
Reports low abdominal pain, foul urine odor, and bladder pressure x 3 days.

## 2022-12-23 NOTE — ED Provider Notes (Signed)
[facility]    CSN: [id number] Arrival date & time: 12/23/22  1012      History   Chief Complaint No chief complaint on file.   HPI Kara [name] is a 77 y.o. female.   Patient presenting today with 3-day history of lower abdominal pressure, urine odor and bladder pressure.  Denies dysuria, hematuria, nausea vomiting or diarrhea, fevers, flank pain.  So far not tried anything over-the-counter for symptoms.    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  The STAT Breast cancer panel offered by [facility] includes sequencing and rearrangement analysis for the following 9 genes:  ATM, BRCA1, BRCA2, CDH1, CHEK2, PALB2, PTEN, STK11 and TP53.   The report date is August 09, 2019.   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   Perimenopausal vasomotor symptoms 02/11/2016   Personal history of radiation therapy 2021   completed left breast radiation in April 2021   Postmenopausal atrophic vaginitis 11/09/2015   Pre-diabetes    no meds    Prediabetes 08/30/2017   Sleep apnea [id number]   Thyroid disease    Grave's   Vaginal discharge 01/16/2019   Weight loss, unintentional 03/27/2019    Patient Active Problem List   Diagnosis Date Noted   Class 2 severe obesity due to excess calories with serious comorbidity and body mass index (BMI) of 35.0 to 35.9 in adult (HCC) 10/19/2022   Sleep apnea 07/21/2022   H/o Lyme disease 07/21/2022   Hypotension 07/03/2022   Idiopathic peripheral neuropathy 04/12/2022   RUQ pain 04/05/2022   Encounter for screening colonoscopy 04/05/2022   Leg swelling 05/25/2021   Vitamin D deficiency 03/11/2021   Plantar fasciitis of right foot 10/13/2020   Trigger middle finger of right hand 10/13/2020   Intermittent palpitations 04/29/2020   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 fibroids   BIOPSY  05/02/2022   Procedure: BIOPSY;  Surgeon: Lanelle Bal,  DO;  [facility];  Service: Endoscopy;;   BREAST BIOPSY Left 05/2019   left breast DCIS   BREAST LUMPECTOMY Left 06/2019   DCIS   BREAST LUMPECTOMY WITH RADIOACTIVE SEED LOCALIZATION Left 07/08/2019   Procedure: LEFT BREAST LUMPECTOMY WITH RADIOACTIVE SEED LOCALIZATION;  Surgeon: Griselda Miner, MD;  Location: [facility];  Service: General;  Laterality: Left;   BREAST SURGERY N/A    Phreesia 08/30/2020   CESAREAN SECTION     CESAREAN SECTION N/A    Phreesia 08/30/2020   CHOLECYSTECTOMY      COLONOSCOPY  2011   [place]: two 3-4 mm sessile polyps, hyperplastic, sigmoid diverticula, TI appeared normal.    COLONOSCOPY WITH PROPOFOL N/A 05/02/2022   Procedure: COLONOSCOPY WITH PROPOFOL;  Surgeon: Lanelle Bal, DO;  [facility];  Service: Endoscopy;  Laterality: N/A;  11:30 am   ESOPHAGOGASTRODUODENOSCOPY  2011   [place]: non-bleeding erosive gastropathy, normal duodenum. path: unremarkable duodenum, negative H.pylori, minimal esophagitis    ESOPHAGOGASTRODUODENOSCOPY N/A 11/23/2022   Procedure: ESOPHAGOGASTRODUODENOSCOPY (EGD);  Surgeon: Lemar Lofty., MD;  Location: [facility];  Service: Gastroenterology;  Laterality: N/A;   ESOPHAGOGASTRODUODENOSCOPY (EGD) WITH PROPOFOL N/A 05/02/2022   Procedure: ESOPHAGOGASTRODUODENOSCOPY (EGD) WITH PROPOFOL;  Surgeon: Lanelle Bal, DO;  [facility];  Service: Endoscopy;  Laterality: N/A;   EUS N/A 11/23/2022   Procedure: UPPER ENDOSCOPIC ULTRASOUND (EUS) RADIAL;  Surgeon: Lemar Lofty., MD;  Location: [facility];  Service: Gastroenterology;  Laterality: N/A;   EYE SURGERY  2022   POLYPECTOMY  05/02/2022   Procedure: POLYPECTOMY;  Surgeon: Lanelle Bal, DO;  [facility];  Service: Endoscopy;;   TONSILLECTOMY  1975    OB History     Gravida  3   Para  2   Term      Preterm      AB  1   Living         SAB  1   IAB      Ectopic      Multiple      Live Births               Home Medications    Prior to Admission medications   Medication Sig Start Date End Date Taking? Authorizing Provider  acetaminophen (TYLENOL) 500 MG tablet Take 500 mg by mouth every 8 (eight) hours as needed for moderate pain.    [provider]  albuterol (VENTOLIN HFA) 108 (90 Base) MCG/ACT inhaler INHALE 1 TO 2 PUFFS INTO THE LUNGS EVERY 6 HOURS AS NEEDED FOR WHEEZING OR SHORTNESS OF BREATH 10/14/21   Anabel Halon, MD  brimonidine (ALPHAGAN) 0.2 % ophthalmic  solution Place 1 drop into both eyes 2 (two) times daily. 06/07/22   [provider]  Carboxymethylcell-Glycerin PF (REFRESH RELIEVA PF) 0.5-1 % SOLN Place 1 drop into both eyes in the morning, at noon, in the evening, and at bedtime.    [provider]  hydrochlorothiazide (HYDRODIURIL) 12.5 MG tablet Take 1 tablet (12.5 mg total) by mouth daily. Patient taking differently: Take 12.5 mg by mouth every other day. 10/19/22   Anabel Halon, MD  latanoprost (XALATAN) 0.005 % ophthalmic solution Place 1 drop into both eyes at bedtime. 06/06/22   [provider]  Misc Natural Products (SAMBUCUS ELDERBERRY VITAMIN C MT) Take 1 each by mouth daily. Gummy    [provider]  SYNTHROID 100 MCG tablet Take 1 tablet (100  mcg total) by mouth daily before breakfast. 10/19/22   Nida, Denman George, MD  Thiamine HCl (VITAMIN B-1 PO) Take 1 tablet by mouth in the morning.    [provider]    Family History Family History  Problem Relation Age of Onset   Breast cancer Maternal Grandmother        dx over 75   Prostate cancer Maternal Grandfather    Arthritis Mother    COPD Mother    Breast cancer Mother 85       second at age 20   Hearing loss Mother    Cancer Father        throat   Stroke Father    Cancer Maternal Aunt 54       [name], cervical cancer   Breast cancer Maternal Aunt        dx over 58   Breast cancer Paternal Aunt        dx under 50   Prostate cancer Maternal Uncle        dx over 61   Cancer Other        father's maternal cousin was Henreietta Lacks, Cervical   Cancer Maternal Uncle        unknown cancer   Breast cancer Paternal Aunt        dx over 20   Colon cancer Neg Hx    Allergic rhinitis Neg Hx    Angioedema Neg Hx    Asthma Neg Hx    Atopy Neg Hx    Eczema Neg Hx    Urticaria Neg Hx    Immunodeficiency Neg Hx     Social History Social History   Tobacco Use   Smoking status: Former    Current packs/day: 0.00     Average packs/day: 0.5 packs/day for 15.0 years (7.5 ttl pk-yrs)    Types: Cigarettes    Start date: 06/16/1983    Quit date: 06/15/1998    Years since quitting: 24.5   Smokeless tobacco: Never   Tobacco comments:    quit 2002  Vaping Use   Vaping status: Never Used  Substance Use Topics   Alcohol use: No   Drug use: Never     Allergies   Coconut (cocos nucifera), Other, Prednisone, Iodinated contrast media, Petrolatum, and Naproxen   Review of Systems Review of Systems Per HPI  Physical Exam Triage Vital Signs ED Triage Vitals  Encounter Vitals Group     BP 12/23/22 1022 111/73     Systolic BP Percentile --      Diastolic BP Percentile --      Pulse Rate 12/23/22 1022 79     Resp 12/23/22 1022 18     Temp 12/23/22 1022 98 F (36.7 C)     Temp Source 12/23/22 1022 Oral     SpO2 12/23/22 1022 94 %     Weight --      Height --      Head Circumference --      Peak Flow --      Pain Score 12/23/22 1048 0     Pain Loc --      Pain Education --      Exclude from Growth Chart --    No data found.  Updated Vital Signs BP 111/73 (BP Location: Right Arm)   Pulse 79   Temp 98 F (36.7 C) (Oral)   Resp 18   SpO2 94%   Visual Acuity Right Eye Distance:  Left Eye Distance:   Bilateral Distance:    Right Eye Near:   Left Eye Near:    Bilateral Near:     Physical Exam Vitals and nursing note reviewed.  Constitutional:      Appearance: Normal appearance. She is not ill-appearing.  HENT:     Head: Atraumatic.  Eyes:     Extraocular Movements: Extraocular movements intact.     Conjunctiva/sclera: Conjunctivae normal.  Cardiovascular:     Rate and Rhythm: Normal rate and regular rhythm.     Heart sounds: Normal heart sounds.  Pulmonary:     Effort: Pulmonary effort is normal.     Breath sounds: Normal breath sounds.  Abdominal:     General: Bowel sounds are normal. There is no distension.     Palpations: Abdomen is soft.     Tenderness: There is no  abdominal tenderness. There is no right CVA tenderness, left CVA tenderness or guarding.  Genitourinary:    Comments: GU exam deferred, self swab performed Musculoskeletal:        General: Normal range of motion.     Cervical back: Normal range of motion and neck supple.  Skin:    General: Skin is warm and dry.  Neurological:     Mental Status: She is alert and oriented to person, place, and time.  Psychiatric:        Mood and Affect: Mood normal.        Thought Content: Thought content normal.        Judgment: Judgment normal.      UC Treatments / Results  Labs (all labs ordered are listed, but only abnormal results are displayed) Labs Reviewed  POCT URINALYSIS DIP (MANUAL ENTRY)  CERVICOVAGINAL ANCILLARY ONLY    EKG   Radiology No results found.  Procedures Procedures (including critical care time)  Medications Ordered in UC Medications - No data to display  Initial Impression / Assessment and Plan / UC Course  I have reviewed the triage vital signs and the nursing notes.  Pertinent labs & imaging results that were available during my care of the patient were reviewed by me and considered in my medical decision making (see chart for details).     Urinalysis without abnormality today, vaginal swab pending for further evaluation of symptoms.  Vitals and exam reassuring today.  Discussed supportive over-the-counter measures and return precautions.  Final Clinical Impressions(s) / UC Diagnoses   Final diagnoses:  Suprapubic pressure  Abnormal urine odor     Discharge Instructions      Your urinalysis today was normal-appearing.  We have sent out a vaginal swab for further evaluation.  Stay well-hydrated, and you may take probiotics to help maintain a healthy genitourinary tract. follow-up if symptoms worsen for recheck    ED Prescriptions   None    PDMP not reviewed this encounter.   Particia Nearing, [place] 12/23/22 1553

## 2022-12-23 NOTE — Discharge Instructions (Signed)
Your urinalysis today was normal-appearing.  We have sent out a vaginal swab for further evaluation.  Stay well-hydrated, and you may take probiotics to help maintain a healthy genitourinary tract. follow-up if symptoms worsen for recheck

## 2022-12-25 LAB — CERVICOVAGINAL ANCILLARY ONLY
Bacterial Vaginitis (gardnerella): NEGATIVE
Candida Glabrata: NEGATIVE
Candida Vaginitis: NEGATIVE
Comment: NEGATIVE
Comment: NEGATIVE
Comment: NEGATIVE

## 2022-12-26 ENCOUNTER — Encounter: Payer: Self-pay | Admitting: *Deleted

## 2022-12-26 NOTE — Progress Notes (Signed)
[facility] PRE[facility] Weekly Session  Patient Details  Name: JEYDY PACHUTA MRN: [id number] Date of Birth: Apr 12, 1946 Age: 77 y.o. PCP: Anabel Halon, MD  Vitals:   12/26/22 1330  Weight: 220 lb (99.8 kg)     [facility] Weekly seesion - 12/26/22 1500       [facility] "PREP" Location   [facility]PREP" Location [facility]      Weekly Session   Topic Discussed Healthy eating tips;Eating for the season   Discuss macronutrients, carbohydrates, fats and protiens. Added sugars 24gms/day for women, 36gms/day for men. Sodium, limit intake 1500-2300mg /day. [employer] App.   Minutes exercised this week 180 minutes    Classes attended to date 4             [name] 12/26/2022, 4:00 PM

## 2023-01-02 ENCOUNTER — Encounter: Payer: Self-pay | Admitting: Internal Medicine

## 2023-01-03 ENCOUNTER — Telehealth: Payer: Self-pay | Admitting: Internal Medicine

## 2023-01-03 ENCOUNTER — Encounter: Payer: Self-pay | Admitting: Internal Medicine

## 2023-01-03 ENCOUNTER — Telehealth (INDEPENDENT_AMBULATORY_CARE_PROVIDER_SITE_OTHER): Payer: Medicare Other | Admitting: Internal Medicine

## 2023-01-03 DIAGNOSIS — U071 COVID-19: Secondary | ICD-10-CM | POA: Diagnosis not present

## 2023-01-03 DIAGNOSIS — M7989 Other specified soft tissue disorders: Secondary | ICD-10-CM

## 2023-01-03 DIAGNOSIS — R0602 Shortness of breath: Secondary | ICD-10-CM | POA: Diagnosis not present

## 2023-01-03 MED ORDER — NIRMATRELVIR/RITONAVIR (PAXLOVID)TABLET
3.0000 | ORAL_TABLET | Freq: Two times a day (BID) | ORAL | 0 refills | Status: AC
Start: 2023-01-03 — End: 2023-01-08

## 2023-01-03 MED ORDER — ALBUTEROL SULFATE HFA 108 (90 BASE) MCG/ACT IN AERS
1.0000 | INHALATION_SPRAY | Freq: Four times a day (QID) | RESPIRATORY_TRACT | 2 refills | Status: DC | PRN
Start: 2023-01-03 — End: 2023-11-05

## 2023-01-03 NOTE — Telephone Encounter (Signed)
Patient called to make sure medicine will be sent into the [facility]

## 2023-01-03 NOTE — Assessment & Plan Note (Signed)
Started Paxlovid Robitussin as needed for cough Albuterol as needed for dyspnea or wheezing (chronic) Advised to hold HCTZ if she experiences dizziness - takes it for leg swelling

## 2023-01-03 NOTE — Telephone Encounter (Signed)
Rx sent to [facility]

## 2023-01-03 NOTE — Assessment & Plan Note (Addendum)
Had it during Post-COVID phase Albuterol PRN Also reports mild dyspnea with leg swelling, could be related to HFpEF although last echo showed indeterminate diastolic function - takes hydrochlorothiazide and feels better with leg swelling and dyspnea

## 2023-01-03 NOTE — Progress Notes (Signed)
Virtual Visit via Video Note   Because of Kara [name]'s co-morbid illnesses, she is at least at moderate risk for complications without adequate follow up.  This format is felt to be most appropriate for this patient at this time.  All issues noted in this document were discussed and addressed.  A limited physical exam was performed with this format.      Evaluation Performed:  Follow-up visit  Date:  01/03/2023   ID:  Kara [name], DOB [birth year], MRN [id number]  Patient Location: Home Provider Location: Office/Clinic  Participants: Patient Location of Patient: Home Location of Provider: Telehealth Consent was obtain for visit to be over via telehealth. I verified that I am speaking with the correct person using two identifiers.  PCP:  Anabel Halon, MD   Chief Complaint: Cough, nasal congestion and fever  History of Present Illness:    Kara [name] is a 77 y.o. female who has a video visit for complaint of cough, nasal congestion and fever for the last 2 days.  She tested positive for COVID yesterday.  She has chronic, intermittent dyspnea and uses albuterol inhaler as needed.  The patient does have symptoms concerning for COVID-19 infection (fever, chills, cough, or new shortness of breath).   Past Medical, Surgical, Social History, Allergies, and Medications have been Reviewed.  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  The STAT Breast cancer panel offered by [facility] includes sequencing and rearrangement analysis for the following 9 genes:  ATM, BRCA1, BRCA2, CDH1, CHEK2, PALB2, PTEN, STK11 and TP53.   The report date is August 09, 2019.   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   Perimenopausal vasomotor symptoms 02/11/2016   Personal history of radiation therapy 2021   completed left breast radiation in April 2021   Postmenopausal atrophic vaginitis 11/09/2015   Pre-diabetes    no meds   Prediabetes 08/30/2017   Sleep apnea [id number]   Thyroid disease    Grave's   Vaginal discharge 01/16/2019   Weight loss, unintentional 03/27/2019   Past Surgical History:  Procedure Laterality Date   ABDOMINAL HYSTERECTOMY     bleeding. fibroids   BIOPSY  05/02/2022   Procedure: BIOPSY;  Surgeon: Lanelle Bal, DO;  [facility];  Service: Endoscopy;;   BREAST BIOPSY Left 05/2019   left breast DCIS   BREAST LUMPECTOMY Left 06/2019   DCIS  BREAST LUMPECTOMY WITH RADIOACTIVE SEED LOCALIZATION Left 07/08/2019   Procedure: LEFT BREAST LUMPECTOMY WITH RADIOACTIVE SEED LOCALIZATION;  Surgeon: Griselda Miner, MD;  Location: [facility];  Service: General;  Laterality: Left;   BREAST SURGERY N/A    Phreesia 08/30/2020   CESAREAN SECTION     CESAREAN SECTION N/A    Phreesia 08/30/2020   CHOLECYSTECTOMY     COLONOSCOPY  2011   [place]: two 3-4 mm sessile polyps, hyperplastic, sigmoid diverticula, TI appeared normal.    COLONOSCOPY WITH PROPOFOL N/A 05/02/2022   Procedure: COLONOSCOPY  WITH PROPOFOL;  Surgeon: Lanelle Bal, DO;  [facility];  Service: Endoscopy;  Laterality: N/A;  11:30 am   ESOPHAGOGASTRODUODENOSCOPY  2011   [place]: non-bleeding erosive gastropathy, normal duodenum. path: unremarkable duodenum, negative H.pylori, minimal esophagitis    ESOPHAGOGASTRODUODENOSCOPY N/A 11/23/2022   Procedure: ESOPHAGOGASTRODUODENOSCOPY (EGD);  Surgeon: Lemar Lofty., MD;  Location: [facility];  Service: Gastroenterology;  Laterality: N/A;   ESOPHAGOGASTRODUODENOSCOPY (EGD) WITH PROPOFOL N/A 05/02/2022   Procedure: ESOPHAGOGASTRODUODENOSCOPY (EGD) WITH PROPOFOL;  Surgeon: Lanelle Bal, DO;  [facility];  Service: Endoscopy;  Laterality: N/A;   EUS N/A 11/23/2022   Procedure: UPPER ENDOSCOPIC ULTRASOUND (EUS) RADIAL;  Surgeon: Lemar Lofty., MD;  Location: [facility];  Service: Gastroenterology;  Laterality: N/A;   EYE SURGERY  2022   POLYPECTOMY  05/02/2022   Procedure: POLYPECTOMY;  Surgeon: Lanelle Bal, DO;  [facility];  Service: Endoscopy;;   TONSILLECTOMY  1975     Current Meds  Medication Sig   nirmatrelvir/ritonavir (PAXLOVID) 20 x 150 MG & 10 x 100MG  TABS Take 3 tablets by mouth 2 (two) times daily for 5 days. (Take nirmatrelvir 150 mg two tablets twice daily for 5 days and ritonavir 100 mg one tablet twice daily for 5 days) Patient GFR is >60.     Allergies:   Coconut (cocos nucifera), Other, Prednisone, Iodinated contrast media, Petrolatum, and Naproxen   ROS:   Please see the history of present illness.     All other systems reviewed and are negative.   Labs/Other Tests and Data Reviewed:    Recent Labs: 08/17/2022: Hemoglobin 14.2; Platelets 216 10/13/2022: BUN 18; Creatinine, Ser 0.84; Potassium 4.4; Sodium 138; TSH 0.288 11/28/2022: ALT 17   Recent Lipid Panel Lab Results  Component Value Date/Time   CHOL 191 10/13/2022 08:45 AM   TRIG 68 10/13/2022 08:45 AM   HDL 49  10/13/2022 08:45 AM   CHOLHDL 3.9 10/13/2022 08:45 AM   CHOLHDL 3.0 10/22/2019 09:58 AM   LDLCALC 129 (H) 10/13/2022 08:45 AM   LDLCALC 114 (H) 10/22/2019 09:58 AM    Wt Readings from Last 3 Encounters:  12/26/22 220 lb (99.8 kg)  12/19/22 222 lb (100.7 kg)  12/07/22 221 lb 4.8 oz (100.4 kg)     Objective:    Vital Signs:  There were no vitals taken for this visit.   VITAL SIGNS:  reviewed GEN:  no acute distress EYES:  sclerae anicteric, EOMI - Extraocular Movements Intact RESPIRATORY:  normal respiratory effort, symmetric expansion NEURO:  alert and oriented x 3, no obvious focal deficit PSYCH:  normal affect  ASSESSMENT & PLAN:    COVID-19 Started Paxlovid Robitussin as needed for cough Albuterol as needed for dyspnea or wheezing (chronic) Advised to hold HCTZ if she experiences dizziness - takes it for leg swelling  Shortness of breath Had it during Post-COVID phase Albuterol PRN Also reports mild  dyspnea with leg swelling, could be related to HFpEF although last echo showed indeterminate diastolic function - takes hydrochlorothiazide and feels better with leg swelling and dyspnea  I discussed the assessment and treatment plan with the patient. The patient was provided an opportunity to ask questions, and all were answered. The patient agreed with the plan and demonstrated an understanding of the instructions.   The patient was advised to call back or seek an in-person evaluation if the symptoms worsen or if the condition fails to improve as anticipated.  The above assessment and management plan was discussed with the patient. The patient verbalized understanding of and has agreed to the management plan.   Medication Adjustments/Labs and Tests Ordered: Current medicines are reviewed at length with the patient today.  Concerns regarding medicines are outlined above.   Tests Ordered: No orders of the defined types were placed in this encounter.   Medication  Changes: Meds ordered this encounter  Medications   albuterol (VENTOLIN HFA) 108 (90 Base) MCG/ACT inhaler    Sig: Inhale 1 puff into the lungs every 6 (six) hours as needed for wheezing or shortness of breath.    Dispense:  6.7 g    Refill:  2   nirmatrelvir/ritonavir (PAXLOVID) 20 x 150 MG & 10 x 100MG  TABS    Sig: Take 3 tablets by mouth 2 (two) times daily for 5 days. (Take nirmatrelvir 150 mg two tablets twice daily for 5 days and ritonavir 100 mg one tablet twice daily for 5 days) Patient GFR is >60.    Dispense:  30 tablet    Refill:  0     Note: This dictation was prepared with Dragon dictation along with smaller phrase technology. Similar sounding words can be transcribed inadequately or may not be corrected upon review. Any transcriptional errors that result from this process are unintentional.      Disposition:  Follow up  Signed, Anabel Halon, MD  01/03/2023 11:22 AM     [facility]

## 2023-01-04 ENCOUNTER — Encounter: Payer: Self-pay | Admitting: Internal Medicine

## 2023-01-08 DIAGNOSIS — H401221 Low-tension glaucoma, left eye, mild stage: Secondary | ICD-10-CM | POA: Diagnosis not present

## 2023-01-08 DIAGNOSIS — H40021 Open angle with borderline findings, high risk, right eye: Secondary | ICD-10-CM | POA: Diagnosis not present

## 2023-01-10 ENCOUNTER — Encounter: Payer: Self-pay | Admitting: *Deleted

## 2023-01-10 NOTE — Progress Notes (Signed)
[facility] PREP Weekly Session  Patient Details  Name: Kara [name] MRN: [id number] Date of Birth: 05-Jun-1945 Age: 77 y.o. PCP: Anabel Halon, MD  Vitals:   01/10/23 1330  Weight: 222 lb (100.7 kg)     [facility] Weekly seesion - 01/10/23 1500       [facility] "PREP" Location   [facility] Location [facility]      Weekly Session   Topic Discussed Restaurant Eating;Eating for the season   Discussed limiting sodium intake to 1500-2300mg /day. Tips to reduce sodium intake, salt demo. Reading labels on food products and what to look for.   Minutes exercised this week 60 minutes    Classes attended to date 22             [name] 01/10/2023, 8:59 PM

## 2023-01-16 ENCOUNTER — Encounter: Payer: Self-pay | Admitting: *Deleted

## 2023-01-16 DIAGNOSIS — E890 Postprocedural hypothyroidism: Secondary | ICD-10-CM | POA: Diagnosis not present

## 2023-01-16 DIAGNOSIS — E782 Mixed hyperlipidemia: Secondary | ICD-10-CM | POA: Diagnosis not present

## 2023-01-16 NOTE — Progress Notes (Signed)
[facility] PREP Weekly Session  Patient Details  Name: Kara [name] MRN: [id number] Date of Birth: 11/12/1945 Age: 77 y.o. PCP: Anabel Halon, MD  Vitals:   01/16/23 1330  Weight: 220 lb (99.8 kg)     [facility] Weekly seesion - 01/16/23 1500       [facility] "PREP" Location   [facility]      Weekly Session   Topic Discussed Stress management and problem solving   Importance of sleep, tips for improving sleep, introduction to meditation, assign revisit goals page in preparation for half way through the program.   Minutes exercised this week 90 minutes    Classes attended to date 8             [name] 01/16/2023, 9:06 PM

## 2023-01-17 ENCOUNTER — Encounter: Payer: Self-pay | Admitting: "Endocrinology

## 2023-01-18 ENCOUNTER — Other Ambulatory Visit: Payer: Self-pay | Admitting: "Endocrinology

## 2023-01-18 MED ORDER — SYNTHROID 88 MCG PO TABS: 88.0000 ug | ORAL_TABLET | Freq: Every day | ORAL | 1 refills | Status: DC

## 2023-01-19 DIAGNOSIS — D0512 Intraductal carcinoma in situ of left breast: Secondary | ICD-10-CM | POA: Diagnosis not present

## 2023-01-22 ENCOUNTER — Ambulatory Visit (INDEPENDENT_AMBULATORY_CARE_PROVIDER_SITE_OTHER): Payer: Medicare Other | Admitting: Internal Medicine

## 2023-01-22 ENCOUNTER — Encounter: Payer: Self-pay | Admitting: Internal Medicine

## 2023-01-22 VITALS — BP 130/80 | HR 84 | Ht 66.0 in | Wt 221.0 lb

## 2023-01-22 DIAGNOSIS — E782 Mixed hyperlipidemia: Secondary | ICD-10-CM | POA: Diagnosis not present

## 2023-01-22 DIAGNOSIS — M7989 Other specified soft tissue disorders: Secondary | ICD-10-CM | POA: Diagnosis not present

## 2023-01-22 DIAGNOSIS — G4733 Obstructive sleep apnea (adult) (pediatric): Secondary | ICD-10-CM

## 2023-01-22 DIAGNOSIS — Z23 Encounter for immunization: Secondary | ICD-10-CM | POA: Diagnosis not present

## 2023-01-22 DIAGNOSIS — R7303 Prediabetes: Secondary | ICD-10-CM

## 2023-01-22 DIAGNOSIS — K219 Gastro-esophageal reflux disease without esophagitis: Secondary | ICD-10-CM | POA: Diagnosis not present

## 2023-01-22 DIAGNOSIS — E890 Postprocedural hypothyroidism: Secondary | ICD-10-CM | POA: Diagnosis not present

## 2023-01-22 MED ORDER — MISC. DEVICES MISC: 0 refills | Status: DC

## 2023-01-22 MED ORDER — MISC. DEVICES MISC
0 refills | Status: AC
Start: 2023-01-22 — End: ?

## 2023-01-22 NOTE — Patient Instructions (Signed)
Please continue to take medications as prescribed.  Please continue to follow low salt diet and ambulate as tolerated.  Please contact insurance to check preferred DME agency and let us know their name and fax number.

## 2023-01-22 NOTE — Assessment & Plan Note (Signed)
Continue to follow low cholesterol diet for now

## 2023-01-22 NOTE — Assessment & Plan Note (Addendum)
Likely related to venous insufficiency Leg elevation and compression socks Referred to PT, but had groin pain Uses compression device at home, needs to use it regularly Has improved with hydrochlorothiazide 12.5 mg QOD

## 2023-01-22 NOTE — Assessment & Plan Note (Signed)
Lab Results  Component Value Date   TSH 0.454 01/16/2023   On Levothyroxine, f/u with Dr Fransico Him

## 2023-01-22 NOTE — Assessment & Plan Note (Addendum)
Well controlled with diet modification now - was on Pantoprazole

## 2023-01-22 NOTE — Progress Notes (Signed)
Established Patient Office Visit  Subjective:  Patient ID: Kara [name], female    DOB: 1946/01/26  Age: 77 y.o. MRN: [id number]  CC:  Chief Complaint  Patient presents with   Sleep Apnea    Follow up    Gastroesophageal Reflux    Follow up     HPI Kara [name] is a 77 y.o. female with past medical history of GERD and hypothyroidism who presents for f/u of her chronic medical conditions.  She has been taking Synthroid regularly for her hypothyroidism.  Denies any recent change in weight or appetite.  She was advised to get sleep study by optometrist.  She is unsure of snoring at nighttime.  She has daytime fatigue, chronic.  Denies known episode of apnea. Sleep study showed moderate OSA, but has not been able to get CPAP device due to insurance coverage concerns.  She still complains of persistent leg swelling, but is better with HCTZ every other day. She had dizziness with Lasix, likely due to orthostatic hypotension.  She has tried leg elevation and compression socks without much relief.  She uses lymphedema pump with relief of leg swelling.  She denies orthopnea or PND currently.  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  The STAT Breast cancer panel offered by [facility] includes sequencing and rearrangement analysis for the following 9 genes:  ATM, BRCA1, BRCA2, CDH1, CHEK2, PALB2, PTEN, STK11 and TP53.   The report  date is August 09, 2019.   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 fibroids   BIOPSY  05/02/2022   Procedure: BIOPSY;  Surgeon: Lanelle Bal, DO;  Location: [facility];  Service: Endoscopy;;   BREAST BIOPSY Left 05/2019   left breast DCIS   BREAST LUMPECTOMY Left 06/2019   DCIS   BREAST LUMPECTOMY WITH RADIOACTIVE SEED LOCALIZATION Left 07/08/2019   Procedure: LEFT BREAST LUMPECTOMY WITH RADIOACTIVE SEED LOCALIZATION;  Surgeon: Griselda Miner, MD;  Location: [facility];  Service: General;  Laterality: Left;   BREAST SURGERY N/A    Phreesia 08/30/2020   CESAREAN SECTION     CESAREAN SECTION N/A    Phreesia 08/30/2020   CHOLECYSTECTOMY     COLONOSCOPY  2011   [place]: two 3-4 mm sessile polyps, hyperplastic, sigmoid diverticula, TI appeared normal.    COLONOSCOPY WITH PROPOFOL N/A 05/02/2022   Procedure: COLONOSCOPY WITH PROPOFOL;  Surgeon: Lanelle Bal, DO;  Location: [facility];  Service: Endoscopy;  Laterality: N/A;  11:30 am   ESOPHAGOGASTRODUODENOSCOPY  2011   [place]: non-bleeding erosive gastropathy, normal duodenum.  path: unremarkable duodenum, negative H.pylori, minimal esophagitis    ESOPHAGOGASTRODUODENOSCOPY N/A 11/23/2022   Procedure: ESOPHAGOGASTRODUODENOSCOPY (EGD);  Surgeon: Lemar Lofty., MD;  Location: [facility];  Service: Gastroenterology;  Laterality: N/A;   ESOPHAGOGASTRODUODENOSCOPY (EGD) WITH PROPOFOL N/A 05/02/2022   Procedure: ESOPHAGOGASTRODUODENOSCOPY (EGD) WITH PROPOFOL;  Surgeon: Lanelle Bal, DO;  Location: [facility];  Service: Endoscopy;  Laterality: N/A;   EUS N/A 11/23/2022   Procedure: UPPER ENDOSCOPIC ULTRASOUND (EUS) RADIAL;  Surgeon: Lemar Lofty., MD;  Location: [facility];  Service: Gastroenterology;  Laterality: N/A;   EYE SURGERY  2022   POLYPECTOMY  05/02/2022   Procedure: POLYPECTOMY;  Surgeon: Lanelle Bal, DO;  Location: [facility];  Service: Endoscopy;;   REFRACTIVE SURGERY  2024   TONSILLECTOMY  1975    Family History  Problem Relation Age of Onset   Breast cancer Maternal Grandmother        dx over 77   Prostate cancer Maternal Grandfather    Arthritis Mother    COPD Mother    Breast cancer Mother 47       second at age 55   Hearing loss Mother    Cancer Father        throat   Stroke Father    Cancer Maternal Aunt 69       [name], cervical cancer   Breast cancer Maternal Aunt        dx over 71   Breast cancer Paternal Aunt        dx under 50   Prostate cancer Maternal Uncle        dx over 77   Cancer Other        father's maternal cousin was Henreietta Lacks, Cervical   Cancer Maternal Uncle        unknown cancer   Breast cancer Paternal Aunt        dx over 8   Colon cancer Neg Hx    Allergic rhinitis Neg Hx    Angioedema Neg Hx    Asthma Neg Hx    Atopy Neg Hx    Eczema Neg Hx    Urticaria Neg Hx    Immunodeficiency Neg Hx     Social History   Socioeconomic History   Marital status: Divorced    Spouse name: Not on file   Number of children: 2   Years of education: 15   Highest  education level: Not on file  Occupational History   Occupation: retired    Comment: Loss adjuster, chartered at a Database administrator  Tobacco Use   Smoking status: Former    Current packs/day: 0.00    Average packs/day: 0.5 packs/day for 15.0 years (7.5 ttl pk-yrs)    Types: Cigarettes    Start date: 06/16/1983    Quit date: 06/15/1998    Years since quitting:  24.6   Smokeless tobacco: Never   Tobacco comments:    quit 2002  Vaping Use   Vaping status: Never Used  Substance and Sexual Activity   Alcohol use: No   Drug use: Never   Sexual activity: Not Currently    Birth control/protection: Surgical, None    Comment: hyst  Other Topics Concern   Not on file  Social History Narrative   Lives alone   2 grown children- one in [place] and one here   Grandchildren       Moved to [place] from [place] for aging mother who is 16 with Alzheimers      Maternal Aunt is Animator of the [employer] cancer call line      Enjoy: gym, time with friends       Diet: eats all food groups   Caffeine: 2 diet cokes daily    Water: 2-4 cups daily       Wears seat belt    Does not use phone while driving    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   [employer]    No weapons       Right Handed    Lives alone in a one story home    Retired   International aid/development worker of Health   Financial Resource Strain: Low Risk  (11/22/2022)   Overall Financial Resource Strain (CARDIA)    Difficulty of Paying Living Expenses: Not hard at all  Food Insecurity: No Food Insecurity (11/22/2022)   Hunger Vital Sign    Worried About Running Out of Food in the Last Year: Never true    Ran Out of Food in the Last Year: Never true  Transportation Needs: No Transportation Needs (11/22/2022)   PRAPARE - Administrator, Civil Service (Medical): No    Lack of Transportation (Non-Medical): No  Physical Activity: Sufficiently Active (11/22/2022)   Exercise Vital Sign    Days of Exercise per Week: 7  days    Minutes of Exercise per Session: 60 min  Stress: No Stress Concern Present (11/22/2022)   [employer] of Occupational Health - Occupational Stress Questionnaire    Feeling of Stress : Not at all  Social Connections: Moderately Isolated (11/22/2022)   Social Connection and Isolation Panel [NHANES]    Frequency of Communication with Friends and Family: More than three times a week    Frequency of Social Gatherings with Friends and Family: More than three times a week    Attends Religious Services: Never    Database administrator or Organizations: Yes    Attends Banker Meetings: 1 to 4 times per year    Marital Status: Divorced  Catering manager Violence: Not At Risk (11/22/2022)   Humiliation, Afraid, Rape, and Kick questionnaire    Fear of Current or Ex-Partner: No    Emotionally Abused: No    Physically Abused: No    Sexually Abused: No    Outpatient Medications Prior to Visit  Medication Sig Dispense Refill   acetaminophen (TYLENOL) 500 MG tablet Take 500 mg by mouth every 8 (eight) hours as needed for moderate pain.     albuterol (VENTOLIN HFA) 108 (90 Base) MCG/ACT inhaler Inhale 1 puff into the lungs every 6 (six) hours as needed for wheezing or shortness of breath. 6.7 g 2   brimonidine (ALPHAGAN) 0.2 % ophthalmic solution Place 1 drop into both eyes 2 (two) times daily.     Carboxymethylcell-Glycerin PF (REFRESH  RELIEVA PF) 0.5-1 % SOLN Place 1 drop into both eyes in the morning, at noon, in the evening, and at bedtime.     hydrochlorothiazide (HYDRODIURIL) 12.5 MG tablet Take 1 tablet (12.5 mg total) by mouth daily. (Patient taking differently: Take 12.5 mg by mouth every other day.) 30 tablet 3   latanoprost (XALATAN) 0.005 % ophthalmic solution Place 1 drop into both eyes at bedtime.     Misc Natural Products (SAMBUCUS ELDERBERRY VITAMIN C MT) Take 1 each by mouth daily. Gummy     Thiamine HCl (VITAMIN B-1 PO) Take 1 tablet by mouth in the morning.      SYNTHROID 88 MCG tablet Take 1 tablet (88 mcg total) by mouth daily before breakfast. 90 tablet 1   No facility-administered medications prior to visit.    Allergies  Allergen Reactions   Coconut (Cocos Nucifera) Anaphylaxis   Other Shortness Of Breath    Covid booster pfizer     Prednisone Hypertension    Severe headache   Iodinated Contrast Media Swelling    Pt reported throat swelling post-procedure that resolved with Benadryl at home   Petrolatum Dermatitis    Caused incision irritation    Naproxen Other (See Comments)    headache    ROS Review of Systems  Constitutional:  Positive for fatigue. Negative for chills and fever.  HENT:  Negative for congestion, sinus pressure, sinus pain and sore throat.   Eyes:  Negative for pain and discharge.  Respiratory:  Negative for cough and shortness of breath.   Cardiovascular:  Positive for leg swelling. Negative for chest pain and palpitations.  Gastrointestinal:  Negative for abdominal pain, constipation, diarrhea, nausea and vomiting.  Endocrine: Negative for polydipsia and polyuria.  Genitourinary:  Negative for dysuria and hematuria.  Musculoskeletal:  Positive for arthralgias and back pain. Negative for neck pain and neck stiffness.  Skin:  Negative for rash.  Neurological:  Positive for numbness (and tingling of LE). Negative for dizziness and weakness.  Psychiatric/Behavioral:  Negative for agitation and behavioral problems.       Objective:    Physical Exam Vitals reviewed.  Constitutional:      General: She is not in acute distress.    Appearance: She is obese. She is not diaphoretic.  HENT:     Head: Normocephalic and atraumatic.     Nose: Nose normal. No congestion.     Mouth/Throat:     Mouth: Mucous membranes are moist.     Pharynx: No posterior oropharyngeal erythema.  Eyes:     General: No scleral icterus.    Extraocular Movements: Extraocular movements intact.  Cardiovascular:     Rate and Rhythm:  Normal rate and regular rhythm.     Pulses: Normal pulses.     Heart sounds: Normal heart sounds. No murmur heard. Pulmonary:     Breath sounds: Normal breath sounds. No wheezing or rales.  Musculoskeletal:     Cervical back: Neck supple. No tenderness.     Right lower leg: Edema (1+) present.     Left lower leg: Edema (1+) present.  Skin:    General: Skin is warm.     Findings: No rash.  Neurological:     General: No focal deficit present.     Mental Status: She is alert and oriented to person, place, and time.     Sensory: Sensory deficit (B/l LE) present.     Motor: No weakness.  Psychiatric:        Mood  and Affect: Mood normal.        Behavior: Behavior normal.     BP 130/80 (BP Location: Left Arm, Patient Position: Sitting, Cuff Size: Large)   Pulse 84   Ht 5\' 6"  (1.676 m)   Wt 221 lb (100.2 kg)   SpO2 96%   BMI 35.67 kg/m  Wt Readings from Last 3 Encounters:  01/24/23 221 lb (100.2 kg)  01/23/23 222 lb (100.7 kg)  01/22/23 221 lb (100.2 kg)    Lab Results  Component Value Date   TSH 0.454 01/16/2023   Lab Results  Component Value Date   WBC 6.6 08/17/2022   HGB 14.2 08/17/2022   HCT 44.9 08/17/2022   MCV 81 08/17/2022   PLT 216 08/17/2022   Lab Results  Component Value Date   NA 139 01/16/2023   K 4.2 01/16/2023   CO2 23 01/16/2023   GLUCOSE 114 (H) 01/16/2023   BUN 16 01/16/2023   CREATININE 0.82 01/16/2023   BILITOT 0.4 01/16/2023   ALKPHOS 159 (H) 01/16/2023   AST 23 01/16/2023   ALT 23 01/16/2023   PROT 6.8 01/16/2023   ALBUMIN 3.9 01/16/2023   CALCIUM 10.0 01/16/2023   ANIONGAP 6 04/25/2022   EGFR 75 01/16/2023   Lab Results  Component Value Date   CHOL 222 (H) 01/16/2023   Lab Results  Component Value Date   HDL 50 01/16/2023   Lab Results  Component Value Date   LDLCALC 157 (H) 01/16/2023   Lab Results  Component Value Date   TRIG 85 01/16/2023   Lab Results  Component Value Date   CHOLHDL 4.4 01/16/2023   Lab Results   Component Value Date   HGBA1C 6.2 01/24/2023      Assessment & Plan:   Problem List Items Addressed This Visit       Respiratory   OSA (obstructive sleep apnea)    STOP-BANG: 5 Home sleep study showed moderate OSA Have tried to order CPAP through 2 different DMEs, but she was told that insurance does not have enough information - multiple attempts made to send the office notes and sleep study so far  Advised to [employer] and ask about preferred DME      Relevant Orders   For home use only DME continuous positive airway pressure (CPAP)     Digestive   GERD (gastroesophageal reflux disease)    Well controlled with diet modification now - was on Pantoprazole        Endocrine   Hypothyroidism following radioiodine therapy - Primary    Lab Results  Component Value Date   TSH 0.454 01/16/2023   On Levothyroxine, f/u with Dr Fransico Him        Other   Mixed hyperlipidemia    Continue to follow low cholesterol diet for now      Prediabetes    Lab Results  Component Value Date   HGBA1C 6.2 01/24/2023   Advised to follow low carb diet for now      Leg swelling    Likely related to venous insufficiency Leg elevation and compression socks Referred to PT, but had groin pain Uses compression device at home, needs to use it regularly Has improved with hydrochlorothiazide 12.5 mg QOD      Relevant Medications   Misc. Devices MISC   Other Visit Diagnoses     Encounter for immunization       Relevant Orders   Flu Vaccine Trivalent High Dose (Fluad) (Completed)  Meds ordered this encounter  Medications   DISCONTD: Misc. Devices MISC    Sig: Compression stockings XL: 20-30 mm Hg    Dispense:  1 each    Refill:  0   Misc. Devices MISC    Sig: Compression stockings XL: 20-30 mm Hg    Dispense:  1 each    Refill:  0    Follow-up: Return in about 6 months (around 07/22/2023).    Anabel Halon, MD

## 2023-01-23 ENCOUNTER — Encounter: Payer: Self-pay | Admitting: *Deleted

## 2023-01-23 NOTE — Progress Notes (Signed)
[facility] PREP Weekly Session  Patient Details  Name: Kara [name] MRN: [id number] Date of Birth: [birth year] Age: 77 y.o. PCP: Anabel Halon, MD  Vitals:   01/23/23 1330  Weight: 222 lb (100.7 kg)     [facility] Weekly seesion - 01/23/23 1500       [facility] "PREP" Location   [facility] "PREP" Location [facility]      Weekly Session   Topic Discussed Expectations and non-scale victories   Staying motivated, assessing progress toward goals, staying positive. Discussed Blue Zones and their commonalities in healthy living.   Minutes exercised this week 450 minutes    Classes attended to date 74             [name] 01/23/2023, 9:16 PM

## 2023-01-24 ENCOUNTER — Encounter: Payer: Self-pay | Admitting: "Endocrinology

## 2023-01-24 ENCOUNTER — Telehealth: Payer: Self-pay | Admitting: "Endocrinology

## 2023-01-24 ENCOUNTER — Ambulatory Visit (INDEPENDENT_AMBULATORY_CARE_PROVIDER_SITE_OTHER): Payer: Medicare Other | Admitting: "Endocrinology

## 2023-01-24 VITALS — BP 112/66 | HR 64 | Ht 66.0 in | Wt 221.0 lb

## 2023-01-24 DIAGNOSIS — E782 Mixed hyperlipidemia: Secondary | ICD-10-CM

## 2023-01-24 DIAGNOSIS — E890 Postprocedural hypothyroidism: Secondary | ICD-10-CM

## 2023-01-24 DIAGNOSIS — R7303 Prediabetes: Secondary | ICD-10-CM | POA: Diagnosis not present

## 2023-01-24 LAB — POCT GLYCOSYLATED HEMOGLOBIN (HGB A1C): HbA1c, POC (controlled diabetic range): 6.2 % (ref 0.0–7.0)

## 2023-01-24 MED ORDER — SYNTHROID 88 MCG PO TABS: 88.0000 ug | ORAL_TABLET | Freq: Every day | ORAL | 1 refills | Status: DC

## 2023-01-24 MED ORDER — SYNTHROID 75 MCG PO TABS: 75.0000 ug | ORAL_TABLET | Freq: Every day | ORAL | 1 refills | Status: DC

## 2023-01-24 NOTE — Telephone Encounter (Signed)
Pt called after hours line during lunch and left a message stating she had questions about the rx she was given today

## 2023-01-24 NOTE — Telephone Encounter (Signed)
Pt aware.

## 2023-01-24 NOTE — Telephone Encounter (Signed)
Pt switched from to last week. Does she need to go down to ?

## 2023-01-24 NOTE — Progress Notes (Signed)
01/24/2023, 4:30 PM                         Endocrinology follow-up note   Kara [name] is a 77 y.o.-year-old female patient being seen in follow-up for management of  RAI induced hypothyroidism referred by Anabel Halon, MD.   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  The STAT Breast cancer panel offered by [facility] includes sequencing and rearrangement analysis for the following 9 genes:  ATM, BRCA1, BRCA2, CDH1, CHEK2, PALB2, PTEN, STK11 and TP53.   The report date is August 09, 2019.   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 fibroids   BIOPSY  05/02/2022   Procedure: BIOPSY;  Surgeon: Lanelle Bal, DO;  [facility];  Service: Endoscopy;;   BREAST BIOPSY Left 05/2019   left breast DCIS   BREAST LUMPECTOMY Left 06/2019   DCIS   BREAST LUMPECTOMY WITH RADIOACTIVE SEED LOCALIZATION Left 07/08/2019   Procedure: LEFT BREAST LUMPECTOMY WITH RADIOACTIVE SEED LOCALIZATION;  Surgeon: Griselda Miner, MD;  Location: [facility];  Service: General;  Laterality: Left;   BREAST SURGERY N/A    Phreesia 08/30/2020   CESAREAN SECTION     CESAREAN SECTION N/A  [employer]reesia 08/30/2020   CHOLECYSTECTOMY     COLONOSCOPY  2011   [place]: two 3-4 mm sessile polyps, hyperplastic, sigmoid diverticula, TI appeared normal.    COLONOSCOPY WITH PROPOFOL N/A 05/02/2022   Procedure: COLONOSCOPY WITH PROPOFOL;  Surgeon: Lanelle Bal, DO;  [facility];  Service: Endoscopy;  Laterality: N/A;  11:30 am   ESOPHAGOGASTRODUODENOSCOPY  2011   [place]: non-bleeding erosive gastropathy, normal duodenum. path: unremarkable duodenum, negative H.pylori, minimal esophagitis    ESOPHAGOGASTRODUODENOSCOPY N/A 11/23/2022   Procedure: ESOPHAGOGASTRODUODENOSCOPY (EGD);  Surgeon: Lemar Lofty., MD;  Location: [facility];  Service: Gastroenterology;  Laterality: N/A;   ESOPHAGOGASTRODUODENOSCOPY (EGD) WITH PROPOFOL N/A 05/02/2022   Procedure: ESOPHAGOGASTRODUODENOSCOPY (EGD) WITH PROPOFOL;  Surgeon: Lanelle Bal, DO;  [facility];  Service: Endoscopy;  Laterality: N/A;   EUS N/A 11/23/2022   Procedure: UPPER ENDOSCOPIC ULTRASOUND (EUS) RADIAL;  Surgeon: Lemar Lofty., MD;  Location: [facility];  Service: Gastroenterology;  Laterality: N/A;   EYE SURGERY  2022   POLYPECTOMY  05/02/2022   Procedure: POLYPECTOMY;  Surgeon:  Lanelle Bal, DO;  [facility]: [facility];  Service: Endoscopy;;   REFRACTIVE SURGERY  2024   TONSILLECTOMY  1975    Social History   Socioeconomic History   Marital status: Divorced    Spouse name: Not on file   Number of children: 2   Years of education: 15   Highest education level: Not on file  Occupational History   Occupation: retired    Comment: Loss adjuster, chartered at a Database administrator  Tobacco Use   Smoking status: Former    Current packs/day: 0.00    Average packs/day: 0.5 packs/day for 15.0 years (7.5 ttl pk-yrs)    Types: Cigarettes    Start date: 06/16/1983    Quit date: 06/15/1998    Years since quitting: 24.6   Smokeless tobacco: Never   Tobacco comments:    quit 2002  Vaping Use   Vaping status: Never Used  Substance and Sexual Activity   Alcohol use: No   Drug use: Never   Sexual activity: Not Currently    Birth control/protection: Surgical, None    Comment: hyst  Other Topics Concern   Not on file  Social History Narrative   Lives alone   2 grown children- one in [place] and one here   Grandchildren       Moved to [place] from [place] for aging mother who is 62 with Alzheimers      Maternal Aunt is Animator of the [employer] cancer call line      Enjoy: gym, time with friends       Diet: eats all food groups   Caffeine: 2 diet cokes daily    Water: 2-4 cups daily       Wears seat belt    Does not use phone while driving    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   [employer]    No weapons       Right Handed    Lives alone in a one story home    Retired   International aid/development worker of Health   Financial Resource Strain: Low Risk  (11/22/2022)   Overall Financial Resource Strain (CARDIA)    Difficulty of Paying Living Expenses: Not hard at all  Food Insecurity: No Food Insecurity (11/22/2022)   Hunger Vital Sign    Worried About Running Out of Food in the Last Year: Never true  Ran Out of Food in the Last Year: Never  true  Transportation Needs: No Transportation Needs (11/22/2022)   PRAPARE - Administrator, Civil Service (Medical): No    Lack of Transportation (Non-Medical): No  Physical Activity: Sufficiently Active (11/22/2022)   Exercise Vital Sign    Days of Exercise per Week: 7 days    Minutes of Exercise per Session: 60 min  Stress: No Stress Concern Present (11/22/2022)   [employer] of Occupational Health - Occupational Stress Questionnaire    Feeling of Stress : Not at all  Social Connections: Moderately Isolated (11/22/2022)   Social Connection and Isolation Panel [NHANES]    Frequency of Communication with Friends and Family: More than three times a week    Frequency of Social Gatherings with Friends and Family: More than three times a week    Attends Religious Services: Never    Database administrator or Organizations: Yes    Attends Engineer, structural: 1 to 4 times per year    Marital Status: Divorced    Family History  Problem Relation Age of Onset   Breast cancer Maternal Grandmother        dx over 74   Prostate cancer Maternal Grandfather    Arthritis Mother    COPD Mother    Breast cancer Mother 13       second at age 58   Hearing loss Mother    Cancer Father        throat   Stroke Father    Cancer Maternal Aunt 34       [name], cervical cancer   Breast cancer Maternal Aunt        dx over 72   Breast cancer Paternal Aunt        dx under 50   Prostate cancer Maternal Uncle        dx over 67   Cancer Other        father's maternal cousin was Henreietta Lacks, Cervical   Cancer Maternal Uncle        unknown cancer   Breast cancer Paternal Aunt        dx over 17   Colon cancer Neg Hx    Allergic rhinitis Neg Hx    Angioedema Neg Hx    Asthma Neg Hx    Atopy Neg Hx    Eczema Neg Hx    Urticaria Neg Hx    Immunodeficiency Neg Hx     Outpatient Encounter Medications as of 01/24/2023  Medication Sig   SYNTHROID 88 MCG tablet  Take 1 tablet (88 mcg total) by mouth daily before breakfast.   [DISCONTINUED] SYNTHROID 75 MCG tablet Take 1 tablet (75 mcg total) by mouth daily before breakfast.   acetaminophen (TYLENOL) 500 MG tablet Take 500 mg by mouth every 8 (eight) hours as needed for moderate pain.   albuterol (VENTOLIN HFA) 108 (90 Base) MCG/ACT inhaler Inhale 1 puff into the lungs every 6 (six) hours as needed for wheezing or shortness of breath.   brimonidine (ALPHAGAN) 0.2 % ophthalmic solution Place 1 drop into both eyes 2 (two) times daily.   Carboxymethylcell-Glycerin PF (REFRESH RELIEVA PF) 0.5-1 % SOLN Place 1 drop into both eyes in the morning, at noon, in the evening, and at bedtime.   hydrochlorothiazide (HYDRODIURIL) 12.5 MG tablet Take 1 tablet (12.5 mg total) by mouth daily. (Patient taking differently: Take 12.5 mg by mouth every other day.)   latanoprost (XALATAN)  0.005 % ophthalmic solution Place 1 drop into both eyes at bedtime.   Misc Natural Products (SAMBUCUS ELDERBERRY VITAMIN C MT) Take 1 each by mouth daily. Gummy   Misc. Devices MISC Compression stockings XL: 20-30 mm Hg   Thiamine HCl (VITAMIN B-1 PO) Take 1 tablet by mouth in the morning.   [DISCONTINUED] SYNTHROID 88 MCG tablet Take 1 tablet (88 mcg total) by mouth daily before breakfast.   No facility-administered encounter medications on file as of 01/24/2023.    ALLERGIES: Allergies  Allergen Reactions   Coconut (Cocos Nucifera) Anaphylaxis   Other Shortness Of Breath    Covid booster pfizer     Prednisone Hypertension    Severe headache   Iodinated Contrast Media Swelling    Pt reported throat swelling post-procedure that resolved with Benadryl at home   Petrolatum Dermatitis    Caused incision irritation    Naproxen Other (See Comments)    headache   VACCINATION STATUS: Immunization History  Administered Date(s) Administered   Fluad Quad(high Dose 65+) 02/11/2020, 01/20/2021, 01/25/2022   Fluad Trivalent(High Dose 65+)  01/22/2023   Influenza, High Dose Seasonal PF 02/08/2018, 02/17/2019   Influenza,inj,Quad PF,6+ Mos 02/28/2017   Influenza-Unspecified 02/28/2017   Moderna Covid-19 Vaccine Bivalent Booster 61yrs & up 02/03/2022   PFIZER(Purple Top)SARS-COV-2 Vaccination 06/06/2019, 06/24/2019, 04/21/2020, 10/07/2020, 02/22/2021   Pneumococcal Conjugate-13 07/05/2016   Pneumococcal Polysaccharide-23 04/29/2019   Td 12/23/2018   Zoster Recombinant(Shingrix) 06/06/2021    HPI    Kara [name]  is a patient with the above medical history. she was diagnosed  with hyperthyroidism at approximate age of 33 years  which required I-131 thyroid ablation in environment.   -She was subsequently initiated on thyroid hormone supplement.  She is currently on Synthroid 100 mcg p.o. daily before breakfast.     She reports compliance and consistency with her medication.  Her previous thyroid function test In recent over replacement.   She has prediabetes not on medication.  Her point-of-care A1c remains at 6.2%.  She has dyslipidemia, does not want to go on statins.  Her engagement with Medicine nutrition is suboptimal.    -She presents with steady weight since last visit.  She denies tremors, palpitations. Pt denies feeling nodules in neck, hoarseness, dysphagia/odynophagia, SOB with lying down.  she denies family history of  thyroid disorders.  No family history of thyroid cancer.  Her father had head and neck tumor which did not appear to be related to thyroid cancer.  ROS:  Limited as above.   Physical Exam: BP 112/66   Pulse 64   Ht 5\' 6"  (1.676 m)   Wt 221 lb (100.2 kg)   BMI 35.67 kg/m  Wt Readings from Last 3 Encounters:  01/24/23 221 lb (100.2 kg)  01/23/23 222 lb (100.7 kg)  01/22/23 221 lb (100.2 kg)    CMP     Component Value Date/Time   NA 139 01/16/2023 1128   K 4.2 01/16/2023 1128   CL 102 01/16/2023 1128   CO2 23 01/16/2023 1128   GLUCOSE 114 (H) 01/16/2023 1128   GLUCOSE 112 (H)  04/25/2022 1500   BUN 16 01/16/2023 1128   CREATININE 0.82 01/16/2023 1128   CREATININE 0.75 03/08/2020 0935   CALCIUM 10.0 01/16/2023 1128   PROT 6.8 01/16/2023 1128   ALBUMIN 3.9 01/16/2023 1128   AST 23 01/16/2023 1128   ALT 23 01/16/2023 1128   ALKPHOS 159 (H) 01/16/2023 1128   BILITOT 0.4 01/16/2023 1128  GFRNONAA 52 (L) 04/25/2022 1500   GFRNONAA 70 10/22/2019 0958   GFRAA 82 10/22/2019 0958    Diabetic Labs (most recent): Lab Results  Component Value Date   HGBA1C 6.2 01/24/2023   HGBA1C 6.1 10/19/2022   HGBA1C 6.3 04/19/2022   MICROALBUR 0.5 08/16/2017   MICROALBUR 0.3 02/19/2017    Lipid Panel     Component Value Date/Time   CHOL 222 (H) 01/16/2023 1128   TRIG 85 01/16/2023 1128   HDL 50 01/16/2023 1128   CHOLHDL 4.4 01/16/2023 1128   CHOLHDL 3.0 10/22/2019 0958   VLDL 15 11/24/2016 1018   LDLCALC 157 (H) 01/16/2023 1128   LDLCALC 114 (H) 10/22/2019 0958   LABVLDL 15 01/16/2023 1128     Lab Results  Component Value Date   TSH 0.454 01/16/2023   TSH 0.288 (L) 10/13/2022   TSH 4.420 04/13/2022   TSH 2.720 01/09/2022   TSH 0.980 07/07/2021   TSH 0.62 03/02/2021   TSH 3.340 10/25/2020   TSH 1.48 03/08/2020   TSH 0.78 09/08/2019   TSH 0.34 (L) 02/27/2019   FREET4 1.80 (H) 01/16/2023   FREET4 1.91 (H) 10/13/2022   FREET4 1.32 04/13/2022   FREET4 1.35 01/09/2022   FREET4 1.52 07/07/2021   FREET4 1.5 03/02/2021   FREET4 1.56 10/25/2020   FREET4 1.4 03/08/2020   FREET4 1.3 09/08/2019   FREET4 1.5 02/27/2019       ASSESSMENT: 1. Hypothyroidism- RAI induced 2.  Prediabetes 3. Hyperlipidemia 4. Vitamin d deficiency   PLAN:   Her previsit thyroid function tests are consistent with over replacement.  She is approached for a lower dose of Synthroid at 88 mcg until next measurement.   - We discussed about the correct intake of her thyroid hormone, on empty stomach at fasting, with water, separated by at least 30 minutes from breakfast and other  medications,  and separated by more than 4 hours from calcium, iron, multivitamins, acid reflux medications (PPIs). -Patient is made aware of the fact that thyroid hormone replacement is needed for life, dose to be adjusted by periodic monitoring of thyroid function tests.    -Due to absence of clinical goiter, no need for thyroid ultrasound. In light of her metabolic dysfunction indicated by prediabetes with point-of-care A1c of 6.2%, severe hyperlipidemia, and the fact that she wants to avoid statin medications, obesity, she remains a good candidate for lifestyle medicine.    -She was approached for low-dose statin intervention, however declines again. -She wishes to try lifestyle medicine nutrition again.  - she acknowledges that there is a room for improvement in her food and drink choices. - Suggestion is made for her to avoid simple carbohydrates  from her diet including Cakes, Sweet Desserts, Ice Cream, Soda (diet and regular), Sweet Tea, Candies, Chips, Cookies, Store Bought Juices, Alcohol , Artificial Sweeteners,  Coffee Creamer, and "Sugar-free" Products, Lemonade. This will help patient to have more stable blood glucose profile and potentially avoid unintended weight gain.  The following Lifestyle Medicine recommendations according to American College of Lifestyle Medicine  [facility]) were discussed and and offered to patient and she  agrees to start the journey:  A. Whole Foods, Plant-Based Nutrition comprising of fruits and vegetables, plant-based proteins, whole-grain carbohydrates was discussed in detail with the patient.   A list for source of those nutrients were also provided to the patient.  Patient will use only water or unsweetened tea for hydration. B.  The need to stay away from risky substances including  alcohol, smoking; obtaining 7 to 9 hours of restorative sleep, at least 150 minutes of moderate intensity exercise weekly, the importance of healthy social connections,  and  stress management techniques were discussed. C.  A full color page of  Calorie density of various food groups per pound showing examples of each food groups was provided to the patient.   This approach will help her with weight management, prediabetes, HPL, HTN.  She is advised to maintain close follow-up with her PCP.    I spent  25  minutes in the care of the patient today including review of labs from Thyroid Function, CMP, and other relevant labs ; imaging/biopsy records (current and previous including abstractions from other facilities); face-to-face time discussing  her lab results and symptoms, medications doses, her options of short and long term treatment based on the latest standards of care / guidelines;   and documenting the encounter.  Kara [name]  participated in the discussions, expressed understanding, and voiced agreement with the above plans.  All questions were answered to her satisfaction. she is encouraged to contact clinic should she have any questions or concerns prior to her return visit.   Return in about 6 months (around 07/24/2023) for Fasting Labs  in AM B4 8.  Marquis Lunch, MD [facility] [address] [place], [place] [address] Phone: [phone]  Fax: [phone]   01/24/2023, 4:30 PM  This note was partially dictated with voice recognition software. Similar sounding words can be transcribed inadequately or may not  be corrected upon review.

## 2023-01-26 NOTE — Assessment & Plan Note (Signed)
STOP-BANG: 5 Home sleep study showed moderate OSA Have tried to order CPAP through 2 different DMEs, but she was told that insurance does not have enough information - multiple attempts made to send the office notes and sleep study so far  Advised to contact insurance company and ask about preferred DME

## 2023-01-26 NOTE — Assessment & Plan Note (Signed)
Lab Results  Component Value Date   HGBA1C 6.2 01/24/2023   Advised to follow low carb diet for now

## 2023-01-30 ENCOUNTER — Encounter: Payer: Self-pay | Admitting: *Deleted

## 2023-01-30 ENCOUNTER — Encounter: Payer: Self-pay | Admitting: Internal Medicine

## 2023-01-31 NOTE — Progress Notes (Signed)
[facility] PREP Weekly Session  Patient Details  Name: Kara [name] MRN: [id number] Date of Birth: 08/04/1945 Age: 77 y.o. PCP: Anabel Halon, MD  There were no vitals filed for this visit.   [facility] Weekly seesion - 01/30/23 1500       [facility] "PREP" Location   [facility] "PREP" Location [facility]      Weekly Session   Topic Discussed Other   Portion Control. Healthy eating plate, stratagies for controlling portion size, visual aids.   Minutes exercised this week 475 minutes    Classes attended to date 47             [name] 01/31/2023, 11:35 AM

## 2023-02-06 ENCOUNTER — Encounter: Payer: Self-pay | Admitting: *Deleted

## 2023-02-06 NOTE — Progress Notes (Signed)
[facility] PREP Weekly Session  Patient Details  Name: Kara [name] MRN: [id number] Date of Birth: 04/15/1946 Age: 77 y.o. PCP: Anabel Halon, MD  Vitals:   02/06/23 1330  Weight: 223 lb (101.2 kg)     [facility] Weekly seesion - 02/06/23 1500       [facility] "PREP" Location   [facility] "PREP" Location [facility]      Weekly Session   Topic Discussed Finding support   Accountability partners, recognizing non-supportive people, finding exercise that you enjoy and has variety, surrounding yourself with positive energy.   Minutes exercised this week 405 minutes    Classes attended to date 72             [name] 02/06/2023, 8:21 PM

## 2023-02-13 ENCOUNTER — Ambulatory Visit (INDEPENDENT_AMBULATORY_CARE_PROVIDER_SITE_OTHER): Payer: Medicare Other | Admitting: Orthopedic Surgery

## 2023-02-13 ENCOUNTER — Other Ambulatory Visit (INDEPENDENT_AMBULATORY_CARE_PROVIDER_SITE_OTHER): Payer: Self-pay

## 2023-02-13 ENCOUNTER — Encounter: Payer: Self-pay | Admitting: Orthopedic Surgery

## 2023-02-13 VITALS — BP 101/70 | HR 79 | Ht 66.0 in | Wt 220.0 lb

## 2023-02-13 DIAGNOSIS — M25561 Pain in right knee: Secondary | ICD-10-CM

## 2023-02-13 DIAGNOSIS — G8929 Other chronic pain: Secondary | ICD-10-CM

## 2023-02-13 NOTE — Patient Instructions (Signed)

## 2023-02-14 ENCOUNTER — Other Ambulatory Visit: Payer: Self-pay | Admitting: Internal Medicine

## 2023-02-14 ENCOUNTER — Encounter: Payer: Self-pay | Admitting: Orthopedic Surgery

## 2023-02-14 DIAGNOSIS — M7989 Other specified soft tissue disorders: Secondary | ICD-10-CM

## 2023-02-14 NOTE — Progress Notes (Signed)
New Patient Visit  Assessment: Kara [name] is a 77 y.o. female with the following: 1. Chronic pain of right knee  Plan: Kara [name] has chronic pain in the right knee.  It is progressively worsening.  No specific injury.  She is more concerned about bilateral lower extremity swelling.  Because it is in bilateral legs, this is likely more related to medical comorbidities.  We discussed multiple treatment options for ongoing right knee pain.  She is not interested in a steroid injection.  She will continue with bracing, consider a longer compression stocking.  She will also continue with her over-the-counter medications.  If she wished to return for an injection at any time, she will contact clinic.  In regards to the bilateral lower extremity swelling, I encouraged her to discuss this with her primary care physician.  Follow-up: Return if symptoms worsen or fail to improve.  Subjective:  Chief Complaint  Patient presents with   Knee Pain    R knee pain     History of Present Illness: Kara [name] is a 77 y.o. female who presents for evaluation of right knee pain.  She has had knee pain for years.  No specific injury.  Over the past couple months, she notes progressive worsening of pain.  She also notes some bilateral lower extremity swelling, from the knee distal.  She is wearing compression stockings, and the start to do again in the posterior aspect of the right knee.  This is causing some pain.  She has recently switched medications from Lasix to HCTZ.  Since this switch, she notes more swelling.   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  The STAT Breast cancer panel offered by [facility] includes sequencing and rearrangement analysis for the following 9 genes:  ATM, BRCA1, BRCA2, CDH1, CHEK2, PALB2, PTEN, STK11 and TP53.   The report date is August 09, 2019.   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 fibroids   BIOPSY  05/02/2022   Procedure: BIOPSY;  Surgeon: Lanelle Bal, DO;  Location: [facility];  Service: Endoscopy;;   BREAST BIOPSY Left 05/2019   left breast DCIS   BREAST LUMPECTOMY Left 06/2019   DCIS   BREAST LUMPECTOMY WITH RADIOACTIVE SEED LOCALIZATION Left 07/08/2019   Procedure: LEFT BREAST LUMPECTOMY WITH RADIOACTIVE SEED LOCALIZATION;  Surgeon: Griselda Miner, MD;   Location: [facility];  Service: General;  Laterality: Left;   BREAST SURGERY N/A    Phreesia 08/30/2020   CESAREAN SECTION     CESAREAN SECTION N/A    Phreesia 08/30/2020   CHOLECYSTECTOMY     COLONOSCOPY  2011   [place]: two 3-4 mm sessile polyps, hyperplastic, sigmoid diverticula, TI appeared normal.    COLONOSCOPY WITH PROPOFOL N/A 05/02/2022   Procedure: COLONOSCOPY WITH PROPOFOL;  Surgeon: Lanelle Bal, DO;  [facility]: [facility];  Service: Endoscopy;  Laterality: N/A;  11:30 am   ESOPHAGOGASTRODUODENOSCOPY  2011   [place]: non-bleeding erosive gastropathy, normal duodenum. path: unremarkable duodenum, negative H.pylori, minimal esophagitis    ESOPHAGOGASTRODUODENOSCOPY N/A 11/23/2022   Procedure: ESOPHAGOGASTRODUODENOSCOPY (EGD);  Surgeon: Lemar Lofty., MD;  Location: [facility];  Service: Gastroenterology;  Laterality: N/A;   ESOPHAGOGASTRODUODENOSCOPY (EGD) WITH PROPOFOL N/A 05/02/2022   Procedure: ESOPHAGOGASTRODUODENOSCOPY (EGD) WITH PROPOFOL;  Surgeon: Lanelle Bal, DO;  [facility];  Service: Endoscopy;  Laterality: N/A;   EUS N/A 11/23/2022   Procedure: UPPER ENDOSCOPIC ULTRASOUND (EUS) RADIAL;  Surgeon: Lemar Lofty., MD;  Location: [facility];  Service: Gastroenterology;  Laterality: N/A;   EYE SURGERY  2022   POLYPECTOMY  05/02/2022   Procedure: POLYPECTOMY;  Surgeon: Lanelle Bal, DO;  Location: [facility];  Service: Endoscopy;;   REFRACTIVE SURGERY  2024   TONSILLECTOMY  1975    Family History  Problem Relation Age of Onset   Breast cancer Maternal Grandmother        dx over 31   Prostate cancer Maternal Grandfather    Arthritis Mother    COPD Mother    Breast cancer Mother 89       second at age 37   Hearing loss Mother    Cancer Father        throat   Stroke Father    Cancer Maternal Aunt 29       [name], cervical cancer   Breast cancer Maternal Aunt        dx over 41    Breast cancer Paternal Aunt        dx under 50   Prostate cancer Maternal Uncle        dx over 59   Cancer Other        father's maternal cousin was Henreietta Lacks, Cervical   Cancer Maternal Uncle        unknown cancer   Breast cancer Paternal Aunt        dx over 41   Colon cancer Neg Hx    Allergic rhinitis Neg Hx    Angioedema Neg Hx    Asthma Neg Hx    Atopy Neg Hx    Eczema Neg Hx    Urticaria Neg Hx    Immunodeficiency Neg Hx    Social  History   Tobacco Use   Smoking status: Former    Current packs/day: 0.00    Average packs/day: 0.5 packs/day for 15.0 years (7.5 ttl pk-yrs)    Types: Cigarettes    Start date: 06/16/1983    Quit date: 06/15/1998    Years since quitting: 24.6   Smokeless tobacco: Never   Tobacco comments:    quit 2002  Vaping Use   Vaping status: Never Used  Substance Use Topics   Alcohol use: No   Drug use: Never    Allergies  Allergen Reactions   Coconut (Cocos Nucifera) Anaphylaxis   Other Shortness Of Breath    Covid booster pfizer     Prednisone Hypertension    Severe headache   Iodinated Contrast Media Swelling    Pt reported throat swelling post-procedure that resolved with Benadryl at home   Petrolatum Dermatitis    Caused incision irritation    Naproxen Other (See Comments)    headache    Current Meds  Medication Sig   acetaminophen (TYLENOL) 500 MG tablet Take 500 mg by mouth every 8 (eight) hours as needed for moderate pain.   albuterol (VENTOLIN HFA) 108 (90 Base) MCG/ACT inhaler Inhale 1 puff into the lungs every 6 (six) hours as needed for wheezing or shortness of breath.   Carboxymethylcell-Glycerin PF (REFRESH RELIEVA PF) 0.5-1 % SOLN Place 1 drop into both eyes in the morning, at noon, in the evening, and at bedtime.   hydrochlorothiazide (HYDRODIURIL) 12.5 MG tablet TAKE 1 TABLET(12.5 MG) BY MOUTH DAILY   latanoprost (XALATAN) 0.005 % ophthalmic solution Place 1 drop into both eyes at bedtime.   Misc Natural Products  (SAMBUCUS ELDERBERRY VITAMIN C MT) Take 1 each by mouth daily. Gummy   Misc. Devices MISC Compression stockings XL: 20-30 mm Hg   SYNTHROID 88 MCG tablet Take 1 tablet (88 mcg total) by mouth daily before breakfast.   Thiamine HCl (VITAMIN B-1 PO) Take 1 tablet by mouth in the morning.    Objective: BP 101/70   Pulse 79   Ht 5\' 6"  (1.676 m)   Wt 220 lb (99.8 kg)   BMI 35.51 kg/m   Physical Exam:  General: Alert and oriented. and No acute distress. Gait: Right sided antalgic gait.  Bilateral lower extremities with diffuse swelling.  No redness.  Mild effusion of the right knee.  She has good range of motion.  Tenderness to palpation along the medial joint line.  Mild tenderness to palpation within the posterior knee.  Negative Lachman.  No increased laxity varus valgus stress.  IMAGING: I personally ordered and reviewed the following images   Trays of the right knee were obtained in clinic today.  No acute injuries noted.  Mild to moderate loss of joint space within the medial compartment of the right knee.  Small osteophytes are appreciated throughout the knee, including patellofemoral joint.  No bony lesions.  Impression: Right knee x-rays with mild degenerative changes.   New Medications:  No orders of the defined types were placed in this encounter.     Oliver Barre, MD  02/14/2023 8:58 AM

## 2023-02-15 ENCOUNTER — Ambulatory Visit (INDEPENDENT_AMBULATORY_CARE_PROVIDER_SITE_OTHER): Payer: Medicare Other | Admitting: Internal Medicine

## 2023-02-15 ENCOUNTER — Encounter: Payer: Self-pay | Admitting: Internal Medicine

## 2023-02-15 VITALS — BP 101/59 | HR 74 | Temp 98.6°F | Ht 66.0 in | Wt 220.4 lb

## 2023-02-15 DIAGNOSIS — R7989 Other specified abnormal findings of blood chemistry: Secondary | ICD-10-CM

## 2023-02-15 DIAGNOSIS — K529 Noninfective gastroenteritis and colitis, unspecified: Secondary | ICD-10-CM | POA: Diagnosis not present

## 2023-02-15 DIAGNOSIS — K219 Gastro-esophageal reflux disease without esophagitis: Secondary | ICD-10-CM

## 2023-02-15 DIAGNOSIS — K7689 Other specified diseases of liver: Secondary | ICD-10-CM | POA: Diagnosis not present

## 2023-02-15 DIAGNOSIS — R1011 Right upper quadrant pain: Secondary | ICD-10-CM

## 2023-02-15 DIAGNOSIS — K8689 Other specified diseases of pancreas: Secondary | ICD-10-CM

## 2023-02-15 DIAGNOSIS — R748 Abnormal levels of other serum enzymes: Secondary | ICD-10-CM

## 2023-02-15 NOTE — Patient Instructions (Signed)
We will plan on ultrasound in 6 months to reevaluate the size of your liver cysts.  I will give you samples of Zenpep which is pancreatic enzyme replacement.  Take 2 capsules with each meal, let me know next week if abdominal pain and/or bowels are improved then I can send in formal prescription.  Follow-up in 3 months.  It was very nice seeing you again today.  Dr. Marletta Lor

## 2023-02-15 NOTE — Progress Notes (Signed)
Referring Provider: Anabel Halon, MD Primary Care Physician:  Anabel Halon, MD Primary GI:  Dr. Marletta Lor  Chief Complaint  Patient presents with   Follow-up    Follow up on abd pain. Still states she still has it    HPI:   Kara [name] is a 77 y.o. female who presents to clinic today for follow up visit. She has a history of abdominal pain, GERD, adenomatous colon polyps.  Extensive workup for her chronic right upper quadrant abdominal pain and chronically elevated alkaline phosphatase level as below:  EGD 04/2022: -2cm hh -mild Schatzki ring -gastritis, reactive gastropathy, no H.pylori -congested ampulla, mild acute duodenitis   Colonoscopy 04/2022: -diverticulosis -five polyps removed, tubular adenomas -Recall 3 years  EUS 11/23/2022 -Normal CBD, no choledocho, normal ampulla, normal pancreatic duct, ?fatty pancreas, multiple liver cysts.  CT abdomen pelvis with contrast 08/24/2022 mild pelvic lymphadenopathy bilateral iliac regions consider follow-up CT versus PET/CT or tissue sampling.  Followed up with her oncologist who recommended repeat CT in 3 months  CT chest abdomen pelvis with contrast 12/07/2022 overall unremarkable, lymphadenopathy resolved.  Multiple liver cysts, largest 5 cm  Elevated alk phos: GGT elevated at 74, lipase 29, AMA negative  She has had chronic intermittent right upper quadrant pain off and on for years.  Worse with certain foods.  Sore to touch, burning in quality.  Pepcid helped a little previously.  Gallbladder removed remotely for this pain but did not make it any better.  Took pantoprazole without improvement.  Pain sometimes sharp, sometimes pressure.  Occurs every day  Also notes chronically loose stools, 1-2 bowel movements per day.  Has not had a formed stool in over a year.   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  The STAT Breast cancer panel offered by [facility] includes sequencing and rearrangement analysis for the following 9 genes:  ATM, BRCA1, BRCA2, CDH1, CHEK2, PALB2, PTEN, STK11 and TP53.   The report date is August 09, 2019.   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 fibroids   BIOPSY  05/02/2022   Procedure: BIOPSY;  Surgeon: Lanelle Bal, DO;  [facility];  Service: Endoscopy;;   BREAST BIOPSY Left 05/2019   left breast DCIS   BREAST LUMPECTOMY Left 06/2019   DCIS   BREAST LUMPECTOMY WITH RADIOACTIVE SEED LOCALIZATION Left 07/08/2019   Procedure: LEFT BREAST LUMPECTOMY WITH RADIOACTIVE SEED  LOCALIZATION;  Surgeon: Griselda Miner, MD;  Location: [facility];  Service: General;  Laterality: Left;   BREAST SURGERY N/A    Phreesia 08/30/2020   CESAREAN SECTION     CESAREAN SECTION N/A    Phreesia 08/30/2020   CHOLECYSTECTOMY     COLONOSCOPY  2011   [place]: two 3-4 mm sessile polyps, hyperplastic, sigmoid diverticula, TI appeared normal.    COLONOSCOPY WITH PROPOFOL N/A 05/02/2022   Procedure: COLONOSCOPY WITH PROPOFOL;  Surgeon: Lanelle Bal, DO;  [facility];  Service: Endoscopy;  Laterality: N/A;  11:30 am   ESOPHAGOGASTRODUODENOSCOPY  2011   [place]: non-bleeding erosive gastropathy, normal duodenum. path: unremarkable duodenum, negative H.pylori, minimal esophagitis    ESOPHAGOGASTRODUODENOSCOPY N/A 11/23/2022   Procedure: ESOPHAGOGASTRODUODENOSCOPY (EGD);  Surgeon: Lemar Lofty., MD;  Location: [facility];  Service: Gastroenterology;  Laterality: N/A;   ESOPHAGOGASTRODUODENOSCOPY (EGD) WITH PROPOFOL N/A 05/02/2022   Procedure: ESOPHAGOGASTRODUODENOSCOPY (EGD) WITH PROPOFOL;  Surgeon: Lanelle Bal, DO;  [facility];  Service: Endoscopy;  Laterality: N/A;   EUS N/A 11/23/2022   Procedure: UPPER ENDOSCOPIC ULTRASOUND (EUS) RADIAL;  Surgeon: Lemar Lofty., MD;  Location: [facility];  Service: Gastroenterology;  Laterality: N/A;   EYE SURGERY  2022   POLYPECTOMY  05/02/2022   Procedure: POLYPECTOMY;  Surgeon: Lanelle Bal, DO;  [facility];  Service: Endoscopy;;   REFRACTIVE SURGERY  2024   TONSILLECTOMY  1975    Current Outpatient Medications  Medication Sig Dispense Refill   acetaminophen (TYLENOL) 500 MG tablet Take 500 mg by mouth every 8 (eight) hours as needed for moderate pain.     albuterol (VENTOLIN HFA) 108 (90 Base) MCG/ACT inhaler Inhale 1 puff into the lungs every 6 (six) hours as needed for wheezing or shortness of breath. 6.7 g 2   Carboxymethylcell-Glycerin PF (REFRESH  RELIEVA PF) 0.5-1 % SOLN Place 1 drop into both eyes in the morning, at noon, in the evening, and at bedtime.     hydrochlorothiazide (HYDRODIURIL) 12.5 MG tablet TAKE 1 TABLET(12.5 MG) BY MOUTH DAILY 30 tablet 3   latanoprost (XALATAN) 0.005 % ophthalmic solution Place 1 drop into both eyes at bedtime.     Misc Natural Products (SAMBUCUS ELDERBERRY VITAMIN C MT) Take 1 each by mouth daily. Gummy     SYNTHROID 88 MCG tablet Take 1 tablet (88 mcg total) by mouth daily before breakfast. 90 tablet 1   Thiamine HCl (VITAMIN B-1 PO) Take 1 tablet by mouth in the morning.     Misc. Devices MISC Compression stockings XL: 20-30 mm Hg 1 each 0   No current facility-administered medications for this visit.    Allergies as of 02/15/2023 - Review Complete 02/15/2023  Allergen Reaction Noted   Coconut (cocos nucifera) Anaphylaxis 06/30/2020   Other Shortness Of Breath 01/20/2011   Prednisone Hypertension 12/03/2018   Iodinated contrast media Swelling 12/04/2022   Petrolatum Dermatitis 04/28/2022   Naproxen Other (See Comments) 11/09/2015  Family History  Problem Relation Age of Onset   Breast cancer Maternal Grandmother        dx over 44   Prostate cancer Maternal Grandfather    Arthritis Mother    COPD Mother    Breast cancer Mother 34       second at age 36   Hearing loss Mother    Cancer Father        throat   Stroke Father    Cancer Maternal Aunt 7       [name], cervical cancer   Breast cancer Maternal Aunt        dx over 29   Breast cancer Paternal Aunt        dx under 50   Prostate cancer Maternal Uncle        dx over 56   Cancer Other        father's maternal cousin was Henreietta Lacks, Cervical   Cancer Maternal Uncle        unknown cancer   Breast cancer Paternal Aunt        dx over 71   Colon cancer Neg Hx    Allergic rhinitis Neg Hx    Angioedema Neg Hx    Asthma Neg Hx    Atopy Neg Hx    Eczema Neg Hx    Urticaria Neg Hx    Immunodeficiency Neg Hx      Social History   Socioeconomic History   Marital status: Divorced    Spouse name: Not on file   Number of children: 2   Years of education: 15   Highest education level: Not on file  Occupational History   Occupation: retired    Comment: Loss adjuster, chartered at a Database administrator  Tobacco Use   Smoking status: Former    Current packs/day: 0.00    Average packs/day: 0.5 packs/day for 15.0 years (7.5 ttl pk-yrs)    Types: Cigarettes    Start date: 06/16/1983    Quit date: 06/15/1998    Years since quitting: 24.6   Smokeless tobacco: Never   Tobacco comments:    quit 2002  Vaping Use   Vaping status: Never Used  Substance and Sexual Activity   Alcohol use: No   Drug use: Never   Sexual activity: Not Currently    Birth control/protection: Surgical, None    Comment: hyst  Other Topics Concern   Not on file  Social History Narrative   Lives alone   2 grown children- one in [place] and one here   Grandchildren       Moved to [place] from [place] for aging mother who is 12 with Alzheimers      Maternal Aunt is Animator of the [employer] cancer call line      Enjoy: gym, time with friends       Diet: eats all food groups   Caffeine: 2 diet cokes daily    Water: 2-4 cups daily       Wears seat belt    Does not use phone while driving    Smoke and carbon detectors at home   [employer]    No weapons       Right Handed    Lives alone in a one story home    Retired   International aid/development worker of Health   Financial Resource Strain: Low Risk  (11/22/2022)   Overall Financial Resource Strain (CARDIA)    Difficulty of Paying  Living Expenses: Not hard at all  Food Insecurity: No Food Insecurity (11/22/2022)   Hunger Vital Sign    Worried About Running Out of Food in the Last Year: Never true    Ran Out of Food in the Last Year: Never true  Transportation Needs: No Transportation Needs (11/22/2022)   PRAPARE - Administrator, Civil Service  (Medical): No    Lack of Transportation (Non-Medical): No  Physical Activity: Sufficiently Active (11/22/2022)   Exercise Vital Sign    Days of Exercise per Week: 7 days    Minutes of Exercise per Session: 60 min  Stress: No Stress Concern Present (11/22/2022)   [employer] of Occupational Health - Occupational Stress Questionnaire    Feeling of Stress : Not at all  Social Connections: Moderately Isolated (11/22/2022)   Social Connection and Isolation Panel [NHANES]    Frequency of Communication with Friends and Family: More than three times a week    Frequency of Social Gatherings with Friends and Family: More than three times a week    Attends Religious Services: Never    Database administrator or Organizations: Yes    Attends Banker Meetings: 1 to 4 times per year    Marital Status: Divorced    Subjective: Review of Systems  Constitutional:  Negative for chills and fever.  HENT:  Negative for congestion and hearing loss.   Eyes:  Negative for blurred vision and double vision.  Respiratory:  Negative for cough and shortness of breath.   Cardiovascular:  Negative for chest pain and palpitations.  Gastrointestinal:  Positive for abdominal pain. Negative for blood in stool, constipation, diarrhea, heartburn, melena and vomiting.  Genitourinary:  Negative for dysuria and urgency.  Musculoskeletal:  Negative for joint pain and myalgias.  Skin:  Negative for itching and rash.  Neurological:  Negative for dizziness and headaches.  Psychiatric/Behavioral:  Negative for depression. The patient is not nervous/anxious.      Objective: BP (!) 101/59   Pulse 74   Temp 98.6 F (37 C)   Ht 5\' 6"  (1.676 m)   Wt 220 lb 6.4 oz (100 kg)   BMI 35.57 kg/m  Physical Exam Constitutional:      Appearance: Normal appearance.  HENT:     Head: Normocephalic and atraumatic.  Eyes:     Extraocular Movements: Extraocular movements intact.     Conjunctiva/sclera: Conjunctivae  normal.  Cardiovascular:     Rate and Rhythm: Normal rate and regular rhythm.  Pulmonary:     Effort: Pulmonary effort is normal.     Breath sounds: Normal breath sounds.  Abdominal:     General: Bowel sounds are normal.     Palpations: Abdomen is soft.  Musculoskeletal:        General: No swelling. Normal range of motion.     Cervical back: Normal range of motion and neck supple.  Skin:    General: Skin is warm and dry.     Coloration: Skin is not jaundiced.  Neurological:     General: No focal deficit present.     Mental Status: She is alert and oriented to person, place, and time.  Psychiatric:        Mood and Affect: Mood normal.        Behavior: Behavior normal.      Assessment: *Right upper quadrant pain-chronic *Elevated alk phos *Chronic GERD *Chronically loose stools  Plan: Etiology of her ongoing right upper quadrant pain unclear.  Not improved with pantoprazole or Pepcid.  EUS largely unremarkable.  Possibly her liver cysts are playing a role in her pain.  Offered IR referral for drainage of the 5 cm cyst to see if this helps with her pain.  At the same time could potentially perform liver biopsy given her chronically elevated alk phos.  She would like to repeat ultrasound in 6 months to see if her cyst has increased in size before considering this.  Offered to trial different PPI such as dexlansoprazole, she is not interested in this.  Possibly she has functional abdominal pain.  Discussed trial of low-dose TCA.  She is not interested in this.  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  Discussed trial of pancreatic enzymes to see if this helps and she would like to try this.  Will give samples of Zenpep today, call with update next week and if improved will send in formal prescription.   Follow-up in 3 months  02/15/2023 9:14 AM   Disclaimer: This note was dictated with voice recognition  software. Similar sounding words can inadvertently be transcribed and may not be corrected upon review.

## 2023-03-02 ENCOUNTER — Encounter: Payer: Self-pay | Admitting: *Deleted

## 2023-03-02 NOTE — Progress Notes (Signed)
[facility] PREP Evaluation  Patient Details  Name: Kara [name] MRN: [id number] Date of Birth: 12-14-1945 Age: 77 y.o. PCP: Anabel Halon, MD  Vitals:   03/02/23 1218  BP: 136/78  Pulse: 76  Resp: 18  SpO2: 98%  Weight: 225 lb 6.4 oz (102.2 kg)     [facility] Eval - 03/02/23 1200       [facility] "PREP" Location   [facility] "PREP" Location [facility]      Referral    Referring Provider Patel    Reason for referral Obesitity/Overweight;Inactivity    Program Start Date 12/12/22    Program End Date 03/01/23      Measurement   Waist Circumference 40.75 inches    Waist Circumference End Program 40.5 inches    Hip Circumference 41.75 inches    Hip Circumference End Program 41.5 inches    Body fat 48.5 percent      Mobility and Daily Activities   I find it easy to walk up or down two or more flights of stairs. 3    I have no trouble taking out the trash. 4    I do housework such as vacuuming and dusting on my own without difficulty. 4    I can easily lift a gallon of milk (8lbs). 4    I can easily walk a mile. 4    I have no trouble reaching into high cupboards or reaching down to pick up something from the floor. 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 4      Mobility and Daily Activities   I feel younger than my age. 4    I feel independent. 4    I feel energetic. 4    I live an active life.  4    I feel strong. 4    I feel healthy. 4    I feel active as other people my age. 4      How fit and strong are you.   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  The STAT Breast cancer panel offered by [facility] includes sequencing and rearrangement analysis for the following 9 genes:  ATM, BRCA1, BRCA2, CDH1, CHEK2, PALB2, PTEN, STK11 and TP53.   The report date is August 09, 2019.   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 fibroids   BIOPSY  05/02/2022   Procedure: BIOPSY;  Surgeon: Lanelle Bal, DO;  [facility];  Service: Endoscopy;;   BREAST BIOPSY Left 05/2019   left breast DCIS   BREAST LUMPECTOMY Left 06/2019   DCIS   BREAST LUMPECTOMY WITH RADIOACTIVE SEED LOCALIZATION Left 07/08/2019   Procedure: LEFT BREAST LUMPECTOMY WITH RADIOACTIVE SEED LOCALIZATION;  Surgeon: Griselda Miner, MD;  Location:  SUR[facility]ERY CENTER;  Service: General;  Laterality: Left;   BREAST SURGERY N/A    Phreesia 08/30/2020   CESAREAN SECTION     CESAREAN SECTION N/A     Phreesia 08/30/2020   CHOLECYSTECTOMY     COLONOSCOPY  2011   [place]: two 3-4 mm sessile polyps, hyperplastic, sigmoid diverticula, TI appeared normal.    COLONOSCOPY WITH PROPOFOL N/A 05/02/2022   Procedure: COLONOSCOPY WITH PROPOFOL;  Surgeon: Lanelle Bal, DO;  [facility];  Service: Endoscopy;  Laterality: N/A;  11:30 am   ESOPHAGOGASTRODUODENOSCOPY  2011   [place]: non-bleeding erosive gastropathy, normal duodenum. path: unremarkable duodenum, negative H.pylori, minimal esophagitis    ESOPHAGOGASTRODUODENOSCOPY N/A 11/23/2022   Procedure: ESOPHAGOGASTRODUODENOSCOPY (EGD);  Surgeon: Lemar Lofty., MD;  Location: [facility];  Service: Gastroenterology;  Laterality: N/A;   ESOPHAGOGASTRODUODENOSCOPY (EGD) WITH PROPOFOL N/A 05/02/2022   Procedure: ESOPHAGOGASTRODUODENOSCOPY (EGD) WITH PROPOFOL;  Surgeon: Lanelle Bal, DO;  [facility];  Service: Endoscopy;  Laterality: N/A;   EUS N/A 11/23/2022   Procedure: UPPER ENDOSCOPIC ULTRASOUND (EUS) RADIAL;  Surgeon: Lemar Lofty., MD;  [facility];  Service: Gastroenterology;  Laterality: N/A;   EYE SURGERY  2022   POLYPECTOMY  05/02/2022   Procedure: POLYPECTOMY;  Surgeon: Lanelle Bal, DO;  [facility];  Service: Endoscopy;;   REFRACTIVE SURGERY  2024   TONSILLECTOMY  1975   Social History   Tobacco Use  Smoking Status Former   Current packs/day: 0.00   Average packs/day: 0.5 packs/day for 15.0 years (7.5 ttl pk-yrs)   Types: Cigarettes   Start date: 06/16/1983   Quit date: 06/15/1998   Years since quitting: 24.7  Smokeless Tobacco Never  Tobacco Comments   quit 2002    Kara [name] 03/02/2023, 12:24 PM  PREP Complete. Attended 9 of 12 educational classes and 8 of 10 exercise sessions. Kara [name] was a very engaged participant in the program. Her primary objective was to re-establish an exercise routine back into her daily life. She has been working hard  on this goal and has a solid plan for continuing progress. Weight loss has been more of a struggle, but I feel confident in her ability to apply her PREP education towards achieving success in this area. I have enrolled her in [facility] continuing education topics via email for continued support. Keep up the good work [employer]!

## 2023-03-14 ENCOUNTER — Encounter: Payer: Self-pay | Admitting: Internal Medicine

## 2023-04-08 NOTE — Progress Notes (Unsigned)
Cardiology Office Note:    Date:  04/08/2023   ID:  Kara [name], DOB December 20, 1945, MRN [id number]  PCP:  Anabel Halon, MD   [facility] Providers Cardiologist:  Thurmon Fair, MD     Referring MD: Anabel Halon, MD   No chief complaint on file. Kara [name] is a 77 y.o. female who is being seen today for the evaluation of abnormal electrocardiogram and irregular heart beat at the request of Anabel Halon, MD.   History of Present Illness:    Kara [name] is a 77 y.o. female with a hx of hyperlipidemia, prediabetes, peripheral venous insufficiency, treated hypothyroidism, left breast cancer status post surgery and external beam radiation (completed April 2021), GERD, anxiety, who has noticed occasional palpitations.  She has noticed occasional palpitations, but is more worried about the fact that her Apple Watch has demonstrated irregular rhythm and in particular following the report of an abnormal electrocardiogram at her recent office visit.  ECG was abnormal due to borderline short PR interval (PR 116 ms).  She does not have a history of sustained palpitations, known SVT, syncope, valvular heart disease or coronary artery disease.  She does not have hypertension.  She recently had a COVID-19 infection, currently day 11.  The symptoms were quite mild.  She has a smart watch with ECG capability.  We recorded her rhythm during her office visit and she had sustained atrial bigeminy for at least a minute.  She was unaware of the irregular heartbeat.  She felt well during this recording.  She denies exertional angina or dyspnea, orthopnea, PND, worsening lower extremity edema (she always wears compression stockings), claudication, symptoms of stroke/TIA, falls, bleeding problems.  She rarely takes ibuprofen for musculoskeletal complaints.  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  The STAT Breast cancer panel offered by [facility] includes sequencing and rearrangement analysis for the following 9 genes:  ATM, BRCA1, BRCA2, CDH1, CHEK2, PALB2, PTEN, STK11 and TP53.   The report date is August 09, 2019.   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   Perimenopausal vasomotor symptoms 02/11/2016   Personal history of radiation therapy 2021   completed left breast radiation in April 2021   Postmenopausal atrophic vaginitis 11/09/2015   Pre-diabetes    no meds   Prediabetes 08/30/2017   Sleep apnea [id number]   Thyroid disease    Grave's   Vaginal discharge  01/16/2019   Weight loss, unintentional 03/27/2019    Past Surgical History:  Procedure Laterality Date   ABDOMINAL HYSTERECTOMY     bleeding. fibroids   BIOPSY  05/02/2022   Procedure: BIOPSY;  Surgeon: Lanelle Bal, DO;  [facility];  Service: Endoscopy;;   BREAST BIOPSY Left 05/2019   left breast DCIS   BREAST LUMPECTOMY Left 06/2019   DCIS   BREAST LUMPECTOMY WITH RADIOACTIVE SEED LOCALIZATION Left 07/08/2019   Procedure: LEFT BREAST  LUMPECTOMY WITH RADIOACTIVE SEED LOCALIZATION;  Surgeon: Griselda Miner, MD;  Location: [facility];  Service: General;  Laterality: Left;   BREAST SURGERY N/A    Phreesia 08/30/2020   CESAREAN SECTION     CESAREAN SECTION N/A    Phreesia 08/30/2020   CHOLECYSTECTOMY     COLONOSCOPY  2011   [place]: two 3-4 mm sessile polyps, hyperplastic, sigmoid diverticula, TI appeared normal.    COLONOSCOPY WITH PROPOFOL N/A 05/02/2022   Procedure: COLONOSCOPY WITH PROPOFOL;  Surgeon: Lanelle Bal, DO;  [facility];  Service: Endoscopy;  Laterality: N/A;  11:30 am   ESOPHAGOGASTRODUODENOSCOPY  2011   [place]: non-bleeding erosive gastropathy, normal duodenum. path: unremarkable duodenum, negative H.pylori, minimal esophagitis    ESOPHAGOGASTRODUODENOSCOPY N/A 11/23/2022   Procedure: ESOPHAGOGASTRODUODENOSCOPY (EGD);  Surgeon: Lemar Lofty., MD;  Location: [facility];  Service: Gastroenterology;  Laterality: N/A;   ESOPHAGOGASTRODUODENOSCOPY (EGD) WITH PROPOFOL N/A 05/02/2022   Procedure: ESOPHAGOGASTRODUODENOSCOPY (EGD) WITH PROPOFOL;  Surgeon: Lanelle Bal, DO;  [facility];  Service: Endoscopy;  Laterality: N/A;   EUS N/A 11/23/2022   Procedure: UPPER ENDOSCOPIC ULTRASOUND (EUS) RADIAL;  Surgeon: Lemar Lofty., MD;  [facility];  Service: Gastroenterology;  Laterality: N/A;   EYE SURGERY  2022   POLYPECTOMY  05/02/2022   Procedure: POLYPECTOMY;  Surgeon: Lanelle Bal, DO;  [facility];  Service: Endoscopy;;   REFRACTIVE SURGERY  2024   TONSILLECTOMY  1975    Current Medications: No outpatient medications have been marked as taking for the 04/09/23 encounter (Appointment) with Memory Heinrichs, Rachelle Hora, MD.     Allergies:   Coconut (cocos nucifera), Other, Prednisone, Iodinated contrast media, Petrolatum, and Naproxen   Social History   Socioeconomic History   Marital status: Divorced    Spouse name: Not  on file   Number of children: 2   Years of education: 15   Highest education level: Not on file  Occupational History   Occupation: retired    Comment: Loss adjuster, chartered at a Database administrator  Tobacco Use   Smoking status: Former    Current packs/day: 0.00    Average packs/day: 0.5 packs/day for 15.0 years (7.5 ttl pk-yrs)    Types: Cigarettes    Start date: 06/16/1983    Quit date: 06/15/1998    Years since quitting: 24.8   Smokeless tobacco: Never   Tobacco comments:    quit 2002  Vaping Use   Vaping status: Never Used  Substance and Sexual Activity   Alcohol use: No   Drug use: Never   Sexual activity: Not Currently    Birth control/protection: Surgical, None    Comment: hyst  Other Topics Concern   Not on file  Social History Narrative   Lives alone   2 grown children- one in [place] and one here   Grandchildren       Moved to [place] from [place] for aging mother who is 7 with  Alzheimers      Maternal Aunt is [employer] of the HeLa cancer call line      Enjoy: gym, time with friends       Diet: eats all food groups   Caffeine: 2 diet cokes daily    Water: 2-4 cups daily       Wears seat belt    Does not use phone while driving    Smoke and carbon detectors at home   [employer]    No weapons       Right Handed    Lives alone in a one story home    Retired   International aid/development worker of Health   Financial Resource Strain: Low Risk  (11/22/2022)   Overall Financial Resource Strain (CARDIA)    Difficulty of Paying Living Expenses: Not hard at all  Food Insecurity: No Food Insecurity (11/22/2022)   Hunger Vital Sign    Worried About Running Out of Food in the Last Year: Never true    Ran Out of Food in the Last Year: Never true  Transportation Needs: No Transportation Needs (11/22/2022)   PRAPARE - Administrator, Civil Service (Medical): No    Lack of Transportation (Non-Medical): No  Physical Activity: Sufficiently Active  (11/22/2022)   Exercise Vital Sign    Days of Exercise per Week: 7 days    Minutes of Exercise per Session: 60 min  Stress: No Stress Concern Present (11/22/2022)   [employer] of Occupational Health - Occupational Stress Questionnaire    Feeling of Stress : Not at all  Social Connections: Moderately Isolated (11/22/2022)   Social Connection and Isolation Panel [NHANES]    Frequency of Communication with Friends and Family: More than three times a week    Frequency of Social Gatherings with Friends and Family: More than three times a week    Attends Religious Services: Never    Database administrator or Organizations: Yes    Attends Engineer, structural: 1 to 4 times per year    Marital Status: Divorced     Family History: The patient's family history includes Arthritis in her mother; Breast cancer in her maternal aunt, maternal grandmother, paternal aunt, and paternal aunt; Breast cancer (age of onset: 35) in her mother; COPD in her mother; Cancer in her father, maternal uncle, and another family member; Cancer (age of onset: 42) in her maternal aunt; Hearing loss in her mother; Prostate cancer in her maternal grandfather and maternal uncle; Stroke in her father. There is no history of Colon cancer, Allergic rhinitis, Angioedema, Asthma, Atopy, Eczema, Urticaria, or Immunodeficiency.  ROS:   Please see the history of present illness.     All other systems reviewed and are negative.  EKGs/Labs/Other Studies Reviewed:    The following studies were reviewed today: Echocardiogram 05/04/2020  1. Left ventricular ejection fraction, by estimation, is 60 to 65%. The  left ventricle has normal function. The left ventricle has no regional  wall motion abnormalities. There is mild left ventricular hypertrophy.  Left ventricular diastolic parameters  are indeterminate.   2. Right ventricular systolic function is normal. The right ventricular  size is normal. Tricuspid regurgitation  signal is inadequate for assessing  PA pressure.   3. The mitral valve is grossly normal. Trivial mitral valve  regurgitation.   4. The aortic valve is tricuspid. Aortic valve regurgitation is not  visualized.   5. The inferior vena cava is normal in size with greater than  50%  respiratory variability, suggesting right atrial pressure of 3 mmHg.   Holter monitor July 2019 48-hour Holter monitor reviewed.  Rhythm is sinus throughout.  Heart rate ranged from 55 bpm up to 132 bpm with average heart rate 81 bpm.  There were rare PACs and PVCs without sustained arrhythmia.  No pauses.  No symptom diary for review.  EKG:     EKG Interpretation Date/Time:    Ventricular Rate:    PR Interval:    QRS Duration:    QT Interval:    QTC Calculation:   R Axis:      Text Interpretation:         Previous ECG demonstrates normal sinus rhythm, normal tracing other than a borderline short PR interval just under 120 ms.   Recent Labs: 08/17/2022: Hemoglobin 14.2; Platelets 216 01/16/2023: ALT 23; BUN 16; Creatinine, Ser 0.82; Potassium 4.2; Sodium 139; TSH 0.454  Recent Lipid Panel    Component Value Date/Time   CHOL 222 (H) 01/16/2023 1128   TRIG 85 01/16/2023 1128   HDL 50 01/16/2023 1128   CHOLHDL 4.4 01/16/2023 1128   CHOLHDL 3.0 10/22/2019 0958   VLDL 15 11/24/2016 1018   LDLCALC 157 (H) 01/16/2023 1128   LDLCALC 114 (H) 10/22/2019 0958   12/01/2020 hemoglobin A1c 6.4%  Risk Assessment/Calculations:           Physical Exam:    VS:  There were no vitals taken for this visit.    Wt Readings from Last 3 Encounters:  03/02/23 225 lb 6.4 oz (102.2 kg)  02/15/23 220 lb 6.4 oz (100 kg)  02/13/23 220 lb (99.8 kg)     GEN: Moderate to severely obese, well nourished, well developed in no acute distress HEENT: Normal NECK: No JVD; No carotid bruits LYMPHATICS: No lymphadenopathy CARDIAC: RRR, no murmurs, rubs, gallops RESPIRATORY:  Clear to auscultation without rales, wheezing or  rhonchi  ABDOMEN: Soft, non-tender, non-distended MUSCULOSKELETAL:  No edema; No deformity  SKIN: Warm and dry NEUROLOGIC:  Alert and oriented x 3 PSYCHIATRIC:  Normal affect   ASSESSMENT:    1. PAC (premature atrial contraction)   2. Severe obesity (BMI 35.0-39.9) with comorbidity (HCC)   3. Ductal carcinoma in situ (DCIS) of left breast     PLAN:    In order of problems listed above:  Atrial bigeminy: Rarely symptomatic, benign disorder, no evidence of structural heart disease by echocardiography and no evidence of complex atrial arrhythmia (including no atrial fibrillation) by previous arrhythmia monitor.  Offered reassurance, if she desires she can take beta-blockers as needed, but she prefers not to take more medications. Severe obesity: She has dyslipidemia and borderline diabetes mellitus.  She does not have symptoms of daytime hypersomnolence or other signs of obstructive sleep apnea.  Weight loss is encouraged. Recent treatment for breast cancer including external beam radiation: No symptoms or signs to indicate myocarditis/pericarditis or coronary artery disease.           Medication Adjustments/Labs and Tests Ordered: Current medicines are reviewed at length with the patient today.  Concerns regarding medicines are outlined above.  No orders of the defined types were placed in this encounter.  No orders of the defined types were placed in this encounter.   There are no Patient Instructions on file for this visit.   Signed, Thurmon Fair, MD  04/08/2023 8:24 PM    [facility]

## 2023-04-09 ENCOUNTER — Ambulatory Visit: Payer: Medicare Other | Attending: Cardiovascular Disease | Admitting: Cardiovascular Disease

## 2023-04-09 ENCOUNTER — Encounter: Payer: Self-pay | Admitting: Cardiovascular Disease

## 2023-04-09 VITALS — BP 102/76 | HR 67 | Ht 66.0 in | Wt 221.0 lb

## 2023-04-09 DIAGNOSIS — I491 Atrial premature depolarization: Secondary | ICD-10-CM

## 2023-04-09 DIAGNOSIS — E782 Mixed hyperlipidemia: Secondary | ICD-10-CM

## 2023-04-09 DIAGNOSIS — I872 Venous insufficiency (chronic) (peripheral): Secondary | ICD-10-CM

## 2023-04-09 DIAGNOSIS — G4733 Obstructive sleep apnea (adult) (pediatric): Secondary | ICD-10-CM | POA: Diagnosis not present

## 2023-04-09 DIAGNOSIS — D0512 Intraductal carcinoma in situ of left breast: Secondary | ICD-10-CM | POA: Diagnosis not present

## 2023-04-09 NOTE — Patient Instructions (Signed)
Medication Instructions:  Stop HCTZ *If you need a refill on your cardiac medications before your next appointment, please call your pharmacy*  Testing/Procedures: Dr Royann Shivers has ordered a CT coronary calcium score.   Test locations:  [facility]  [place] [facility]  This is $99 out of pocket.   Coronary CalciumScan A coronary calcium scan is an imaging test used to look for deposits of calcium and other fatty materials (plaques) in the inner lining of the blood vessels of the heart (coronary arteries). These deposits of calcium and plaques can partly clog and narrow the coronary arteries without producing any symptoms or warning signs. This puts a person at risk for a heart attack. This test can detect these deposits before symptoms develop. Tell a health care provider about: Any allergies you have. All medicines you are taking, including vitamins, herbs, eye drops, creams, and over-the-counter medicines. Any problems you or family members have had with anesthetic medicines. Any blood disorders you have. Any surgeries you have had. Any medical conditions you have. Whether you are pregnant or may be pregnant. What are the risks? Generally, this is a safe procedure. However, problems may occur, including: Harm to a pregnant woman and her unborn baby. This test involves the use of radiation. Radiation exposure can be dangerous to a pregnant woman and her unborn baby. If you are pregnant, you generally should not have this procedure done. Slight increase in the risk of cancer. This is because of the radiation involved in the test. What happens before the procedure? No preparation is needed for this procedure. What happens during the procedure? You will undress and remove any jewelry around your neck or chest. You will put on a hospital gown. Sticky electrodes will be placed on your chest. The  electrodes will be connected to an electrocardiogram (ECG) machine to record a tracing of the electrical activity of your heart. A CT scanner will take pictures of your heart. During this time, you will be asked to lie still and hold your breath for 2-3 seconds while a picture of your heart is being taken. The procedure may vary among health care providers and hospitals. What happens after the procedure? You can get dressed. You can return to your normal activities. It is up to you to get the results of your test. Ask your health care provider, or the department that is doing the test, when your results will be ready. Summary A coronary calcium scan is an imaging test used to look for deposits of calcium and other fatty materials (plaques) in the inner lining of the blood vessels of the heart (coronary arteries). Generally, this is a safe procedure. Tell your health care provider if you are pregnant or may be pregnant. No preparation is needed for this procedure. A CT scanner will take pictures of your heart. You can return to your normal activities after the scan is done. This information is not intended to replace advice given to you by your health care provider. Make sure you discuss any questions you have with your health care provider. Document Released: 10/28/2007 Document Revised: 03/20/2016 Document Reviewed: 03/20/2016 Elsevier Interactive Patient Education  2017 [employer].    Follow-Up: At [facility], you and your health needs are our priority.  As part of our continuing mission to provide you with exceptional heart care, we have created designated Provider Care Teams.  These Care Teams include your primary Cardiologist (  physician) and Advanced Practice Providers (APPs -  Physician Assistants and Nurse Practitioners) who all work together to provide you with the care you need, when you need it.  We recommend signing up for the patient portal called "MyChart".  Sign up  information is provided on this After Visit Summary.  MyChart is used to connect with patients for Virtual Visits (Telemedicine).  Patients are able to view lab/test results, encounter notes, upcoming appointments, etc.  Non-urgent messages can be sent to your provider as well.   To learn more about what you can do with MyChart, go to [email or web address].    Your next appointment:   1 year(s)  Provider:   Thurmon Fair, MD

## 2023-04-16 ENCOUNTER — Other Ambulatory Visit: Payer: Self-pay | Admitting: *Deleted

## 2023-04-16 DIAGNOSIS — M7989 Other specified soft tissue disorders: Secondary | ICD-10-CM

## 2023-04-21 DIAGNOSIS — J069 Acute upper respiratory infection, unspecified: Secondary | ICD-10-CM | POA: Diagnosis not present

## 2023-04-21 DIAGNOSIS — E669 Obesity, unspecified: Secondary | ICD-10-CM | POA: Diagnosis not present

## 2023-04-21 DIAGNOSIS — R030 Elevated blood-pressure reading, without diagnosis of hypertension: Secondary | ICD-10-CM | POA: Diagnosis not present

## 2023-04-21 DIAGNOSIS — Z6835 Body mass index (BMI) 35.0-35.9, adult: Secondary | ICD-10-CM | POA: Diagnosis not present

## 2023-04-22 ENCOUNTER — Ambulatory Visit: Payer: Medicare Other

## 2023-04-23 DIAGNOSIS — H40021 Open angle with borderline findings, high risk, right eye: Secondary | ICD-10-CM | POA: Diagnosis not present

## 2023-04-23 DIAGNOSIS — H25811 Combined forms of age-related cataract, right eye: Secondary | ICD-10-CM | POA: Diagnosis not present

## 2023-04-23 DIAGNOSIS — H4720 Unspecified optic atrophy: Secondary | ICD-10-CM | POA: Diagnosis not present

## 2023-04-23 DIAGNOSIS — H524 Presbyopia: Secondary | ICD-10-CM | POA: Diagnosis not present

## 2023-04-23 DIAGNOSIS — H401221 Low-tension glaucoma, left eye, mild stage: Secondary | ICD-10-CM | POA: Diagnosis not present

## 2023-04-30 ENCOUNTER — Encounter: Payer: Self-pay | Admitting: Internal Medicine

## 2023-04-30 ENCOUNTER — Ambulatory Visit (HOSPITAL_COMMUNITY)
Admission: RE | Admit: 2023-04-30 | Discharge: 2023-04-30 | Disposition: A | Discharge status: Home / Self Care | Payer: Medicare Other | Source: Ambulatory Visit | Attending: Surgery | Admitting: Surgery

## 2023-04-30 DIAGNOSIS — M7989 Other specified soft tissue disorders: Secondary | ICD-10-CM | POA: Insufficient documentation

## 2023-05-01 ENCOUNTER — Other Ambulatory Visit: Payer: Self-pay | Admitting: General Surgery

## 2023-05-01 DIAGNOSIS — Z1231 Encounter for screening mammogram for malignant neoplasm of breast: Secondary | ICD-10-CM

## 2023-05-02 ENCOUNTER — Encounter: Payer: Self-pay | Admitting: "Endocrinology

## 2023-05-03 DIAGNOSIS — E890 Postprocedural hypothyroidism: Secondary | ICD-10-CM | POA: Diagnosis not present

## 2023-05-03 DIAGNOSIS — E782 Mixed hyperlipidemia: Secondary | ICD-10-CM | POA: Diagnosis not present

## 2023-05-04 ENCOUNTER — Other Ambulatory Visit: Payer: Self-pay | Admitting: "Endocrinology

## 2023-05-04 LAB — COMPREHENSIVE METABOLIC PANEL
ALT: 17 [IU]/L (ref 0–32)
AST: 19 [IU]/L (ref 0–40)
Albumin: 3.8 g/dL (ref 3.8–4.8)
Alkaline Phosphatase: 155 [IU]/L — ABNORMAL HIGH (ref 44–121)
BUN/Creatinine Ratio: 22 (ref 12–28)
BUN: 16 mg/dL (ref 8–27)
Bilirubin Total: 0.4 mg/dL (ref 0.0–1.2)
CO2: 20 mmol/L (ref 20–29)
Calcium: 9.3 mg/dL (ref 8.7–10.3)
Chloride: 104 mmol/L (ref 96–106)
Creatinine, Ser: 0.74 mg/dL (ref 0.57–1.00)
Globulin, Total: 3 g/dL (ref 1.5–4.5)
Glucose: 116 mg/dL — ABNORMAL HIGH (ref 70–99)
Potassium: 4.5 mmol/L (ref 3.5–5.2)
Sodium: 140 mmol/L (ref 134–144)
Total Protein: 6.8 g/dL (ref 6.0–8.5)
eGFR: 84 mL/min/{1.73_m2} (ref >59)

## 2023-05-04 LAB — T4, FREE: Free T4: 1.23 ng/dL (ref 0.82–1.77)

## 2023-05-04 LAB — TSH: TSH: 7.52 u[IU]/mL — ABNORMAL HIGH (ref 0.450–4.500)

## 2023-05-04 LAB — LIPID PANEL
Chol/HDL Ratio: 3.7 {ratio} (ref 0.0–4.4)
Cholesterol, Total: 213 mg/dL — ABNORMAL HIGH (ref 100–199)
HDL: 58 mg/dL (ref >39)
LDL Chol Calc (NIH): 141 mg/dL — ABNORMAL HIGH (ref 0–99)
Triglycerides: 80 mg/dL (ref 0–149)
VLDL Cholesterol Cal: 14 mg/dL (ref 5–40)

## 2023-05-04 MED ORDER — SYNTHROID 100 MCG PO TABS: 100.0000 ug | ORAL_TABLET | Freq: Every day | ORAL | 1 refills | Status: DC

## 2023-05-07 ENCOUNTER — Telehealth: Payer: Self-pay

## 2023-05-07 ENCOUNTER — Other Ambulatory Visit: Payer: Self-pay

## 2023-05-07 DIAGNOSIS — E890 Postprocedural hypothyroidism: Secondary | ICD-10-CM

## 2023-05-07 NOTE — Telephone Encounter (Signed)
Spoke with pt, advising her the main factor in decision making is her Free T4 level which is within target for her, that we only want to make one change at a time and she can have her labs checked in 4-6 weeks from starting the dose of levothyroxine and adjustments will be made according to those measurements per Dr.Nida. Pt voiced understanding.

## 2023-05-07 NOTE — Telephone Encounter (Signed)
-----   Message from Marquis Lunch sent at 05/04/2023  4:57 PM EST ----- The main factor in decision making is the Free T4 which is in target for her.  Also, we make one change at time and adjust based on next measurement, so 100 is good for now. She can certainly have her labs done 4-6 weeks on the . ----- Message ----- From: Derrell Lolling, LPN Sent: [id number]  12:03 PM EST To: Roma Kayser, MD  Spoke with pt advising her to increase her levothyroxine to . Pt requested that you look back at her labs from last year when she was taking the . She is concerned that the dose is not enough. She has an appt in February. Do you want her to have labs any sooner than a week prior to that appt? ----- Message ----- From: Roma Kayser, MD Sent: 05/04/2023  11:15 AM EST To: Derrell Lolling, LPN  Mikyle Sox, Would you please call patient to pick up a higher dose of levothyroxine -100 mcg?  Thanks. ----- Message ----- From: Nell Range Lab Results In Sent: 05/04/2023   5:37 AM EST To: Roma Kayser, MD

## 2023-05-10 ENCOUNTER — Encounter: Payer: Self-pay | Admitting: Internal Medicine

## 2023-05-23 ENCOUNTER — Ambulatory Visit (HOSPITAL_COMMUNITY)
Admission: RE | Admit: 2023-05-23 | Discharge: 2023-05-23 | Disposition: A | Discharge status: Home / Self Care | Payer: Medicare Other | Source: Ambulatory Visit | Attending: Cardiovascular Disease | Admitting: Cardiovascular Disease

## 2023-05-23 DIAGNOSIS — E782 Mixed hyperlipidemia: Secondary | ICD-10-CM | POA: Insufficient documentation

## 2023-05-24 ENCOUNTER — Telehealth (INDEPENDENT_AMBULATORY_CARE_PROVIDER_SITE_OTHER): Payer: Medicare Other | Admitting: Internal Medicine

## 2023-05-24 ENCOUNTER — Encounter: Payer: Self-pay | Admitting: Internal Medicine

## 2023-05-24 VITALS — Ht 66.0 in | Wt 219.0 lb

## 2023-05-24 DIAGNOSIS — Z6830 Body mass index (BMI) 30.0-30.9, adult: Secondary | ICD-10-CM | POA: Diagnosis not present

## 2023-05-24 DIAGNOSIS — M7989 Other specified soft tissue disorders: Secondary | ICD-10-CM | POA: Diagnosis not present

## 2023-05-24 DIAGNOSIS — E890 Postprocedural hypothyroidism: Secondary | ICD-10-CM

## 2023-05-24 DIAGNOSIS — E669 Obesity, unspecified: Secondary | ICD-10-CM | POA: Insufficient documentation

## 2023-05-24 NOTE — Progress Notes (Signed)
 Virtual Visit via Video Note   Because of Kara [name]'s co-morbid illnesses, she is at least at moderate risk for complications without adequate follow up.  This format is felt to be most appropriate for this patient at this time.  All issues noted in this document were discussed and addressed.  A limited physical exam was performed with this format.      Evaluation Performed:  Follow-up visit  Date:  05/24/2023   ID:  Kara [name], DOB Jul 07, 1945, MRN [id number]  Patient Location: Home Provider Location: Office/Clinic  Participants: Patient Location of Patient: Home Location of Provider: Telehealth Consent was obtain for visit to be over via telehealth. I verified that I am speaking with the correct person using two identifiers.  PCP:  Tobie Suzzane POUR, MD   Chief Complaint: Leg swelling  History of Present Illness:    Kara [name] is a 78 y.o. female with PMH of GERD and hypothyroidism who has a video visit for recent worsening of leg swelling. She was told to stop hydrochlorothiazide  recently by Cardiologist in 11/24. She has noticed worsening of leg swelling since then. She uses sequential compression device twice in a week, and feels better after using it. She does not tolerate other diuretics, has hypotension. She has been referred to Vascular surgery by Cardiology for consideration of saphenous vein ablation.  She has had dose change in Levothyroxine  recently. She was concerned whether it was causing leg swelling. I reassured her that her pitting edema is more likely due to venous insufficiency rather than hypothyroidism.  The patient does not have symptoms concerning for COVID-19 infection (fever, chills, cough, or new shortness of breath).   Past Medical, Surgical, Social History, Allergies, and Medications have been Reviewed.  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  The STAT Breast cancer panel offered by [facility] includes sequencing and rearrangement analysis for the following 9 genes:  ATM, BRCA1, BRCA2, CDH1, CHEK2, PALB2, PTEN, STK11 and TP53.   The report date is August 09, 2019.   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 fibroids   BIOPSY  05/02/2022   Procedure: BIOPSY;  Surgeon: Cindie Carlin POUR, DO;  Location: [facility];  Service: Endoscopy;;   BREAST BIOPSY Left 05/2019   left breast DCIS   BREAST LUMPECTOMY Left 06/2019   DCIS   BREAST LUMPECTOMY WITH RADIOACTIVE SEED LOCALIZATION Left 07/08/2019   Procedure: LEFT BREAST LUMPECTOMY WITH  RADIOACTIVE SEED LOCALIZATION;  Surgeon: Curvin Deward MOULD, MD;  Location: [facility];  Service: General;  Laterality: Left;   BREAST SURGERY N/A    [employer]reesia 08/30/2020   CESAREAN SECTION     CESAREAN SECTION N/A    [employer] 08/30/2020   CHOLECYSTECTOMY     COLONOSCOPY  2011   [place] : two 3-4 mm sessile polyps, hyperplastic, sigmoid diverticula, TI appeared normal.    COLONOSCOPY WITH PROPOFOL  N/A 05/02/2022   Procedure: COLONOSCOPY WITH PROPOFOL ;  Surgeon: Cindie Carlin POUR, DO;  Location: [facility];  Service: Endoscopy;  Laterality: N/A;  11:30 am   ESOPHAGOGASTRODUODENOSCOPY  2011   [place] : non-bleeding erosive gastropathy, normal duodenum. path: unremarkable duodenum, negative H.pylori, minimal esophagitis    ESOPHAGOGASTRODUODENOSCOPY N/A 11/23/2022   Procedure: ESOPHAGOGASTRODUODENOSCOPY (EGD);  Surgeon: Wilhelmenia Aloha Raddle., MD;  Location: [facility];  Service: Gastroenterology;  Laterality: N/A;   ESOPHAGOGASTRODUODENOSCOPY (EGD) WITH PROPOFOL  N/A 05/02/2022   Procedure: ESOPHAGOGASTRODUODENOSCOPY (EGD) WITH PROPOFOL ;  Surgeon: Cindie Carlin POUR, DO;  Location: [facility];  Service: Endoscopy;  Laterality: N/A;   EUS N/A 11/23/2022   Procedure: UPPER ENDOSCOPIC ULTRASOUND (EUS) RADIAL;  Surgeon: Wilhelmenia Aloha Raddle., MD;  Location: [facility];  Service: Gastroenterology;  Laterality: N/A;   EYE SURGERY  2022   POLYPECTOMY  05/02/2022   Procedure: POLYPECTOMY;  Surgeon: Cindie Carlin POUR, DO;  Location: [facility];  Service: Endoscopy;;   REFRACTIVE SURGERY  2024   TONSILLECTOMY  1975     Current Meds  Medication Sig   acetaminophen  (TYLENOL ) 500 MG tablet Take 500 mg by mouth every 8 (eight) hours as needed for moderate pain (pain score 4-6).   albuterol  (VENTOLIN  HFA) 108 (90 Base) MCG/ACT inhaler Inhale 1 puff into the lungs every 6 (six) hours as needed for wheezing or shortness of breath.   Carboxymethylcell-Glycerin PF (REFRESH RELIEVA  PF) 0.5-1 % SOLN Place 1 drop into both eyes in the morning, at noon, in the evening, and at bedtime.   hydrochlorothiazide  (HYDRODIURIL ) 12.5 MG tablet Take 12.5 mg by mouth as needed (Leg swelling).   latanoprost (XALATAN) 0.005 % ophthalmic solution Place 1 drop into both eyes at bedtime.   Misc Natural Products (SAMBUCUS ELDERBERRY VITAMIN C MT) Take 1 each by mouth daily. Gummy   Misc. Devices MISC Compression stockings XL: 20-30 mm Hg   SYNTHROID  100 MCG tablet Take 1 tablet (100 mcg total) by mouth daily before breakfast.   Thiamine  HCl (VITAMIN B-1 PO) Take 1 tablet by mouth in the morning.     Allergies:   Coconut (cocos nucifera), Other, Prednisone , Iodinated contrast media, Petrolatum, and Naproxen    ROS:   Please see the history of present illness. All other systems reviewed and are negative.   Labs/Other Tests and Data Reviewed:    Recent Labs: 08/17/2022: Hemoglobin 14.2; Platelets 216 05/03/2023: ALT 17; BUN 16; Creatinine, Ser 0.74; Potassium 4.5; Sodium 140; TSH 7.520   Recent Lipid Panel Lab Results  Component Value  Date/Time   CHOL 213 (H) 05/03/2023 09:26 AM   TRIG 80 05/03/2023 09:26 AM   HDL 58 05/03/2023 09:26 AM   CHOLHDL 3.7 05/03/2023 09:26 AM   CHOLHDL 3.0 10/22/2019 09:58 AM   LDLCALC 141 (H) 05/03/2023 09:26 AM   LDLCALC 114 (H) 10/22/2019 09:58 AM    Wt Readings from Last 3 Encounters:  05/24/23 219 lb (99.3 kg)  04/09/23 221 lb (100.2 kg)  03/02/23 225 lb 6.4 oz (102.2 kg)     Objective:    Vital Signs:  Ht 5' 6 (1.676 m)   Wt 219 lb (99.3 kg)   BMI 35.35 kg/m    VITAL SIGNS:  reviewed GEN:  no acute distress EYES:  sclerae anicteric, EOMI - Extraocular Movements Intact RESPIRATORY:  normal respiratory effort, symmetric expansion NEURO:  alert and oriented x 3, no obvious focal deficit PSYCH:  normal affect  ASSESSMENT & PLAN:    Leg swelling Likely related to venous insufficiency Echo in 2021 showed LVEF of 60-65%. Leg  elevation and compression socks Referred to PT, but had groin pain Uses compression device at home, it helps - uses it twice a week, needs to use it at least 3 times in a week Used to take hydrochlorothiazide  12.5 mg every other day, advised to restart it for now until Vascular surgery evaluation  Hypothyroidism following radioiodine therapy Lab Results  Component Value Date   TSH 7.520 (H) 05/03/2023   On Levothyroxine  100 mcg QD, f/u with Dr Lenis  Obesity (BMI 30-39.9) BMI Readings from Last 3 Encounters:  05/24/23 35.35 kg/m  04/09/23 35.67 kg/m  03/02/23 36.38 kg/m   Associated with OSA and HLD Has chronic leg swelling - would benefit from weight loss She is a good candidate for GLP1 agonist therapy - but insurance coverage is a concern   I discussed the assessment and treatment plan with the patient. The patient was provided an opportunity to ask questions, and all were answered. The patient agreed with the plan and demonstrated an understanding of the instructions.   The patient was advised to call back or seek an in-person evaluation if the symptoms worsen or if the condition fails to improve as anticipated.  The above assessment and management plan was discussed with the patient. The patient verbalized understanding of and has agreed to the management plan.   Medication Adjustments/Labs and Tests Ordered: Current medicines are reviewed at length with the patient today.  Concerns regarding medicines are outlined above.   Tests Ordered: No orders of the defined types were placed in this encounter.   Medication Changes: No orders of the defined types were placed in this encounter.    Note: This dictation was prepared with Dragon dictation along with smaller phrase technology. Similar sounding words can be transcribed inadequately or may not be corrected upon review. Any transcriptional errors that result from this process are unintentional.      Disposition:   Follow up  Signed, Suzzane MARLA Blanch, MD  05/24/2023 9:02 AM     [facility]

## 2023-05-24 NOTE — Patient Instructions (Signed)
 Please start taking hydrochlorothiazide every other day for leg swelling until Vascular surgery evaluation.  Continue to follow low salt diet and perform leg elevation as tolerated.

## 2023-05-24 NOTE — Assessment & Plan Note (Signed)
 BMI Readings from Last 3 Encounters:  05/24/23 35.35 kg/m  04/09/23 35.67 kg/m  03/02/23 36.38 kg/m   Associated with OSA and HLD Has chronic leg swelling - would benefit from weight loss She is a good candidate for GLP1 agonist therapy - but insurance coverage is a concern

## 2023-05-24 NOTE — Assessment & Plan Note (Signed)
 Lab Results  Component Value Date   TSH 7.520 (H) 05/03/2023   On Levothyroxine 100 mcg QD, f/u with Dr Fransico Him

## 2023-05-24 NOTE — Assessment & Plan Note (Addendum)
 Likely related to venous insufficiency Echo in 2021 showed LVEF of 60-65%. Leg elevation and compression socks Referred to PT, but had groin pain Uses compression device at home, it helps - uses it twice a week, needs to use it at least 3 times in a week Used to take hydrochlorothiazide  12.5 mg every other day, advised to restart it for now until Vascular surgery evaluation

## 2023-05-28 ENCOUNTER — Encounter: Payer: Self-pay | Admitting: Cardiovascular Disease

## 2023-06-05 ENCOUNTER — Ambulatory Visit (INDEPENDENT_AMBULATORY_CARE_PROVIDER_SITE_OTHER): Payer: Medicare Other | Admitting: Orthopedic Surgery

## 2023-06-05 ENCOUNTER — Encounter: Payer: Self-pay | Admitting: Orthopedic Surgery

## 2023-06-05 VITALS — BP 102/68 | HR 84 | Ht 66.0 in | Wt 222.0 lb

## 2023-06-05 DIAGNOSIS — M25561 Pain in right knee: Secondary | ICD-10-CM | POA: Diagnosis not present

## 2023-06-05 DIAGNOSIS — G8929 Other chronic pain: Secondary | ICD-10-CM

## 2023-06-05 NOTE — Progress Notes (Signed)
New Patient Visit  Assessment: Kara [name] is a 78 y.o. female with the following: 1. Chronic pain of right knee; mild to moderate arthritis  Plan: Joaquin Music continues to have pain in the right knee.  Pain is diffuse.  No specific injury.  We reviewed radiographs in clinic today, which demonstrates mild to moderate degenerative changes.  Description of her pain is consistent with arthritis.  I have recommended a steroid injection.  She is interested.  This was completed in clinic today.  She will return to clinic as needed.  Continue to take medicines such as acetaminophen or ibuprofen as needed  Procedure note injection Right knee joint   Verbal consent was obtained to inject the right knee joint  Timeout was completed to confirm the site of injection.  The skin was prepped with alcohol and ethyl chloride was sprayed at the injection site.  A 21-gauge needle was used to inject 40 mg of Depo-Medrol and 1% lidocaine (4 cc) into the right knee using an anterolateral approach.  There were no complications. A sterile bandage was applied.    Follow-up: Return if symptoms worsen or fail to improve.  Subjective:  Chief Complaint  Patient presents with   Knee Pain    R knee pain getting worse.     History of Present Illness: Kara [name] is a 78 y.o. female who returns for evaluation of right knee pain.  I saw her in clinic several months ago.  At that time, she was not interested in an injection.  She has been taking medicines occasionally.  Compression sleeve was not comfortable, due to wearing compression stockings.  She complains of pain in the medial aspect of the knee.  Pain gets worse at night.  She notes pain radiating distally, especially at night.  Review of Systems: No fevers or chills No numbness or tingling No chest pain No shortness of breath No bowel or bladder dysfunction No GI distress No headaches    Objective: BP 102/68   Pulse 84   Ht 5\' 6"   (1.676 m)   Wt 222 lb (100.7 kg)   BMI 35.83 kg/m   Physical Exam:  General: Alert and oriented. and No acute distress. Gait: Right sided antalgic gait.  Bilateral lower extremities with diffuse swelling.  No redness.  Mild effusion of the right knee.  She has good range of motion.  Tenderness to palpation along the medial joint line.  Mild tenderness to palpation within the posterior knee.  Negative Lachman.  No increased laxity varus valgus stress.  IMAGING: No new imaging obtained today    New Medications:  No orders of the defined types were placed in this encounter.     Oliver Barre, MD  06/05/2023 9:46 AM

## 2023-06-05 NOTE — Patient Instructions (Signed)

## 2023-06-10 ENCOUNTER — Encounter: Payer: Self-pay | Admitting: Internal Medicine

## 2023-06-11 ENCOUNTER — Other Ambulatory Visit: Payer: Self-pay | Admitting: Internal Medicine

## 2023-06-11 DIAGNOSIS — E559 Vitamin D deficiency, unspecified: Secondary | ICD-10-CM

## 2023-06-11 DIAGNOSIS — E519 Thiamine deficiency, unspecified: Secondary | ICD-10-CM

## 2023-06-13 ENCOUNTER — Ambulatory Visit (INDEPENDENT_AMBULATORY_CARE_PROVIDER_SITE_OTHER): Payer: Medicare Other | Admitting: Internal Medicine

## 2023-06-13 ENCOUNTER — Encounter: Payer: Self-pay | Admitting: Internal Medicine

## 2023-06-13 VITALS — BP 106/67 | HR 69 | Temp 97.5°F | Ht 66.0 in | Wt 224.8 lb

## 2023-06-13 DIAGNOSIS — R748 Abnormal levels of other serum enzymes: Secondary | ICD-10-CM

## 2023-06-13 DIAGNOSIS — R1011 Right upper quadrant pain: Secondary | ICD-10-CM

## 2023-06-13 DIAGNOSIS — R195 Other fecal abnormalities: Secondary | ICD-10-CM | POA: Diagnosis not present

## 2023-06-13 DIAGNOSIS — E890 Postprocedural hypothyroidism: Secondary | ICD-10-CM | POA: Diagnosis not present

## 2023-06-13 DIAGNOSIS — E519 Thiamine deficiency, unspecified: Secondary | ICD-10-CM | POA: Diagnosis not present

## 2023-06-13 DIAGNOSIS — K219 Gastro-esophageal reflux disease without esophagitis: Secondary | ICD-10-CM

## 2023-06-13 DIAGNOSIS — R7989 Other specified abnormal findings of blood chemistry: Secondary | ICD-10-CM

## 2023-06-13 DIAGNOSIS — E559 Vitamin D deficiency, unspecified: Secondary | ICD-10-CM | POA: Diagnosis not present

## 2023-06-13 DIAGNOSIS — K8689 Other specified diseases of pancreas: Secondary | ICD-10-CM | POA: Diagnosis not present

## 2023-06-13 NOTE — Progress Notes (Signed)
Referring Provider: Anabel Halon, MD Primary Care Physician:  Anabel Halon, MD Primary GI:  Dr. Marletta Lor  Chief Complaint  Patient presents with   Abdominal Pain    Follow up on RUQ pain and elevated liver enzymes. Patient states pain is the same as last visit.     HPI:   Kara [name] is a 78 y.o. female who presents to clinic today for follow up visit. She has a history of abdominal pain, GERD, adenomatous colon polyps.  Extensive workup for her chronic right upper quadrant abdominal pain and chronically elevated alkaline phosphatase level as below:  EGD 04/2022: -2cm hh -mild Schatzki ring -gastritis, reactive gastropathy, no H.pylori -congested ampulla, mild acute duodenitis   Colonoscopy 04/2022: -diverticulosis -five polyps removed, tubular adenomas -Recall 3 years  EUS 11/23/2022 -Normal CBD, no choledocho, normal ampulla, normal pancreatic duct, ?fatty pancreas, multiple liver cysts.  CT abdomen pelvis with contrast 08/24/2022 mild pelvic lymphadenopathy bilateral iliac regions consider follow-up CT versus PET/CT or tissue sampling.  Followed up with her oncologist who recommended repeat CT in 3 months  CT chest abdomen pelvis with contrast 12/07/2022 overall unremarkable, lymphadenopathy resolved.  Multiple liver cysts, largest 5 cm  Elevated alk phos: GGT elevated at 74, lipase 29, AMA negative  She has had chronic intermittent right upper quadrant pain off and on for years.  Worse with certain foods.  Sore to touch, burning in quality.  Pepcid helped a little previously.  Gallbladder removed remotely for this pain but did not make it any better.  Took pantoprazole without improvement.  Pain sometimes sharp, sometimes pressure.  Occurs every day  Also notes chronically loose stools, 1-2 bowel movements per day.  Has not had a formed stool in over a year.  Was given samples of pancreatic enzymes on previous visit.  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  The STAT Breast cancer panel offered by [facility] includes sequencing and rearrangement analysis for the following 9 genes:  ATM, BRCA1, BRCA2, CDH1, CHEK2, PALB2, PTEN, STK11 and TP53.   The report date is August 09, 2019.   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 fibroids   BIOPSY  05/02/2022   Procedure: BIOPSY;  Surgeon: Lanelle Bal, DO;  Location: [facility];  Service: Endoscopy;;   BREAST BIOPSY Left 05/2019    left breast DCIS   BREAST LUMPECTOMY Left 06/2019   DCIS   BREAST LUMPECTOMY WITH RADIOACTIVE SEED LOCALIZATION Left 07/08/2019   Procedure: LEFT BREAST LUMPECTOMY WITH RADIOACTIVE SEED LOCALIZATION;  Surgeon: Griselda Miner, MD;  Location: [facility];  Service: General;  Laterality: Left;   BREAST SURGERY N/A    [employer] 08/30/2020   CESAREAN SECTION     CESAREAN SECTION N/A    [employer] 08/30/2020   CHOLECYSTECTOMY     COLONOSCOPY  2011   [place]: two 3-4 mm sessile polyps, hyperplastic, sigmoid diverticula, TI appeared normal.    COLONOSCOPY WITH PROPOFOL N/A 05/02/2022   Procedure: COLONOSCOPY WITH PROPOFOL;  Surgeon: Lanelle Bal, DO;  Location: [facility];  Service: Endoscopy;  Laterality: N/A;  11:30 am   ESOPHAGOGASTRODUODENOSCOPY  2011   [place]: non-bleeding erosive gastropathy, normal duodenum. path: unremarkable duodenum, negative H.pylori, minimal esophagitis    ESOPHAGOGASTRODUODENOSCOPY N/A 11/23/2022   Procedure: ESOPHAGOGASTRODUODENOSCOPY (EGD);  Surgeon: Lemar Lofty., MD;  Location: [facility];  Service: Gastroenterology;  Laterality: N/A;   ESOPHAGOGASTRODUODENOSCOPY (EGD) WITH PROPOFOL N/A 05/02/2022   Procedure: ESOPHAGOGASTRODUODENOSCOPY (EGD) WITH PROPOFOL;  Surgeon: Lanelle Bal, DO;  Location: [facility];  Service: Endoscopy;  Laterality: N/A;   EUS N/A 11/23/2022   Procedure: UPPER ENDOSCOPIC ULTRASOUND (EUS) RADIAL;  Surgeon: Lemar Lofty., MD;  Location: [facility];  Service: Gastroenterology;  Laterality: N/A;   EYE SURGERY  2022   POLYPECTOMY  05/02/2022   Procedure: POLYPECTOMY;  Surgeon: Lanelle Bal, DO;  Location: [facility];  Service: Endoscopy;;   REFRACTIVE SURGERY  2024   TONSILLECTOMY  1975    Current Outpatient Medications  Medication Sig Dispense Refill   acetaminophen (TYLENOL) 500 MG tablet Take 500 mg by mouth every 8 (eight) hours as needed for moderate pain (pain score  4-6).     albuterol (VENTOLIN HFA) 108 (90 Base) MCG/ACT inhaler Inhale 1 puff into the lungs every 6 (six) hours as needed for wheezing or shortness of breath. 6.7 g 2   Carboxymethylcell-Glycerin PF (REFRESH RELIEVA PF) 0.5-1 % SOLN Place 1 drop into both eyes in the morning, at noon, in the evening, and at bedtime.     hydrochlorothiazide (HYDRODIURIL) 12.5 MG tablet TAKE 1 TABLET(12.5 MG) BY MOUTH DAILY 30 tablet 0   latanoprost (XALATAN) 0.005 % ophthalmic solution Place 1 drop into both eyes at bedtime.     Misc Natural Products (SAMBUCUS ELDERBERRY VITAMIN C MT) Take 1 each by mouth daily. Gummy     Misc. Devices MISC Compression stockings XL: 20-30 mm Hg 1 each 0   SYNTHROID 100 MCG tablet Take 1 tablet (100 mcg total) by mouth daily before breakfast. 90 tablet 1   Thiamine HCl (VITAMIN B-1 PO) Take 1 tablet by mouth in the morning.     No current facility-administered medications for this visit.    Allergies as of 06/13/2023 - Review Complete 06/13/2023  Allergen Reaction Noted   Coconut (cocos nucifera)  Anaphylaxis 06/30/2020   Other Shortness Of Breath 01/20/2011   Prednisone Hypertension 12/03/2018   Iodinated contrast media Swelling 12/04/2022   Petrolatum Dermatitis 04/28/2022   Naproxen Other (See Comments) 11/09/2015    Family History  Problem Relation Age of Onset   Breast cancer Maternal Grandmother        dx over 55   Prostate cancer Maternal Grandfather    Arthritis Mother    COPD Mother    Breast cancer Mother 60       second at age 67   Hearing loss Mother    Cancer Father        throat   Stroke Father    Cancer Maternal Aunt 1       [name], cervical cancer   Breast cancer Maternal Aunt        dx over 18   Breast cancer Paternal Aunt        dx under 50   Prostate cancer Maternal Uncle        dx over 94   Cancer Other        father's maternal cousin was Henreietta Lacks, Cervical   Cancer Maternal Uncle        unknown cancer   Breast  cancer Paternal Aunt        dx over 7   Colon cancer Neg Hx    Allergic rhinitis Neg Hx    Angioedema Neg Hx    Asthma Neg Hx    Atopy Neg Hx    Eczema Neg Hx    Urticaria Neg Hx    Immunodeficiency Neg Hx     Social History   Socioeconomic History   Marital status: Divorced    Spouse name: Not on file   Number of children: 2   Years of education: 15   Highest education level: Not on file  Occupational History   Occupation: retired    Comment: Loss adjuster, chartered at a [employer]  Tobacco Use   Smoking status: Former    Current packs/day: 0.00    Average packs/day: 0.5 packs/day for 15.0 years (7.5 ttl pk-yrs)    Types: Cigarettes    Start date: 06/16/1983    Quit date: 06/15/1998    Years since quitting: 25.0   Smokeless tobacco: Never   Tobacco comments:    quit 2002  Vaping Use   Vaping status: Never Used  Substance and Sexual Activity   Alcohol use: No   Drug use: Never   Sexual activity: Not Currently    Birth control/protection: Surgical, None    Comment: hyst  Other Topics Concern   Not on file  Social History Narrative   Lives alone   2 grown children- one in [place] and one here   Grandchildren       Moved to [place] from [place] for aging mother who is 49 with Alzheimers      Maternal Aunt is Animator of the [employer] cancer call line      Enjoy: gym, time with friends       Diet: eats all food groups   Caffeine: 2 diet cokes daily    Water: 2-4 cups daily       Wears seat belt    Does not use phone while driving    Smoke and carbon detectors at home   [employer]    No weapons       Right Handed    Lives alone in  a one story home    Retired   Teacher, 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 Risk  (11/22/2022)   Overall Financial Resource Strain (CARDIA)    Difficulty of Paying Living Expenses: Not hard at all  Food Insecurity: No Food Insecurity (11/22/2022)   Hunger Vital Sign    Worried About  Running Out of Food in the Last Year: Never true    Ran Out of Food in the Last Year: Never true  Transportation Needs: No Transportation Needs (11/22/2022)   PRAPARE - Administrator, Civil Service (Medical): No    Lack of Transportation (Non-Medical): No  Physical Activity: Sufficiently Active (11/22/2022)   Exercise Vital Sign    Days of Exercise per Week: 7 days    Minutes of Exercise per Session: 60 min  Stress: No Stress Concern Present (11/22/2022)   [employer] of Occupational Health - Occupational Stress Questionnaire    Feeling of Stress : Not at all  Social Connections: Moderately Isolated (11/22/2022)   Social Connection and Isolation Panel [NHANES]    Frequency of Communication with Friends and Family: More than three times a week    Frequency of Social Gatherings with Friends and Family: More than three times a week    Attends Religious Services: Never    Database administrator or Organizations: Yes    Attends Banker Meetings: 1 to 4 times per year    Marital Status: Divorced    Subjective: Review of Systems  Constitutional:  Negative for chills and fever.  HENT:  Negative for congestion and hearing loss.   Eyes:  Negative for blurred vision and double vision.  Respiratory:  Negative for cough and shortness of breath.   Cardiovascular:  Negative for chest pain and palpitations.  Gastrointestinal:  Positive for abdominal pain. Negative for blood in stool, constipation, diarrhea, heartburn, melena and vomiting.  Genitourinary:  Negative for dysuria and urgency.  Musculoskeletal:  Negative for joint pain and myalgias.  Skin:  Negative for itching and rash.  Neurological:  Negative for dizziness and headaches.  Psychiatric/Behavioral:  Negative for depression. The patient is not nervous/anxious.      Objective: BP 106/67   Pulse 69   Temp (!) 97.5 F (36.4 C)   Ht 5\' 6"  (1.676 m)   Wt 224 lb 12.8 oz (102 kg)   BMI 36.28 kg/m   Physical Exam Constitutional:      Appearance: Normal appearance.  HENT:     Head: Normocephalic and atraumatic.  Eyes:     Extraocular Movements: Extraocular movements intact.     Conjunctiva/sclera: Conjunctivae normal.  Cardiovascular:     Rate and Rhythm: Normal rate and regular rhythm.  Pulmonary:     Effort: Pulmonary effort is normal.     Breath sounds: Normal breath sounds.  Abdominal:     General: Bowel sounds are normal.     Palpations: Abdomen is soft.  Musculoskeletal:        General: No swelling. Normal range of motion.     Cervical back: Normal range of motion and neck supple.  Skin:    General: Skin is warm and dry.     Coloration: Skin is not jaundiced.  Neurological:     General: No focal deficit present.     Mental Status: She is alert and oriented to person, place, and time.  Psychiatric:        Mood and Affect: Mood normal.  Behavior: Behavior normal.      Assessment: *Right upper quadrant pain-chronic *Elevated alk phos *Chronic GERD *Chronically loose stools  Plan: Etiology of her ongoing right upper quadrant pain unclear.  Not improved with pantoprazole or Pepcid.  EUS largely unremarkable.  Possibly her liver cysts are playing a role in her pain.  Offered IR referral for drainage of the 5 cm cyst to see if this helps with her pain.  At the same time could potentially perform liver biopsy given her chronically elevated alk phos. she is agreeable to this plan.  I discussed further with IR who did state it would be possible to have both of these procedures done at the same time. Will refer today  Offered to trial different PPI such as dexlansoprazole, she is not interested in this.  Possibly she has functional abdominal pain.  Discussed trial of low-dose TCA.  She is not interested in this.  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  Trial of pancreatic enzymes did seem to  help though patient states it was difficult for her to remember to take these medications as instructed.   Follow-up in 3 months  06/13/2023 11:36 AM   Disclaimer: This note was dictated with voice recognition software. Similar sounding words can inadvertently be transcribed and may not be corrected upon review.

## 2023-06-13 NOTE — Patient Instructions (Signed)
I am going to reach out to interventional radiology to see if we can get you set up for a liver biopsy as well as cyst drainage to be performed at the same time.  Once we have heard back, we will likely make the referral for you to meet with them.  Otherwise follow-up in 3 months or sooner if needed.  It was very nice seeing you again today.  Dr. Marletta Lor

## 2023-06-14 ENCOUNTER — Other Ambulatory Visit: Payer: Self-pay | Admitting: "Endocrinology

## 2023-06-14 ENCOUNTER — Telehealth: Payer: Self-pay | Admitting: "Endocrinology

## 2023-06-14 ENCOUNTER — Other Ambulatory Visit: Payer: Self-pay | Admitting: *Deleted

## 2023-06-14 DIAGNOSIS — R1011 Right upper quadrant pain: Secondary | ICD-10-CM

## 2023-06-14 DIAGNOSIS — E890 Postprocedural hypothyroidism: Secondary | ICD-10-CM

## 2023-06-14 DIAGNOSIS — R7989 Other specified abnormal findings of blood chemistry: Secondary | ICD-10-CM

## 2023-06-14 DIAGNOSIS — K7689 Other specified diseases of liver: Secondary | ICD-10-CM

## 2023-06-14 DIAGNOSIS — E782 Mixed hyperlipidemia: Secondary | ICD-10-CM

## 2023-06-14 LAB — TSH: TSH: 2.51 u[IU]/mL (ref 0.450–4.500)

## 2023-06-14 LAB — T4, FREE: Free T4: 1.74 ng/dL (ref 0.82–1.77)

## 2023-06-14 NOTE — Telephone Encounter (Signed)
Pt did her thyroid labs for her March 11 appt. Do you want her to repeat one week before that appt? If so, please add and I will let pt know

## 2023-06-15 ENCOUNTER — Ambulatory Visit
Admission: RE | Admit: 2023-06-15 | Discharge: 2023-06-15 | Disposition: A | Discharge status: Home / Self Care | Payer: Medicare Other | Source: Ambulatory Visit | Attending: General Surgery | Admitting: General Surgery

## 2023-06-15 DIAGNOSIS — Z1231 Encounter for screening mammogram for malignant neoplasm of breast: Secondary | ICD-10-CM

## 2023-06-18 ENCOUNTER — Encounter: Payer: Self-pay | Admitting: "Endocrinology

## 2023-06-18 LAB — VITAMIN D 25 HYDROXY (VIT D DEFICIENCY, FRACTURES): Vit D, 25-Hydroxy: 40.8 ng/mL (ref 30.0–100.0)

## 2023-06-18 LAB — VITAMIN B1: Thiamine: 109.2 nmol/L (ref 66.5–200.0)

## 2023-06-20 ENCOUNTER — Ambulatory Visit
Admission: RE | Admit: 2023-06-20 | Discharge: 2023-06-20 | Disposition: A | Discharge status: Home / Self Care | Payer: Medicare Other | Source: Ambulatory Visit | Attending: Internal Medicine

## 2023-06-20 ENCOUNTER — Other Ambulatory Visit: Payer: Self-pay | Admitting: Interventional Radiology

## 2023-06-20 DIAGNOSIS — R7989 Other specified abnormal findings of blood chemistry: Secondary | ICD-10-CM

## 2023-06-20 DIAGNOSIS — K7689 Other specified diseases of liver: Secondary | ICD-10-CM | POA: Diagnosis not present

## 2023-06-20 DIAGNOSIS — R1011 Right upper quadrant pain: Secondary | ICD-10-CM | POA: Diagnosis not present

## 2023-06-20 HISTORY — PX: IR RADIOLOGIST EVAL & MGMT: IMG5224

## 2023-06-20 NOTE — Consult Note (Signed)
 Chief Complaint:  Chronic right quadrant abdominal pain elevated LFTs  Referring Physician(s): Carver,Charles K  History of Present Illness: Kara [name] is a 78 y.o. female Who was seen by Dr. Cindie with [facility] in [place].  She has a long history of chronic intermittent right  abdominal pain and chronically elevated alkaline phosphatase levels.  CT from 12/07/2022 demonstrates several hypodense liver cysts.  There is 1 particular right upper anterior left liver cyst that is slightly exophytic and may stretch the overlying liver capsule.  This cyst measures 5.8 cm in greatest dimension.  She describes chronic intermittent right upper quadrant bandlike pain for several years.  This can be worse with foods, lying on her side, and sometimes with movement.  Little improvement with PPI therapy.  She is here today to discuss ultrasound aspiration of the possibly symptomatic anterior 5.8 cm liver cyst as well as ultrasound random colon liver biopsy to assess pathology for her chronic elevated alkaline phosphatase.  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  The STAT Breast cancer panel offered by [facility] includes sequencing and rearrangement analysis for the following 9 genes:  ATM, BRCA1, BRCA2, CDH1, CHEK2, PALB2, PTEN, STK11 and TP53.   The report date is August 09, 2019.   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 fibroids   BIOPSY  05/02/2022   Procedure: BIOPSY;  Surgeon: Cindie Carlin POUR, DO;  [facility];  Service: Endoscopy;;   BREAST BIOPSY Left 05/2019   left breast DCIS   BREAST LUMPECTOMY Left 06/2019   DCIS   BREAST LUMPECTOMY WITH RADIOACTIVE SEED LOCALIZATION Left 07/08/2019   Procedure: LEFT BREAST LUMPECTOMY WITH RADIOACTIVE SEED LOCALIZATION;  Surgeon: Curvin Deward MOULD, MD;  Location: [facility];  Service: General;  Laterality: Left;   BREAST SURGERY N/A    Phreesia 08/30/2020   CESAREAN SECTION     CESAREAN SECTION  N/A    [employer]reesia 08/30/2020   CHOLECYSTECTOMY     COLONOSCOPY  2011   [place] : two 3-4 mm sessile polyps, hyperplastic, sigmoid diverticula, TI appeared normal.    COLONOSCOPY WITH PROPOFOL  N/A 05/02/2022   Procedure: COLONOSCOPY WITH PROPOFOL ;  Surgeon: Cindie Carlin POUR, DO;  [facility];  Service: Endoscopy;  Laterality: N/A;  11:30 am   ESOPHAGOGASTRODUODENOSCOPY  2011   [place] : non-bleeding erosive gastropathy, normal duodenum. path: unremarkable duodenum,  negative H.pylori, minimal esophagitis    ESOPHAGOGASTRODUODENOSCOPY N/A 11/23/2022   Procedure: ESOPHAGOGASTRODUODENOSCOPY (EGD);  Surgeon: Wilhelmenia Aloha Raddle., MD;  Location: [facility];  Service: Gastroenterology;  Laterality: N/A;   ESOPHAGOGASTRODUODENOSCOPY (EGD) WITH PROPOFOL  N/A 05/02/2022   Procedure: ESOPHAGOGASTRODUODENOSCOPY (EGD) WITH PROPOFOL ;  Surgeon: Cindie Carlin POUR, DO;  [facility];  Service: Endoscopy;  Laterality: N/A;   EUS N/A 11/23/2022   Procedure: UPPER ENDOSCOPIC ULTRASOUND (EUS) RADIAL;  Surgeon: Wilhelmenia Aloha Raddle., MD;  Location: [facility];  Service: Gastroenterology;  Laterality: N/A;   EYE SURGERY  2022   POLYPECTOMY  05/02/2022   Procedure: POLYPECTOMY;  Surgeon: Cindie Carlin POUR, DO;  [facility];  Service: Endoscopy;;   REFRACTIVE SURGERY  2024   TONSILLECTOMY  1975    Allergies: Coconut (cocos nucifera), Other, Prednisone , Iodinated contrast media, Petrolatum, and Naproxen   Medications: Prior to Admission medications   Medication Sig Start Date End Date Taking? Authorizing Provider  acetaminophen  (TYLENOL ) 500 MG tablet Take 500 mg by mouth every 8 (eight) hours as needed for moderate pain (pain score 4-6).    [provider]  albuterol  (VENTOLIN  HFA) 108 (90 Base) MCG/ACT inhaler Inhale 1 puff into the lungs every 6 (six) hours as needed for wheezing or shortness of breath. 01/03/23   Tobie Suzzane POUR, MD  Carboxymethylcell-Glycerin PF (REFRESH RELIEVA PF) 0.5-1 % SOLN Place 1 drop into both eyes in the morning, at noon, in the evening, and at bedtime.    [provider]  hydrochlorothiazide  (HYDRODIURIL ) 12.5 MG tablet TAKE 1 TABLET(12.5 MG) BY MOUTH DAILY 06/11/23   Patel, Rutwik K, MD  latanoprost (XALATAN) 0.005 % ophthalmic solution Place 1 drop into both eyes at bedtime. 06/06/22   [provider]  Misc Natural Products (SAMBUCUS ELDERBERRY VITAMIN C MT) Take 1 each by mouth daily. Gummy     [provider]  Misc. Devices MISC Compression stockings XL: 20-30 mm Hg 01/22/23   Tobie Suzzane POUR, MD  SYNTHROID  100 MCG tablet Take 1 tablet (100 mcg total) by mouth daily before breakfast. 05/04/23   Nida, Gebreselassie W, MD  Thiamine  HCl (VITAMIN B-1 PO) Take 1 tablet by mouth in the morning.    [provider]     Family History  Problem Relation Age of Onset   Breast cancer Maternal Grandmother        dx over 47   Prostate cancer Maternal Grandfather    Arthritis Mother    COPD Mother    Breast cancer Mother 63       second at age 21   Hearing loss Mother    Cancer Father        throat   Stroke Father    Cancer Maternal Aunt 46       [name], cervical cancer   Breast cancer Maternal Aunt        dx over 16   Breast cancer Paternal Aunt        dx under 50   Prostate cancer  Maternal Uncle        dx over 47   Cancer Other        father's maternal cousin was Henreietta Lacks, Cervical   Cancer Maternal Uncle        unknown cancer   Breast cancer Paternal Aunt        dx over 74   Colon cancer Neg Hx    Allergic rhinitis Neg Hx    Angioedema Neg Hx    Asthma Neg Hx    Atopy Neg Hx    Eczema Neg Hx    Urticaria Neg Hx    Immunodeficiency Neg Hx     Social History   Socioeconomic History   Marital status: Divorced    Spouse name: Not on file   Number of children: 2   Years of education: 15   Highest education level: Not on file  Occupational History   Occupation: retired    Comment: Loss adjuster, chartered at a database administrator  Tobacco Use   Smoking status: Former    Current packs/day: 0.00    Average packs/day: 0.5 packs/day for 15.0 years (7.5 ttl pk-yrs)    Types: Cigarettes    Start date: 06/16/1983    Quit date: 06/15/1998    Years since quitting: 25.0   Smokeless tobacco: Never   Tobacco comments:    quit 2002  Vaping Use   Vaping status: Never Used  Substance and Sexual Activity   Alcohol use: No   Drug use: Never    Sexual activity: Not Currently    Birth control/protection: Surgical, None    Comment: hyst  Other Topics Concern   Not on file  Social History Narrative   Lives alone   2 grown children- one in [place] and one here   Grandchildren       Moved to [place] from [place] for aging mother who is 36 with Alzheimers      Maternal Aunt is Animator of the [employer] cancer call line      Enjoy: gym, time with friends       Diet: eats all food groups   Caffeine: 2 diet cokes daily    Water : 2-4 cups daily       Wears seat belt    Does not use phone while driving    Smoke and carbon detectors at home   [employer]    No weapons       Right Handed    Lives alone in a one story home    Retired   Social Drivers of Corporate Investment Banker Strain: Low Risk  (11/22/2022)   Overall Financial Resource Strain (CARDIA)    Difficulty of Paying Living Expenses: Not hard at all  Food Insecurity: No Food Insecurity (11/22/2022)   Hunger Vital Sign    Worried About Running Out of Food in the Last Year: Never true    Ran Out of Food in the Last Year: Never true  Transportation Needs: No Transportation Needs (11/22/2022)   PRAPARE - Administrator, Civil Service (Medical): No    Lack of Transportation (Non-Medical): No  Physical Activity: Sufficiently Active (11/22/2022)   Exercise Vital Sign    Days of Exercise per Week: 7 days    Minutes of Exercise per Session: 60 min  Stress: No Stress Concern Present (11/22/2022)   [employer] of Occupational Health - Occupational Stress Questionnaire    Feeling of Stress : Not at  all  Social Connections: Moderately Isolated (11/22/2022)   Social Connection and Isolation Panel [NHANES]    Frequency of Communication with Friends and Family: More than three times a week    Frequency of Social Gatherings with Friends and Family: More than three times a week    Attends Religious Services: Never    Database Administrator or  Organizations: Yes    Attends Banker Meetings: 1 to 4 times per year    Marital Status: Divorced       Review of Systems: A 12 point ROS discussed and pertinent positives are indicated in the HPI above.  All other systems are negative.  Review of Systems  Vital Signs: BP 123/66   Pulse 85   Temp 97.6 F (36.4 C) (Oral)   Resp 19   SpO2 98%       Physical Exam Constitutional:      General: She is not in acute distress.    Appearance: She is not toxic-appearing or diaphoretic.  Eyes:     General: Scleral icterus present.     Conjunctiva/sclera: Conjunctivae normal.  Cardiovascular:     Rate and Rhythm: Normal rate and regular rhythm.     Heart sounds: No murmur heard. Pulmonary:     Effort: Pulmonary effort is normal. No respiratory distress.     Breath sounds: Normal breath sounds.  Abdominal:     General: There is no distension.     Palpations: Abdomen is soft.     Tenderness: There is no abdominal tenderness. There is no guarding.  Skin:    General: Skin is warm and dry.     Coloration: Skin is not jaundiced.  Neurological:     General: No focal deficit present.     Mental Status: Mental status is at baseline.  Psychiatric:        Mood and Affect: Mood normal.        Thought Content: Thought content normal.           Imaging: MM 3D SCREENING MAMMOGRAM BILATERAL BREAST Result Date: 06/19/2023 CLINICAL DATA:  Screening. EXAM: DIGITAL SCREENING BILATERAL MAMMOGRAM WITH TOMOSYNTHESIS AND CAD TECHNIQUE: Bilateral screening digital craniocaudal and mediolateral oblique mammograms were obtained. Bilateral screening digital breast tomosynthesis was performed. The images were evaluated with computer-aided detection. COMPARISON:  Previous exam(s). ACR Breast Density Category b: There are scattered areas of fibroglandular density. FINDINGS: There are no findings suspicious for malignancy. IMPRESSION: No mammographic evidence of malignancy. A result letter  of this screening mammogram will be mailed directly to the patient. RECOMMENDATION: Screening mammogram in one year. (Code:SM-B-01Y) BI-RADS CATEGORY  1: Negative. Electronically Signed   By: Rosaline Collet M.D.   On: 06/19/2023 09:26   CT CARDIAC SCORING (SELF PAY ONLY) Addendum Date: 06/02/2023 ADDENDUM REPORT: 06/02/2023 09:37 ADDENDUM: This addendum is given for the purpose of evaluating for extracardiac findings. Imaged lung parenchyma and airways are normal in appearance. No pleural effusion is identified. Visualized upper abdomen demonstrates scattered hepatic cysts as seen on CT chest, abdomen and pelvis 12/07/2022. Imaged upper abdomen is otherwise unremarkable. No acute or focal bony abnormality is identified. IMPRESSION: Hepatic cysts as seen on prior CT. No other extracardiac abnormality is identified. Electronically Signed   By: Debby Prader M.D.   On: 06/02/2023 09:37   Result Date: 06/02/2023 CLINICAL DATA:  Cardiovascular Disease Risk stratification EXAM: Coronary Calcium Score TECHNIQUE: A gated, non-contrast computed tomography scan of the heart was performed using 3mm slice thickness. Axial  images were analyzed on a dedicated workstation. Calcium scoring of the coronary arteries was performed using the Agatston method. FINDINGS: Coronary arteries: Normal origins. Coronary Calcium Score: Left main: 0 Left anterior descending artery: 0 Left circumflex artery: 0 Right coronary artery: 0 Total: 0 Percentile: 0 Pericardium: Normal. Ascending Aorta: Normal caliber. Non-cardiac: See separate report from [facility]. IMPRESSION: Coronary calcium score of 0. This was 0 percentile for age-, race-, and sex-matched controls. RECOMMENDATIONS: Coronary artery calcium (CAC) score is a strong predictor of incident coronary heart disease (CHD) and provides predictive information beyond traditional risk factors. CAC scoring is reasonable to use in the decision to withhold, postpone, or initiate  statin therapy in intermediate-risk or selected borderline-risk asymptomatic adults (age 79-75 years and LDL-C >=70 to <190 mg/dL) who do not have diabetes or established atherosclerotic cardiovascular disease (ASCVD).* In intermediate-risk (10-year ASCVD risk >=7.5% to <20%) adults or selected borderline-risk (10-year ASCVD risk >=5% to <7.5%) adults in whom a CAC score is measured for the purpose of making a treatment decision the following recommendations have been made: If CAC=0, it is reasonable to withhold statin therapy and reassess in 5 to 10 years, as long as higher risk conditions are absent (diabetes mellitus, family history of premature CHD in first degree relatives (males <55 years; females <65 years), cigarette smoking, or LDL >=190 mg/dL). If CAC is 1 to 99, it is reasonable to initiate statin therapy for patients >=50 years of age. If CAC is >=100 or >=75th percentile, it is reasonable to initiate statin therapy at any age. Cardiology referral should be considered for patients with CAC scores >=400 or >=75th percentile. *2018 AHA/ACC/AACVPR/AAPA/ABC/ACPM/ADA/AGS/APhA/ASPC/NLA/PCNA Guideline on the Management of Blood Cholesterol: A Report of the American College of Cardiology/American Heart Association Task Force on Clinical Practice Guidelines. J Am Coll Cardiol. 2019;73(24):3168-3209. Wilbert Bihari, MD Electronically Signed: By: Wilbert Bihari M.D. On: 05/23/2023 14:53    Labs:  CBC: Recent Labs    08/17/22 1023  WBC 6.6  HGB 14.2  HCT 44.9  PLT 216    COAGS: No results for input(s): INR, APTT in the last 8760 hours.  BMP: Recent Labs    08/17/22 1023 10/13/22 0845 01/16/23 1128 05/03/23 0926  NA 142 138 139 140  K 4.4 4.4 4.2 4.5  CL 106 106 102 104  CO2 20 20 23 20   GLUCOSE 124* 122* 114* 116*  BUN 17 18 16 16   CALCIUM 10.0 9.4 10.0 9.3  CREATININE 0.91 0.84 0.82 0.74    LIVER FUNCTION TESTS: Recent Labs    10/13/22 0845 11/28/22 0937 01/16/23 1128  05/03/23 0926  BILITOT 0.4 0.4 0.4 0.4  AST 18 19 23 19   ALT 18 17 23 17   ALKPHOS 140* 149* 159* 155*  PROT 6.5 6.7 6.8 6.8  ALBUMIN 3.7* 3.8 3.9 3.8       Assessment and Plan:  Chronic intermittent unexplained right upper quadrant abdominal pain for several years possibly related to a slightly exophytic 5.8 cm anterior left liver cyst located in the right upper quadrant.  Patient is status post previous remote cholecystectomy.  Also, she has chronically elevated alkaline phosphatase.  The ultrasound liver cyst aspiration and the ultrasound right liver and core biopsy were both reviewed with the patient.  The procedures, risk, benefits and alternatives were reviewed.  These would require that she travel to [facility] and have the procedures performed at either [facility] or [facility].  All questions were addressed.  After discussion she would like  to proceed with scheduling the procedure electively in the next few weeks.  Plan: Schedule for ultrasound anterior left liver cyst aspiration and ultrasound right liver radical biopsy as an outpatient in [place] as above.     Electronically Signed: Ozell Specking 06/20/2023, 1:17 PM   I spent a total of  40 Minutes   in face to face in clinical consultation, greater than 50% of which was counseling/coordinating care for This patient with a possibly symptomatic anterior liver cyst and elevated alkaline phosphatase

## 2023-06-22 ENCOUNTER — Encounter: Payer: Self-pay | Admitting: Orthopedic Surgery

## 2023-06-22 ENCOUNTER — Ambulatory Visit (INDEPENDENT_AMBULATORY_CARE_PROVIDER_SITE_OTHER): Payer: Medicare Other | Admitting: Orthopedic Surgery

## 2023-06-22 DIAGNOSIS — M25361 Other instability, right knee: Secondary | ICD-10-CM

## 2023-06-22 DIAGNOSIS — M25561 Pain in right knee: Secondary | ICD-10-CM | POA: Diagnosis not present

## 2023-06-22 DIAGNOSIS — G8929 Other chronic pain: Secondary | ICD-10-CM | POA: Diagnosis not present

## 2023-06-22 NOTE — Progress Notes (Signed)
 New Patient Visit  Assessment: Kara [name] is a 78 y.o. female with the following: 1. Chronic pain of right knee; mild to moderate arthritis  Plan: Kara [name] continues to have pain in her right knee.  Prior injection seems to have improved her symptoms.  However, she continues to have buckling and popping sensations in her right knee.  Radiographs demonstrates some mild to moderate arthritis.  I am concerned about a possible meniscus injury or potentially an occult fracture of the tibial plateau.  I have recommended use of a walker.  She was given a prescription for her to obtain this.  She was fitted for a new brace.  We will obtain an MRI.  I will see her back in clinic once the MRI is complete.  She states she will continue to take ibuprofen  as needed.  However, if she needs something else she will contact the clinic.  We discussed a prescription for tramadol.   Follow-up: Return for After MRI.  Subjective:  Chief Complaint  Patient presents with   Knee Pain    R knee pain worse for the past 2-3 days.     History of Present Illness: Kara [name] is a 78 y.o. female who returns for evaluation of right knee pain.  I saw her couple weeks ago.  At which time, we injected her right knee.  She notes that she had some improvement in her symptoms.  However, within the last couple of days, she has noticed buckling sensations.  These have caused her to almost fall.  She is using a walking stick.  She continues to have a lot of discomfort in the medial aspect of the right knee particular.  She states the pain radiates distally.  She notes some worsening swelling in her lower leg.   Review of Systems: No fevers or chills No numbness or tingling No chest pain No shortness of breath No bowel or bladder dysfunction No GI distress No headaches    Objective: There were no vitals taken for this visit.  Physical Exam:  General: Alert and oriented. and No acute  distress. Gait: Right sided antalgic gait.  Bilateral lower extremities with diffuse swelling.  No redness.  Mild effusion of the right knee.  Pain with flexion beyond 90 degrees.  Severe tenderness to palpation along the medial joint line.  Mild tenderness to palpation within the posterior knee.  Negative Lachman.  No increased laxity varus valgus stress.  IMAGING: No new imaging obtained today    New Medications:  No orders of the defined types were placed in this encounter.     Oneil DELENA Horde, MD  06/22/2023 4:32 PM

## 2023-06-28 ENCOUNTER — Ambulatory Visit: Payer: Federal, State, Local not specified - PPO | Admitting: "Endocrinology

## 2023-07-02 ENCOUNTER — Encounter: Payer: Self-pay | Admitting: Internal Medicine

## 2023-07-02 ENCOUNTER — Ambulatory Visit (INDEPENDENT_AMBULATORY_CARE_PROVIDER_SITE_OTHER): Payer: Medicare Other | Admitting: Internal Medicine

## 2023-07-02 VITALS — BP 100/68 | HR 79 | Ht 66.0 in | Wt 219.4 lb

## 2023-07-02 DIAGNOSIS — R7303 Prediabetes: Secondary | ICD-10-CM

## 2023-07-02 DIAGNOSIS — G4733 Obstructive sleep apnea (adult) (pediatric): Secondary | ICD-10-CM

## 2023-07-02 DIAGNOSIS — E890 Postprocedural hypothyroidism: Secondary | ICD-10-CM

## 2023-07-02 DIAGNOSIS — D0512 Intraductal carcinoma in situ of left breast: Secondary | ICD-10-CM

## 2023-07-02 DIAGNOSIS — M7989 Other specified soft tissue disorders: Secondary | ICD-10-CM

## 2023-07-02 DIAGNOSIS — K219 Gastro-esophageal reflux disease without esophagitis: Secondary | ICD-10-CM

## 2023-07-02 DIAGNOSIS — E782 Mixed hyperlipidemia: Secondary | ICD-10-CM

## 2023-07-02 DIAGNOSIS — K7689 Other specified diseases of liver: Secondary | ICD-10-CM | POA: Diagnosis not present

## 2023-07-02 DIAGNOSIS — E559 Vitamin D deficiency, unspecified: Secondary | ICD-10-CM

## 2023-07-02 NOTE — Assessment & Plan Note (Addendum)
STOP-BANG: 5 Home sleep study showed moderate OSA Has CPAP device through [facility]' pharmacy Has tried nasal mask, but still has difficulty breathing - referred to Sleep specialist She can be a good candidate for Zepbound, but insurance coverage and cost can be concerns

## 2023-07-02 NOTE — Assessment & Plan Note (Signed)
Last vitamin D Lab Results  Component Value Date   VD25OH 40.8 06/13/2023   Takes Vitamin D supplements

## 2023-07-02 NOTE — Assessment & Plan Note (Signed)
Well controlled with diet modification now - was on Pantoprazole

## 2023-07-02 NOTE — Assessment & Plan Note (Signed)
Lab Results  Component Value Date   HGBA1C 6.2 01/24/2023   Advised to follow low carb diet for now

## 2023-07-02 NOTE — Assessment & Plan Note (Signed)
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 09/01/2019: Completed adjuvant radiation   Current treatment: Surveillance Breast cancer surveillance: Mammogram 06/15/2023: Benign breast density category B

## 2023-07-02 NOTE — Assessment & Plan Note (Signed)
Likely related to venous insufficiency Echo in 2021 showed LVEF of 60-65%. Leg elevation and compression socks Referred to PT, but had groin pain Uses compression device at home, it helps - uses it twice a week, needs to use it at least 3 times in a week Used to take hydrochlorothiazide 12.5 mg every other day, advised to continue it for now until Vascular surgery evaluation - can try half tablet once daily to avoid hypotension

## 2023-07-02 NOTE — Addendum Note (Signed)
Addended byTrena Platt on: 07/02/2023 09:53 AM   Modules accepted: Level of Service

## 2023-07-02 NOTE — Patient Instructions (Signed)
Please continue to take medications as prescribed.  Please continue to follow low carb diet and perform moderate exercise/walking at least 150 mins/week.  Continue to use compression socks and perform leg elevation as tolerated for leg swelling.

## 2023-07-02 NOTE — Assessment & Plan Note (Signed)
Continue to follow low cholesterol diet for now CT coronary score was 0 in 01/25

## 2023-07-02 NOTE — Assessment & Plan Note (Signed)
Lab Results  Component Value Date   TSH 2.510 06/13/2023   On Levothyroxine 100 mcg QD, f/u with Dr Fransico Him

## 2023-07-02 NOTE — Assessment & Plan Note (Signed)
With chronic RUQ pain and elevated LFTs Followed by GI Planned to get US guided cyst aspiration and biopsy -followed by IR, last visit note reviewed

## 2023-07-02 NOTE — Progress Notes (Addendum)
Established Patient Office Visit  Subjective:  Patient ID: Kara [name], female    DOB: Apr 09, 1946  Age: 78 y.o. MRN: [id number]  CC:  Chief Complaint  Patient presents with  . Care Management    6 month f/u    HPI Kara [name] is a 78 y.o. female with past medical history of GERD and hypothyroidism who presents for f/u of her chronic medical conditions.  She has been taking Synthroid regularly for her hypothyroidism.  Denies any recent change in weight or appetite.  She was advised to get sleep study by optometrist.  She is unsure of snoring at nighttime.  She has daytime fatigue, chronic.  Denies known episode of apnea. Sleep study showed moderate OSA. She received CPAP device, but has suffocation even with nasal mask.  She still complains of persistent leg swelling, but is better with HCTZ every other day usually.  She has been having dizziness even with HCTZ 12.5 mg every other day, takes it PRN now. She had dizziness with Lasix, likely due to orthostatic hypotension.  She has tried leg elevation and compression socks without much relief.  She uses sequential compression device twice in a week, and feels better after using it. She has been referred to Vascular surgery by Cardiology for consideration of saphenous vein ablation. She denies orthopnea or PND currently.  Past Medical History:  Diagnosis Date  . Allergy    food allergies, drug allergies  . Anemia   . Anxiety   . Arthritis   . 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 Breast pain, left 02/25/2018  . Cataract   . Change in bowel function 04/09/2018  . Chronic pain of left knee 09/03/2019  . Colon polyps 08/23/2016  . COVID-19 virus infection 01/14/2020  . Dyspepsia 09/29/2016  . Family history of breast cancer   . Family history of cancer 08/23/2016   Aunt is [employer]  . Family history of prostate cancer   . Genetic testing 08/11/2019   Negative genetic testing on the [facility] 9-gene STAT  panel.  The STAT Breast cancer panel offered by [facility] includes sequencing and rearrangement analysis for the following 9 genes:  ATM, BRCA1, BRCA2, CDH1, CHEK2, PALB2, PTEN, STK11 and TP53.   The report date is August 09, 2019.  Marland Kitchen GERD (gastroesophageal reflux disease)   . Glaucoma 2023  . H/O swallowed foreign body 11/20/2018  . Headache in back of head 12/31/2018  . Hip pain, acute, left 03/27/2019  . HLD (hyperlipidemia) 08/23/2016  . Hyperlipidemia   . Hypothyroid 08/14/2016  . Insomnia due to stress 12/23/2018  . 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 Perimenopausal vasomotor symptoms 02/11/2016  . Personal history of radiation therapy 2021   completed left breast radiation in April 2021  . Postmenopausal atrophic vaginitis 11/09/2015  . Pre-diabetes    no meds  . Prediabetes 08/30/2017  . Sleep apnea [id number]  . Thyroid disease    Grave's  . Vaginal discharge 01/16/2019  . Weight loss, unintentional 03/27/2019    Past Surgical History:  Procedure Laterality Date  . ABDOMINAL HYSTERECTOMY     bleeding. fibroids  . BIOPSY  05/02/2022   Procedure: BIOPSY;  Surgeon: Lanelle Bal, DO;  Location: [facility];  Service: Endoscopy;;  . BREAST BIOPSY Left 05/2019   left breast DCIS  . BREAST LUMPECTOMY Left 06/2019   DCIS  .  BREAST LUMPECTOMY WITH RADIOACTIVE SEED LOCALIZATION Left 07/08/2019   Procedure: LEFT BREAST LUMPECTOMY WITH RADIOACTIVE SEED LOCALIZATION;  Surgeon: Griselda Miner, MD;  Location: [facility];  Service: General;  Laterality: Left;  . BREAST SURGERY N/A    Phreesia 08/30/2020  . CESAREAN SECTION    . CESAREAN SECTION N/A    Phreesia 08/30/2020  . CHOLECYSTECTOMY    . COLONOSCOPY  2011   [place]: two 3-4 mm sessile polyps, hyperplastic, sigmoid diverticula, TI appeared normal.   . COLONOSCOPY WITH PROPOFOL N/A 05/02/2022   Procedure:  COLONOSCOPY WITH PROPOFOL;  Surgeon: Lanelle Bal, DO;  Location: [facility];  Service: Endoscopy;  Laterality: N/A;  11:30 am  . ESOPHAGOGASTRODUODENOSCOPY  2011   [place]: non-bleeding erosive gastropathy, normal duodenum. path: unremarkable duodenum, negative H.pylori, minimal esophagitis   . ESOPHAGOGASTRODUODENOSCOPY N/A 11/23/2022   Procedure: ESOPHAGOGASTRODUODENOSCOPY (EGD);  Surgeon: Lemar Lofty., MD;  Location: [facility];  Service: Gastroenterology;  Laterality: N/A;  . ESOPHAGOGASTRODUODENOSCOPY (EGD) WITH PROPOFOL N/A 05/02/2022   Procedure: ESOPHAGOGASTRODUODENOSCOPY (EGD) WITH PROPOFOL;  Surgeon: Lanelle Bal, DO;  Location: [facility];  Service: Endoscopy;  Laterality: N/A;  . EUS N/A 11/23/2022   Procedure: UPPER ENDOSCOPIC ULTRASOUND (EUS) RADIAL;  Surgeon: Meridee Score Netty Starring., MD;  Location: [facility];  Service: Gastroenterology;  Laterality: N/A;  . EYE SURGERY  2022  . IR RADIOLOGIST EVAL & MGMT  06/20/2023  . POLYPECTOMY  05/02/2022   Procedure: POLYPECTOMY;  Surgeon: Lanelle Bal, DO;  Location: [facility];  Service: Endoscopy;;  . REFRACTIVE SURGERY  2024  . TONSILLECTOMY  1975    Family History  Problem Relation Age of Onset  . Breast cancer Maternal Grandmother        dx over 50  . Prostate cancer Maternal Grandfather   . Arthritis Mother   . COPD Mother   . Breast cancer Mother 25       second at age 52  . Hearing loss Mother   . Cancer Father        throat  . Stroke Father   . Cancer Maternal Aunt [address] Lacks, cervical cancer  . Breast cancer Maternal Aunt        dx over 50  . Breast cancer Paternal Aunt        dx under 50  . Prostate cancer Maternal Uncle        dx over 50  . Cancer Other        father's maternal cousin was Henreietta Lacks, Cervical  . Cancer Maternal Uncle        unknown cancer  . Breast cancer Paternal Aunt        dx over 9  . Colon cancer Neg Hx   . Allergic rhinitis  Neg Hx   . Angioedema Neg Hx   . Asthma Neg Hx   . Atopy Neg Hx   . Eczema Neg Hx   . Urticaria Neg Hx   . Immunodeficiency Neg Hx     Social History   Socioeconomic History  . Marital status: Divorced    Spouse name: Not on file  . Number of children: 2  . Years of education: 45  . Highest education level: Some college, no degree  Occupational History  . Occupation: retired    Comment: Loss adjuster, chartered at [employer]  Tobacco Use  . Smoking status: Former    Current packs/day: 0.00  Average packs/day: 0.5 packs/day for 15.0 years (7.5 ttl pk-yrs)    Types: Cigarettes    Start date: 06/16/1983    Quit date: 06/15/1998    Years since quitting: 25.0  . Smokeless tobacco: Never  . Tobacco comments:    quit 2002  Vaping Use  . Vaping status: Never Used  Substance and Sexual Activity  . Alcohol use: No  . Drug use: Never  . Sexual activity: Not Currently    Birth control/protection: Surgical, None    Comment: hyst  Other Topics Concern  . Not on file  Social History Narrative   Lives alone   2 grown children- one in [place] and one here   Grandchildren       Moved to [place] from [place] for aging mother who is 40 with Alzheimers      Maternal Aunt is Animator of the [employer] cancer call line      Enjoy: gym, time with friends       Diet: eats all food groups   Caffeine: 2 diet cokes daily    Water: 2-4 cups daily       Wears seat belt    Does not use phone while driving    Smoke and carbon detectors at home   [employer]    No weapons       Right Handed    Lives alone in a one story home    Retired   Social Drivers of [employer]: Low Risk  (06/28/2023)   Overall Financial Resource Strain (CARDIA)   . Difficulty of Paying Living Expenses: Not hard at all  Food Insecurity: No Food Insecurity (06/28/2023)   Hunger Vital Sign   . Worried About Programme researcher, broadcasting/film/video in the Last Year: Never true   . Ran  Out of Food in the Last Year: Never true  Transportation Needs: No Transportation Needs (06/28/2023)   PRAPARE - Transportation   . Lack of Transportation (Medical): No   . Lack of Transportation (Non-Medical): No  Physical Activity: Sufficiently Active (06/28/2023)   Exercise Vital Sign   . Days of Exercise per Week: 7 days   . Minutes of Exercise per Session: 30 min  Stress: Patient Declined (06/28/2023)   [employer] of Occupational Health - Occupational Stress Questionnaire   . Feeling of Stress : Patient declined  Social Connections: Moderately Integrated (06/28/2023)   Social Connection and Isolation Panel [NHANES]   . Frequency of Communication with Friends and Family: More than three times a week   . Frequency of Social Gatherings with Friends and Family: More than three times a week   . Attends Religious Services: More than 4 times per year   . Active Member of Clubs or Organizations: Yes   . Attends Banker Meetings: More than 4 times per year   . Marital Status: Divorced  Catering manager Violence: Not At Risk (11/22/2022)   Humiliation, Afraid, Rape, and Kick questionnaire   . Fear of Current or Ex-Partner: No   . Emotionally Abused: No   . Physically Abused: No   . Sexually Abused: No    Outpatient Medications Prior to Visit  Medication Sig Dispense Refill  . acetaminophen (TYLENOL) 500 MG tablet Take 500 mg by mouth every 8 (eight) hours as needed for moderate pain (pain score 4-6).    Marland Kitchen albuterol (VENTOLIN HFA) 108 (90 Base) MCG/ACT inhaler Inhale 1 puff into the lungs every 6 (  six) hours as needed for wheezing or shortness of breath. 6.7 g 2  . Carboxymethylcell-Glycerin PF (REFRESH RELIEVA PF) 0.5-1 % SOLN Place 1 drop into both eyes in the morning, at noon, in the evening, and at bedtime.    . hydrochlorothiazide (HYDRODIURIL) 12.5 MG tablet TAKE 1 TABLET(12.5 MG) BY MOUTH DAILY 30 tablet 0  . latanoprost (XALATAN) 0.005 % ophthalmic solution Place 1  drop into both eyes at bedtime.    . Misc Natural Products (SAMBUCUS ELDERBERRY VITAMIN C MT) Take 1 each by mouth daily. Gummy    . Misc. Devices MISC Compression stockings XL: 20-30 mm Hg 1 each 0  . SYNTHROID 100 MCG tablet Take 1 tablet (100 mcg total) by mouth daily before breakfast. 90 tablet 1  . Thiamine HCl (VITAMIN B-1 PO) Take 1 tablet by mouth in the morning.     No facility-administered medications prior to visit.    Allergies  Allergen Reactions  . Coconut (Cocos Nucifera) Anaphylaxis  . Other Shortness Of Breath    Covid booster pfizer    . Prednisone Hypertension    Severe headache  . Iodinated Contrast Media Swelling    Pt reported throat swelling post-procedure that resolved with Benadryl at home  . Petrolatum Dermatitis    Caused incision irritation   . Naproxen Other (See Comments)    headache    ROS Review of Systems  Constitutional:  Positive for fatigue. Negative for chills and fever.  HENT:  Negative for congestion, sinus pressure, sinus pain and sore throat.   Eyes:  Negative for pain and discharge.  Respiratory:  Negative for cough and shortness of breath.   Cardiovascular:  Positive for leg swelling. Negative for chest pain and palpitations.  Gastrointestinal:  Negative for abdominal pain, constipation, diarrhea, nausea and vomiting.  Endocrine: Negative for polydipsia and polyuria.  Genitourinary:  Negative for dysuria and hematuria.  Musculoskeletal:  Positive for arthralgias and back pain. Negative for neck pain and neck stiffness.  Skin:  Negative for rash.  Neurological:  Positive for numbness (and tingling of LE). Negative for dizziness and weakness.  Psychiatric/Behavioral:  Negative for agitation and behavioral problems.       Objective:    Physical Exam Vitals reviewed.  Constitutional:      General: She is not in acute distress.    Appearance: She is obese. She is not diaphoretic.  HENT:     Head: Normocephalic and atraumatic.      Nose: Nose normal. No congestion.     Mouth/Throat:     Mouth: Mucous membranes are moist.     Pharynx: No posterior oropharyngeal erythema.  Eyes:     General: No scleral icterus.    Extraocular Movements: Extraocular movements intact.  Cardiovascular:     Rate and Rhythm: Normal rate and regular rhythm.     Pulses: Normal pulses.     Heart sounds: Normal heart sounds. No murmur heard. Pulmonary:     Breath sounds: Normal breath sounds. No wheezing or rales.  Musculoskeletal:     Cervical back: Neck supple. No tenderness.     Right lower leg: Edema (1+) present.     Left lower leg: Edema (1+) present.  Skin:    General: Skin is warm.     Findings: No rash.  Neurological:     General: No focal deficit present.     Mental Status: She is alert and oriented to person, place, and time.     Sensory: Sensory deficit (B/l  LE) present.     Motor: No weakness.  Psychiatric:        Mood and Affect: Mood normal.        Behavior: Behavior normal.    BP 100/68   Pulse 79   Ht 5\' 6"  (1.676 m)   Wt 219 lb 6.4 oz (99.5 kg)   SpO2 98%   BMI 35.41 kg/m  Wt Readings from Last 3 Encounters:  07/02/23 219 lb 6.4 oz (99.5 kg)  06/13/23 224 lb 12.8 oz (102 kg)  06/05/23 222 lb (100.7 kg)    Lab Results  Component Value Date   TSH 2.510 06/13/2023   Lab Results  Component Value Date   WBC 6.6 08/17/2022   HGB 14.2 08/17/2022   HCT 44.9 08/17/2022   MCV 81 08/17/2022   PLT 216 08/17/2022   Lab Results  Component Value Date   NA 140 05/03/2023   K 4.5 05/03/2023   CO2 20 05/03/2023   GLUCOSE 116 (H) 05/03/2023   BUN 16 05/03/2023   CREATININE 0.74 05/03/2023   BILITOT 0.4 05/03/2023   ALKPHOS 155 (H) 05/03/2023   AST 19 05/03/2023   ALT 17 05/03/2023   PROT 6.8 05/03/2023   ALBUMIN 3.8 05/03/2023   CALCIUM 9.3 05/03/2023   ANIONGAP 6 04/25/2022   EGFR 84 05/03/2023   Lab Results  Component Value Date   CHOL 213 (H) 05/03/2023   Lab Results  Component Value Date    HDL 58 05/03/2023   Lab Results  Component Value Date   LDLCALC 141 (H) 05/03/2023   Lab Results  Component Value Date   TRIG 80 05/03/2023   Lab Results  Component Value Date   CHOLHDL 3.7 05/03/2023   Lab Results  Component Value Date   HGBA1C 6.2 01/24/2023      Assessment & Plan:   Problem List Items Addressed This Visit       Respiratory   OSA (obstructive sleep apnea) - Primary   STOP-BANG: 5 Home sleep study showed moderate OSA Has CPAP device through [facility] pharmacy Has tried nasal mask, but still has difficulty breathing - referred to Sleep specialist She can be a good candidate for Zepbound, but insurance coverage and cost can be concerns      Relevant Orders   Ambulatory referral to Pulmonology     Digestive   GERD (gastroesophageal reflux disease)   Well controlled with diet modification now - was on Pantoprazole      Hepatic cyst   With chronic RUQ pain and elevated LFTs Followed by GI Planned to get US guided cyst aspiration and biopsy -followed by IR, last visit note reviewed        Endocrine   Hypothyroidism following radioiodine therapy   Lab Results  Component Value Date   TSH 2.510 06/13/2023   On Levothyroxine 100 mcg QD, f/u with Dr Fransico Him        Other   Mixed hyperlipidemia   Continue to follow low cholesterol diet for now CT coronary score was 0 in 01/25      Prediabetes   Lab Results  Component Value Date   HGBA1C 6.2 01/24/2023   Advised to follow low carb diet for now      Relevant Orders   Microalbumin / creatinine urine ratio   Ductal carcinoma in situ (DCIS) of left breast   07/08/2019:Left lumpectomy [name]): high grade DCIS, 1.5cm, clear margins. 09/01/2019: Completed adjuvant radiation   Current treatment: Surveillance Breast cancer surveillance:  Mammogram 06/15/2023: Benign breast density category B      Vitamin D deficiency   Last vitamin D Lab Results  Component Value Date   VD25OH 40.8 06/13/2023    Takes Vitamin D supplements      Leg swelling   Likely related to venous insufficiency Echo in 2021 showed LVEF of 60-65%. Leg elevation and compression socks Referred to PT, but had groin pain Uses compression device at home, it helps - uses it twice a week, needs to use it at least 3 times in a week Used to take hydrochlorothiazide 12.5 mg every other day, advised to continue it for now until Vascular surgery evaluation - can try half tablet once daily to avoid hypotension         No orders of the defined types were placed in this encounter.   Follow-up: Return in about 6 months (around 12/30/2023).    Anabel Halon, MD

## 2023-07-03 LAB — LIPID PANEL
Chol/HDL Ratio: 3.5 {ratio} (ref 0.0–4.4)
Cholesterol, Total: 190 mg/dL (ref 100–199)
HDL: 54 mg/dL (ref >39)
LDL Chol Calc (NIH): 123 mg/dL — ABNORMAL HIGH (ref 0–99)
Triglycerides: 68 mg/dL (ref 0–149)
VLDL Cholesterol Cal: 13 mg/dL (ref 5–40)

## 2023-07-03 LAB — TSH: TSH: 2.03 u[IU]/mL (ref 0.450–4.500)

## 2023-07-03 LAB — T4, FREE: Free T4: 1.78 ng/dL — ABNORMAL HIGH (ref 0.82–1.77)

## 2023-07-04 LAB — MICROALBUMIN / CREATININE URINE RATIO
Creatinine, Urine: 167.2 mg/dL
Microalb/Creat Ratio: 5 mg/g{creat} (ref 0–29)
Microalbumin, Urine: 8.5 ug/mL

## 2023-07-09 ENCOUNTER — Other Ambulatory Visit: Payer: Self-pay

## 2023-07-09 ENCOUNTER — Encounter: Payer: Self-pay | Admitting: Internal Medicine

## 2023-07-10 ENCOUNTER — Encounter: Payer: Self-pay | Admitting: "Endocrinology

## 2023-07-10 ENCOUNTER — Other Ambulatory Visit: Payer: Self-pay

## 2023-07-10 ENCOUNTER — Other Ambulatory Visit: Payer: Self-pay | Admitting: Internal Medicine

## 2023-07-10 ENCOUNTER — Ambulatory Visit (INDEPENDENT_AMBULATORY_CARE_PROVIDER_SITE_OTHER): Payer: Federal, State, Local not specified - PPO | Admitting: "Endocrinology

## 2023-07-10 VITALS — BP 116/78 | HR 76 | Ht 66.0 in | Wt 220.4 lb

## 2023-07-10 DIAGNOSIS — E890 Postprocedural hypothyroidism: Secondary | ICD-10-CM

## 2023-07-10 DIAGNOSIS — E782 Mixed hyperlipidemia: Secondary | ICD-10-CM

## 2023-07-10 DIAGNOSIS — E66812 Obesity, class 2: Secondary | ICD-10-CM | POA: Insufficient documentation

## 2023-07-10 DIAGNOSIS — R7303 Prediabetes: Secondary | ICD-10-CM | POA: Diagnosis not present

## 2023-07-10 DIAGNOSIS — Z6835 Body mass index (BMI) 35.0-35.9, adult: Secondary | ICD-10-CM

## 2023-07-10 LAB — POCT GLYCOSYLATED HEMOGLOBIN (HGB A1C): HbA1c, POC (controlled diabetic range): 6.2 % (ref 0.0–7.0)

## 2023-07-10 MED ORDER — LEVOTHYROXINE SODIUM 88 MCG PO TABS: 88.0000 ug | ORAL_TABLET | Freq: Every day | ORAL | 1 refills | Status: DC

## 2023-07-10 NOTE — Progress Notes (Signed)
 07/10/2023, 12:44 PM                         Endocrinology follow-up note   Kara [name] is a 78 y.o.-year-old female patient being seen in follow-up for management of  RAI induced hypothyroidism referred by Anabel Halon, MD.   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  The STAT Breast cancer panel offered by [facility] includes sequencing and rearrangement analysis for the following 9 genes:  ATM, BRCA1, BRCA2, CDH1, CHEK2, PALB2, PTEN, STK11 and TP53.   The report date is August 09, 2019.   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 fibroids   BIOPSY  05/02/2022   Procedure: BIOPSY;  Surgeon: Lanelle Bal, DO;  Location: [facility];  Service: Endoscopy;;   BREAST BIOPSY Left 05/2019   left breast DCIS   BREAST LUMPECTOMY Left 06/2019   DCIS   BREAST LUMPECTOMY WITH RADIOACTIVE SEED LOCALIZATION Left 07/08/2019   Procedure: LEFT BREAST LUMPECTOMY WITH RADIOACTIVE SEED LOCALIZATION;  Surgeon: Griselda Miner, MD;  Location: [facility];  Service: General;  Laterality: Left;   BREAST SURGERY N/A    Phreesia 08/30/2020   CESAREAN SECTION     CESAREAN SECTION N/A  [employer]reesia 08/30/2020   CHOLECYSTECTOMY     COLONOSCOPY  2011   [place]: two 3-4 mm sessile polyps, hyperplastic, sigmoid diverticula, TI appeared normal.    COLONOSCOPY WITH PROPOFOL N/A 05/02/2022   Procedure: COLONOSCOPY WITH PROPOFOL;  Surgeon: Lanelle Bal, DO;  Location: [facility];  Service: Endoscopy;  Laterality: N/A;  11:30 am   ESOPHAGOGASTRODUODENOSCOPY  2011   [place]: non-bleeding erosive gastropathy, normal duodenum. path: unremarkable duodenum, negative H.pylori, minimal esophagitis    ESOPHAGOGASTRODUODENOSCOPY N/A 11/23/2022   Procedure: ESOPHAGOGASTRODUODENOSCOPY (EGD);  Surgeon: Lemar Lofty., MD;  Location: [facility];  Service: Gastroenterology;  Laterality: N/A;   ESOPHAGOGASTRODUODENOSCOPY (EGD) WITH PROPOFOL N/A 05/02/2022   Procedure: ESOPHAGOGASTRODUODENOSCOPY (EGD) WITH PROPOFOL;  Surgeon: Lanelle Bal, DO;  [facility]: [facility];  Service: Endoscopy;  Laterality: N/A;   EUS N/A 11/23/2022   Procedure: UPPER ENDOSCOPIC ULTRASOUND (EUS) RADIAL;  Surgeon: Lemar Lofty., MD;  Location: [facility];  Service: Gastroenterology;  Laterality: N/A;   EYE SURGERY  2022   IR RADIOLOGIST EVAL & MGMT  06/20/2023   POLYPECTOMY  05/02/2022    Procedure: POLYPECTOMY;  Surgeon: Lanelle Bal, DO;  Location: [facility];  Service: Endoscopy;;   REFRACTIVE SURGERY  2024   TONSILLECTOMY  1975    Social History   Socioeconomic History   Marital status: Divorced    Spouse name: Not on file   Number of children: 2   Years of education: 15   Highest education level: Some college, no degree  Occupational History   Occupation: retired    Comment: Loss adjuster, chartered at a Database administrator  Tobacco Use   Smoking status: Former    Current packs/day: 0.00    Average packs/day: 0.5 packs/day for 15.0 years (7.5 ttl pk-yrs)    Types: Cigarettes    Start date: 06/16/1983    Quit date: 06/15/1998    Years since quitting: 25.0   Smokeless tobacco: Never   Tobacco comments:    quit 2002  Vaping Use   Vaping status: Never Used  Substance and Sexual Activity   Alcohol use: No   Drug use: Never   Sexual activity: Not Currently    Birth control/protection: Surgical, None    Comment: hyst  Other Topics Concern   Not on file  Social History Narrative   Lives alone   2 grown children- one in [place] and one here   Grandchildren       Moved to [place] from [place] for aging mother who is 71 with Alzheimers      Maternal Aunt is Animator of the [employer] cancer call line      Enjoy: gym, time with friends       Diet: eats all food groups   Caffeine: 2 diet cokes daily    Water: 2-4 cups daily       Wears seat belt    Does not use phone while driving    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   [employer]    No weapons       Right Handed    Lives alone in a one story home    Retired   Social Drivers of Corporate investment banker Strain: Low Risk  (06/28/2023)   Overall Financial Resource Strain (CARDIA)    Difficulty of Paying Living Expenses: Not hard at all  Food Insecurity: No Food Insecurity (06/28/2023)   Hunger Vital Sign    Worried About Running  Out of Food in the Last Year: Never true     Ran Out of Food in the Last Year: Never true  Transportation Needs: No Transportation Needs (06/28/2023)   PRAPARE - Administrator, Civil Service (Medical): No    Lack of Transportation (Non-Medical): No  Physical Activity: Sufficiently Active (06/28/2023)   Exercise Vital Sign    Days of Exercise per Week: 7 days    Minutes of Exercise per Session: 30 min  Stress: Patient Declined (06/28/2023)   [employer] of Occupational Health - Occupational Stress Questionnaire    Feeling of Stress : Patient declined  Social Connections: Moderately Integrated (06/28/2023)   Social Connection and Isolation Panel [NHANES]    Frequency of Communication with Friends and Family: More than three times a week    Frequency of Social Gatherings with Friends and Family: More than three times a week    Attends Religious Services: More than 4 times per year    Active Member of [employer] or Organizations: Yes    Attends Engineer, structural: More than 4 times per year    Marital Status: Divorced    Family History  Problem Relation Age of Onset   Breast cancer Maternal Grandmother        dx over 57   Prostate cancer Maternal Grandfather    Arthritis Mother    COPD Mother    Breast cancer Mother 79       second at age 52   Hearing loss Mother    Cancer Father        throat   Stroke Father    Cancer Maternal Aunt 65       [name], cervical cancer   Breast cancer Maternal Aunt        dx over 1   Breast cancer Paternal Aunt        dx under 50   Prostate cancer Maternal Uncle        dx over 22   Cancer Other        father's maternal cousin was Henreietta Lacks, Cervical   Cancer Maternal Uncle        unknown cancer   Breast cancer Paternal Aunt        dx over 75   Colon cancer Neg Hx    Allergic rhinitis Neg Hx    Angioedema Neg Hx    Asthma Neg Hx    Atopy Neg Hx    Eczema Neg Hx    Urticaria Neg Hx    Immunodeficiency Neg Hx     Outpatient Encounter  Medications as of 07/10/2023  Medication Sig   levothyroxine (SYNTHROID) 88 MCG tablet Take 88 mcg by mouth daily before breakfast.   acetaminophen (TYLENOL) 500 MG tablet Take 500 mg by mouth every 8 (eight) hours as needed for moderate pain (pain score 4-6).   albuterol (VENTOLIN HFA) 108 (90 Base) MCG/ACT inhaler Inhale 1 puff into the lungs every 6 (six) hours as needed for wheezing or shortness of breath.   Carboxymethylcell-Glycerin PF (REFRESH RELIEVA PF) 0.5-1 % SOLN Place 1 drop into both eyes in the morning, at noon, in the evening, and at bedtime.   hydrochlorothiazide (HYDRODIURIL) 12.5 MG tablet TAKE 1 TABLET(12.5 MG) BY MOUTH DAILY   latanoprost (XALATAN) 0.005 % ophthalmic solution Place 1 drop into both eyes at bedtime.   Misc Natural Products (SAMBUCUS ELDERBERRY VITAMIN C MT) Take 1 each by mouth daily. Gummy   Misc. Devices MISC  Compression stockings XL: 20-30 mm Hg   Thiamine HCl (VITAMIN B-1 PO) Take 1 tablet by mouth in the morning.   [DISCONTINUED] SYNTHROID 100 MCG tablet Take 1 tablet (100 mcg total) by mouth daily before breakfast.   No facility-administered encounter medications on file as of 07/10/2023.    ALLERGIES: Allergies  Allergen Reactions   Coconut (Cocos Nucifera) Anaphylaxis   Other Shortness Of Breath    Covid booster pfizer     Prednisone Hypertension    Severe headache   Iodinated Contrast Media Swelling    Pt reported throat swelling post-procedure that resolved with Benadryl at home   Petrolatum Dermatitis    Caused incision irritation    Naproxen Other (See Comments)    headache   VACCINATION STATUS: Immunization History  Administered Date(s) Administered   Fluad Quad(high Dose 65+) 02/11/2020, 01/20/2021, 01/25/2022   Fluad Trivalent(High Dose 65+) 01/22/2023   Influenza, High Dose Seasonal PF 02/08/2018, 02/17/2019   Influenza,inj,Quad PF,6+ Mos 02/28/2017   Influenza-Unspecified 02/28/2017   Moderna Covid-19 Vaccine Bivalent Booster  46yrs & up 02/03/2022   PFIZER(Purple Top)SARS-COV-2 Vaccination 06/06/2019, 06/24/2019, 04/21/2020, 10/07/2020, 02/22/2021   Pneumococcal Conjugate-13 07/05/2016   Pneumococcal Polysaccharide-23 04/29/2019   Rsv, Bivalent, Protein Subunit Rsvpref,pf Verdis Frederickson) 03/14/2023   Td 12/23/2018   Unspecified SARS-COV-2 Vaccination 04/30/2023   Zoster Recombinant(Shingrix) 06/06/2021    HPI    Kara [name]  is a patient with the above medical history. she was diagnosed  with hyperthyroidism at approximate age of 33 years  which required I-131 thyroid ablation in environment.   -She was subsequently initiated on thyroid hormone supplement.  She is currently on Synthroid 100 mcg p.o. daily before breakfast.     She reports compliance and consistency with her medication.  Her previous thyroid function test reveals slight over replacement.      She has prediabetes not on medication.  Her point-of-care A1c remains at 6.2%.  She has dyslipidemia, not on statins.  After she made some changes in her lifestyle, her LDL is dropping to 123.    -She still complains of fatigue, generally not having enough energy.  -She presents with steady weight since last visit.  She denies tremors, palpitations. Pt denies feeling nodules in neck, hoarseness, dysphagia/odynophagia, SOB with lying down.  she denies family history of  thyroid disorders.  No family history of thyroid cancer.  Her father had head and neck tumor which did not appear to be related to thyroid cancer.  ROS:  Limited as above.   Physical Exam: BP 116/78   Pulse 76   Ht 5\' 6"  (1.676 m)   Wt 220 lb 6.4 oz (100 kg)   BMI 35.57 kg/m  Wt Readings from Last 3 Encounters:  07/10/23 220 lb 6.4 oz (100 kg)  07/02/23 219 lb 6.4 oz (99.5 kg)  06/13/23 224 lb 12.8 oz (102 kg)    CMP     Component Value Date/Time   NA 140 05/03/2023 0926   K 4.5 05/03/2023 0926   CL 104 05/03/2023 0926   CO2 20 05/03/2023 0926   GLUCOSE 116 (H)  05/03/2023 0926   GLUCOSE 112 (H) 04/25/2022 1500   BUN 16 05/03/2023 0926   CREATININE 0.74 05/03/2023 0926   CREATININE 0.75 03/08/2020 0935   CALCIUM 9.3 05/03/2023 0926   PROT 6.8 05/03/2023 0926   ALBUMIN 3.8 05/03/2023 0926   AST 19 05/03/2023 0926   ALT 17 05/03/2023 0926   ALKPHOS 155 (H) 05/03/2023 6045  BILITOT 0.4 05/03/2023 0926   GFRNONAA 52 (L) 04/25/2022 1500   GFRNONAA 70 10/22/2019 0958   GFRAA 82 10/22/2019 0958    Diabetic Labs (most recent): Lab Results  Component Value Date   HGBA1C 6.2 07/10/2023   HGBA1C 6.2 01/24/2023   HGBA1C 6.1 10/19/2022   MICROALBUR 0.5 08/16/2017   MICROALBUR 0.3 02/19/2017    Lipid Panel     Component Value Date/Time   CHOL 190 07/02/2023 0938   TRIG 68 07/02/2023 0938   HDL 54 07/02/2023 0938   CHOLHDL 3.5 07/02/2023 0938   CHOLHDL 3.0 10/22/2019 0958   VLDL 15 11/24/2016 1018   LDLCALC 123 (H) 07/02/2023 0938   LDLCALC 114 (H) 10/22/2019 0958   LABVLDL 13 07/02/2023 0938     Lab Results  Component Value Date   TSH 2.030 07/02/2023   TSH 2.510 06/13/2023   TSH 7.520 (H) 05/03/2023   TSH 0.454 01/16/2023   TSH 0.288 (L) 10/13/2022   TSH 4.420 04/13/2022   TSH 2.720 01/09/2022   TSH 0.980 07/07/2021   TSH 0.62 03/02/2021   TSH 3.340 10/25/2020   FREET4 1.78 (H) 07/02/2023   FREET4 1.74 06/13/2023   FREET4 1.23 05/03/2023   FREET4 1.80 (H) 01/16/2023   FREET4 1.91 (H) 10/13/2022   FREET4 1.32 04/13/2022   FREET4 1.35 01/09/2022   FREET4 1.52 07/07/2021   FREET4 1.5 03/02/2021   FREET4 1.56 10/25/2020       ASSESSMENT: 1. Hypothyroidism- RAI induced 2.  Prediabetes 3. Hyperlipidemia 4. Vitamin d deficiency   PLAN:   Her previsit thyroid function tests are consistent with slight over replacement.  She is advised to lower her Synthroid to 88 mcg p.o. daily before breakfast.     - We discussed about the correct intake of her thyroid hormone, on empty stomach at fasting, with water, separated by at  least 30 minutes from breakfast and other medications,  and separated by more than 4 hours from calcium, iron, multivitamins, acid reflux medications (PPIs). -Patient is made aware of the fact that thyroid hormone replacement is needed for life, dose to be adjusted by periodic monitoring of thyroid function tests.     -Due to absence of clinical goiter, no need for thyroid ultrasound. In light of her metabolic syndrome indicated by prediabetes, hyperlipidemia, BMI of 35.57, she still is a good candidate for lifestyle medicine.      - she acknowledges that there is a room for improvement in her food and drink choices.  She is encouraged to stay on left eye medicine nutrition. - Suggestion is made for her to avoid simple carbohydrates  from her diet including Cakes, Sweet Desserts, Ice Cream, Soda (diet and regular), Sweet Tea, Candies, Chips, Cookies, Store Bought Juices, Alcohol , Artificial Sweeteners,  Coffee Creamer, and "Sugar-free" Products, Lemonade. This will help patient to have more stable blood glucose profile and potentially avoid unintended weight gain.  The following Lifestyle Medicine recommendations according to American College of Lifestyle Medicine  [facility]) were discussed and and offered to patient and she  agrees to start the journey:  A. Whole Foods, Plant-Based Nutrition comprising of fruits and vegetables, plant-based proteins, whole-grain carbohydrates was discussed in detail with the patient.   A list for source of those nutrients were also provided to the patient.  Patient will use only water or unsweetened tea for hydration. B.  The need to stay away from risky substances including alcohol, smoking; obtaining 7 to 9 hours of restorative sleep,  at least 150 minutes of moderate intensity exercise weekly, the importance of healthy social connections,  and stress management techniques were discussed. C.  A full color page of  Calorie density of various food groups per pound  showing examples of each food groups was provided to the patient.    She wishes to avoid medications for prediabetes, dyslipidemia. This approach will help her with weight management, prediabetes, HPL, HTN. In light of her ongoing fatigue, and her high risk for cardiovascular disease, This patient would benefit from evaluation by cardiology.  She may need stress test to rule out subclinical cardiovascular disease.  Referral to cardiology will be facilitated by her PCP.  She is advised to maintain close follow-up with her PCP.     I spent  25  minutes in the care of the patient today including review of labs from Thyroid Function, CMP, and other relevant labs ; imaging/biopsy records (current and previous including abstractions from other facilities); face-to-face time discussing  her lab results and symptoms, medications doses, her options of short and long term treatment based on the latest standards of care / guidelines;   and documenting the encounter.  Joaquin Music  participated in the discussions, expressed understanding, and voiced agreement with the above plans.  All questions were answered to her satisfaction. she is encouraged to contact clinic should she have any questions or concerns prior to her return visit.    Return in about 3 months (around 10/07/2023) for Fasting Labs  in AM B4 8, A1c -[place].  Marquis Lunch, MD [facility] [address] [place], [place] [address] Phone: [phone]  Fax: [phone]   07/10/2023, 12:44 PM  This note was partially dictated with voice recognition software. Similar sounding words can be transcribed inadequately or may not  be corrected upon review.

## 2023-07-11 ENCOUNTER — Encounter: Payer: Self-pay | Admitting: Orthopedic Surgery

## 2023-07-11 ENCOUNTER — Other Ambulatory Visit: Payer: Self-pay

## 2023-07-11 DIAGNOSIS — M25361 Other instability, right knee: Secondary | ICD-10-CM

## 2023-07-11 DIAGNOSIS — E890 Postprocedural hypothyroidism: Secondary | ICD-10-CM

## 2023-07-11 DIAGNOSIS — G8929 Other chronic pain: Secondary | ICD-10-CM

## 2023-07-11 MED ORDER — SYNTHROID 88 MCG PO TABS: 88.0000 ug | ORAL_TABLET | Freq: Every day | ORAL | 1 refills | Status: DC

## 2023-07-13 MED ORDER — DIAZEPAM 5 MG PO TABS: 5.0000 mg | ORAL_TABLET | Freq: Once | ORAL | 0 refills | Status: AC

## 2023-07-13 NOTE — Addendum Note (Signed)
 Addended by: Thane Edu A on: 07/13/2023 08:07 AM   Modules accepted: Orders

## 2023-07-14 ENCOUNTER — Ambulatory Visit (HOSPITAL_COMMUNITY)
Admission: RE | Admit: 2023-07-14 | Discharge: 2023-07-14 | Disposition: A | Discharge status: Home / Self Care | Payer: Medicare Other | Source: Ambulatory Visit | Attending: Orthopedic Surgery

## 2023-07-14 DIAGNOSIS — M7121 Synovial cyst of popliteal space [Baker], right knee: Secondary | ICD-10-CM | POA: Diagnosis not present

## 2023-07-14 DIAGNOSIS — M25461 Effusion, right knee: Secondary | ICD-10-CM | POA: Diagnosis not present

## 2023-07-14 DIAGNOSIS — M25361 Other instability, right knee: Secondary | ICD-10-CM | POA: Diagnosis not present

## 2023-07-14 DIAGNOSIS — M1711 Unilateral primary osteoarthritis, right knee: Secondary | ICD-10-CM | POA: Diagnosis not present

## 2023-07-14 DIAGNOSIS — S8991XA Unspecified injury of right lower leg, initial encounter: Secondary | ICD-10-CM | POA: Diagnosis not present

## 2023-07-14 DIAGNOSIS — G8929 Other chronic pain: Secondary | ICD-10-CM | POA: Diagnosis not present

## 2023-07-14 DIAGNOSIS — M25561 Pain in right knee: Secondary | ICD-10-CM | POA: Insufficient documentation

## 2023-07-17 ENCOUNTER — Other Ambulatory Visit: Payer: Self-pay | Admitting: Radiology

## 2023-07-17 DIAGNOSIS — K769 Liver disease, unspecified: Secondary | ICD-10-CM

## 2023-07-18 ENCOUNTER — Ambulatory Visit (HOSPITAL_COMMUNITY)
Admission: RE | Admit: 2023-07-18 | Discharge: 2023-07-18 | Disposition: A | Discharge status: Home / Self Care | Payer: Medicare Other | Source: Ambulatory Visit | Attending: Interventional Radiology | Admitting: Interventional Radiology

## 2023-07-18 ENCOUNTER — Encounter (HOSPITAL_COMMUNITY): Payer: Self-pay

## 2023-07-18 ENCOUNTER — Ambulatory Visit: Payer: Medicare Other | Admitting: Vascular Surgery

## 2023-07-18 ENCOUNTER — Other Ambulatory Visit: Payer: Self-pay

## 2023-07-18 DIAGNOSIS — K769 Liver disease, unspecified: Secondary | ICD-10-CM

## 2023-07-18 DIAGNOSIS — R1011 Right upper quadrant pain: Secondary | ICD-10-CM | POA: Insufficient documentation

## 2023-07-18 DIAGNOSIS — K7689 Other specified diseases of liver: Secondary | ICD-10-CM | POA: Diagnosis not present

## 2023-07-18 DIAGNOSIS — Z9049 Acquired absence of other specified parts of digestive tract: Secondary | ICD-10-CM | POA: Insufficient documentation

## 2023-07-18 DIAGNOSIS — R7989 Other specified abnormal findings of blood chemistry: Secondary | ICD-10-CM | POA: Insufficient documentation

## 2023-07-18 DIAGNOSIS — K759 Inflammatory liver disease, unspecified: Secondary | ICD-10-CM | POA: Diagnosis not present

## 2023-07-18 LAB — CBC
HCT: 45.7 % (ref 36.0–46.0)
Hemoglobin: 14.8 g/dL (ref 12.0–15.0)
MCH: 26.5 pg (ref 26.0–34.0)
MCHC: 32.4 g/dL (ref 30.0–36.0)
MCV: 81.8 fL (ref 80.0–100.0)
Platelets: 212 10*3/uL (ref 150–400)
RBC: 5.59 MIL/uL — ABNORMAL HIGH (ref 3.87–5.11)
RDW: 17.6 % — ABNORMAL HIGH (ref 11.5–15.5)
WBC: 5.8 10*3/uL (ref 4.0–10.5)
nRBC: 0 % (ref 0.0–0.2)

## 2023-07-18 LAB — PROTIME-INR
INR: 1 (ref 0.8–1.2)
Prothrombin Time: 12.9 s (ref 11.4–15.2)

## 2023-07-18 MED ORDER — DIPHENHYDRAMINE HCL 50 MG/ML IJ SOLN
INTRAMUSCULAR | Status: AC | PRN
  Administered 2023-07-18: 25 mg via INTRAVENOUS

## 2023-07-18 MED ORDER — FENTANYL CITRATE (PF) 100 MCG/2ML IJ SOLN
INTRAMUSCULAR | Status: AC
  Filled 2023-07-18: qty 4

## 2023-07-18 MED ORDER — GELATIN ABSORBABLE 12-7 MM EX MISC
CUTANEOUS | Status: AC
  Filled 2023-07-18: qty 1

## 2023-07-18 MED ORDER — LIDOCAINE HCL (PF) 1 % IJ SOLN
20.0000 mL | Freq: Once | INTRAMUSCULAR | Status: AC
  Administered 2023-07-18: 20 mL via INTRADERMAL

## 2023-07-18 MED ORDER — MIDAZOLAM HCL 2 MG/2ML IJ SOLN
INTRAMUSCULAR | Status: AC
Start: 2023-07-18 — End: ?
  Filled 2023-07-18: qty 4

## 2023-07-18 MED ORDER — MIDAZOLAM HCL 2 MG/2ML IJ SOLN
INTRAMUSCULAR | Status: AC | PRN
  Administered 2023-07-18: 1 mg via INTRAVENOUS
  Administered 2023-07-18 (×3): .5 mg via INTRAVENOUS

## 2023-07-18 MED ORDER — SODIUM CHLORIDE 0.9 % IV SOLN: INTRAVENOUS | Status: DC

## 2023-07-18 MED ORDER — DIPHENHYDRAMINE HCL 50 MG/ML IJ SOLN
INTRAMUSCULAR | Status: AC
  Filled 2023-07-18: qty 1

## 2023-07-18 MED ORDER — FENTANYL CITRATE (PF) 100 MCG/2ML IJ SOLN
INTRAMUSCULAR | Status: AC | PRN
  Administered 2023-07-18 (×2): 50 ug via INTRAVENOUS
  Administered 2023-07-18: 25 ug via INTRAVENOUS

## 2023-07-18 MED ORDER — GELATIN ABSORBABLE 12-7 MM EX MISC
1.0000 | Freq: Once | CUTANEOUS | Status: AC
  Administered 2023-07-18: 1 via TOPICAL

## 2023-07-18 NOTE — H&P (Signed)
 Chief Complaint: Patient was seen in consultation today for liver lesions and elevated liver function tests-- planned for liver lesion aspiration and liver core biopsy at the request of Dr Gilford Raid   Supervising Physician: Gilmer Mor  Patient Status: [facility] - Out-pt  History of Present Illness: Kara [name] is a 78 y.o. 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  Patient is status post previous remote cholecystectomy.  Also, she has chronically elevated alkaline phosphatase. The ultrasound liver cyst aspiration and the ultrasound right liver and core biopsy were both reviewed with the patient.  The procedures, risk, benefits and alternatives were reviewed.  These would require that she travel to [facility] and have the procedures performed at either [facility] or [facility].  All questions were addressed.  After discussion she would like to proceed with scheduling the procedure electively in the next few weeks. 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  The STAT Breast cancer panel offered by [facility] includes sequencing and rearrangement analysis for the following 9 genes:  ATM, BRCA1, BRCA2, CDH1, CHEK2, PALB2, PTEN, STK11 and TP53.   The report date is August 09, 2019.   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 fibroids   BIOPSY  05/02/2022   Procedure: BIOPSY;  Surgeon: Lanelle Bal, DO;  [facility];  Service: Endoscopy;;   BREAST BIOPSY Left 05/2019   left breast DCIS  BREAST LUMPECTOMY Left 06/2019   DCIS   BREAST LUMPECTOMY WITH RADIOACTIVE SEED LOCALIZATION Left 07/08/2019   Procedure: LEFT BREAST LUMPECTOMY WITH RADIOACTIVE SEED LOCALIZATION;  Surgeon: Griselda Miner, MD;  Location: [facility];  Service: General;  Laterality: Left;   BREAST SURGERY N/A    Phreesia 08/30/2020   CESAREAN SECTION     CESAREAN SECTION N/A    Phreesia 08/30/2020   CHOLECYSTECTOMY     COLONOSCOPY  2011   [place]: two  3-4 mm sessile polyps, hyperplastic, sigmoid diverticula, TI appeared normal.    COLONOSCOPY WITH PROPOFOL N/A 05/02/2022   Procedure: COLONOSCOPY WITH PROPOFOL;  Surgeon: Lanelle Bal, DO;  [facility];  Service: Endoscopy;  Laterality: N/A;  11:30 am   ESOPHAGOGASTRODUODENOSCOPY  2011   [place]: non-bleeding erosive gastropathy, normal duodenum. path: unremarkable duodenum, negative H.pylori, minimal esophagitis    ESOPHAGOGASTRODUODENOSCOPY N/A 11/23/2022   Procedure: ESOPHAGOGASTRODUODENOSCOPY (EGD);  Surgeon: Lemar Lofty., MD;  Location: [facility];  Service: Gastroenterology;  Laterality: N/A;   ESOPHAGOGASTRODUODENOSCOPY (EGD) WITH PROPOFOL N/A 05/02/2022   Procedure: ESOPHAGOGASTRODUODENOSCOPY (EGD) WITH PROPOFOL;  Surgeon: Lanelle Bal, DO;  [facility];  Service: Endoscopy;  Laterality: N/A;   EUS N/A 11/23/2022   Procedure: UPPER ENDOSCOPIC ULTRASOUND (EUS) RADIAL;  Surgeon: Lemar Lofty., MD;  Location: [facility];  Service: Gastroenterology;  Laterality: N/A;   EYE SURGERY  2022   IR RADIOLOGIST EVAL & MGMT  06/20/2023   POLYPECTOMY  05/02/2022   Procedure: POLYPECTOMY;  Surgeon: Lanelle Bal, DO;  [facility];  Service: Endoscopy;;   REFRACTIVE SURGERY  2024   TONSILLECTOMY  1975    Allergies: Coconut (cocos nucifera), Other, Prednisone, Iodinated contrast media, Petrolatum, and Naproxen  Medications: Prior to Admission medications   Medication Sig Start Date End Date Taking? Authorizing Provider  acetaminophen (TYLENOL) 500 MG tablet Take 500 mg by mouth every 8 (eight) hours as needed for moderate pain (pain score 4-6).   Yes [provider]  Carboxymethylcell-Glycerin PF (REFRESH RELIEVA PF) 0.5-1 % SOLN Place 1 drop into both eyes in the morning, at noon, in the evening, and at bedtime.   Yes [provider]  hydrochlorothiazide (HYDRODIURIL) 12.5 MG tablet TAKE 1 TABLET(12.5 MG)  BY MOUTH DAILY 07/10/23  Yes Anabel Halon, MD  ibuprofen (ADVIL) 100 MG/5ML suspension Take 200 mg by mouth every 4 (four) hours as needed.   Yes [provider]  latanoprost (XALATAN) 0.005 % ophthalmic solution Place 1 drop into both eyes at bedtime. 06/06/22  Yes [provider]  Misc Natural Products (SAMBUCUS ELDERBERRY VITAMIN C MT) Take 1 each by mouth daily. Gummy   Yes [provider]  Misc. Devices MISC Compression stockings XL: 20-30 mm Hg 01/22/23  Yes Anabel Halon, MD  Multiple Vitamin (MULTIVITAMIN) capsule Take 1 capsule by mouth daily.   Yes [provider]  SYNTHROID 88 MCG tablet Take 1 tablet (88 mcg total) by mouth daily before breakfast. 07/11/23  Yes Nida, Denman George, MD  albuterol (VENTOLIN HFA) 108 (90 Base) MCG/ACT inhaler Inhale 1 puff into the lungs every 6 (six) hours as needed for wheezing or shortness of breath. 01/03/23   Anabel Halon, MD  Thiamine HCl (VITAMIN B-1 PO) Take 1 tablet by mouth in the morning.    [provider]     Family History  Problem Relation Age of Onset   Breast cancer Maternal Grandmother  dx over 50   Prostate cancer Maternal Grandfather    Arthritis Mother    COPD Mother    Breast cancer Mother 64       second at age 52   Hearing loss Mother    Cancer Father        throat   Stroke Father    Cancer Maternal Aunt 52       [name], cervical cancer   Breast cancer Maternal Aunt        dx over 71   Breast cancer Paternal Aunt        dx under 50   Prostate cancer Maternal Uncle        dx over 27   Cancer Other        father's maternal cousin was Henreietta Lacks, Cervical   Cancer Maternal Uncle        unknown cancer   Breast cancer Paternal Aunt        dx over 16   Colon cancer Neg Hx    Allergic rhinitis Neg Hx    Angioedema Neg Hx    Asthma Neg Hx    Atopy Neg Hx    Eczema Neg Hx    Urticaria Neg Hx    Immunodeficiency Neg Hx     Social History    Socioeconomic History   Marital status: Divorced    Spouse name: Not on file   Number of children: 2   Years of education: 15   Highest education level: Some college, no degree  Occupational History   Occupation: retired    Comment: Loss adjuster, chartered at a Database administrator  Tobacco Use   Smoking status: Former    Current packs/day: 0.00    Average packs/day: 0.5 packs/day for 15.0 years (7.5 ttl pk-yrs)    Types: Cigarettes    Start date: 06/16/1983    Quit date: 06/15/1998    Years since quitting: 25.1   Smokeless tobacco: Never   Tobacco comments:    quit 2002  Vaping Use   Vaping status: Never Used  Substance and Sexual Activity   Alcohol use: No   Drug use: Never   Sexual activity: Not Currently    Birth control/protection: Surgical, None    Comment: hyst  Other Topics Concern   Not on file  Social History Narrative   Lives alone   2 grown children- one in [place] and one here   Grandchildren       Moved to [place] from [place] for aging mother who is 33 with Alzheimers      Maternal Aunt is Animator of the [employer] cancer call line      Enjoy: gym, time with friends       Diet: eats all food groups   Caffeine: 2 diet cokes daily    Water: 2-4 cups daily       Wears seat belt    Does not use phone while driving    Smoke and carbon detectors at home   [employer]    No weapons       Right Handed    Lives alone in a one story home    Retired   Social Drivers of Corporate investment banker Strain: Low Risk  (06/28/2023)   Overall Financial Resource Strain (CARDIA)    Difficulty of Paying Living Expenses: Not hard at all  Food Insecurity: No Food Insecurity (06/28/2023)   Hunger Vital Sign  Worried About Programme researcher, broadcasting/film/video in the Last Year: Never true    Ran Out of Food in the Last Year: Never true  Transportation Needs: No Transportation Needs (06/28/2023)   PRAPARE - Administrator, Civil Service (Medical): No     Lack of Transportation (Non-Medical): No  Physical Activity: Sufficiently Active (06/28/2023)   Exercise Vital Sign    Days of Exercise per Week: 7 days    Minutes of Exercise per Session: 30 min  Stress: Patient Declined (06/28/2023)   [employer] of Occupational Health - Occupational Stress Questionnaire    Feeling of Stress : Patient declined  Social Connections: Moderately Integrated (06/28/2023)   Social Connection and Isolation Panel [NHANES]    Frequency of Communication with Friends and Family: More than three times a week    Frequency of Social Gatherings with Friends and Family: More than three times a week    Attends Religious Services: More than 4 times per year    Active Member of [employer] or Organizations: Yes    Attends Engineer, structural: More than 4 times per year    Marital Status: Divorced    Review of Systems: A 12 point ROS discussed and pertinent positives are indicated in the HPI above.  All other systems are negative.  Review of Systems  Constitutional:  Negative for activity change, fatigue and fever.  Respiratory:  Negative for cough and shortness of breath.   Cardiovascular:  Negative for chest pain.  Gastrointestinal:  Positive for abdominal pain. Negative for diarrhea and nausea.  Musculoskeletal:  Negative for back pain.  Psychiatric/Behavioral:  Negative for behavioral problems and confusion.     Vital Signs: BP (!) 126/57   Pulse 80   Temp 98 F (36.7 C) (Oral)   Resp 17   Ht 5\' 6"  (1.676 m)   Wt 217 lb (98.4 kg)   SpO2 98%   BMI 35.02 kg/m   Advance Care Plan: The advanced care plan/surrogate decision maker was discussed at the time of visit and documented in the medical record.    Physical Exam Vitals reviewed.  HENT:     Mouth/Throat:     Mouth: Mucous membranes are moist.  Cardiovascular:     Rate and Rhythm: Normal rate and regular rhythm.     Heart sounds: Normal heart sounds.  Pulmonary:     Effort: Pulmonary  effort is normal.     Breath sounds: No wheezing.  Abdominal:     Palpations: Abdomen is soft.     Tenderness: There is no abdominal tenderness.  Musculoskeletal:        General: Normal range of motion.  Skin:    General: Skin is warm.  Neurological:     Mental Status: She is alert and oriented to person, place, and time.  Psychiatric:        Behavior: Behavior normal.     Imaging: IR Radiologist Eval & Mgmt Result Date: 06/20/2023 : Please refer to the dictated note in [facility] from today for this IR eval and management. Electronically Signed   By: Judie Petit.  Shick M.D.   On: 06/20/2023 13:29    Labs:  CBC: Recent Labs    08/17/22 1023 07/18/23 1051  WBC 6.6 5.8  HGB 14.2 14.8  HCT 44.9 45.7  PLT 216 212    COAGS: Recent Labs    07/18/23 1051  INR 1.0    BMP: Recent Labs    08/17/22 1023 10/13/22 0845 01/16/23  1128 05/03/23 0926  NA 142 138 139 140  K 4.4 4.4 4.2 4.5  CL 106 106 102 104  CO2 20 20 23 20   GLUCOSE 124* 122* 114* 116*  BUN 17 18 16 16   CALCIUM 10.0 9.4 10.0 9.3  CREATININE 0.91 0.84 0.82 0.74    LIVER FUNCTION TESTS: Recent Labs    10/13/22 0845 11/28/22 0937 01/16/23 1128 05/03/23 0926  BILITOT 0.4 0.4 0.4 0.4  AST 18 19 23 19   ALT 18 17 23 17   ALKPHOS 140* 149* 159* 155*  PROT 6.5 6.7 6.8 6.8  ALBUMIN 3.7* 3.8 3.9 3.8    TUMOR MARKERS: No results for input(s): "AFPTM", "CEA", "CA199", "CHROMGRNA" in the last 8760 hours.  Assessment and Plan:  Scheduled for liver cyst aspiration/biopsy and Liver core biopsy Risks and benefits of liver cyst aspiration/biopsy and liver core biopsy was discussed with the patient and/or patient's family including, but not limited to bleeding, infection, damage to adjacent structures or low yield requiring additional tests.  All of the questions were answered and there is agreement to proceed. Consent signed and in chart.  Thank you for this interesting consult.  I greatly enjoyed meeting  TENNA LACKO and look forward to participating in their care.  A copy of this report was sent to the requesting provider on this date.  Electronically Signed: Robet Leu, PA-C 07/18/2023, 11:33 AM   I spent a total of  30 Minutes   in face to face in clinical consultation, greater than 50% of which was counseling/coordinating care for liver core biopsy/liver cyst aspiration/biopsy

## 2023-07-18 NOTE — Procedures (Signed)
 Interventional Radiology Procedure Note  Procedure:  US guided liver cyst aspiration, ~120cc US guided liver biopsy, medical liver  Complications: None EBL: None Recommendations: - Bedrest 2 hours.   - Routine wound care - Follow up pathology - Advance diet   Signed,  Gilmer Mor, DO

## 2023-07-19 ENCOUNTER — Encounter: Payer: Self-pay | Admitting: Orthopedic Surgery

## 2023-07-19 LAB — SURGICAL PATHOLOGY

## 2023-07-23 ENCOUNTER — Ambulatory Visit: Payer: Medicare Other | Admitting: Internal Medicine

## 2023-07-24 ENCOUNTER — Encounter: Payer: Self-pay | Admitting: Orthopedic Surgery

## 2023-07-24 ENCOUNTER — Encounter: Payer: Self-pay | Admitting: Internal Medicine

## 2023-07-24 ENCOUNTER — Ambulatory Visit: Payer: Federal, State, Local not specified - PPO | Admitting: "Endocrinology

## 2023-07-24 ENCOUNTER — Ambulatory Visit (INDEPENDENT_AMBULATORY_CARE_PROVIDER_SITE_OTHER): Admitting: Orthopedic Surgery

## 2023-07-24 DIAGNOSIS — M25561 Pain in right knee: Secondary | ICD-10-CM | POA: Diagnosis not present

## 2023-07-24 DIAGNOSIS — T148XXA Other injury of unspecified body region, initial encounter: Secondary | ICD-10-CM | POA: Diagnosis not present

## 2023-07-24 DIAGNOSIS — G8929 Other chronic pain: Secondary | ICD-10-CM | POA: Diagnosis not present

## 2023-07-24 NOTE — Progress Notes (Signed)
 Return patient Visit  Assessment: Kara [name] is a 78 y.o. female with the following: 1. Chronic pain of right knee; mild to moderate arthritis  Plan: Kara [name] continues to have pain in her right knee.  Overall, symptoms are waxing and waning.  We reviewed the MRI in clinic today, which demonstrates insufficiency fracture of the lateral tibial plateau.  As a result, I recommended a brace, protected weightbearing with a walker for the next 6 weeks.  She states understanding.   Follow-up: Return in about 6 weeks (around 09/04/2023).  Subjective:  Chief Complaint  Patient presents with   Results    R knee MRI results. Pt states pain has been back and forth.    History of Present Illness: Kara [name] is a 78 y.o. female who returns for evaluation of right knee pain.  I have seen her several times for pain in her right knee.  No specific injury.  She states that the pain comes and goes.  She felt much better for most of the week last week.  However, towards the end of the week, she increased her level of activity.  This is caused her pain.  She is having pain in the anterior and lateral aspect of the knee, as well as the posterior knee.  Medications if not been effective, and she is reluctant to rely on medications.  Prior injection did not help with her pain.  She continues to have swelling in bilateral lower extremities.  She is using compression stockings.  She notes the elastic band digs into her skin towards the end of the day.   Review of Systems: No fevers or chills No numbness or tingling No chest pain No shortness of breath No bowel or bladder dysfunction No GI distress No headaches    Objective: There were no vitals taken for this visit.  Physical Exam:  General: Alert and oriented. and No acute distress. Gait: Right sided antalgic gait.  Bilateral lower extremities with diffuse swelling.  No redness.  Mild effusion of the right knee.  Pain with  flexion beyond 90 degrees.  Severe tenderness to palpation along the medial joint line.  Mild tenderness to palpation within the posterior knee.  Negative Lachman.  No increased laxity varus valgus stress.  IMAGING: I personally ordered and reviewed the following images   Right knee MRI  IMPRESSION: 1. New prominent marrow edema throughout the lateral tibial plateau, greatest posteriorly, with questionable underlying subchondral insufficiency fracture. 2. The lateral meniscus appears degenerated with mild free edge fraying, although no discrete meniscal tear or displaced meniscal fragment is demonstrated. No residual parameniscal cyst is seen. 3. The medial meniscus, cruciate and collateral ligaments are intact. 4. 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 5. Small joint effusion and small Baker's cyst.  New Medications:  No orders of the defined types were placed in this encounter.     Oliver Barre, MD  07/24/2023 9:41 AM

## 2023-07-24 NOTE — Patient Instructions (Signed)
 Protected weightbearing for 6 weeks  Use a walker  Brace when walking

## 2023-08-03 ENCOUNTER — Telehealth (INDEPENDENT_AMBULATORY_CARE_PROVIDER_SITE_OTHER): Payer: Self-pay | Admitting: Internal Medicine

## 2023-08-03 NOTE — Telephone Encounter (Signed)
 Patient called the [employer] office asking about her results. [phone]

## 2023-08-06 ENCOUNTER — Telehealth (INDEPENDENT_AMBULATORY_CARE_PROVIDER_SITE_OTHER): Payer: Self-pay | Admitting: Internal Medicine

## 2023-08-06 ENCOUNTER — Encounter: Payer: Self-pay | Admitting: Internal Medicine

## 2023-08-06 ENCOUNTER — Encounter: Payer: Self-pay | Admitting: *Deleted

## 2023-08-06 NOTE — Telephone Encounter (Signed)
 See previous note

## 2023-08-06 NOTE — Telephone Encounter (Signed)
 Per Dr. Marletta Lor. Her liver biopsy looked great. Very mild findings. No evidence of scarring/cirrhosis.

## 2023-08-06 NOTE — Telephone Encounter (Signed)
 Pt called again asking about her results done on 3/5. Please call [phone]

## 2023-08-06 NOTE — Telephone Encounter (Signed)
 Pt is wanting to know the results to her biopsy. Please advise.

## 2023-08-08 ENCOUNTER — Ambulatory Visit: Payer: Medicare Other | Admitting: Internal Medicine

## 2023-08-10 DIAGNOSIS — D0512 Intraductal carcinoma in situ of left breast: Secondary | ICD-10-CM | POA: Diagnosis not present

## 2023-08-15 ENCOUNTER — Encounter: Payer: Self-pay | Admitting: Internal Medicine

## 2023-09-04 ENCOUNTER — Encounter: Payer: Self-pay | Admitting: Orthopedic Surgery

## 2023-09-04 ENCOUNTER — Ambulatory Visit (INDEPENDENT_AMBULATORY_CARE_PROVIDER_SITE_OTHER): Admitting: Orthopedic Surgery

## 2023-09-04 VITALS — BP 95/61 | HR 93

## 2023-09-04 DIAGNOSIS — M25561 Pain in right knee: Secondary | ICD-10-CM

## 2023-09-04 DIAGNOSIS — T148XXA Other injury of unspecified body region, initial encounter: Secondary | ICD-10-CM | POA: Diagnosis not present

## 2023-09-04 DIAGNOSIS — G8929 Other chronic pain: Secondary | ICD-10-CM | POA: Diagnosis not present

## 2023-09-04 NOTE — Progress Notes (Signed)
 Return patient Visit  Assessment: Kara [name] is a 78 y.o. female with the following: 1. Chronic pain of right knee; mild to moderate arthritis  Plan: Kara [name] is feeling better.  Pain and tenderness in the right knee is improved compared to the last visit.  Was again, we reviewed the findings of the MRI.  At this point, it is okay for her to advance her activities.  She no longer needs to use a brace.  She can wean herself off the use of a cane.  She states understanding.  She is excited to start walking again.  Follow-up: Return if symptoms worsen or fail to improve.  Subjective:  No chief complaint on file.   History of Present Illness: Kara [name] is a 78 y.o. female who returns for evaluation of right knee pain.  I saw her in clinic approximate 6 weeks ago.  At that time, we reviewed an MRI, which demonstrated increased signal within the lateral tibial plateau.  This represented an insufficiency fracture.  Since that, she has been wearing a brace.  She has been using a walker.  She reports that she feels better.  The tenderness has improved.  Review of Systems: No fevers or chills No numbness or tingling No chest pain No shortness of breath No bowel or bladder dysfunction No GI distress No headaches    Objective: BP 95/61   Pulse 93   Physical Exam:  General: Alert and oriented. and No acute distress. Gait: Right sided antalgic gait.  Bilateral lower extremities with diffuse swelling.  No redness.  Mild effusion of the right knee.  She is able to maintain straight leg raise.  She tolerates flexion beyond 90 degrees with minimal discomfort.  Minimal tenderness to palpation along the medial joint line, as well as within the tibial plateau.  IMAGING: I personally ordered and reviewed the following images   No new imaging obtained today.   New Medications:  No orders of the defined types were placed in this encounter.     Tonita Frater,  MD  09/04/2023 1:25 PM

## 2023-09-09 ENCOUNTER — Encounter: Payer: Self-pay | Admitting: Orthopedic Surgery

## 2023-09-13 ENCOUNTER — Ambulatory Visit (INDEPENDENT_AMBULATORY_CARE_PROVIDER_SITE_OTHER): Admitting: Internal Medicine

## 2023-09-13 ENCOUNTER — Encounter: Payer: Self-pay | Admitting: Internal Medicine

## 2023-09-13 VITALS — BP 107/72 | HR 71 | Temp 97.6°F | Ht 66.0 in | Wt 225.6 lb

## 2023-09-13 DIAGNOSIS — K760 Fatty (change of) liver, not elsewhere classified: Secondary | ICD-10-CM

## 2023-09-13 DIAGNOSIS — K7689 Other specified diseases of liver: Secondary | ICD-10-CM

## 2023-09-13 DIAGNOSIS — R748 Abnormal levels of other serum enzymes: Secondary | ICD-10-CM | POA: Diagnosis not present

## 2023-09-13 DIAGNOSIS — R195 Other fecal abnormalities: Secondary | ICD-10-CM | POA: Diagnosis not present

## 2023-09-13 DIAGNOSIS — R1011 Right upper quadrant pain: Secondary | ICD-10-CM

## 2023-09-13 DIAGNOSIS — E782 Mixed hyperlipidemia: Secondary | ICD-10-CM | POA: Diagnosis not present

## 2023-09-13 DIAGNOSIS — K8689 Other specified diseases of pancreas: Secondary | ICD-10-CM

## 2023-09-13 DIAGNOSIS — Z860101 Personal history of adenomatous and serrated colon polyps: Secondary | ICD-10-CM | POA: Diagnosis not present

## 2023-09-13 DIAGNOSIS — K219 Gastro-esophageal reflux disease without esophagitis: Secondary | ICD-10-CM

## 2023-09-13 DIAGNOSIS — E890 Postprocedural hypothyroidism: Secondary | ICD-10-CM | POA: Diagnosis not present

## 2023-09-13 NOTE — Patient Instructions (Addendum)
 I am happy to hear that you are feeling better.  Will continue to monitor for now.  Follow-up in 3 months.  It is always a pleasure seeing you.  Dr. Mordechai April

## 2023-09-13 NOTE — Progress Notes (Signed)
 Referring Provider: Meldon Sport, MD Primary Care Physician:  Meldon Sport, MD Primary GI:  Dr. Mordechai April  Chief Complaint  Patient presents with   Follow-up    Pt here for 3 month follow up for GERD, elevated LFT's    HPI:   Kara [name] is a 78 y.o. female who presents to clinic today for follow up visit. She has a history of abdominal pain, GERD, adenomatous colon polyps.  Extensive workup for her chronic right upper quadrant abdominal pain and chronically elevated alkaline phosphatase level as below:  EGD 04/2022: -2cm hh -mild Schatzki ring -gastritis, reactive gastropathy, no H.pylori -congested ampulla, mild acute duodenitis   Colonoscopy 04/2022: -diverticulosis -five polyps removed, tubular adenomas -Recall 3 years  EUS 11/23/2022 -Normal CBD, no choledocho, normal ampulla, normal pancreatic duct, ?fatty pancreas, multiple liver cysts.  CT abdomen pelvis with contrast 08/24/2022 mild pelvic lymphadenopathy bilateral iliac regions consider follow-up CT versus PET/CT or tissue sampling.  Followed up with her oncologist who recommended repeat CT in 3 months  CT chest abdomen pelvis with contrast 12/07/2022 overall unremarkable, lymphadenopathy resolved.  Multiple liver cysts, largest 5 cm  Elevated alk phos: GGT elevated at 74, lipase 29, AMA negative  She has had chronic intermittent right upper quadrant pain off and on for years.  Worse with certain foods.  Sore to touch, burning in quality.  Pepcid helped a little previously.  Gallbladder removed remotely for this pain but did not make it any better.  Took pantoprazole  without improvement.  Pain sometimes sharp, sometimes pressure.  Occurs every day  Also notes chronically loose stools, 1-2 bowel movements per day.  Has not had a formed stool in over a year.  Was given samples of pancreatic enzymes on previous visit.  She states these helped though she has a very difficult time taking medications  Underwent  IR drainage of liver cyst 07/18/2023 as well as liver biopsy.  Liver pathology: Liver with focal minimal parenchymal inflammation with rare foamy histiocytes, see note.  Note: It is noted that the patient has had a persistent mildly elevated alkaline phosphatase as well as a GGT.  Overall there is very minimal liver findings with mild lobular disarray focal minimal parenchymal inflammation with focal histiocytes and very focal minimal bile duct reactive type change (not at all pathognomonic).  There is minimal steatosis (<5%).  There is no fibrosis.  There is no stainable iron. PAS stain is unremarkable.  A reticulin stain shows normal plate thickness.  Overall, there is no evidence of chronic hepatitis or steatohepatitis in the above findings are very mild and nonspecific, in such circumstances a mild drug-induced liver injury (DILI) may be considered but is also not pathognomonic.   Today, she states overall her right upper quadrant abdominal pain is improved.  Prior to drainage if her pain level was a 10, it is currently a 3-4.  She is happy with her results.  Pain is still present though she can lay on her right side now.   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  The STAT Breast cancer panel offered by [facility] includes sequencing and rearrangement analysis for the following 9 genes:  ATM, BRCA1, BRCA2, CDH1, CHEK2, PALB2,  PTEN, STK11 and TP53.   The report date is August 09, 2019.   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 fibroids   BIOPSY  05/02/2022   Procedure: BIOPSY;  Surgeon: Vinetta Greening, DO;  Location: [facility];  Service: Endoscopy;;   BREAST BIOPSY Left 05/2019   left breast DCIS   BREAST LUMPECTOMY Left 06/2019   DCIS   BREAST LUMPECTOMY WITH RADIOACTIVE SEED LOCALIZATION Left 07/08/2019   Procedure: LEFT BREAST LUMPECTOMY WITH RADIOACTIVE SEED LOCALIZATION;  Surgeon: Caralyn Chandler, MD;  Location: [facility];  Service: General;  Laterality: Left;   BREAST SURGERY N/A    Phreesia 08/30/2020   CESAREAN SECTION     CESAREAN SECTION N/A    [employer]reesia 08/30/2020   CHOLECYSTECTOMY     COLONOSCOPY  2011   [place] : two 3-4 mm sessile polyps, hyperplastic, sigmoid diverticula, TI appeared normal.    COLONOSCOPY WITH PROPOFOL  N/A 05/02/2022   Procedure: COLONOSCOPY WITH PROPOFOL ;  Surgeon: Vinetta Greening, DO;  Location: [facility];  Service: Endoscopy;  Laterality: N/A;  11:30 am   ESOPHAGOGASTRODUODENOSCOPY  2011   [place] : non-bleeding  erosive gastropathy, normal duodenum. path: unremarkable duodenum, negative H.pylori, minimal esophagitis    ESOPHAGOGASTRODUODENOSCOPY N/A 11/23/2022   Procedure: ESOPHAGOGASTRODUODENOSCOPY (EGD);  Surgeon: Normie Becton., MD;  Location: [facility];  Service: Gastroenterology;  Laterality: N/A;   ESOPHAGOGASTRODUODENOSCOPY (EGD) WITH PROPOFOL  N/A 05/02/2022   Procedure: ESOPHAGOGASTRODUODENOSCOPY (EGD) WITH PROPOFOL ;  Surgeon: Vinetta Greening, DO;  Location: [facility];  Service: Endoscopy;  Laterality: N/A;   EUS N/A 11/23/2022   Procedure: UPPER ENDOSCOPIC ULTRASOUND (EUS) RADIAL;  Surgeon: Normie Becton., MD;  Location: [facility];  Service: Gastroenterology;  Laterality: N/A;   EYE SURGERY  2022   IR RADIOLOGIST EVAL & MGMT  06/20/2023   POLYPECTOMY  05/02/2022   Procedure: POLYPECTOMY;  Surgeon: Vinetta Greening, DO;  Location: [facility];  Service: Endoscopy;;   REFRACTIVE SURGERY  2024   TONSILLECTOMY  1975    Current Outpatient Medications  Medication Sig Dispense Refill   acetaminophen  (TYLENOL ) 500 MG tablet Take 500 mg by mouth every 8 (eight) hours as needed for moderate pain (pain score  4-6).     albuterol  (VENTOLIN  HFA) 108 (90 Base) MCG/ACT inhaler Inhale 1 puff into the lungs every 6 (six) hours as needed for wheezing or shortness of breath. 6.7 g 2   Carboxymethylcell-Glycerin PF (REFRESH RELIEVA PF) 0.5-1 % SOLN Place 1 drop into both eyes in the morning, at noon, in the evening, and at bedtime.     hydrochlorothiazide  (HYDRODIURIL ) 12.5 MG tablet TAKE 1 TABLET(12.5 MG) BY MOUTH DAILY 30 tablet 0   ibuprofen  (ADVIL ) 100 MG/5ML suspension Take 200 mg by mouth every 4 (four) hours as needed.     latanoprost (XALATAN) 0.005 % ophthalmic solution Place 1 drop into both eyes at bedtime.     Misc Natural Products (SAMBUCUS ELDERBERRY VITAMIN C MT) Take 1 each by mouth daily. Gummy     Misc. Devices MISC Compression stockings XL: 20-30 mm Hg 1 each 0    Multiple Vitamin (MULTIVITAMIN) capsule Take 1 capsule by mouth daily.     SYNTHROID  88 MCG tablet Take 1 tablet (88 mcg total) by mouth daily before breakfast. 90 tablet 1   Thiamine  HCl (VITAMIN B-1 PO) Take 1 tablet by mouth in the morning.     No current facility-administered medications for this visit.    Allergies as of 09/13/2023 - Review Complete 09/13/2023  Allergen Reaction Noted   Coconut (cocos nucifera) Anaphylaxis 06/30/2020   Other Shortness Of Breath 01/20/2011   Prednisone  Hypertension 12/03/2018   Iodinated contrast media Swelling 12/04/2022   Petrolatum Dermatitis 04/28/2022   Naproxen  Other (See Comments) 11/09/2015    Family History  Problem Relation Age of Onset   Breast cancer Maternal Grandmother        dx over 67   Prostate cancer Maternal Grandfather    Arthritis Mother    COPD Mother    Breast cancer Mother 36       second at age 69   Hearing loss Mother    Cancer Father        throat   Stroke Father    Cancer Maternal Aunt 24       [name], cervical cancer   Breast cancer Maternal Aunt        dx over 11   Breast cancer Paternal Aunt        dx under 50   Prostate cancer Maternal Uncle        dx over 46   Cancer Other        father's maternal cousin was Henreietta Lacks, Cervical   Cancer Maternal Uncle        unknown cancer   Breast cancer Paternal Aunt        dx over 32   Colon cancer Neg Hx    Allergic rhinitis Neg Hx    Angioedema Neg Hx    Asthma Neg Hx    Atopy Neg Hx    Eczema Neg Hx    Urticaria Neg Hx    Immunodeficiency Neg Hx     Social History   Socioeconomic History   Marital status: Divorced    Spouse name: Not on file   Number of children: 2   Years of education: 15   Highest education level: Some college, no degree  Occupational History   Occupation: retired    Comment: Loss adjuster, chartered at a [employer]  Tobacco Use   Smoking status: Former    Current packs/day: 0.00    Average  packs/day: 0.5 packs/day for 15.0 years (7.5 ttl pk-yrs)  Types: Cigarettes    Start date: 06/16/1983    Quit date: 06/15/1998    Years since quitting: 25.2   Smokeless tobacco: Never   Tobacco comments:    quit 2002  Vaping Use   Vaping status: Never Used  Substance and Sexual Activity   Alcohol use: No   Drug use: Never   Sexual activity: Not Currently    Birth control/protection: Surgical, None    Comment: hyst  Other Topics Concern   Not on file  Social History Narrative   Lives alone   2 grown children- one in [place] and one here   Grandchildren       Moved to [place] from [place] for aging mother who is 21 with Alzheimers      Maternal Aunt is Animator of the [employer] cancer call line      Enjoy: gym, time with friends       Diet: eats all food groups   Caffeine: 2 diet cokes daily    Water : 2-4 cups daily       Wears seat belt    Does not use phone while driving    Smoke and carbon detectors at home   [employer]    No weapons       Right Handed    Lives alone in a one story home    Retired   Social Drivers of Corporate investment banker Strain: Low Risk  (06/28/2023)   Overall Financial Resource Strain (CARDIA)    Difficulty of Paying Living Expenses: Not hard at all  Food Insecurity: No Food Insecurity (06/28/2023)   Hunger Vital Sign    Worried About Running Out of Food in the Last Year: Never true    Ran Out of Food in the Last Year: Never true  Transportation Needs: No Transportation Needs (06/28/2023)   PRAPARE - Administrator, Civil Service (Medical): No    Lack of Transportation (Non-Medical): No  Physical Activity: Sufficiently Active (06/28/2023)   Exercise Vital Sign    Days of Exercise per Week: 7 days    Minutes of Exercise per Session: 30 min  Stress: Patient Declined (06/28/2023)   [employer] of Occupational Health - Occupational Stress Questionnaire    Feeling of Stress : Patient declined  Social  Connections: Moderately Integrated (06/28/2023)   Social Connection and Isolation Panel [NHANES]    Frequency of Communication with Friends and Family: More than three times a week    Frequency of Social Gatherings with Friends and Family: More than three times a week    Attends Religious Services: More than 4 times per year    Active Member of [employer] or Organizations: Yes    Attends Engineer, structural: More than 4 times per year    Marital Status: Divorced    Subjective: Review of Systems  Constitutional:  Negative for chills and fever.  HENT:  Negative for congestion and hearing loss.   Eyes:  Negative for blurred vision and double vision.  Respiratory:  Negative for cough and shortness of breath.   Cardiovascular:  Negative for chest pain and palpitations.  Gastrointestinal:  Positive for abdominal pain. Negative for blood in stool, constipation, diarrhea, heartburn, melena and vomiting.  Genitourinary:  Negative for dysuria and urgency.  Musculoskeletal:  Negative for joint pain and myalgias.  Skin:  Negative for itching and rash.  Neurological:  Negative for dizziness and headaches.  Psychiatric/Behavioral:  Negative for depression. The  patient is not nervous/anxious.      Objective: BP 107/72   Pulse 71   Temp 97.6 F (36.4 C)   Ht 5\' 6"  (1.676 m)   Wt 225 lb 9.6 oz (102.3 kg)   BMI 36.41 kg/m  Physical Exam Constitutional:      Appearance: Normal appearance.  HENT:     Head: Normocephalic and atraumatic.  Eyes:     Extraocular Movements: Extraocular movements intact.     Conjunctiva/sclera: Conjunctivae normal.  Cardiovascular:     Rate and Rhythm: Normal rate and regular rhythm.  Pulmonary:     Effort: Pulmonary effort is normal.     Breath sounds: Normal breath sounds.  Abdominal:     General: Bowel sounds are normal.     Palpations: Abdomen is soft.  Musculoskeletal:        General: No swelling. Normal range of motion.     Cervical back: Normal  range of motion and neck supple.  Skin:    General: Skin is warm and dry.     Coloration: Skin is not jaundiced.  Neurological:     General: No focal deficit present.     Mental Status: She is alert and oriented to person, place, and time.  Psychiatric:        Mood and Affect: Mood normal.        Behavior: Behavior normal.      Assessment/Plan:  1.  Right upper quadrant abdominal pain-not improved with pantoprazole  or Pepcid.  EUS largely unremarkable.  Noted improvement from recent drainage of her liver cyst.  Discussed that the cyst will likely come back though we at least have our answer as to what could potentially be causing her abdominal pain.  Offered referral to hepatobiliary surgery to discuss more definitive treatment and she would like to continue to monitor for now.  2.  Elevated alk phos-significant prior workup.  Liver biopsy overall with minimal findings besides hepatic steatosis.    Recommend 1-2# weight loss per week until ideal body weight through exercise & diet. Low fat/cholesterol diet.   Avoid sweets, sodas, fruit juices, sweetened beverages like tea, etc. Gradually increase exercise from 15 min daily up to 1 hr per day 5 days/week. Limit alcohol use.  3.  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  Trial of pancreatic enzymes did seem to help though patient states it was difficult for her to remember to take these medications as instructed.  4.  History of adenomatous colon polyps-colonoscopy recall December 2026 if benefits outweigh risk.   Follow-up in 3 months  09/13/2023 9:59 AM   Disclaimer: This note was dictated with voice recognition software. Similar sounding words can inadvertently be transcribed and may not be corrected upon review.

## 2023-09-14 LAB — LIPID PANEL
Chol/HDL Ratio: 3.6 ratio (ref 0.0–4.4)
Cholesterol, Total: 202 mg/dL — ABNORMAL HIGH (ref 100–199)
HDL: 56 mg/dL (ref >39)
LDL Chol Calc (NIH): 137 mg/dL — ABNORMAL HIGH (ref 0–99)
Triglycerides: 51 mg/dL (ref 0–149)
VLDL Cholesterol Cal: 9 mg/dL (ref 5–40)

## 2023-09-14 LAB — T4, FREE: Free T4: 1.47 ng/dL (ref 0.82–1.77)

## 2023-09-14 LAB — TSH: TSH: 4.9 u[IU]/mL — ABNORMAL HIGH (ref 0.450–4.500)

## 2023-09-24 ENCOUNTER — Encounter: Payer: Self-pay | Admitting: "Endocrinology

## 2023-09-24 NOTE — Telephone Encounter (Signed)
 Patient already come by here. Can this be looked at by clinical due to her labs  thanks

## 2023-09-28 ENCOUNTER — Other Ambulatory Visit: Payer: Self-pay

## 2023-09-28 DIAGNOSIS — E890 Postprocedural hypothyroidism: Secondary | ICD-10-CM

## 2023-09-28 MED ORDER — SYNTHROID 100 MCG PO TABS: 100.0000 ug | ORAL_TABLET | Freq: Every day | ORAL | 0 refills | Status: DC

## 2023-10-01 ENCOUNTER — Encounter (HOSPITAL_BASED_OUTPATIENT_CLINIC_OR_DEPARTMENT_OTHER): Payer: Self-pay | Admitting: Pulmonary Disease

## 2023-10-01 ENCOUNTER — Ambulatory Visit (HOSPITAL_BASED_OUTPATIENT_CLINIC_OR_DEPARTMENT_OTHER): Payer: Medicare Other | Admitting: Pulmonary Disease

## 2023-10-01 VITALS — BP 124/72 | HR 67 | Ht 66.0 in | Wt 226.2 lb

## 2023-10-01 DIAGNOSIS — Z87891 Personal history of nicotine dependence: Secondary | ICD-10-CM

## 2023-10-01 DIAGNOSIS — G4733 Obstructive sleep apnea (adult) (pediatric): Secondary | ICD-10-CM

## 2023-10-01 NOTE — Progress Notes (Signed)
 Subjective:    Patient ID: Kara [name], female    DOB: 1945/09/09, 78 y.o.   MRN: [id number]  HPI  Chief Complaint  Patient presents with   Consult    Sleep consult, referred by PCP, sleep study done at home, pt states they are "suffocating" with CPAP machine     78 year old female who presents with sleep disturbances and difficulty tolerating CPAP therapy. She was referred by Dr. Lydia Sams for evaluation of her sleep disturbances.  She experiences significant sleep disturbances with frequent awakenings throughout the night, primarily due to nocturia. Despite spending a significant amount of time in bed, her sleep is fragmented, and she does not feel rested upon waking. Daytime sleepiness is present, with occasional naps.  An at-home sleep study last year recommended CPAP therapy, but she finds the CPAP mask uncomfortable, describing a sensation of 'suffocating', and has discontinued its use. The sleep study showed about ten apnea events per hour with oxygen  levels dropping to 88%.  Nocturia occurs four to five times a night, attributed to fluid mobilization. She takes a diuretic around noon but not daily due to concerns about low blood pressure. She also takes thyroid  medication earlier in the day.  Her past medical history includes glaucoma and lymphedema. She manages leg swelling with compression stockings and elevates her feet, which also contributes to nocturnal awakenings. She prefers to sleep on her side, uses two pillows, and often reads in bed during the night. She has no morning headaches but experiences a dry mouth upon waking. She lives alone and is unsure if she snores.   Significant tests/ events reviewed  HST 08/2022 >>  Review of Systems Constitutional: negative for anorexia, fevers and sweats  Eyes: negative for irritation, redness and visual disturbance  Ears, nose, mouth, throat, and face: negative for earaches, epistaxis, nasal congestion and sore throat   Respiratory: negative for cough, dyspnea on exertion, sputum and wheezing  Cardiovascular: negative for chest pain, dyspnea, lower extremity edema, orthopnea, palpitations and syncope  Gastrointestinal: negative for abdominal pain, constipation, diarrhea, melena, nausea and vomiting  Genitourinary:negative for dysuria, frequency and hematuria  Hematologic/lymphatic: negative for bleeding, easy bruising and lymphadenopathy  Musculoskeletal:negative for arthralgias, muscle weakness and stiff joints  Neurological: negative for coordination problems, gait problems, headaches and weakness  Endocrine: negative for diabetic symptoms including polydipsia, polyuria and weight loss     Objective:   Physical Exam  Gen. Pleasant, obese, in no distress ENT - no lesions, no post nasal drip Neck: No JVD, no thyromegaly, no carotid bruits Lungs: no use of accessory muscles, no dullness to percussion, decreased without rales or rhonchi  Cardiovascular: Rhythm regular, heart sounds  normal, no murmurs or gallops, no peripheral edema Musculoskeletal: No deformities, no cyanosis or clubbing , no tremors       Assessment & Plan:  Mild Obstructive Sleep Apnea Mild obstructive sleep apnea with approximately ten events per hour and oxygen  desaturation to 88%. Symptoms include interrupted sleep due to nocturia, which may not be directly related to sleep apnea. CPAP therapy was previously recommended but not tolerated due to feelings of suffocation. She does not experience excessive daytime sleepiness, and mild sleep apnea is unlikely to significantly impact cardiovascular health. Alternatives to CPAP include nasal pillows and a dental mouth guard.  - Recommend trial of nasal pillows as an alternative to full mask CPAP. - If unable to tolerate Discontinue CPAP therapy due to intolerance. - Discuss potential use of a dental mouth guard  with dentist for sleep apnea management. - Advise on sleep hygiene  practices, including reducing time in bed and avoiding TV in bed. - Consider trial of melatonin, taken two hours before desired sleep time, to aid in sleep initiation.  Lymphedema Chronic lymphedema with persistent edema despite compression stockings and leg elevation. Diuretic use is limited due to hypotension. Nocturia is exacerbated by fluid mobilization when prone, contributing to sleep disturbances. - Consider adjusting timing of diuretic to earlier in the day to minimize nocturia. - Continue use of compression stockings and leg elevation to manage edema.

## 2023-10-01 NOTE — Progress Notes (Signed)

## 2023-10-01 NOTE — Patient Instructions (Addendum)
  VISIT SUMMARY:  Today, we discussed your sleep disturbances and difficulty with CPAP therapy. You experience frequent awakenings at night, primarily due to nocturia, and have discontinued CPAP due to discomfort. We also reviewed your lymphedema and glaucoma management.  YOUR PLAN:  -MILD OBSTRUCTIVE SLEEP APNEA: Mild obstructive sleep apnea means you have brief periods during sleep when your breathing stops or becomes very shallow, which can lower your oxygen  levels. Since you find the CPAP mask uncomfortable, we will discontinue its use and try nasal pillows instead. You should also discuss the option of a dental mouth guard with your dentist. Additionally, practice good sleep habits like reducing time in bed and avoiding watching TV in bed. You may also try taking melatonin two hours before your desired sleep time to help you fall asleep.  Rules of sleep hygiene were discussed  - light exercise -avoid caffeinated beverages - no more than 20 mins staying awake in bed, if not asleep, get out of bed to chair

## 2023-10-09 ENCOUNTER — Ambulatory Visit (INDEPENDENT_AMBULATORY_CARE_PROVIDER_SITE_OTHER): Payer: Medicare Other | Admitting: "Endocrinology

## 2023-10-09 ENCOUNTER — Encounter: Payer: Self-pay | Admitting: "Endocrinology

## 2023-10-09 VITALS — BP 110/66 | HR 64 | Ht 66.0 in | Wt 226.0 lb

## 2023-10-09 DIAGNOSIS — E890 Postprocedural hypothyroidism: Secondary | ICD-10-CM

## 2023-10-09 DIAGNOSIS — E782 Mixed hyperlipidemia: Secondary | ICD-10-CM | POA: Diagnosis not present

## 2023-10-09 DIAGNOSIS — R7303 Prediabetes: Secondary | ICD-10-CM

## 2023-10-09 LAB — POCT GLYCOSYLATED HEMOGLOBIN (HGB A1C): HbA1c, POC (prediabetic range): 6.4 % (ref 5.7–6.4)

## 2023-10-09 MED ORDER — METFORMIN HCL ER 500 MG PO TB24: 500.0000 mg | ORAL_TABLET | Freq: Every day | ORAL | 1 refills | Status: DC

## 2023-10-09 NOTE — Patient Instructions (Signed)

## 2023-10-09 NOTE — Progress Notes (Signed)
 10/09/2023, 2:15 PM                         Endocrinology follow-up note   Kara [name] is a 78 y.o.-year-old female patient being seen in follow-up for management of  RAI induced hypothyroidism referred by Meldon Sport, MD.   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  The STAT Breast cancer panel offered by [facility] includes sequencing and rearrangement analysis for the following 9 genes:  ATM, BRCA1, BRCA2, CDH1, CHEK2, PALB2, PTEN, STK11 and TP53.   The report date is August 09, 2019.   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 fibroids   BIOPSY  05/02/2022   Procedure: BIOPSY;  Surgeon: Vinetta Greening, DO;  [facility];  Service: Endoscopy;;   BREAST BIOPSY Left 05/2019   left breast DCIS   BREAST LUMPECTOMY Left 06/2019   DCIS   BREAST LUMPECTOMY WITH RADIOACTIVE SEED LOCALIZATION Left 07/08/2019   Procedure: LEFT BREAST LUMPECTOMY WITH RADIOACTIVE SEED LOCALIZATION;  Surgeon: Caralyn Chandler, MD;  Location: [facility];  Service: General;  Laterality: Left;   BREAST SURGERY N/A    Phreesia 08/30/2020   CESAREAN SECTION     CESAREAN SECTION N/A  Phreesia 08/30/2020   CHOLECYSTECTOMY     COLONOSCOPY  2011   [place] : two 3-4 mm sessile polyps, hyperplastic, sigmoid diverticula, TI appeared normal.    COLONOSCOPY WITH PROPOFOL  N/A 05/02/2022   Procedure: COLONOSCOPY WITH PROPOFOL ;  Surgeon: Vinetta Greening, DO;  [facility]: [facility];  Service: Endoscopy;  Laterality: N/A;  11:30 am   ESOPHAGOGASTRODUODENOSCOPY  2011   [place] : non-bleeding erosive gastropathy, normal duodenum. path: unremarkable duodenum, negative H.pylori, minimal esophagitis    ESOPHAGOGASTRODUODENOSCOPY N/A 11/23/2022   Procedure: ESOPHAGOGASTRODUODENOSCOPY (EGD);  Surgeon: Normie Becton., MD;  Location: [facility];  Service: Gastroenterology;  Laterality: N/A;   ESOPHAGOGASTRODUODENOSCOPY (EGD) WITH PROPOFOL  N/A 05/02/2022   Procedure: ESOPHAGOGASTRODUODENOSCOPY (EGD) WITH PROPOFOL ;  Surgeon: Vinetta Greening, DO;  [facility];  Service: Endoscopy;  Laterality: N/A;   EUS N/A 11/23/2022   Procedure: UPPER ENDOSCOPIC ULTRASOUND (EUS) RADIAL;  Surgeon: Normie Becton., MD;  Location: [facility];  Service: Gastroenterology;  Laterality: N/A;   EYE SURGERY  2022   IR RADIOLOGIST EVAL & MGMT  06/20/2023   POLYPECTOMY  05/02/2022    Procedure: POLYPECTOMY;  Surgeon: Vinetta Greening, DO;  [facility];  Service: Endoscopy;;   REFRACTIVE SURGERY  2024   TONSILLECTOMY  1975    Social History   Socioeconomic History   Marital status: Divorced    Spouse name: Not on file   Number of children: 2   Years of education: 15   Highest education level: Some college, no degree  Occupational History   Occupation: retired    Comment: Loss adjuster, chartered at a Database administrator  Tobacco Use   Smoking status: Former    Current packs/day: 0.00    Average packs/day: 0.5 packs/day for 15.0 years (7.5 ttl pk-yrs)    Types: Cigarettes    Start date: 06/16/1983    Quit date: 06/15/1998    Years since quitting: 25.3   Smokeless tobacco: Never   Tobacco comments:    quit 2002  Vaping Use   Vaping status: Never Used  Substance and Sexual Activity   Alcohol use: No   Drug use: Never   Sexual activity: Not Currently    Birth control/protection: Surgical, None    Comment: hyst  Other Topics Concern   Not on file  Social History Narrative   Lives alone   2 grown children- one in [place] and one here   Grandchildren       Moved to [place] from [place] for aging mother who is 26 with Alzheimers      Maternal Aunt is Animator of the [employer] cancer call line      Enjoy: gym, time with friends       Diet: eats all food groups   Caffeine: 2 diet cokes daily    Water : 2-4 cups daily       Wears seat belt    Does not use phone while driving    Smoke and carbon detectors at home   [employer]    No weapons       Right Handed    Lives alone in a one story home    Retired   Social Drivers of Corporate investment banker Strain: Low Risk  (06/28/2023)   Overall Financial Resource Strain (CARDIA)    Difficulty of Paying Living Expenses: Not hard at all  Food Insecurity: No Food Insecurity (06/28/2023)   Hunger Vital Sign    Worried About Running  Out of Food in the Last Year: Never true     Ran Out of Food in the Last Year: Never true  Transportation Needs: No Transportation Needs (06/28/2023)   PRAPARE - Administrator, Civil Service (Medical): No    Lack of Transportation (Non-Medical): No  Physical Activity: Sufficiently Active (06/28/2023)   Exercise Vital Sign    Days of Exercise per Week: 7 days    Minutes of Exercise per Session: 30 min  Stress: Patient Declined (06/28/2023)   [employer] of Occupational Health - Occupational Stress Questionnaire    Feeling of Stress : Patient declined  Social Connections: Moderately Integrated (06/28/2023)   Social Connection and Isolation Panel [NHANES]    Frequency of Communication with Friends and Family: More than three times a week    Frequency of Social Gatherings with Friends and Family: More than three times a week    Attends Religious Services: More than 4 times per year    Active Member of [employer] or Organizations: Yes    Attends Engineer, structural: More than 4 times per year    Marital Status: Divorced    Family History  Problem Relation Age of Onset   Breast cancer Maternal Grandmother        dx over 5   Prostate cancer Maternal Grandfather    Arthritis Mother    COPD Mother    Breast cancer Mother 40       second at age 76   Hearing loss Mother    Cancer Father        throat   Stroke Father    Cancer Maternal Aunt 49       [name], cervical cancer   Breast cancer Maternal Aunt        dx over 67   Breast cancer Paternal Aunt        dx under 50   Prostate cancer Maternal Uncle        dx over 53   Cancer Other        father's maternal cousin was Henreietta Lacks, Cervical   Cancer Maternal Uncle        unknown cancer   Breast cancer Paternal Aunt        dx over 16   Colon cancer Neg Hx    Allergic rhinitis Neg Hx    Angioedema Neg Hx    Asthma Neg Hx    Atopy Neg Hx    Eczema Neg Hx    Urticaria Neg Hx    Immunodeficiency Neg Hx     Outpatient Encounter  Medications as of 10/09/2023  Medication Sig   metFORMIN  (GLUCOPHAGE -XR) 500 MG 24 hr tablet Take 1 tablet (500 mg total) by mouth daily after breakfast.   acetaminophen  (TYLENOL ) 500 MG tablet Take 500 mg by mouth every 8 (eight) hours as needed for moderate pain (pain score 4-6).   albuterol  (VENTOLIN  HFA) 108 (90 Base) MCG/ACT inhaler Inhale 1 puff into the lungs every 6 (six) hours as needed for wheezing or shortness of breath.   Carboxymethylcell-Glycerin PF (REFRESH RELIEVA PF) 0.5-1 % SOLN Place 1 drop into both eyes in the morning, at noon, in the evening, and at bedtime.   hydrochlorothiazide  (HYDRODIURIL ) 12.5 MG tablet TAKE 1 TABLET(12.5 MG) BY MOUTH DAILY   ibuprofen  (ADVIL ) 100 MG/5ML suspension Take 200 mg by mouth every 4 (four) hours as needed.   latanoprost (XALATAN) 0.005 % ophthalmic solution Place 1 drop into both eyes at  bedtime.   Misc Natural Products (SAMBUCUS ELDERBERRY VITAMIN C MT) Take 1 each by mouth daily. Gummy   Misc. Devices MISC Compression stockings XL: 20-30 mm Hg   Multiple Vitamin (MULTIVITAMIN) capsule Take 1 capsule by mouth daily.   SYNTHROID  100 MCG tablet Take 1 tablet (100 mcg total) by mouth daily before breakfast.   Thiamine  HCl (VITAMIN B-1 PO) Take 1 tablet by mouth in the morning.   No facility-administered encounter medications on file as of 10/09/2023.    ALLERGIES: Allergies  Allergen Reactions   Coconut (Cocos Nucifera) Anaphylaxis   Other Shortness Of Breath    Covid booster pfizer     Prednisone  Hypertension    Severe headache   Iodinated Contrast Media Swelling    Pt reported throat swelling post-procedure that resolved with Benadryl  at home   Petrolatum Dermatitis    Caused incision irritation    Naproxen  Other (See Comments)    headache   VACCINATION STATUS: Immunization History  Administered Date(s) Administered   Fluad Quad(high Dose 65+) 02/11/2020, 01/20/2021, 01/25/2022   Fluad Trivalent(High Dose 65+) 01/22/2023    Influenza, High Dose Seasonal PF 02/08/2018, 02/17/2019   Influenza,inj,Quad PF,6+ Mos 02/28/2017   Influenza-Unspecified 02/28/2017   Moderna Covid-19 Vaccine Bivalent Booster 54yrs & up 02/03/2022   PFIZER(Purple Top)SARS-COV-2 Vaccination 06/06/2019, 06/24/2019, 04/21/2020, 10/07/2020, 02/22/2021   Pneumococcal Conjugate-13 07/05/2016   Pneumococcal Polysaccharide-23 04/29/2019   Rsv, Bivalent, Protein Subunit Rsvpref,pf Pattricia Bores) 03/14/2023   Td 12/23/2018   Unspecified SARS-COV-2 Vaccination 04/30/2023   Zoster Recombinant(Shingrix) 06/06/2021    HPI    Kara [name]  is a patient with the above medical history. she was diagnosed  with hyperthyroidism at approximate age of 33 years  which required I-131 thyroid  ablation in environment.   -She was subsequently initiated on thyroid  hormone supplement.  She is currently on Synthroid  100 mcg p.o. daily before breakfast.     She reports compliance and consistency with her medication.  Her previous thyroid  function test reveals slight over replacement.      She has prediabetes not on medication.  Her point-of-care A1c is higher at 6.4% today.    She has dyslipidemia, not on statins.   -She presents with steady weight since last visit.  She denies tremors, palpitations. Pt denies feeling nodules in neck, hoarseness, dysphagia/odynophagia, SOB with lying down.  she denies family history of  thyroid  disorders.  No family history of thyroid  cancer.  Her father had head and neck tumor which did not appear to be related to thyroid  cancer.  ROS:  Limited as above.   Physical Exam: BP 110/66   Pulse 64   Ht 5\' 6"  (1.676 m)   Wt 226 lb (102.5 kg)   BMI 36.48 kg/m  Wt Readings from Last 3 Encounters:  10/09/23 226 lb (102.5 kg)  10/01/23 226 lb 3.2 oz (102.6 kg)  09/13/23 225 lb 9.6 oz (102.3 kg)    CMP     Component Value Date/Time   NA 140 05/03/2023 0926   K 4.5 05/03/2023 0926   CL 104 05/03/2023 0926   CO2 20  05/03/2023 0926   GLUCOSE 116 (H) 05/03/2023 0926   GLUCOSE 112 (H) 04/25/2022 1500   BUN 16 05/03/2023 0926   CREATININE 0.74 05/03/2023 0926   CREATININE 0.75 03/08/2020 0935   CALCIUM 9.3 05/03/2023 0926   PROT 6.8 05/03/2023 0926   ALBUMIN 3.8 05/03/2023 0926   AST 19 05/03/2023 0926   ALT 17 05/03/2023 0926  ALKPHOS 155 (H) 05/03/2023 0926   BILITOT 0.4 05/03/2023 0926   GFRNONAA 52 (L) 04/25/2022 1500   GFRNONAA 70 10/22/2019 0958   GFRAA 82 10/22/2019 0958    Diabetic Labs (most recent): Lab Results  Component Value Date   HGBA1C 6.4 10/09/2023   HGBA1C 6.2 07/10/2023   HGBA1C 6.2 01/24/2023   MICROALBUR 0.5 08/16/2017   MICROALBUR 0.3 02/19/2017    Lipid Panel     Component Value Date/Time   CHOL 202 (H) 09/13/2023 1040   TRIG 51 09/13/2023 1040   HDL 56 09/13/2023 1040   CHOLHDL 3.6 09/13/2023 1040   CHOLHDL 3.0 10/22/2019 0958   VLDL 15 11/24/2016 1018   LDLCALC 137 (H) 09/13/2023 1040   LDLCALC 114 (H) 10/22/2019 0958   LABVLDL 9 09/13/2023 1040     Lab Results  Component Value Date   TSH 4.900 (H) 09/13/2023   TSH 2.030 07/02/2023   TSH 2.510 06/13/2023   TSH 7.520 (H) 05/03/2023   TSH 0.454 01/16/2023   TSH 0.288 (L) 10/13/2022   TSH 4.420 04/13/2022   TSH 2.720 01/09/2022   TSH 0.980 07/07/2021   TSH 0.62 03/02/2021   FREET4 1.47 09/13/2023   FREET4 1.78 (H) 07/02/2023   FREET4 1.74 06/13/2023   FREET4 1.23 05/03/2023   FREET4 1.80 (H) 01/16/2023   FREET4 1.91 (H) 10/13/2022   FREET4 1.32 04/13/2022   FREET4 1.35 01/09/2022   FREET4 1.52 07/07/2021   FREET4 1.5 03/02/2021       ASSESSMENT: 1. Hypothyroidism- RAI induced 2.  Prediabetes 3. Hyperlipidemia 4. Vitamin d  deficiency   PLAN:   Her previsit thyroid  function tests are consistent with slight under replacement.  Her Synthroid  was recently adjusted at 100 mcg p.o. daily before breakfast.     - We discussed about the correct intake of her thyroid  hormone, on empty  stomach at fasting, with water , separated by at least 30 minutes from breakfast and other medications,  and separated by more than 4 hours from calcium, iron, multivitamins, acid reflux medications (PPIs). -Patient is made aware of the fact that thyroid  hormone replacement is needed for life, dose to be adjusted by periodic monitoring of thyroid  function tests.    -Due to absence of clinical goiter, no need for thyroid  ultrasound. For her progressively worsening prediabetes, she accepts a prescription for metformin .  I discussed and prescribe metformin  500 mg XR p.o. daily after breakfast. In light of her metabolic syndrome indicated by prediabetes, hyperlipidemia, BMI of 36.48 , and the fact that she would like to avoid antilipid medications she still is a good candidate for lifestyle medicine.     - she acknowledges that there is a room for improvement in her food and drink choices. - Suggestion is made for her to avoid simple carbohydrates  from her diet including Cakes, Sweet Desserts, Ice Cream, Soda (diet and regular), Sweet Tea, Candies, Chips, Cookies, Store Bought Juices, Alcohol , Artificial Sweeteners,  Coffee Creamer, and "Sugar-free" Products, Lemonade. This will help patient to have more stable blood glucose profile and potentially avoid unintended weight gain.  The following Lifestyle Medicine recommendations according to American College of Lifestyle Medicine  [facility]) were discussed and and offered to patient and she  agrees to start the journey:  A. Whole Foods, Plant-Based Nutrition comprising of fruits and vegetables, plant-based proteins, whole-grain carbohydrates was discussed in detail with the patient.   A list for source of those nutrients were also provided to the patient.  Patient  will use only water  or unsweetened tea for hydration. B.  The need to stay away from risky substances including alcohol, smoking; obtaining 7 to 9 hours of restorative sleep, at least 150 minutes of  moderate intensity exercise weekly, the importance of healthy social connections,  and stress management techniques were discussed. C.  A full color page of  Calorie density of various food groups per pound showing examples of each food groups was provided to the patient.   This approach will help her with weight management, prediabetes, HPL, HTN.  She is advised to maintain close follow-up with her PCP.     I spent  25  minutes in the care of the patient today including review of labs from Thyroid  Function, CMP, and other relevant labs ; imaging/biopsy records (current and previous including abstractions from other facilities); face-to-face time discussing  her lab results and symptoms, medications doses, her options of short and long term treatment based on the latest standards of care / guidelines;   and documenting the encounter.  Gerard Knight  participated in the discussions, expressed understanding, and voiced agreement with the above plans.  All questions were answered to her satisfaction. she is encouraged to contact clinic should she have any questions or concerns prior to her return visit.    Return in about 4 months (around 02/09/2024) for Fasting Labs  in AM B4 8, A1c -NV.  Kalvin Orf, MD [facility] [address] [place], [place] [address] Phone: [phone]  Fax: [phone]   10/09/2023, 2:15 PM  This note was partially dictated with voice recognition software. Similar sounding words can be transcribed inadequately or may not  be corrected upon review.

## 2023-10-10 ENCOUNTER — Ambulatory Visit: Payer: Medicare Other | Attending: Vascular Surgery | Admitting: Vascular Surgery

## 2023-10-10 ENCOUNTER — Encounter: Payer: Self-pay | Admitting: Vascular Surgery

## 2023-10-10 VITALS — BP 113/70 | HR 79 | Temp 97.8°F | Ht 66.0 in | Wt 224.0 lb

## 2023-10-10 DIAGNOSIS — R600 Localized edema: Secondary | ICD-10-CM | POA: Diagnosis not present

## 2023-10-10 NOTE — Progress Notes (Signed)
 Patient ID: Kara [name], female   DOB: 05/09/1946, 78 y.o.   MRN: [id number]  Reason for Consult: Follow-up   Referred by Luana Rumple, MD  Subjective:     HPI:  Kara [name] is a 78 y.o. female without significant vascular history.  She has been evaluated here in the past for right greater than left lower extremity swelling.  She states that now the swelling is equal in both legs and worse throughout the day.  She does wear lower extremity compression stockings but with minimal improvement in symptoms.  She states that the ankles are actually worse.  She has not had any skin changes or tissue loss.  She has never had any venous intervention.  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  The STAT Breast cancer panel offered by [facility] includes sequencing and rearrangement analysis for the following 9 genes:  ATM, BRCA1, BRCA2, CDH1, CHEK2, PALB2, PTEN, STK11 and TP53.   The report date is August 09, 2019.   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    Perimenopausal vasomotor symptoms 02/11/2016   Personal history of radiation therapy 2021   completed left breast radiation in April 2021   Postmenopausal atrophic vaginitis 11/09/2015   Pre-diabetes    no meds   Prediabetes 08/30/2017   Sleep apnea [id number]   Thyroid  disease    Grave's   Vaginal discharge 01/16/2019   Weight loss, unintentional 03/27/2019   Family History  Problem Relation Age of Onset   Breast cancer Maternal Grandmother        dx over 54   Prostate cancer Maternal Grandfather    Arthritis Mother    COPD Mother    Breast cancer Mother 46       second at age 42   Hearing loss Mother    Cancer Father        throat   Stroke Father    Cancer Maternal Aunt 56       [name], cervical cancer   Breast cancer Maternal Aunt        dx over 35   Breast cancer Paternal Aunt        dx under 50   Prostate cancer Maternal Uncle        dx over 70   Cancer Other        father's maternal cousin was Henreietta Lacks, Cervical   Cancer Maternal Uncle  unknown cancer   Breast cancer Paternal Aunt        dx over 90   Colon cancer Neg Hx    Allergic rhinitis Neg Hx    Angioedema Neg Hx    Asthma Neg Hx    Atopy Neg Hx    Eczema Neg Hx    Urticaria Neg Hx    Immunodeficiency Neg Hx    Past Surgical History:  Procedure Laterality Date   ABDOMINAL HYSTERECTOMY     bleeding. fibroids   BIOPSY  05/02/2022   Procedure: BIOPSY;  Surgeon: Vinetta Greening, DO;  Location: [facility];  Service: Endoscopy;;   BREAST BIOPSY Left 05/2019   left breast DCIS   BREAST LUMPECTOMY Left 06/2019   DCIS   BREAST LUMPECTOMY WITH RADIOACTIVE SEED LOCALIZATION Left 07/08/2019   Procedure: LEFT BREAST LUMPECTOMY WITH RADIOACTIVE SEED LOCALIZATION;  Surgeon: Caralyn Chandler, MD;  Location: [facility];  Service: General;  Laterality: Left;   BREAST SURGERY N/A    [employer]reesia 08/30/2020   CESAREAN SECTION     CESAREAN SECTION N/A    [employer]reesia 08/30/2020    CHOLECYSTECTOMY     COLONOSCOPY  2011   [place] : two 3-4 mm sessile polyps, hyperplastic, sigmoid diverticula, TI appeared normal.    COLONOSCOPY WITH PROPOFOL  N/A 05/02/2022   Procedure: COLONOSCOPY WITH PROPOFOL ;  Surgeon: Vinetta Greening, DO;  Location: [facility];  Service: Endoscopy;  Laterality: N/A;  11:30 am   ESOPHAGOGASTRODUODENOSCOPY  2011   [place] : non-bleeding erosive gastropathy, normal duodenum. path: unremarkable duodenum, negative H.pylori, minimal esophagitis    ESOPHAGOGASTRODUODENOSCOPY N/A 11/23/2022   Procedure: ESOPHAGOGASTRODUODENOSCOPY (EGD);  Surgeon: Normie Becton., MD;  Location: [facility];  Service: Gastroenterology;  Laterality: N/A;   ESOPHAGOGASTRODUODENOSCOPY (EGD) WITH PROPOFOL  N/A 05/02/2022   Procedure: ESOPHAGOGASTRODUODENOSCOPY (EGD) WITH PROPOFOL ;  Surgeon: Vinetta Greening, DO;  Location: [facility];  Service: Endoscopy;  Laterality: N/A;   EUS N/A 11/23/2022   Procedure: UPPER ENDOSCOPIC ULTRASOUND (EUS) RADIAL;  Surgeon: Normie Becton., MD;  Location: [facility];  Service: Gastroenterology;  Laterality: N/A;   EYE SURGERY  2022   IR RADIOLOGIST EVAL & MGMT  06/20/2023   POLYPECTOMY  05/02/2022   Procedure: POLYPECTOMY;  Surgeon: Vinetta Greening, DO;  Location: [facility];  Service: Endoscopy;;   REFRACTIVE SURGERY  2024   TONSILLECTOMY  1975    Short Social History:  Social History   Tobacco Use   Smoking status: Former    Current packs/day: 0.00    Average packs/day: 0.5 packs/day for 15.0 years (7.5 ttl pk-yrs)    Types: Cigarettes    Start date: 06/16/1983    Quit date: 06/15/1998    Years since quitting: 25.3   Smokeless tobacco: Never   Tobacco comments:    quit 2002  Substance Use Topics   Alcohol use: No    Allergies  Allergen Reactions   Coconut (Cocos Nucifera) Anaphylaxis   Other Shortness Of Breath    Covid booster pfizer     Prednisone  Hypertension    Severe headache   Iodinated  Contrast Media Swelling    Pt reported throat swelling post-procedure that resolved with Benadryl  at home   Petrolatum Dermatitis    Caused incision irritation    Naproxen  Other (See Comments)    headache    Current Outpatient Medications  Medication Sig Dispense Refill   acetaminophen  (TYLENOL ) 500 MG tablet Take 500 mg by mouth every 8 (eight) hours as  needed for moderate pain (pain score 4-6).     albuterol  (VENTOLIN  HFA) 108 (90 Base) MCG/ACT inhaler Inhale 1 puff into the lungs every 6 (six) hours as needed for wheezing or shortness of breath. 6.7 g 2   Carboxymethylcell-Glycerin PF (REFRESH RELIEVA PF) 0.5-1 % SOLN Place 1 drop into both eyes in the morning, at noon, in the evening, and at bedtime.     hydrochlorothiazide  (HYDRODIURIL ) 12.5 MG tablet TAKE 1 TABLET(12.5 MG) BY MOUTH DAILY 30 tablet 0   ibuprofen  (ADVIL ) 100 MG/5ML suspension Take 200 mg by mouth every 4 (four) hours as needed.     latanoprost (XALATAN) 0.005 % ophthalmic solution Place 1 drop into both eyes at bedtime.     metFORMIN  (GLUCOPHAGE -XR) 500 MG 24 hr tablet Take 1 tablet (500 mg total) by mouth daily after breakfast. 90 tablet 1   Misc Natural Products (SAMBUCUS ELDERBERRY VITAMIN C MT) Take 1 each by mouth daily. Gummy     Misc. Devices MISC Compression stockings XL: 20-30 mm Hg 1 each 0   Multiple Vitamin (MULTIVITAMIN) capsule Take 1 capsule by mouth daily.     SYNTHROID  100 MCG tablet Take 1 tablet (100 mcg total) by mouth daily before breakfast. 90 tablet 0   Thiamine  HCl (VITAMIN B-1 PO) Take 1 tablet by mouth in the morning.     No current facility-administered medications for this visit.    Review of Systems  Constitutional:  Constitutional negative. Eyes: Eyes negative.  Respiratory: Respiratory negative.  Cardiovascular: Positive for leg swelling.  GI: Gastrointestinal negative.  Musculoskeletal: Positive for leg pain.  Neurological: Neurological negative. Hematologic: Hematologic/lymphatic  negative.  Psychiatric: Psychiatric negative.        Objective:  Objective   Vitals:   10/10/23 1033  BP: 113/70  Pulse: 79  Temp: 97.8 F (36.6 C)  SpO2: 97%  Weight: 224 lb (101.6 kg)  Height: 5\' 6"  (1.676 m)   Body mass index is 36.15 kg/m.  Physical Exam HENT:     Head: Normocephalic.     Nose: Nose normal.  Eyes:     Pupils: Pupils are equal, round, and reactive to light.  Cardiovascular:     Rate and Rhythm: Normal rate.  Pulmonary:     Effort: Pulmonary effort is normal.  Abdominal:     General: Abdomen is flat.  Musculoskeletal:     Cervical back: Normal range of motion and neck supple.     Right lower leg: Edema present.     Left lower leg: Edema present.     Comments: Negative Stemmer sign bilaterally  Skin:    Capillary Refill: Capillary refill takes less than 2 seconds.  Neurological:     General: No focal deficit present.     Mental Status: She is alert.  Psychiatric:        Mood and Affect: Mood normal.        Thought Content: Thought content normal.        Judgment: Judgment normal.     Data: +--------------+---------+------+-----------+------------+-------------+  LEFT         Reflux NoRefluxReflux TimeDiameter cmsComments                               Yes                                        +--------------+---------+------+-----------+------------+-------------+  CFV          no                                                   +--------------+---------+------+-----------+------------+-------------+  FV mid        no                                                   +--------------+---------+------+-----------+------------+-------------+  Popliteal    no                                                   +--------------+---------+------+-----------+------------+-------------+  GSV at [facility]    no                            0.57                    +--------------+---------+------+-----------+------------+-------------+  GSV prox thighno                            0.47                   +--------------+---------+------+-----------+------------+-------------+  GSV mid thigh no                            0.34                   +--------------+---------+------+-----------+------------+-------------+  GSV dist thighno                            0.23    out of fascia  +--------------+---------+------+-----------+------------+-------------+  GSV at knee   no                            0.24    out of fascia  +--------------+---------+------+-----------+------------+-------------+  GSV prox calf                                       NWV            +--------------+---------+------+-----------+------------+-------------+  SSV Pop Fossa no                            0.23                   +--------------+---------+------+-----------+------------+-------------+         Summary:  Left:  - No evidence of deep vein thrombosis from the common femoral through the  popliteal veins.  - No evidence of superficial venous thrombosis.  - The deep venous system is competent.  - The great and small saphenous veins are competent.         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  We discussed the results of both of her lower extremity reflux test which do not demonstrate any large refluxing veins for intervention.  I have discussed that she will be best controlled with symptom management.  Unfortunately she cannot tolerate diuretics due to low blood pressure.  We have discussed the need for continued compression stockings, continued exercise to continue to pump the fluid back to her heart and would benefit from water  aerobics.  We discussed the need for weight loss and elevation of her legs when she is recumbent.  She demonstrates good  understanding of our conversation and can see me on an as-needed basis.    Adine Hoof MD [facility]

## 2023-10-16 ENCOUNTER — Encounter (HOSPITAL_COMMUNITY): Payer: Self-pay

## 2023-10-16 ENCOUNTER — Emergency Department (HOSPITAL_COMMUNITY)

## 2023-10-16 ENCOUNTER — Other Ambulatory Visit: Payer: Self-pay

## 2023-10-16 ENCOUNTER — Emergency Department (HOSPITAL_COMMUNITY)
Admission: EM | Admit: 2023-10-16 | Discharge: 2023-10-16 | Disposition: A | Discharge status: Home / Self Care | Attending: Emergency Medicine | Admitting: Emergency Medicine

## 2023-10-16 ENCOUNTER — Ambulatory Visit: Payer: Self-pay | Admitting: *Deleted

## 2023-10-16 DIAGNOSIS — Z853 Personal history of malignant neoplasm of breast: Secondary | ICD-10-CM | POA: Insufficient documentation

## 2023-10-16 DIAGNOSIS — M7989 Other specified soft tissue disorders: Secondary | ICD-10-CM | POA: Insufficient documentation

## 2023-10-16 DIAGNOSIS — I509 Heart failure, unspecified: Secondary | ICD-10-CM | POA: Diagnosis not present

## 2023-10-16 DIAGNOSIS — R079 Chest pain, unspecified: Secondary | ICD-10-CM

## 2023-10-16 DIAGNOSIS — R0789 Other chest pain: Secondary | ICD-10-CM | POA: Diagnosis not present

## 2023-10-16 DIAGNOSIS — R0602 Shortness of breath: Secondary | ICD-10-CM | POA: Diagnosis not present

## 2023-10-16 LAB — CBC WITH DIFFERENTIAL/PLATELET
Abs Immature Granulocytes: 0.03 10*3/uL (ref 0.00–0.07)
Basophils Absolute: 0.1 10*3/uL (ref 0.0–0.1)
Basophils Relative: 1 %
Eosinophils Absolute: 0.2 10*3/uL (ref 0.0–0.5)
Eosinophils Relative: 2 %
HCT: 43.7 % (ref 36.0–46.0)
Hemoglobin: 14 g/dL (ref 12.0–15.0)
Immature Granulocytes: 0 %
Lymphocytes Relative: 34 %
Lymphs Abs: 2.3 10*3/uL (ref 0.7–4.0)
MCH: 26.2 pg (ref 26.0–34.0)
MCHC: 32 g/dL (ref 30.0–36.0)
MCV: 81.7 fL (ref 80.0–100.0)
Monocytes Absolute: 0.6 10*3/uL (ref 0.1–1.0)
Monocytes Relative: 10 %
Neutro Abs: 3.5 10*3/uL (ref 1.7–7.7)
Neutrophils Relative %: 53 %
Platelets: 192 10*3/uL (ref 150–400)
RBC: 5.35 MIL/uL — ABNORMAL HIGH (ref 3.87–5.11)
RDW: 17.8 % — ABNORMAL HIGH (ref 11.5–15.5)
WBC: 6.8 10*3/uL (ref 4.0–10.5)
nRBC: 0 % (ref 0.0–0.2)

## 2023-10-16 LAB — BRAIN NATRIURETIC PEPTIDE: B Natriuretic Peptide: 12 pg/mL (ref 0.0–100.0)

## 2023-10-16 LAB — COMPREHENSIVE METABOLIC PANEL WITH GFR
ALT: 15 U/L (ref 0–44)
AST: 18 U/L (ref 15–41)
Albumin: 3.6 g/dL (ref 3.5–5.0)
Alkaline Phosphatase: 92 U/L (ref 38–126)
Anion gap: 8 (ref 5–15)
BUN: 21 mg/dL (ref 8–23)
CO2: 23 mmol/L (ref 22–32)
Calcium: 9.5 mg/dL (ref 8.9–10.3)
Chloride: 102 mmol/L (ref 98–111)
Creatinine, Ser: 1.03 mg/dL — ABNORMAL HIGH (ref 0.44–1.00)
GFR, Estimated: 56 mL/min — ABNORMAL LOW (ref >60)
Glucose, Bld: 104 mg/dL — ABNORMAL HIGH (ref 70–99)
Potassium: 4.1 mmol/L (ref 3.5–5.1)
Sodium: 133 mmol/L — ABNORMAL LOW (ref 135–145)
Total Bilirubin: 0.6 mg/dL (ref 0.0–1.2)
Total Protein: 7.4 g/dL (ref 6.5–8.1)

## 2023-10-16 LAB — TROPONIN I (HIGH SENSITIVITY)
Troponin I (High Sensitivity): 3 ng/L (ref <18)
Troponin I (High Sensitivity): 3 ng/L (ref <18)

## 2023-10-16 MED ORDER — PANTOPRAZOLE SODIUM 40 MG PO TBEC: 40.0000 mg | DELAYED_RELEASE_TABLET | Freq: Every day | ORAL | 0 refills | Status: DC

## 2023-10-16 NOTE — Discharge Instructions (Signed)
 As discussed, I recommend that you stop the hydrochlorothiazide .  I recommend you elevate your legs is much as possible and wear your compression stockings.  I have given you a prescription for medication for acid reflux that may help with your symptoms.  Someone from your cardiologist office should contact you to arrange follow-up appointment.  Return to the emergency department for any new or worsening symptoms.

## 2023-10-16 NOTE — Telephone Encounter (Signed)
  Chief Complaint: chest pain like squeezing comes and goes Symptoms: chest pain from left side to right under breasts. Squeezing since Sunday . Bilateral legs were swollen and patient took diuretic and lost 6 lbs since Sunday. After taking metformin  and diuretic felt dizziness and like passing out.  Frequency: since Sunday  Pertinent Negatives: Patient denies chest pain crushing like or severe no difficulty breathing reported now .  Disposition: [x] ED /[] Urgent Care (no appt availability in office) / [] Appointment(In office/virtual)/ []  [facility]/ [] Home Care/ [] Refused Recommended Disposition /[]  Mobile Bus/ []  Follow-up with PCP Additional Notes:   Recommended patient to go to ED now. Patient requesting PCP to see her due to her taking medication and pain comes and goes. Please advise.  [facility] notified of unknown dispostion    Copied from CRM [id number]. Topic: Clinical - Red Word Triage >> Oct 16, 2023 12:47 PM Stanly Early wrote: Red Word that prompted transfer to Nurse Triage: chest pain, hasn't been feeling well Reason for Disposition  [1] Chest pain (or "angina") comes and goes AND [2] is happening more often (increasing in frequency) or getting worse (increasing in severity)  (Exception: Chest pains that last only a few seconds.)  Answer Assessment - Initial Assessment Questions 1. LOCATION: "Where does it hurt?"       Chest under breasts feels like squeezing  2. RADIATION: "Does the pain go anywhere else?" (e.g., into neck, jaw, arms, back)     Na  3. ONSET: "When did the chest pain begin?" (Minutes, hours or days)      Sunday  4. PATTERN: "Does the pain come and go, or has it been constant since it started?"  "Does it get worse with exertion?"      Comes and goes  5. DURATION: "How long does it last" (e.g., seconds, minutes, hours)     All the time  6. SEVERITY: "How bad is the pain?"  (e.g., Scale 1-10; mild, moderate, or severe)    - MILD (1-3): doesn't  interfere with normal activities     - MODERATE (4-7): interferes with normal activities or awakens from sleep    - SEVERE (8-10): excruciating pain, unable to do any normal activities       Na  7. CARDIAC RISK FACTORS: "Do you have any history of heart problems or risk factors for heart disease?" (e.g., angina, prior heart attack; diabetes, high blood pressure, high cholesterol, smoker, or strong family history of heart disease)     See hx  8. PULMONARY RISK FACTORS: "Do you have any history of lung disease?"  (e.g., blood clots in lung, asthma, emphysema, birth control pills)     na 9. CAUSE: "What do you think is causing the chest pain?"     Not sure legs were swollen and took diuretic and lost 6 lbs of fluid 10. OTHER SYMPTOMS: "Do you have any other symptoms?" (e.g., dizziness, nausea, vomiting, sweating, fever, difficulty breathing, cough)       Dizziness Sunday and felt like passing out but did not. No dizziness reported now chest pain like squeezing  11. PREGNANCY: "Is there any chance you are pregnant?" "When was your last menstrual period?"       na  Protocols used: Chest Pain-A-AH

## 2023-10-16 NOTE — ED Triage Notes (Signed)
 Pt arrived via POV c/o righ to left chest pain, endorsing mild SOB. Pt reports recent edema from not taking her hydrochlorothiazide  as prescribed. Pt reports recently being started on Metformin  and reports she started taking her hydrochlorothiazide  again with her Metformin  and this is when her Chest pain began.

## 2023-10-16 NOTE — Telephone Encounter (Signed)
 Noted, patient advised to go to ED

## 2023-10-16 NOTE — ED Provider Triage Note (Signed)
 Emergency Medicine Provider Triage Evaluation Note  ZEENAT JEANBAPTISTE , a 78 y.o. female  was evaluated in triage.  Pt complains of diffuse lower chest pain since yesterday.  Describes aching pain underneath both breast.  Pains been associated with some mild exertional shortness of breath.  She endorses recurrent swelling to her lower legs.  She takes HCTZ as needed for this.  She has also been started on metformin  for diabetes.  She last took hydrochlorothiazide  on Sunday and endorses increased urination.  She took metformin  and hydrochlorothiazide  together on Sunday states this is when her chest pain began.  She believes current symptoms may be related to taking these medications together.  She took her hydrochlorothiazide  again today.  She denies any cough, fever or chills, abdominal pain, nausea or vomiting or pain to her upper chest.  Review of Systems  Positive: Chest pain, shortness of breath with exertion peripheral edema Negative: Cough, fever, arm neck or jaw pain  Physical Exam  BP (!) 131/54 (BP Location: Right Arm)   Pulse 81   Temp (!) 97.3 F (36.3 C) (Temporal)   Resp 16   Ht 5\' 6"  (1.676 m)   Wt 101.6 kg   SpO2 99%   BMI 36.15 kg/m  Gen:   Awake, no distress   Resp:  Normal effort  MSK:   Moves extremities without difficulty  Other:    Medical Decision Making  Medically screening exam initiated at 1:58 PM.  Appropriate orders placed.  Faustine G Schobert was informed that the remainder of the evaluation will be completed by another provider, this initial triage assessment does not replace that evaluation, and the importance of remaining in the ED until their evaluation is complete.     Catherne Clubs, PA-C 10/16/23 1401

## 2023-10-16 NOTE — ED Provider Notes (Signed)
 [place] EMERGENCY DEPARTMENT AT [facility] Provider Note   CSN: [id number] Arrival date & time: 10/16/23  1330     History  Chief Complaint  Patient presents with   Chest Pain    Kara [name] is a 78 y.o. female.   Chest Pain Associated symptoms: shortness of breath   Associated symptoms: no abdominal pain, no back pain, no cough, no fever, no headache, no nausea, no numbness, no vomiting and no weakness        Kara [name] is a 78 y.o. female past medical history of GERD, prediabetes, anemia, breast cancer, thyroid  disease, who presents to the Emergency Department.  Pt complains of diffuse lower chest pain since yesterday.  Describes aching pain underneath both breast.  Pains been associated with some mild exertional shortness of breath.  She endorses recurrent swelling to her lower legs.  She takes HCTZ as needed for this.  She has also been started on metformin  for elevated A1c.  She last took hydrochlorothiazide  on Sunday and endorses increased urination.  She took metformin  and hydrochlorothiazide  together on Sunday and this is when her chest pain began.  She believes current symptoms may be related to taking these medications together.  She took her hydrochlorothiazide  again today.  She denies any cough, fever or chills, abdominal pain, nausea or vomiting or pain to her upper chest.   Home Medications Prior to Admission medications   Medication Sig Start Date End Date Taking? Authorizing Provider  acetaminophen  (TYLENOL ) 500 MG tablet Take 500 mg by mouth every 8 (eight) hours as needed for moderate pain (pain score 4-6).    [provider]  albuterol  (VENTOLIN  HFA) 108 (90 Base) MCG/ACT inhaler Inhale 1 puff into the lungs every 6 (six) hours as needed for wheezing or shortness of breath. 01/03/23   Meldon Sport, MD  Carboxymethylcell-Glycerin PF (REFRESH RELIEVA PF) 0.5-1 % SOLN Place 1 drop into both eyes in the morning, at noon, in the  evening, and at bedtime.    [provider]  hydrochlorothiazide  (HYDRODIURIL ) 12.5 MG tablet TAKE 1 TABLET(12.5 MG) BY MOUTH DAILY 07/10/23   Meldon Sport, MD  ibuprofen  (ADVIL ) 100 MG/5ML suspension Take 200 mg by mouth every 4 (four) hours as needed.    [provider]  latanoprost (XALATAN) 0.005 % ophthalmic solution Place 1 drop into both eyes at bedtime. 06/06/22   [provider]  metFORMIN  (GLUCOPHAGE -XR) 500 MG 24 hr tablet Take 1 tablet (500 mg total) by mouth daily after breakfast. 10/09/23   Nida, Gebreselassie W, MD  Misc Natural Products (SAMBUCUS ELDERBERRY VITAMIN C MT) Take 1 each by mouth daily. Gummy    [provider]  Misc. Devices MISC Compression stockings XL: 20-30 mm Hg 01/22/23   Meldon Sport, MD  Multiple Vitamin (MULTIVITAMIN) capsule Take 1 capsule by mouth daily.    [provider]  SYNTHROID  100 MCG tablet Take 1 tablet (100 mcg total) by mouth daily before breakfast. 09/28/23   Nida, Gebreselassie W, MD  Thiamine  HCl (VITAMIN B-1 PO) Take 1 tablet by mouth in the morning.    [provider]      Allergies    Coconut (cocos nucifera), Other, Prednisone , Iodinated contrast media, and Petrolatum    Review of Systems   Review of Systems  Constitutional:  Negative for appetite change, chills and fever.  Respiratory:  Positive for shortness of breath. Negative for cough.   Cardiovascular:  Positive for chest pain  and leg swelling.  Gastrointestinal:  Negative for abdominal pain, diarrhea, nausea and vomiting.  Genitourinary:  Negative for dysuria.  Musculoskeletal:  Negative for back pain and myalgias.  Neurological:  Negative for syncope, weakness, numbness and headaches.    Physical Exam Updated Vital Signs BP 131/80   Pulse 74   Temp 98.4 F (36.9 C) (Oral)   Resp 11   Ht 5\' 6"  (1.676 m)   Wt 101.6 kg   SpO2 95%   BMI 36.15 kg/m  Physical Exam Vitals and nursing note reviewed.  Constitutional:       General: She is not in acute distress.    Appearance: Normal appearance. She is not ill-appearing.  Cardiovascular:     Rate and Rhythm: Normal rate and regular rhythm.     Pulses: Normal pulses.  Pulmonary:     Effort: Pulmonary effort is normal. No respiratory distress.     Breath sounds: No wheezing.  Chest:     Chest wall: No tenderness.  Abdominal:     Palpations: Abdomen is soft.     Tenderness: There is no abdominal tenderness.  Musculoskeletal:        General: Normal range of motion.     Cervical back: Normal range of motion.     Right lower leg: No edema.     Left lower leg: No edema.  Skin:    General: Skin is warm.     Capillary Refill: Capillary refill takes less than 2 seconds.  Neurological:     General: No focal deficit present.     Mental Status: She is alert.     Sensory: No sensory deficit.     Motor: No weakness.     ED Results / Procedures / Treatments   Labs (all labs ordered are listed, but only abnormal results are displayed) Labs Reviewed  CBC WITH DIFFERENTIAL/PLATELET - Abnormal; Notable for the following components:      Result Value   RBC 5.35 (*)    RDW 17.8 (*)    All other components within normal limits  COMPREHENSIVE METABOLIC PANEL WITH GFR - Abnormal; Notable for the following components:   Sodium 133 (*)    Glucose, Bld 104 (*)    Creatinine, Ser 1.03 (*)    GFR, Estimated 56 (*)    All other components within normal limits  BRAIN NATRIURETIC PEPTIDE  TROPONIN I (HIGH SENSITIVITY)  TROPONIN I (HIGH SENSITIVITY)    EKG EKG Interpretation Date/Time:  Tuesday October 16 2023 14:37:30 EDT Ventricular Rate:  76 PR Interval:  128 QRS Duration:  69 QT Interval:  368 QTC Calculation: 414 R Axis:   41  Text Interpretation: Sinus rhythm Confirmed by Iva Mariner [phone]) on 10/16/2023 4:50:38 PM  Radiology DG Chest Port 1 View Result Date: 10/16/2023 CLINICAL DATA:  Chest pain.  Mild shortness of breath. EXAM: PORTABLE CHEST 1 VIEW  COMPARISON:  08/04/2017 FINDINGS: Both lungs are clear. Negative for a pneumothorax. Heart and mediastinum are within normal limits. Trachea is midline. No acute bone abnormality. IMPRESSION: No active disease. Electronically Signed   By: Elene Griffes M.D.   On: 10/16/2023 16:23    Procedures Procedures    Medications Ordered in ED Medications - No data to display  ED Course/ Medical Decision Making/ A&P                                 Medical Decision Making Patient  here with pain underneath both breast some mild waxing and waning shortness of breath.  Symptoms began after taking metformin  and hydrochlorothiazide  together few days ago.  States that she takes HCTZ as needed when she feels that she is having increased edema.  Wears compression stockings with minimal relief.  No neck arm or jaw pain.  No nausea or vomiting.  States her cardiologist has advised her to stop the HCTZ as it typically lowers her blood pressure.  She cannot tolerate Lasix .  On exam, patient well-appearing.  Vital signs reassuring.  No tachycardia tachypnea or hypoxia.  No back pain.  I do not appreciate any pitting edema of her extremities.  Her lung sounds are clear to auscultation bilaterally.  No increased work of breathing  On review of medical records, patient had reassuring duplex scans of bilateral lower extremities in December 2024, she is followed by cardiology, Dr. Alvis Ba.  She had reassuring cardiac CT in January.   CHF, peripheral edema, medication intolerance, GERD, ACS all considered. Low clinical suspicion for PE  Amount and/or Complexity of Data Reviewed Labs: ordered.    Details: Labs interpreted by me, no evidence of leukocytosis, hemoglobin unremarkable.  Chemistries without significant derangement.  BNP is reassuring at 12.  Her delta troponin unremarkable Radiology: ordered.    Details: Chest x-ray without acute cardiopulmonary process ECG/medicine tests: ordered.    Details: EKG shows  sinus rhythm Discussion of management or test interpretation with external provider(s): On recheck, patient resting comfortably here, no pitting edema of the bilateral lower extremities, her lung sounds are clear to auscultation.  Her workup today is essentially reassuring.  No findings suggestive of CHF.  On review of medical records, her blood pressures are typically on the lower side.  I will have her discontinue the HCTZ.  Patient reports intolerance of Lasix .  I have recommended compression stockings and extremity elevation.  Will have her follow-up closely outpatient with her cardiologist  Risk Prescription drug management.           Final Clinical Impression(s) / ED Diagnoses Final diagnoses:  Nonspecific chest pain    Rx / DC Orders ED Discharge Orders     None         Catherne Clubs, PA-C 10/19/23 2311    Iva Mariner, MD 10/21/23 [phone]

## 2023-10-17 ENCOUNTER — Encounter: Payer: Self-pay | Admitting: Orthopedic Surgery

## 2023-10-22 ENCOUNTER — Encounter: Payer: Self-pay | Admitting: Cardiovascular Disease

## 2023-10-22 ENCOUNTER — Telehealth: Payer: Self-pay | Admitting: Cardiovascular Disease

## 2023-10-22 NOTE — Telephone Encounter (Signed)
   Pt c/o of Chest Pain: STAT if active CP, including tightness, pressure, jaw pain, radiating pain to shoulder/upper arm/back, CP unrelieved by Nitro. Symptoms reported of SOB, nausea, vomiting, sweating.  1. Are you having CP right now? no    2. Are you experiencing any other symptoms (ex. SOB, obnausea, vomiting, sweating)? Sob, swelling   3. Is your CP continuous or coming and going? continuous   4. Have you taken Nitroglycerin? no   5. How long have you been experiencing CP? Went to Er 6/3  Pt was taking off metFORMIN  (GLUCOPHAGE -XR) 500 MG 24 hr tablet and hydrochlorothiazide  (HYDRODIURIL ) 12.5 MG tablet    6. If NO CP at time of call then end call with telling Pt to call back or call 911 if Chest pain returns prior to return call from triage team.

## 2023-10-22 NOTE — Telephone Encounter (Signed)
 Returned patient's call regarding c/o atypical chest pain. Patient with recent ED visit for chest pain 10/16/23, troponins normal and EKG showing sinus rhythm. BNP 12. Hydrochlorothiazide  and metformin  were discontinued due to Cr of 1.03 and patient's report that she had taken these meds together and she believed this had coincided with onset of chest pain. She was instructed to f/u with her outpatient cardiologist by PA/MD who saw her in ED.    Patient stated she went home and had no chest pain until today. She initially reported continuous chest pain but on further assessment she described it as "like indigestion" and then "it's just continuous chest pressure". At this time she reported she was concerned her legs were getting more swollen since she was off her hydrochlorothiazide . She reports she has chronic leg swelling but has difficulty tolerating diuretics such as lasix . She reports a 1 lb weight gain in 6 days and states she feels a little short of breath on exertion. She reports elevating her legs and wearing her compression stockings. She states she is able to get her shoes and socks on at this time.  Discussed with DOD, who reviewed chart. Coronary calcium score in 05/2023 was 0, EF was 60-65% in 2021. DOD does not recommend any new tx due to history of lasix  intolerance (due to hypotension) and to f/u with Dr. Alvis Ba. Dr. Alvis Ba will not be in office for 3 weeks, no APP visits available during that time. Scheduled patient with DOD 10/25/23. Advised of ED precautions, patient verbalizes understanding to go to ER if chest pain/SOB worsen.

## 2023-10-23 NOTE — Telephone Encounter (Signed)
 Agree with plan

## 2023-10-24 ENCOUNTER — Ambulatory Visit: Admitting: Internal Medicine

## 2023-10-25 ENCOUNTER — Ambulatory Visit: Attending: Cardiology | Admitting: Cardiology

## 2023-10-25 ENCOUNTER — Encounter: Payer: Self-pay | Admitting: Cardiology

## 2023-10-25 VITALS — BP 106/66 | HR 95 | Resp 16 | Ht 66.0 in | Wt 230.6 lb

## 2023-10-25 DIAGNOSIS — H401221 Low-tension glaucoma, left eye, mild stage: Secondary | ICD-10-CM | POA: Diagnosis not present

## 2023-10-25 DIAGNOSIS — M7989 Other specified soft tissue disorders: Secondary | ICD-10-CM | POA: Insufficient documentation

## 2023-10-25 DIAGNOSIS — H40021 Open angle with borderline findings, high risk, right eye: Secondary | ICD-10-CM | POA: Diagnosis not present

## 2023-10-25 DIAGNOSIS — R072 Precordial pain: Secondary | ICD-10-CM

## 2023-10-25 DIAGNOSIS — E782 Mixed hyperlipidemia: Secondary | ICD-10-CM | POA: Diagnosis not present

## 2023-10-25 DIAGNOSIS — G4733 Obstructive sleep apnea (adult) (pediatric): Secondary | ICD-10-CM | POA: Diagnosis not present

## 2023-10-25 NOTE — Patient Instructions (Signed)
 Medication Instructions:  No medication changes were made at this visit. Continue current regimen.   *If you need a refill on your cardiac medications before your next appointment, please call your pharmacy*  Lab Work: None ordered today. If you have labs (blood work) drawn today and your tests are completely normal, you will receive your results only by: MyChart Message (if you have MyChart) OR A paper copy in the mail If you have any lab test that is abnormal or we need to change your treatment, we will call you to review the results.  Testing/Procedures: Your physician has requested that you have an echocardiogram. Echocardiography is a painless test that uses sound waves to create images of your heart. It provides your doctor with information about the size and shape of your heart and how well your heart's chambers and valves are working. This procedure takes approximately one hour. There are no restrictions for this procedure. Please do NOT wear cologne, perfume, aftershave, or lotions (deodorant is allowed). Please arrive 15 minutes prior to your appointment time.  Please note: We ask at that you not bring children with you during ultrasound (echo/ vascular) testing. Due to room size and safety concerns, children are not allowed in the ultrasound rooms during exams. Our front office staff cannot provide observation of children in our lobby area while testing is being conducted. An adult accompanying a patient to their appointment will only be allowed in the ultrasound room at the discretion of the ultrasound technician under special circumstances. We apologize for any inconvenience.   Follow-Up: At [facility], you and your health needs are our priority.  As part of our continuing mission to provide you with exceptional heart care, our providers are all part of one team.  This team includes your primary Cardiologist (physician) and Advanced Practice Providers or APPs (Physician  Assistants and Nurse Practitioners) who all work together to provide you with the care you need, when you need it.  Your next appointment:   3 month(s)  Provider:   Luana Rumple, MD

## 2023-10-25 NOTE — Progress Notes (Signed)
 Cardiology Office Note:    ID:  MAURITA HAVENER, DOB 1946-01-14, MRN [id number]  PCP:  Meldon Sport, MD   [facility] Providers Cardiologist:  Luana Rumple, MD     Referring MD: Meldon Sport, MD   Chief Complaint  Patient presents with   Leg Swelling   Chest Pain   Follow-up    History of Present Illness:    HAELYN FORGEY is a 78 y.o. female with a hx of hyperlipidemia, prediabetes, peripheral venous insufficiency, treated hypothyroidism, left breast cancer status post surgery and external beam radiation (completed April 2021), GERD, anxiety, who has noticed occasional palpitations and has problems with bilateral lower extremity edema.  Patient follows up with Dr. Alvis Ba and last office visit was on 04/09/2023. Patient comes in today for a sick visit w/ the DOD.   Patient has a long history of bilateral lower extremity swelling as well as lymphedema.  She does wear bilateral compression stockings which do help her symptoms.  She has had a vascular ultrasound which has noted some reflux and was referred to vascular and vein to see if she is a candidate for any invasive procedures i.e. ablation.  Conservative management is recommended and last office visit with Dr. Vikki Graves was on Oct 10, 2023, progress note reviewed.  Patient presents today for evaluation of precordial pain. Patient states that she was recently started on metformin  and her last dose was October 14, 2023. However, on Monday, October 15, 2023 she was experiencing chest tightness like a belts squeezing underneath her bilateral breasts. Unable to comment on exertional symptoms as she was not active during these episodes. At time of symptom onset she was at rest cutting vegetables. Intensity was 7 out of 10. Lasted chronically for 3 days. She did go to the ED on June 3,.  EKG was nonischemic, high sensitive troponin negative x 2, BNP within normal limits, recommended outpatient follow-up.  Currently she is  chest pain-free.  She had a coronary calcium score since last office visit with her primary cardiologist and her total coronary calcium score was 0.    No exercise last month due to right knee and walks w/ cane.    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  The STAT Breast cancer panel offered by [facility] includes sequencing and rearrangement analysis for the following 9 genes:  ATM, BRCA1, BRCA2, CDH1, CHEK2, PALB2, PTEN, STK11 and TP53.   The report date is August 09, 2019.   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 fibroids   BIOPSY  05/02/2022   Procedure: BIOPSY;  Surgeon: Vinetta Greening, DO;  [facility];  Service: Endoscopy;;   BREAST BIOPSY Left 05/2019   left breast DCIS   BREAST LUMPECTOMY Left 06/2019   DCIS   BREAST LUMPECTOMY WITH RADIOACTIVE SEED LOCALIZATION Left 07/08/2019   Procedure: LEFT BREAST LUMPECTOMY WITH RADIOACTIVE SEED LOCALIZATION;  Surgeon: Caralyn Chandler, MD;  Location: [facility];  Service: General;  Laterality: Left;   BREAST SURGERY N/A    Phreesia 08/30/2020   CESAREAN SECTION     CESAREAN SECTION N/A    Phreesia 08/30/2020   CHOLECYSTECTOMY     COLONOSCOPY  2011   [place] : two 3-4 mm sessile polyps, hyperplastic, sigmoid diverticula, TI appeared normal.    COLONOSCOPY WITH PROPOFOL  N/A 05/02/2022   Procedure: COLONOSCOPY WITH PROPOFOL ;  Surgeon: Vinetta Greening, DO;  [facility];  Service: Endoscopy;  Laterality: N/A;  11:30 am   ESOPHAGOGASTRODUODENOSCOPY  2011   [place] : non-bleeding erosive gastropathy, normal duodenum. path: unremarkable duodenum, negative H.pylori, minimal esophagitis    ESOPHAGOGASTRODUODENOSCOPY N/A 11/23/2022   Procedure: ESOPHAGOGASTRODUODENOSCOPY (EGD);  Surgeon: Normie Becton., MD;  Location: [facility];  Service: Gastroenterology;  Laterality: N/A;   ESOPHAGOGASTRODUODENOSCOPY (EGD) WITH PROPOFOL  N/A 05/02/2022   Procedure: ESOPHAGOGASTRODUODENOSCOPY (EGD) WITH PROPOFOL ;  Surgeon: Vinetta Greening, DO;  [facility];  Service: Endoscopy;  Laterality: N/A;   EUS N/A 11/23/2022   Procedure: UPPER ENDOSCOPIC ULTRASOUND (EUS) RADIAL;  Surgeon: Normie Becton., MD;  Location: [facility];  Service: Gastroenterology;  Laterality: N/A;   EYE SURGERY  2022   IR RADIOLOGIST EVAL & MGMT  06/20/2023   POLYPECTOMY  05/02/2022   Procedure: POLYPECTOMY;  Surgeon: Vinetta Greening, DO;  [facility];  Service: Endoscopy;;   REFRACTIVE SURGERY  2024    TONSILLECTOMY  1975    Current Medications: Current Meds  Medication Sig   acetaminophen  (TYLENOL ) 500 MG tablet Take 500 mg by mouth every 8 (eight) hours as needed for moderate pain (pain score 4-6).   albuterol  (VENTOLIN  HFA) 108 (90 Base) MCG/ACT inhaler Inhale 1 puff into the lungs every 6 (six) hours as needed for wheezing or shortness of breath.   Carboxymethylcell-Glycerin PF (REFRESH RELIEVA PF) 0.5-1 % SOLN Place 1 drop into both eyes in the morning, at noon, in the evening, and at bedtime.   ibuprofen  (ADVIL ) 100 MG/5ML suspension Take 200 mg by mouth every 4 (four) hours as needed.   latanoprost (XALATAN) 0.005 % ophthalmic solution Place 1 drop into both eyes at bedtime.   metFORMIN  (GLUCOPHAGE -XR) 500 MG 24 hr tablet Take 1 tablet (500 mg total) by mouth daily after breakfast.   Misc Natural Products (SAMBUCUS ELDERBERRY VITAMIN C MT) Take 1 each by mouth daily. Gummy   Misc. Devices MISC Compression stockings XL: 20-30 mm Hg  Multiple Vitamin (MULTIVITAMIN) capsule Take 1 capsule by mouth daily.   pantoprazole  (PROTONIX ) 40 MG tablet Take 1 tablet (40 mg total) by mouth daily.   SYNTHROID  100 MCG tablet Take 1 tablet (100 mcg total) by mouth daily before breakfast.   Thiamine  HCl (VITAMIN B-1 PO) Take 1 tablet by mouth in the morning.     Allergies:   Coconut (cocos nucifera), Other, Prednisone , Iodinated contrast media, and Petrolatum   Social History   Socioeconomic History   Marital status: Divorced    Spouse name: Not on file   Number of children: 2   Years of education: 15   Highest education level: Some college, no degree  Occupational History   Occupation: retired    Comment: Loss adjuster, chartered at a [employer]  Tobacco Use   Smoking status: Former    Current packs/day: 0.00    Average packs/day: 0.5 packs/day for 15.0 years (7.5 ttl pk-yrs)    Types: Cigarettes    Start date: 06/16/1983    Quit date: 06/15/1998    Years since quitting: 25.3    Smokeless tobacco: Never   Tobacco comments:    quit 2002  Vaping Use   Vaping status: Never Used  Substance and Sexual Activity   Alcohol use: No   Drug use: Never   Sexual activity: Not Currently    Birth control/protection: Surgical, None    Comment: hyst  Other Topics Concern   Not on file  Social History Narrative   Lives alone   2 grown children- one in [place] and one here   Grandchildren       Moved to [place] from [place] for aging mother who is 58 with Alzheimers      Maternal Aunt is Animator of the [employer] cancer call line      Enjoy: gym, time with friends       Diet: eats all food groups   Caffeine: 2 diet cokes daily    Water : 2-4 cups daily       Wears seat belt    Does not use phone while driving    Smoke and carbon detectors at home   [employer]    No weapons       Right Handed    Lives alone in a one story home    Retired   Social Drivers of Corporate investment banker Strain: Low Risk  (06/28/2023)   Overall Financial Resource Strain (CARDIA)    Difficulty of Paying Living Expenses: Not hard at all  Food Insecurity: No Food Insecurity (06/28/2023)   Hunger Vital Sign    Worried About Running Out of Food in the Last Year: Never true    Ran Out of Food in the Last Year: Never true  Transportation Needs: No Transportation Needs (06/28/2023)   PRAPARE - Administrator, Civil Service (Medical): No    Lack of Transportation (Non-Medical): No  Physical Activity: Sufficiently Active (06/28/2023)   Exercise Vital Sign    Days of Exercise per Week: 7 days    Minutes of Exercise per Session: 30 min  Stress: Patient Declined (06/28/2023)   [employer] of Occupational Health - Occupational Stress Questionnaire    Feeling of Stress : Patient declined  Social Connections: Moderately Integrated (06/28/2023)   Social Connection and Isolation Panel    Frequency of Communication with Friends and Family: More than three times  a week    Frequency of Social Gatherings  with Friends and Family: More than three times a week    Attends Religious Services: More than 4 times per year    Active Member of Clubs or Organizations: Yes    Attends Engineer, structural: More than 4 times per year    Marital Status: Divorced     Family History: The patient's family history includes Arthritis in her mother; Breast cancer in her maternal aunt, maternal grandmother, paternal aunt, and paternal aunt; Breast cancer (age of onset: 58) in her mother; COPD in her mother; Cancer in her father, maternal uncle, and another family member; Cancer (age of onset: 14) in her maternal aunt; Hearing loss in her mother; Prostate cancer in her maternal grandfather and maternal uncle; Stroke in her father. There is no history of Colon cancer, Allergic rhinitis, Angioedema, Asthma, Atopy, Eczema, Urticaria, or Immunodeficiency.  ROS:   Review of Systems  Cardiovascular:  Positive for leg swelling. Negative for chest pain, claudication, irregular heartbeat, near-syncope, orthopnea, palpitations, paroxysmal nocturnal dyspnea and syncope.  Respiratory:  Negative for shortness of breath.   Hematologic/Lymphatic: Negative for bleeding problem.    EKGs/Labs/Other Studies Reviewed:    The following studies were reviewed today: Echocardiogram 05/04/2020  1. Left ventricular ejection fraction, by estimation, is 60 to 65%. The left ventricle has normal function. The left ventricle has no regional wall motion abnormalities. There is mild left ventricular hypertrophy. Left ventricular diastolic parameters are indeterminate.   2. Right ventricular systolic function is normal. The right ventricular size is normal. Tricuspid regurgitation signal is inadequate for assessing PA pressure.   3. The mitral valve is grossly normal. Trivial mitral valve regurgitation.   4. The aortic valve is tricuspid. Aortic valve regurgitation is not visualized.   5. The  inferior vena cava is normal in size with greater than 50% respiratory variability, suggesting right atrial pressure of 3 mmHg.    EKG:   EKG Interpretation Date/Time:  Thursday October 25 2023 15:11:21 EDT Ventricular Rate:  97 PR Interval:  116 QRS Duration:  68 QT Interval:  350 QTC Calculation: 444 R Axis:   82  Text Interpretation: Normal sinus rhythm Right atrial enlargement When compared with ECG of 16-Oct-2023 14:37, Since last tracing rate faster Confirmed by Olinda Bertrand [phone]) on 10/25/2023 3:25:04 PM  CAC Scoring 05/23/2023 Coronary calcium score of 0. This was 0 percentile for age-, race-, and sex-matched controls.  Recent Labs: 09/13/2023: TSH 4.900 10/16/2023: ALT 15; B Natriuretic Peptide 12.0; BUN 21; Creatinine, Ser 1.03; Hemoglobin 14.0; Platelets 192; Potassium 4.1; Sodium 133  Recent Lipid Panel Lab Results  Component Value Date   CHOL 202 (H) 09/13/2023   HDL 56 09/13/2023   LDLCALC 137 (H) 09/13/2023   TRIG 51 09/13/2023   CHOLHDL 3.6 09/13/2023   12/01/2020 hemoglobin A1c 6.4%  Risk Assessment/Calculations:     NA  Physical Exam:    VS:  BP 106/66 (BP Location: Left Arm, Patient Position: Sitting, Cuff Size: Large)   Pulse 95   Resp 16   Ht 5' 6 (1.676 m)   Wt 230 lb 9.6 oz (104.6 kg)   SpO2 95%   BMI 37.22 kg/m     Wt Readings from Last 3 Encounters:  10/25/23 230 lb 9.6 oz (104.6 kg)  10/16/23 224 lb (101.6 kg)  10/10/23 224 lb (101.6 kg)     General: Alert, oriented x3, no distress, moderately obese Head: no evidence of trauma, PERRL, EOMI, no exophtalmos or lid lag, no myxedema, no xanthelasma; normal  ears, nose and oropharynx Neck: normal jugular venous pulsations and no hepatojugular reflux; brisk carotid pulses without delay and no carotid bruits Chest: clear to auscultation, symmetrical and full respiratory excursions Cardiovascular: normal position and quality of the apical impulse, regular rhythm, normal first and second heart  sounds, no murmurs, rubs or gallops Abdomen: no tenderness or distention, no masses by palpation, no abnormal pulsatility or arterial bruits, normal bowel sounds, no hepatosplenomegaly Extremities: bilateral 2+ soft pitting edema halfway up the shins, despite compression stockings Psych: Normal mood and affect   ASSESSMENT:    1. Precordial pain   2. Leg swelling   3. OSA (obstructive sleep apnea)   4. Severe obesity (BMI 35.0-39.9) with comorbidity (HCC)   5. Mixed hyperlipidemia     PLAN:    Precordial pain Predominantly noncardiac. EKG is nonischemic. Recent ED visit October 16, 2023 progress note reviewed independently.  Labs from 10/16/2023 independently reviewed. Most recent coronary calcium score reported to be 0. Last echocardiogram from December 2021 reviewed as part of today's office visit. Reassurance provided; however, patient is concerned enough to consider ischemic workup. We discussed coronary CTA; however patient wants to hold off as iodine is one of her allergies and does not want to be treated prior to the scan Alternatives discussed include exercise nuclear stress test versus pharmacological study. Patient states that she has an upcoming appointment with her orthopedic provider and if she is not able to put weight on her knee she is willing to proceed forward with a exercise nuclear stress test.  She will call us  back for having the test ordered.  Patient does understand that if she does not need 85% of age-predicted maximal heart rate for exercise nuclear stress test will be converted to pharmacological.  Patient is agreeable. Echo will be ordered to evaluate for structural heart disease and left ventricular systolic function. Reemphasized the importance of secondary prevention with focus on improving the modifiable cardiovascular risk factors such as glycemic control, lipid management, blood pressure control, weight loss.  Informed Consent   Shared Decision  Making/Informed Consent The risks [chest pain, shortness of breath, cardiac arrhythmias, dizziness, blood pressure fluctuations, myocardial infarction, stroke/transient ischemic attack, nausea, vomiting, allergic reaction, radiation exposure, metallic taste sensation and life-threatening complications (estimated to be 1 in 10,000)], benefits (risk stratification, diagnosing coronary artery disease, treatment guidance) and alternatives of a nuclear stress test were discussed in detail with Ms. Hilton and she agrees to proceed.    Leg swelling Chronic and stable. Has been evaluated by vascular and vein -who also agree that his swelling is secondary to dependent edema as well as lymphedema. She is not a candidate for venous ablation at the current time.  Conservative management is recommended.  Clinically she does not have any swelling recommended holding off on diuretic therapy to provide AKI as she is already having soft blood pressures at baseline. Recommended checking home weights on a regular basis and if she starts noticing a trend up and her blood pressures would allow diuretics can be used to manage her volume status.  Patient verbalized understanding  OSA (obstructive sleep apnea) Reemphasized importance of management  Severe obesity (BMI 35.0-39.9) with comorbidity (HCC) Body mass index is 37.22 kg/m. I reviewed with her importance of diet, regular physical activity/exercise, weight loss.   Patient is educated on the importance of increasing physical activity gradually as tolerated with a goal of moderate intensity exercise for 30 minutes a day 5 days a week.  Mixed hyperlipidemia Currently  not on lipid-lowering agents. Has been reluctant in the past. Had a coronary calcium score as an alternative to further risk stratify her which was reported to be 0. Will defer management to primary team/primary cardiology at this point.  Medication Adjustments/Labs and Tests Ordered: Current  medicines are reviewed at length with the patient today.  Concerns regarding medicines are outlined above.  Orders Placed This Encounter  Procedures   EKG 12-Lead   ECHOCARDIOGRAM COMPLETE   No orders of the defined types were placed in this encounter.   Patient Instructions  Medication Instructions:  No medication changes were made at this visit. Continue current regimen.   *If you need a refill on your cardiac medications before your next appointment, please call your pharmacy*  Lab Work: None ordered today. If you have labs (blood work) drawn today and your tests are completely normal, you will receive your results only by: MyChart Message (if you have MyChart) OR A paper copy in the mail If you have any lab test that is abnormal or we need to change your treatment, we will call you to review the results.  Testing/Procedures: Your physician has requested that you have an echocardiogram. Echocardiography is a painless test that uses sound waves to create images of your heart. It provides your doctor with information about the size and shape of your heart and how well your heart's chambers and valves are working. This procedure takes approximately one hour. There are no restrictions for this procedure. Please do NOT wear cologne, perfume, aftershave, or lotions (deodorant is allowed). Please arrive 15 minutes prior to your appointment time.  Please note: We ask at that you not bring children with you during ultrasound (echo/ vascular) testing. Due to room size and safety concerns, children are not allowed in the ultrasound rooms during exams. Our front office staff cannot provide observation of children in our lobby area while testing is being conducted. An adult accompanying a patient to their appointment will only be allowed in the ultrasound room at the discretion of the ultrasound technician under special circumstances. We apologize for any inconvenience.   Follow-Up: At [facility], you and your health needs are our priority.  As part of our continuing mission to provide you with exceptional heart care, our providers are all part of one team.  This team includes your primary Cardiologist (physician) and Advanced Practice Providers or APPs (Physician Assistants and Nurse Practitioners) who all work together to provide you with the care you need, when you need it.  Your next appointment:   3 month(s)  Provider:   Luana Rumple, MD     I spent 31 minutes in the care of Cherylee G Bellevue today including reviewing labs (10/16/2023), reviewing studies (coronary calcium score), face to face time discussing treatment options (echocardiogram and stress testing), reviewing records from ED (November 12, 2023), and documenting in the encounter.  Signed, Awilda Bogus, [facility]  A Division of [facility] [address] [place], [place] [address]  [place], [place] [address]

## 2023-10-27 ENCOUNTER — Encounter: Payer: Self-pay | Admitting: Cardiology

## 2023-10-31 ENCOUNTER — Ambulatory Visit: Admitting: Orthopedic Surgery

## 2023-11-02 ENCOUNTER — Encounter: Payer: Self-pay | Admitting: Internal Medicine

## 2023-11-05 ENCOUNTER — Other Ambulatory Visit: Payer: Self-pay | Admitting: Internal Medicine

## 2023-11-05 DIAGNOSIS — Z87892 Personal history of anaphylaxis: Secondary | ICD-10-CM | POA: Insufficient documentation

## 2023-11-05 DIAGNOSIS — R0602 Shortness of breath: Secondary | ICD-10-CM

## 2023-11-05 MED ORDER — ALBUTEROL SULFATE HFA 108 (90 BASE) MCG/ACT IN AERS: 1.0000 | INHALATION_SPRAY | Freq: Four times a day (QID) | RESPIRATORY_TRACT | 2 refills | Status: AC | PRN

## 2023-11-05 MED ORDER — EPINEPHRINE 0.3 MG/0.3ML IJ SOAJ
0.3000 mg | INTRAMUSCULAR | 2 refills | Status: AC | PRN
Start: 2023-11-05 — End: ?

## 2023-11-07 ENCOUNTER — Encounter: Payer: Self-pay | Admitting: Orthopedic Surgery

## 2023-11-07 ENCOUNTER — Ambulatory Visit: Admitting: Orthopedic Surgery

## 2023-11-07 VITALS — BP 105/59 | HR 87

## 2023-11-07 DIAGNOSIS — G8929 Other chronic pain: Secondary | ICD-10-CM | POA: Diagnosis not present

## 2023-11-07 DIAGNOSIS — T148XXA Other injury of unspecified body region, initial encounter: Secondary | ICD-10-CM | POA: Diagnosis not present

## 2023-11-07 DIAGNOSIS — M25561 Pain in right knee: Secondary | ICD-10-CM

## 2023-11-07 DIAGNOSIS — M25361 Other instability, right knee: Secondary | ICD-10-CM | POA: Diagnosis not present

## 2023-11-07 NOTE — Progress Notes (Signed)
 Return patient Visit  Assessment: Kara [name] is a 78 y.o. female with the following: 1. Chronic pain of right knee; mild to moderate arthritis  Plan: Verity G Hillhouse has continued right knee pain.  No specific injury recently.  Pain is intermittent.  She does have some buckling sensations.  She reports that she feels unstable, and her gait has been affected.  As result, we discussed proceeding with physical therapy.  A referral was placed today.  She can consider an injection in the future.   Follow-up: Return if symptoms worsen or fail to improve.  Subjective:  Chief Complaint  Patient presents with   Knee Pain    R here with more giving way pt was supposed to go on a cruise but due to the pain and giving way pt doesn't feel like she can go. Pt is in need of a letter stating her concerns/conditions to get refund on the trip.     History of Present Illness: Kara [name] is a 77 y.o. female who returns for evaluation of right knee pain.  I have followed her for right knee pain for a while.  At the last visit, she was feeling better.  Since then, she has pain intermittently.  She notes occasional buckling sensations.  She uses a cane to help with ambulation.  She feels weak.  She also notes that she is developing some back pain.   Review of Systems: No fevers or chills No numbness or tingling No chest pain No shortness of breath No bowel or bladder dysfunction No GI distress No headaches    Objective: BP (!) 105/59   Pulse 87   Physical Exam:  General: Alert and oriented. and No acute distress. Gait: Right sided antalgic gait.  Right knee with mild swelling.  Tenderness to palpation medially.  Mild tenderness to palpation within the tibial plateau.  She has good range of motion.  Toes warm well-perfused.  IMAGING: I personally ordered and reviewed the following images   No new imaging obtained today.   New Medications:  No orders of the defined types  were placed in this encounter.     Oneil DELENA Horde, MD  11/07/2023 3:34 PM

## 2023-11-07 NOTE — Patient Instructions (Addendum)
 Please provide a note for the patient:  To whom it may concern,   Kara [name] has been under my care for the pain for greater than 9 months.  At this point, I would recommend against prolonged walking or standing.  I do not think she is able to go on an extended trip to include flights in a large shift, due to her pain and overall instability.  Do not hesitate to contact me if you have any further issues.

## 2023-11-08 ENCOUNTER — Encounter: Payer: Self-pay | Admitting: Internal Medicine

## 2023-11-28 ENCOUNTER — Ambulatory Visit: Payer: Medicare Other

## 2023-11-29 DIAGNOSIS — M1711 Unilateral primary osteoarthritis, right knee: Secondary | ICD-10-CM | POA: Diagnosis not present

## 2023-12-10 ENCOUNTER — Other Ambulatory Visit (HOSPITAL_COMMUNITY)

## 2023-12-21 DIAGNOSIS — B880 Other acariasis: Secondary | ICD-10-CM | POA: Diagnosis not present

## 2023-12-21 DIAGNOSIS — H01001 Unspecified blepharitis right upper eyelid: Secondary | ICD-10-CM | POA: Diagnosis not present

## 2023-12-21 DIAGNOSIS — H01004 Unspecified blepharitis left upper eyelid: Secondary | ICD-10-CM | POA: Diagnosis not present

## 2023-12-25 ENCOUNTER — Ambulatory Visit

## 2023-12-25 VITALS — Ht 66.0 in | Wt 224.0 lb

## 2023-12-25 DIAGNOSIS — Z780 Asymptomatic menopausal state: Secondary | ICD-10-CM

## 2023-12-25 DIAGNOSIS — Z0000 Encounter for general adult medical examination without abnormal findings: Secondary | ICD-10-CM

## 2023-12-25 DIAGNOSIS — M7989 Other specified soft tissue disorders: Secondary | ICD-10-CM | POA: Diagnosis not present

## 2023-12-25 DIAGNOSIS — L989 Disorder of the skin and subcutaneous tissue, unspecified: Secondary | ICD-10-CM

## 2023-12-25 DIAGNOSIS — I890 Lymphedema, not elsewhere classified: Secondary | ICD-10-CM | POA: Diagnosis not present

## 2023-12-25 DIAGNOSIS — Z0001 Encounter for general adult medical examination with abnormal findings: Secondary | ICD-10-CM

## 2023-12-25 NOTE — Patient Instructions (Addendum)
 Kara [name] , Thank you for taking time out of your busy schedule to complete your Annual Wellness Visit with me. I enjoyed our conversation and look forward to speaking with you again next year. I, as well as your care team,  appreciate your ongoing commitment to your health goals. Please review the following plan we discussed and let me know if I can assist you in the future. Your Game plan/ To Do List     Referrals: If you haven't heard from the office you've been referred to, please reach out to them at the phone provided.  [facility] at [facility] [address] 4th Floor, [place],  [place]  [address] Phone: [phone]  Dermatology Referral [facility] Address: [address], [place], [place] [address] Phone: [phone]  Osteoporosis Screening/Yearly Mammogram: Please call the number below to schedule your appt.  Imaging at [facility] Phone: [phone]   Follow up Visits: We will see or speak with you next year for your Next Medicare AWV with our clinical staff  Clinician Recommendations:  Aim for 30 minutes of exercise or brisk walking, 6-8 glasses of water , and 5 servings of fruits and vegetables each [name].    Wishing you many blessings and good health during the next year until our next visit.  -Kara [name]   This is a list of the screenings recommended for you:  Health Maintenance  Topic Date Due   DEXA scan (bone density measurement)  03/30/2021   Zoster (Shingles) Vaccine (2 of 2) 08/01/2021   COVID-19 Vaccine (8 - Pfizer risk 2024-25 season) 10/29/2023   Eye exam for diabetics  11/22/2023   Medicare Annual Wellness Visit  11/22/2023   Flu Shot  12/14/2023   Hemoglobin A1C  04/10/2024   Mammogram  06/14/2024   Yearly kidney health urinalysis for diabetes  07/01/2024   Complete foot exam   07/01/2024   Yearly kidney function blood test for diabetes  10/15/2024   DTaP/Tdap/Td vaccine (2 -  Tdap) 12/22/2028   Pneumococcal Vaccine for age over 106  Completed   Hepatitis C Screening  Completed   Hepatitis B Vaccine  Aged Out   HPV Vaccine  Aged Out   Meningitis B Vaccine  Aged Out   Colon Cancer Screening  Discontinued    Advanced directives: (Declined) Advance directive discussed with you today. Even though you declined this today, please call our office should you change your mind, and we can give you the proper paperwork for you to fill out. Advance Care Planning is important because it:  [x]  Makes sure you receive the medical care that is consistent with your values, goals, and preferences  [x]  It provides guidance to your family and loved ones and reduces their decisional burden about whether or not they are making the right decisions based on your wishes.  Follow the link provided in your after visit summary or read over the paperwork we have mailed to you to help you started getting your Advance Directives in place. If you need assistance in completing these, please reach out to us  so that we can help you!  See attachments for Preventive Care and Fall Prevention Tips.

## 2023-12-25 NOTE — Progress Notes (Signed)
 Subjective:   Kara [name] is a 78 y.o. who presents for a Medicare Wellness preventive visit.  As a reminder, Annual Wellness Visits don't include a physical exam, and some assessments may be limited, especially if this visit is performed virtually. We may recommend an in-person follow-up visit with your provider if needed.  Visit Complete: Virtual I connected with  Kara [name] on 12/25/23 by a audio enabled telemedicine application and verified that I am speaking with the correct person using two identifiers.  Patient Location: Home  Provider Location: Home Office  I discussed the limitations of evaluation and management by telemedicine. The patient expressed understanding and agreed to proceed.  Vital Signs: Because this visit was a virtual/telehealth visit, some criteria may be missing or patient reported. Any vitals not documented were not able to be obtained and vitals that have been documented are patient reported.  VideoDeclined- This patient declined Librarian, academic. Therefore the visit was completed with audio only.  Persons Participating in Visit: Patient.  AWV Questionnaire: Yes: Patient Medicare AWV questionnaire was completed by the patient on 12/21/2023; I have confirmed that all information answered by patient is correct and no changes since this date.  Cardiac Risk Factors include: advanced age (>12men, >53 women);diabetes mellitus;dyslipidemia;hypertension;obesity (BMI >30kg/m2);Other (see comment), Risk factor comments: osa     Objective:    Today's Vitals   12/21/23 0904 12/25/23 1547  Weight:  224 lb (101.6 kg)  Height:  5' 6 (1.676 m)  PainSc: 6  6   PainLoc:  Knee   Body mass index is 36.15 kg/m.     12/25/2023    4:05 PM 10/16/2023    1:38 PM 07/18/2023   11:09 AM 12/07/2022   12:03 PM 11/23/2022   12:08 PM 05/02/2022   10:12 AM 04/28/2022   12:50 PM  Advanced Directives  Does Patient Have a Medical Advance  Directive? No No Yes No No Yes Yes  Type of Advance Directive   Living will;Healthcare Power of [employer] Power of [place];Living will Healthcare Power of [place];Living will;Out of facility DNR (pink MOST or yellow form)  Copy of Healthcare Power of Attorney in Chart?   No - copy requested      Would patient like information on creating a medical advance directive? No - Patient declined No - Patient declined         Current Medications (verified) Outpatient Encounter Medications as of 12/25/2023  Medication Sig   acetaminophen  (TYLENOL ) 500 MG tablet Take 500 mg by mouth every 8 (eight) hours as needed for moderate pain (pain score 4-6).   albuterol  (VENTOLIN  HFA) 108 (90 Base) MCG/ACT inhaler Inhale 1 puff into the lungs every 6 (six) hours as needed for wheezing or shortness of breath.   Carboxymethylcell-Glycerin PF (REFRESH RELIEVA PF) 0.5-1 % SOLN Place 1 drop into both eyes in the morning, at noon, in the evening, and at bedtime.   EPINEPHrine  0.3 mg/0.3 mL IJ SOAJ injection Inject 0.3 mg into the muscle as needed for anaphylaxis.   ibuprofen  (ADVIL ) 100 MG/5ML suspension Take 200 mg by mouth every 4 (four) hours as needed.   latanoprost (XALATAN) 0.005 % ophthalmic solution Place 1 drop into both eyes at bedtime.   metFORMIN  (GLUCOPHAGE -XR) 500 MG 24 hr tablet Take 1 tablet (500 mg total) by mouth daily after breakfast.   Misc. Devices MISC Compression stockings XL: 20-30 mm Hg   Multiple Vitamin (MULTIVITAMIN) capsule Take 1 capsule by mouth  daily.   SYNTHROID  100 MCG tablet Take 1 tablet (100 mcg total) by mouth daily before breakfast. (Patient taking differently: Take 100 mcg by mouth daily as needed (acid reflux).)   Thiamine  HCl (VITAMIN B-1 PO) Take 1 tablet by mouth in the morning.   hydrochlorothiazide  (HYDRODIURIL ) 12.5 MG tablet TAKE 1 TABLET(12.5 MG) BY MOUTH DAILY (Patient not taking: Reported on 10/25/2023)   pantoprazole  (PROTONIX ) 40 MG tablet Take 1 tablet (40  mg total) by mouth daily.   [DISCONTINUED] Misc Natural Products (SAMBUCUS ELDERBERRY VITAMIN C MT) Take 1 each by mouth daily. Gummy   No facility-administered encounter medications on file as of 12/25/2023.    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  The STAT Breast cancer panel offered by [facility] includes sequencing and rearrangement analysis for the following 9 genes:  ATM, BRCA1, BRCA2, CDH1, CHEK2, PALB2, PTEN, STK11 and TP53.   The report date is August 09, 2019.   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 fibroids   BIOPSY  05/02/2022   Procedure: BIOPSY;  Surgeon: Cindie Carlin POUR, DO;  [facility];  Service: Endoscopy;;   BREAST BIOPSY Left 05/2019   left breast DCIS   BREAST LUMPECTOMY Left 06/2019   DCIS   BREAST LUMPECTOMY WITH RADIOACTIVE SEED LOCALIZATION Left 07/08/2019   Procedure: LEFT BREAST LUMPECTOMY WITH RADIOACTIVE SEED LOCALIZATION;  Surgeon: Curvin Deward MOULD, MD;  Location: [facility];  Service: General;  Laterality: Left;   BREAST SURGERY N/A    [facility] 08/30/2020   CESAREAN SECTION     CESAREAN SECTION N/A    [employer] 08/30/2020   CHOLECYSTECTOMY     COLONOSCOPY  2011   [place] : two 3-4 mm sessile polyps, hyperplastic, sigmoid diverticula, TI appeared normal.    COLONOSCOPY WITH PROPOFOL  N/A 05/02/2022   Procedure: COLONOSCOPY WITH PROPOFOL ;  Surgeon:  Cindie Carlin POUR, DO;  Location: [facility];  Service: Endoscopy;  Laterality: N/A;  11:30 am   ESOPHAGOGASTRODUODENOSCOPY  2011   [place] : non-bleeding erosive gastropathy, normal duodenum. path: unremarkable duodenum, negative H.pylori, minimal esophagitis    ESOPHAGOGASTRODUODENOSCOPY N/A 11/23/2022   Procedure: ESOPHAGOGASTRODUODENOSCOPY (EGD);  Surgeon: Wilhelmenia Aloha Raddle., MD;  Location: [facility];  Service: Gastroenterology;  Laterality: N/A;   ESOPHAGOGASTRODUODENOSCOPY (EGD) WITH PROPOFOL  N/A 05/02/2022   Procedure: ESOPHAGOGASTRODUODENOSCOPY (EGD) WITH PROPOFOL ;  Surgeon: Cindie Carlin POUR, DO;  [facility];  Service: Endoscopy;  Laterality: N/A;   EUS N/A 11/23/2022   Procedure: UPPER ENDOSCOPIC ULTRASOUND (EUS) RADIAL;  Surgeon: Wilhelmenia Aloha Raddle., MD;  Location: [facility];  Service: Gastroenterology;  Laterality: N/A;   EYE SURGERY  2022   IR RADIOLOGIST EVAL & MGMT  06/20/2023   POLYPECTOMY  05/02/2022   Procedure:  POLYPECTOMY;  Surgeon: Cindie Carlin POUR, DO;  [facility];  Service: Endoscopy;;   REFRACTIVE SURGERY  2024   TONSILLECTOMY  1975   Family History  Problem Relation Age of Onset   Breast cancer Maternal Grandmother        dx over 60   Hyperlipidemia Maternal Grandmother    Prostate cancer Maternal Grandfather    Arthritis Mother    COPD Mother    Breast cancer Mother 77       second at age 24   Hearing loss Mother    Cancer Father        throat   Stroke Father    Cancer Maternal Aunt 3       [name], cervical cancer   Breast cancer Maternal Aunt        dx over 68   Breast cancer Paternal Aunt        dx under 50   Prostate cancer Maternal Uncle        dx over 65   Cancer Other        father's maternal cousin was Henreietta Lacks, Cervical   Cancer Maternal Uncle        unknown cancer   Breast cancer Paternal Aunt        dx over 52   Colon cancer Neg Hx    Allergic rhinitis Neg Hx    Angioedema Neg Hx    Asthma Neg Hx    Atopy Neg Hx    Eczema Neg Hx    Urticaria Neg Hx    Immunodeficiency Neg Hx    Social History   Socioeconomic History   Marital status: Divorced    Spouse name: Not on file   Number of children: 2   Years of education: 15   Highest education level: Some college, no degree  Occupational History   Occupation: retired    Comment: Loss adjuster, chartered at a Database administrator  Tobacco Use   Smoking status: Former    Current packs/day: 0.00    Average packs/day: 0.5 packs/day for 15.0 years (7.5 ttl pk-yrs)    Types: Cigarettes    Start date: 06/16/1983    Quit date: 06/15/1998    Years since quitting: 25.5   Smokeless tobacco: Never   Tobacco comments:    quit 2002  Vaping Use   Vaping status: Never Used  Substance and Sexual Activity   Alcohol use: No   Drug use: Never   Sexual activity: Not Currently    Birth control/protection: I.U.D., Pill, Surgical, None    Comment: hyst  Other Topics Concern  Not on file   Social History Narrative   Lives alone   2 grown children- one in [place] and one here   Scientist, 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[employer] for aging mother who is 68 with Alzheimers      Maternal Aunt is Animator of the [employer] cancer call line      Enjoy: gym, time with friends       Diet: eats all food groups   Caffeine: 2 diet cokes daily    Water : 2-4 cups daily       Wears seat belt    Does not use phone while driving    Smoke and carbon detectors at home   [employer]    No weapons       Right Handed    Lives alone in a one story home    Retired   Social Drivers of Corporate investment banker Strain: Low Risk  (12/21/2023)   Overall Financial Resource Strain (CARDIA)    Difficulty of Paying Living Expenses: Not hard at all  Food Insecurity: No Food Insecurity (12/21/2023)   Hunger Vital Sign    Worried About Running Out of Food in the Last Year: Never true    Ran Out of Food in the Last Year: Never true  Transportation Needs: No Transportation Needs (12/21/2023)   PRAPARE - Administrator, Civil Service (Medical): No    Lack of Transportation (Non-Medical): No  Physical Activity: Sufficiently Active (12/21/2023)   Exercise Vital Sign    Days of Exercise per Week: 7 days    Minutes of Exercise per Session: 30 min  Stress: No Stress Concern Present (12/21/2023)   [employer] of Occupational Health - Occupational Stress Questionnaire    Feeling of Stress: Not at all  Social Connections: Moderately Isolated (12/21/2023)   Social Connection and Isolation Panel    Frequency of Communication with Friends and Family: More than three times a week    Frequency of Social Gatherings with Friends and Family: More than three times a week    Attends Religious Services: Patient declined    Database administrator or Organizations: Yes    Attends Banker Meetings: 1 to 4 times per year    Marital Status: Divorced    Tobacco  Counseling Counseling given: Yes Tobacco comments: quit 2002    Clinical Intake:  Pre-visit preparation completed: Yes  Pain : 0-10 Pain Score: 6  Pain Type: Chronic pain Pain Location: Knee Pain Orientation: Right Pain Descriptors / Indicators: Constant Pain Onset: More than a month ago Pain Frequency: Constant     BMI - recorded: 36.15 Nutritional Status: BMI > 30  Obese Nutritional Risks: None Diabetes: No (per patient pre diabetic)  Lab Results  Component Value Date   HGBA1C 6.4 10/09/2023   HGBA1C 6.2 07/10/2023   HGBA1C 6.2 01/24/2023     How often do you need to have someone help you when you read instructions, pamphlets, or other written materials from your doctor or pharmacy?: 1 - Never  Interpreter Needed?: No  Information entered by :: Stefano ORN CMA-AAMa   Activities of Daily Living     12/21/2023    9:04 AM  In your present state of health, do you have any difficulty performing the following activities:  Hearing? 0  Vision? 0  Difficulty concentrating or making decisions? 0  Walking or climbing stairs? 0  Dressing or  bathing? 0  Doing errands, shopping? 0  Preparing Food and eating ? N  Using the Toilet? N  In the past six months, have you accidently leaked urine? N  Do you have problems with loss of bowel control? N  Managing your Medications? N  Managing your Finances? N  Housekeeping or managing your Housekeeping? N    Patient Care Team: Tobie Suzzane POUR, MD as PCP - General (Internal Medicine) Shaaron Lamar HERO, MD as Consulting Physician (Gastroenterology) Odean Potts, MD as Consulting Physician (Hematology and Oncology) Shannon Agent, MD as Consulting Physician (Radiation Oncology) Cindie Carlin POUR, DO as Consulting Physician (Gastroenterology) Leigh Venetia CROME, MD as Consulting Physician (Neurology) Croitoru, Jerel, MD as Consulting Physician (Cardiology) Fleeta Zerita DASEN, MD as Consulting Physician (Ophthalmology) Lenis Ethelle ORN, MD as Consulting Physician (Endocrinology) Onesimo Oneil LABOR, MD as Consulting Physician (Orthopedic Surgery)  I have updated your Care Teams any recent Medical Services you may have received from other providers in the past year.     Assessment:   This is a routine wellness examination for Kara [name].  Hearing/Vision screen Hearing Screening - Comments:: Patient denies any hearing difficulties.   Vision Screening - Comments:: Patient sees Dr. Fleeta. Up to date with exams.     Goals Addressed               This Visit's Progress     Eat Healthy (pt-stated)        I want to remain as active, healthy and independent as I possibly can.       Exercise 150 minutes per week (moderate activity)   On track      Depression Screen     12/25/2023    4:07 PM 07/02/2023    8:26 AM 05/24/2023    8:20 AM 01/22/2023    8:41 AM 01/03/2023   10:58 AM 11/22/2022   11:26 AM 07/17/2022    8:42 AM  PHQ 2/9 Scores  PHQ - 2 Score 0 0 0 0 0 0 0  PHQ- 9 Score 0 0     3    Fall Risk     12/21/2023    9:04 AM 10/01/2023    1:33 PM 07/02/2023    8:26 AM 05/24/2023    8:20 AM 01/22/2023    8:41 AM  Fall Risk   Falls in the past year? 0 0 0 0 0  Number falls in past yr: 0  0 0 0  Injury with Fall? 0  0 0 0  Risk for fall due to : No Fall Risks  No Fall Risks    Follow up Falls evaluation completed;Education provided;Falls prevention discussed  Falls evaluation completed Falls evaluation completed     MEDICARE RISK AT HOME:  Medicare Risk at Home Any stairs in or around the home?: (Patient-Rptd) No If so, are there any without handrails?: No Home free of loose throw rugs in walkways, pet beds, electrical cords, etc?: (Patient-Rptd) No Adequate lighting in your home to reduce risk of falls?: (Patient-Rptd) Yes Life alert?: (Patient-Rptd) No Use of a cane, walker or w/c?: (Patient-Rptd) No Grab bars in the bathroom?: (Patient-Rptd) No Shower chair or bench in shower?: (Patient-Rptd) No Elevated toilet  seat or a handicapped toilet?: (Patient-Rptd) Yes  TIMED UP AND GO:  Was the test performed?  No  Cognitive Function: 6CIT completed        12/25/2023    3:50 PM 11/22/2022   11:25 AM 10/31/2021   10:48 AM  6CIT Screen  What Year? 0 points 0 points 0 points  What month? 0 points 0 points 0 points  What time? 0 points 0 points 0 points  Count back from 20 0 points 0 points 0 points  Months in reverse 0 points 0 points 0 points  Repeat phrase 0 points 0 points 0 points  Total Score 0 points 0 points 0 points    Immunizations Immunization History  Administered Date(s) Administered    sv, Bivalent, Protein Subunit Rsvpref,pf (Abrysvo) 03/14/2023   Fluad Quad(high Dose 65+) 02/11/2020, 01/20/2021, 01/25/2022   Fluad Trivalent(High Dose 65+) 01/22/2023   Influenza, High Dose Seasonal PF 02/08/2018, 02/17/2019   Influenza,inj,Quad PF,6+ Mos 02/28/2017   Influenza-Unspecified 02/28/2017   Moderna Covid-19 Vaccine Bivalent Booster 16yrs & up 02/03/2022   PFIZER(Purple Top)SARS-COV-2 Vaccination 06/06/2019, 06/24/2019, 04/21/2020, 10/07/2020, 02/22/2021   Pneumococcal Conjugate-13 07/05/2016   Pneumococcal Polysaccharide-23 04/29/2019   Td 12/23/2018   Unspecified SARS-COV-2 Vaccination 04/30/2023   Zoster Recombinant(Shingrix) 06/06/2021    Screening Tests Health Maintenance  Topic Date Due   DEXA SCAN  03/30/2021   Zoster Vaccines- Shingrix (2 of 2) 08/01/2021   COVID-19 Vaccine (8 - Pfizer risk 2024-25 season) 10/29/2023   OPHTHALMOLOGY EXAM  11/22/2023   Medicare Annual Wellness (AWV)  11/22/2023   INFLUENZA VACCINE  12/14/2023   HEMOGLOBIN A1C  04/10/2024   MAMMOGRAM  06/14/2024   Diabetic kidney evaluation - Urine ACR  07/01/2024   FOOT EXAM  07/01/2024   Diabetic kidney evaluation - eGFR measurement  10/15/2024   DTaP/Tdap/Td (2 - Tdap) 12/22/2028   Pneumococcal Vaccine: 50+ Years  Completed   Hepatitis C Screening  Completed   Hepatitis B Vaccines  Aged Out    HPV VACCINES  Aged Out   Meningococcal B Vaccine  Aged Out   Colonoscopy  Discontinued    Health Maintenance  Health Maintenance Due  Topic Date Due   DEXA SCAN  03/30/2021   Zoster Vaccines- Shingrix (2 of 2) 08/01/2021   COVID-19 Vaccine (8 - Pfizer risk 2024-25 season) 10/29/2023   OPHTHALMOLOGY EXAM  11/22/2023   Medicare Annual Wellness (AWV)  11/22/2023   INFLUENZA VACCINE  12/14/2023   Health Maintenance Items Addressed: DEXA ordered  Additional Screening:  Vision Screening: Recommended annual ophthalmology exams for early detection of glaucoma and other disorders of the eye. Would you like a referral to an eye doctor? No    Dental Screening: Recommended annual dental exams for proper oral hygiene  Community Resource Referral / Chronic Care Management: CRR required this visit?  No   CCM required this visit?  No   Plan:    I have personally reviewed and noted the following in the patient's chart:   Medical and social history Use of alcohol, tobacco or illicit drugs  Current medications and supplements including opioid prescriptions. Patient is not currently taking opioid prescriptions. Functional ability and status Nutritional status Physical activity Advanced directives List of other physicians Hospitalizations, surgeries, and ER visits in previous 12 months Vitals Screenings to include cognitive, depression, and falls Referrals and appointments  In addition, I have reviewed and discussed with patient certain preventive protocols, quality metrics, and best practice recommendations. A written personalized care plan for preventive services as well as general preventive health recommendations were provided to patient.   Aneesh Faller, CMA   12/25/2023   After Visit Summary: (MyChart) Due to this being a telephonic visit, the after visit summary with patients personalized plan was offered to patient via MyChart  Notes: Please refer to Routing Comments.

## 2023-12-27 NOTE — Therapy (Signed)
 OUTPATIENT PHYSICAL THERAPY LOWER EXTREMITY EVALUATION   Patient Name: Kara [name] MRN: [id number] DOB:Nov 18, 1945, 78 y.o., female Today's Date: 12/28/2023  END OF SESSION:  PT End of Session - 12/28/23 1517     Visit Number 1    Number of Visits 8    Date for PT Re-Evaluation 01/25/24    Authorization Type medicare/ BCBS secondary    Progress Note Due on Visit 10    PT Start Time 1304    PT Stop Time 1344    PT Time Calculation (min) 40 min    Activity Tolerance Patient tolerated treatment well    Behavior During Therapy [facility] for tasks assessed/performed          Past Medical History:  Diagnosis Date   Allergy     food allergies, drug allergies   Anemia    Anxiety    Arthritis    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  The STAT Breast cancer panel offered by [facility] includes sequencing and rearrangement analysis for the following 9 genes:  ATM, BRCA1, BRCA2, CDH1, CHEK2, PALB2, PTEN, STK11 and TP53.   The report date is August 09, 2019.   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 fibroids   BIOPSY  05/02/2022   Procedure: BIOPSY;  Surgeon: Cindie Carlin POUR, DO;  [facility];  Service: Endoscopy;;   BREAST BIOPSY Left 05/2019   left breast DCIS   BREAST LUMPECTOMY Left 06/2019   DCIS   BREAST LUMPECTOMY WITH RADIOACTIVE SEED LOCALIZATION Left 07/08/2019   Procedure: LEFT BREAST LUMPECTOMY WITH RADIOACTIVE SEED LOCALIZATION;  Surgeon: Curvin Deward MOULD, MD;  Location: [facility];  Service: General;  Laterality: Left;   BREAST SURGERY N/A    Phreesia 08/30/2020   CESAREAN SECTION     CESAREAN SECTION N/A    Phreesia 08/30/2020   CHOLECYSTECTOMY     COLONOSCOPY  2011   [place] : two 3-4 mm sessile polyps, hyperplastic, sigmoid diverticula, TI appeared normal.    COLONOSCOPY WITH PROPOFOL  N/A 05/02/2022   Procedure: COLONOSCOPY WITH PROPOFOL ;  Surgeon: Cindie Carlin POUR, DO;  [facility]:  [facility];  Service: Endoscopy;  Laterality: N/A;  11:30 am   ESOPHAGOGASTRODUODENOSCOPY  2011   [place] : non-bleeding erosive gastropathy, normal duodenum. path: unremarkable duodenum, negative H.pylori, minimal esophagitis    ESOPHAGOGASTRODUODENOSCOPY N/A 11/23/2022   Procedure: ESOPHAGOGASTRODUODENOSCOPY (EGD);  Surgeon: Wilhelmenia Aloha Raddle., MD;  Location: [facility];  Service: Gastroenterology;  Laterality: N/A;   ESOPHAGOGASTRODUODENOSCOPY (EGD) WITH PROPOFOL  N/A 05/02/2022   Procedure: ESOPHAGOGASTRODUODENOSCOPY (EGD) WITH PROPOFOL ;  Surgeon: Cindie Carlin POUR, DO;  [facility];  Service: Endoscopy;  Laterality: N/A;   EUS N/A 11/23/2022   Procedure:  UPPER ENDOSCOPIC ULTRASOUND (EUS) RADIAL;  Surgeon: Wilhelmenia Aloha Raddle., MD;  Location: [facility];  Service: Gastroenterology;  Laterality: N/A;   EYE SURGERY  2022   IR RADIOLOGIST EVAL & MGMT  06/20/2023   POLYPECTOMY  05/02/2022   Procedure: POLYPECTOMY;  Surgeon: Cindie Carlin POUR, DO;  [facility];  Service: Endoscopy;;   REFRACTIVE SURGERY  2024   TONSILLECTOMY  1975   Patient Active Problem List   Diagnosis Date Noted   History of anaphylaxis 11/05/2023   Class 2 severe obesity due to excess calories with serious comorbidity and body mass index (BMI) of 35.0 to 35.9 in adult (HCC) 07/10/2023   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   Obesity (BMI 30-39.9) 05/24/2023   OSA (obstructive sleep apnea) 07/21/2022   H/o Lyme disease 07/21/2022   Hypotension 07/03/2022   Idiopathic peripheral neuropathy 04/12/2022   RUQ pain 04/05/2022   Encounter for screening colonoscopy 04/05/2022   Leg swelling 05/25/2021   Vitamin D  deficiency 03/11/2021   Plantar fasciitis of right foot 10/13/2020   Trigger middle finger of right hand 10/13/2020   Intermittent palpitations 04/29/2020   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   Shortness of breath 02/04/2020   COVID-19 01/14/2020   Trochanteric bursitis of left hip 12/30/2019   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   Hypothyroidism following radioiodine therapy 03/06/2019   Prediabetes 08/30/2017   Mixed hyperlipidemia 08/23/2016   GERD (gastroesophageal reflux disease) 08/14/2016    PCP: Tobie Suzzane POUR, MD  REFERRING PROVIDER: Onesimo Oneil LABOR, MD  REFERRING DIAG: [phone] (ICD-10-CM) - Chronic pain of right knee T14.8XXA (ICD-10-CM) - Bone bruise M25.361 (ICD-10-CM) - Instability of knee joint, right  THERAPY DIAG:  Right knee pain, unspecified chronicity  Difficulty in walking, not elsewhere classified  Impaired functional mobility, balance, gait, and  endurance  Rationale for Evaluation and Treatment: Rehabilitation  ONSET DATE: pain since Nov of 2024  SUBJECTIVE:   SUBJECTIVE STATEMENT: Right knee pain started hurting in Nov; was told she had an infection per MRI top of tibia but was not given antibiotic; was told to stay off the knee so she has periodically been using RW or cane off and on.  Has affected the way she walks and this makes her back hurt.  She is favoring that side and sometimes that right knee will give way.  Has seen Dr Onesimo 3 times; given an injection on first visit and that did not help at all.  Also had a second opinion in [place]  but has not heard back from them.  That MD wanted her to have a visco supplementation injection.  Referred to therapy.    PERTINENT HISTORY: Lymphedema bilateral Lower extremities  PAIN:  Are you having pain? Yes: NPRS scale: 0/10; 7/10 at worst Pain location: inside and across the front Pain description: gives way Aggravating factors: gives way Relieving factors: rest;  ice  PRECAUTIONS: None  RED FLAGS: None   WEIGHT BEARING RESTRICTIONS: No  FALLS:  Has patient fallen in last 6 months? No  OCCUPATION: retired  PLOF: Independent  PATIENT GOALS: get more strength back in my legs and be more stable; be able to walk more; walk again  NEXT MD VISIT: Sept  OBJECTIVE:  Note: Objective measures were completed at Evaluation unless otherwise noted.  DIAGNOSTIC FINDINGS:   CLINICAL DATA:  Meniscal injury, knee   Right knee instability for 3 months. No known injury or prior relevant surgery.   EXAM: MRI OF THE RIGHT KNEE WITHOUT CONTRAST   TECHNIQUE: Multiplanar, multisequence MR imaging of the knee was performed. No intravenous contrast was administered.   COMPARISON:  Radiographs 02/13/2023.  MRI 08/16/2019.   FINDINGS: MENISCI   Medial meniscus:  Intact with normal morphology.   Lateral meniscus: On the previous MRI, there was an apparent large parameniscal  cyst peripherally which is no longer present. The lateral meniscus appears degenerated with mild free edge fraying, although no discrete meniscal tear or displaced meniscal fragment is demonstrated.   LIGAMENTS   Cruciates: The anterior and posterior cruciate ligaments are intact.   Collaterals: The medial and lateral collateral ligament complexes are intact.   CARTILAGE   Patellofemoral:  Preserved.   Medial: Mild to moderate chondral thinning and surface irregularity. No full-thickness chondral defect.   Lateral: Mild to moderate chondral thinning and surface irregularity. No full-thickness chondral defect.   MISCELLANEOUS   Joint:  Small joint effusion.   Popliteal Fossa: The popliteus muscle and tendon are intact. Small Baker's cyst.   Extensor Mechanism: Stable mild proximal patellar tendinosis. The quadriceps tendon appears normal. The patellar retinacula are intact.   Bones: New prominent marrow edema throughout the lateral tibial plateau, greatest posteriorly. Questionable underlying subchondral insufficiency fracture, best seen on the sagittal images. Mild surrounding soft tissue edema. No other significant osseous abnormalities are identified.   Other: No other significant periarticular soft tissue findings.   IMPRESSION: 1. New prominent marrow edema throughout the lateral tibial plateau, greatest posteriorly, with questionable underlying subchondral insufficiency fracture. 2. The lateral meniscus appears degenerated with mild free edge fraying, although no discrete meniscal tear or displaced meniscal fragment is demonstrated. No residual parameniscal cyst is seen. 3. The medial meniscus, cruciate and collateral ligaments are intact. 4. 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 5. Small joint effusion and small Baker's cyst.     Electronically Signed   By: Elsie Perone M.D.   On: 07/19/2023 15:27  PATIENT SURVEYS:   LEFS  Extreme difficulty/unable (0), Quite a bit of difficulty (1), Moderate difficulty (2), Little difficulty (3), No difficulty (4) Survey date:    Any of your usual work, housework or school activities   2. Usual hobbies, recreational or sporting activities   3. Getting into/out of the bath   4. Walking between rooms   5. Putting on socks/shoes   6. Squatting    7. Lifting an object, like a bag of groceries from the floor   8. Performing light activities around your home   9. Performing heavy activities around your home   10. Getting into/out of a car   11. Walking 2 blocks   12. Walking 1 mile   13. Going up/down 10 stairs (1 flight)   14. Standing for 1 hour   15.  sitting for 1 hour   16. Running on even ground   17. Running on uneven ground  18. Making sharp turns while running fast   19. Hopping    20. Rolling over in bed   Score total:  50/80; 62.5%     COGNITION: Overall cognitive status: Within functional limits for tasks assessed     SENSATION: None currently  EDEMA:  Both legs swell; lymphedema both legs   POSTURE: rounded shoulders and forward head  PALPATION: General soreness  LOWER EXTREMITY MMTS  MMTs Right eval Left eval  Hip flexion 3+ 4+  Hip extension    Hip abduction    Hip adduction    Hip internal rotation    Hip external rotation    Knee flexion    Knee extension 4- 4+  Ankle dorsiflexion 4- 4-  Ankle plantarflexion    Ankle inversion    Ankle eversion     (Blank rows = not tested)  LOWER EXTREMITY ROM  ROM Right eval Left eval  Hip flexion    Hip extension    Hip abduction    Hip adduction    Hip internal rotation    Hip external rotation    Knee flexion 105   Knee extension -5 0  Ankle dorsiflexion    Ankle plantarflexion    Ankle inversion    Ankle eversion     (Blank rows = not tested)    FUNCTIONAL TESTS:  5 times sit to stand: 17.34  sec hands on knees Timed up and go (TUG): next visit 2 minute walk  test: next visit SLS right 5 and left 1  GAIT: Distance walked: 60 ft in clinic Assistive device utilized: None Level of assistance: Complete Independence Comments: slow gait speed; cautious                                                                                                                                TREATMENT DATE: 12/28/23 physical therapy evaluation and HEP instruction    PATIENT EDUCATION:  Education details: Patient educated on exam findings, POC, scope of PT, HEP, and what to expect next visit. Person educated: Patient Education method: Explanation, Demonstration, and Handouts Education comprehension: verbalized understanding, returned demonstration, verbal cues required, and tactile cues required  HOME EXERCISE PROGRAM: Access Code: [email or web address] URL: [email or web address] Date: 12/28/2023 Prepared by: [facility]  Exercises - Supine Quad Set  - 2 x daily - 7 x weekly - 1 sets - 10 reps - 5 sec hold - Sit to Stand Without Arm Support  - 2 x daily - 7 x weekly - 1 sets - 10 reps - standing single leg balance at the counter (try to not hold on)  - 2 x daily - 7 x weekly - 1 sets - 5 reps - 30 sec hold  ASSESSMENT:  CLINICAL IMPRESSION: Patient is a 78 y.o. female who was seen today for physical therapy evaluation and treatment for M25.561,G89.29 (ICD-10-CM) - Chronic pain of right knee T14.8XXA (ICD-10-CM) - Bone bruise M25.361 (ICD-10-CM) -  Instability of knee joint, right. Patient demonstrates muscle weakness, reduced ROM, and fascial restrictions which are likely contributing to symptoms of pain and are negatively impacting patient ability to perform ADLs and functional mobility tasks. Patient will benefit from skilled physical therapy services to address these deficits to reduce pain and improve level of function with ADLs and functional mobility tasks.   OBJECTIVE IMPAIRMENTS: Abnormal gait, decreased activity tolerance, decreased balance,  decreased mobility, difficulty walking, decreased ROM, decreased strength, increased edema, increased fascial restrictions, impaired perceived functional ability, and pain.   ACTIVITY LIMITATIONS: standing, squatting, transfers, and locomotion level  PARTICIPATION LIMITATIONS: meal prep and shopping  REHAB POTENTIAL: Good  CLINICAL DECISION MAKING: Evolving/moderate complexity  EVALUATION COMPLEXITY: Moderate   GOALS: Goals reviewed with patient? No  SHORT TERM GOALS: Target date: 01/11/2024 patient will be independent with initial HEP  Baseline: Goal status: INITIAL  2.  Patient will report 30% improvement overall  Baseline:  Goal status: INITIAL   LONG TERM GOALS: Target date: 01/25/2024  Patient will be independent in self management strategies to improve quality of life and functional outcomes.  Baseline:  Goal status: INITIAL  2.  Patient will report 50% improvement overall  Baseline:  Goal status: INITIAL  3.  Patient will able to stand SLS on each leg x 10 to demonstrate improved functional balance  Baseline:  Goal status: INITIAL  4.  Patient will improve 5 times sit to stand score to 15 sec or less to demonstrate improved functional mobility and increased leg strength.    Baseline: 17.34 Goal status: INITIAL  5.  Patient will increase knee mobility to 0 to 120 to promote normal navigation of steps; step over step pattern  Baseline:  Goal status: INITIAL  6.   Patient will increase right leg MMT's to 5/5 to allow navigation of steps without gait deviation or loss of balance  Baseline:  Goal status: INITIAL   PLAN:  PT FREQUENCY: 2x/week  PT DURATION: 4 weeks  PLANNED INTERVENTIONS: [id number]- PT Re-evaluation, 97110-Therapeutic exercises, 97530- Therapeutic activity, 97112- Neuromuscular re-education, 97535- Self Care, [id number]- Manual therapy, [id number]- Gait training, [phone]- Orthotic Fit/training, [phone]- Canalith repositioning, [id number]- Aquatic  Therapy, 97760- Splinting, [phone]- Wound care (first 20 sq cm), 97598- Wound care (each additional 20 sq cm)Patient/Family education, Balance training, Stair training, Taping, Dry Needling, Joint mobilization, Joint manipulation, Spinal manipulation, Spinal mobilization, Scar mobilization, and DME instructions.   PLAN FOR NEXT SESSION: Review HEP and goals; right knee mobility and strength; balance  3:18 PM, 12/28/23 Kaesha Kirsch Small Macarena Langseth MPT [facility] [place] [phone] [phone]

## 2023-12-28 ENCOUNTER — Ambulatory Visit (HOSPITAL_COMMUNITY): Attending: Orthopedic Surgery

## 2023-12-28 ENCOUNTER — Other Ambulatory Visit: Payer: Self-pay

## 2023-12-28 DIAGNOSIS — M25561 Pain in right knee: Secondary | ICD-10-CM | POA: Insufficient documentation

## 2023-12-28 DIAGNOSIS — G8929 Other chronic pain: Secondary | ICD-10-CM | POA: Insufficient documentation

## 2023-12-28 DIAGNOSIS — R262 Difficulty in walking, not elsewhere classified: Secondary | ICD-10-CM | POA: Diagnosis not present

## 2023-12-28 DIAGNOSIS — M25361 Other instability, right knee: Secondary | ICD-10-CM | POA: Diagnosis not present

## 2023-12-28 DIAGNOSIS — Z7409 Other reduced mobility: Secondary | ICD-10-CM | POA: Insufficient documentation

## 2023-12-28 DIAGNOSIS — T148XXA Other injury of unspecified body region, initial encounter: Secondary | ICD-10-CM | POA: Diagnosis not present

## 2023-12-29 ENCOUNTER — Other Ambulatory Visit: Payer: Self-pay | Admitting: "Endocrinology

## 2023-12-29 DIAGNOSIS — E890 Postprocedural hypothyroidism: Secondary | ICD-10-CM

## 2023-12-31 ENCOUNTER — Encounter: Payer: Self-pay | Admitting: Internal Medicine

## 2023-12-31 ENCOUNTER — Ambulatory Visit (INDEPENDENT_AMBULATORY_CARE_PROVIDER_SITE_OTHER): Payer: Medicare Other | Admitting: Internal Medicine

## 2023-12-31 VITALS — BP 107/68 | HR 73 | Ht 66.0 in | Wt 229.8 lb

## 2023-12-31 DIAGNOSIS — R7303 Prediabetes: Secondary | ICD-10-CM | POA: Diagnosis not present

## 2023-12-31 DIAGNOSIS — G4733 Obstructive sleep apnea (adult) (pediatric): Secondary | ICD-10-CM

## 2023-12-31 DIAGNOSIS — E890 Postprocedural hypothyroidism: Secondary | ICD-10-CM | POA: Diagnosis not present

## 2023-12-31 DIAGNOSIS — K219 Gastro-esophageal reflux disease without esophagitis: Secondary | ICD-10-CM

## 2023-12-31 DIAGNOSIS — M7989 Other specified soft tissue disorders: Secondary | ICD-10-CM | POA: Diagnosis not present

## 2023-12-31 MED ORDER — TIRZEPATIDE-WEIGHT MANAGEMENT 2.5 MG/0.5ML ~~LOC~~ SOAJ: 2.5000 mg | SUBCUTANEOUS | 0 refills | Status: DC

## 2023-12-31 NOTE — Progress Notes (Signed)
 Established Patient Office Visit  Subjective:  Patient ID: Kara [name], female    DOB: [birth year]  Age: 78 y.o. MRN: [id number]  CC:  Chief Complaint  Patient presents with   Medical Management of Chronic Issues    6 month f/u     HPI Kara [name] is a 78 y.o. female with past medical history of GERD and hypothyroidism who presents for f/u of her chronic medical conditions.  She has been taking Synthroid  regularly for her hypothyroidism.  Denies any recent change in weight or appetite.  She was advised to get sleep study by optometrist.  She is unsure of snoring at nighttime.  She has daytime fatigue, chronic.  Denies known episode of apnea. Sleep study showed moderate OSA. She received CPAP device, but has suffocation even with nasal mask.  She still complains of persistent leg swelling.  She was told to stop taking HCTZ by her cardiologist.  She had been having dizziness even with HCTZ 12.5 mg every other day. She had dizziness with Lasix , likely due to orthostatic hypotension.  She has tried leg elevation and compression socks without much relief.  She uses sequential compression device twice in a week, and feels better after using it. She was referred to Vascular surgery by Cardiology for consideration of saphenous vein ablation, but was advised conservative treatment for now. She denies orthopnea or PND currently.  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  The STAT Breast cancer panel offered by [facility] includes sequencing and rearrangement analysis for the following 9 genes:  ATM, BRCA1, BRCA2, CDH1, CHEK2, PALB2, PTEN, STK11 and TP53.   The report date is August 09, 2019.   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 fibroids   BIOPSY  05/02/2022   Procedure: BIOPSY;  Surgeon: Cindie Carlin POUR, DO;  Location: [facility];  Service: Endoscopy;;   BREAST BIOPSY Left 05/2019   left breast DCIS  BREAST LUMPECTOMY Left 06/2019   DCIS   BREAST LUMPECTOMY WITH RADIOACTIVE SEED LOCALIZATION Left 07/08/2019   Procedure: LEFT BREAST LUMPECTOMY WITH RADIOACTIVE SEED LOCALIZATION;  Surgeon: Curvin Deward MOULD, MD;  Location: [facility];  Service: General;  Laterality: Left;   BREAST SURGERY N/A    Phreesia 08/30/2020   CESAREAN SECTION     CESAREAN SECTION N/A    Phreesia 08/30/2020   CHOLECYSTECTOMY     COLONOSCOPY  2011   [place] : two 3-4 mm sessile polyps, hyperplastic, sigmoid diverticula, TI appeared normal.    COLONOSCOPY WITH PROPOFOL  N/A 05/02/2022    Procedure: COLONOSCOPY WITH PROPOFOL ;  Surgeon: Cindie Carlin POUR, DO;  Location: [facility];  Service: Endoscopy;  Laterality: N/A;  11:30 am   ESOPHAGOGASTRODUODENOSCOPY  2011   [place] : non-bleeding erosive gastropathy, normal duodenum. path: unremarkable duodenum, negative H.pylori, minimal esophagitis    ESOPHAGOGASTRODUODENOSCOPY N/A 11/23/2022   Procedure: ESOPHAGOGASTRODUODENOSCOPY (EGD);  Surgeon: Wilhelmenia Aloha Raddle., MD;  Location: [facility];  Service: Gastroenterology;  Laterality: N/A;   ESOPHAGOGASTRODUODENOSCOPY (EGD) WITH PROPOFOL  N/A 05/02/2022   Procedure: ESOPHAGOGASTRODUODENOSCOPY (EGD) WITH PROPOFOL ;  Surgeon: Cindie Carlin POUR, DO;  [facility];  Service: Endoscopy;  Laterality: N/A;   EUS N/A 11/23/2022   Procedure: UPPER ENDOSCOPIC ULTRASOUND (EUS) RADIAL;  Surgeon: Wilhelmenia Aloha Raddle., MD;  Location: [facility];  Service: Gastroenterology;  Laterality: N/A;   EYE SURGERY  2022   IR RADIOLOGIST EVAL & MGMT  06/20/2023   POLYPECTOMY  05/02/2022   Procedure: POLYPECTOMY;  Surgeon: Cindie Carlin POUR, DO;  Location: [facility];  Service: Endoscopy;;   REFRACTIVE SURGERY  2024   TONSILLECTOMY  1975    Family History  Problem Relation Age of Onset   Breast cancer Maternal Grandmother        dx over 58   Hyperlipidemia Maternal Grandmother    Prostate cancer Maternal Grandfather    Arthritis Mother    COPD Mother    Breast cancer Mother 65       second at age 47   Hearing loss Mother    Cancer Father        throat   Stroke Father    Cancer Maternal Aunt 15       [name], cervical cancer   Breast cancer Maternal Aunt        dx over 47   Breast cancer Paternal Aunt        dx under 50   Prostate cancer Maternal Uncle        dx over 75   Cancer Other        father's maternal cousin was Henreietta Lacks, Cervical   Cancer Maternal Uncle        unknown cancer   Breast cancer Paternal Aunt        dx over 6   Colon cancer Neg  Hx    Allergic rhinitis Neg Hx    Angioedema Neg Hx    Asthma Neg Hx    Atopy Neg Hx    Eczema Neg Hx    Urticaria Neg Hx    Immunodeficiency Neg Hx     Social History   Socioeconomic History   Marital status: Divorced    Spouse name: Not on file   Number of children: 2   Years of education: 15   Highest education level: Some college, no degree  Occupational History   Occupation: retired    Comment: Loss adjuster, chartered at a Database administrator  Tobacco Use   Smoking status: Former    Current packs/day: 0.00    Average packs/day: 0.5 packs/day for 15.0 years (7.5 ttl pk-yrs)    Types: Cigarettes    Start date: 06/16/1983    Quit date: 06/15/1998    Years since quitting: 25.5   Smokeless tobacco: Never   Tobacco comments:    quit 2002  Vaping Use   Vaping status: Never Used  Substance and Sexual Activity   Alcohol use: No   Drug use: Never   Sexual activity: Not Currently    Birth control/protection: I.U.D., Pill, Surgical, None    Comment: hyst  Other Topics Concern   Not on file  Social History Narrative   Lives alone   2 grown children- one in [place] and one here   Grandchildren       Moved to [place] from [place] for aging mother who is 43 with Alzheimers      Maternal Aunt is Animator of the [employer] cancer call line      Enjoy: gym, time with friends       Diet: eats all food groups   Caffeine: 2 diet cokes daily    Water : 2-4 cups daily       Wears seat belt    Does not use phone while driving    Smoke and carbon detectors at home   [employer]    No weapons       Right Handed    Lives alone in a one story home    Retired   Social Drivers of Corporate investment banker Strain: Low Risk  (12/21/2023)   Overall Financial Resource Strain (CARDIA)    Difficulty of Paying Living Expenses: Not hard at all  Food Insecurity: No Food Insecurity (12/21/2023)   Hunger Vital Sign    Worried About Running Out of Food in the Last Year:  Never true    Ran Out of Food in the Last Year: Never true  Transportation Needs: No Transportation Needs (12/21/2023)   PRAPARE - Administrator, Civil Service (Medical): No    Lack of Transportation (Non-Medical): No  Physical Activity: Sufficiently Active (12/21/2023)   Exercise Vital Sign    Days of Exercise per Week: 7 days    Minutes of Exercise per Session: 30 min  Stress: No Stress Concern Present (12/21/2023)   [employer] of Occupational Health - Occupational Stress Questionnaire    Feeling of Stress: Not at all  Social Connections: Moderately Isolated (12/21/2023)   Social Connection and Isolation Panel    Frequency of Communication with Friends and Family: More than three times a week    Frequency of Social Gatherings with Friends and Family: More than three times a week    Attends Religious Services: Patient declined    Database administrator or Organizations: Yes    Attends Banker Meetings: 1 to 4 times per year    Marital Status: Divorced  Intimate Partner Violence: Not At Risk (12/25/2023)   Humiliation, Afraid, Rape, and Kick questionnaire    Fear of Current or Ex-Partner: No    Emotionally Abused: No    Physically Abused: No    Sexually Abused: No    Outpatient Medications Prior to Visit  Medication Sig Dispense Refill   acetaminophen  (TYLENOL ) 500 MG tablet Take 500 mg by mouth every 8 (eight) hours as needed for moderate pain (pain score 4-6).     albuterol  (  VENTOLIN  HFA) 108 (90 Base) MCG/ACT inhaler Inhale 1 puff into the lungs every 6 (six) hours as needed for wheezing or shortness of breath. 18 g 2   Carboxymethylcell-Glycerin PF (REFRESH RELIEVA PF) 0.5-1 % SOLN Place 1 drop into both eyes in the morning, at noon, in the evening, and at bedtime.     EPINEPHrine  0.3 mg/0.3 mL IJ SOAJ injection Inject 0.3 mg into the muscle as needed for anaphylaxis. 1 each 2   ibuprofen  (ADVIL ) 100 MG/5ML suspension Take 200 mg by mouth every 4 (four)  hours as needed.     latanoprost (XALATAN) 0.005 % ophthalmic solution Place 1 drop into both eyes at bedtime.     metFORMIN  (GLUCOPHAGE -XR) 500 MG 24 hr tablet Take 1 tablet (500 mg total) by mouth daily after breakfast. 90 tablet 1   Misc. Devices MISC Compression stockings XL: 20-30 mm Hg 1 each 0   Multiple Vitamin (MULTIVITAMIN) capsule Take 1 capsule by mouth daily.     pantoprazole  (PROTONIX ) 40 MG tablet Take 1 tablet (40 mg total) by mouth daily. (Patient taking differently: Take 40 mg by mouth as needed.) 30 tablet 0   Thiamine  HCl (VITAMIN B-1 PO) Take 1 tablet by mouth in the morning.     SYNTHROID  100 MCG tablet Take 1 tablet (100 mcg total) by mouth daily before breakfast. 90 tablet 0   hydrochlorothiazide  (HYDRODIURIL ) 12.5 MG tablet TAKE 1 TABLET(12.5 MG) BY MOUTH DAILY (Patient not taking: Reported on 10/25/2023) 30 tablet 0   No facility-administered medications prior to visit.    Allergies  Allergen Reactions   Coconut (Cocos Nucifera) Anaphylaxis   Other Shortness Of Breath    Covid booster pfizer     Prednisone  Hypertension    Severe headache   Iodinated Contrast Media Swelling    Pt reported throat swelling post-procedure that resolved with Benadryl  at home   Petrolatum Dermatitis    Caused incision irritation     ROS Review of Systems  Constitutional:  Positive for fatigue. Negative for chills and fever.  HENT:  Negative for congestion, sinus pressure, sinus pain and sore throat.   Eyes:  Negative for pain and discharge.  Respiratory:  Negative for cough and shortness of breath.   Cardiovascular:  Positive for leg swelling. Negative for chest pain and palpitations.  Gastrointestinal:  Negative for abdominal pain, constipation, diarrhea, nausea and vomiting.  Endocrine: Negative for polydipsia and polyuria.  Genitourinary:  Negative for dysuria and hematuria.  Musculoskeletal:  Positive for arthralgias and back pain. Negative for neck pain and neck stiffness.   Skin:  Negative for rash.  Neurological:  Positive for numbness (and tingling of LE). Negative for dizziness and weakness.  Psychiatric/Behavioral:  Negative for agitation and behavioral problems.       Objective:    Physical Exam Vitals reviewed.  Constitutional:      General: She is not in acute distress.    Appearance: She is obese. She is not diaphoretic.  HENT:     Head: Normocephalic and atraumatic.     Nose: Nose normal. No congestion.     Mouth/Throat:     Mouth: Mucous membranes are moist.     Pharynx: No posterior oropharyngeal erythema.  Eyes:     General: No scleral icterus.    Extraocular Movements: Extraocular movements intact.  Cardiovascular:     Rate and Rhythm: Normal rate and regular rhythm.     Heart sounds: Normal heart sounds. No murmur heard. Pulmonary:  Breath sounds: Normal breath sounds. No wheezing or rales.  Musculoskeletal:     Cervical back: Neck supple. No tenderness.     Right lower leg: Edema (2+) present.     Left lower leg: Edema (2+) present.  Skin:    General: Skin is warm.     Findings: No rash.  Neurological:     General: No focal deficit present.     Mental Status: She is alert and oriented to person, place, and time.     Sensory: Sensory deficit (B/l LE) present.     Motor: No weakness.  Psychiatric:        Mood and Affect: Mood normal.        Behavior: Behavior normal.     BP 107/68   Pulse 73   Ht 5' 6 (1.676 m)   Wt 229 lb 12.8 oz (104.2 kg)   SpO2 97%   BMI 37.09 kg/m  Wt Readings from Last 3 Encounters:  12/31/23 229 lb 12.8 oz (104.2 kg)  12/25/23 224 lb (101.6 kg)  10/25/23 230 lb 9.6 oz (104.6 kg)    Lab Results  Component Value Date   TSH 4.900 (H) 09/13/2023   Lab Results  Component Value Date   WBC 6.8 10/16/2023   HGB 14.0 10/16/2023   HCT 43.7 10/16/2023   MCV 81.7 10/16/2023   PLT 192 10/16/2023   Lab Results  Component Value Date   NA 133 (L) 10/16/2023   K 4.1 10/16/2023   CO2 23  10/16/2023   GLUCOSE 104 (H) 10/16/2023   BUN 21 10/16/2023   CREATININE 1.03 (H) 10/16/2023   BILITOT 0.6 10/16/2023   ALKPHOS 92 10/16/2023   AST 18 10/16/2023   ALT 15 10/16/2023   PROT 7.4 10/16/2023   ALBUMIN 3.6 10/16/2023   CALCIUM 9.5 10/16/2023   ANIONGAP 8 10/16/2023   EGFR 84 05/03/2023   Lab Results  Component Value Date   CHOL 202 (H) 09/13/2023   Lab Results  Component Value Date   HDL 56 09/13/2023   Lab Results  Component Value Date   LDLCALC 137 (H) 09/13/2023   Lab Results  Component Value Date   TRIG 51 09/13/2023   Lab Results  Component Value Date   CHOLHDL 3.6 09/13/2023   Lab Results  Component Value Date   HGBA1C 6.4 10/09/2023      Assessment & Plan:   Problem List Items Addressed This Visit       Respiratory   OSA (obstructive sleep apnea) - Primary   STOP-BANG: 5 Home sleep study showed moderate OSA Has CPAP device [facility] pharmacy Has tried nasal mask, but still has difficulty breathing - has been evaluated by Sleep specialist She can be a good candidate for Zepbound  - recent SURMOUNT trial showed significant improvement in AHI score with moderate OSA Had lengthy discussion of benefits and side effects of Zepbound , patient is interested in starting Zepbound  - prescribed Zepbound  2.5 mg QW, plan to increase dose as tolerated      Relevant Medications   tirzepatide  (ZEPBOUND ) 2.5 MG/0.5ML Pen     Digestive   GERD (gastroesophageal reflux disease)   Well controlled with diet modification now -takes pantoprazole  40 mg QD as needed        Endocrine   Hypothyroidism following radioiodine therapy   Lab Results  Component Value Date   TSH 4.900 (H) 09/13/2023   On Levothyroxine  100 mcg QD, f/u with Dr Lenis  Other   Prediabetes   Lab Results  Component Value Date   HGBA1C 6.4 10/09/2023   Advised to follow low carb diet for now On metformin  500 mg QD, prescribed by Dr. Lenis      Relevant Orders    CMP14+EGFR   Hemoglobin A1c   Leg swelling   Likely related to venous insufficiency and/or lymphedema Echo in 2021 showed LVEF of 60-65%, planned to get repeat Echo Leg elevation and compression socks Referred to PT, but had groin pain Uses compression device at home, it helps - uses it twice a week, needs to use it at least 3 times in a week Had hypotension with even low-dose diuretics          Meds ordered this encounter  Medications   tirzepatide  (ZEPBOUND ) 2.5 MG/0.5ML Pen    Sig: Inject 2.5 mg into the skin once a week.    Dispense:  2 mL    Refill:  0    Follow-up: Return in about 3 months (around 04/01/2024) for OSA.    Kara MARLA Blanch, MD

## 2023-12-31 NOTE — Assessment & Plan Note (Addendum)
 STOP-BANG: 5 Home sleep study showed moderate OSA Has CPAP device through [facility]' pharmacy Has tried nasal mask, but still has difficulty breathing - has been evaluated by Sleep specialist She can be a good candidate for Zepbound  - recent SURMOUNT trial showed significant improvement in AHI score with moderate OSA Had lengthy discussion of benefits and side effects of Zepbound , patient is interested in starting Zepbound  - prescribed Zepbound  2.5 mg QW, plan to increase dose as tolerated

## 2023-12-31 NOTE — Patient Instructions (Addendum)
 Please start taking Zepbound  as prescribed.  Please follow small, frequent meals and follow low carb diet.  Please continue to take medications as prescribed.  Please continue to follow low carb diet and perform moderate exercise/walking at least 150 mins/week.  Please get fasting blood tests done before the next visit.

## 2023-12-31 NOTE — Assessment & Plan Note (Signed)
 Lab Results  Component Value Date   TSH 4.900 (H) 09/13/2023   On Levothyroxine  100 mcg QD, f/u with Dr Lenis

## 2023-12-31 NOTE — Assessment & Plan Note (Addendum)
 Lab Results  Component Value Date   HGBA1C 6.4 10/09/2023   Advised to follow low carb diet for now On metformin  500 mg QD, prescribed by Dr. Nida

## 2023-12-31 NOTE — Assessment & Plan Note (Signed)
 Well controlled with diet modification now -takes pantoprazole  40 mg QD as needed

## 2023-12-31 NOTE — Assessment & Plan Note (Signed)
 Likely related to venous insufficiency and/or lymphedema Echo in 2021 showed LVEF of 60-65%, planned to get repeat Echo Leg elevation and compression socks Referred to PT, but had groin pain Uses compression device at home, it helps - uses it twice a week, needs to use it at least 3 times in a week Had hypotension with even low-dose diuretics

## 2024-01-01 ENCOUNTER — Ambulatory Visit (HOSPITAL_COMMUNITY)
Admission: RE | Admit: 2024-01-01 | Discharge: 2024-01-01 | Disposition: A | Discharge status: Home / Self Care | Source: Ambulatory Visit | Attending: Internal Medicine | Admitting: Internal Medicine

## 2024-01-01 ENCOUNTER — Ambulatory Visit: Payer: Self-pay | Admitting: Internal Medicine

## 2024-01-01 DIAGNOSIS — M85852 Other specified disorders of bone density and structure, left thigh: Secondary | ICD-10-CM | POA: Diagnosis not present

## 2024-01-01 DIAGNOSIS — Z780 Asymptomatic menopausal state: Secondary | ICD-10-CM | POA: Insufficient documentation

## 2024-01-03 ENCOUNTER — Ambulatory Visit: Payer: Self-pay | Admitting: Cardiology

## 2024-01-03 ENCOUNTER — Ambulatory Visit (HOSPITAL_COMMUNITY)
Admission: RE | Admit: 2024-01-03 | Discharge: 2024-01-03 | Disposition: A | Discharge status: Home / Self Care | Source: Ambulatory Visit | Attending: Cardiology | Admitting: Cardiology

## 2024-01-03 DIAGNOSIS — R072 Precordial pain: Secondary | ICD-10-CM | POA: Diagnosis not present

## 2024-01-03 LAB — ECHOCARDIOGRAM COMPLETE
Area-P 1/2: 3.53 cm2
S' Lateral: 2.7 cm

## 2024-01-07 ENCOUNTER — Encounter: Payer: Self-pay | Admitting: Internal Medicine

## 2024-01-08 ENCOUNTER — Encounter (HOSPITAL_COMMUNITY): Payer: Self-pay

## 2024-01-08 ENCOUNTER — Telehealth: Payer: Self-pay | Admitting: Pharmacist

## 2024-01-08 ENCOUNTER — Ambulatory Visit (HOSPITAL_COMMUNITY)

## 2024-01-08 ENCOUNTER — Telehealth: Payer: Self-pay | Admitting: Pharmacy Technician

## 2024-01-08 ENCOUNTER — Other Ambulatory Visit (HOSPITAL_COMMUNITY): Payer: Self-pay

## 2024-01-08 DIAGNOSIS — M25361 Other instability, right knee: Secondary | ICD-10-CM | POA: Diagnosis not present

## 2024-01-08 DIAGNOSIS — R262 Difficulty in walking, not elsewhere classified: Secondary | ICD-10-CM | POA: Diagnosis not present

## 2024-01-08 DIAGNOSIS — M25561 Pain in right knee: Secondary | ICD-10-CM

## 2024-01-08 DIAGNOSIS — Z7409 Other reduced mobility: Secondary | ICD-10-CM

## 2024-01-08 DIAGNOSIS — G8929 Other chronic pain: Secondary | ICD-10-CM | POA: Diagnosis not present

## 2024-01-08 DIAGNOSIS — T148XXA Other injury of unspecified body region, initial encounter: Secondary | ICD-10-CM | POA: Diagnosis not present

## 2024-01-08 NOTE — Telephone Encounter (Signed)
 An E-Appeal has been submitted for Zepbound . Will advise when response is received, please be advised that most companies may take 30 days to make a decision. Appeal letter and supporting documentation were uploaded and submitted via CMM on 01/08/2024 @3 :09 pm.  Thank you, Devere Pandy, PharmD Clinical Pharmacist  [place]  Direct Dial: [phone]

## 2024-01-08 NOTE — Telephone Encounter (Signed)
 PA request has been Started. New Encounter has been or will be created for follow up. For additional info see Pharmacy Prior Auth telephone encounter from 01/08/2024.

## 2024-01-08 NOTE — Telephone Encounter (Signed)
 Pharmacy Patient Advocate Encounter   Received notification from Patient Advice Request messages that prior authorization for Zepbound  2.5mg /0.40ml auto-injectors is required/requested.   Insurance verification completed.   The patient is insured through [facility] MEDICARE .   Per test claim: PA required; PA submitted to above mentioned insurance via Latent Key/confirmation #/EOC [id number] Status is pending

## 2024-01-08 NOTE — Therapy (Signed)
 OUTPATIENT PHYSICAL THERAPY LOWER EXTREMITY TREATMENT   Patient Name: Kara [name] MRN: [id number] DOB:03-Apr-1946, 78 y.o., female Today's Date: 01/08/2024  END OF SESSION:  PT End of Session - 01/08/24 1110     Visit Number 2    Number of Visits 8    Date for PT Re-Evaluation 01/25/24    Authorization Type medicare/ BCBS secondary    Progress Note Due on Visit 10    PT Start Time 1110    PT Stop Time 1153    PT Time Calculation (min) 43 min    Activity Tolerance Patient tolerated treatment well    Behavior During Therapy [facility] for tasks assessed/performed           Past Medical History:  Diagnosis Date   Allergy     food allergies, drug allergies   Anemia    Anxiety    Arthritis    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  The STAT Breast cancer panel offered by [facility] includes sequencing and rearrangement analysis for the following 9 genes:  ATM, BRCA1, BRCA2, CDH1, CHEK2, PALB2, PTEN, STK11 and TP53.   The report date is August 09, 2019.   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 fibroids   BIOPSY  05/02/2022   Procedure: BIOPSY;  Surgeon: Cindie Carlin POUR, DO;  [facility];  Service: Endoscopy;;   BREAST BIOPSY Left 05/2019   left breast DCIS   BREAST LUMPECTOMY Left 06/2019   DCIS   BREAST LUMPECTOMY WITH RADIOACTIVE SEED LOCALIZATION Left 07/08/2019   Procedure: LEFT BREAST LUMPECTOMY WITH RADIOACTIVE SEED LOCALIZATION;  Surgeon: Curvin Deward MOULD, MD;  Location: [facility];  Service: General;  Laterality: Left;   BREAST SURGERY N/A    Phreesia 08/30/2020   CESAREAN SECTION     CESAREAN SECTION N/A    Phreesia 08/30/2020   CHOLECYSTECTOMY     COLONOSCOPY  2011   [place] : two 3-4 mm sessile polyps, hyperplastic, sigmoid diverticula, TI appeared normal.    COLONOSCOPY WITH PROPOFOL  N/A 05/02/2022   Procedure: COLONOSCOPY WITH PROPOFOL ;  Surgeon: Cindie Carlin POUR, DO;  [facility];  Service: Endoscopy;  Laterality: N/A;  11:30 am   ESOPHAGOGASTRODUODENOSCOPY  2011   [place] : non-bleeding erosive gastropathy, normal duodenum. path: unremarkable duodenum, negative H.pylori, minimal esophagitis    ESOPHAGOGASTRODUODENOSCOPY N/A 11/23/2022   Procedure: ESOPHAGOGASTRODUODENOSCOPY (EGD);  Surgeon: Wilhelmenia Aloha Raddle., MD;  Location: [facility];  Service: Gastroenterology;  Laterality: N/A;   ESOPHAGOGASTRODUODENOSCOPY (EGD) WITH PROPOFOL  N/A 05/02/2022   Procedure: ESOPHAGOGASTRODUODENOSCOPY (EGD) WITH PROPOFOL ;  Surgeon: Cindie Carlin POUR, DO;  [facility];  Service: Endoscopy;  Laterality: N/A;   EUS N/A 11/23/2022    Procedure: UPPER ENDOSCOPIC ULTRASOUND (EUS) RADIAL;  Surgeon: Wilhelmenia Aloha Raddle., MD;  Location: [facility];  Service: Gastroenterology;  Laterality: N/A;   EYE SURGERY  2022   IR RADIOLOGIST EVAL & MGMT  06/20/2023   POLYPECTOMY  05/02/2022   Procedure: POLYPECTOMY;  Surgeon: Cindie Carlin POUR, DO;  [facility];  Service: Endoscopy;;   REFRACTIVE SURGERY  2024   TONSILLECTOMY  1975   Patient Active Problem List   Diagnosis Date Noted   History of anaphylaxis 11/05/2023   Class 2 severe obesity due to excess calories with serious comorbidity and body mass index (BMI) of 35.0 to 35.9 in adult (HCC) 07/10/2023   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   Obesity (BMI 30-39.9) 05/24/2023   OSA (obstructive sleep apnea) 07/21/2022   H/o Lyme disease 07/21/2022   Hypotension 07/03/2022   Idiopathic peripheral neuropathy 04/12/2022   RUQ pain 04/05/2022   Encounter for screening colonoscopy 04/05/2022   Leg swelling 05/25/2021   Vitamin D  deficiency 03/11/2021   Plantar fasciitis of right foot 10/13/2020   Trigger middle finger of right hand 10/13/2020   Intermittent palpitations 04/29/2020   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   Shortness of breath 02/04/2020   COVID-19 01/14/2020   Trochanteric bursitis of left hip 12/30/2019   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   Hypothyroidism following radioiodine therapy 03/06/2019   Prediabetes 08/30/2017   Mixed hyperlipidemia 08/23/2016   GERD (gastroesophageal reflux disease) 08/14/2016    PCP: Tobie Suzzane POUR, MD  REFERRING PROVIDER: Onesimo Oneil LABOR, MD  REFERRING DIAG: [phone] (ICD-10-CM) - Chronic pain of right knee T14.8XXA (ICD-10-CM) - Bone bruise M25.361 (ICD-10-CM) - Instability of knee joint, right  THERAPY DIAG:  Right knee pain, unspecified chronicity  Difficulty in walking, not elsewhere classified  Impaired functional mobility, balance, gait, and  endurance  Rationale for Evaluation and Treatment: Rehabilitation  ONSET DATE: pain since Nov of 2024  SUBJECTIVE:   SUBJECTIVE STATEMENT: Pt states she is in 5/10 pain upon presentation. Pt states she figured out her compression stockings were adding to her knee pain with added pressure so she switched to compression socks. Pt states she used to be a member of the [employer] but stopped due to the knee pain. Pt states her main goals are to walk again and to be ablE to ambulate stairs.  Right knee pain started hurting in Nov; was told she had an infection per MRI top of tibia but was not given antibiotic; was told to stay off the knee so she has periodically been using RW or cane off and on.  Has affected the way she walks and this makes her back hurt.  She is favoring that side and sometimes that right knee will give way.  Has seen Dr Onesimo 3 times; given an injection on first visit and that did not help at all.  Also had  a second opinion in [place]  but has not heard back from them.  That MD wanted her to have a visco supplementation injection.  Referred to therapy.    PERTINENT HISTORY: Lymphedema bilateral Lower extremities  PAIN:  Are you having pain? Yes: NPRS scale: 0/10; 7/10 at worst Pain location: inside and across the front Pain description: gives way Aggravating factors: gives way Relieving factors: rest; ice  PRECAUTIONS: None  RED FLAGS: None   WEIGHT BEARING RESTRICTIONS: No  FALLS:  Has patient fallen in last 6 months? No  OCCUPATION: retired  PLOF: Independent  PATIENT GOALS: get more strength back in my legs and be more stable; be able to walk more; walk again  NEXT MD VISIT: Sept  OBJECTIVE:  Note: Objective measures were completed at Evaluation unless otherwise noted.  DIAGNOSTIC FINDINGS:   CLINICAL DATA:  Meniscal injury, knee   Right knee instability for 3 months. No known injury or prior relevant surgery.   EXAM: MRI OF THE RIGHT KNEE WITHOUT  CONTRAST   TECHNIQUE: Multiplanar, multisequence MR imaging of the knee was performed. No intravenous contrast was administered.   COMPARISON:  Radiographs 02/13/2023.  MRI 08/16/2019.   FINDINGS: MENISCI   Medial meniscus:  Intact with normal morphology.   Lateral meniscus: On the previous MRI, there was an apparent large parameniscal cyst peripherally which is no longer present. The lateral meniscus appears degenerated with mild free edge fraying, although no discrete meniscal tear or displaced meniscal fragment is demonstrated.   LIGAMENTS   Cruciates: The anterior and posterior cruciate ligaments are intact.   Collaterals: The medial and lateral collateral ligament complexes are intact.   CARTILAGE   Patellofemoral:  Preserved.   Medial: Mild to moderate chondral thinning and surface irregularity. No full-thickness chondral defect.   Lateral: Mild to moderate chondral thinning and surface irregularity. No full-thickness chondral defect.   MISCELLANEOUS   Joint:  Small joint effusion.   Popliteal Fossa: The popliteus muscle and tendon are intact. Small Baker's cyst.   Extensor Mechanism: Stable mild proximal patellar tendinosis. The quadriceps tendon appears normal. The patellar retinacula are intact.   Bones: New prominent marrow edema throughout the lateral tibial plateau, greatest posteriorly. Questionable underlying subchondral insufficiency fracture, best seen on the sagittal images. Mild surrounding soft tissue edema. No other significant osseous abnormalities are identified.   Other: No other significant periarticular soft tissue findings.   IMPRESSION: 1. New prominent marrow edema throughout the lateral tibial plateau, greatest posteriorly, with questionable underlying subchondral insufficiency fracture. 2. The lateral meniscus appears degenerated with mild free edge fraying, although no discrete meniscal tear or displaced meniscal fragment is  demonstrated. No residual parameniscal cyst is seen. 3. The medial meniscus, cruciate and collateral ligaments are intact. 4. 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 5. Small joint effusion and small Baker's cyst.     Electronically Signed   By: Elsie Perone M.D.   On: 07/19/2023 15:27  PATIENT SURVEYS:  LEFS  Extreme difficulty/unable (0), Quite a bit of difficulty (1), Moderate difficulty (2), Little difficulty (3), No difficulty (4) Survey date:    Any of your usual work, housework or school activities   2. Usual hobbies, recreational or sporting activities   3. Getting into/out of the bath   4. Walking between rooms   5. Putting on socks/shoes   6. Squatting    7. Lifting an object, like a bag of groceries from the floor   8. Performing light activities around your  home   9. Performing heavy activities around your home   10. Getting into/out of a car   11. Walking 2 blocks   12. Walking 1 mile   13. Going up/down 10 stairs (1 flight)   14. Standing for 1 hour   15.  sitting for 1 hour   16. Running on even ground   17. Running on uneven ground   18. Making sharp turns while running fast   19. Hopping    20. Rolling over in bed   Score total:  50/80; 62.5%     COGNITION: Overall cognitive status: Within functional limits for tasks assessed     SENSATION: None currently  EDEMA:  Both legs swell; lymphedema both legs   POSTURE: rounded shoulders and forward head  PALPATION: General soreness  LOWER EXTREMITY MMTS  MMTs Right eval Left eval  Hip flexion 3+ 4+  Hip extension    Hip abduction    Hip adduction    Hip internal rotation    Hip external rotation    Knee flexion    Knee extension 4- 4+  Ankle dorsiflexion 4- 4-  Ankle plantarflexion    Ankle inversion    Ankle eversion     (Blank rows = not tested)  LOWER EXTREMITY ROM  ROM Right eval Left eval  Hip flexion    Hip extension    Hip abduction     Hip adduction    Hip internal rotation    Hip external rotation    Knee flexion 105   Knee extension -5 0  Ankle dorsiflexion    Ankle plantarflexion    Ankle inversion    Ankle eversion     (Blank rows = not tested)    FUNCTIONAL TESTS:  5 times sit to stand: 17.34  sec hands on knees Timed up and go (TUG): next visit 2 minute walk test: next visit SLS right 5 and left 1  GAIT: Distance walked: 60 ft in clinic Assistive device utilized: None Level of assistance: Complete Independence Comments: slow gait speed; cautious                                                                                                                                TREATMENT DATE:  01/08/2024  Therapeutic Exercise: -Stationary Bike, 5 minutes, level 3 resistance -Seated hamstring curl, 3 sets of 10 reps, plate 4>plate 5, pt cued for eccentric control with RLE -Leg press, 3 sets of 10 reps, palte 3>4>5, pt cued for eccentric control -Lateral stepping 2 laps 10 steps per lap, with RTB around ankles, pt cued for slow/controlled movement  -Monster walks, RTB at ankles, 2 laps 10 steps each direction, pt cued for sequencing   12/28/23 physical therapy evaluation and HEP instruction    PATIENT EDUCATION:  Education details: Patient educated on exam findings, POC, scope of PT, HEP, and what to expect next visit. Person educated: Patient Education method: Explanation,  Demonstration, and Handouts Education comprehension: verbalized understanding, returned demonstration, verbal cues required, and tactile cues required  HOME EXERCISE PROGRAM: Access Code: [email or web address] URL: [email or web address] Date: 12/28/2023 Prepared by: [facility]  Exercises - Supine Quad Set  - 2 x daily - 7 x weekly - 1 sets - 10 reps - 5 sec hold - Sit to Stand Without Arm Support  - 2 x daily - 7 x weekly - 1 sets - 10 reps - standing single leg balance at the counter (try to not hold on)  - 2 x daily - 7 x  weekly - 1 sets - 5 reps - 30 sec hold  Access Code: [email or web address] URL: [email or web address] Date: 01/08/2024 Prepared by: Lang Ada  Exercises - Side Stepping with Resistance at Ankles  - 1 x daily - 7 x weekly - 2 sets - 10 reps - Band Walks  - 1 x daily - 7 x weekly - 2 sets - 10 reps  ASSESSMENT:  CLINICAL IMPRESSION: Patient continues to demonstrate right knee discomfort, decreased RLE strength, decreased gait quality and balance. Patient also demonstrates good endurance with aerobic based exercise during today's session on stationary bike. Patient able to progress dynamic balance and knee activation exercises today with resisted walking and knee dominant strengthening machines, good performance with verbal cueing. Patient would continue to benefit from skilled physical therapy for decreased pain in right knee, increased endurance with ambulation, increased LE strength, and improved balance for improved quality of life, improved gait quality, stair ambulation, and continued progress towards therapy goals.   Patient is a 78 y.o. female who was seen today for physical therapy evaluation and treatment for M25.561,G89.29 (ICD-10-CM) - Chronic pain of right knee T14.8XXA (ICD-10-CM) - Bone bruise M25.361 (ICD-10-CM) - Instability of knee joint, right. Patient demonstrates muscle weakness, reduced ROM, and fascial restrictions which are likely contributing to symptoms of pain and are negatively impacting patient ability to perform ADLs and functional mobility tasks. Patient will benefit from skilled physical therapy services to address these deficits to reduce pain and improve level of function with ADLs and functional mobility tasks.   OBJECTIVE IMPAIRMENTS: Abnormal gait, decreased activity tolerance, decreased balance, decreased mobility, difficulty walking, decreased ROM, decreased strength, increased edema, increased fascial restrictions, impaired perceived functional ability, and  pain.   ACTIVITY LIMITATIONS: standing, squatting, transfers, and locomotion level  PARTICIPATION LIMITATIONS: meal prep and shopping  REHAB POTENTIAL: Good  CLINICAL DECISION MAKING: Evolving/moderate complexity  EVALUATION COMPLEXITY: Moderate   GOALS: Goals reviewed with patient? Yes  SHORT TERM GOALS: Target date: 01/11/2024 patient will be independent with initial HEP  Baseline: Goal status: MET  2.  Patient will report 30% improvement overall  Baseline:  Goal status: INITIAL   LONG TERM GOALS: Target date: 01/25/2024  Patient will be independent in self management strategies to improve quality of life and functional outcomes.  Baseline:  Goal status: INITIAL  2.  Patient will report 50% improvement overall  Baseline:  Goal status: INITIAL  3.  Patient will able to stand SLS on each leg x 10 to demonstrate improved functional balance  Baseline:  Goal status: INITIAL  4.  Patient will improve 5 times sit to stand score to 15 sec or less to demonstrate improved functional mobility and increased leg strength.    Baseline: 17.34 Goal status: INITIAL  5.  Patient will increase knee mobility to 0 to 120 to promote normal navigation of steps; step over step pattern  Baseline:  Goal status: INITIAL  6.   Patient will increase right leg MMT's to 5/5 to allow navigation of steps without gait deviation or loss of balance  Baseline:  Goal status: INITIAL   PLAN:  PT FREQUENCY: 2x/week  PT DURATION: 4 weeks  PLANNED INTERVENTIONS: 97164- PT Re-evaluation, 97110-Therapeutic exercises, 97530- Therapeutic activity, 97112- Neuromuscular re-education, 97535- Self Care, 02859- Manual therapy, [id number]- Gait training, [phone]- Orthotic Fit/training, [phone]- Canalith repositioning, [id number]- Aquatic Therapy, 97760- Splinting, [id number]- Wound care (first 20 sq cm), 97598- Wound care (each additional 20 sq cm)Patient/Family education, Balance training, Stair training, Taping,  Dry Needling, Joint mobilization, Joint manipulation, Spinal manipulation, Spinal mobilization, Scar mobilization, and DME instructions.   PLAN FOR NEXT SESSION: Review HEP and goals; right knee mobility and strength; balance  Lang Ada, PT, DPT [facility] Office: [phone] 12:06 PM, 01/08/24

## 2024-01-08 NOTE — Telephone Encounter (Signed)
 Pharmacy Patient Advocate Encounter  Received notification from [facility] that Prior Authorization for Zepbound  2.5MG /0.5ML pen-injectors has been DENIED.  Full denial letter will be uploaded to the media tab. See denial reason below.   PA #/Case ID/Reference #: [id number]  Our appeals pharmacist will file an appeal for indication of OSA.

## 2024-01-09 ENCOUNTER — Encounter: Payer: Self-pay | Admitting: Internal Medicine

## 2024-01-09 ENCOUNTER — Ambulatory Visit: Admitting: Internal Medicine

## 2024-01-09 ENCOUNTER — Other Ambulatory Visit (HOSPITAL_COMMUNITY): Payer: Self-pay

## 2024-01-09 VITALS — BP 124/79 | HR 80 | Temp 97.1°F | Ht 66.0 in | Wt 228.8 lb

## 2024-01-09 DIAGNOSIS — K7689 Other specified diseases of liver: Secondary | ICD-10-CM | POA: Diagnosis not present

## 2024-01-09 DIAGNOSIS — R1011 Right upper quadrant pain: Secondary | ICD-10-CM

## 2024-01-09 DIAGNOSIS — Z860101 Personal history of adenomatous and serrated colon polyps: Secondary | ICD-10-CM | POA: Diagnosis not present

## 2024-01-09 DIAGNOSIS — K219 Gastro-esophageal reflux disease without esophagitis: Secondary | ICD-10-CM

## 2024-01-09 MED ORDER — PANTOPRAZOLE SODIUM 40 MG PO TBEC
40.0000 mg | DELAYED_RELEASE_TABLET | Freq: Every day | ORAL | 3 refills | Status: AC
Start: 2024-01-09 — End: 2025-01-08

## 2024-01-09 NOTE — Progress Notes (Signed)
 Referring Provider: Tobie Suzzane POUR, MD Primary Care Physician:  Tobie Suzzane POUR, MD Primary GI:  Dr. Cindie  Chief Complaint  Patient presents with   Follow-up    Patient here today for a follow up on RUQ pain, liver cyst, elevated alk phos, hepatic steatosis, endocrine pancreatic insuffiencey. History of adenomatous colon polyps.    HPI:   Kara [name] is a 78 y.o. female who presents to clinic today for follow up visit. She has a history of abdominal pain, GERD, adenomatous colon polyps.  Extensive workup for her chronic right upper quadrant abdominal pain and chronically elevated alkaline phosphatase level as below:  EGD 04/2022: -2cm hh -mild Schatzki ring -gastritis, reactive gastropathy, no H.pylori -congested ampulla, mild acute duodenitis   Colonoscopy 04/2022: -diverticulosis -five polyps removed, tubular adenomas -Recall 3 years  EUS 11/23/2022 -Normal CBD, no choledocho, normal ampulla, normal pancreatic duct, ?fatty pancreas, multiple liver cysts.  CT abdomen pelvis with contrast 08/24/2022 mild pelvic lymphadenopathy bilateral iliac regions consider follow-up CT versus PET/CT or tissue sampling.  Followed up with her oncologist who recommended repeat CT in 3 months  CT chest abdomen pelvis with contrast 12/07/2022 overall unremarkable, lymphadenopathy resolved.  Multiple liver cysts, largest 5 cm  Elevated alk phos: GGT elevated at 74, lipase 29, AMA negative  She has had chronic intermittent right upper quadrant pain off and on for years.  Worse with certain foods.  Sore to touch, burning in quality.  Pepcid helped a little previously.  Gallbladder removed remotely for this pain but did not make it any better.  Took pantoprazole  without improvement.  Pain sometimes sharp, sometimes pressure.  Occurs every day  Also notes chronically loose stools, 1-2 bowel movements per day.  Has not had a formed stool in over a year.  Was given samples of pancreatic  enzymes on previous visit.  She states these helped though she has a very difficult time taking medications  Underwent IR drainage of liver cyst 07/18/2023 as well as liver biopsy.  Liver pathology: Liver with focal minimal parenchymal inflammation with rare foamy histiocytes, see note.  Note: It is noted that the patient has had a persistent mildly elevated alkaline phosphatase as well as a GGT.  Overall there is very minimal liver findings with mild lobular disarray focal minimal parenchymal inflammation with focal histiocytes and very focal minimal bile duct reactive type change (not at all pathognomonic).  There is minimal steatosis (<5%).  There is no fibrosis.  There is no stainable iron. PAS stain is unremarkable.  A reticulin stain shows normal plate thickness.  Overall, there is no evidence of chronic hepatitis or steatohepatitis in the above findings are very mild and nonspecific, in such circumstances a mild drug-induced liver injury (DILI) may be considered but is also not pathognomonic.   Today, she states overall her right upper quadrant abdominal pain is improved.  Prior to drainage if her pain level was a 10, it is currently a 3-4.  She is happy with her results.  Pain is still present though she can lay on her right side now.   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  The STAT Breast cancer panel offered by  [facility] includes sequencing and rearrangement analysis for the following 9 genes:  ATM, BRCA1, BRCA2, CDH1, CHEK2, PALB2, PTEN, STK11 and TP53.   The report date is August 09, 2019.   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 fibroids   BIOPSY  05/02/2022   Procedure: BIOPSY;  Surgeon: Cindie Carlin POUR, DO;  Location: [facility];  Service: Endoscopy;;   BREAST BIOPSY Left 05/2019   left breast DCIS   BREAST LUMPECTOMY Left 06/2019   DCIS   BREAST LUMPECTOMY WITH RADIOACTIVE SEED LOCALIZATION Left 07/08/2019   Procedure: LEFT BREAST LUMPECTOMY WITH RADIOACTIVE SEED LOCALIZATION;  Surgeon: Curvin Deward MOULD, MD;  Location: [facility];  Service: General;  Laterality: Left;   BREAST SURGERY N/A    [employer]reesia 08/30/2020   CESAREAN SECTION     CESAREAN SECTION N/A    [employer] 08/30/2020   CHOLECYSTECTOMY     COLONOSCOPY  2011   [place] : two 3-4 mm sessile polyps, hyperplastic, sigmoid diverticula, TI appeared normal.    COLONOSCOPY WITH PROPOFOL  N/A 05/02/2022   Procedure: COLONOSCOPY WITH PROPOFOL ;  Surgeon: Cindie Carlin POUR, DO;  Location: [facility];  Service: Endoscopy;  Laterality: N/A;  11:30 am   ESOPHAGOGASTRODUODENOSCOPY  2011   [place] : non-bleeding erosive gastropathy, normal duodenum. path: unremarkable duodenum, negative H.pylori, minimal esophagitis    ESOPHAGOGASTRODUODENOSCOPY N/A 11/23/2022   Procedure: ESOPHAGOGASTRODUODENOSCOPY (EGD);  Surgeon: Wilhelmenia Aloha Raddle., MD;  Location: [facility];  Service: Gastroenterology;  Laterality: N/A;   ESOPHAGOGASTRODUODENOSCOPY (EGD) WITH PROPOFOL  N/A 05/02/2022   Procedure: ESOPHAGOGASTRODUODENOSCOPY (EGD) WITH PROPOFOL ;  Surgeon: Cindie Carlin POUR, DO;  Location: [facility];  Service: Endoscopy;  Laterality: N/A;   EUS N/A 11/23/2022   Procedure: UPPER ENDOSCOPIC ULTRASOUND (EUS) RADIAL;  Surgeon: Wilhelmenia Aloha Raddle., MD;  Location: [facility];  Service: Gastroenterology;  Laterality: N/A;   EYE SURGERY  2022   IR RADIOLOGIST EVAL & MGMT  06/20/2023   POLYPECTOMY  05/02/2022   Procedure: POLYPECTOMY;  Surgeon: Cindie Carlin POUR, DO;  Location: [facility];  Service: Endoscopy;;   REFRACTIVE SURGERY  2024   TONSILLECTOMY  1975    Current Outpatient Medications  Medication Sig Dispense Refill   acetaminophen  (TYLENOL ) 500 MG tablet Take  500 mg by mouth every 8 (eight) hours as needed for moderate pain (pain score 4-6).     albuterol  (VENTOLIN  HFA) 108 (90 Base) MCG/ACT inhaler Inhale 1 puff into the lungs every 6 (six) hours as needed for wheezing or shortness of breath. 18 g 2   Carboxymethylcell-Glycerin PF (REFRESH RELIEVA PF) 0.5-1 % SOLN Place 1 drop into both eyes in the morning, at noon, in the evening, and at bedtime.     EPINEPHrine  0.3 mg/0.3 mL IJ SOAJ injection Inject 0.3 mg into the muscle as needed for anaphylaxis. 1 each 2   ibuprofen  (ADVIL ) 100 MG/5ML suspension Take 200 mg by mouth every 4 (four) hours as needed.     latanoprost (XALATAN) 0.005 % ophthalmic solution Place 1 drop into both eyes at bedtime.     Misc. Devices MISC Compression  stockings XL: 20-30 mm Hg 1 each 0   Multiple Vitamin (MULTIVITAMIN) capsule Take 1 capsule by mouth daily. (Patient taking differently: Take 1 capsule by mouth daily. occasionally)     pantoprazole  (PROTONIX ) 40 MG tablet Take 1 tablet (40 mg total) by mouth daily. (Patient taking differently: Take 40 mg by mouth as needed.) 30 tablet 0   SYNTHROID  100 MCG tablet TAKE 1 TABLET(100 MCG) BY MOUTH DAILY BEFORE BREAKFAST 90 tablet 0   Thiamine  HCl (VITAMIN B-1 PO) Take 1 tablet by mouth in the morning.     tirzepatide  (ZEPBOUND ) 2.5 MG/0.5ML Pen Inject 2.5 mg into the skin once a week. (Patient not taking: Reported on 01/09/2024) 2 mL 0   No current facility-administered medications for this visit.    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   Other Shortness Of Breath 01/20/2011   Prednisone  Hypertension 12/03/2018   Iodinated contrast media Swelling 12/04/2022   Petrolatum Dermatitis 04/28/2022    Family History  Problem Relation Age of Onset   Breast cancer Maternal Grandmother        dx over 37   Hyperlipidemia Maternal Grandmother    Prostate cancer Maternal Grandfather    Arthritis Mother    COPD Mother    Breast cancer Mother 37       second at age 56   Hearing loss Mother    Cancer Father        throat   Stroke Father    Cancer Maternal Aunt 102       [name], cervical cancer   Breast cancer Maternal Aunt        dx over 68   Breast cancer Paternal Aunt        dx under 50   Prostate cancer Maternal Uncle        dx over 80   Cancer Other        father's maternal cousin was Henreietta Lacks, Cervical   Cancer Maternal Uncle        unknown cancer   Breast cancer Paternal Aunt        dx over 40   Colon cancer Neg Hx    Allergic rhinitis Neg Hx    Angioedema Neg Hx    Asthma Neg Hx    Atopy Neg Hx    Eczema Neg Hx    Urticaria Neg Hx    Immunodeficiency Neg Hx      Social History   Socioeconomic History   Marital status: Divorced    Spouse name: Not on file   Number of children:  2   Years of education: 15   Highest education level: Some college, no degree  Occupational History   Occupation: retired    Comment: Loss adjuster, chartered at a [employer]  Tobacco Use   Smoking status: Former    Current packs/day: 0.00    Average packs/day: 0.5 packs/day for 15.0 years (7.5 ttl pk-yrs)    Types: Cigarettes    Start date: 06/16/1983    Quit date: 06/15/1998    Years since quitting: 25.5   Smokeless tobacco: Never   Tobacco comments:    quit 2002  Vaping Use   Vaping status: Never Used  Substance and Sexual Activity   Alcohol use: No   Drug use: Never   Sexual activity: Not Currently    Birth control/protection: I.U.D., Pill, Surgical, None    Comment: hyst  Other Topics Concern   Not on file  Social History Narrative   Lives alone   2 grown children- one in [place] and one here   Grandchildren       Moved to [place] from [place] for aging mother who is 21 with Alzheimers      Maternal Aunt is Animator of the [employer] cancer call line      Enjoy: gym, time with friends       Diet: eats all food groups   Caffeine: 2 diet cokes daily    Water : 2-4 cups daily       Wears seat belt    Does not use phone while driving    Smoke and carbon detectors at home   [employer]    No weapons       Right Handed    Lives alone in a one story home    Retired   Social Drivers of Corporate investment [name] Strain: Low Risk  (12/21/2023)   Overall Financial Resource Strain (CARDIA)    Difficulty of Paying Living Expenses: Not hard at all  Food Insecurity: No Food Insecurity (12/21/2023)   Hunger Vital Sign    Worried About Running Out of Food in the Last Year: Never true    Ran Out of Food in the Last Year: Never true  Transportation Needs: No Transportation Needs (12/21/2023)   PRAPARE - Scientist, research (physical sciences) (Medical): No    Lack of Transportation (Non-Medical): No  Physical Activity: Sufficiently Active (12/21/2023)   Exercise Vital Sign    Days of Exercise per Week: 7 days    Minutes of Exercise per Session: 30 min  Stress: No Stress Concern Present (12/21/2023)   [employer] of Occupational Health - Occupational Stress Questionnaire    Feeling of Stress: Not at all  Social Connections: Moderately Isolated (12/21/2023)   Social Connection and Isolation Panel    Frequency of Communication with Friends and Family: More than three times a week    Frequency of Social Gatherings with Friends and Family: More than three times a week    Attends Religious Services: Patient declined    Database administrator or Organizations: Yes    Attends [name] Meetings: 1 to 4 times per year    Marital Status: Divorced    Subjective: Review of Systems  Constitutional:  Negative for chills and fever.  HENT:  Negative for congestion and hearing loss.   Eyes:  Negative for blurred vision and double vision.  Respiratory:  Negative for cough and shortness of breath.   Cardiovascular:  Negative  for chest pain and palpitations.  Gastrointestinal:  Positive for abdominal pain. Negative for blood in stool, constipation, diarrhea, heartburn, melena and vomiting.  Genitourinary:  Negative for dysuria and urgency.  Musculoskeletal:  Negative for joint pain and myalgias.  Skin:  Negative for itching and rash.  Neurological:  Negative for dizziness and headaches.  Psychiatric/Behavioral:  Negative for depression. The patient is not nervous/anxious.      Objective: BP 124/79 (BP Location: Right Arm, Patient Position: Sitting, Cuff Size: Large)   Pulse 80   Temp (!) 97.1 F (36.2 C) (Temporal)   Ht 5' 6 (1.676 m)   Wt 228 lb 12.8 oz (103.8 kg)   BMI 36.93 kg/m  Physical Exam Constitutional:      Appearance: Normal appearance.  HENT:     Head: Normocephalic and atraumatic.   Eyes:     Extraocular Movements: Extraocular movements intact.     Conjunctiva/sclera: Conjunctivae normal.  Cardiovascular:     Rate and Rhythm: Normal rate and regular rhythm.  Pulmonary:     Effort: Pulmonary effort is normal.     Breath sounds: Normal breath sounds.  Abdominal:     General: Bowel sounds are normal.     Palpations: Abdomen is soft.  Musculoskeletal:        General: No swelling. Normal range of motion.     Cervical back: Normal range of motion and neck supple.  Skin:    General: Skin is warm and dry.     Coloration: Skin is not jaundiced.  Neurological:     General: No focal deficit present.     Mental Status: She is alert and oriented to person, place, and time.  Psychiatric:        Mood and Affect: Mood normal.        Behavior: Behavior normal.      Assessment/Plan:  1.  Right upper quadrant abdominal pain-not improved with pantoprazole  or Pepcid.  EUS largely unremarkable.  Noted improvement from recent drainage of her liver cyst.  Discussed that the cyst will likely come back though we at least have our answer as to what could potentially be causing her abdominal pain.  Offered referral to hepatobiliary surgery to discuss more definitive treatment and she would like to continue to monitor for now.  2.  Elevated alk phos-significant prior workup.  Liver biopsy overall with minimal findings besides hepatic steatosis.    Recommend 1-2# weight loss per week until ideal body weight through exercise & diet. Low fat/cholesterol diet.   Avoid sweets, sodas, fruit juices, sweetened beverages like tea, etc. Gradually increase exercise from 15 min daily up to 1 hr per day 5 days/week. Limit alcohol use.  3.  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  Trial of pancreatic enzymes did seem to help though patient states it was difficult for her to remember to take these medications as instructed.  4.   History of adenomatous colon polyps-colonoscopy recall December 2026 if benefits outweigh risk.   Follow-up in 3 months  01/09/2024 9:56 AM   Disclaimer: This note was dictated with voice recognition software. Similar sounding words can inadvertently be transcribed and may not be corrected upon review.

## 2024-01-09 NOTE — Telephone Encounter (Signed)
 The appeal for Zepbound  has been approved through 01/07/2025:    Ran test claim for Zepbound . Currently a quantity of 2 ml is a 28 day supply and the co-pay is $513.51 .  This test claim was processed through [facility]- copay amounts may vary at other pharmacies due to pharmacy/plan contracts, or as the patient moves through the different stages of their insurance plan.

## 2024-01-09 NOTE — Patient Instructions (Addendum)
 I am happy to hear that you are doing well.  Continue on pantoprazole .  I refilled this prescription today.  Follow-up in 3 months.  It is always very nice seeing you.  Dr. Cindie

## 2024-01-09 NOTE — Telephone Encounter (Signed)
 This has been approved from 10/10/2023-01/07/2025. Her copay on the test claim was $513.51. She will need to call her pharmacy for pricing and to let them know if she decides to fill it. She would need to contact her insurance regarding any questions about the copay.

## 2024-01-10 ENCOUNTER — Ambulatory Visit (HOSPITAL_COMMUNITY): Admitting: Physical Therapy

## 2024-01-10 DIAGNOSIS — G8929 Other chronic pain: Secondary | ICD-10-CM | POA: Diagnosis not present

## 2024-01-10 DIAGNOSIS — T148XXA Other injury of unspecified body region, initial encounter: Secondary | ICD-10-CM | POA: Diagnosis not present

## 2024-01-10 DIAGNOSIS — M25561 Pain in right knee: Secondary | ICD-10-CM

## 2024-01-10 DIAGNOSIS — R262 Difficulty in walking, not elsewhere classified: Secondary | ICD-10-CM | POA: Diagnosis not present

## 2024-01-10 DIAGNOSIS — M25361 Other instability, right knee: Secondary | ICD-10-CM | POA: Diagnosis not present

## 2024-01-10 DIAGNOSIS — Z7409 Other reduced mobility: Secondary | ICD-10-CM | POA: Diagnosis not present

## 2024-01-10 NOTE — Therapy (Signed)
 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  The STAT Breast cancer panel offered by [facility] includes sequencing and rearrangement analysis for the following 9 genes:  ATM, BRCA1, BRCA2, CDH1, CHEK2, PALB2, PTEN, STK11 and TP53.   The report date is August 09, 2019.   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 fibroids   BIOPSY   05/02/2022   Procedure: BIOPSY;  Surgeon: Cindie Carlin POUR, DO;  Location: [facility];  Service: Endoscopy;;   BREAST BIOPSY Left 05/2019   left breast DCIS   BREAST LUMPECTOMY Left 06/2019   DCIS   BREAST LUMPECTOMY WITH RADIOACTIVE SEED LOCALIZATION Left 07/08/2019   Procedure: LEFT BREAST LUMPECTOMY WITH RADIOACTIVE SEED LOCALIZATION;  Surgeon: Curvin Deward MOULD, MD;  Location: [facility];  Service: General;  Laterality: Left;   BREAST SURGERY N/A    Phreesia 08/30/2020   CESAREAN SECTION     CESAREAN SECTION N/A    [employer]reesia 08/30/2020   CHOLECYSTECTOMY     COLONOSCOPY  2011   [place] : two 3-4 mm sessile polyps, hyperplastic, sigmoid diverticula, TI appeared normal.    COLONOSCOPY WITH PROPOFOL  N/A 05/02/2022   Procedure: COLONOSCOPY WITH PROPOFOL ;  Surgeon: Cindie Carlin POUR, DO;  Location: [facility];  Service: Endoscopy;  Laterality: N/A;  11:30 am   ESOPHAGOGASTRODUODENOSCOPY  2011   [place] : non-bleeding erosive gastropathy, normal duodenum. path: unremarkable duodenum, negative H.pylori, minimal esophagitis    ESOPHAGOGASTRODUODENOSCOPY N/A 11/23/2022   Procedure: ESOPHAGOGASTRODUODENOSCOPY (EGD);  Surgeon: Wilhelmenia Aloha Raddle., MD;  Location: [facility];  Service: Gastroenterology;  Laterality: N/A;   ESOPHAGOGASTRODUODENOSCOPY (EGD) WITH PROPOFOL  N/A 05/02/2022   Procedure: ESOPHAGOGASTRODUODENOSCOPY (EGD) WITH PROPOFOL ;  Surgeon: Cindie Carlin POUR, DO;  [facility];  Service: Endoscopy;  Laterality: N/A;   EUS  N/A 11/23/2022   Procedure: UPPER ENDOSCOPIC ULTRASOUND (EUS) RADIAL;  Surgeon: Wilhelmenia Aloha Raddle., MD;  Location: [facility];  Service: Gastroenterology;  Laterality: N/A;   EYE SURGERY  2022   IR RADIOLOGIST EVAL & MGMT  06/20/2023   POLYPECTOMY  05/02/2022   Procedure: POLYPECTOMY;  Surgeon: Cindie Carlin POUR, DO;  Location: [facility];  Service: Endoscopy;;   REFRACTIVE SURGERY  2024   TONSILLECTOMY  1975   Patient  Active Problem List   Diagnosis Date Noted   History of anaphylaxis 11/05/2023   Class 2 severe obesity due to excess calories with serious comorbidity and body mass index (BMI) of 35.0 to 35.9 in adult (HCC) 07/10/2023   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   Obesity (BMI 30-39.9) 05/24/2023   OSA (obstructive sleep apnea) 07/21/2022   H/o Lyme disease 07/21/2022   Hypotension 07/03/2022   Idiopathic peripheral neuropathy 04/12/2022   RUQ pain 04/05/2022   Encounter for screening colonoscopy 04/05/2022   Leg swelling 05/25/2021   Vitamin D  deficiency 03/11/2021   Plantar fasciitis of right foot 10/13/2020   Trigger middle finger of right hand 10/13/2020   Intermittent palpitations 04/29/2020   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   Shortness of breath 02/04/2020   COVID-19 01/14/2020   Trochanteric bursitis of left hip 12/30/2019   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   Hypothyroidism following radioiodine therapy 03/06/2019   Prediabetes 08/30/2017   Mixed hyperlipidemia 08/23/2016   GERD (gastroesophageal reflux disease) 08/14/2016    PCP: Tobie Suzzane POUR, MD  REFERRING PROVIDER: Onesimo Oneil LABOR, MD  REFERRING DIAG: [phone] (ICD-10-CM) - Chronic pain of right knee T14.8XXA (ICD-10-CM) - Bone bruise M25.361 (ICD-10-CM) - Instability of knee joint, right  THERAPY DIAG:  Right knee pain, unspecified chronicity  Difficulty in walking, not elsewhere classified  Impaired functional mobility, balance, gait, and endurance  Rationale for Evaluation and Treatment: Rehabilitation  ONSET DATE: pain since Nov of 2024  SUBJECTIVE:   SUBJECTIVE STATEMENT: Pt states she remains in 5/10 Rt knee pain. Has not ordered new stockings yet and not wearing any today. Pt with questions regarding Dry Needling.   Evaluation:  Right knee pain started hurting in Nov; was told she had an infection per MRI top of tibia but was not given antibiotic;  was told to stay off the knee so she has periodically been using RW or cane off and on.  Has affected the way she walks and this makes her back hurt.  She is favoring that side and sometimes that right knee will give way.  Has seen Dr Onesimo 3 times; given an injection on first visit and that did not help at all.  Also had a second opinion in [place]  but has not heard back from them.  That MD wanted her to have a visco supplementation injection.  Referred to therapy.    PERTINENT HISTORY: Lymphedema bilateral Lower extremities  PAIN:  Are you having pain? Yes: NPRS scale: 0/10; 7/10 at worst Pain location: inside and across the front Pain description: gives way Aggravating factors: gives way Relieving factors: rest; ice  PRECAUTIONS: None  RED FLAGS: None   WEIGHT BEARING RESTRICTIONS: No  FALLS:  Has patient fallen in last 6 months? No  OCCUPATION: retired  PLOF: Independent  PATIENT GOALS: get more strength back in my legs and be more stable; be able to walk more; walk again  NEXT MD VISIT: Sept  OBJECTIVE:  Note: Objective measures were  completed at Evaluation unless otherwise noted.  DIAGNOSTIC FINDINGS:   CLINICAL DATA:  Meniscal injury, knee   Right knee instability for 3 months. No known injury or prior relevant surgery.   EXAM: MRI OF THE RIGHT KNEE WITHOUT CONTRAST   TECHNIQUE: Multiplanar, multisequence MR imaging of the knee was performed. No intravenous contrast was administered.   COMPARISON:  Radiographs 02/13/2023.  MRI 08/16/2019.   FINDINGS: MENISCI   Medial meniscus:  Intact with normal morphology.   Lateral meniscus: On the previous MRI, there was an apparent large parameniscal cyst peripherally which is no longer present. The lateral meniscus appears degenerated with mild free edge fraying, although no discrete meniscal tear or displaced meniscal fragment is demonstrated.   LIGAMENTS   Cruciates: The anterior and posterior cruciate  ligaments are intact.   Collaterals: The medial and lateral collateral ligament complexes are intact.   CARTILAGE   Patellofemoral:  Preserved.   Medial: Mild to moderate chondral thinning and surface irregularity. No full-thickness chondral defect.   Lateral: Mild to moderate chondral thinning and surface irregularity. No full-thickness chondral defect.   MISCELLANEOUS   Joint:  Small joint effusion.   Popliteal Fossa: The popliteus muscle and tendon are intact. Small Baker's cyst.   Extensor Mechanism: Stable mild proximal patellar tendinosis. The quadriceps tendon appears normal. The patellar retinacula are intact.   Bones: New prominent marrow edema throughout the lateral tibial plateau, greatest posteriorly. Questionable underlying subchondral insufficiency fracture, best seen on the sagittal images. Mild surrounding soft tissue edema. No other significant osseous abnormalities are identified.   Other: No other significant periarticular soft tissue findings.   IMPRESSION: 1. New prominent marrow edema throughout the lateral tibial plateau, greatest posteriorly, with questionable underlying subchondral insufficiency fracture. 2. The lateral meniscus appears degenerated with mild free edge fraying, although no discrete meniscal tear or displaced meniscal fragment is demonstrated. No residual parameniscal cyst is seen. 3. The medial meniscus, cruciate and collateral ligaments are intact. 4. 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 5. Small joint effusion and small Baker's cyst.     Electronically Signed   By: Elsie Perone M.D.   On: 07/19/2023 15:27  PATIENT SURVEYS:  LEFS  Extreme difficulty/unable (0), Quite a bit of difficulty (1), Moderate difficulty (2), Little difficulty (3), No difficulty (4) Survey date:    Any of your usual work, housework or school activities   2. Usual hobbies, recreational or sporting activities    3. Getting into/out of the bath   4. Walking between rooms   5. Putting on socks/shoes   6. Squatting    7. Lifting an object, like a bag of groceries from the floor   8. Performing light activities around your home   9. Performing heavy activities around your home   10. Getting into/out of a car   11. Walking 2 blocks   12. Walking 1 mile   13. Going up/down 10 stairs (1 flight)   14. Standing for 1 hour   15.  sitting for 1 hour   16. Running on even ground   17. Running on uneven ground   18. Making sharp turns while running fast   19. Hopping    20. Rolling over in bed   Score total:  50/80; 62.5%     COGNITION: Overall cognitive status: Within functional limits for tasks assessed     SENSATION: None currently  EDEMA:  Both legs swell; lymphedema both legs   POSTURE: rounded shoulders and  forward head  PALPATION: General soreness  LOWER EXTREMITY MMTS  MMTs Right eval Left eval  Hip flexion 3+ 4+  Hip extension    Hip abduction    Hip adduction    Hip internal rotation    Hip external rotation    Knee flexion    Knee extension 4- 4+  Ankle dorsiflexion 4- 4-  Ankle plantarflexion    Ankle inversion    Ankle eversion     (Blank rows = not tested)  LOWER EXTREMITY ROM  ROM Right eval Left eval  Hip flexion    Hip extension    Hip abduction    Hip adduction    Hip internal rotation    Hip external rotation    Knee flexion 105   Knee extension -5 0  Ankle dorsiflexion    Ankle plantarflexion    Ankle inversion    Ankle eversion     (Blank rows = not tested)    FUNCTIONAL TESTS:  5 times sit to stand: 17.34  sec hands on knees Timed up and go (TUG): next visit 2 minute walk test: next visit SLS right 5 and left 1  GAIT: Distance walked: 60 ft in clinic Assistive device utilized: None Level of assistance: Complete Independence Comments: slow gait speed; cautious                                                                                                                                 TREATMENT DATE:  01/10/2024  On 20Ft line, Lateral stepping 2RT with GTB around ankles, pt cued for slow/controlled movement  On 17ft line, Monster walks, 2RT with GTB at ankles, pt cued for larger/wider steps Stationary Bike, 5 minutes, level 3 resistance Cybex hamstring curl, 3 sets of 10 reps, 4 plates, pt cued for eccentric control with RLE [employer] Leg press, 3 sets of 10 reps, 5 plates, pt cued for eccentric control Standing with bil UE assist: alternating high hold marches 2X10  Hip abduction 2X10 each  Hip extension 2X10 each    01/08/2024  Therapeutic Exercise: -Stationary Bike, 5 minutes, level 3 resistance -Seated hamstring curl, 3 sets of 10 reps, plate 4>plate 5, pt cued for eccentric control with RLE -Leg press, 3 sets of 10 reps, palte 3>4>5, pt cued for eccentric control -Lateral stepping 2 laps 10 steps per lap, with RTB around ankles, pt cued for slow/controlled movement  -Monster walks, RTB at ankles, 2 laps 10 steps each direction, pt cued for sequencing   12/28/23 physical therapy evaluation and HEP instruction    PATIENT EDUCATION:  Education details: Patient educated on exam findings, POC, scope of PT, HEP, and what to expect next visit. Person educated: Patient Education method: Explanation, Demonstration, and Handouts Education comprehension: verbalized understanding, returned demonstration, verbal cues required, and tactile cues required  Trigger Point Dry Needling  What is Trigger Point Dry Needling (DN)? DN is a physical therapy technique used to  treat muscle pain and dysfunction. Specifically, DN helps deactivate muscle trigger points (muscle knots).  A thin filiform needle is used to penetrate the skin and stimulate the underlying trigger point. The goal is for a local twitch response (LTR) to occur and for the trigger point to relax. No medication of any kind is injected during the  procedure.  Benefits of Trigger Point Dry Needling Reduces localized tension and stiffness promoting muscle function. Promotes range of motion and flexibility of the area being treated. Relieves localized and referred muscle related pain.  Promotes localized blood flow. Benefits of DN increase when performed with formal therapy including strengthening and stretching.   What Does Trigger Point Dry Needling Feel Like?  The procedure feels different for each individual patient. Some patients report that they do not actually feel the needle enter the skin and overall the process is not painful. Very mild bleeding may occur. However, many patients feel a deep cramping in the muscle in which the needle was inserted. This is the local twitch response.   How Will I feel after the treatment? Soreness is normal, and the onset of soreness may not occur for a few hours. Typically this soreness does not last longer than two days.  Bruising is uncommon, however; ice can be used to decrease any possible bruising.  In rare cases feeling tired or nauseous after the treatment is normal. In addition, your symptoms may get worse before they get better, this period will typically not last longer than 24 hours.   What Can I do After My Treatment? Increase your hydration by drinking more water  for the next 24 hours.  You may place ice or heat on the areas treated that have become sore, however, do not use heat on inflamed or bruised areas. Heat often brings more relief post needling. You can continue your regular activities, but vigorous activity is not recommended initially after the treatment for 24 hours. DN is best combined with other physical therapy such as strengthening, stretching, and other therapies.   What are the complications? While your therapist has had extensive training in minimizing the risks of trigger point dry needling, it is important to understand the risks of any procedure.  Risks include  bleeding, pain, fatigue, hematoma, infection, vertigo, nausea or nerve involvement. Monitor for any changes to your skin or sensation. Contact your therapist or MD with concerns.  A rare but serious complication is a pneumothorax over or near your middle and upper chest and back If you have dry needling in this area, monitor for the following symptoms: Shortness of breath on exertion and/or Difficulty taking a deep breath and/or Chest Pain and/or A dry cough If any of the above symptoms develop, please go to the nearest emergency room or call 911. Tell them you had dry needling over your thorax and report any symptoms you are having. Please follow-up with your treating therapist after you complete the medical evaluation.  HOME EXERCISE PROGRAM: Access Code: [email or web address] URL: [email or web address] Date: 12/28/2023 Prepared by: [facility]  Exercises - Supine Quad Set  - 2 x daily - 7 x weekly - 1 sets - 10 reps - 5 sec hold - Sit to Stand Without Arm Support  - 2 x daily - 7 x weekly - 1 sets - 10 reps - standing single leg balance at the counter (try to not hold on)  - 2 x daily - 7 x weekly - 1 sets - 5 reps - 30 sec hold  Access Code: [email or web address] URL: [email or web address] Date: 01/08/2024 Prepared by: Lang Ada Exercises - Side Stepping with Resistance at Ankles  - 1 x daily - 7 x weekly - 2 sets - 10 reps - Band Walks  - 1 x daily - 7 x weekly - 2 sets - 10 reps  ASSESSMENT:  CLINICAL IMPRESSION: Pt with questions regarding trial of dry needling.  Pt educated and given information sheet.  Pt also encouraged to order thigh high compression garments and educated on how these will help reduce edema around knee and that edema settling above where knee high compression stops. Cues needed to take larger steps for resistance while completing sidesteps and monster walking on line.   Added standing hip strengthening at bar with UE assist without complaints and all 3  seats at higher weight on machines.  Pt able to complete all activities today without increase in knee pain.   No rest breaks needed this session, demonstrating good activity tolerance.  Pt will continue to benefit from skilled therapy to progress towards goals.     Patient is a 78 y.o. female who was seen today for physical therapy evaluation and treatment for M25.561,G89.29 (ICD-10-CM) - Chronic pain of right knee T14.8XXA (ICD-10-CM) - Bone bruise M25.361 (ICD-10-CM) - Instability of knee joint, right. Patient demonstrates muscle weakness, reduced ROM, and fascial restrictions which are likely contributing to symptoms of pain and are negatively impacting patient ability to perform ADLs and functional mobility tasks. Patient will benefit from skilled physical therapy services to address these deficits to reduce pain and improve level of function with ADLs and functional mobility tasks.   OBJECTIVE IMPAIRMENTS: Abnormal gait, decreased activity tolerance, decreased balance, decreased mobility, difficulty walking, decreased ROM, decreased strength, increased edema, increased fascial restrictions, impaired perceived functional ability, and pain.   ACTIVITY LIMITATIONS: standing, squatting, transfers, and locomotion level  PARTICIPATION LIMITATIONS: meal prep and shopping  REHAB POTENTIAL: Good  CLINICAL DECISION MAKING: Evolving/moderate complexity  EVALUATION COMPLEXITY: Moderate   GOALS: Goals reviewed with patient? Yes  SHORT TERM GOALS: Target date: 01/11/2024 patient will be independent with initial HEP  Baseline: Goal status: MET  2.  Patient will report 30% improvement overall  Baseline:  Goal status: INITIAL   LONG TERM GOALS: Target date: 01/25/2024  Patient will be independent in self management strategies to improve quality of life and functional outcomes.  Baseline:  Goal status: INITIAL  2.  Patient will report 50% improvement overall  Baseline:  Goal status:  INITIAL  3.  Patient will able to stand SLS on each leg x 10 to demonstrate improved functional balance  Baseline:  Goal status: INITIAL  4.  Patient will improve 5 times sit to stand score to 15 sec or less to demonstrate improved functional mobility and increased leg strength.    Baseline: 17.34 Goal status: INITIAL  5.  Patient will increase knee mobility to 0 to 120 to promote normal navigation of steps; step over step pattern  Baseline:  Goal status: INITIAL  6.   Patient will increase right leg MMT's to 5/5 to allow navigation of steps without gait deviation or loss of balance  Baseline:  Goal status: INITIAL  PLAN:  PT FREQUENCY: 2x/week  PT DURATION: 4 weeks  PLANNED INTERVENTIONS: [id number]- PT Re-evaluation, 97110-Therapeutic exercises, 97530- Therapeutic activity, 97112- Neuromuscular re-education, 97535- Self Care, 02859- Manual therapy, [id number]- Gait training, [id number]- Orthotic Fit/training, [id number]- Canalith repositioning, [id number]- Aquatic Therapy, [id number]- Splinting, [id number]- Wound care (first 20 sq cm), 02401- Wound care (  each additional 20 sq cm)Patient/Family education, Balance training, Stair training, Taping, Dry Needling, Joint mobilization, Joint manipulation, Spinal manipulation, Spinal mobilization, Scar mobilization, and DME instructions.   PLAN FOR NEXT SESSION: continue to improve Rt knee mobility and strength while reducing pain.  Progress balance as able. Schedule Dry needling appt as needed.  Greig KATHEE Fuse, PTA/CLT [facility] Ph: [phone]  11:49 AM, 01/10/24

## 2024-01-15 ENCOUNTER — Encounter (HOSPITAL_COMMUNITY)

## 2024-01-17 ENCOUNTER — Ambulatory Visit (HOSPITAL_COMMUNITY): Payer: Self-pay | Attending: Orthopedic Surgery

## 2024-01-17 ENCOUNTER — Encounter (HOSPITAL_COMMUNITY): Admitting: Physical Therapy

## 2024-01-17 DIAGNOSIS — M25561 Pain in right knee: Secondary | ICD-10-CM | POA: Insufficient documentation

## 2024-01-17 DIAGNOSIS — R262 Difficulty in walking, not elsewhere classified: Secondary | ICD-10-CM | POA: Diagnosis present

## 2024-01-17 DIAGNOSIS — Z7409 Other reduced mobility: Secondary | ICD-10-CM | POA: Diagnosis present

## 2024-01-17 NOTE — Therapy (Signed)
 OUTPATIENT PHYSICAL THERAPY LOWER EXTREMITY TREATMENT   Patient Name: Kara [name] MRN: [id number] DOB:04-12-1946, 78 y.o., female Today's Date: 01/17/2024  END OF SESSION:  PT End of Session - 01/17/24 1100     Visit Number 3    Number of Visits 8    Date for PT Re-Evaluation 01/25/24    Authorization Type medicare/ BCBS secondary    Progress Note Due on Visit 10    PT Start Time 1103    PT Stop Time 1148    PT Time Calculation (min) 45 min    Activity Tolerance Patient tolerated treatment well    Behavior During Therapy [facility] for tasks assessed/performed            Past Medical History:  Diagnosis Date   Allergy     food allergies, drug allergies   Anemia    Anxiety    Arthritis    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  The STAT Breast cancer panel offered by [facility] includes sequencing and rearrangement analysis for the following 9 genes:  ATM, BRCA1, BRCA2, CDH1, CHEK2, PALB2, PTEN, STK11 and TP53.   The report date is August 09, 2019.   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 fibroids   BIOPSY  05/02/2022   Procedure: BIOPSY;  Surgeon: Cindie Carlin POUR, DO;  [facility];  Service: Endoscopy;;   BREAST BIOPSY Left 05/2019   left breast DCIS   BREAST LUMPECTOMY Left 06/2019   DCIS   BREAST LUMPECTOMY WITH RADIOACTIVE SEED LOCALIZATION Left 07/08/2019   Procedure: LEFT BREAST LUMPECTOMY WITH RADIOACTIVE SEED LOCALIZATION;  Surgeon: Curvin Deward MOULD, MD;  Location: [facility];  Service: General;  Laterality: Left;   BREAST SURGERY N/A    Phreesia 08/30/2020   CESAREAN SECTION     CESAREAN SECTION N/A    Phreesia 08/30/2020   CHOLECYSTECTOMY     COLONOSCOPY  2011   [place] : two 3-4 mm sessile polyps, hyperplastic, sigmoid diverticula, TI appeared normal.    COLONOSCOPY WITH PROPOFOL  N/A 05/02/2022   Procedure: COLONOSCOPY WITH PROPOFOL ;  Surgeon: Cindie Carlin POUR, DO;  [facility];  Service: Endoscopy;  Laterality: N/A;  11:30 am   ESOPHAGOGASTRODUODENOSCOPY  2011   [place] : non-bleeding erosive gastropathy, normal duodenum. path: unremarkable duodenum, negative H.pylori, minimal esophagitis    ESOPHAGOGASTRODUODENOSCOPY N/A 11/23/2022   Procedure: ESOPHAGOGASTRODUODENOSCOPY (EGD);  Surgeon: Wilhelmenia Aloha Raddle., MD;  Location: [facility];  Service: Gastroenterology;  Laterality: N/A;   ESOPHAGOGASTRODUODENOSCOPY (EGD) WITH PROPOFOL  N/A 05/02/2022   Procedure: ESOPHAGOGASTRODUODENOSCOPY (EGD) WITH PROPOFOL ;  Surgeon: Cindie Carlin POUR, DO;  [facility];  Service: Endoscopy;  Laterality: N/A;   EUS N/A 11/23/2022    Procedure: UPPER ENDOSCOPIC ULTRASOUND (EUS) RADIAL;  Surgeon: Wilhelmenia Aloha Raddle., MD;  Location: [facility];  Service: Gastroenterology;  Laterality: N/A;   EYE SURGERY  2022   IR RADIOLOGIST EVAL & MGMT  06/20/2023   POLYPECTOMY  05/02/2022   Procedure: POLYPECTOMY;  Surgeon: Cindie Carlin POUR, DO;  [facility];  Service: Endoscopy;;   REFRACTIVE SURGERY  2024   TONSILLECTOMY  1975   Patient Active Problem List   Diagnosis Date Noted   History of anaphylaxis 11/05/2023   Class 2 severe obesity due to excess calories with serious comorbidity and body mass index (BMI) of 35.0 to 35.9 in adult (HCC) 07/10/2023   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   Obesity (BMI 30-39.9) 05/24/2023   OSA (obstructive sleep apnea) 07/21/2022   H/o Lyme disease 07/21/2022   Hypotension 07/03/2022   Idiopathic peripheral neuropathy 04/12/2022   RUQ pain 04/05/2022   Encounter for screening colonoscopy 04/05/2022   Leg swelling 05/25/2021   Vitamin D  deficiency 03/11/2021   Plantar fasciitis of right foot 10/13/2020   Trigger middle finger of right hand 10/13/2020   Intermittent palpitations 04/29/2020   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   Shortness of breath 02/04/2020   COVID-19 01/14/2020   Trochanteric bursitis of left hip 12/30/2019   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   Hypothyroidism following radioiodine therapy 03/06/2019   Prediabetes 08/30/2017   Mixed hyperlipidemia 08/23/2016   GERD (gastroesophageal reflux disease) 08/14/2016    PCP: Tobie Suzzane POUR, MD  REFERRING PROVIDER: Onesimo Oneil LABOR, MD  REFERRING DIAG: [phone] (ICD-10-CM) - Chronic pain of right knee T14.8XXA (ICD-10-CM) - Bone bruise M25.361 (ICD-10-CM) - Instability of knee joint, right  THERAPY DIAG:  Right knee pain, unspecified chronicity  Difficulty in walking, not elsewhere classified  Impaired functional mobility, balance, gait, and  endurance  Rationale for Evaluation and Treatment: Rehabilitation  ONSET DATE: pain since Nov of 2024  SUBJECTIVE:   SUBJECTIVE STATEMENT: Pt states she has been feeling better overall and the exercises seem to help but also sometimes give her some pain, especially in the hip sometimes. Interested and wants to do dry needling today.   Evaluation:  Right knee pain started hurting in Nov; was told she had an infection per MRI top of tibia but was not given antibiotic; was told to stay off the knee so she has periodically been using RW or cane off and on.  Has affected the way she walks and this makes her back hurt.  She is favoring that side and sometimes that right knee will give way.  Has seen Dr Onesimo 3 times; given an injection on first visit and that did not help at all.  Also had a second opinion in [place]  but has not heard back from them.  That MD wanted her to have a visco supplementation injection.  Referred to therapy.  PERTINENT HISTORY: Lymphedema bilateral Lower extremities  PAIN:  Are you having pain? Yes: NPRS scale: 0/10; 7/10 at worst Pain location: inside and across the front Pain description: gives way Aggravating factors: gives way Relieving factors: rest; ice  PRECAUTIONS: None  RED FLAGS: None   WEIGHT BEARING RESTRICTIONS: No  FALLS:  Has patient fallen in last 6 months? No  OCCUPATION: retired  PLOF: Independent  PATIENT GOALS: get more strength back in my legs and be more stable; be able to walk more; walk again  NEXT MD VISIT: Sept  OBJECTIVE:  Note: Objective measures were completed at Evaluation unless otherwise noted.  DIAGNOSTIC FINDINGS:   CLINICAL DATA:  Meniscal injury, knee   Right knee instability for 3 months. No known injury or prior relevant surgery.   EXAM: MRI OF THE RIGHT KNEE WITHOUT CONTRAST   TECHNIQUE: Multiplanar, multisequence MR imaging of the knee was performed. No intravenous contrast was administered.    COMPARISON:  Radiographs 02/13/2023.  MRI 08/16/2019.   FINDINGS: MENISCI   Medial meniscus:  Intact with normal morphology.   Lateral meniscus: On the previous MRI, there was an apparent large parameniscal cyst peripherally which is no longer present. The lateral meniscus appears degenerated with mild free edge fraying, although no discrete meniscal tear or displaced meniscal fragment is demonstrated.   LIGAMENTS   Cruciates: The anterior and posterior cruciate ligaments are intact.   Collaterals: The medial and lateral collateral ligament complexes are intact.   CARTILAGE   Patellofemoral:  Preserved.   Medial: Mild to moderate chondral thinning and surface irregularity. No full-thickness chondral defect.   Lateral: Mild to moderate chondral thinning and surface irregularity. No full-thickness chondral defect.   MISCELLANEOUS   Joint:  Small joint effusion.   Popliteal Fossa: The popliteus muscle and tendon are intact. Small Baker's cyst.   Extensor Mechanism: Stable mild proximal patellar tendinosis. The quadriceps tendon appears normal. The patellar retinacula are intact.   Bones: New prominent marrow edema throughout the lateral tibial plateau, greatest posteriorly. Questionable underlying subchondral insufficiency fracture, best seen on the sagittal images. Mild surrounding soft tissue edema. No other significant osseous abnormalities are identified.   Other: No other significant periarticular soft tissue findings.   IMPRESSION: 1. New prominent marrow edema throughout the lateral tibial plateau, greatest posteriorly, with questionable underlying subchondral insufficiency fracture. 2. The lateral meniscus appears degenerated with mild free edge fraying, although no discrete meniscal tear or displaced meniscal fragment is demonstrated. No residual parameniscal cyst is seen. 3. The medial meniscus, cruciate and collateral ligaments are intact. 4. Mild  to moderate medial and lateral compartment degenerative chondrosis. 5. Small joint effusion and small Baker's cyst.     Electronically Signed   By: Elsie Perone M.D.   On: 07/19/2023 15:27  PATIENT SURVEYS:  LEFS  Extreme difficulty/unable (0), Quite a bit of difficulty (1), Moderate difficulty (2), Little difficulty (3), No difficulty (4) Survey date:    Score total:  50/80; 62.5%     COGNITION: Overall cognitive status: Within functional limits for tasks assessed     SENSATION: None currently  EDEMA:  Both legs swell; lymphedema both legs   POSTURE: rounded shoulders and forward head  PALPATION: General soreness  LOWER EXTREMITY MMTS  MMTs Right eval Left eval  Hip flexion 3+ 4+  Hip extension    Hip abduction    Hip adduction    Hip internal rotation    Hip external rotation    Knee flexion  Knee extension 4- 4+  Ankle dorsiflexion 4- 4-  Ankle plantarflexion    Ankle inversion    Ankle eversion     (Blank rows = not tested)  LOWER EXTREMITY ROM  ROM Right eval Left eval  Hip flexion    Hip extension    Hip abduction    Hip adduction    Hip internal rotation    Hip external rotation    Knee flexion 105   Knee extension -5 0  Ankle dorsiflexion    Ankle plantarflexion    Ankle inversion    Ankle eversion     (Blank rows = not tested)    FUNCTIONAL TESTS:  5 times sit to stand: 17.34  sec hands on knees Timed up and go (TUG): next visit 2 minute walk test: next visit SLS right 5 and left 1  GAIT: Distance walked: 60 ft in clinic Assistive device utilized: None Level of assistance: Complete Independence Comments: slow gait speed; cautious                                                                                                                                TREATMENT DATE:  01/17/24 Trigger Point Dry Needling  Initial Treatment: Pt instructed on Dry Needling rational, procedures, and possible side effects. Pt  instructed to expect mild to moderate muscle soreness later in the day and/or into the next day.  Pt instructed in methods to reduce muscle soreness. Pt instructed to continue prescribed HEP. Patient was educated on signs and symptoms of infection and other risk factors and advised to seek medical attention should they occur.  Patient verbalized understanding of these instructions and education.   Patient Verbal Consent Given: Yes Education Handout Provided: Previously Provided Muscles Treated: quads and hamstrings R LE Electrical Stimulation Performed: No Treatment Response/Outcome: twitch elicitted (HS); aching and soreness; improved tolerance to mobility/STM and WB  LAQ seated with end range quad/HS activation post DN Education and performance of HS and gastroc stretch secondary to increased tightness/soreness over aspect while performing recumbent stepper in order to improve blood flow/perfusion post dry needling performance    01/10/2024  On 20Ft line, Lateral stepping 2RT with GTB around ankles, pt cued for slow/controlled movement  On 49ft line, Monster walks, 2RT with GTB at ankles, pt cued for larger/wider steps Stationary Bike, 5 minutes, level 3 resistance Cybex hamstring curl, 3 sets of 10 reps, 4 plates, pt cued for eccentric control with RLE [employer] Leg press, 3 sets of 10 reps, 5 plates, pt cued for eccentric control Standing with bil UE assist: alternating high hold marches 2X10  Hip abduction 2X10 each  Hip extension 2X10 each    01/08/2024  Therapeutic Exercise: -Stationary Bike, 5 minutes, level 3 resistance -Seated hamstring curl, 3 sets of 10 reps, plate 4>plate 5, pt cued for eccentric control with RLE -Leg press, 3 sets of 10 reps, palte 3>4>5, pt cued for eccentric control -Lateral stepping  2 laps 10 steps per lap, with RTB around ankles, pt cued for slow/controlled movement  -Monster walks, RTB at ankles, 2 laps 10 steps each direction, pt cued for  sequencing   12/28/23 physical therapy evaluation and HEP instruction    PATIENT EDUCATION:  Education details: Patient educated on exam findings, POC, scope of PT, HEP, and what to expect next visit. Person educated: Patient Education method: Explanation, Demonstration, and Handouts Education comprehension: verbalized understanding, returned demonstration, verbal cues required, and tactile cues required  Trigger Point Dry Needling  What is Trigger Point Dry Needling (DN)? DN is a physical therapy technique used to treat muscle pain and dysfunction. Specifically, DN helps deactivate muscle trigger points (muscle knots).  A thin filiform needle is used to penetrate the skin and stimulate the underlying trigger point. The goal is for a local twitch response (LTR) to occur and for the trigger point to relax. No medication of any kind is injected during the procedure.  Benefits of Trigger Point Dry Needling Reduces localized tension and stiffness promoting muscle function. Promotes range of motion and flexibility of the area being treated. Relieves localized and referred muscle related pain.  Promotes localized blood flow. Benefits of DN increase when performed with formal therapy including strengthening and stretching.   What Does Trigger Point Dry Needling Feel Like?  The procedure feels different for each individual patient. Some patients report that they do not actually feel the needle enter the skin and overall the process is not painful. Very mild bleeding may occur. However, many patients feel a deep cramping in the muscle in which the needle was inserted. This is the local twitch response.   How Will I feel after the treatment? Soreness is normal, and the onset of soreness may not occur for a few hours. Typically this soreness does not last longer than two days.  Bruising is uncommon, however; ice can be used to decrease any possible bruising.  In rare cases feeling tired or  nauseous after the treatment is normal. In addition, your symptoms may get worse before they get better, this period will typically not last longer than 24 hours.   What Can I do After My Treatment? Increase your hydration by drinking more water  for the next 24 hours.  You may place ice or heat on the areas treated that have become sore, however, do not use heat on inflamed or bruised areas. Heat often brings more relief post needling. You can continue your regular activities, but vigorous activity is not recommended initially after the treatment for 24 hours. DN is best combined with other physical therapy such as strengthening, stretching, and other therapies.   What are the complications? While your therapist has had extensive training in minimizing the risks of trigger point dry needling, it is important to understand the risks of any procedure.  Risks include bleeding, pain, fatigue, hematoma, infection, vertigo, nausea or nerve involvement. Monitor for any changes to your skin or sensation. Contact your therapist or MD with concerns.  A rare but serious complication is a pneumothorax over or near your middle and upper chest and back If you have dry needling in this area, monitor for the following symptoms: Shortness of breath on exertion and/or Difficulty taking a deep breath and/or Chest Pain and/or A dry cough If any of the above symptoms develop, please go to the nearest emergency room or call 911. Tell them you had dry needling over your thorax and report any symptoms you are having. Please  follow-up with your treating therapist after you complete the medical evaluation.  HOME EXERCISE PROGRAM: Access Code: [email or web address] URL: [email or web address] Date: 01/17/2024 Prepared by: Lamarr Citrin  Exercises - Seated Hamstring Stretch  - 1 x daily - 7 x weekly - 3 sets - 6 reps - 10s hold - Seated Gastroc Stretch with Strap  - 1 x daily - 7 x weekly - 3 sets - 4 reps - 20s  hold  Access Code: [email or web address] URL: [email or web address] Date: 12/28/2023 Prepared by: [facility]  Exercises - Supine Quad Set  - 2 x daily - 7 x weekly - 1 sets - 10 reps - 5 sec hold - Sit to Stand Without Arm Support  - 2 x daily - 7 x weekly - 1 sets - 10 reps - standing single leg balance at the counter (try to not hold on)  - 2 x daily - 7 x weekly - 1 sets - 5 reps - 30 sec hold  Access Code: [email or web address] URL: [email or web address] Date: 01/08/2024 Prepared by: Lang Ada Exercises - Side Stepping with Resistance at Ankles  - 1 x daily - 7 x weekly - 2 sets - 10 reps - Band Walks  - 1 x daily - 7 x weekly - 2 sets - 10 reps  ASSESSMENT:  CLINICAL IMPRESSION: Pt with slight increased pain in medial aspect of knee addressed with TDN post performance of manual techniques for identifying mtrp local. Noted medial HS and quad ttp therefore addressed with education, DN and post-manual perfusion through LAQ and recumbent stepper. Provided HS and gastroc stretching HEP addition in order to address tightness noted. Discussed continued PT pending progression in pain tolerance post TDN. Pt will continue to benefit from skilled therapy to progress towards goals.     Patient is a 78 y.o. female who was seen today for physical therapy evaluation and treatment for M25.561,G89.29 (ICD-10-CM) - Chronic pain of right knee T14.8XXA (ICD-10-CM) - Bone bruise M25.361 (ICD-10-CM) - Instability of knee joint, right. Patient demonstrates muscle weakness, reduced ROM, and fascial restrictions which are likely contributing to symptoms of pain and are negatively impacting patient ability to perform ADLs and functional mobility tasks. Patient will benefit from skilled physical therapy services to address these deficits to reduce pain and improve level of function with ADLs and functional mobility tasks.   OBJECTIVE IMPAIRMENTS: Abnormal gait, decreased activity tolerance, decreased  balance, decreased mobility, difficulty walking, decreased ROM, decreased strength, increased edema, increased fascial restrictions, impaired perceived functional ability, and pain.   ACTIVITY LIMITATIONS: standing, squatting, transfers, and locomotion level  PARTICIPATION LIMITATIONS: meal prep and shopping  REHAB POTENTIAL: Good  CLINICAL DECISION MAKING: Evolving/moderate complexity  EVALUATION COMPLEXITY: Moderate   GOALS: Goals reviewed with patient? Yes  SHORT TERM GOALS: Target date: 01/11/2024 patient will be independent with initial HEP  Baseline: Goal status: MET  2.  Patient will report 30% improvement overall  Baseline:  Goal status: INITIAL   LONG TERM GOALS: Target date: 01/25/2024  Patient will be independent in self management strategies to improve quality of life and functional outcomes.  Baseline:  Goal status: INITIAL  2.  Patient will report 50% improvement overall  Baseline:  Goal status: INITIAL  3.  Patient will able to stand SLS on each leg x 10 to demonstrate improved functional balance  Baseline:  Goal status: INITIAL  4.  Patient will improve 5 times sit to stand score to 15 sec or less to demonstrate improved functional mobility and increased leg strength.  Baseline: 17.34 Goal status: INITIAL  5.  Patient will increase knee mobility to 0 to 120 to promote normal navigation of steps; step over step pattern  Baseline:  Goal status: INITIAL  6.   Patient will increase right leg MMT's to 5/5 to allow navigation of steps without gait deviation or loss of balance  Baseline:  Goal status: INITIAL  PLAN:  PT FREQUENCY: 2x/week  PT DURATION: 4 weeks  PLANNED INTERVENTIONS: [id number]- PT Re-evaluation, 97110-Therapeutic exercises, 97530- Therapeutic activity, 97112- Neuromuscular re-education, 97535- Self Care, [id number]- Manual therapy, [id number]- Gait training, [phone]- Orthotic Fit/training, [phone]- Canalith repositioning, [id number]- Aquatic  Therapy, 97760- Splinting, [phone]- Wound care (first 20 sq cm), 97598- Wound care (each additional 20 sq cm)Patient/Family education, Balance training, Stair training, Taping, Dry Needling, Joint mobilization, Joint manipulation, Spinal manipulation, Spinal mobilization, Scar mobilization, and DME instructions.   PLAN FOR NEXT SESSION: continue to improve Rt knee mobility and strength while reducing pain.  TDN as needed. Reassess visits/goals next session  Lamarr LITTIE Citrin PT, DPT [facility]- [facility] office   12:06 PM, 01/17/24

## 2024-01-22 ENCOUNTER — Encounter (HOSPITAL_COMMUNITY): Admitting: Physical Therapy

## 2024-01-23 ENCOUNTER — Encounter: Payer: Self-pay | Admitting: Cardiovascular Disease

## 2024-01-25 ENCOUNTER — Encounter (HOSPITAL_COMMUNITY)

## 2024-01-25 ENCOUNTER — Ambulatory Visit: Admitting: Orthopedic Surgery

## 2024-01-29 ENCOUNTER — Encounter (HOSPITAL_COMMUNITY): Payer: Self-pay

## 2024-01-29 ENCOUNTER — Ambulatory Visit (HOSPITAL_COMMUNITY)

## 2024-01-29 DIAGNOSIS — Z7409 Other reduced mobility: Secondary | ICD-10-CM

## 2024-01-29 DIAGNOSIS — M25561 Pain in right knee: Secondary | ICD-10-CM | POA: Diagnosis not present

## 2024-01-29 DIAGNOSIS — R262 Difficulty in walking, not elsewhere classified: Secondary | ICD-10-CM | POA: Diagnosis not present

## 2024-01-29 NOTE — Therapy (Signed)
 OUTPATIENT PHYSICAL THERAPY LOWER EXTREMITY TREATMENT   Patient Name: Kara [name] MRN: [id number] DOB:1946/03/09, 78 y.o., female Today's Date: 01/29/2024  END OF SESSION:  PT End of Session - 01/29/24 1203     Visit Number 4    Number of Visits 8    Date for PT Re-Evaluation 01/25/24    Authorization Type Medicare / BCBS secondary    Progress Note Due on Visit 10    PT Start Time 1018    PT Stop Time 1100    PT Time Calculation (min) 42 min    Activity Tolerance Patient tolerated treatment well    Behavior During Therapy [facility] for tasks assessed/performed             Past Medical History:  Diagnosis Date   Allergy     food allergies, drug allergies   Anemia    Anxiety    Arthritis    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  The STAT Breast cancer panel offered by [facility] includes sequencing and rearrangement analysis for the following 9 genes:  ATM, BRCA1, BRCA2, CDH1, CHEK2, PALB2, PTEN, STK11 and TP53.   The report date is August 09, 2019.   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 fibroids   BIOPSY  05/02/2022   Procedure: BIOPSY;  Surgeon: Cindie Carlin POUR, DO;  [facility];  Service: Endoscopy;;   BREAST BIOPSY Left 05/2019   left breast DCIS   BREAST LUMPECTOMY Left 06/2019   DCIS   BREAST LUMPECTOMY WITH RADIOACTIVE SEED LOCALIZATION Left 07/08/2019   Procedure: LEFT BREAST LUMPECTOMY WITH RADIOACTIVE SEED LOCALIZATION;  Surgeon: Curvin Deward MOULD, MD;  Location: [facility];  Service: General;  Laterality: Left;   BREAST SURGERY N/A    Phreesia 08/30/2020   CESAREAN SECTION     CESAREAN SECTION N/A    Phreesia 08/30/2020   CHOLECYSTECTOMY     COLONOSCOPY  2011   [place] : two 3-4 mm sessile polyps, hyperplastic, sigmoid diverticula, TI appeared normal.    COLONOSCOPY WITH PROPOFOL  N/A 05/02/2022   Procedure: COLONOSCOPY WITH PROPOFOL ;  Surgeon: Cindie Carlin  K, DO;  [facility]: [facility];  Service: Endoscopy;  Laterality: N/A;  11:30 am   ESOPHAGOGASTRODUODENOSCOPY  2011   [place] : non-bleeding erosive gastropathy, normal duodenum. path: unremarkable duodenum, negative H.pylori, minimal esophagitis    ESOPHAGOGASTRODUODENOSCOPY N/A 11/23/2022   Procedure: ESOPHAGOGASTRODUODENOSCOPY (EGD);  Surgeon: Wilhelmenia Aloha Raddle., MD;  Location: [facility];  Service: Gastroenterology;  Laterality: N/A;   ESOPHAGOGASTRODUODENOSCOPY (EGD) WITH PROPOFOL  N/A 05/02/2022   Procedure: ESOPHAGOGASTRODUODENOSCOPY (EGD) WITH PROPOFOL ;  Surgeon: Cindie Carlin POUR, DO;  [facility];  Service: Endoscopy;  Laterality: N/A;   EUS N/A 11/23/2022    Procedure: UPPER ENDOSCOPIC ULTRASOUND (EUS) RADIAL;  Surgeon: Wilhelmenia Aloha Raddle., MD;  Location: [facility];  Service: Gastroenterology;  Laterality: N/A;   EYE SURGERY  2022   IR RADIOLOGIST EVAL & MGMT  06/20/2023   POLYPECTOMY  05/02/2022   Procedure: POLYPECTOMY;  Surgeon: Cindie Carlin POUR, DO;  [facility];  Service: Endoscopy;;   REFRACTIVE SURGERY  2024   TONSILLECTOMY  1975   Patient Active Problem List   Diagnosis Date Noted   History of anaphylaxis 11/05/2023   Class 2 severe obesity due to excess calories with serious comorbidity and body mass index (BMI) of 35.0 to 35.9 in adult (HCC) 07/10/2023   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   Obesity (BMI 30-39.9) 05/24/2023   OSA (obstructive sleep apnea) 07/21/2022   H/o Lyme disease 07/21/2022   Hypotension 07/03/2022   Idiopathic peripheral neuropathy 04/12/2022   RUQ pain 04/05/2022   Encounter for screening colonoscopy 04/05/2022   Leg swelling 05/25/2021   Vitamin D  deficiency 03/11/2021   Plantar fasciitis of right foot 10/13/2020   Trigger middle finger of right hand 10/13/2020   Intermittent palpitations 04/29/2020   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   Shortness of breath 02/04/2020   COVID-19 01/14/2020   Trochanteric bursitis of left hip 12/30/2019   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   Hypothyroidism following radioiodine therapy 03/06/2019   Prediabetes 08/30/2017   Mixed hyperlipidemia 08/23/2016   GERD (gastroesophageal reflux disease) 08/14/2016    PCP: Tobie Suzzane POUR, MD  REFERRING PROVIDER: Onesimo Oneil LABOR, MD  REFERRING DIAG: [phone] (ICD-10-CM) - Chronic pain of right knee T14.8XXA (ICD-10-CM) - Bone bruise M25.361 (ICD-10-CM) - Instability of knee joint, right  THERAPY DIAG:  Right knee pain, unspecified chronicity  Difficulty in walking, not elsewhere classified  Impaired functional mobility, balance, gait, and  endurance  Rationale for Evaluation and Treatment: Rehabilitation  ONSET DATE: pain since Nov of 2024  SUBJECTIVE:   SUBJECTIVE STATEMENT: Pt reports the dry needling helped a good bit and 2 days after she didn't have much pain at all. After going and walking all over the place in [place]  she had some pain. Still most of the time bothers her when she crosses her legs in the back of her knee.   Evaluation:  Right knee pain started hurting in Nov; was told she had an infection per MRI top of tibia but was not given antibiotic; was told to stay off the knee so she has periodically been using RW or cane off and on.  Has affected the way she walks and this makes her back hurt.  She is favoring that side and sometimes that right knee will give way.  Has seen Dr Onesimo 3 times; given an injection on first visit and that did not help at all.  Also had a second opinion in [place]  but has not  heard back from them.  That MD wanted her to have a visco supplementation injection.  Referred to therapy.    PERTINENT HISTORY: Lymphedema bilateral Lower extremities  PAIN:  Are you having pain? Yes: NPRS scale: 0/10; 7/10 at worst Pain location: inside and across the front Pain description: gives way Aggravating factors: gives way Relieving factors: rest; ice  PRECAUTIONS: None  RED FLAGS: None   WEIGHT BEARING RESTRICTIONS: No  FALLS:  Has patient fallen in last 6 months? No  OCCUPATION: retired  PLOF: Independent  PATIENT GOALS: get more strength back in my legs and be more stable; be able to walk more; walk again  NEXT MD VISIT: Sept  OBJECTIVE:  Note: Objective measures were completed at Evaluation unless otherwise noted.  DIAGNOSTIC FINDINGS:   CLINICAL DATA:  Meniscal injury, knee   Right knee instability for 3 months. No known injury or prior relevant surgery.   EXAM: MRI OF THE RIGHT KNEE WITHOUT CONTRAST   TECHNIQUE: Multiplanar, multisequence MR imaging of the knee was  performed. No intravenous contrast was administered.   COMPARISON:  Radiographs 02/13/2023.  MRI 08/16/2019.   FINDINGS: MENISCI   Medial meniscus:  Intact with normal morphology.   Lateral meniscus: On the previous MRI, there was an apparent large parameniscal cyst peripherally which is no longer present. The lateral meniscus appears degenerated with mild free edge fraying, although no discrete meniscal tear or displaced meniscal fragment is demonstrated.   LIGAMENTS   Cruciates: The anterior and posterior cruciate ligaments are intact.   Collaterals: The medial and lateral collateral ligament complexes are intact.   CARTILAGE   Patellofemoral:  Preserved.   Medial: Mild to moderate chondral thinning and surface irregularity. No full-thickness chondral defect.   Lateral: Mild to moderate chondral thinning and surface irregularity. No full-thickness chondral defect.   MISCELLANEOUS   Joint:  Small joint effusion.   Popliteal Fossa: The popliteus muscle and tendon are intact. Small Baker's cyst.   Extensor Mechanism: Stable mild proximal patellar tendinosis. The quadriceps tendon appears normal. The patellar retinacula are intact.   Bones: New prominent marrow edema throughout the lateral tibial plateau, greatest posteriorly. Questionable underlying subchondral insufficiency fracture, best seen on the sagittal images. Mild surrounding soft tissue edema. No other significant osseous abnormalities are identified.   Other: No other significant periarticular soft tissue findings.   IMPRESSION: 1. New prominent marrow edema throughout the lateral tibial plateau, greatest posteriorly, with questionable underlying subchondral insufficiency fracture. 2. The lateral meniscus appears degenerated with mild free edge fraying, although no discrete meniscal tear or displaced meniscal fragment is demonstrated. No residual parameniscal cyst is seen. 3. The medial meniscus,  cruciate and collateral ligaments are intact. 4. 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 5. Small joint effusion and small Baker's cyst.     Electronically Signed   By: Elsie Perone M.D.   On: 07/19/2023 15:27  PATIENT SURVEYS:  LEFS  Extreme difficulty/unable (0), Quite a bit of difficulty (1), Moderate difficulty (2), Little difficulty (3), No difficulty (4) Survey date:    Score total:  50/80; 62.5%     COGNITION: Overall cognitive status: Within functional limits for tasks assessed     SENSATION: None currently  EDEMA:  Both legs swell; lymphedema both legs   POSTURE: rounded shoulders and forward head  PALPATION: General soreness  LOWER EXTREMITY MMTS  MMTs Right eval Left eval  Hip flexion 3+ 4+  Hip extension    Hip abduction  Hip adduction    Hip internal rotation    Hip external rotation    Knee flexion    Knee extension 4- 4+  Ankle dorsiflexion 4- 4-  Ankle plantarflexion    Ankle inversion    Ankle eversion     (Blank rows = not tested)  LOWER EXTREMITY ROM  ROM Right eval Left eval  Hip flexion    Hip extension    Hip abduction    Hip adduction    Hip internal rotation    Hip external rotation    Knee flexion 105   Knee extension -5 0  Ankle dorsiflexion    Ankle plantarflexion    Ankle inversion    Ankle eversion     (Blank rows = not tested)    FUNCTIONAL TESTS:  5 times sit to stand: 17.34  sec hands on knees Timed up and go (TUG): next visit 2 minute walk test: next visit SLS right 5 and left 1  GAIT: Distance walked: 60 ft in clinic Assistive device utilized: None Level of assistance: Complete Independence Comments: slow gait speed; cautious                                                                                                                                TREATMENT DATE:  01/29/24  Trigger Point Dry Needling - 01/29/24 0001     Consent Given? Yes    Education  Handout Provided Previously provided    Muscles Treated Lower Quadrant Vastus lateralis;Vastus medialis;Hamstring;Rectus femoris    Dry Needling Comments in situ quad / pistoning hamstring distal    Hamstring Response Twitch response elicited          Educated on continuing to perform stretches and compiled HEP together to optimize potential for progress and tolerance to mobility  Recumbent bike for improving functional engagement LE mm   01/17/24 Trigger Point Dry Needling  Initial Treatment: Pt instructed on Dry Needling rational, procedures, and possible side effects. Pt instructed to expect mild to moderate muscle soreness later in the day and/or into the next day.  Pt instructed in methods to reduce muscle soreness. Pt instructed to continue prescribed HEP. Patient was educated on signs and symptoms of infection and other risk factors and advised to seek medical attention should they occur.  Patient verbalized understanding of these instructions and education.   Patient Verbal Consent Given: Yes Education Handout Provided: Previously Provided Muscles Treated: quads and hamstrings R LE Electrical Stimulation Performed: No Treatment Response/Outcome: twitch elicitted (HS); aching and soreness; improved tolerance to mobility/STM and WB  LAQ seated with end range quad/HS activation post DN Education and performance of HS and gastroc stretch secondary to increased tightness/soreness over aspect while performing recumbent stepper in order to improve blood flow/perfusion post dry needling performance    01/10/2024  On 20Ft line, Lateral stepping 2RT with GTB around ankles, pt cued for slow/controlled movement  On 54ft line, Monster walks,  2RT with GTB at ankles, pt cued for larger/wider steps Stationary Bike, 5 minutes, level 3 resistance Cybex hamstring curl, 3 sets of 10 reps, 4 plates, pt cued for eccentric control with RLE [employer] Leg press, 3 sets of 10 reps, 5 plates, pt cued  for eccentric control Standing with bil UE assist: alternating high hold marches 2X10  Hip abduction 2X10 each  Hip extension 2X10 each    01/08/2024  Therapeutic Exercise: -Stationary Bike, 5 minutes, level 3 resistance -Seated hamstring curl, 3 sets of 10 reps, plate 4>plate 5, pt cued for eccentric control with RLE -Leg press, 3 sets of 10 reps, palte 3>4>5, pt cued for eccentric control -Lateral stepping 2 laps 10 steps per lap, with RTB around ankles, pt cued for slow/controlled movement  -Monster walks, RTB at ankles, 2 laps 10 steps each direction, pt cued for sequencing   12/28/23 physical therapy evaluation and HEP instruction    PATIENT EDUCATION:  Education details: Patient educated on exam findings, POC, scope of PT, HEP, and what to expect next visit. Person educated: Patient Education method: Explanation, Demonstration, and Handouts Education comprehension: verbalized understanding, returned demonstration, verbal cues required, and tactile cues required  Trigger Point Dry Needling  What is Trigger Point Dry Needling (DN)? DN is a physical therapy technique used to treat muscle pain and dysfunction. Specifically, DN helps deactivate muscle trigger points (muscle knots).  A thin filiform needle is used to penetrate the skin and stimulate the underlying trigger point. The goal is for a local twitch response (LTR) to occur and for the trigger point to relax. No medication of any kind is injected during the procedure.  Benefits of Trigger Point Dry Needling Reduces localized tension and stiffness promoting muscle function. Promotes range of motion and flexibility of the area being treated. Relieves localized and referred muscle related pain.  Promotes localized blood flow. Benefits of DN increase when performed with formal therapy including strengthening and stretching.   What Does Trigger Point Dry Needling Feel Like?  The procedure feels different for each individual  patient. Some patients report that they do not actually feel the needle enter the skin and overall the process is not painful. Very mild bleeding may occur. However, many patients feel a deep cramping in the muscle in which the needle was inserted. This is the local twitch response.   How Will I feel after the treatment? Soreness is normal, and the onset of soreness may not occur for a few hours. Typically this soreness does not last longer than two days.  Bruising is uncommon, however; ice can be used to decrease any possible bruising.  In rare cases feeling tired or nauseous after the treatment is normal. In addition, your symptoms may get worse before they get better, this period will typically not last longer than 24 hours.   What Can I do After My Treatment? Increase your hydration by drinking more water  for the next 24 hours.  You may place ice or heat on the areas treated that have become sore, however, do not use heat on inflamed or bruised areas. Heat often brings more relief post needling. You can continue your regular activities, but vigorous activity is not recommended initially after the treatment for 24 hours. DN is best combined with other physical therapy such as strengthening, stretching, and other therapies.   What are the complications? While your therapist has had extensive training in minimizing the risks of trigger point dry needling, it is important to understand the  risks of any procedure.  Risks include bleeding, pain, fatigue, hematoma, infection, vertigo, nausea or nerve involvement. Monitor for any changes to your skin or sensation. Contact your therapist or MD with concerns.  A rare but serious complication is a pneumothorax over or near your middle and upper chest and back If you have dry needling in this area, monitor for the following symptoms: Shortness of breath on exertion and/or Difficulty taking a deep breath and/or Chest Pain and/or A dry cough If any of  the above symptoms develop, please go to the nearest emergency room or call 911. Tell them you had dry needling over your thorax and report any symptoms you are having. Please follow-up with your treating therapist after you complete the medical evaluation.  HOME EXERCISE PROGRAM: Access Code: [email or web address] URL: [email or web address] Date: 01/29/2024 Prepared by: Lamarr Citrin  Exercises - Seated Hamstring Stretch  - 1 x daily - 7 x weekly - 3 sets - 6 reps - 10s hold - Standing ITB Stretch  - 1 x daily - 7 x weekly - 1 sets - 6 reps - 10s hold - Single Leg Stance with Support  - 1 x daily - 7 x weekly - 1 sets - 10 reps - 30s hold - Side Stepping with Resistance at Ankles  - 1 x daily - 7 x weekly - 3 sets - 10 reps - Supine Quad Set  - 1 x daily - 7 x weekly - 2 sets - 10 reps - 10s hold - Sit to Stand  - 1 x daily - 7 x weekly - 2 sets - 10 reps - Heel Toe Raises with Counter Support  - 1 x daily - 7 x weekly - 3 sets - 10 reps  ASSESSMENT:  CLINICAL IMPRESSION: Pt continued TTP along HS medial and distal as well as quad with quad TDN performed and pistoning with medial HS in order to improve tolerance to seated position. Pt demonstrates good response to TDN as well as performed TFL stretching laterally for hip tightness management. Compiled HEP to one program with adaptations made and progressed strengthening exercises with GTB. Discussed continued PT pending progression in pain tolerance post TDN. Pt will continue to benefit from skilled therapy to progress towards goals.     Patient is a 78 y.o. female who was seen today for physical therapy evaluation and treatment for M25.561,G89.29 (ICD-10-CM) - Chronic pain of right knee T14.8XXA (ICD-10-CM) - Bone bruise M25.361 (ICD-10-CM) - Instability of knee joint, right. Patient demonstrates muscle weakness, reduced ROM, and fascial restrictions which are likely contributing to symptoms of pain and are negatively impacting patient  ability to perform ADLs and functional mobility tasks. Patient will benefit from skilled physical therapy services to address these deficits to reduce pain and improve level of function with ADLs and functional mobility tasks.   OBJECTIVE IMPAIRMENTS: Abnormal gait, decreased activity tolerance, decreased balance, decreased mobility, difficulty walking, decreased ROM, decreased strength, increased edema, increased fascial restrictions, impaired perceived functional ability, and pain.   ACTIVITY LIMITATIONS: standing, squatting, transfers, and locomotion level  PARTICIPATION LIMITATIONS: meal prep and shopping  REHAB POTENTIAL: Good  CLINICAL DECISION MAKING: Evolving/moderate complexity  EVALUATION COMPLEXITY: Moderate   GOALS: Goals reviewed with patient? Yes  SHORT TERM GOALS: Target date: 01/11/2024 patient will be independent with initial HEP  Baseline: Goal status: MET  2.  Patient will report 30% improvement overall  Baseline:  Goal status: INITIAL   LONG TERM GOALS: Target date: 01/25/2024  Patient will be  independent in self management strategies to improve quality of life and functional outcomes.  Baseline:  Goal status: PROGRESSING  2.  Patient will report 50% improvement overall  Baseline:  Goal status: INITIAL  3.  Patient will able to stand SLS on each leg x 10 to demonstrate improved functional balance  Baseline:  Goal status: INITIAL  4.  Patient will improve 5 times sit to stand score to 15 sec or less to demonstrate improved functional mobility and increased leg strength.    Baseline: 17.34 Goal status: INITIAL  5.  Patient will increase knee mobility to 0 to 120 to promote normal navigation of steps; step over step pattern  Baseline:  Goal status: PROGRESSING  6.   Patient will increase right leg MMT's to 5/5 to allow navigation of steps without gait deviation or loss of balance  Baseline:  Goal status: INITIAL  PLAN:  PT FREQUENCY:  2x/week  PT DURATION: 4 weeks  PLANNED INTERVENTIONS: 97164- PT Re-evaluation, 97110-Therapeutic exercises, 97530- Therapeutic activity, 97112- Neuromuscular re-education, 97535- Self Care, 02859- Manual therapy, [id number]- Gait training, [phone]- Orthotic Fit/training, [phone]- Canalith repositioning, [id number]- Aquatic Therapy, 97760- Splinting, [phone]- Wound care (first 20 sq cm), 97598- Wound care (each additional 20 sq cm)Patient/Family education, Balance training, Stair training, Taping, Dry Needling, Joint mobilization, Joint manipulation, Spinal manipulation, Spinal mobilization, Scar mobilization, and DME instructions.   PLAN FOR NEXT SESSION: continue to improve Rt knee mobility and strength while reducing pain.  TDN as needed. Reassess visits/goals next session  Lamarr LITTIE Citrin PT, DPT [facility] [place] [phone] office   12:22 PM, 01/29/24

## 2024-01-31 ENCOUNTER — Ambulatory Visit (HOSPITAL_COMMUNITY)

## 2024-01-31 DIAGNOSIS — R262 Difficulty in walking, not elsewhere classified: Secondary | ICD-10-CM

## 2024-01-31 DIAGNOSIS — Z7409 Other reduced mobility: Secondary | ICD-10-CM | POA: Diagnosis not present

## 2024-01-31 DIAGNOSIS — M25561 Pain in right knee: Secondary | ICD-10-CM

## 2024-01-31 NOTE — Therapy (Signed)
 OUTPATIENT PHYSICAL THERAPY LOWER EXTREMITY TREATMENT/PROGRESS NOTE Progress Note Reporting Period 12/28/2023 to 01/31/2024  See note below for Objective Data and Assessment of Progress/Goals.   PHYSICAL THERAPY DISCHARGE SUMMARY  Visits from Start of Care: 5  Current functional level related to goals / functional outcomes: See below   Remaining deficits: See below   Education / Equipment: HEP   Patient agrees to discharge. Patient goals were partially met. Patient is being discharged due to being pleased with the current functional level.       Patient Name: Kara [name] MRN: [id number] DOB:1946-03-14, 78 y.o., female Today's Date: 01/31/2024  END OF SESSION:  PT End of Session - 01/31/24 1058     Visit Number 5    Number of Visits 8    Date for Recertification  01/25/24    Authorization Type Medicare / BCBS secondary    Progress Note Due on Visit 10    PT Start Time 1100    PT Stop Time 1140    PT Time Calculation (min) 40 min    Activity Tolerance Patient tolerated treatment well    Behavior During Therapy [facility] for tasks assessed/performed             Past Medical History:  Diagnosis Date   Allergy     food allergies, drug allergies   Anemia    Anxiety    Arthritis    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  The STAT Breast cancer panel offered by [facility] includes sequencing and rearrangement analysis for the following 9 genes:  ATM, BRCA1, BRCA2, CDH1, CHEK2, PALB2, PTEN, STK11 and TP53.   The report date is  August 09, 2019.   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 fibroids   BIOPSY  05/02/2022   Procedure: BIOPSY;  Surgeon: Cindie Carlin POUR, DO;  [facility]: [facility];  Service: Endoscopy;;   BREAST BIOPSY Left 05/2019   left breast DCIS   BREAST LUMPECTOMY Left 06/2019   DCIS   BREAST LUMPECTOMY WITH RADIOACTIVE SEED LOCALIZATION Left 07/08/2019   Procedure: LEFT BREAST LUMPECTOMY WITH RADIOACTIVE SEED LOCALIZATION;  Surgeon:  Curvin Deward MOULD, MD;  Location: [facility];  Service: General;  Laterality: Left;   BREAST SURGERY N/A    Phreesia 08/30/2020   CESAREAN SECTION     CESAREAN SECTION N/A    [employer]reesia 08/30/2020   CHOLECYSTECTOMY     COLONOSCOPY  2011   [place] : two 3-4 mm sessile polyps, hyperplastic, sigmoid diverticula, TI appeared normal.    COLONOSCOPY WITH PROPOFOL  N/A 05/02/2022   Procedure: COLONOSCOPY WITH PROPOFOL ;  Surgeon: Cindie Carlin POUR, DO;  Location: [facility];  Service: Endoscopy;  Laterality: N/A;  11:30 am   ESOPHAGOGASTRODUODENOSCOPY  2011   [place] : non-bleeding erosive gastropathy, normal duodenum. path:  unremarkable duodenum, negative H.pylori, minimal esophagitis    ESOPHAGOGASTRODUODENOSCOPY N/A 11/23/2022   Procedure: ESOPHAGOGASTRODUODENOSCOPY (EGD);  Surgeon: Wilhelmenia Aloha Raddle., MD;  Location: [facility];  Service: Gastroenterology;  Laterality: N/A;   ESOPHAGOGASTRODUODENOSCOPY (EGD) WITH PROPOFOL  N/A 05/02/2022   Procedure: ESOPHAGOGASTRODUODENOSCOPY (EGD) WITH PROPOFOL ;  Surgeon: Cindie Carlin POUR, DO;  [facility];  Service: Endoscopy;  Laterality: N/A;   EUS N/A 11/23/2022   Procedure: UPPER ENDOSCOPIC ULTRASOUND (EUS) RADIAL;  Surgeon: Wilhelmenia Aloha Raddle., MD;  Location: [facility];  Service: Gastroenterology;  Laterality: N/A;   EYE SURGERY  2022   IR RADIOLOGIST EVAL & MGMT  06/20/2023   POLYPECTOMY  05/02/2022   Procedure: POLYPECTOMY;  Surgeon: Cindie Carlin POUR, DO;  [facility]: [facility];  Service: Endoscopy;;   REFRACTIVE SURGERY  2024   TONSILLECTOMY  1975   Patient Active Problem List   Diagnosis Date Noted   History of anaphylaxis 11/05/2023   Class 2 severe obesity due to excess calories with serious comorbidity and body mass index (BMI) of 35.0 to 35.9 in adult (HCC) 07/10/2023   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   Obesity (BMI 30-39.9) 05/24/2023   OSA (obstructive sleep apnea) 07/21/2022   H/o Lyme disease 07/21/2022   Hypotension 07/03/2022   Idiopathic peripheral neuropathy 04/12/2022   RUQ pain 04/05/2022   Encounter for screening colonoscopy 04/05/2022   Leg swelling 05/25/2021   Vitamin D  deficiency 03/11/2021   Plantar fasciitis of right foot 10/13/2020   Trigger middle finger of right hand 10/13/2020   Intermittent palpitations 04/29/2020   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   Shortness of breath 02/04/2020   COVID-19 01/14/2020   Trochanteric bursitis of left hip 12/30/2019   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   Hypothyroidism following radioiodine therapy 03/06/2019    Prediabetes 08/30/2017   Mixed hyperlipidemia 08/23/2016   GERD (gastroesophageal reflux disease) 08/14/2016    PCP: Tobie Suzzane POUR, MD  REFERRING PROVIDER: Onesimo Oneil LABOR, MD  REFERRING DIAG: [phone] (ICD-10-CM) - Chronic pain of right knee T14.8XXA (ICD-10-CM) - Bone bruise M25.361 (ICD-10-CM) - Instability of knee joint, right  THERAPY DIAG:  Right knee pain, unspecified chronicity  Difficulty in walking, not elsewhere classified  Impaired functional mobility, balance, gait, and endurance  Rationale for Evaluation and Treatment: Rehabilitation  ONSET DATE: pain since Nov of 2024  SUBJECTIVE:   SUBJECTIVE STATEMENT: Feels she is about 50% to 60% better overall; less pain and feels she is moving better; a little sore after dry needling last session.     Evaluation:  Right knee pain started hurting in Nov; was told she had an infection per MRI top of tibia but was not given antibiotic; was told to stay off the knee so she has periodically been using RW or cane off and  on.  Has affected the way she walks and this makes her back hurt.  She is favoring that side and sometimes that right knee will give way.  Has seen Dr Onesimo 3 times; given an injection on first visit and that did not help at all.  Also had a second opinion in [place]  but has not heard back from them.  That MD wanted her to have a visco supplementation injection.  Referred to therapy.    PERTINENT HISTORY: Lymphedema bilateral Lower extremities  PAIN:  Are you having pain? Yes: NPRS scale: 0/10; 7/10 at worst Pain location: inside and across the front Pain description: gives way Aggravating factors: gives way Relieving factors: rest; ice  PRECAUTIONS: None  RED FLAGS: None   WEIGHT BEARING RESTRICTIONS: No  FALLS:  Has patient fallen in last 6 months? No  OCCUPATION: retired  PLOF: Independent  PATIENT GOALS: get more strength back in my legs and be more stable; be able to walk more; walk  again  NEXT MD VISIT: Sept  OBJECTIVE:  Note: Objective measures were completed at Evaluation unless otherwise noted.  DIAGNOSTIC FINDINGS:   CLINICAL DATA:  Meniscal injury, knee   Right knee instability for 3 months. No known injury or prior relevant surgery.   EXAM: MRI OF THE RIGHT KNEE WITHOUT CONTRAST   TECHNIQUE: Multiplanar, multisequence MR imaging of the knee was performed. No intravenous contrast was administered.   COMPARISON:  Radiographs 02/13/2023.  MRI 08/16/2019.   FINDINGS: MENISCI   Medial meniscus:  Intact with normal morphology.   Lateral meniscus: On the previous MRI, there was an apparent large parameniscal cyst peripherally which is no longer present. The lateral meniscus appears degenerated with mild free edge fraying, although no discrete meniscal tear or displaced meniscal fragment is demonstrated.   LIGAMENTS   Cruciates: The anterior and posterior cruciate ligaments are intact.   Collaterals: The medial and lateral collateral ligament complexes are intact.   CARTILAGE   Patellofemoral:  Preserved.   Medial: Mild to moderate chondral thinning and surface irregularity. No full-thickness chondral defect.   Lateral: Mild to moderate chondral thinning and surface irregularity. No full-thickness chondral defect.   MISCELLANEOUS   Joint:  Small joint effusion.   Popliteal Fossa: The popliteus muscle and tendon are intact. Small Baker's cyst.   Extensor Mechanism: Stable mild proximal patellar tendinosis. The quadriceps tendon appears normal. The patellar retinacula are intact.   Bones: New prominent marrow edema throughout the lateral tibial plateau, greatest posteriorly. Questionable underlying subchondral insufficiency fracture, best seen on the sagittal images. Mild surrounding soft tissue edema. No other significant osseous abnormalities are identified.   Other: No other significant periarticular soft tissue findings.    IMPRESSION: 1. New prominent marrow edema throughout the lateral tibial plateau, greatest posteriorly, with questionable underlying subchondral insufficiency fracture. 2. The lateral meniscus appears degenerated with mild free edge fraying, although no discrete meniscal tear or displaced meniscal fragment is demonstrated. No residual parameniscal cyst is seen. 3. The medial meniscus, cruciate and collateral ligaments are intact. 4. 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 5. Small joint effusion and small Baker's cyst.     Electronically Signed   By: Elsie Perone M.D.   On: 07/19/2023 15:27  PATIENT SURVEYS:  LEFS  Extreme difficulty/unable (0), Quite a bit of difficulty (1), Moderate difficulty (2), Little difficulty (3), No difficulty (4) Survey date:    Score total:  50/80; 62.5%     COGNITION: Overall cognitive status: Within  functional limits for tasks assessed     SENSATION: None currently  EDEMA:  Both legs swell; lymphedema both legs   POSTURE: rounded shoulders and forward head  PALPATION: General soreness  LOWER EXTREMITY MMTS  MMTs Right eval Left eval Right 01/31/24 Left 01/31/24  Hip flexion 3+ 4+ 5 5  Hip extension      Hip abduction      Hip adduction      Hip internal rotation      Hip external rotation      Knee flexion   5 5  Knee extension 4- 4+ 5 5  Ankle dorsiflexion 4- 4- 5 5  Ankle plantarflexion      Ankle inversion      Ankle eversion       (Blank rows = not tested)  LOWER EXTREMITY ROM  ROM Right eval Left eval Right 01/31/24  Hip flexion     Hip extension     Hip abduction     Hip adduction     Hip internal rotation     Hip external rotation     Knee flexion 105  116  Knee extension -5 0 -2  Ankle dorsiflexion     Ankle plantarflexion     Ankle inversion     Ankle eversion      (Blank rows = not tested)    FUNCTIONAL TESTS:  5 times sit to stand: 17.34  sec hands on  knees Timed up and go (TUG): next visit 2 minute walk test: next visit SLS right 5 and left 1  GAIT: Distance walked: 60 ft in clinic Assistive device utilized: None Level of assistance: Complete Independence Comments: slow gait speed; cautious                                                                                                                                TREATMENT DATE:  01/31/24 Progress note 5 times sit to stand 13.49 sec SLS right 4 and left 2 AROM and MMT's right knee see above LEFS 64/80 80%  01/29/24    Educated on continuing to perform stretches and compiled HEP together to optimize potential for progress and tolerance to mobility  Recumbent bike for improving functional engagement LE mm   01/17/24 Trigger Point Dry Needling  Initial Treatment: Pt instructed on Dry Needling rational, procedures, and possible side effects. Pt instructed to expect mild to moderate muscle soreness later in the day and/or into the next day.  Pt instructed in methods to reduce muscle soreness. Pt instructed to continue prescribed HEP. Patient was educated on signs and symptoms of infection and other risk factors and advised to seek medical attention should they occur.  Patient verbalized understanding of these instructions and education.   Patient Verbal Consent Given: Yes Education Handout Provided: Previously Provided Muscles Treated: quads and hamstrings R LE Electrical Stimulation Performed: No Treatment Response/Outcome: twitch elicitted (HS); aching and soreness; improved tolerance to  mobility/STM and WB  LAQ seated with end range quad/HS activation post DN Education and performance of HS and gastroc stretch secondary to increased tightness/soreness over aspect while performing recumbent stepper in order to improve blood flow/perfusion post dry needling performance    01/10/2024  On 20Ft line, Lateral stepping 2RT with GTB around ankles, pt cued for  slow/controlled movement  On 88ft line, Monster walks, 2RT with GTB at ankles, pt cued for larger/wider steps Stationary Bike, 5 minutes, level 3 resistance Cybex hamstring curl, 3 sets of 10 reps, 4 plates, pt cued for eccentric control with RLE [employer] Leg press, 3 sets of 10 reps, 5 plates, pt cued for eccentric control Standing with bil UE assist: alternating high hold marches 2X10  Hip abduction 2X10 each  Hip extension 2X10 each    01/08/2024  Therapeutic Exercise: -Stationary Bike, 5 minutes, level 3 resistance -Seated hamstring curl, 3 sets of 10 reps, plate 4>plate 5, pt cued for eccentric control with RLE -Leg press, 3 sets of 10 reps, palte 3>4>5, pt cued for eccentric control -Lateral stepping 2 laps 10 steps per lap, with RTB around ankles, pt cued for slow/controlled movement  -Monster walks, RTB at ankles, 2 laps 10 steps each direction, pt cued for sequencing   12/28/23 physical therapy evaluation and HEP instruction    PATIENT EDUCATION:  Education details: Patient educated on exam findings, POC, scope of PT, HEP, and what to expect next visit. Person educated: Patient Education method: Explanation, Demonstration, and Handouts Education comprehension: verbalized understanding, returned demonstration, verbal cues required, and tactile cues required  Trigger Point Dry Needling  What is Trigger Point Dry Needling (DN)? DN is a physical therapy technique used to treat muscle pain and dysfunction. Specifically, DN helps deactivate muscle trigger points (muscle knots).  A thin filiform needle is used to penetrate the skin and stimulate the underlying trigger point. The goal is for a local twitch response (LTR) to occur and for the trigger point to relax. No medication of any kind is injected during the procedure.  Benefits of Trigger Point Dry Needling Reduces localized tension and stiffness promoting muscle function. Promotes range of motion and flexibility of the area  being treated. Relieves localized and referred muscle related pain.  Promotes localized blood flow. Benefits of DN increase when performed with formal therapy including strengthening and stretching.   What Does Trigger Point Dry Needling Feel Like?  The procedure feels different for each individual patient. Some patients report that they do not actually feel the needle enter the skin and overall the process is not painful. Very mild bleeding may occur. However, many patients feel a deep cramping in the muscle in which the needle was inserted. This is the local twitch response.   How Will I feel after the treatment? Soreness is normal, and the onset of soreness may not occur for a few hours. Typically this soreness does not last longer than two days.  Bruising is uncommon, however; ice can be used to decrease any possible bruising.  In rare cases feeling tired or nauseous after the treatment is normal. In addition, your symptoms may get worse before they get better, this period will typically not last longer than 24 hours.   What Can I do After My Treatment? Increase your hydration by drinking more water  for the next 24 hours.  You may place ice or heat on the areas treated that have become sore, however, do not use heat on inflamed or bruised areas. Heat often  brings more relief post needling. You can continue your regular activities, but vigorous activity is not recommended initially after the treatment for 24 hours. DN is best combined with other physical therapy such as strengthening, stretching, and other therapies.   What are the complications? While your therapist has had extensive training in minimizing the risks of trigger point dry needling, it is important to understand the risks of any procedure.  Risks include bleeding, pain, fatigue, hematoma, infection, vertigo, nausea or nerve involvement. Monitor for any changes to your skin or sensation. Contact your therapist or MD with  concerns.  A rare but serious complication is a pneumothorax over or near your middle and upper chest and back If you have dry needling in this area, monitor for the following symptoms: Shortness of breath on exertion and/or Difficulty taking a deep breath and/or Chest Pain and/or A dry cough If any of the above symptoms develop, please go to the nearest emergency room or call 911. Tell them you had dry needling over your thorax and report any symptoms you are having. Please follow-up with your treating therapist after you complete the medical evaluation.  HOME EXERCISE PROGRAM: Access Code: [email or web address] URL: [email or web address] Date: 01/29/2024 Prepared by: Lamarr Citrin  Exercises - Seated Hamstring Stretch  - 1 x daily - 7 x weekly - 3 sets - 6 reps - 10s hold - Standing ITB Stretch  - 1 x daily - 7 x weekly - 1 sets - 6 reps - 10s hold - Single Leg Stance with Support  - 1 x daily - 7 x weekly - 1 sets - 10 reps - 30s hold - Side Stepping with Resistance at Ankles  - 1 x daily - 7 x weekly - 3 sets - 10 reps - Supine Quad Set  - 1 x daily - 7 x weekly - 2 sets - 10 reps - 10s hold - Sit to Stand  - 1 x daily - 7 x weekly - 2 sets - 10 reps - Heel Toe Raises with Counter Support  - 1 x daily - 7 x weekly - 3 sets - 10 reps  ASSESSMENT:  CLINICAL IMPRESSION: Progress note; patient met 6/8 set rehab goals. Discussed with her continuing exercise after discharge; she is agreeable to discharge at this time.    Patient is a 78 y.o. female who was seen today for physical therapy evaluation and treatment for M25.561,G89.29 (ICD-10-CM) - Chronic pain of right knee T14.8XXA (ICD-10-CM) - Bone bruise M25.361 (ICD-10-CM) - Instability of knee joint, right. Patient demonstrates muscle weakness, reduced ROM, and fascial restrictions which are likely contributing to symptoms of pain and are negatively impacting patient ability to perform ADLs and functional mobility tasks. Patient will  benefit from skilled physical therapy services to address these deficits to reduce pain and improve level of function with ADLs and functional mobility tasks.   OBJECTIVE IMPAIRMENTS: Abnormal gait, decreased activity tolerance, decreased balance, decreased mobility, difficulty walking, decreased ROM, decreased strength, increased edema, increased fascial restrictions, impaired perceived functional ability, and pain.   ACTIVITY LIMITATIONS: standing, squatting, transfers, and locomotion level  PARTICIPATION LIMITATIONS: meal prep and shopping  REHAB POTENTIAL: Good  CLINICAL DECISION MAKING: Evolving/moderate complexity  EVALUATION COMPLEXITY: Moderate   GOALS: Goals reviewed with patient? Yes  SHORT TERM GOALS: Target date: 01/11/2024 patient will be independent with initial HEP  Baseline: Goal status: MET  2.  Patient will report 30% improvement overall  Baseline:  Goal status: met   LONG TERM GOALS:  Target date: 01/25/2024  Patient will be independent in self management strategies to improve quality of life and functional outcomes.  Baseline:  Goal status: met  2.  Patient will report 50% improvement overall  Baseline:  Goal status: met  3.  Patient will able to stand SLS on each leg x 10 to demonstrate improved functional balance  Baseline:  Goal status: INITIAL  4.  Patient will improve 5 times sit to stand score to 15 sec or less to demonstrate improved functional mobility and increased leg strength.    Baseline: 17.34 Goal status: met  5.  Patient will increase knee mobility to 0 to 120 to promote normal navigation of steps; step over step pattern  Baseline:  Goal status: PROGRESSING  6.   Patient will increase right leg MMT's to 5/5 to allow navigation of steps without gait deviation or loss of balance  Baseline:  Goal status: met  PLAN:  PT FREQUENCY: 2x/week  PT DURATION: 4 weeks  PLANNED INTERVENTIONS: 97164- PT Re-evaluation,  97110-Therapeutic exercises, 97530- Therapeutic activity, 97112- Neuromuscular re-education, 97535- Self Care, 02859- Manual therapy, [id number]- Gait training, [phone]- Orthotic Fit/training, [phone]- Canalith repositioning, [id number]- Aquatic Therapy, 97760- Splinting, [phone]- Wound care (first 20 sq cm), 97598- Wound care (each additional 20 sq cm)Patient/Family education, Balance training, Stair training, Taping, Dry Needling, Joint mobilization, Joint manipulation, Spinal manipulation, Spinal mobilization, Scar mobilization, and DME instructions.   PLAN FOR NEXT SESSION: discharge  11:00 AM, 01/31/24 Quantel Mcinturff Small Zamorah Ailes MPT [facility] [place] [phone]

## 2024-02-06 ENCOUNTER — Ambulatory Visit: Admitting: Orthopedic Surgery

## 2024-02-12 ENCOUNTER — Ambulatory Visit: Admitting: "Endocrinology

## 2024-02-19 ENCOUNTER — Other Ambulatory Visit (INDEPENDENT_AMBULATORY_CARE_PROVIDER_SITE_OTHER): Payer: Self-pay

## 2024-02-19 ENCOUNTER — Ambulatory Visit (INDEPENDENT_AMBULATORY_CARE_PROVIDER_SITE_OTHER): Admitting: Orthopedic Surgery

## 2024-02-19 DIAGNOSIS — M1712 Unilateral primary osteoarthritis, left knee: Secondary | ICD-10-CM | POA: Diagnosis not present

## 2024-02-19 DIAGNOSIS — M25562 Pain in left knee: Secondary | ICD-10-CM | POA: Diagnosis not present

## 2024-02-20 ENCOUNTER — Encounter: Payer: Self-pay | Admitting: Orthopedic Surgery

## 2024-02-20 NOTE — Progress Notes (Signed)
 Return patient Visit  Assessment: Kara [name] is a 78 y.o. female with the following: 1.  Acute pain of left knee; mild to moderate arthritis  Plan: [name] is now having similar type pain in the left knee.  Radiographs demonstrates some early degenerative changes.  This could be related to overloading the left knee, due to her recent right knee issues.  Provided reassurance.  She is to remain active.  She can consider dry needling for the left knee.  Will provide referral if she is interested.  Otherwise, follow-up as needed.   Follow-up: Return if symptoms worsen or fail to improve.  Subjective:  Chief Complaint  Patient presents with   Knee Pain    L thinks it may be due to compensation.     History of Present Illness: Kara [name] is a 78 y.o. female who returns for evaluation of left knee pain.  I have been following her for her right knee for several months.  She was diagnosed with an insufficiency fracture.  She has progressively developed worsening left knee pain.  She is concerned about overloading the left knee.  Medications have not been effective.  She notes that she had an excellent response to dry needling on the right knee.  She is wondering if this is a possibility for her left knee.   Review of Systems: No fevers or chills No numbness or tingling No chest pain No shortness of breath No bowel or bladder dysfunction No GI distress No headaches    Objective: There were no vitals taken for this visit.  Physical Exam:  General: Alert and oriented. and No acute distress. Gait: Slow, steady gait.  Left knee with mild swelling.  Tenderness to palpation medially.  Mild tenderness to palpation within the tibial plateau.  She has good range of motion.  Toes warm well-perfused.  IMAGING: I personally ordered and reviewed the following images   No new imaging obtained today.   New Medications:  No orders of the defined types were placed in  this encounter.     Oneil DELENA Horde, MD  02/20/2024 11:34 PM

## 2024-02-22 DIAGNOSIS — E782 Mixed hyperlipidemia: Secondary | ICD-10-CM | POA: Diagnosis not present

## 2024-02-22 DIAGNOSIS — E890 Postprocedural hypothyroidism: Secondary | ICD-10-CM | POA: Diagnosis not present

## 2024-02-22 DIAGNOSIS — R7303 Prediabetes: Secondary | ICD-10-CM | POA: Diagnosis not present

## 2024-02-23 LAB — LIPID PANEL
Chol/HDL Ratio: 3.9 ratio (ref 0.0–4.4)
Cholesterol, Total: 212 mg/dL — ABNORMAL HIGH (ref 100–199)
HDL: 54 mg/dL (ref >39)
LDL Chol Calc (NIH): 144 mg/dL — ABNORMAL HIGH (ref 0–99)
Triglycerides: 78 mg/dL (ref 0–149)
VLDL Cholesterol Cal: 14 mg/dL (ref 5–40)

## 2024-02-23 LAB — TSH: TSH: 4.66 u[IU]/mL — ABNORMAL HIGH (ref 0.450–4.500)

## 2024-02-23 LAB — T4, FREE: Free T4: 1.35 ng/dL (ref 0.82–1.77)

## 2024-02-23 LAB — COMPREHENSIVE METABOLIC PANEL WITH GFR
ALT: 18 IU/L (ref 0–32)
AST: 16 IU/L (ref 0–40)
Albumin: 3.9 g/dL (ref 3.8–4.8)
Alkaline Phosphatase: 131 IU/L (ref 49–135)
BUN/Creatinine Ratio: 18 (ref 12–28)
BUN: 15 mg/dL (ref 8–27)
Bilirubin Total: 0.5 mg/dL (ref 0.0–1.2)
CO2: 20 mmol/L (ref 20–29)
Calcium: 9.4 mg/dL (ref 8.7–10.3)
Chloride: 104 mmol/L (ref 96–106)
Creatinine, Ser: 0.82 mg/dL (ref 0.57–1.00)
Globulin, Total: 2.8 g/dL (ref 1.5–4.5)
Glucose: 119 mg/dL — ABNORMAL HIGH (ref 70–99)
Potassium: 4.4 mmol/L (ref 3.5–5.2)
Sodium: 139 mmol/L (ref 134–144)
Total Protein: 6.7 g/dL (ref 6.0–8.5)
eGFR: 74 mL/min/1.73 (ref >59)

## 2024-02-26 ENCOUNTER — Ambulatory Visit (INDEPENDENT_AMBULATORY_CARE_PROVIDER_SITE_OTHER): Admitting: "Endocrinology

## 2024-02-26 ENCOUNTER — Encounter: Payer: Self-pay | Admitting: "Endocrinology

## 2024-02-26 ENCOUNTER — Encounter: Payer: Self-pay | Admitting: Internal Medicine

## 2024-02-26 VITALS — BP 104/66 | HR 72 | Ht 66.0 in | Wt 230.0 lb

## 2024-02-26 DIAGNOSIS — E782 Mixed hyperlipidemia: Secondary | ICD-10-CM | POA: Diagnosis not present

## 2024-02-26 DIAGNOSIS — E890 Postprocedural hypothyroidism: Secondary | ICD-10-CM

## 2024-02-26 MED ORDER — SYNTHROID 112 MCG PO TABS: 112.0000 ug | ORAL_TABLET | Freq: Every day | ORAL | 1 refills | Status: DC

## 2024-02-26 NOTE — Progress Notes (Signed)
 02/26/2024, 3:20 PM                         Endocrinology follow-up note   Kara [name] is a 78 y.o.-year-old female patient being seen in follow-up for management of  RAI induced hypothyroidism referred by Tobie Suzzane POUR, MD.   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  The STAT Breast cancer panel offered by [facility] includes sequencing and rearrangement analysis for the following 9 genes:  ATM, BRCA1, BRCA2, CDH1, CHEK2, PALB2, PTEN, STK11 and TP53.   The report date is August 09, 2019.   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 fibroids   BIOPSY  05/02/2022   Procedure: BIOPSY;  Surgeon: Cindie Carlin POUR, DO;  Location: [facility];  Service: Endoscopy;;   BREAST BIOPSY Left 05/2019   left breast DCIS   BREAST LUMPECTOMY Left 06/2019   DCIS   BREAST LUMPECTOMY WITH RADIOACTIVE SEED LOCALIZATION Left 07/08/2019   Procedure: LEFT BREAST LUMPECTOMY WITH RADIOACTIVE SEED LOCALIZATION;  Surgeon: Curvin Deward MOULD, MD;  Location: [facility];  Service: General;  Laterality: Left;   BREAST SURGERY N/A    Phreesia 08/30/2020   CESAREAN SECTION     CESAREAN SECTION N/A  [employer]ia 08/30/2020   CHOLECYSTECTOMY     COLONOSCOPY  2011   [place] : two 3-4 mm sessile polyps, hyperplastic, sigmoid diverticula, TI appeared normal.    COLONOSCOPY WITH PROPOFOL  N/A 05/02/2022   Procedure: COLONOSCOPY WITH PROPOFOL ;  Surgeon: Cindie Carlin POUR, DO;  Location: [facility];  Service: Endoscopy;  Laterality: N/A;  11:30 am   ESOPHAGOGASTRODUODENOSCOPY  2011   [place] : non-bleeding erosive gastropathy, normal duodenum. path: unremarkable duodenum, negative H.pylori, minimal esophagitis    ESOPHAGOGASTRODUODENOSCOPY N/A 11/23/2022   Procedure: ESOPHAGOGASTRODUODENOSCOPY (EGD);  Surgeon: Wilhelmenia Aloha Raddle., MD;  Location: [facility];  Service: Gastroenterology;  Laterality: N/A;   ESOPHAGOGASTRODUODENOSCOPY (EGD) WITH PROPOFOL  N/A 05/02/2022   Procedure: ESOPHAGOGASTRODUODENOSCOPY (EGD) WITH PROPOFOL ;  Surgeon: Cindie Carlin POUR, DO;  Location: [facility];  Service: Endoscopy;  Laterality: N/A;   EUS N/A 11/23/2022   Procedure: UPPER ENDOSCOPIC ULTRASOUND (EUS) RADIAL;  Surgeon: Wilhelmenia Aloha Raddle., MD;  Location: [facility];  Service: Gastroenterology;  Laterality: N/A;   EYE SURGERY  2022   IR RADIOLOGIST EVAL & MGMT  06/20/2023   POLYPECTOMY  05/02/2022    Procedure: POLYPECTOMY;  Surgeon: Cindie Carlin POUR, DO;  Location: [facility];  Service: Endoscopy;;   REFRACTIVE SURGERY  2024   TONSILLECTOMY  1975    Social History   Socioeconomic History   Marital status: Divorced    Spouse name: Not on file   Number of children: 2   Years of education: 15   Highest education level: Some college, no degree  Occupational History   Occupation: retired    Comment: Loss adjuster, chartered at a Database administrator  Tobacco Use   Smoking status: Former    Current packs/day: 0.00    Average packs/day: 0.5 packs/day for 15.0 years (7.5 ttl pk-yrs)    Types: Cigarettes    Start date: 06/16/1983    Quit date: 06/15/1998    Years since quitting: 25.7   Smokeless tobacco: Never   Tobacco comments:    quit 2002  Vaping Use   Vaping status: Never Used  Substance and Sexual Activity   Alcohol use: No   Drug use: Never   Sexual activity: Not Currently    Birth control/protection: I.U.D., Pill, Surgical, None    Comment: hyst  Other Topics Concern   Not on file  Social History Narrative   Lives alone   2 grown children- one in [place] and one here   Grandchildren       Moved to [place] from [place] for aging mother who is 32 with Alzheimers      Maternal Aunt is Animator of the [employer] cancer call line      Enjoy: gym, time with friends       Diet: eats all food groups   Caffeine: 2 diet cokes daily    Water : 2-4 cups daily       Wears seat belt    Does not use phone while driving    Smoke and carbon detectors at home   [employer]    No weapons       Right Handed    Lives alone in a one story home    Retired   Social Drivers of Corporate investment banker Strain: Low Risk  (12/21/2023)   Overall Financial Resource Strain (CARDIA)    Difficulty of Paying Living Expenses: Not hard at all  Food Insecurity: No Food Insecurity (12/21/2023)   Hunger Vital Sign    Worried  About Running Out of Food in the Last Year:  Never true    Ran Out of Food in the Last Year: Never true  Transportation Needs: No Transportation Needs (12/21/2023)   PRAPARE - Administrator, Civil Service (Medical): No    Lack of Transportation (Non-Medical): No  Physical Activity: Sufficiently Active (12/21/2023)   Exercise Vital Sign    Days of Exercise per Week: 7 days    Minutes of Exercise per Session: 30 min  Stress: No Stress Concern Present (12/21/2023)   [employer] of Occupational Health - Occupational Stress Questionnaire    Feeling of Stress: Not at all  Social Connections: Moderately Isolated (12/21/2023)   Social Connection and Isolation Panel    Frequency of Communication with Friends and Family: More than three times a week    Frequency of Social Gatherings with Friends and Family: More than three times a week    Attends Religious Services: Patient declined    Database administrator or Organizations: Yes    Attends Banker Meetings: 1 to 4 times per year    Marital Status: Divorced    Family History  Problem Relation Age of Onset   Breast cancer Maternal Grandmother        dx over 34   Hyperlipidemia Maternal Grandmother    Prostate cancer Maternal Grandfather    Arthritis Mother    COPD Mother    Breast cancer Mother 16       second at age 21   Hearing loss Mother    Cancer Father        throat   Stroke Father    Cancer Maternal Aunt 38       [name], cervical cancer   Breast cancer Maternal Aunt        dx over 105   Breast cancer Paternal Aunt        dx under 50   Prostate cancer Maternal Uncle        dx over 50   Cancer Other        father's maternal cousin was Henreietta Lacks, Cervical   Cancer Maternal Uncle        unknown cancer   Breast cancer Paternal Aunt        dx over 50   Colon cancer Neg Hx    Allergic rhinitis Neg Hx    Angioedema Neg Hx    Asthma Neg Hx    Atopy Neg Hx    Eczema Neg Hx    Urticaria Neg Hx    Immunodeficiency Neg Hx      Outpatient Encounter Medications as of 02/26/2024  Medication Sig   acetaminophen  (TYLENOL ) 500 MG tablet Take 500 mg by mouth every 8 (eight) hours as needed for moderate pain (pain score 4-6).   albuterol  (VENTOLIN  HFA) 108 (90 Base) MCG/ACT inhaler Inhale 1 puff into the lungs every 6 (six) hours as needed for wheezing or shortness of breath.   Carboxymethylcell-Glycerin PF (REFRESH RELIEVA PF) 0.5-1 % SOLN Place 1 drop into both eyes in the morning, at noon, in the evening, and at bedtime.   EPINEPHrine  0.3 mg/0.3 mL IJ SOAJ injection Inject 0.3 mg into the muscle as needed for anaphylaxis.   ibuprofen  (ADVIL ) 100 MG/5ML suspension Take 200 mg by mouth every 4 (four) hours as needed.   latanoprost (XALATAN) 0.005 % ophthalmic solution Place 1 drop into both eyes at bedtime.   Misc. Devices MISC Compression stockings XL: 20-30  mm Hg   pantoprazole  (PROTONIX ) 40 MG tablet Take 1 tablet (40 mg total) by mouth daily.   SYNTHROID  100 MCG tablet TAKE 1 TABLET(100 MCG) BY MOUTH DAILY BEFORE BREAKFAST   Thiamine  HCl (VITAMIN B-1 PO) Take 1 tablet by mouth in the morning.   No facility-administered encounter medications on file as of 02/26/2024.    ALLERGIES: Allergies  Allergen Reactions   Coconut (Cocos Nucifera) Anaphylaxis   Other Shortness Of Breath    Covid booster pfizer     Prednisone  Hypertension    Severe headache   Iodinated Contrast Media Swelling    Pt reported throat swelling post-procedure that resolved with Benadryl  at home   Petrolatum Dermatitis    Caused incision irritation    VACCINATION STATUS: Immunization History  Administered Date(s) Administered    sv, Bivalent, Protein Subunit Rsvpref,pf (Abrysvo) 03/14/2023   Fluad Quad(high Dose 65+) 02/11/2020, 01/20/2021, 01/25/2022   Fluad Trivalent(High Dose 65+) 01/22/2023   INFLUENZA, HIGH DOSE SEASONAL PF 02/08/2018, 02/17/2019   Influenza,inj,Quad PF,6+ Mos 02/28/2017   Influenza-Unspecified 02/28/2017    Moderna Covid-19 Vaccine Bivalent Booster 59yrs & up 02/03/2022   PFIZER(Purple Top)SARS-COV-2 Vaccination 06/06/2019, 06/24/2019, 04/21/2020, 10/07/2020, 02/22/2021   Pneumococcal Conjugate-13 07/05/2016   Pneumococcal Polysaccharide-23 04/29/2019   Td 12/23/2018   Unspecified SARS-COV-2 Vaccination 04/30/2023   Zoster Recombinant(Shingrix) 06/06/2021    HPI    Kara [name]  is a patient with the above medical history. she was diagnosed  with hyperthyroidism at approximate age of 33 years  which required I-131 thyroid  ablation in environment.   -She was subsequently initiated on thyroid  hormone supplement.  She is currently on Synthroid  100 mcg p.o. daily before breakfast.     She reports compliance and consistency with her medication.  Her previous thyroid  function test reveals slight under replacement.    She has prediabetes not on medication.  Her recent A1c was 6.1%.  She has dyslipidemia not on treatment.   -She presents with steady weight since last visit.  She denies tremors, palpitations. Pt denies feeling nodules in neck, hoarseness, dysphagia/odynophagia, SOB with lying down.  she denies family history of  thyroid  disorders.  No family history of thyroid  cancer.  Her father had head and neck tumor which did not appear to be related to thyroid  cancer.  ROS:  Limited as above.   Physical Exam: BP 104/66   Pulse 72   Ht 5' 6 (1.676 m)   Wt 230 lb (104.3 kg)   BMI 37.12 kg/m  Wt Readings from Last 3 Encounters:  02/26/24 230 lb (104.3 kg)  01/09/24 228 lb 12.8 oz (103.8 kg)  12/31/23 229 lb 12.8 oz (104.2 kg)    CMP     Component Value Date/Time   NA 139 02/22/2024 0932   K 4.4 02/22/2024 0932   CL 104 02/22/2024 0932   CO2 20 02/22/2024 0932   GLUCOSE 119 (H) 02/22/2024 0932   GLUCOSE 104 (H) 10/16/2023 1407   BUN 15 02/22/2024 0932   CREATININE 0.82 02/22/2024 0932   CREATININE 0.75 03/08/2020 0935   CALCIUM 9.4 02/22/2024 0932   PROT 6.7  02/22/2024 0932   ALBUMIN 3.9 02/22/2024 0932   AST 16 02/22/2024 0932   ALT 18 02/22/2024 0932   ALKPHOS 131 02/22/2024 0932   BILITOT 0.5 02/22/2024 0932   GFRNONAA 56 (L) 10/16/2023 1407   GFRNONAA 70 10/22/2019 0958   GFRAA 82 10/22/2019 0958    Diabetic Labs (most recent): Lab Results  Component Value  Date   HGBA1C 6.4 10/09/2023   HGBA1C 6.2 07/10/2023   HGBA1C 6.2 01/24/2023   MICROALBUR 0.5 08/16/2017   MICROALBUR 0.3 02/19/2017    Lipid Panel     Component Value Date/Time   CHOL 212 (H) 02/22/2024 0932   TRIG 78 02/22/2024 0932   HDL 54 02/22/2024 0932   CHOLHDL 3.9 02/22/2024 0932   CHOLHDL 3.0 10/22/2019 0958   VLDL 15 11/24/2016 1018   LDLCALC 144 (H) 02/22/2024 0932   LDLCALC 114 (H) 10/22/2019 0958   LABVLDL 14 02/22/2024 0932     Lab Results  Component Value Date   TSH 4.660 (H) 02/22/2024   TSH 4.900 (H) 09/13/2023   TSH 2.030 07/02/2023   TSH 2.510 06/13/2023   TSH 7.520 (H) 05/03/2023   TSH 0.454 01/16/2023   TSH 0.288 (L) 10/13/2022   TSH 4.420 04/13/2022   TSH 2.720 01/09/2022   TSH 0.980 07/07/2021   FREET4 1.35 02/22/2024   FREET4 1.47 09/13/2023   FREET4 1.78 (H) 07/02/2023   FREET4 1.74 06/13/2023   FREET4 1.23 05/03/2023   FREET4 1.80 (H) 01/16/2023   FREET4 1.91 (H) 10/13/2022   FREET4 1.32 04/13/2022   FREET4 1.35 01/09/2022   FREET4 1.52 07/07/2021       ASSESSMENT: 1. Hypothyroidism- RAI induced 2. Hyperlipidemia 3.  Prediabetes 4. Vitamin d  deficiency   PLAN:   Her previsit thyroid  function tests are consistent with slight under replacement.  I advised her to increase her Synthroid  to 112 mcg p.o. daily before breakfast.      - We discussed about the correct intake of her thyroid  hormone, on empty stomach at fasting, with water , separated by at least 30 minutes from breakfast and other medications,  and separated by more than 4 hours from calcium, iron, multivitamins, acid reflux medications (PPIs). -Patient is  made aware of the fact that thyroid  hormone replacement is needed for life, dose to be adjusted by periodic monitoring of thyroid  function tests.     -Due to absence of clinical goiter, no need for thyroid  ultrasound. For her prediabetes, she was given a prescription for metformin  500 mg daily, however patient did not take this medication.   She also did not accept statins for dyslipidemia.   She is advised to maintain close follow-up with her PCP.    I spent  22  minutes in the care of the patient today including review of labs from Thyroid  Function, CMP, and other relevant labs ; imaging/biopsy records (current and previous including abstractions from other facilities); face-to-face time discussing  her lab results and symptoms, medications doses, her options of short and long term treatment based on the latest standards of care / guidelines;   and documenting the encounter.  Kara [name]  participated in the discussions, expressed understanding, and voiced agreement with the above plans.  All questions were answered to her satisfaction. she is encouraged to contact clinic should she have any questions or concerns prior to her return visit.    Return in about 6 months (around 08/26/2024) for F/U with Pre-visit Labs.  Ranny Earl, MD [facility] [address] [place], [place] [address] Phone: [phone]  Fax: [phone]   02/26/2024, 3:20 PM  This note was partially dictated with voice recognition software. Similar sounding words can be transcribed inadequately or may not  be corrected upon review.

## 2024-02-27 ENCOUNTER — Other Ambulatory Visit: Payer: Self-pay

## 2024-02-27 DIAGNOSIS — E890 Postprocedural hypothyroidism: Secondary | ICD-10-CM

## 2024-02-27 MED ORDER — SYNTHROID 112 MCG PO TABS: 112.0000 ug | ORAL_TABLET | Freq: Every day | ORAL | 1 refills | Status: DC

## 2024-03-04 ENCOUNTER — Encounter: Payer: Self-pay | Admitting: Internal Medicine

## 2024-03-24 ENCOUNTER — Other Ambulatory Visit: Payer: Self-pay | Admitting: Internal Medicine

## 2024-03-24 DIAGNOSIS — G4733 Obstructive sleep apnea (adult) (pediatric): Secondary | ICD-10-CM

## 2024-03-24 MED ORDER — ZEPBOUND 5 MG/0.5ML ~~LOC~~ SOAJ: 5.0000 mg | SUBCUTANEOUS | 1 refills | Status: DC

## 2024-03-27 ENCOUNTER — Other Ambulatory Visit: Payer: Self-pay | Admitting: "Endocrinology

## 2024-03-27 DIAGNOSIS — E890 Postprocedural hypothyroidism: Secondary | ICD-10-CM

## 2024-04-01 ENCOUNTER — Encounter: Payer: Self-pay | Admitting: Dermatology

## 2024-04-01 ENCOUNTER — Ambulatory Visit (INDEPENDENT_AMBULATORY_CARE_PROVIDER_SITE_OTHER): Admitting: Dermatology

## 2024-04-01 DIAGNOSIS — L821 Other seborrheic keratosis: Secondary | ICD-10-CM

## 2024-04-01 DIAGNOSIS — L989 Disorder of the skin and subcutaneous tissue, unspecified: Secondary | ICD-10-CM

## 2024-04-01 DIAGNOSIS — L219 Seborrheic dermatitis, unspecified: Secondary | ICD-10-CM

## 2024-04-01 DIAGNOSIS — L820 Inflamed seborrheic keratosis: Secondary | ICD-10-CM

## 2024-04-01 DIAGNOSIS — R208 Other disturbances of skin sensation: Secondary | ICD-10-CM

## 2024-04-01 DIAGNOSIS — L249 Irritant contact dermatitis, unspecified cause: Secondary | ICD-10-CM

## 2024-04-01 MED ORDER — FLUOCINOLONE ACETONIDE BODY 0.01 % EX OIL: 1.0000 [IU] | TOPICAL_OIL | Freq: Two times a day (BID) | CUTANEOUS | 5 refills | Status: AC

## 2024-04-01 MED ORDER — CLOBETASOL PROPIONATE 0.05 % EX SOLN: 1.0000 | Freq: Two times a day (BID) | CUTANEOUS | 1 refills | Status: AC

## 2024-04-01 NOTE — Patient Instructions (Addendum)

## 2024-04-01 NOTE — Progress Notes (Unsigned)
   New Patient Visit   Subjective  Kara [name] is a 78 y.o. female who presents for the following: itciness  Pt has had itchiness for a long time that she'd like evaluated.  The following portions of the chart were reviewed this encounter and updated as appropriate: medications, allergies, medical history  Review of Systems:  No other skin or systemic complaints except as noted in HPI or Assessment and Plan.  Objective  Well appearing patient in no apparent distress; mood and affect are within normal limits.  A focused examination was performed of the following areas: scalp  Relevant exam findings are noted in the Assessment and Plan.    Assessment & Plan   SEBORRHEIC KERATOSIS - Stuck-on, waxy, tan-brown papules and/or plaques  - Benign-appearing - Discussed benign etiology and prognosis. - Observe - Call for any changes  SEBORRHEIC DERMATITIS Exam: Pink patches with greasy scale at scalp  Seborrheic Dermatitis is a chronic persistent rash characterized by pinkness and scaling most commonly of the mid face but also can occur on the scalp (dandruff), ears; mid chest, mid back and groin.  It tends to be exacerbated by stress and cooler weather.  People who have neurologic disease may experience new onset or exacerbation of existing seborrheic dermatitis.  The condition is not curable but treatable and can be controlled.  Treatment Plan: Clobetasol solution to scalp prn    Fluocinolone oil to scalp prn  IRRITANT DERMATITIS Left Breast Destruction of lesion - Left Breast  Destruction method: cryotherapy     Return in about 2 months (around 06/01/2024) for spot on chest.  I, Darice Smock, CMA, am acting as scribe for RUFUS CHRISTELLA HOLY, MD.   Documentation: I have reviewed the above documentation for accuracy and completeness, and I agree with the above.  RUFUS CHRISTELLA HOLY, MD

## 2024-04-03 ENCOUNTER — Ambulatory Visit: Admitting: Internal Medicine

## 2024-04-03 DIAGNOSIS — H25811 Combined forms of age-related cataract, right eye: Secondary | ICD-10-CM | POA: Diagnosis not present

## 2024-04-03 DIAGNOSIS — H40021 Open angle with borderline findings, high risk, right eye: Secondary | ICD-10-CM | POA: Diagnosis not present

## 2024-04-03 DIAGNOSIS — H401221 Low-tension glaucoma, left eye, mild stage: Secondary | ICD-10-CM | POA: Diagnosis not present

## 2024-04-03 DIAGNOSIS — R7309 Other abnormal glucose: Secondary | ICD-10-CM | POA: Diagnosis not present

## 2024-04-03 LAB — OPHTHALMOLOGY REPORT-SCANNED

## 2024-04-07 NOTE — Progress Notes (Signed)
 Update: MUTYH [id number] VUS reclassified to likely benign, Report date is 03/24/2024.

## 2024-04-13 ENCOUNTER — Encounter: Payer: Self-pay | Admitting: Internal Medicine

## 2024-04-14 ENCOUNTER — Other Ambulatory Visit: Payer: Self-pay

## 2024-04-14 ENCOUNTER — Telehealth (INDEPENDENT_AMBULATORY_CARE_PROVIDER_SITE_OTHER): Payer: Self-pay | Admitting: Internal Medicine

## 2024-04-14 DIAGNOSIS — G4733 Obstructive sleep apnea (adult) (pediatric): Secondary | ICD-10-CM

## 2024-04-14 MED ORDER — ZEPBOUND 5 MG/0.5ML ~~LOC~~ SOAJ: 5.0000 mg | SUBCUTANEOUS | 1 refills | Status: DC

## 2024-04-14 NOTE — Telephone Encounter (Signed)
 Patient called to make OV with Dr Cindie ONLY. Please call her at [phone]

## 2024-04-30 ENCOUNTER — Ambulatory Visit: Attending: Cardiovascular Disease | Admitting: Cardiovascular Disease

## 2024-04-30 VITALS — BP 129/85 | HR 82 | Ht 66.0 in | Wt 230.4 lb

## 2024-04-30 DIAGNOSIS — I491 Atrial premature depolarization: Secondary | ICD-10-CM | POA: Diagnosis not present

## 2024-04-30 DIAGNOSIS — E7800 Pure hypercholesterolemia, unspecified: Secondary | ICD-10-CM | POA: Insufficient documentation

## 2024-04-30 DIAGNOSIS — Z923 Personal history of irradiation: Secondary | ICD-10-CM | POA: Insufficient documentation

## 2024-04-30 DIAGNOSIS — I872 Venous insufficiency (chronic) (peripheral): Secondary | ICD-10-CM | POA: Insufficient documentation

## 2024-04-30 DIAGNOSIS — R7303 Prediabetes: Secondary | ICD-10-CM | POA: Diagnosis present

## 2024-04-30 DIAGNOSIS — G4733 Obstructive sleep apnea (adult) (pediatric): Secondary | ICD-10-CM | POA: Diagnosis not present

## 2024-04-30 NOTE — Progress Notes (Unsigned)
 Cardiology Office Note:    Date:  04/30/2024   ID:  Kara [name], DOB [birth year], MRN [id number]  PCP:  Tobie Suzzane POUR, MD   [facility] Providers Cardiologist:  None     Referring MD: Tobie Suzzane POUR, MD   No chief complaint on file.   History of Present Illness:    Kara [name] is a 78 y.o. female with a hx of hyperlipidemia, prediabetes, peripheral venous insufficiency, treated hypothyroidism, left breast cancer status post surgery and external beam radiation (completed April 2021), GERD, anxiety, who has noticed occasional palpitations and has problems with bilateral lower extremity edema.  She continues to be preoccupied by palpitations, but these are quite infrequent and always very brief.  She has never been able to catch them on her smart watch since they are so brief.  They are described as a sudden twinge especially in the mornings.  Previous workup has shown isolated PACs or PVCs and no evidence of structural heart disease on her echocardiogram.  She has been prescribed CPAP for obstructive sleep apnea, but seems skeptical about using it.  She just received the equipment and has not started yet.  She does not have a history of sustained palpitations, known SVT, syncope, valvular heart disease or coronary artery disease.  She does not have hypertension.  She has a smart watch with ECG capability.  We recorded her rhythm during her office visit and she had sustained atrial bigeminy for at least a minute.  She was unaware of the irregular heartbeat.  She felt well during this recording.  Her other big complaint is leg edema.  She tells me that all the women in her family have big legs.  She actually had a venous duplex ultrasound performed a couple of years ago that showed right sided saphenous vein reflux, but the decision was made to treat this conservatively.  She is wearing compression stockings (20-30 mmHg) but continues to have edema.  She tried  taking Lasix  but this caused hypotension and she had to stop it.  It also did not help much with the swelling.  She is now on hydrochlorothiazide , but this is not making much of a difference either.  Her blood pressure tends to run low, as it is today.  She has not had frank syncope.  She denies shortness of breath at rest or with activity.  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  The STAT Breast cancer panel offered by [facility] includes sequencing and rearrangement analysis for the following 9 genes:  ATM, BRCA1, BRCA2, CDH1, CHEK2, PALB2, PTEN, STK11 and TP53.   The report date is August 09, 2019.   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 fibroids   BIOPSY  05/02/2022   Procedure: BIOPSY;  Surgeon: Cindie Carlin POUR, DO;  [facility];  Service: Endoscopy;;   BREAST BIOPSY Left 05/2019   left breast DCIS   BREAST LUMPECTOMY Left 06/2019   DCIS   BREAST LUMPECTOMY WITH RADIOACTIVE SEED LOCALIZATION Left 07/08/2019   Procedure: LEFT BREAST LUMPECTOMY WITH RADIOACTIVE SEED LOCALIZATION;  Surgeon: Curvin Deward MOULD, MD;  Location: [facility];  Service: General;  Laterality: Left;   BREAST SURGERY N/A    Phreesia 08/30/2020   CESAREAN SECTION     CESAREAN SECTION N/A    Phreesia 08/30/2020   CHOLECYSTECTOMY     COLONOSCOPY  2011   [place] : two 3-4 mm sessile polyps, hyperplastic, sigmoid diverticula, TI appeared normal.    COLONOSCOPY WITH PROPOFOL  N/A 05/02/2022   Procedure: COLONOSCOPY WITH PROPOFOL ;  Surgeon: Cindie Carlin POUR, DO;  Location: [facility];  Service: Endoscopy;  Laterality: N/A;  11:30 am   ESOPHAGOGASTRODUODENOSCOPY  2011   [place] : non-bleeding erosive gastropathy, normal duodenum. path: unremarkable duodenum, negative H.pylori, minimal esophagitis    ESOPHAGOGASTRODUODENOSCOPY N/A 11/23/2022   Procedure: ESOPHAGOGASTRODUODENOSCOPY (EGD);  Surgeon: Wilhelmenia Aloha Raddle., MD;  Location: [facility];  Service: Gastroenterology;  Laterality: N/A;   ESOPHAGOGASTRODUODENOSCOPY (EGD) WITH PROPOFOL  N/A 05/02/2022   Procedure: ESOPHAGOGASTRODUODENOSCOPY (EGD) WITH PROPOFOL ;  Surgeon: Cindie Carlin POUR, DO;  [facility];  Service: Endoscopy;  Laterality: N/A;   EUS N/A 11/23/2022   Procedure: UPPER ENDOSCOPIC ULTRASOUND (EUS) RADIAL;  Surgeon: Wilhelmenia Aloha Raddle., MD;  Location: [facility];  Service: Gastroenterology;  Laterality: N/A;   EYE SURGERY  2022   IR RADIOLOGIST EVAL & MGMT  06/20/2023   POLYPECTOMY  05/02/2022   Procedure:  POLYPECTOMY;  Surgeon: Cindie Carlin POUR, DO;  Location: [facility];  Service: Endoscopy;;   REFRACTIVE SURGERY  2024   TONSILLECTOMY  1975    Current Medications: Current Meds  Medication Sig   acetaminophen  (TYLENOL ) 500 MG tablet Take 500 mg by mouth every 8 (eight) hours as needed for moderate pain (pain score 4-6).   albuterol  (VENTOLIN  HFA) 108 (90 Base) MCG/ACT inhaler Inhale 1 puff into the lungs every 6 (six) hours as needed for wheezing or shortness of breath.   Carboxymethylcell-Glycerin PF (REFRESH RELIEVA PF) 0.5-1 % SOLN Place 1 drop into both eyes in the morning, at noon, in the evening, and at bedtime.   clobetasol  (TEMOVATE ) 0.05 % external solution Apply 1 Application topically 2 (two) times daily.   EPINEPHrine  0.3 mg/0.3 mL IJ SOAJ injection Inject 0.3 mg into the muscle as needed for anaphylaxis.   Fluocinolone  Acetonide Body 0.01 % OIL Apply 1 Units topically 2 (two) times daily.  ibuprofen  (ADVIL ) 100 MG/5ML suspension Take 200 mg by mouth every 4 (four) hours as needed.   latanoprost (XALATAN) 0.005 % ophthalmic solution Place 1 drop into both eyes at bedtime.   Misc. Devices MISC Compression stockings XL: 20-30 mm Hg   pantoprazole  (PROTONIX ) 40 MG tablet Take 1 tablet (40 mg total) by mouth daily.   SYNTHROID  112 MCG tablet Take 1 tablet (112 mcg total) by mouth daily before breakfast.   Thiamine  HCl (VITAMIN B-1 PO) Take 1 tablet by mouth in the morning.   tirzepatide  (ZEPBOUND ) 5 MG/0.5ML Pen Inject 5 mg into the skin once a week.   XDEMVY 0.25 % SOLN in the morning and at bedtime. For six weeks     Allergies:   Coconut (cocos nucifera), Other, Prednisone , Iodinated contrast media, and Petrolatum   Social History   Socioeconomic History   Marital status: Divorced    Spouse name: Not on file   Number of children: 2   Years of education: 15   Highest education level: Some college, no degree  Occupational History   Occupation: retired    Comment:  Loss adjuster, chartered at a database administrator  Tobacco Use   Smoking status: Former    Current packs/day: 0.00    Average packs/day: 0.5 packs/day for 15.0 years (7.5 ttl pk-yrs)    Types: Cigarettes    Start date: 06/16/1983    Quit date: 06/15/1998    Years since quitting: 25.8   Smokeless tobacco: Never   Tobacco comments:    quit 2002  Vaping Use   Vaping status: Never Used  Substance and Sexual Activity   Alcohol use: No   Drug use: Never   Sexual activity: Not Currently    Birth control/protection: I.U.D., Pill, Surgical, None    Comment: hyst  Other Topics Concern   Not on file  Social History Narrative   Lives alone   2 grown children- one in [place] and one here   Grandchildren       Moved to [place] from [place] for aging mother who is 78 with Alzheimers      Maternal Aunt is Animator of the [employer] call line      Enjoy: gym, time with friends       Diet: eats all food groups   Caffeine: 2 diet cokes daily    Water : 2-4 cups daily       Wears seat belt    Does not use phone while driving    Smoke and carbon detectors at home   [employer]    No weapons       Right Handed    Lives alone in a one story home    Retired   Social Drivers of Health   Tobacco Use: Medium Risk (04/01/2024)   Patient History    Smoking Tobacco Use: Former    Smokeless Tobacco Use: Never    Passive Exposure: Not on Actuary Strain: Low Risk (12/21/2023)   Overall Financial Resource Strain (CARDIA)    Difficulty of Paying Living Expenses: Not hard at all  Food Insecurity: No Food Insecurity (12/21/2023)   Epic    Worried About Radiation Protection Practitioner of Food in the Last Year: Never true    Ran Out of Food in the Last Year: Never true  Transportation Needs: No Transportation Needs (12/21/2023)   Epic    Lack of Transportation (Medical): No    Lack of Transportation (Non-Medical): No  Physical Activity: Sufficiently Active (12/21/2023)   Exercise Vital  Sign    Days of Exercise per Week: 7 days    Minutes of Exercise per Session: 30 min  Stress: No Stress Concern Present (12/21/2023)   [employer] of Occupational Health - Occupational Stress Questionnaire    Feeling of Stress: Not at all  Social Connections: Moderately Isolated (12/21/2023)   Social Connection and Isolation Panel    Frequency of Communication with Friends and Family: More than three times a week    Frequency of Social Gatherings with Friends and Family: More than three times a week    Attends Religious Services: Patient declined    Active Member of Clubs or Organizations: Yes    Attends Banker Meetings: 1 to 4 times per year    Marital Status: Divorced  Depression (PHQ2-9): Low Risk (12/31/2023)   Depression (PHQ2-9)    PHQ-2 Score: 0  Alcohol Screen: Low Risk (12/21/2023)   Alcohol Screen    Last Alcohol Screening Score (AUDIT): 0  Housing: Low Risk (12/21/2023)   Epic    Unable to Pay for Housing in the Last Year: No    Number of Times Moved in the Last Year: 0    Homeless in the Last Year: No  Utilities: Not At Risk (12/25/2023)   Epic    Threatened with loss of utilities: No  Health Literacy: Adequate Health Literacy (12/25/2023)   B1300 Health Literacy    Frequency of need for help with medical instructions: Never     Family History: The patient's family history includes Arthritis in her mother; Breast cancer in her maternal aunt, maternal grandmother, paternal aunt, and paternal aunt; Breast cancer (age of onset: 82) in her mother; COPD in her mother; Cancer in her father, maternal uncle, and another family member; Cancer (age of onset: 72) in her maternal aunt; Hearing loss in her mother; Hyperlipidemia in her maternal grandmother; Prostate cancer in her maternal grandfather and maternal uncle; Stroke in her father. There is no history of Colon cancer, Allergic rhinitis, Angioedema, Asthma, Atopy, Eczema, Urticaria, or Immunodeficiency.  ROS:    Please see the history of present illness.     All other systems reviewed and are negative.  EKGs/Labs/Other Studies Reviewed:    The following studies were reviewed today: Echocardiogram 05/04/2020  1. Left ventricular ejection fraction, by estimation, is 60 to 65%. The  left ventricle has normal function. The left ventricle has no regional  wall motion abnormalities. There is mild left ventricular hypertrophy.  Left ventricular diastolic parameters  are indeterminate.   2. Right ventricular systolic function is normal. The right ventricular  size is normal. Tricuspid regurgitation signal is inadequate for assessing  PA pressure.   3. The mitral valve is grossly normal. Trivial mitral valve  regurgitation.   4. The aortic valve is tricuspid. Aortic valve regurgitation is not  visualized.   5. The inferior vena cava is normal in size with greater than 50%  respiratory variability, suggesting right atrial pressure of 3 mmHg.   Holter monitor July 2019 48-hour Holter monitor reviewed.  Rhythm is sinus throughout.  Heart rate ranged from 55 bpm up to 132 bpm with average heart rate 81 bpm.  There were rare PACs and PVCs without sustained arrhythmia.  No pauses.  No symptom diary for review.  EKG:     EKG Interpretation Date/Time:    Ventricular Rate:    PR Interval:    QRS Duration:    QT  Interval:    QTC Calculation:   R Axis:      Text Interpretation:         Previous ECG demonstrates normal sinus rhythm, normal tracing other than a borderline short PR interval just under 120 ms.   Recent Labs: 10/16/2023: B Natriuretic Peptide 12.0; Hemoglobin 14.0; Platelets 192 02/22/2024: ALT 18; BUN 15; Creatinine, Ser 0.82; Potassium 4.4; Sodium 139; TSH 4.660  Recent Lipid Panel    Component Value Date/Time   CHOL 212 (H) 02/22/2024 0932   TRIG 78 02/22/2024 0932   HDL 54 02/22/2024 0932   CHOLHDL 3.9 02/22/2024 0932   CHOLHDL 3.0 10/22/2019 0958   VLDL 15 11/24/2016 1018    LDLCALC 144 (H) 02/22/2024 0932   LDLCALC 114 (H) 10/22/2019 0958   12/01/2020 hemoglobin A1c 6.4%  Risk Assessment/Calculations:           Physical Exam:    VS:  BP 129/85 (BP Location: Left Arm)   Pulse 82   Ht 5' 6 (1.676 m)   Wt 230 lb 6.4 oz (104.5 kg)   SpO2 98%   BMI 37.19 kg/m     Wt Readings from Last 3 Encounters:  04/30/24 230 lb 6.4 oz (104.5 kg)  02/26/24 230 lb (104.3 kg)  01/09/24 228 lb 12.8 oz (103.8 kg)     General: Alert, oriented x3, no distress, moderately obese Head: no evidence of trauma, PERRL, EOMI, no exophtalmos or lid lag, no myxedema, no xanthelasma; normal ears, nose and oropharynx Neck: normal jugular venous pulsations and no hepatojugular reflux; brisk carotid pulses without delay and no carotid bruits Chest: clear to auscultation, no signs of consolidation by percussion or palpation, normal fremitus, symmetrical and full respiratory excursions Cardiovascular: normal position and quality of the apical impulse, regular rhythm, normal first and second heart sounds, no murmurs, rubs or gallops Abdomen: no tenderness or distention, no masses by palpation, no abnormal pulsatility or arterial bruits, normal bowel sounds, no hepatosplenomegaly Extremities: bilateral 2+ soft pitting edema halfway up the shins, despite compression stockings Psych: Normal mood and affect   ASSESSMENT:    No diagnosis found.   PLAN:    In order of problems listed above:  Atrial bigeminy: Rarely symptomatic, benign disorder, no evidence of structural heart disease by echocardiography and no evidence of complex atrial arrhythmia (including no atrial fibrillation) by previous arrhythmia monitor.  Offered reassurance, if she desires she can take beta-blockers as needed, but she prefers not to take more medications. OSA: It is possible that untreated obstructive sleep apnea could be contributing to her lower extremity edema due to pulmonary hypertension/cor  pulmonale/right heart failure.  We discussed the pathophysiology of sleep apnea and I recommended compliance with CPAP. Severe obesity: She has dyslipidemia and borderline diabetes mellitus.  Weight loss is encouraged. Tree of XRT for breast cancer including external beam radiation: No symptoms or signs to indicate myocarditis/pericarditis or coronary artery disease. Peripheral venous insufficiency: Continue to wear compression stockings and keep legs elevated as much as possible.  Refer back to [facility] to discuss whether or not her current symptoms justify more invasive approach such as saphenous vein ablation.  I do not think the diuretics are beneficial and could actually cause more harm than good.  Stop the hydrochlorothiazide  since her blood pressure is low. HLP: She has moderate hypercholesterolemia.  She is reluctant to take any medications.  Recommended a coronary calcium score to better risk stratify and recommend lipid-lowering treatment as necessary.  Medication Adjustments/Labs and Tests Ordered: Current medicines are reviewed at length with the patient today.  Concerns regarding medicines are outlined above.  No orders of the defined types were placed in this encounter.  No orders of the defined types were placed in this encounter.   There are no Patient Instructions on file for this visit.   Signed, Jerel Balding, MD  04/30/2024 3:02 PM    [facility]

## 2024-04-30 NOTE — Patient Instructions (Signed)

## 2024-05-01 ENCOUNTER — Other Ambulatory Visit: Payer: Self-pay | Admitting: Internal Medicine

## 2024-05-01 DIAGNOSIS — Z1231 Encounter for screening mammogram for malignant neoplasm of breast: Secondary | ICD-10-CM

## 2024-05-07 ENCOUNTER — Ambulatory Visit: Admitting: Internal Medicine

## 2024-05-13 LAB — CMP14+EGFR
ALT: 11 IU/L (ref 0–32)
AST: 14 IU/L (ref 0–40)
Albumin: 3.7 g/dL — ABNORMAL LOW (ref 3.8–4.8)
Alkaline Phosphatase: 106 IU/L (ref 49–135)
BUN/Creatinine Ratio: 18 (ref 12–28)
BUN: 14 mg/dL (ref 8–27)
Bilirubin Total: 0.5 mg/dL (ref 0.0–1.2)
CO2: 20 mmol/L (ref 20–29)
Calcium: 9.3 mg/dL (ref 8.7–10.3)
Chloride: 107 mmol/L — ABNORMAL HIGH (ref 96–106)
Creatinine, Ser: 0.8 mg/dL (ref 0.57–1.00)
Globulin, Total: 2.8 g/dL (ref 1.5–4.5)
Glucose: 88 mg/dL (ref 70–99)
Potassium: 4.4 mmol/L (ref 3.5–5.2)
Sodium: 140 mmol/L (ref 134–144)
Total Protein: 6.5 g/dL (ref 6.0–8.5)
eGFR: 76 mL/min/1.73

## 2024-05-13 LAB — HEMOGLOBIN A1C
Est. average glucose Bld gHb Est-mCnc: 126 mg/dL
Hgb A1c MFr Bld: 6 % — ABNORMAL HIGH (ref 4.8–5.6)

## 2024-05-15 ENCOUNTER — Encounter: Payer: Self-pay | Admitting: Gastroenterology

## 2024-05-19 ENCOUNTER — Ambulatory Visit (INDEPENDENT_AMBULATORY_CARE_PROVIDER_SITE_OTHER): Admitting: Internal Medicine

## 2024-05-19 ENCOUNTER — Encounter: Payer: Self-pay | Admitting: Internal Medicine

## 2024-05-19 VITALS — BP 128/70 | HR 80 | Ht 66.0 in | Wt 229.6 lb

## 2024-05-19 DIAGNOSIS — G4733 Obstructive sleep apnea (adult) (pediatric): Secondary | ICD-10-CM | POA: Diagnosis not present

## 2024-05-19 DIAGNOSIS — E890 Postprocedural hypothyroidism: Secondary | ICD-10-CM

## 2024-05-19 DIAGNOSIS — I872 Venous insufficiency (chronic) (peripheral): Secondary | ICD-10-CM | POA: Insufficient documentation

## 2024-05-19 DIAGNOSIS — K219 Gastro-esophageal reflux disease without esophagitis: Secondary | ICD-10-CM | POA: Diagnosis not present

## 2024-05-19 MED ORDER — ZEPBOUND 5 MG/0.5ML ~~LOC~~ SOAJ: 5.0000 mg | SUBCUTANEOUS | 1 refills | Status: DC

## 2024-05-19 NOTE — Patient Instructions (Signed)
 Please continue to take medications as prescribed.  Please continue to follow low carb diet and perform moderate exercise/walking at least 150 mins/week.

## 2024-05-19 NOTE — Assessment & Plan Note (Signed)
 Well controlled with diet modification now -takes pantoprazole  40 mg QD as needed

## 2024-05-19 NOTE — Assessment & Plan Note (Signed)
 BMI Readings from Last 3 Encounters:  05/19/24 37.06 kg/m  04/30/24 37.19 kg/m  02/26/24 37.12 kg/m   Associated with OSA and HLD Advised to follow low-carb diet and perform moderate exercise/walking at least 150 minutes/week Continue Zepbound  for OSA

## 2024-05-19 NOTE — Assessment & Plan Note (Signed)
 Has chronic leg swelling Has been evaluated by cardiology and vascular surgery Did not tolerate diuretic due to hypotension Leg elevation and compression socks as tolerated

## 2024-05-19 NOTE — Assessment & Plan Note (Addendum)
 Home sleep study showed moderate OSA Has CPAP device through [facility] pharmacy Has tried nasal mask, but still has difficulty breathing - has been evaluated by Sleep specialist She is a good candidate for Zepbound  - had lengthy discussion of benefits and side effects of Zepbound , patient is interested in continuing Zepbound  at 5 mg dose for now

## 2024-05-19 NOTE — Assessment & Plan Note (Addendum)
 Lab Results  Component Value Date   TSH 4.660 (H) 02/22/2024   On Levothyroxine  112 mcg QD, f/u with Dr Nida

## 2024-05-19 NOTE — Progress Notes (Signed)
 "  Established Patient Office Visit  Subjective:  Patient ID: Kara [name], female    DOB: 1945/07/08  Age: 79 y.o. MRN: [id number]  CC:  Chief Complaint  Patient presents with   Sleep Apnea    Follow up, reports doing well with zepbound  medication.    HPI Kara [name] is a 79 y.o. female with past medical history of GERD and hypothyroidism who presents for f/u of her chronic medical conditions.  She has been taking Synthroid  regularly for her hypothyroidism.  Denies any recent change in weight or appetite.  OSA: Sleep study showed moderate OSA. She received CPAP device, but has suffocation even with nasal mask.  She has started taking Zepbound , tolerates it overall.  She has mild worsening of acid reflux and nausea, but is overall manageable with pantoprazole .  She still complains of persistent leg swelling.  She was told to stop taking HCTZ by her cardiologist.  She had been having dizziness even with HCTZ 12.5 mg every other day. She has tried leg elevation and compression socks without much relief.  She used to use sequential compression device twice in a week, and felt better after using it. She was referred to Vascular surgery by Cardiology for consideration of saphenous vein ablation, but was advised conservative treatment for now. She denies orthopnea or PND currently.  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  The  STAT Breast cancer panel offered by [facility] includes sequencing and rearrangement analysis for the following 9 genes:  ATM, BRCA1, BRCA2, CDH1, CHEK2, PALB2, PTEN, STK11 and TP53.   The report date is August 09, 2019.   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 fibroids   BIOPSY  05/02/2022   Procedure: BIOPSY;  Surgeon: Cindie Carlin POUR, DO;  Location: [facility];  Service: Endoscopy;;   BREAST BIOPSY Left 05/2019   left breast DCIS   BREAST LUMPECTOMY Left 06/2019   DCIS  BREAST LUMPECTOMY WITH RADIOACTIVE SEED LOCALIZATION Left 07/08/2019   Procedure: LEFT BREAST LUMPECTOMY WITH RADIOACTIVE SEED LOCALIZATION;  Surgeon: Curvin Deward MOULD, MD;  Location: [facility];  Service: General;  Laterality: Left;   BREAST SURGERY N/A    [employer] 08/30/2020   CESAREAN SECTION     CESAREAN SECTION N/A    [employer] 08/30/2020   CHOLECYSTECTOMY     COLONOSCOPY  2011   [place] : two 3-4 mm sessile polyps, hyperplastic, sigmoid diverticula, TI appeared normal.    COLONOSCOPY WITH PROPOFOL  N/A 05/02/2022   Procedure: COLONOSCOPY WITH PROPOFOL ;  Surgeon:  Cindie Carlin POUR, DO;  Location: [facility];  Service: Endoscopy;  Laterality: N/A;  11:30 am   ESOPHAGOGASTRODUODENOSCOPY  2011   [place] : non-bleeding erosive gastropathy, normal duodenum. path: unremarkable duodenum, negative H.pylori, minimal esophagitis    ESOPHAGOGASTRODUODENOSCOPY N/A 11/23/2022   Procedure: ESOPHAGOGASTRODUODENOSCOPY (EGD);  Surgeon: Wilhelmenia Aloha Raddle., MD;  Location: [facility];  Service: Gastroenterology;  Laterality: N/A;   ESOPHAGOGASTRODUODENOSCOPY (EGD) WITH PROPOFOL  N/A 05/02/2022   Procedure: ESOPHAGOGASTRODUODENOSCOPY (EGD) WITH PROPOFOL ;  Surgeon: Cindie Carlin POUR, DO;  Location: [facility];  Service: Endoscopy;  Laterality: N/A;   EUS N/A 11/23/2022   Procedure: UPPER ENDOSCOPIC ULTRASOUND (EUS) RADIAL;  Surgeon: Wilhelmenia Aloha Raddle., MD;  Location: [facility];  Service: Gastroenterology;  Laterality: N/A;   EYE SURGERY  2022   IR RADIOLOGIST EVAL & MGMT  06/20/2023   POLYPECTOMY  05/02/2022   Procedure: POLYPECTOMY;  Surgeon: Cindie Carlin POUR, DO;  Location: [facility];  Service: Endoscopy;;   REFRACTIVE SURGERY  2024   TONSILLECTOMY  1975    Family History  Problem Relation Age of Onset   Breast cancer Maternal Grandmother        dx over 2   Hyperlipidemia Maternal Grandmother    Prostate cancer Maternal Grandfather    Arthritis Mother    COPD Mother    Breast cancer Mother 75       second at age 63   Hearing loss Mother    Cancer Father        throat   Stroke Father    Cancer Maternal Aunt 24       [name], cervical cancer   Breast cancer Maternal Aunt        dx over 75   Breast cancer Paternal Aunt        dx under 50   Prostate cancer Maternal Uncle        dx over 1   Cancer Other        father's maternal cousin was Henreietta Lacks, Cervical   Cancer Maternal Uncle        unknown cancer   Breast cancer Paternal Aunt        dx over 78   Colon cancer Neg Hx    Allergic rhinitis Neg Hx    Angioedema  Neg Hx    Asthma Neg Hx    Atopy Neg Hx    Eczema Neg Hx    Urticaria Neg Hx    Immunodeficiency Neg Hx     Social History   Socioeconomic History   Marital status: Divorced    Spouse name: Not on file   Number of children: 2   Years of education: 15   Highest education level: Some college, no degree  Occupational History   Occupation: retired    Comment: Loss adjuster, chartered at a database administrator  Tobacco Use   Smoking status: Former  Current packs/day: 0.00    Average packs/day: 0.5 packs/day for 15.0 years (7.5 ttl pk-yrs)    Types: Cigarettes    Start date: 06/16/1983    Quit date: 06/15/1998    Years since quitting: 25.9   Smokeless tobacco: Never   Tobacco comments:    quit 2002  Vaping Use   Vaping status: Never Used  Substance and Sexual Activity   Alcohol use: No   Drug use: Never   Sexual activity: Not Currently    Birth control/protection: I.U.D., Pill, Surgical, None    Comment: hyst  Other Topics Concern   Not on file  Social History Narrative   Lives alone   2 grown children- one in [place] and one here   Grandchildren       Moved to [place] from [place] for aging mother who is 34 with Alzheimers      Maternal Aunt is Animator of the [employer] call line      Enjoy: gym, time with friends       Diet: eats all food groups   Caffeine: 2 diet cokes daily    Water : 2-4 cups daily       Wears seat belt    Does not use phone while driving    Smoke and carbon detectors at home   [employer]    No weapons       Right Handed    Lives alone in a one story home    Retired   Social Drivers of Health   Tobacco Use: Medium Risk (05/19/2024)   Patient History    Smoking Tobacco Use: Former    Smokeless Tobacco Use: Never    Passive Exposure: Not on Actuary Strain: Low Risk (05/15/2024)   Overall Financial Resource Strain (CARDIA)    Difficulty of Paying Living Expenses: Not hard at all  Food Insecurity: No  Food Insecurity (05/15/2024)   Epic    Worried About Radiation Protection Practitioner of Food in the Last Year: Never true    Ran Out of Food in the Last Year: Never true  Transportation Needs: No Transportation Needs (05/15/2024)   Epic    Lack of Transportation (Medical): No    Lack of Transportation (Non-Medical): No  Physical Activity: Sufficiently Active (05/15/2024)   Exercise Vital Sign    Days of Exercise per Week: 6 days    Minutes of Exercise per Session: 30 min  Stress: No Stress Concern Present (05/15/2024)   [employer] of Occupational Health - Occupational Stress Questionnaire    Feeling of Stress: Not at all  Social Connections: Moderately Integrated (05/15/2024)   Social Connection and Isolation Panel    Frequency of Communication with Friends and Family: More than three times a week    Frequency of Social Gatherings with Friends and Family: More than three times a week    Attends Religious Services: More than 4 times per year    Active Member of Clubs or Organizations: Yes    Attends Banker Meetings: More than 4 times per year    Marital Status: Divorced  Intimate Partner Violence: Not At Risk (12/25/2023)   Epic    Fear of Current or Ex-Partner: No    Emotionally Abused: No    Physically Abused: No    Sexually Abused: No  Depression (PHQ2-9): Low Risk (05/19/2024)   Depression (PHQ2-9)    PHQ-2 Score: 0  Alcohol Screen: Low Risk (12/21/2023)   Alcohol Screen  Last Alcohol Screening Score (AUDIT): 0  Housing: Low Risk (05/15/2024)   Epic    Unable to Pay for Housing in the Last Year: No    Number of Times Moved in the Last Year: 0    Homeless in the Last Year: No  Utilities: Not At Risk (12/25/2023)   Epic    Threatened with loss of utilities: No  Health Literacy: Adequate Health Literacy (12/25/2023)   B1300 Health Literacy    Frequency of need for help with medical instructions: Never    Outpatient Medications Prior to Visit  Medication Sig Dispense Refill    acetaminophen  (TYLENOL ) 500 MG tablet Take 500 mg by mouth every 8 (eight) hours as needed for moderate pain (pain score 4-6).     albuterol  (VENTOLIN  HFA) 108 (90 Base) MCG/ACT inhaler Inhale 1 puff into the lungs every 6 (six) hours as needed for wheezing or shortness of breath. 18 g 2   Carboxymethylcell-Glycerin PF (REFRESH RELIEVA PF) 0.5-1 % SOLN Place 1 drop into both eyes in the morning, at noon, in the evening, and at bedtime.     clobetasol  (TEMOVATE ) 0.05 % external solution Apply 1 Application topically 2 (two) times daily. 50 mL 1   EPINEPHrine  0.3 mg/0.3 mL IJ SOAJ injection Inject 0.3 mg into the muscle as needed for anaphylaxis. 1 each 2   Fluocinolone  Acetonide Body 0.01 % OIL Apply 1 Units topically 2 (two) times daily. 120 mL 5   ibuprofen  (ADVIL ) 100 MG/5ML suspension Take 200 mg by mouth every 4 (four) hours as needed.     latanoprost (XALATAN) 0.005 % ophthalmic solution Place 1 drop into both eyes at bedtime.     Misc. Devices MISC Compression stockings XL: 20-30 mm Hg 1 each 0   pantoprazole  (PROTONIX ) 40 MG tablet Take 1 tablet (40 mg total) by mouth daily. 90 tablet 3   SYNTHROID  112 MCG tablet Take 1 tablet (112 mcg total) by mouth daily before breakfast. 90 tablet 1   Thiamine  HCl (VITAMIN B-1 PO) Take 1 tablet by mouth in the morning.     XDEMVY 0.25 % SOLN in the morning and at bedtime. For six weeks     tirzepatide  (ZEPBOUND ) 5 MG/0.5ML Pen Inject 5 mg into the skin once a week. 2 mL 1   No facility-administered medications prior to visit.    Allergies  Allergen Reactions   Coconut (Cocos Nucifera) Anaphylaxis   Other Shortness Of Breath    Covid booster pfizer     Prednisone  Hypertension    Severe headache   Iodinated Contrast Media Swelling    Pt reported throat swelling post-procedure that resolved with Benadryl  at home   Petrolatum Dermatitis    Caused incision irritation     ROS Review of Systems  Constitutional:  Positive for fatigue. Negative  for chills and fever.  HENT:  Negative for congestion, sinus pressure, sinus pain and sore throat.   Eyes:  Negative for pain and discharge.  Respiratory:  Negative for cough and shortness of breath.   Cardiovascular:  Positive for leg swelling. Negative for chest pain and palpitations.  Gastrointestinal:  Negative for abdominal pain, constipation, diarrhea, nausea and vomiting.  Endocrine: Negative for polydipsia and polyuria.  Genitourinary:  Negative for dysuria and hematuria.  Musculoskeletal:  Positive for arthralgias and back pain. Negative for neck pain and neck stiffness.  Skin:  Negative for rash.  Neurological:  Positive for numbness (and tingling of LE). Negative for dizziness and weakness.  Psychiatric/Behavioral:  Negative for agitation and behavioral problems.       Objective:    Physical Exam Vitals reviewed.  Constitutional:      General: She is not in acute distress.    Appearance: She is obese. She is not diaphoretic.  HENT:     Head: Normocephalic and atraumatic.     Nose: Nose normal. No congestion.     Mouth/Throat:     Mouth: Mucous membranes are moist.     Pharynx: No posterior oropharyngeal erythema.  Eyes:     General: No scleral icterus.    Extraocular Movements: Extraocular movements intact.  Cardiovascular:     Rate and Rhythm: Normal rate and regular rhythm.     Heart sounds: Normal heart sounds. No murmur heard. Pulmonary:     Breath sounds: Normal breath sounds. No wheezing or rales.  Musculoskeletal:     Cervical back: Neck supple. No tenderness.     Right lower leg: Edema (2+) present.     Left lower leg: Edema (2+) present.  Skin:    General: Skin is warm.     Findings: No rash.  Neurological:     General: No focal deficit present.     Mental Status: She is alert and oriented to person, place, and time.     Sensory: Sensory deficit (B/l LE) present.     Motor: No weakness.  Psychiatric:        Mood and Affect: Mood normal.         Behavior: Behavior normal.     BP 128/70   Pulse 80   Ht 5' 6 (1.676 m)   Wt 229 lb 9.6 oz (104.1 kg)   SpO2 95%   BMI 37.06 kg/m  Wt Readings from Last 3 Encounters:  05/19/24 229 lb 9.6 oz (104.1 kg)  04/30/24 230 lb 6.4 oz (104.5 kg)  02/26/24 230 lb (104.3 kg)    Lab Results  Component Value Date   TSH 4.660 (H) 02/22/2024   Lab Results  Component Value Date   WBC 6.8 10/16/2023   HGB 14.0 10/16/2023   HCT 43.7 10/16/2023   MCV 81.7 10/16/2023   PLT 192 10/16/2023   Lab Results  Component Value Date   NA 140 05/12/2024   K 4.4 05/12/2024   CO2 20 05/12/2024   GLUCOSE 88 05/12/2024   BUN 14 05/12/2024   CREATININE 0.80 05/12/2024   BILITOT 0.5 05/12/2024   ALKPHOS 106 05/12/2024   AST 14 05/12/2024   ALT 11 05/12/2024   PROT 6.5 05/12/2024   ALBUMIN 3.7 (L) 05/12/2024   CALCIUM 9.3 05/12/2024   ANIONGAP 8 10/16/2023   EGFR 76 05/12/2024   Lab Results  Component Value Date   CHOL 212 (H) 02/22/2024   Lab Results  Component Value Date   HDL 54 02/22/2024   Lab Results  Component Value Date   LDLCALC 144 (H) 02/22/2024   Lab Results  Component Value Date   TRIG 78 02/22/2024   Lab Results  Component Value Date   CHOLHDL 3.9 02/22/2024   Lab Results  Component Value Date   HGBA1C 6.0 (H) 05/12/2024      Assessment & Plan:   Problem List Items Addressed This Visit       Cardiovascular and Mediastinum   Peripheral venous insufficiency   Has chronic leg swelling Has been evaluated by cardiology and vascular surgery Did not tolerate diuretic due to hypotension Leg elevation and compression socks as tolerated  Respiratory   OSA (obstructive sleep apnea) - Primary   Home sleep study showed moderate OSA Has CPAP device [facility] pharmacy Has tried nasal mask, but still has difficulty breathing - has been evaluated by Sleep specialist She is a good candidate for Zepbound  - had lengthy discussion of benefits and side  effects of Zepbound , patient is interested in continuing Zepbound  at 5 mg dose for now      Relevant Medications   tirzepatide  (ZEPBOUND ) 5 MG/0.5ML Pen     Digestive   GERD (gastroesophageal reflux disease)   Well controlled with diet modification now -takes pantoprazole  40 mg QD as needed        Endocrine   Hypothyroidism following radioiodine therapy   Lab Results  Component Value Date   TSH 4.660 (H) 02/22/2024   On Levothyroxine  112 mcg QD, f/u with Dr Lenis        Other   Morbid obesity (HCC)   BMI Readings from Last 3 Encounters:  05/19/24 37.06 kg/m  04/30/24 37.19 kg/m  02/26/24 37.12 kg/m   Associated with OSA and HLD Advised to follow low-carb diet and perform moderate exercise/walking at least 150 minutes/week Continue Zepbound  for OSA      Relevant Medications   tirzepatide  (ZEPBOUND ) 5 MG/0.5ML Pen        Meds ordered this encounter  Medications   tirzepatide  (ZEPBOUND ) 5 MG/0.5ML Pen    Sig: Inject 5 mg into the skin once a week.    Dispense:  2 mL    Refill:  1    Follow-up: Return in about 4 months (around 09/16/2024) for OSA.    Suzzane MARLA Blanch, MD "

## 2024-05-23 ENCOUNTER — Encounter: Payer: Self-pay | Admitting: Internal Medicine

## 2024-06-01 ENCOUNTER — Encounter: Payer: Self-pay | Admitting: "Endocrinology

## 2024-06-01 DIAGNOSIS — E890 Postprocedural hypothyroidism: Secondary | ICD-10-CM

## 2024-06-02 ENCOUNTER — Encounter: Payer: Self-pay | Admitting: Dermatology

## 2024-06-02 ENCOUNTER — Ambulatory Visit: Admitting: Dermatology

## 2024-06-02 VITALS — BP 137/77 | HR 82

## 2024-06-02 DIAGNOSIS — L820 Inflamed seborrheic keratosis: Secondary | ICD-10-CM

## 2024-06-02 DIAGNOSIS — L905 Scar conditions and fibrosis of skin: Secondary | ICD-10-CM | POA: Diagnosis not present

## 2024-06-02 DIAGNOSIS — D485 Neoplasm of uncertain behavior of skin: Secondary | ICD-10-CM | POA: Diagnosis not present

## 2024-06-02 MED ORDER — SYNTHROID 112 MCG PO TABS: 112.0000 ug | ORAL_TABLET | Freq: Every day | ORAL | 1 refills | Status: AC

## 2024-06-02 NOTE — Progress Notes (Signed)
" ° °  Follow Up Visit   Subjective  Kara [name] is a 79 y.o. female who presents for the following: Spot Check  Patient states she has a spot located at her left breast that she would like to have examined. She has it treated with cryotherapy previously and feels like it has done well. Patient reports the areas have been there for 3 years. She reports the areas are bothersome, reporting itchiness and scabs over. Patient reports she has previously been treated for these areas. Patient reports Hx of bx. Patient denies family history of skin cancer(s).  The following portions of the chart were reviewed this encounter and updated as appropriate: medications, allergies, medical history  Review of Systems:  No other skin or systemic complaints except as noted in HPI or Assessment and Plan.  Objective  Well appearing patient in no apparent distress; mood and affect are within normal limits.  A focused examination was performed of the following areas: Left breast   Relevant exam findings are noted in the Assessment and Plan.  Neck - Posterior 5mm irrgular shaped brown papule    Assessment & Plan   Scar- Left breast - s/p cryo for ISK - Healed well  NEOPLASM OF UNCERTAIN BEHAVIOR OF SKIN Neck - Posterior - Skin / nail biopsy Type of biopsy: tangential   Informed consent: discussed and consent obtained   Timeout: patient name, date of birth, surgical site, and procedure verified   Procedure prep:  Patient was prepped and draped in usual sterile fashion Prep type:  Chlorhexidine  Anesthesia: the lesion was anesthetized in a standard fashion   Anesthetic:  1% lidocaine  w/ epinephrine  1-100,000 buffered w/ 8.4% NaHCO3 Instrument used: DermaBlade   Hemostasis achieved with: aluminum chloride   Outcome: patient tolerated procedure well   Post-procedure details: sterile dressing applied and wound care instructions given   Dressing type: bandage    Specimen 1 - Surgical  pathology Differential Diagnosis: NMSC vs DN vs Other  Check Margins: No  Return if symptoms worsen or fail to improve.  LILLETTE Virgle Boards, Mohs/Dermatology Tech am acting as a neurosurgeon for RUFUS CHRISTELLA HOLY, MD.   Documentation: I have reviewed the above documentation for accuracy and completeness, and I agree with the above.  RUFUS CHRISTELLA HOLY, MD         "

## 2024-06-02 NOTE — Patient Instructions (Addendum)

## 2024-06-03 ENCOUNTER — Ambulatory Visit: Payer: Self-pay | Admitting: Dermatology

## 2024-06-03 LAB — SURGICAL PATHOLOGY

## 2024-06-10 ENCOUNTER — Ambulatory Visit: Admitting: Gastroenterology

## 2024-06-16 ENCOUNTER — Ambulatory Visit

## 2024-06-19 ENCOUNTER — Encounter: Payer: Self-pay | Admitting: Internal Medicine

## 2024-06-19 ENCOUNTER — Other Ambulatory Visit: Payer: Self-pay | Admitting: Internal Medicine

## 2024-06-19 DIAGNOSIS — G4733 Obstructive sleep apnea (adult) (pediatric): Secondary | ICD-10-CM

## 2024-06-19 MED ORDER — ZEPBOUND 7.5 MG/0.5ML ~~LOC~~ SOAJ: 7.5000 mg | SUBCUTANEOUS | 2 refills | Status: DC

## 2024-06-20 ENCOUNTER — Other Ambulatory Visit: Payer: Self-pay

## 2024-06-20 DIAGNOSIS — G4733 Obstructive sleep apnea (adult) (pediatric): Secondary | ICD-10-CM

## 2024-06-20 MED ORDER — ZEPBOUND 7.5 MG/0.5ML ~~LOC~~ SOAJ: 7.5000 mg | SUBCUTANEOUS | 2 refills | Status: AC

## 2024-06-26 ENCOUNTER — Ambulatory Visit

## 2024-07-03 ENCOUNTER — Ambulatory Visit: Admitting: Gastroenterology

## 2024-08-26 ENCOUNTER — Ambulatory Visit: Admitting: "Endocrinology

## 2024-09-16 ENCOUNTER — Ambulatory Visit: Payer: Self-pay | Admitting: Internal Medicine

## 2024-12-29 ENCOUNTER — Ambulatory Visit
